# Patient Record
Sex: Male | Born: 1975
Health system: Southern US, Community
[De-identification: ages and names within clinical notes are randomized; demographics above are authoritative.]

## PROBLEM LIST (undated history)

## (undated) ENCOUNTER — Emergency Department (HOSPITAL_BASED_OUTPATIENT_CLINIC_OR_DEPARTMENT_OTHER): Admission: EM | Payer: 59 | Source: Home / Self Care

## (undated) DIAGNOSIS — I871 Compression of vein: Secondary | ICD-10-CM

## (undated) DIAGNOSIS — D0310 Melanoma in situ of unspecified eyelid, including canthus: Secondary | ICD-10-CM

## (undated) DIAGNOSIS — N2581 Secondary hyperparathyroidism of renal origin: Secondary | ICD-10-CM

## (undated) DIAGNOSIS — E785 Hyperlipidemia, unspecified: Secondary | ICD-10-CM

## (undated) DIAGNOSIS — I10 Essential (primary) hypertension: Secondary | ICD-10-CM

## (undated) DIAGNOSIS — M109 Gout, unspecified: Secondary | ICD-10-CM

## (undated) DIAGNOSIS — I671 Cerebral aneurysm, nonruptured: Secondary | ICD-10-CM

## (undated) DIAGNOSIS — S065XAA Traumatic subdural hemorrhage with loss of consciousness status unknown, initial encounter: Secondary | ICD-10-CM

## (undated) DIAGNOSIS — E786 Lipoprotein deficiency: Secondary | ICD-10-CM

## (undated) DIAGNOSIS — I6201 Nontraumatic acute subdural hemorrhage: Secondary | ICD-10-CM

## (undated) DIAGNOSIS — D630 Anemia in neoplastic disease: Secondary | ICD-10-CM

## (undated) DIAGNOSIS — J189 Pneumonia, unspecified organism: Secondary | ICD-10-CM

## (undated) DIAGNOSIS — K219 Gastro-esophageal reflux disease without esophagitis: Secondary | ICD-10-CM

## (undated) DIAGNOSIS — E559 Vitamin D deficiency, unspecified: Secondary | ICD-10-CM

## (undated) DIAGNOSIS — D509 Iron deficiency anemia, unspecified: Secondary | ICD-10-CM

## (undated) DIAGNOSIS — Q613 Polycystic kidney, unspecified: Secondary | ICD-10-CM

## (undated) DIAGNOSIS — L299 Pruritus, unspecified: Secondary | ICD-10-CM

## (undated) DIAGNOSIS — Z9289 Personal history of other medical treatment: Secondary | ICD-10-CM

## (undated) DIAGNOSIS — N189 Chronic kidney disease, unspecified: Secondary | ICD-10-CM

## (undated) DIAGNOSIS — E669 Obesity, unspecified: Secondary | ICD-10-CM

## (undated) DIAGNOSIS — G4730 Sleep apnea, unspecified: Secondary | ICD-10-CM

## (undated) DIAGNOSIS — G4733 Obstructive sleep apnea (adult) (pediatric): Secondary | ICD-10-CM

## (undated) HISTORY — DX: Anemia in neoplastic disease: D63.0

## (undated) HISTORY — DX: Pruritus, unspecified: L29.9

## (undated) HISTORY — DX: Secondary hyperparathyroidism of renal origin: N25.81

## (undated) HISTORY — DX: Iron deficiency anemia, unspecified: D50.9

## (undated) HISTORY — DX: Nontraumatic acute subdural hemorrhage: I62.01

## (undated) HISTORY — DX: Chronic kidney disease, unspecified: N18.9

## (undated) HISTORY — DX: Disorder of phosphorus metabolism, unspecified: E83.30

## (undated) HISTORY — DX: Melanoma in situ of unspecified eyelid, including canthus: D03.10

## (undated) HISTORY — DX: Essential (primary) hypertension: I10

## (undated) HISTORY — DX: Obesity, unspecified: E66.9

## (undated) HISTORY — DX: Lipoprotein deficiency: E78.6

## (undated) HISTORY — DX: Compression of vein: I87.1

---
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
## 2006-12-06 ENCOUNTER — Emergency Department (HOSPITAL_COMMUNITY): Admission: EM | Admit: 2006-12-06 | Discharge: 2006-12-06 | Payer: Self-pay | Admitting: Emergency Medicine

## 2007-02-04 ENCOUNTER — Ambulatory Visit: Payer: Self-pay | Admitting: Internal Medicine

## 2007-04-17 ENCOUNTER — Ambulatory Visit: Payer: Self-pay | Admitting: *Deleted

## 2008-04-07 ENCOUNTER — Ambulatory Visit: Payer: Self-pay | Admitting: Internal Medicine

## 2008-05-16 ENCOUNTER — Emergency Department (HOSPITAL_COMMUNITY): Admission: EM | Admit: 2008-05-16 | Discharge: 2008-05-16 | Payer: Self-pay | Admitting: Emergency Medicine

## 2008-10-26 ENCOUNTER — Emergency Department (HOSPITAL_COMMUNITY): Admission: EM | Admit: 2008-10-26 | Discharge: 2008-10-26 | Payer: Self-pay | Admitting: Emergency Medicine

## 2008-12-28 ENCOUNTER — Encounter (INDEPENDENT_AMBULATORY_CARE_PROVIDER_SITE_OTHER): Payer: Self-pay | Admitting: Adult Health

## 2008-12-28 ENCOUNTER — Ambulatory Visit: Payer: Self-pay | Admitting: Internal Medicine

## 2008-12-28 LAB — CONVERTED CEMR LAB
ALT: 28 units/L (ref 0–53)
AST: 27 units/L (ref 0–37)
Albumin: 4.5 g/dL (ref 3.5–5.2)
Alkaline Phosphatase: 56 units/L (ref 39–117)
BUN: 21 mg/dL (ref 6–23)
CO2: 19 meq/L (ref 19–32)
Calcium: 9.7 mg/dL (ref 8.4–10.5)
Chloride: 106 meq/L (ref 96–112)
Creatinine, Ser: 1.11 mg/dL (ref 0.40–1.50)
Glucose, Bld: 90 mg/dL (ref 70–99)
Microalb, Ur: 3.66 mg/dL — ABNORMAL HIGH (ref 0.00–1.89)
Potassium: 4.3 meq/L (ref 3.5–5.3)
Sodium: 138 meq/L (ref 135–145)
Total Bilirubin: 0.4 mg/dL (ref 0.3–1.2)
Total Protein: 7.6 g/dL (ref 6.0–8.3)

## 2009-04-04 ENCOUNTER — Encounter (INDEPENDENT_AMBULATORY_CARE_PROVIDER_SITE_OTHER): Payer: Self-pay | Admitting: Adult Health

## 2009-04-04 ENCOUNTER — Ambulatory Visit: Payer: Self-pay | Admitting: Internal Medicine

## 2009-04-04 LAB — CONVERTED CEMR LAB
ALT: 68 units/L — ABNORMAL HIGH (ref 0–53)
AST: 35 units/L (ref 0–37)
Albumin: 4.3 g/dL (ref 3.5–5.2)
Alkaline Phosphatase: 55 units/L (ref 39–117)
BUN: 16 mg/dL (ref 6–23)
CO2: 23 meq/L (ref 19–32)
Calcium: 9.3 mg/dL (ref 8.4–10.5)
Chloride: 103 meq/L (ref 96–112)
Cholesterol: 223 mg/dL — ABNORMAL HIGH (ref 0–200)
Creatinine, Ser: 1.14 mg/dL (ref 0.40–1.50)
Glucose, Bld: 98 mg/dL (ref 70–99)
HDL: 41 mg/dL (ref >39)
LDL Cholesterol: 121 mg/dL — ABNORMAL HIGH (ref 0–99)
Potassium: 4.6 meq/L (ref 3.5–5.3)
Sodium: 137 meq/L (ref 135–145)
Total Bilirubin: 0.5 mg/dL (ref 0.3–1.2)
Total CHOL/HDL Ratio: 5.4
Total Protein: 7.6 g/dL (ref 6.0–8.3)
Triglycerides: 303 mg/dL — ABNORMAL HIGH (ref <150)
VLDL: 61 mg/dL — ABNORMAL HIGH (ref 0–40)

## 2009-05-23 ENCOUNTER — Encounter (INDEPENDENT_AMBULATORY_CARE_PROVIDER_SITE_OTHER): Payer: Self-pay | Admitting: Adult Health

## 2009-05-23 ENCOUNTER — Ambulatory Visit: Payer: Self-pay | Admitting: Internal Medicine

## 2009-05-23 LAB — CONVERTED CEMR LAB
Chlamydia, Swab/Urine, PCR: NEGATIVE
GC Probe Amp, Urine: NEGATIVE

## 2009-05-24 ENCOUNTER — Encounter (INDEPENDENT_AMBULATORY_CARE_PROVIDER_SITE_OTHER): Payer: Self-pay | Admitting: Adult Health

## 2009-07-26 ENCOUNTER — Encounter (INDEPENDENT_AMBULATORY_CARE_PROVIDER_SITE_OTHER): Payer: Self-pay | Admitting: Adult Health

## 2009-07-26 ENCOUNTER — Ambulatory Visit: Payer: Self-pay | Admitting: Family Medicine

## 2009-07-26 LAB — CONVERTED CEMR LAB
ALT: 32 units/L (ref 0–53)
AST: 25 units/L (ref 0–37)
Albumin: 4.6 g/dL (ref 3.5–5.2)
Alkaline Phosphatase: 54 units/L (ref 39–117)
BUN: 21 mg/dL (ref 6–23)
CO2: 22 meq/L (ref 19–32)
Calcium: 9.1 mg/dL (ref 8.4–10.5)
Chloride: 106 meq/L (ref 96–112)
Cholesterol: 204 mg/dL — ABNORMAL HIGH (ref 0–200)
Creatinine, Ser: 1.4 mg/dL (ref 0.40–1.50)
Glucose, Bld: 98 mg/dL (ref 70–99)
HDL: 34 mg/dL — ABNORMAL LOW (ref >39)
LDL Cholesterol: 105 mg/dL — ABNORMAL HIGH (ref 0–99)
Potassium: 4.2 meq/L (ref 3.5–5.3)
Sodium: 138 meq/L (ref 135–145)
Total Bilirubin: 0.2 mg/dL — ABNORMAL LOW (ref 0.3–1.2)
Total CHOL/HDL Ratio: 6
Total Protein: 7.6 g/dL (ref 6.0–8.3)
Triglycerides: 324 mg/dL — ABNORMAL HIGH (ref <150)
VLDL: 65 mg/dL — ABNORMAL HIGH (ref 0–40)

## 2009-11-29 ENCOUNTER — Emergency Department (HOSPITAL_COMMUNITY): Admission: EM | Admit: 2009-11-29 | Discharge: 2009-11-29 | Payer: Self-pay | Admitting: Emergency Medicine

## 2009-12-01 ENCOUNTER — Emergency Department (HOSPITAL_COMMUNITY): Admission: EM | Admit: 2009-12-01 | Discharge: 2009-12-02 | Payer: Self-pay | Admitting: Emergency Medicine

## 2009-12-05 ENCOUNTER — Inpatient Hospital Stay (HOSPITAL_COMMUNITY): Admission: EM | Admit: 2009-12-05 | Discharge: 2009-12-09 | Payer: Self-pay | Admitting: Emergency Medicine

## 2009-12-19 ENCOUNTER — Encounter (INDEPENDENT_AMBULATORY_CARE_PROVIDER_SITE_OTHER): Payer: Self-pay | Admitting: *Deleted

## 2009-12-19 LAB — CONVERTED CEMR LAB
ALT: 51 units/L (ref 0–53)
AST: 21 units/L (ref 0–37)
Albumin: 4.1 g/dL (ref 3.5–5.2)
Alkaline Phosphatase: 72 units/L (ref 39–117)
BUN: 18 mg/dL (ref 6–23)
Basophils Absolute: 0.1 10*3/uL (ref 0.0–0.1)
Basophils Relative: 1 % (ref 0–1)
CO2: 26 meq/L (ref 19–32)
Calcium: 9.7 mg/dL (ref 8.4–10.5)
Chloride: 103 meq/L (ref 96–112)
Creatinine, Ser: 1.32 mg/dL (ref 0.40–1.50)
Eosinophils Absolute: 0.3 10*3/uL (ref 0.0–0.7)
Eosinophils Relative: 3 % (ref 0–5)
Glucose, Bld: 80 mg/dL (ref 70–99)
HCT: 38.1 % — ABNORMAL LOW (ref 39.0–52.0)
Hemoglobin: 12.2 g/dL — ABNORMAL LOW (ref 13.0–17.0)
Lymphocytes Relative: 33 % (ref 12–46)
Lymphs Abs: 3.4 10*3/uL (ref 0.7–4.0)
MCHC: 32 g/dL (ref 30.0–36.0)
MCV: 86.8 fL (ref 78.0–100.0)
Monocytes Absolute: 0.6 10*3/uL (ref 0.1–1.0)
Monocytes Relative: 6 % (ref 3–12)
Neutro Abs: 6 10*3/uL (ref 1.7–7.7)
Neutrophils Relative %: 58 % (ref 43–77)
Platelets: 618 10*3/uL — ABNORMAL HIGH (ref 150–400)
Potassium: 4.4 meq/L (ref 3.5–5.3)
RBC: 4.39 M/uL (ref 4.22–5.81)
RDW: 14.5 % (ref 11.5–15.5)
Sodium: 140 meq/L (ref 135–145)
Total Bilirubin: 0.5 mg/dL (ref 0.3–1.2)
Total Protein: 7.7 g/dL (ref 6.0–8.3)
WBC: 10.3 10*3/uL (ref 4.0–10.5)

## 2010-06-01 LAB — DIFFERENTIAL
Basophils Absolute: 0 10*3/uL (ref 0.0–0.1)
Basophils Absolute: 0.3 10*3/uL — ABNORMAL HIGH (ref 0.0–0.1)
Basophils Absolute: 0 10*3/uL (ref 0.0–0.1)
Basophils Relative: 0 % (ref 0–1)
Basophils Relative: 1 % (ref 0–1)
Basophils Relative: 0 % (ref 0–1)
Eosinophils Absolute: 0 10*3/uL (ref 0.0–0.7)
Eosinophils Absolute: 0.1 10*3/uL (ref 0.0–0.7)
Eosinophils Absolute: 0.1 10*3/uL (ref 0.0–0.7)
Eosinophils Relative: 0 % (ref 0–5)
Eosinophils Relative: 1 % (ref 0–5)
Eosinophils Relative: 1 % (ref 0–5)
Lymphocytes Relative: 13 % (ref 12–46)
Lymphocytes Relative: 26 % (ref 12–46)
Lymphocytes Relative: 15 % (ref 12–46)
Lymphs Abs: 2.6 10*3/uL (ref 0.7–4.0)
Lymphs Abs: 4.1 10*3/uL — ABNORMAL HIGH (ref 0.7–4.0)
Lymphs Abs: 1.8 10*3/uL (ref 0.7–4.0)
Monocytes Absolute: 1.4 10*3/uL — ABNORMAL HIGH (ref 0.1–1.0)
Monocytes Absolute: 1.8 10*3/uL — ABNORMAL HIGH (ref 0.1–1.0)
Monocytes Absolute: 0.8 10*3/uL (ref 0.1–1.0)
Monocytes Relative: 9 % (ref 3–12)
Monocytes Relative: 9 % (ref 3–12)
Monocytes Relative: 6 % (ref 3–12)
Neutro Abs: 10.2 10*3/uL — ABNORMAL HIGH (ref 1.7–7.7)
Neutro Abs: 15.7 10*3/uL — ABNORMAL HIGH (ref 1.7–7.7)
Neutro Abs: 9.1 10*3/uL — ABNORMAL HIGH (ref 1.7–7.7)
Neutrophils Relative %: 64 % (ref 43–77)
Neutrophils Relative %: 77 % (ref 43–77)
Neutrophils Relative %: 77 % (ref 43–77)

## 2010-06-01 LAB — COMPREHENSIVE METABOLIC PANEL
ALT: 182 U/L — ABNORMAL HIGH (ref 0–53)
ALT: 34 U/L (ref 0–53)
ALT: 64 U/L — ABNORMAL HIGH (ref 0–53)
ALT: 69 U/L — ABNORMAL HIGH (ref 0–53)
ALT: 76 U/L — ABNORMAL HIGH (ref 0–53)
ALT: 98 U/L — ABNORMAL HIGH (ref 0–53)
ALT: 23 U/L (ref 0–53)
AST: 172 U/L — ABNORMAL HIGH (ref 0–37)
AST: 34 U/L (ref 0–37)
AST: 52 U/L — ABNORMAL HIGH (ref 0–37)
AST: 59 U/L — ABNORMAL HIGH (ref 0–37)
AST: 61 U/L — ABNORMAL HIGH (ref 0–37)
AST: 99 U/L — ABNORMAL HIGH (ref 0–37)
AST: 21 U/L (ref 0–37)
Albumin: 2.6 g/dL — ABNORMAL LOW (ref 3.5–5.2)
Albumin: 2.6 g/dL — ABNORMAL LOW (ref 3.5–5.2)
Albumin: 2.7 g/dL — ABNORMAL LOW (ref 3.5–5.2)
Albumin: 2.8 g/dL — ABNORMAL LOW (ref 3.5–5.2)
Albumin: 3.3 g/dL — ABNORMAL LOW (ref 3.5–5.2)
Albumin: 3.5 g/dL (ref 3.5–5.2)
Albumin: 3.8 g/dL (ref 3.5–5.2)
Alkaline Phosphatase: 121 U/L — ABNORMAL HIGH (ref 39–117)
Alkaline Phosphatase: 128 U/L — ABNORMAL HIGH (ref 39–117)
Alkaline Phosphatase: 71 U/L (ref 39–117)
Alkaline Phosphatase: 84 U/L (ref 39–117)
Alkaline Phosphatase: 93 U/L (ref 39–117)
Alkaline Phosphatase: 99 U/L (ref 39–117)
Alkaline Phosphatase: 48 U/L (ref 39–117)
BUN: 11 mg/dL (ref 6–23)
BUN: 12 mg/dL (ref 6–23)
BUN: 13 mg/dL (ref 6–23)
BUN: 14 mg/dL (ref 6–23)
BUN: 15 mg/dL (ref 6–23)
BUN: 16 mg/dL (ref 6–23)
BUN: 14 mg/dL (ref 6–23)
CO2: 23 mEq/L (ref 19–32)
CO2: 25 mEq/L (ref 19–32)
CO2: 25 mEq/L (ref 19–32)
CO2: 25 mEq/L (ref 19–32)
CO2: 26 mEq/L (ref 19–32)
CO2: 26 mEq/L (ref 19–32)
CO2: 24 mEq/L (ref 19–32)
Calcium: 8.2 mg/dL — ABNORMAL LOW (ref 8.4–10.5)
Calcium: 8.2 mg/dL — ABNORMAL LOW (ref 8.4–10.5)
Calcium: 8.3 mg/dL — ABNORMAL LOW (ref 8.4–10.5)
Calcium: 8.7 mg/dL (ref 8.4–10.5)
Calcium: 8.9 mg/dL (ref 8.4–10.5)
Calcium: 9.2 mg/dL (ref 8.4–10.5)
Calcium: 8.9 mg/dL (ref 8.4–10.5)
Chloride: 101 mEq/L (ref 96–112)
Chloride: 103 mEq/L (ref 96–112)
Chloride: 103 mEq/L (ref 96–112)
Chloride: 104 mEq/L (ref 96–112)
Chloride: 97 mEq/L (ref 96–112)
Chloride: 98 mEq/L (ref 96–112)
Chloride: 104 mEq/L (ref 96–112)
Creatinine, Ser: 1.29 mg/dL (ref 0.4–1.5)
Creatinine, Ser: 1.29 mg/dL (ref 0.4–1.5)
Creatinine, Ser: 1.36 mg/dL (ref 0.4–1.5)
Creatinine, Ser: 1.55 mg/dL — ABNORMAL HIGH (ref 0.4–1.5)
Creatinine, Ser: 1.9 mg/dL — ABNORMAL HIGH (ref 0.4–1.5)
Creatinine, Ser: 1.96 mg/dL — ABNORMAL HIGH (ref 0.4–1.5)
Creatinine, Ser: 1.24 mg/dL (ref 0.4–1.5)
GFR calc Af Amer: 48 mL/min — ABNORMAL LOW (ref >60)
GFR calc Af Amer: 49 mL/min — ABNORMAL LOW (ref >60)
GFR calc Af Amer: >60 mL/min (ref >60)
GFR calc Af Amer: >60 mL/min (ref >60)
GFR calc Af Amer: >60 mL/min (ref >60)
GFR calc Af Amer: >60 mL/min (ref >60)
GFR calc Af Amer: >60 mL/min (ref >60)
GFR calc non Af Amer: 39 mL/min — ABNORMAL LOW (ref >60)
GFR calc non Af Amer: 41 mL/min — ABNORMAL LOW (ref >60)
GFR calc non Af Amer: 52 mL/min — ABNORMAL LOW (ref >60)
GFR calc non Af Amer: 60 mL/min — ABNORMAL LOW (ref >60)
GFR calc non Af Amer: >60 mL/min (ref >60)
GFR calc non Af Amer: >60 mL/min (ref >60)
GFR calc non Af Amer: >60 mL/min (ref >60)
Glucose, Bld: 112 mg/dL — ABNORMAL HIGH (ref 70–99)
Glucose, Bld: 114 mg/dL — ABNORMAL HIGH (ref 70–99)
Glucose, Bld: 133 mg/dL — ABNORMAL HIGH (ref 70–99)
Glucose, Bld: 85 mg/dL (ref 70–99)
Glucose, Bld: 91 mg/dL (ref 70–99)
Glucose, Bld: 97 mg/dL (ref 70–99)
Glucose, Bld: 103 mg/dL — ABNORMAL HIGH (ref 70–99)
Potassium: 3.9 mEq/L (ref 3.5–5.1)
Potassium: 4 mEq/L (ref 3.5–5.1)
Potassium: 4.1 mEq/L (ref 3.5–5.1)
Potassium: 4.2 mEq/L (ref 3.5–5.1)
Potassium: 4.7 mEq/L (ref 3.5–5.1)
Potassium: 4.9 mEq/L (ref 3.5–5.1)
Potassium: 4.9 mEq/L (ref 3.5–5.1)
Sodium: 132 mEq/L — ABNORMAL LOW (ref 135–145)
Sodium: 132 mEq/L — ABNORMAL LOW (ref 135–145)
Sodium: 134 mEq/L — ABNORMAL LOW (ref 135–145)
Sodium: 135 mEq/L (ref 135–145)
Sodium: 135 mEq/L (ref 135–145)
Sodium: 139 mEq/L (ref 135–145)
Sodium: 136 mEq/L (ref 135–145)
Total Bilirubin: 0.3 mg/dL (ref 0.3–1.2)
Total Bilirubin: 0.3 mg/dL (ref 0.3–1.2)
Total Bilirubin: 0.5 mg/dL (ref 0.3–1.2)
Total Bilirubin: 0.7 mg/dL (ref 0.3–1.2)
Total Bilirubin: 1 mg/dL (ref 0.3–1.2)
Total Bilirubin: 1 mg/dL (ref 0.3–1.2)
Total Bilirubin: 1.3 mg/dL — ABNORMAL HIGH (ref 0.3–1.2)
Total Protein: 6.9 g/dL (ref 6.0–8.3)
Total Protein: 7 g/dL (ref 6.0–8.3)
Total Protein: 7.1 g/dL (ref 6.0–8.3)
Total Protein: 7.4 g/dL (ref 6.0–8.3)
Total Protein: 7.7 g/dL (ref 6.0–8.3)
Total Protein: 8.1 g/dL (ref 6.0–8.3)
Total Protein: 7.3 g/dL (ref 6.0–8.3)

## 2010-06-01 LAB — HEPATITIS PANEL, ACUTE
HCV Ab: NEGATIVE
Hep A IgM: NEGATIVE
Hep B C IgM: NEGATIVE
Hepatitis B Surface Ag: NEGATIVE

## 2010-06-01 LAB — URINE MICROSCOPIC-ADD ON

## 2010-06-01 LAB — URINALYSIS, ROUTINE W REFLEX MICROSCOPIC
Bilirubin Urine: NEGATIVE
Bilirubin Urine: NEGATIVE
Bilirubin Urine: NEGATIVE
Bilirubin Urine: NEGATIVE
Glucose, UA: NEGATIVE mg/dL
Glucose, UA: NEGATIVE mg/dL
Glucose, UA: NEGATIVE mg/dL
Glucose, UA: NEGATIVE mg/dL
Ketones, ur: NEGATIVE mg/dL
Ketones, ur: NEGATIVE mg/dL
Ketones, ur: NEGATIVE mg/dL
Ketones, ur: NEGATIVE mg/dL
Leukocytes, UA: NEGATIVE
Leukocytes, UA: NEGATIVE
Leukocytes, UA: NEGATIVE
Nitrite: NEGATIVE
Nitrite: NEGATIVE
Nitrite: NEGATIVE
Nitrite: NEGATIVE
Protein, ur: 30 mg/dL — AB
Protein, ur: NEGATIVE mg/dL
Protein, ur: NEGATIVE mg/dL
Protein, ur: 30 mg/dL — AB
Specific Gravity, Urine: 1.016 (ref 1.005–1.030)
Specific Gravity, Urine: 1.017 (ref 1.005–1.030)
Specific Gravity, Urine: 1.018 (ref 1.005–1.030)
Specific Gravity, Urine: 1.023 (ref 1.005–1.030)
Urobilinogen, UA: 0.2 mg/dL (ref 0.0–1.0)
Urobilinogen, UA: 1 mg/dL (ref 0.0–1.0)
Urobilinogen, UA: 1 mg/dL (ref 0.0–1.0)
Urobilinogen, UA: 1 mg/dL (ref 0.0–1.0)
pH: 5.5 (ref 5.0–8.0)
pH: 6 (ref 5.0–8.0)
pH: 6.5 (ref 5.0–8.0)
pH: 6.5 (ref 5.0–8.0)

## 2010-06-01 LAB — HEPATIC FUNCTION PANEL
ALT: 72 U/L — ABNORMAL HIGH (ref 0–53)
AST: 60 U/L — ABNORMAL HIGH (ref 0–37)
Albumin: 3.1 g/dL — ABNORMAL LOW (ref 3.5–5.2)
Alkaline Phosphatase: 128 U/L — ABNORMAL HIGH (ref 39–117)
Bilirubin, Direct: 0.2 mg/dL (ref 0.0–0.3)
Indirect Bilirubin: 0.4 mg/dL (ref 0.3–0.9)
Total Bilirubin: 0.6 mg/dL (ref 0.3–1.2)
Total Protein: 7.6 g/dL (ref 6.0–8.3)

## 2010-06-01 LAB — URINE CULTURE
Colony Count: NO GROWTH
Culture  Setup Time: 201109191427
Culture: NO GROWTH

## 2010-06-01 LAB — CBC
HCT: 29.8 % — ABNORMAL LOW (ref 39.0–52.0)
HCT: 29.8 % — ABNORMAL LOW (ref 39.0–52.0)
HCT: 30 % — ABNORMAL LOW (ref 39.0–52.0)
HCT: 31.7 % — ABNORMAL LOW (ref 39.0–52.0)
HCT: 34.4 % — ABNORMAL LOW (ref 39.0–52.0)
HCT: 36.3 % — ABNORMAL LOW (ref 39.0–52.0)
HCT: 38.5 % — ABNORMAL LOW (ref 39.0–52.0)
Hemoglobin: 10 g/dL — ABNORMAL LOW (ref 13.0–17.0)
Hemoglobin: 10.1 g/dL — ABNORMAL LOW (ref 13.0–17.0)
Hemoglobin: 10.5 g/dL — ABNORMAL LOW (ref 13.0–17.0)
Hemoglobin: 10.7 g/dL — ABNORMAL LOW (ref 13.0–17.0)
Hemoglobin: 11.9 g/dL — ABNORMAL LOW (ref 13.0–17.0)
Hemoglobin: 12.7 g/dL — ABNORMAL LOW (ref 13.0–17.0)
Hemoglobin: 13.6 g/dL (ref 13.0–17.0)
MCH: 28.9 pg (ref 26.0–34.0)
MCH: 29.1 pg (ref 26.0–34.0)
MCH: 29.2 pg (ref 26.0–34.0)
MCH: 29.5 pg (ref 26.0–34.0)
MCH: 29.7 pg (ref 26.0–34.0)
MCH: 29.7 pg (ref 26.0–34.0)
MCH: 29.9 pg (ref 26.0–34.0)
MCHC: 33.5 g/dL (ref 30.0–36.0)
MCHC: 33.8 g/dL (ref 30.0–36.0)
MCHC: 33.8 g/dL (ref 30.0–36.0)
MCHC: 34.7 g/dL (ref 30.0–36.0)
MCHC: 34.9 g/dL (ref 30.0–36.0)
MCHC: 35 g/dL (ref 30.0–36.0)
MCHC: 34.8 g/dL (ref 30.0–36.0)
MCV: 84.2 fL (ref 78.0–100.0)
MCV: 85.2 fL (ref 78.0–100.0)
MCV: 85.6 fL (ref 78.0–100.0)
MCV: 86.2 fL (ref 78.0–100.0)
MCV: 86.3 fL (ref 78.0–100.0)
MCV: 86.5 fL (ref 78.0–100.0)
MCV: 82.4 fL (ref 78.0–100.0)
Platelets: 309 10*3/uL (ref 150–400)
Platelets: 335 10*3/uL (ref 150–400)
Platelets: 338 10*3/uL (ref 150–400)
Platelets: 382 10*3/uL (ref 150–400)
Platelets: 411 10*3/uL — ABNORMAL HIGH (ref 150–400)
Platelets: 414 10*3/uL — ABNORMAL HIGH (ref 150–400)
Platelets: 258 10*3/uL (ref 150–400)
RBC: 3.45 MIL/uL — ABNORMAL LOW (ref 4.22–5.81)
RBC: 3.46 MIL/uL — ABNORMAL LOW (ref 4.22–5.81)
RBC: 3.53 MIL/uL — ABNORMAL LOW (ref 4.22–5.81)
RBC: 3.68 MIL/uL — ABNORMAL LOW (ref 4.22–5.81)
RBC: 4.01 MIL/uL — ABNORMAL LOW (ref 4.22–5.81)
RBC: 4.31 MIL/uL (ref 4.22–5.81)
RBC: 4.67 MIL/uL (ref 4.22–5.81)
RDW: 13.3 % (ref 11.5–15.5)
RDW: 13.5 % (ref 11.5–15.5)
RDW: 13.6 % (ref 11.5–15.5)
RDW: 13.7 % (ref 11.5–15.5)
RDW: 13.8 % (ref 11.5–15.5)
RDW: 13.9 % (ref 11.5–15.5)
RDW: 13.6 % (ref 11.5–15.5)
WBC: 10.5 10*3/uL (ref 4.0–10.5)
WBC: 15.9 10*3/uL — ABNORMAL HIGH (ref 4.0–10.5)
WBC: 20.5 10*3/uL — ABNORMAL HIGH (ref 4.0–10.5)
WBC: 6.8 10*3/uL (ref 4.0–10.5)
WBC: 6.9 10*3/uL (ref 4.0–10.5)
WBC: 7.2 10*3/uL (ref 4.0–10.5)
WBC: 11.8 10*3/uL — ABNORMAL HIGH (ref 4.0–10.5)

## 2010-06-01 LAB — CULTURE, BLOOD (ROUTINE X 2)
Culture  Setup Time: 201109191422
Culture  Setup Time: 201109191422
Culture: NO GROWTH
Culture: NO GROWTH

## 2010-06-01 LAB — LIPASE, BLOOD
Lipase: 16 U/L (ref 11–59)
Lipase: 20 U/L (ref 11–59)

## 2010-06-01 LAB — LIPID PANEL
Cholesterol: 114 mg/dL (ref 0–200)
HDL: 16 mg/dL — ABNORMAL LOW (ref >39)
LDL Cholesterol: 80 mg/dL (ref 0–99)
Total CHOL/HDL Ratio: 7.1 RATIO
Triglycerides: 90 mg/dL (ref <150)
VLDL: 18 mg/dL (ref 0–40)

## 2010-06-08 ENCOUNTER — Encounter (INDEPENDENT_AMBULATORY_CARE_PROVIDER_SITE_OTHER): Payer: Self-pay | Admitting: *Deleted

## 2010-06-08 LAB — CONVERTED CEMR LAB
ALT: 15 units/L (ref 0–53)
AST: 20 units/L (ref 0–37)
Albumin: 4.4 g/dL (ref 3.5–5.2)
Alkaline Phosphatase: 52 units/L (ref 39–117)
BUN: 17 mg/dL (ref 6–23)
Basophils Absolute: 0 10*3/uL (ref 0.0–0.1)
Basophils Relative: 0 % (ref 0–1)
CO2: 20 meq/L (ref 19–32)
Calcium: 9.2 mg/dL (ref 8.4–10.5)
Chloride: 103 meq/L (ref 96–112)
Creatinine, Ser: 1.31 mg/dL (ref 0.40–1.50)
Eosinophils Absolute: 0.5 10*3/uL (ref 0.0–0.7)
Eosinophils Relative: 5 % (ref 0–5)
Glucose, Bld: 87 mg/dL (ref 70–99)
HCT: 41.6 % (ref 39.0–52.0)
Hemoglobin: 14 g/dL (ref 13.0–17.0)
Lymphocytes Relative: 38 % (ref 12–46)
Lymphs Abs: 3.8 10*3/uL (ref 0.7–4.0)
MCHC: 33.7 g/dL (ref 30.0–36.0)
MCV: 83.7 fL (ref 78.0–100.0)
Monocytes Absolute: 0.7 10*3/uL (ref 0.1–1.0)
Monocytes Relative: 7 % (ref 3–12)
Neutro Abs: 5 10*3/uL (ref 1.7–7.7)
Neutrophils Relative %: 49 % (ref 43–77)
Platelets: 313 10*3/uL (ref 150–400)
Potassium: 4.1 meq/L (ref 3.5–5.3)
RBC: 4.97 M/uL (ref 4.22–5.81)
RDW: 14.3 % (ref 11.5–15.5)
Sodium: 137 meq/L (ref 135–145)
Total Bilirubin: 0.3 mg/dL (ref 0.3–1.2)
Total Protein: 7.6 g/dL (ref 6.0–8.3)
WBC: 10.1 10*3/uL (ref 4.0–10.5)

## 2010-06-13 ENCOUNTER — Other Ambulatory Visit (HOSPITAL_COMMUNITY): Payer: Self-pay | Admitting: Family Medicine

## 2010-06-13 DIAGNOSIS — N289 Disorder of kidney and ureter, unspecified: Secondary | ICD-10-CM

## 2010-06-15 ENCOUNTER — Other Ambulatory Visit (HOSPITAL_COMMUNITY): Payer: Self-pay

## 2010-07-04 LAB — BASIC METABOLIC PANEL
BUN: 12 mg/dL (ref 6–23)
CO2: 20 mEq/L (ref 19–32)
Calcium: 9.6 mg/dL (ref 8.4–10.5)
Chloride: 106 mEq/L (ref 96–112)
Creatinine, Ser: 1.37 mg/dL (ref 0.4–1.5)
GFR calc Af Amer: >60 mL/min (ref >60)
GFR calc non Af Amer: >60 mL/min (ref >60)
Glucose, Bld: 115 mg/dL — ABNORMAL HIGH (ref 70–99)
Potassium: 3.8 mEq/L (ref 3.5–5.1)
Sodium: 139 mEq/L (ref 135–145)

## 2010-07-04 LAB — DIFFERENTIAL
Basophils Absolute: 0.1 10*3/uL (ref 0.0–0.1)
Basophils Relative: 1 % (ref 0–1)
Eosinophils Absolute: 0 10*3/uL (ref 0.0–0.7)
Eosinophils Relative: 0 % (ref 0–5)
Lymphocytes Relative: 29 % (ref 12–46)
Lymphs Abs: 3 10*3/uL (ref 0.7–4.0)
Monocytes Absolute: 0.4 10*3/uL (ref 0.1–1.0)
Monocytes Relative: 4 % (ref 3–12)
Neutro Abs: 6.9 10*3/uL (ref 1.7–7.7)
Neutrophils Relative %: 67 % (ref 43–77)

## 2010-07-04 LAB — CBC
HCT: 43.3 % (ref 39.0–52.0)
Hemoglobin: 14.4 g/dL (ref 13.0–17.0)
MCHC: 33.3 g/dL (ref 30.0–36.0)
MCV: 87.3 fL (ref 78.0–100.0)
Platelets: 347 10*3/uL (ref 150–400)
RBC: 4.96 MIL/uL (ref 4.22–5.81)
RDW: 13.2 % (ref 11.5–15.5)
WBC: 10.3 10*3/uL (ref 4.0–10.5)

## 2010-07-04 LAB — STREP A DNA PROBE: Group A Strep Probe: NEGATIVE

## 2010-07-04 LAB — APTT: aPTT: 30 seconds (ref 24–37)

## 2010-07-04 LAB — PROTIME-INR
INR: 1.2 (ref 0.00–1.49)
Prothrombin Time: 15.1 seconds (ref 11.6–15.2)

## 2010-07-13 ENCOUNTER — Other Ambulatory Visit (HOSPITAL_COMMUNITY): Payer: Self-pay

## 2010-07-31 ENCOUNTER — Inpatient Hospital Stay (HOSPITAL_COMMUNITY): Admission: RE | Admit: 2010-07-31 | Payer: Self-pay | Source: Ambulatory Visit

## 2010-08-07 ENCOUNTER — Emergency Department (HOSPITAL_COMMUNITY)
Admission: EM | Admit: 2010-08-07 | Discharge: 2010-08-08 | Disposition: A | Discharge status: Home / Self Care | Payer: Self-pay | Attending: Emergency Medicine | Admitting: Emergency Medicine

## 2010-08-07 DIAGNOSIS — J029 Acute pharyngitis, unspecified: Secondary | ICD-10-CM | POA: Insufficient documentation

## 2010-08-07 DIAGNOSIS — I10 Essential (primary) hypertension: Secondary | ICD-10-CM | POA: Insufficient documentation

## 2010-08-07 DIAGNOSIS — R599 Enlarged lymph nodes, unspecified: Secondary | ICD-10-CM | POA: Insufficient documentation

## 2010-08-07 LAB — RAPID STREP SCREEN (MED CTR MEBANE ONLY): Streptococcus, Group A Screen (Direct): NEGATIVE

## 2010-08-08 LAB — STREP A DNA PROBE: Group A Strep Probe: NEGATIVE

## 2010-08-21 ENCOUNTER — Other Ambulatory Visit (HOSPITAL_COMMUNITY): Payer: Self-pay

## 2010-08-29 ENCOUNTER — Emergency Department (HOSPITAL_COMMUNITY): Payer: Self-pay

## 2010-08-29 ENCOUNTER — Emergency Department (HOSPITAL_COMMUNITY)
Admission: EM | Admit: 2010-08-29 | Discharge: 2010-08-30 | Disposition: A | Discharge status: Home / Self Care | Payer: Self-pay | Attending: Emergency Medicine | Admitting: Emergency Medicine

## 2010-08-29 DIAGNOSIS — W268XXA Contact with other sharp object(s), not elsewhere classified, initial encounter: Secondary | ICD-10-CM | POA: Insufficient documentation

## 2010-08-29 DIAGNOSIS — S61509A Unspecified open wound of unspecified wrist, initial encounter: Secondary | ICD-10-CM | POA: Insufficient documentation

## 2010-08-29 DIAGNOSIS — IMO0002 Reserved for concepts with insufficient information to code with codable children: Secondary | ICD-10-CM | POA: Insufficient documentation

## 2010-08-29 DIAGNOSIS — I10 Essential (primary) hypertension: Secondary | ICD-10-CM | POA: Insufficient documentation

## 2010-08-29 DIAGNOSIS — S61209A Unspecified open wound of unspecified finger without damage to nail, initial encounter: Secondary | ICD-10-CM | POA: Insufficient documentation

## 2010-08-29 DIAGNOSIS — Y92009 Unspecified place in unspecified non-institutional (private) residence as the place of occurrence of the external cause: Secondary | ICD-10-CM | POA: Insufficient documentation

## 2010-08-29 DIAGNOSIS — S51809A Unspecified open wound of unspecified forearm, initial encounter: Secondary | ICD-10-CM | POA: Insufficient documentation

## 2010-08-29 DIAGNOSIS — Z23 Encounter for immunization: Secondary | ICD-10-CM | POA: Insufficient documentation

## 2010-09-02 ENCOUNTER — Emergency Department (HOSPITAL_COMMUNITY)
Admission: EM | Admit: 2010-09-02 | Discharge: 2010-09-02 | Disposition: A | Discharge status: Home / Self Care | Payer: Self-pay | Attending: Emergency Medicine | Admitting: Emergency Medicine

## 2010-09-02 DIAGNOSIS — Z4801 Encounter for change or removal of surgical wound dressing: Secondary | ICD-10-CM | POA: Insufficient documentation

## 2010-09-10 ENCOUNTER — Emergency Department (HOSPITAL_COMMUNITY)
Admission: EM | Admit: 2010-09-10 | Discharge: 2010-09-10 | Disposition: A | Discharge status: Home / Self Care | Payer: Self-pay | Attending: Emergency Medicine | Admitting: Emergency Medicine

## 2010-09-10 DIAGNOSIS — Z4802 Encounter for removal of sutures: Secondary | ICD-10-CM | POA: Insufficient documentation

## 2010-12-28 LAB — URINALYSIS, ROUTINE W REFLEX MICROSCOPIC
Bilirubin Urine: NEGATIVE
Glucose, UA: NEGATIVE
Hgb urine dipstick: NEGATIVE
Ketones, ur: NEGATIVE
Nitrite: NEGATIVE
Protein, ur: NEGATIVE
Specific Gravity, Urine: 1.021
Urobilinogen, UA: 1
pH: 6

## 2010-12-28 LAB — COMPREHENSIVE METABOLIC PANEL
ALT: 26
AST: 19
Albumin: 4
Alkaline Phosphatase: 42
BUN: 13
CO2: 25
Calcium: 9.6
Chloride: 106
Creatinine, Ser: 1.18
GFR calc Af Amer: >60
GFR calc non Af Amer: >60
Glucose, Bld: 94
Potassium: 4
Sodium: 139
Total Bilirubin: 0.9
Total Protein: 7.4

## 2010-12-28 LAB — DIFFERENTIAL
Basophils Absolute: 0.1
Basophils Relative: 1
Eosinophils Absolute: 0.2
Eosinophils Relative: 3
Lymphocytes Relative: 40
Lymphs Abs: 2.8
Monocytes Absolute: 0.5
Monocytes Relative: 8
Neutro Abs: 3.3
Neutrophils Relative %: 48

## 2010-12-28 LAB — I-STAT 8, (EC8 V) (CONVERTED LAB)
BUN: 13
Bicarbonate: 25.9 — ABNORMAL HIGH
Chloride: 107
Glucose, Bld: 93
HCT: 49
Hemoglobin: 16.7
Operator id: 198171
Potassium: 4.2
Sodium: 139
TCO2: 27
pCO2, Ven: 46.1
pH, Ven: 7.358 — ABNORMAL HIGH

## 2010-12-28 LAB — CBC
HCT: 46.4
Hemoglobin: 15.6
MCHC: 33.7
MCV: 88.3
Platelets: 282
RBC: 5.26
RDW: 13.3
WBC: 6.9

## 2010-12-28 LAB — RAPID URINE DRUG SCREEN, HOSP PERFORMED
Amphetamines: NOT DETECTED
Barbiturates: NOT DETECTED
Benzodiazepines: NOT DETECTED
Cocaine: NOT DETECTED
Opiates: NOT DETECTED
Tetrahydrocannabinol: NOT DETECTED

## 2010-12-28 LAB — POCT CARDIAC MARKERS
CKMB, poc: <1 — ABNORMAL LOW
Myoglobin, poc: 64.3
Operator id: 294501
Troponin i, poc: <0.05

## 2010-12-28 LAB — POCT I-STAT CREATININE
Creatinine, Ser: 1.2
Operator id: 198171

## 2013-07-30 ENCOUNTER — Encounter (HOSPITAL_COMMUNITY): Payer: Self-pay | Admitting: Emergency Medicine

## 2013-07-30 ENCOUNTER — Emergency Department (HOSPITAL_COMMUNITY)
Admission: EM | Admit: 2013-07-30 | Discharge: 2013-07-30 | Disposition: A | Discharge status: Home / Self Care | Payer: Self-pay | Attending: Emergency Medicine | Admitting: Emergency Medicine

## 2013-07-30 DIAGNOSIS — I10 Essential (primary) hypertension: Secondary | ICD-10-CM | POA: Insufficient documentation

## 2013-07-30 DIAGNOSIS — Z87891 Personal history of nicotine dependence: Secondary | ICD-10-CM | POA: Insufficient documentation

## 2013-07-30 DIAGNOSIS — B300 Keratoconjunctivitis due to adenovirus: Secondary | ICD-10-CM | POA: Insufficient documentation

## 2013-07-30 DIAGNOSIS — Z79899 Other long term (current) drug therapy: Secondary | ICD-10-CM | POA: Insufficient documentation

## 2013-07-30 HISTORY — DX: Essential (primary) hypertension: I10

## 2013-07-30 MED ORDER — TETRACAINE HCL 0.5 % OP SOLN
2.0000 [drp] | Freq: Once | OPHTHALMIC | Status: AC
  Administered 2013-07-30: 2 [drp] via OPHTHALMIC
  Filled 2013-07-30: qty 2

## 2013-07-30 MED ORDER — CYCLOPENTOLATE HCL 1 % OP SOLN
2.0000 [drp] | Freq: Once | OPHTHALMIC | Status: AC
  Administered 2013-07-30: 2 [drp] via OPHTHALMIC
  Filled 2013-07-30: qty 2

## 2013-07-30 MED ORDER — IBUPROFEN 800 MG PO TABS
800.0000 mg | ORAL_TABLET | Freq: Once | ORAL | Status: AC
  Administered 2013-07-30: 800 mg via ORAL
  Filled 2013-07-30: qty 1

## 2013-07-30 MED ORDER — OXYCODONE-ACETAMINOPHEN 5-325 MG PO TABS: 1.0000 | ORAL_TABLET | ORAL | Status: DC | PRN

## 2013-07-30 MED ORDER — FLUORESCEIN SODIUM 1 MG OP STRP
1.0000 | ORAL_STRIP | Freq: Once | OPHTHALMIC | Status: AC
  Administered 2013-07-30: 10:00:00 via OPHTHALMIC
  Filled 2013-07-30: qty 1

## 2013-07-30 NOTE — Discharge Instructions (Signed)
Viral Conjunctivitis Conjunctivitis is an irritation (inflammation) of the clear membrane that covers the white part of the eye (the conjunctiva). The irritation can also happen on the underside of the eyelids. Conjunctivitis makes the eye red or pink in color. This is what is commonly known as pink eye. Viral conjunctivitis can spread easily (contagious). CAUSES   Infection from virus on the surface of the eye.  Infection from the irritation or injury of nearby tissues such as the eyelids or cornea.  More serious inflammation or infection on the inside of the eye.  Other eye diseases.  The use of certain eye medications. SYMPTOMS  The normally white color of the eye or the underside of the eyelid is usually pink or red in color. The pink eye is usually associated with irritation, tearing and some sensitivity to light. Viral conjunctivitis is often associated with a clear, watery discharge. If a discharge is present, there may also be some blurred vision in the affected eye. DIAGNOSIS  Conjunctivitis is diagnosed by an eye exam. The eye specialist looks for changes in the surface tissues of the eye which take on changes characteristic of the specific types of conjunctivitis. A sample of any discharge may be collected on a Q-Tip (sterile swap). The sample will be sent to a lab to see whether or not the inflammation is caused by bacterial or viral infection. TREATMENT  Viral conjunctivitis will not respond to medicines that kill germs (antibiotics). Treatment is aimed at stopping a bacterial infection on top of the viral infection. The goal of treatment is to relieve symptoms (such as itching) with antihistamine drops or other eye medications.  HOME CARE INSTRUCTIONS   To ease discomfort, apply a cool, clean wash cloth to your eye for 10 to 20 minutes, 3 to 4 times a day.  Gently wipe away any drainage from the eye with a warm, wet washcloth or a cotton ball.  Wash your hands often with soap  and use paper towels to dry.  Do not share towels or washcloths. This may spread the infection.  Change or wash your pillowcase every day.  You should not use eye make-up until the infection is gone.  Stop using contacts lenses. Ask your eye professional how to sterilize or replace them before using again. This depends on the type of contact lenses used.  Do not touch the edge of the eyelid with the eye drop bottle or ointment tube when applying medications to the affected eye. This will stop you from spreading the infection to the other eye or to others. SEEK IMMEDIATE MEDICAL CARE IF:   The infection has not improved within 3 days of beginning treatment.  A watery discharge from the eye develops.  Pain in the eye increases.  The redness is spreading.  Vision becomes blurred.  An oral temperature above 102 F (38.9 C) develops, or as your caregiver suggests.  Facial pain, redness or swelling develops.  Any problems that may be related to the prescribed medicine develop. MAKE SURE YOU:   Understand these instructions.  Will watch your condition.  Will get help right away if you are not doing well or get worse. Document Released: 03/05/2005 Document Revised: 05/28/2011 Document Reviewed: 10/23/2007 [facility] Patient Information 2014 [place].

## 2013-07-30 NOTE — Progress Notes (Signed)
[facility] provided pt with a list of primary care resources to help patient establish primary care.  °

## 2013-07-30 NOTE — ED Notes (Addendum)
Pt c/o increasing bilateral eye, redness, pain, and drainage x 4 days.   Pain score 7/10.  Drainage and red sclera noted.  Pt report that he thinks it's from pollen.

## 2013-08-07 NOTE — ED Provider Notes (Signed)
CSN: [id number]     Arrival date & time 07/30/13  [id number] History   First MD Initiated Contact with Patient 07/30/13 0940     Chief Complaint  Patient presents with  . Eye Drainage     (Consider location/radiation/quality/duration/timing/severity/associated sxs/prior Treatment) HPI  38 year old male with bilateral eye pain, redness, tearing and photophobia. Gradual onset approximately 4 days ago. Denies trauma. Progressively worsening. Has been taking Visine for what he suspected was seasonal allergies without any relief. No fevers or chills. Intermittent blurred vision. Does wear contacts. No headaches. No contacts with similar symptoms. No personal history of similar symptoms.   Past Medical History  Diagnosis Date  . Hypertension    History reviewed. No pertinent past surgical history. History reviewed. No pertinent family history. History  Substance Use Topics  . Smoking status: Former Research scientist (life sciences)  . Smokeless tobacco: Not on file  . Alcohol Use: Yes     Comment: occ    Review of Systems  All systems reviewed and negative, other than as noted in HPI.   Allergies  Shellfish allergy  Home Medications   Prior to Admission medications   Medication Sig Start Date End Date Taking? Authorizing Provider  diphenhydrAMINE (BENADRYL) 25 mg capsule Take 50 mg by mouth every 4 (four) hours as needed for allergies.   Yes Historical Provider, MD  Garcinia Cambogia-Chromium 500-200 MG-MCG TABS Take 1 tablet by mouth daily.   Yes Historical Provider, MD  tetrahydrozoline (VISINE) 0.05 % ophthalmic solution Place 2 drops into both eyes every 2 (two) hours as needed (for allergies).   Yes Historical Provider, MD  oxyCODONE-acetaminophen (PERCOCET/ROXICET) 5-325 MG per tablet Take 1-2 tablets by mouth every 4 (four) hours as needed for severe pain. 07/30/13   Virgel Manifold, MD   BP 167/106  Pulse 64  Temp(Src) 99 F (37.2 C) (Oral)  Resp 16  SpO2 100% Physical Exam  Nursing note and  vitals reviewed. Constitutional: He appears well-developed and well-nourished. No distress.  HENT:  Head: Normocephalic and atraumatic.  Eyes: Right eye exhibits no discharge. Left eye exhibits no discharge.  Intense injection of bilateral conjunctiva. Tearing of both eyes. Photophobia. Pupils are approximately 3 mm and reactive bilaterally. Anterior chambers are clear. No focal uptake of fluorescein noted. Intraocular pressures are normal.  Neck: Neck supple.  Cardiovascular: Normal rate, regular rhythm and normal heart sounds.  Exam reveals no gallop and no friction rub.   No murmur heard. Pulmonary/Chest: Effort normal and breath sounds normal. No respiratory distress.  Abdominal: Soft. He exhibits no distension. There is no tenderness.  Musculoskeletal: He exhibits no edema and no tenderness.  Neurological: He is alert.  Skin: Skin is warm and dry.  Psychiatric: He has a normal mood and affect. His behavior is normal. Thought content normal.    ED Course  Procedures (including critical care time) Labs Review Labs Reviewed - No data to display  Imaging Review No results found.   EKG Interpretation None      MDM   Final diagnoses:  Epidemic keratoconjunctivitis    37yM with b/l eye pain, conjunctivitis. Doubt seasonal allergies with the degree of his symptoms.  Suspect epidemic keratoconjunctivitis. No focal uptake of fluorescein on slit lamp exam. Pt does wear contacts. Instructed to throw away current pair. Is not to wear them again until at least 1 week completely symptom free or cleared by ophthalmologist. Cool compresses. Cycloplegia. Antihistamines PRN.     Virgel Manifold, MD 08/07/13 [phone]

## 2014-08-26 ENCOUNTER — Encounter (HOSPITAL_COMMUNITY): Payer: Self-pay

## 2014-08-26 ENCOUNTER — Emergency Department (HOSPITAL_COMMUNITY)
Admission: EM | Admit: 2014-08-26 | Discharge: 2014-08-26 | Disposition: A | Discharge status: Home / Self Care | Payer: No Typology Code available for payment source | Attending: Emergency Medicine | Admitting: Emergency Medicine

## 2014-08-26 DIAGNOSIS — Z87891 Personal history of nicotine dependence: Secondary | ICD-10-CM | POA: Insufficient documentation

## 2014-08-26 DIAGNOSIS — I10 Essential (primary) hypertension: Secondary | ICD-10-CM | POA: Diagnosis not present

## 2014-08-26 DIAGNOSIS — Y929 Unspecified place or not applicable: Secondary | ICD-10-CM | POA: Insufficient documentation

## 2014-08-26 DIAGNOSIS — Y939 Activity, unspecified: Secondary | ICD-10-CM | POA: Diagnosis not present

## 2014-08-26 DIAGNOSIS — X58XXXA Exposure to other specified factors, initial encounter: Secondary | ICD-10-CM | POA: Diagnosis not present

## 2014-08-26 DIAGNOSIS — M25472 Effusion, left ankle: Secondary | ICD-10-CM | POA: Insufficient documentation

## 2014-08-26 DIAGNOSIS — Z79899 Other long term (current) drug therapy: Secondary | ICD-10-CM | POA: Diagnosis not present

## 2014-08-26 DIAGNOSIS — N19 Unspecified kidney failure: Secondary | ICD-10-CM | POA: Diagnosis not present

## 2014-08-26 DIAGNOSIS — M109 Gout, unspecified: Secondary | ICD-10-CM | POA: Insufficient documentation

## 2014-08-26 DIAGNOSIS — M79662 Pain in left lower leg: Secondary | ICD-10-CM | POA: Diagnosis present

## 2014-08-26 DIAGNOSIS — S90812A Abrasion, left foot, initial encounter: Secondary | ICD-10-CM | POA: Diagnosis not present

## 2014-08-26 DIAGNOSIS — Y999 Unspecified external cause status: Secondary | ICD-10-CM | POA: Insufficient documentation

## 2014-08-26 LAB — CBC
HCT: 42 % (ref 39.0–52.0)
Hemoglobin: 13.7 g/dL (ref 13.0–17.0)
MCH: 28 pg (ref 26.0–34.0)
MCHC: 32.6 g/dL (ref 30.0–36.0)
MCV: 85.7 fL (ref 78.0–100.0)
Platelets: 281 10*3/uL (ref 150–400)
RBC: 4.9 MIL/uL (ref 4.22–5.81)
RDW: 14.3 % (ref 11.5–15.5)
WBC: 13.5 10*3/uL — ABNORMAL HIGH (ref 4.0–10.5)

## 2014-08-26 LAB — I-STAT CHEM 8, ED
BUN: 23 mg/dL — ABNORMAL HIGH (ref 6–20)
Calcium, Ion: 1.18 mmol/L (ref 1.12–1.23)
Chloride: 106 mmol/L (ref 101–111)
Creatinine, Ser: 2.1 mg/dL — ABNORMAL HIGH (ref 0.61–1.24)
Glucose, Bld: 109 mg/dL — ABNORMAL HIGH (ref 65–99)
HCT: 45 % (ref 39.0–52.0)
Hemoglobin: 15.3 g/dL (ref 13.0–17.0)
Potassium: 4.6 mmol/L (ref 3.5–5.1)
Sodium: 140 mmol/L (ref 135–145)
TCO2: 22 mmol/L (ref 0–100)

## 2014-08-26 LAB — D-DIMER, QUANTITATIVE: D-Dimer, Quant: <0.27 ug/mL-FEU (ref 0.00–0.48)

## 2014-08-26 MED ORDER — COLCHICINE 0.6 MG PO TABS
1.2000 mg | ORAL_TABLET | Freq: Once | ORAL | Status: AC
  Administered 2014-08-26: 1.2 mg via ORAL
  Filled 2014-08-26: qty 2

## 2014-08-26 MED ORDER — OXYCODONE-ACETAMINOPHEN 5-325 MG PO TABS: 1.0000 | ORAL_TABLET | ORAL | Status: DC | PRN

## 2014-08-26 MED ORDER — HYDROMORPHONE HCL 1 MG/ML IJ SOLN
1.0000 mg | Freq: Once | INTRAMUSCULAR | Status: DC
Start: 2014-08-26 — End: 2014-08-27

## 2014-08-26 MED ORDER — COLCHICINE 0.6 MG PO TABS: ORAL_TABLET | ORAL | Status: DC

## 2014-08-26 MED ORDER — HYDROMORPHONE HCL 1 MG/ML IJ SOLN
1.0000 mg | Freq: Once | INTRAMUSCULAR | Status: AC
  Administered 2014-08-26: 1 mg via INTRAMUSCULAR
  Filled 2014-08-26: qty 1

## 2014-08-26 MED ORDER — METOPROLOL TARTRATE 25 MG PO TABS: 25.0000 mg | ORAL_TABLET | Freq: Two times a day (BID) | ORAL | Status: DC

## 2014-08-26 MED ORDER — OXYCODONE-ACETAMINOPHEN 5-325 MG PO TABS
2.0000 | ORAL_TABLET | Freq: Once | ORAL | Status: AC
  Administered 2014-08-26: 2 via ORAL
  Filled 2014-08-26: qty 2

## 2014-08-26 NOTE — ED Notes (Signed)
Pt complains of left lower leg pain since yesterday, his left calf feels tight and he's unable to move his foot up and down

## 2014-08-26 NOTE — ED Provider Notes (Signed)
CSN: [id number]     Arrival date & time 08/26/14  1958 History   First MD Initiated Contact with Patient 08/26/14 2018     Chief Complaint  Patient presents with  . Leg Pain     (Consider location/radiation/quality/duration/timing/severity/associated sxs/prior Treatment) HPI Comments: The patient is a 39 year old male, he has a history of hypertension, states that he has not been taking medications for several years after he stopped going [facility]. He states that yesterday he started to develop left lower leg pain at the ankle and foot. He states that his calf feels tight in his Achilles tendon feels tight, it is very hard to walk because of the pain, he has difficulty with range of motion of the knee and the ankle on the left, he has had similar symptoms in the past with the great toe on the right. These painful episodes come and go without intervention and he has not gone to medical evaluation for them. He denies fevers chills nausea vomiting chest pain or shortness of breath, he has no other risk factors for pulmonary embolism or venous thromboembolism.  Patient is a 39 y.o. male presenting with leg pain. The history is provided by the patient and the spouse.  Leg Pain   Past Medical History  Diagnosis Date  . Hypertension    History reviewed. No pertinent past surgical history. History reviewed. No pertinent family history. History  Substance Use Topics  . Smoking status: Former Research scientist (life sciences)  . Smokeless tobacco: Not on file  . Alcohol Use: Yes     Comment: occ    Review of Systems  All other systems reviewed and are negative.     Allergies  Shellfish allergy  Home Medications   Prior to Admission medications   Medication Sig Start Date End Date Taking? Authorizing Provider  diphenhydrAMINE (BENADRYL) 25 mg capsule Take 50 mg by mouth every 4 (four) hours as needed for allergies.   Yes Historical Provider, MD  ibuprofen (ADVIL,MOTRIN) 200 MG tablet Take 400 mg by  mouth every 6 (six) hours as needed for moderate pain.   Yes Historical Provider, MD  tetrahydrozoline (VISINE) 0.05 % ophthalmic solution Place 2 drops into both eyes every 2 (two) hours as needed (for allergies).   Yes Historical Provider, MD  colchicine 0.6 MG tablet Take 0.6mg  (one tablet) by mouth every 1-2 hours until one of the following occurs: 1.  The pain is gone 2.  The maximum dose has been given ( no more than 3 tabs in 3 hours or 10 tabs in 24 hours) 3.  The side effects outweight the benefits 08/26/14   Noemi Chapel, MD  metoprolol (LOPRESSOR) 25 MG tablet Take 1 tablet (25 mg total) by mouth 2 (two) times daily. 08/26/14   Noemi Chapel, MD  oxyCODONE-acetaminophen (PERCOCET) 5-325 MG per tablet Take 1 tablet by mouth every 4 (four) hours as needed. 08/26/14   Noemi Chapel, MD   BP 163/109 mmHg  Pulse 110  Temp(Src) 98.8 F (37.1 C) (Oral)  Resp 20  Ht 6\' 2"  (1.88 m)  Wt 270 lb (122.471 kg)  BMI 34.65 kg/m2  SpO2 100% Physical Exam  Constitutional: He appears well-developed and well-nourished. No distress.  HENT:  Head: Normocephalic and atraumatic.  Mouth/Throat: Oropharynx is clear and moist. No oropharyngeal exudate.  Eyes: Conjunctivae and EOM are normal. Pupils are equal, round, and reactive to light. Right eye exhibits no discharge. Left eye exhibits no discharge. No scleral icterus.  Cardiovascular: Normal  rate, regular rhythm, normal heart sounds and intact distal pulses.  Exam reveals no gallop and no friction rub.   No murmur heard. Pulmonary/Chest: Effort normal and breath sounds normal. No respiratory distress. He has no wheezes. He has no rales.  Musculoskeletal: Normal range of motion. He exhibits tenderness ( Moderate tenderness and mild swelling to the left ankle, midfoot. No pitting edema ). He exhibits no edema.  Neurological: He is alert. Coordination normal.  Skin: Skin is warm and dry.  Small abrasion to the dorsum of the left foot  Psychiatric: He has a  normal mood and affect. His behavior is normal.  Nursing note and vitals reviewed.   ED Course  Procedures (including critical care time) Labs Review Labs Reviewed  CBC - Abnormal; Notable for the following:    WBC 13.5 (*)    All other components within normal limits  I-STAT CHEM 8, ED - Abnormal; Notable for the following:    BUN 23 (*)    Creatinine, Ser 2.10 (*)    Glucose, Bld 109 (*)    All other components within normal limits  D-DIMER, QUANTITATIVE (NOT AT [facility])    Imaging Review No results found.    MDM   Final diagnoses:  Acute gouty arthritis  Essential hypertension  Renal failure    The patient has what appears to be possible acute gouty arthritis, he is hypertensive which I would expect as he has been unmedicated for many months to several years, he will need medications for both blood pressure and for gout, the family is very concerned for DVT, check d-dimer, at this hour ultrasound is not available, he would need to come back in the morning if the d-dimer was elevated. He is otherwise low risk for that and a d-dimer would be sufficient if negative. The patient is in agreement with the plan.  The patient was counseled on his abnormal lab findings including a creatinine which continues to rise. I suspect this is related to untreated hypertension which has been present for several years, he has been informed of the need for treatment of the blood pressure as well as his pain, he will be given the medications as below, he is willing to fill them, pulling to follow-up in the outpatient setting and states that he has recently received health insurance and has no problem following up. He has expressed his understanding to the abnormal findings and indications for return. He appears stable for discharge.  Labs show negative d-dimer  Meds given in ED:  Medications  HYDROmorphone (DILAUDID) injection 1 mg (not administered)  oxyCODONE-acetaminophen (PERCOCET/ROXICET)  5-325 MG per tablet 2 tablet (2 tablets Oral Given 08/26/14 2100)  colchicine tablet 1.2 mg (1.2 mg Oral Given 08/26/14 2101)  HYDROmorphone (DILAUDID) injection 1 mg (1 mg Intramuscular Given 08/26/14 2146)    New Prescriptions   COLCHICINE 0.6 MG TABLET    Take 0.6mg  (one tablet) by mouth every 1-2 hours until one of the following occurs: 1.  The pain is gone 2.  The maximum dose has been given ( no more than 3 tabs in 3 hours or 10 tabs in 24 hours) 3.  The side effects outweight the benefits   METOPROLOL (LOPRESSOR) 25 MG TABLET    Take 1 tablet (25 mg total) by mouth 2 (two) times daily.   OXYCODONE-ACETAMINOPHEN (PERCOCET) 5-325 MG PER TABLET    Take 1 tablet by mouth every 4 (four) hours as needed.        Aaron Edelman  Sabra Heck, MD 08/26/14 2219

## 2014-08-26 NOTE — Discharge Instructions (Signed)
Please call your doctor for a followup appointment within 24-48 hours. When you talk to your doctor please let them know that you were seen in the emergency department and have them acquire all of your records so that they can discuss the findings with you and formulate a treatment plan to fully care for your new and ongoing problems. ° °

## 2014-08-26 NOTE — ED Notes (Signed)
Patient sent here for further evaluation per Dr. Ronnald Ramp

## 2015-06-29 ENCOUNTER — Encounter (HOSPITAL_COMMUNITY): Payer: Self-pay

## 2015-06-29 ENCOUNTER — Emergency Department (HOSPITAL_COMMUNITY)
Admission: EM | Admit: 2015-06-29 | Discharge: 2015-06-29 | Disposition: A | Discharge status: Home / Self Care | Payer: No Typology Code available for payment source | Attending: Emergency Medicine | Admitting: Emergency Medicine

## 2015-06-29 DIAGNOSIS — M109 Gout, unspecified: Secondary | ICD-10-CM

## 2015-06-29 DIAGNOSIS — M10071 Idiopathic gout, right ankle and foot: Secondary | ICD-10-CM | POA: Insufficient documentation

## 2015-06-29 DIAGNOSIS — Z79899 Other long term (current) drug therapy: Secondary | ICD-10-CM | POA: Insufficient documentation

## 2015-06-29 DIAGNOSIS — I10 Essential (primary) hypertension: Secondary | ICD-10-CM | POA: Insufficient documentation

## 2015-06-29 DIAGNOSIS — Z87891 Personal history of nicotine dependence: Secondary | ICD-10-CM | POA: Insufficient documentation

## 2015-06-29 MED ORDER — PREDNISONE 20 MG PO TABS
ORAL_TABLET | ORAL | Status: DC
Start: 2015-06-29 — End: 2016-05-22

## 2015-06-29 MED ORDER — OXYCODONE-ACETAMINOPHEN 5-325 MG PO TABS: 1.0000 | ORAL_TABLET | ORAL | Status: DC | PRN

## 2015-06-29 MED ORDER — OXYCODONE-ACETAMINOPHEN 5-325 MG PO TABS
2.0000 | ORAL_TABLET | Freq: Once | ORAL | Status: AC
  Administered 2015-06-29: 2 via ORAL
  Filled 2015-06-29: qty 2

## 2015-06-29 MED ORDER — DEXAMETHASONE SODIUM PHOSPHATE 10 MG/ML IJ SOLN
10.0000 mg | Freq: Once | INTRAMUSCULAR | Status: AC
  Administered 2015-06-29: 10 mg via INTRAMUSCULAR
  Filled 2015-06-29: qty 1

## 2015-06-29 NOTE — ED Notes (Signed)
Pt complains of gout pain in his right foot for three days

## 2015-06-29 NOTE — Discharge Instructions (Signed)
Gout °Gout is when your joints become red, sore, and swell (inflamed). This is caused by the buildup of uric acid crystals in the joints. Uric acid is a chemical that is normally in the blood. If the level of uric acid gets too high in the blood, these crystals form in your joints and tissues. Over time, these crystals can form into masses near the joints and tissues. These masses can destroy bone and cause the bone to look misshapen (deformed). °HOME CARE  °· Do not take aspirin for pain. °· Only take medicine as told by your doctor. °· Rest the joint as much as you can. When in bed, keep sheets and blankets off painful areas. °· Keep the sore joints raised (elevated). °· Put warm or cold packs on painful joints. Use of warm or cold packs depends on which works best for you. °· Use crutches if the painful joint is in your leg. °· Drink enough fluids to keep your pee (urine) clear or pale yellow. Limit alcohol, sugary drinks, and drinks with fructose in them. °· Follow your diet instructions. Pay careful attention to how much protein you eat. Include fruits, vegetables, whole grains, and fat-free or low-fat milk products in your daily diet. Talk to your doctor or dietitian about the use of coffee, vitamin C, and cherries. These may help lower uric acid levels. °· Keep a healthy body weight. °GET HELP RIGHT AWAY IF:  °· You have watery poop (diarrhea), throw up (vomit), or have any side effects from medicines. °· You do not feel better in 24 hours, or you are getting worse. °· Your joint becomes suddenly more tender, and you have chills or a fever. °MAKE SURE YOU:  °· Understand these instructions. °· Will watch your condition. °· Will get help right away if you are not doing well or get worse. °  °This information is not intended to replace advice given to you by your health care provider. Make sure you discuss any questions you have with your health care provider. °  °Document Released: 12/13/2007 Document Revised:  03/26/2014 Document Reviewed: 10/17/2011 °Elsevier Interactive Patient Education ©2016 [employer] ° °

## 2015-06-29 NOTE — ED Provider Notes (Signed)
CSN: [id number]     Arrival date & time 06/29/15  0106 History  By signing my name below, I, Helane Gunther, attest that this documentation has been prepared under the direction and in the presence of Orpah Greek, MD. Electronically Signed: Helane Gunther, ED Scribe. 06/29/2015. 2:20 AM.    Chief Complaint  Patient presents with  . Gout   The history is provided by the patient. No language interpreter was used.   HPI Comments: Michael [name] is a 40 y.o. male former smoker with a PMHx of HTN who presents to the Emergency Department complaining of constant, worsening right-foot pain due to gout onset 4 days ago. Pt reports associated swelling to the entire right foot. He notes he was seen for this in the past, and was prescribed medication, of which he no longer has any.   Past Medical History  Diagnosis Date  . Hypertension    History reviewed. No pertinent past surgical history. History reviewed. No pertinent family history. Social History  Substance Use Topics  . Smoking status: Former Research scientist (life sciences)  . Smokeless tobacco: None  . Alcohol Use: Yes     Comment: occ    Review of Systems  Musculoskeletal: Positive for myalgias, joint swelling and arthralgias.  All other systems reviewed and are negative.   Allergies  Shellfish allergy  Home Medications   Prior to Admission medications   Medication Sig Start Date End Date Taking? Authorizing Provider  colchicine 0.6 MG tablet Take 0.6mg  (one tablet) by mouth every 1-2 hours until one of the following occurs: 1.  The pain is gone 2.  The maximum dose has been given ( no more than 3 tabs in 3 hours or 10 tabs in 24 hours) 3.  The side effects outweight the benefits 08/26/14   Noemi Chapel, MD  diphenhydrAMINE (BENADRYL) 25 mg capsule Take 50 mg by mouth every 4 (four) hours as needed for allergies.    Historical Provider, MD  ibuprofen (ADVIL,MOTRIN) 200 MG tablet Take 400 mg by mouth every 6 (six) hours as needed for moderate  pain.    Historical Provider, MD  metoprolol (LOPRESSOR) 25 MG tablet Take 1 tablet (25 mg total) by mouth 2 (two) times daily. 08/26/14   Noemi Chapel, MD  oxyCODONE-acetaminophen (PERCOCET) 5-325 MG tablet Take 1-2 tablets by mouth every 4 (four) hours as needed. 06/29/15   Orpah Greek, MD  predniSONE (DELTASONE) 20 MG tablet 3 tabs po daily x 3 days, then 2 tabs x 3 days, then 1.5 tabs x 3 days, then 1 tab x 3 days, then 0.5 tabs x 3 days 06/29/15   Orpah Greek, MD  tetrahydrozoline (VISINE) 0.05 % ophthalmic solution Place 2 drops into both eyes every 2 (two) hours as needed (for allergies).    Historical Provider, MD   BP 158/101 mmHg  Pulse 93  Temp(Src) 98.3 F (36.8 C) (Oral)  Resp 16  Ht 6\' 1"  (1.854 m)  Wt 262 lb (118.842 kg)  BMI 34.57 kg/m2  SpO2 98% Physical Exam  Constitutional: He is oriented to person, place, and time. He appears well-developed and well-nourished. No distress.  HENT:  Head: Normocephalic and atraumatic.  Right Ear: Hearing normal.  Left Ear: Hearing normal.  Nose: Nose normal.  Mouth/Throat: Oropharynx is clear and moist and mucous membranes are normal.  Eyes: Conjunctivae and EOM are normal. Pupils are equal, round, and reactive to light.  Neck: Normal range of motion. Neck supple.  Cardiovascular: Regular rhythm, S1 normal  and S2 normal.  Exam reveals no gallop and no friction rub.   No murmur heard. Pulmonary/Chest: Effort normal and breath sounds normal. No respiratory distress. He exhibits no tenderness.  Abdominal: Soft. Normal appearance and bowel sounds are normal. There is no hepatosplenomegaly. There is no tenderness. There is no rebound, no guarding, no tenderness at McBurney's point and negative Murphy's sign. No hernia.  Musculoskeletal: Normal range of motion. He exhibits edema and tenderness.  Diffuse TTP and swelling around the foot, centered around the first MTP joint  Neurological: He is alert and oriented to person,  place, and time. He has normal strength. No cranial nerve deficit or sensory deficit. Coordination normal. GCS eye subscore is 4. GCS verbal subscore is 5. GCS motor subscore is 6.  Skin: Skin is warm, dry and intact. No rash noted. No cyanosis.  Psychiatric: He has a normal mood and affect. His speech is normal and behavior is normal. Thought content normal.  Nursing note and vitals reviewed.   ED Course  Procedures  DIAGNOSTIC STUDIES: Oxygen Saturation is 98% on RA, normal by my interpretation.    COORDINATION OF CARE: 2:16 AM - Discussed plans to treat pt for pain and inflammation. Pt advised of plan for treatment and pt agrees.  Labs Review Labs Reviewed - No data to display  Imaging Review No results found. I have personally reviewed and evaluated these images and lab results as part of my medical decision-making.   EKG Interpretation None      MDM   Final diagnoses:  Acute gout of right foot, unspecified cause    Presents to the ER for evaluation of pain of the right foot. Patient reports symptoms began 3 or 4 days ago. He has had similar pain with gout in the past. Examination reveals swelling and diffuse tenderness around the MTP joint, no evidence of trauma. He has normal dorsalis pedal pulse. Sensation is normal. There is no evidence of infection.  I personally performed the services described in this documentation, which was scribed in my presence. The recorded information has been reviewed and is accurate.   Orpah Greek, MD 06/29/15 [phone]

## 2016-05-22 ENCOUNTER — Inpatient Hospital Stay (HOSPITAL_COMMUNITY)
Admission: EM | Admit: 2016-05-22 | Discharge: 2016-05-25 | DRG: 690 | Disposition: A | Discharge status: Home / Self Care | Payer: Self-pay | Attending: Internal Medicine | Admitting: Internal Medicine

## 2016-05-22 ENCOUNTER — Encounter (HOSPITAL_COMMUNITY): Payer: Self-pay

## 2016-05-22 ENCOUNTER — Emergency Department (HOSPITAL_COMMUNITY): Payer: Self-pay

## 2016-05-22 ENCOUNTER — Ambulatory Visit (HOSPITAL_COMMUNITY)
Admission: EM | Admit: 2016-05-22 | Discharge: 2016-05-22 | Disposition: A | Discharge status: Nursing Home (Includes ICF) | Payer: No Typology Code available for payment source | Attending: Family Medicine | Admitting: Family Medicine

## 2016-05-22 ENCOUNTER — Encounter (HOSPITAL_COMMUNITY): Payer: Self-pay | Admitting: Emergency Medicine

## 2016-05-22 DIAGNOSIS — N183 Chronic kidney disease, stage 3 unspecified: Secondary | ICD-10-CM | POA: Diagnosis present

## 2016-05-22 DIAGNOSIS — G4733 Obstructive sleep apnea (adult) (pediatric): Secondary | ICD-10-CM | POA: Diagnosis present

## 2016-05-22 DIAGNOSIS — M109 Gout, unspecified: Secondary | ICD-10-CM | POA: Diagnosis present

## 2016-05-22 DIAGNOSIS — N10 Acute tubulo-interstitial nephritis: Principal | ICD-10-CM | POA: Diagnosis present

## 2016-05-22 DIAGNOSIS — Z87891 Personal history of nicotine dependence: Secondary | ICD-10-CM

## 2016-05-22 DIAGNOSIS — Q612 Polycystic kidney, adult type: Secondary | ICD-10-CM

## 2016-05-22 DIAGNOSIS — R6510 Systemic inflammatory response syndrome (SIRS) of non-infectious origin without acute organ dysfunction: Secondary | ICD-10-CM | POA: Diagnosis present

## 2016-05-22 DIAGNOSIS — I129 Hypertensive chronic kidney disease with stage 1 through stage 4 chronic kidney disease, or unspecified chronic kidney disease: Secondary | ICD-10-CM | POA: Diagnosis present

## 2016-05-22 DIAGNOSIS — E6609 Other obesity due to excess calories: Secondary | ICD-10-CM | POA: Diagnosis present

## 2016-05-22 DIAGNOSIS — I169 Hypertensive crisis, unspecified: Secondary | ICD-10-CM | POA: Diagnosis present

## 2016-05-22 DIAGNOSIS — N12 Tubulo-interstitial nephritis, not specified as acute or chronic: Secondary | ICD-10-CM

## 2016-05-22 DIAGNOSIS — Z7289 Other problems related to lifestyle: Secondary | ICD-10-CM

## 2016-05-22 DIAGNOSIS — Z6833 Body mass index (BMI) 33.0-33.9, adult: Secondary | ICD-10-CM

## 2016-05-22 DIAGNOSIS — Z91013 Allergy to seafood: Secondary | ICD-10-CM

## 2016-05-22 DIAGNOSIS — I10 Essential (primary) hypertension: Secondary | ICD-10-CM | POA: Diagnosis present

## 2016-05-22 DIAGNOSIS — R319 Hematuria, unspecified: Secondary | ICD-10-CM | POA: Diagnosis present

## 2016-05-22 DIAGNOSIS — R000 Tachycardia, unspecified: Secondary | ICD-10-CM | POA: Diagnosis present

## 2016-05-22 DIAGNOSIS — R1900 Intra-abdominal and pelvic swelling, mass and lump, unspecified site: Secondary | ICD-10-CM

## 2016-05-22 DIAGNOSIS — R7989 Other specified abnormal findings of blood chemistry: Secondary | ICD-10-CM

## 2016-05-22 DIAGNOSIS — R310 Gross hematuria: Secondary | ICD-10-CM

## 2016-05-22 DIAGNOSIS — R3915 Urgency of urination: Secondary | ICD-10-CM | POA: Diagnosis present

## 2016-05-22 DIAGNOSIS — Z6834 Body mass index (BMI) 34.0-34.9, adult: Secondary | ICD-10-CM | POA: Diagnosis present

## 2016-05-22 DIAGNOSIS — R6889 Other general symptoms and signs: Secondary | ICD-10-CM

## 2016-05-22 HISTORY — DX: Polycystic kidney, unspecified: Q61.3

## 2016-05-22 LAB — COMPREHENSIVE METABOLIC PANEL
ALT: 20 U/L (ref 17–63)
AST: 20 U/L (ref 15–41)
Albumin: 3.9 g/dL (ref 3.5–5.0)
Alkaline Phosphatase: 53 U/L (ref 38–126)
Anion gap: 11 (ref 5–15)
BUN: 27 mg/dL — ABNORMAL HIGH (ref 6–20)
CO2: 22 mmol/L (ref 22–32)
Calcium: 9 mg/dL (ref 8.9–10.3)
Chloride: 102 mmol/L (ref 101–111)
Creatinine, Ser: 2.31 mg/dL — ABNORMAL HIGH (ref 0.61–1.24)
GFR calc Af Amer: 39 mL/min — ABNORMAL LOW (ref >60)
GFR calc non Af Amer: 34 mL/min — ABNORMAL LOW (ref >60)
Glucose, Bld: 99 mg/dL (ref 65–99)
Potassium: 4.1 mmol/L (ref 3.5–5.1)
Sodium: 135 mmol/L (ref 135–145)
Total Bilirubin: 1.3 mg/dL — ABNORMAL HIGH (ref 0.3–1.2)
Total Protein: 8.1 g/dL (ref 6.5–8.1)

## 2016-05-22 LAB — URINALYSIS, ROUTINE W REFLEX MICROSCOPIC
Bilirubin Urine: NEGATIVE
Glucose, UA: NEGATIVE mg/dL
Ketones, ur: NEGATIVE mg/dL
Leukocytes, UA: NEGATIVE
Nitrite: NEGATIVE
Protein, ur: 100 mg/dL — AB
Specific Gravity, Urine: 1.015 (ref 1.005–1.030)
pH: 6 (ref 5.0–8.0)

## 2016-05-22 LAB — I-STAT CG4 LACTIC ACID, ED: Lactic Acid, Venous: 0.54 mmol/L (ref 0.5–1.9)

## 2016-05-22 LAB — POCT I-STAT, CHEM 8
BUN: 28 mg/dL — ABNORMAL HIGH (ref 6–20)
Calcium, Ion: 1.07 mmol/L — ABNORMAL LOW (ref 1.15–1.40)
Chloride: 103 mmol/L (ref 101–111)
Creatinine, Ser: 2.2 mg/dL — ABNORMAL HIGH (ref 0.61–1.24)
Glucose, Bld: 100 mg/dL — ABNORMAL HIGH (ref 65–99)
HCT: 41 % (ref 39.0–52.0)
Hemoglobin: 13.9 g/dL (ref 13.0–17.0)
Potassium: 4.4 mmol/L (ref 3.5–5.1)
Sodium: 140 mmol/L (ref 135–145)
TCO2: 22 mmol/L (ref 0–100)

## 2016-05-22 LAB — CBC
HCT: 39.1 % (ref 39.0–52.0)
Hemoglobin: 12.8 g/dL — ABNORMAL LOW (ref 13.0–17.0)
MCH: 26.6 pg (ref 26.0–34.0)
MCHC: 32.7 g/dL (ref 30.0–36.0)
MCV: 81.1 fL (ref 78.0–100.0)
Platelets: 295 10*3/uL (ref 150–400)
RBC: 4.82 MIL/uL (ref 4.22–5.81)
RDW: 14.3 % (ref 11.5–15.5)
WBC: 14.7 10*3/uL — ABNORMAL HIGH (ref 4.0–10.5)

## 2016-05-22 LAB — I-STAT TROPONIN, ED: Troponin i, poc: 0.04 ng/mL (ref 0.00–0.08)

## 2016-05-22 LAB — POCT URINALYSIS DIP (DEVICE)
Bilirubin Urine: NEGATIVE
Glucose, UA: NEGATIVE mg/dL
Ketones, ur: NEGATIVE mg/dL
Leukocytes, UA: NEGATIVE
Nitrite: NEGATIVE
Protein, ur: >=300 mg/dL — AB
Specific Gravity, Urine: 1.02 (ref 1.005–1.030)
Urobilinogen, UA: 0.2 mg/dL (ref 0.0–1.0)
pH: 6.5 (ref 5.0–8.0)

## 2016-05-22 LAB — LIPASE, BLOOD: Lipase: 24 U/L (ref 11–51)

## 2016-05-22 MED ORDER — ONDANSETRON HCL 4 MG/2ML IJ SOLN
4.0000 mg | Freq: Once | INTRAMUSCULAR | Status: AC
  Administered 2016-05-22: 4 mg via INTRAVENOUS
  Filled 2016-05-22: qty 2

## 2016-05-22 MED ORDER — HYDROMORPHONE HCL 2 MG/ML IJ SOLN
0.5000 mg | Freq: Once | INTRAMUSCULAR | Status: AC
  Administered 2016-05-22: 0.5 mg via INTRAVENOUS
  Filled 2016-05-22: qty 1

## 2016-05-22 MED ORDER — ACETAMINOPHEN 325 MG PO TABS
ORAL_TABLET | ORAL | Status: AC
  Filled 2016-05-22: qty 2

## 2016-05-22 MED ORDER — ACETAMINOPHEN 325 MG PO TABS
650.0000 mg | ORAL_TABLET | Freq: Once | ORAL | Status: AC
  Administered 2016-05-22: 650 mg via ORAL

## 2016-05-22 NOTE — ED Triage Notes (Signed)
Pt sent from u/c for gross hematuria that was noticed while at u/c, onset 2 days left lateral abd pain and swelling, sweating, lightheaded, fever.  Onset 5 weeks polyruia, polyphagia, polydipsia.  No vomiting, diarrhea.  Last BM yesterday.

## 2016-05-22 NOTE — ED Provider Notes (Signed)
[facility]    CSN: [id number] Arrival date & time: 05/22/16  [place]     History   Chief Complaint Chief Complaint  Patient presents with  . Fever  . Generalized Body Aches    HPI Michael [name] is a 41 y.o. male.   The patient presented to the [facility] with a complaint of a fever and general body aches x 2 days.  He had traveled to [place] to lay flowers on his mother's grave (one year anniversary of her death), when he developed diaphoresis, blurred vision, and weakness. He noticed that he had some swelling on his left abdominal wall which has been painful over the last 2 days.  Patient admits to 5 weeks of polyuria, polydipsia, and nocturia. He had a bad cold about 12 days ago but the symptoms of sore throat and congestion have cleared.  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      Past Medical History:  Diagnosis Date  . Hypertension   . Polycystic kidney disease     There are no active problems to display for this patient.   History reviewed. No pertinent surgical history.     Home Medications    Prior to Admission medications   Medication Sig Start Date End Date Taking? Authorizing Provider  ibuprofen (ADVIL,MOTRIN) 200 MG tablet Take 400 mg by mouth every 6 (six) hours as needed for moderate pain.   Yes Historical Provider, MD    Family History History reviewed. No pertinent family history.  Social History Social History  Substance Use Topics  . Smoking status: Former Research scientist (life sciences)  . Smokeless tobacco: Not on file  . Alcohol use Yes     Comment: occ     Allergies   Shellfish allergy   Review of Systems Review of Systems  Constitutional: Positive for chills, diaphoresis, fatigue and fever.  HENT: Negative.   Respiratory: Negative.   Cardiovascular: Negative for chest pain.  Gastrointestinal: Positive for abdominal pain. Negative for diarrhea, nausea and vomiting.  Endocrine: Positive for polydipsia, polyphagia and polyuria.    Genitourinary: Positive for urgency.  Musculoskeletal: Positive for myalgias.  Neurological: Positive for light-headedness.  Psychiatric/Behavioral: Negative for agitation.     Physical Exam Triage Vital Signs ED Triage Vitals [05/22/16 1847]  Enc Vitals Group     BP 154/83     Pulse Rate 119     Resp 16     Temp 101.9 F (38.8 C)     Temp Source Oral     SpO2 98 %     Weight      Height      Head Circumference      Peak Flow      Pain Score      Pain Loc      Pain Edu?      Excl. in [place]?    No data found.   Updated Vital Signs BP 154/83 (BP Location: Left Arm)   Pulse 119   Temp 101.9 F (38.8 C) (Oral)   Resp 16   SpO2 98%    Physical Exam  Constitutional: He is oriented to person, place, and time. He appears well-developed and well-nourished.  HENT:  Right Ear: External ear normal.  Left Ear: External ear normal.  Patient has gingivitis around tooth #17  Eyes: Conjunctivae and EOM are normal. Pupils are equal, round, and reactive to light.  Neck: Normal range of motion. Neck supple.  Cardiovascular: Regular rhythm and normal heart sounds.   Pulmonary/Chest: Effort  normal and breath sounds normal.  Abdominal: Soft.  Patient has moderate swelling that measures about 8 cm on the lateral left abdominal wall in this area is tender to palpate with some deep induration.  Musculoskeletal: Normal range of motion.  Lymphadenopathy:    He has no cervical adenopathy.  Neurological: He is alert and oriented to person, place, and time.  Skin: Skin is warm and dry. No rash noted. No erythema.  Nursing note and vitals reviewed.    UC Treatments / Results  Labs (all labs ordered are listed, but only abnormal results are displayed)  Results for orders placed or performed during the hospital encounter of 05/22/16  POCT urinalysis dip (device)  Result Value Ref Range   Glucose, UA NEGATIVE NEGATIVE mg/dL   Bilirubin Urine NEGATIVE NEGATIVE   Ketones, ur NEGATIVE  NEGATIVE mg/dL   Specific Gravity, Urine 1.020 1.005 - 1.030   Hgb urine dipstick LARGE (A) NEGATIVE   pH 6.5 5.0 - 8.0   Protein, ur >=300 (A) NEGATIVE mg/dL   Urobilinogen, UA 0.2 0.0 - 1.0 mg/dL   Nitrite NEGATIVE NEGATIVE   Leukocytes, UA NEGATIVE NEGATIVE  I-STAT, chem 8  Result Value Ref Range   Sodium 140 135 - 145 mmol/L   Potassium 4.4 3.5 - 5.1 mmol/L   Chloride 103 101 - 111 mmol/L   BUN 28 (H) 6 - 20 mg/dL   Creatinine, Ser 2.20 (H) 0.61 - 1.24 mg/dL   Glucose, Bld 100 (H) 65 - 99 mg/dL   Calcium, Ion 1.07 (L) 1.15 - 1.40 mmol/L   TCO2 22 0 - 100 mmol/L   Hemoglobin 13.9 13.0 - 17.0 g/dL   HCT 41.0 39.0 - 52.0 %    EKG  EKG Interpretation None       Radiology No results found.  Procedures Procedures (including critical care time)  Medications Ordered in UC Medications  acetaminophen (TYLENOL) tablet 650 mg (650 mg Oral Given 05/22/16 1858)     Initial Impression / Assessment and Plan / UC Course  I have reviewed the triage vital signs and the nursing notes.  Pertinent labs & imaging results that were available during my care of the patient were reviewed by me and considered in my medical decision making (see chart for details).     Final Clinical Impressions(s) / UC Diagnoses   Final diagnoses:  Gross hematuria  Flu-like symptoms  Azotemia  Abdominal wall swelling    New Prescriptions New Prescriptions   No medications on file     Robyn Haber, MD 05/22/16 1945

## 2016-05-22 NOTE — ED Provider Notes (Signed)
[facility] DEPT Provider Note   CSN: [id number] Arrival date & time: 05/22/16  2002     History   Chief Complaint Chief Complaint  Patient presents with  . Abdominal Pain    HPI Michael [name] is a 41 y.o. male.  HPI Michael [name] is a 41 y.o. male with hx of PKD, HTN, presents to ED with complaint of fever and left abdominal pain. States fever for about 3 days, up to 101. Abdominal pain in the left side, worsening. States "I feel like got punched in my abdomen." Reports urinary frequency, hematuria, urinary urgency. Denies nausea or vomiting. Normal bowel movements. Currently does not have a PCP bc no insurance. Went to UC and was sent here for further evaluation.   Past Medical History:  Diagnosis Date  . Hypertension   . Polycystic kidney disease     There are no active problems to display for this patient.   History reviewed. No pertinent surgical history.     Home Medications    Prior to Admission medications   Medication Sig Start Date End Date Taking? Authorizing Provider  ibuprofen (ADVIL,MOTRIN) 200 MG tablet Take 400 mg by mouth every 6 (six) hours as needed for moderate pain.    Historical Provider, MD    Family History History reviewed. No pertinent family history.  Social History Social History  Substance Use Topics  . Smoking status: Former Research scientist (life sciences)  . Smokeless tobacco: Never Used  . Alcohol use Yes     Comment: wine occ      Allergies   Shellfish allergy   Review of Systems Review of Systems  Constitutional: Positive for chills and fever.  Respiratory: Negative for cough, chest tightness and shortness of breath.   Cardiovascular: Negative for chest pain, palpitations and leg swelling.  Gastrointestinal: Positive for abdominal pain and nausea. Negative for abdominal distention, diarrhea and vomiting.  Genitourinary: Positive for dysuria, flank pain, frequency and urgency. Negative for difficulty urinating and hematuria.  Musculoskeletal:  Positive for arthralgias and myalgias. Negative for neck pain and neck stiffness.  Skin: Negative for rash.  Allergic/Immunologic: Negative for immunocompromised state.  Neurological: Negative for dizziness, weakness, light-headedness, numbness and headaches.  All other systems reviewed and are negative.    Physical Exam Updated Vital Signs BP 116/80   Pulse 92   Temp 100 F (37.8 C) (Oral)   Resp 19   Ht 6\' 2"  (1.88 m)   Wt 73.9 kg   SpO2 96%   BMI 20.93 kg/m   Physical Exam  Constitutional: He is oriented to person, place, and time. He appears well-developed and well-nourished. No distress.  HENT:  Head: Normocephalic and atraumatic.  Eyes: Conjunctivae are normal.  Neck: Neck supple.  Cardiovascular: Normal rate, regular rhythm and normal heart sounds.   Pulmonary/Chest: Effort normal. No respiratory distress. He has no wheezes. He has no rales.  Abdominal: Soft. Bowel sounds are normal. He exhibits no distension. There is tenderness. There is no rebound.  Diffuse tenderness worse in left upper and lower quadrants. Left CVA tenderness  Musculoskeletal: He exhibits no edema.  Neurological: He is alert and oriented to person, place, and time.  Skin: Skin is warm and dry.  Nursing note and vitals reviewed.    ED Treatments / Results  Labs (all labs ordered are listed, but only abnormal results are displayed) Labs Reviewed  COMPREHENSIVE METABOLIC PANEL - Abnormal; Notable for the following:       Result Value   BUN 27 (*)  Creatinine, Ser 2.31 (*)    Total Bilirubin 1.3 (*)    GFR calc non Af Amer 34 (*)    GFR calc Af Amer 39 (*)    All other components within normal limits  CBC - Abnormal; Notable for the following:    WBC 14.7 (*)    Hemoglobin 12.8 (*)    All other components within normal limits  URINALYSIS, ROUTINE W REFLEX MICROSCOPIC - Abnormal; Notable for the following:    Hgb urine dipstick LARGE (*)    Protein, ur 100 (*)    Bacteria, UA RARE (*)     Squamous Epithelial / LPF 0-5 (*)    All other components within normal limits  URINE CULTURE  LIPASE, BLOOD  I-STAT TROPOININ, ED  I-STAT CG4 LACTIC ACID, ED  I-STAT CG4 LACTIC ACID, ED    EKG  EKG Interpretation None       Radiology Ct Abdomen Pelvis Wo Contrast  Result Date: 05/23/2016 CLINICAL DATA:  Initial evaluation for acute fever with left-sided abdominal pain. History polycystic kidney disease. EXAM: CT ABDOMEN AND PELVIS WITHOUT CONTRAST TECHNIQUE: Multidetector CT imaging of the abdomen and pelvis was performed following the standard protocol without IV contrast. COMPARISON:  Prior CT from 12/02/2009. FINDINGS: Lower chest: Visualized lung bases are clear. Hepatobiliary: Liver demonstrates a normal unenhanced appearance. Gallbladder within normal limits. No biliary dilatation. Pancreas: Pancreas within normal limits. Spleen: Spleen within normal limits. Adrenals/Urinary Tract: Adrenal glands are normal. Kidneys are markedly enlarged with innumerable cysts present, compatible with history polycystic kidney disease. Several hyperdense lesions scattered throughout the kidneys, likely proteinaceous and/ or hemorrhagic cysts. There is mild asymmetric hazy stranding about the posterior/ inferior aspect of the left kidney, suspected to most likely be related to possible recent hemorrhage of a cyst. Superimposed infection not excluded. No nephrolithiasis. No hydronephrosis. No hydroureter. Bladder within normal limits. Stomach/Bowel: Stomach within normal limits. No evidence for bowel obstruction. Appendix well visualized within the lower mid abdomen and is within normal limits without associated inflammatory changes to suggest acute appendicitis. No acute inflammatory changes seen about the bowels. Vascular/Lymphatic: Intra-abdominal aorta of normal caliber. No pathologically enlarged intra-abdominal or pelvic lymph nodes. Reproductive: Prostate normal. Other: No free intraperitoneal air.   No free fluid. Musculoskeletal: No acute osseous abnormality. No worrisome lytic or blastic osseous lesions. IMPRESSION: 1. Enlarged kidneys with innumerable cysts present bilaterally, compatible with history of polycystic kidney disease. Mild asymmetric hazy stranding about the posterior/inferior aspect of the left kidney, felt to most likely be related to possible recent hemorrhage within the cyst. Superimposed infection not entirely excluded. Correlation with urinalysis recommended. 2. No other acute intra-abdominal or pelvic process. Electronically Signed   By: Jeannine Boga M.D.   On: 05/23/2016 00:38    Procedures Procedures (including critical care time)  Medications Ordered in ED Medications  HYDROmorphone (DILAUDID) injection 0.5 mg (not administered)  ondansetron (ZOFRAN) injection 4 mg (not administered)     Initial Impression / Assessment and Plan / ED Course  I have reviewed the triage vital signs and the nursing notes.  Pertinent labs & imaging results that were available during my care of the patient were reviewed by me and considered in my medical decision making (see chart for details).    Patient with history of polycystic kidney disease, here with left sided abdominal pain, fever, chills, generalize malaise. Patient's urinalysis shows too numerous to count red blood cells, rare bacteria, 6-30 WBCs. Urine culture sent. His heart rate and temperature improved  after Tylenol which was given to him at urgent care. I will add lactic acid. CBC is from triage showed elevated white blood cell count of 14.7. His creatinine is 2.31 which is close to his values from last year. I will get CT abdomen and pelvis for further evaluation of patient's symptoms.    CT scan as described above. Will cover for possible infection. 2 g of Rocephin ordered IV. Vital signs remain normal. I do not suspect sepsis with normal lactic acid normal vital signs. We'll continue to monitor. Patient is  requesting food.   Vitals:   05/22/16 2023 05/22/16 2155 05/23/16 0043 05/23/16 0044  BP: (!) 157/101 116/80 145/86   Pulse: 113 92  92  Resp: 16 19  23   Temp: 100 F (37.8 C)     TempSrc: Oral     SpO2: 96% 96%  (!) 89%  Weight: 73.9 kg     Height: 6\' 2"  (1.88 m)       Discussed with triad, will admit.    Final Clinical Impressions(s) / ED Diagnoses   Final diagnoses:  Pyelonephritis    New Prescriptions New Prescriptions   No medications on file     Jeannett Senior, PA-C 05/24/16 [place], MD 05/28/16 [phone]

## 2016-05-22 NOTE — ED Triage Notes (Signed)
The patient presented to the [facility] with a complaint of a fever and general body aches x 4 days.

## 2016-05-22 NOTE — ED Notes (Signed)
Patient transported to CT 

## 2016-05-22 NOTE — Discharge Instructions (Signed)
You have a large amount of blood in your urine and coupled with the swelling on your left side which is tender and the fever, I need to send you down to the emergency department for further evaluation tonight.

## 2016-05-23 ENCOUNTER — Encounter (HOSPITAL_COMMUNITY): Payer: Self-pay | Admitting: Internal Medicine

## 2016-05-23 DIAGNOSIS — N12 Tubulo-interstitial nephritis, not specified as acute or chronic: Secondary | ICD-10-CM

## 2016-05-23 DIAGNOSIS — R6510 Systemic inflammatory response syndrome (SIRS) of non-infectious origin without acute organ dysfunction: Secondary | ICD-10-CM | POA: Diagnosis present

## 2016-05-23 DIAGNOSIS — I10 Essential (primary) hypertension: Secondary | ICD-10-CM

## 2016-05-23 DIAGNOSIS — I169 Hypertensive crisis, unspecified: Secondary | ICD-10-CM | POA: Diagnosis present

## 2016-05-23 DIAGNOSIS — Q612 Polycystic kidney, adult type: Secondary | ICD-10-CM

## 2016-05-23 DIAGNOSIS — N183 Chronic kidney disease, stage 3 unspecified: Secondary | ICD-10-CM | POA: Diagnosis present

## 2016-05-23 LAB — CBC
HCT: 39.4 % (ref 39.0–52.0)
Hemoglobin: 12.7 g/dL — ABNORMAL LOW (ref 13.0–17.0)
MCH: 26.6 pg (ref 26.0–34.0)
MCHC: 32.2 g/dL (ref 30.0–36.0)
MCV: 82.4 fL (ref 78.0–100.0)
Platelets: 276 10*3/uL (ref 150–400)
RBC: 4.78 MIL/uL (ref 4.22–5.81)
RDW: 14.8 % (ref 11.5–15.5)
WBC: 13.5 10*3/uL — ABNORMAL HIGH (ref 4.0–10.5)

## 2016-05-23 LAB — BASIC METABOLIC PANEL
Anion gap: 10 (ref 5–15)
BUN: 27 mg/dL — ABNORMAL HIGH (ref 6–20)
CO2: 23 mmol/L (ref 22–32)
Calcium: 8.7 mg/dL — ABNORMAL LOW (ref 8.9–10.3)
Chloride: 103 mmol/L (ref 101–111)
Creatinine, Ser: 2.26 mg/dL — ABNORMAL HIGH (ref 0.61–1.24)
GFR calc Af Amer: 40 mL/min — ABNORMAL LOW (ref >60)
GFR calc non Af Amer: 35 mL/min — ABNORMAL LOW (ref >60)
Glucose, Bld: 108 mg/dL — ABNORMAL HIGH (ref 65–99)
Potassium: 4.2 mmol/L (ref 3.5–5.1)
Sodium: 136 mmol/L (ref 135–145)

## 2016-05-23 LAB — HIV ANTIBODY (ROUTINE TESTING W REFLEX): HIV Screen 4th Generation wRfx: NONREACTIVE

## 2016-05-23 LAB — RAPID URINE DRUG SCREEN, HOSP PERFORMED
Amphetamines: NOT DETECTED
Barbiturates: NOT DETECTED
Benzodiazepines: NOT DETECTED
Cocaine: NOT DETECTED
Opiates: POSITIVE — AB
Tetrahydrocannabinol: NOT DETECTED

## 2016-05-23 MED ORDER — HYDRALAZINE HCL 20 MG/ML IJ SOLN: 10.0000 mg | INTRAMUSCULAR | Status: DC | PRN

## 2016-05-23 MED ORDER — HYDROMORPHONE HCL 2 MG/ML IJ SOLN
1.0000 mg | Freq: Once | INTRAMUSCULAR | Status: AC
  Administered 2016-05-23: 1 mg via INTRAVENOUS
  Filled 2016-05-23: qty 1

## 2016-05-23 MED ORDER — ACETAMINOPHEN 650 MG RE SUPP: 650.0000 mg | Freq: Four times a day (QID) | RECTAL | Status: DC | PRN

## 2016-05-23 MED ORDER — PNEUMOCOCCAL VAC POLYVALENT 25 MCG/0.5ML IJ INJ
0.5000 mL | INJECTION | INTRAMUSCULAR | Status: DC
  Filled 2016-05-23: qty 0.5

## 2016-05-23 MED ORDER — DEXTROSE 5 % IV SOLN
1.0000 g | INTRAVENOUS | Status: DC
  Administered 2016-05-23 – 2016-05-24 (×2): 1 g via INTRAVENOUS
  Filled 2016-05-23 (×3): qty 10

## 2016-05-23 MED ORDER — ACETAMINOPHEN 325 MG PO TABS
650.0000 mg | ORAL_TABLET | Freq: Four times a day (QID) | ORAL | Status: DC | PRN
  Administered 2016-05-23 – 2016-05-25 (×5): 650 mg via ORAL
  Filled 2016-05-23 (×5): qty 2

## 2016-05-23 MED ORDER — ONDANSETRON HCL 4 MG/2ML IJ SOLN
4.0000 mg | Freq: Four times a day (QID) | INTRAMUSCULAR | Status: DC | PRN
  Administered 2016-05-25: 4 mg via INTRAVENOUS
  Filled 2016-05-23: qty 2

## 2016-05-23 MED ORDER — SODIUM CHLORIDE 0.9 % IV SOLN
INTRAVENOUS | Status: AC
  Administered 2016-05-23 (×2): via INTRAVENOUS

## 2016-05-23 MED ORDER — DEXTROSE 5 % IV SOLN
2.0000 g | Freq: Once | INTRAVENOUS | Status: AC
  Administered 2016-05-23: 2 g via INTRAVENOUS
  Filled 2016-05-23: qty 2

## 2016-05-23 MED ORDER — MORPHINE SULFATE (PF) 4 MG/ML IV SOLN
2.0000 mg | INTRAVENOUS | Status: DC | PRN
  Administered 2016-05-23 – 2016-05-25 (×9): 2 mg via INTRAVENOUS
  Filled 2016-05-23 (×9): qty 1

## 2016-05-23 MED ORDER — INFLUENZA VAC SPLIT QUAD 0.5 ML IM SUSY
0.5000 mL | PREFILLED_SYRINGE | INTRAMUSCULAR | Status: DC
Start: 2016-05-24 — End: 2016-05-25

## 2016-05-23 MED ORDER — ONDANSETRON HCL 4 MG PO TABS: 4.0000 mg | ORAL_TABLET | Freq: Four times a day (QID) | ORAL | Status: DC | PRN

## 2016-05-23 NOTE — Progress Notes (Signed)
PROGRESS NOTE                                                                                                                                                                                                             Patient Demographics:    Michael [name], is a 41 y.o. male, DOB - [birth year], TXM:[id number]  Admit date - 05/22/2016   Admitting Physician Rise Patience, MD  Outpatient Primary MD for the patient is No PCP Per Patient  LOS - 0  Outpatient Specialists: none  Chief Complaint  Patient presents with  . Abdominal Pain       Brief Narrative   41 year old male with history of polycystic kidney disease and hypertension who has not seen a doctor in the last 3 years or been on medications presented with SIRS secondary to UTI with left pyelonephritis. His presenting symptoms were left flank pain for the past 4-5 days followed by fevers with chills. In the ED he was tachycardic with fever of 101.78F and mild leukocytosis. CT scan of the abdomen showed left-sided haziness around the kidneys with possible hemorrhage into the cyst but infection could not be ruled out. Placed on observation.   Subjective:   Complains of some pain in his left mid quadrant. No nausea or vomiting. Denies dysuria.   Assessment  & Plan :    Principal Problem:   SIRS (systemic inflammatory response syndrome) (HCC)  Secondary to Acute Pyelonephritis. Continue empiric IV Rocephin. Follow urine culture. Continue IV hydration. Supportive care with Tylenol and pain medications.  Active Problems: Essential hypertension Not on any medications. Will start him on amlodipine.    Acute on CKD (chronic kidney disease) stage 3, GFR 30-59 ml/min No recent labs in the system. Last labs from June 2016 with creatinine of 2.1. Patient does not follow with any on as outpatient. Symptoms likely prerenal. Monitor with hydration. Avoid nephrotoxins.   Adult polycystic kidney disease Continue to monitor.   Patient needs to be established care as outpatient. Will consult care management to assist in establishing care at [facility].       Code Status : Full code  Family Communication  : None at bedside  Disposition Plan  : Home once improved, possibly in the next 24-48 hours  Barriers For Discharge : Active symptoms  Consults  :  None  Procedures  : CT abdomen and pelvis  DVT Prophylaxis  :  Lovenox -   Lab Results  Component Value Date   PLT 276 05/23/2016    Antibiotics  :    Anti-infectives    Start     Dose/Rate Route Frequency Ordered Stop   05/23/16 2200  cefTRIAXone (ROCEPHIN) 1 g in dextrose 5 % 50 mL IVPB     1 g 100 mL/hr over 30 Minutes Intravenous Every 24 hours 05/23/16 0142     05/23/16 0045  cefTRIAXone (ROCEPHIN) 2 g in dextrose 5 % 50 mL IVPB     2 g 100 mL/hr over 30 Minutes Intravenous  Once 05/23/16 0041 05/23/16 0154        Objective:   Vitals:   05/23/16 0130 05/23/16 0235 05/23/16 0630 05/23/16 0900  BP: 140/89 137/82 140/81 128/80  Pulse: 98 95 84 86  Resp: (!) 30 16 18 18   Temp:  98.3 F (36.8 C) 98.2 F (36.8 C) 98.6 F (37 C)  TempSrc:  Oral Oral Oral  SpO2: (!) 88% 93% 96% 96%  Weight:  119.3 kg (263 lb)    Height:  6\' 2"  (1.88 m)      Wt Readings from Last 3 Encounters:  05/23/16 119.3 kg (263 lb)  06/29/15 118.8 kg (262 lb)  08/26/14 122.5 kg (270 lb)     Intake/Output Summary (Last 24 hours) at 05/23/16 1156 Last data filed at 05/23/16 0636  Gross per 24 hour  Intake              835 ml  Output                0 ml  Net              835 ml     Physical Exam  Gen: not in distress HEENT:  moist mucosa, supple neck Chest: clear b/l, no added sounds CVS: N S1&S2, no murmurs,  GI: soft, Nondistended, mild mid abdomen and left CVA tenderness, bowel sounds present Musculoskeletal: warm, no edema     Data Review:    CBC  Recent Labs Lab  05/22/16 1926 05/22/16 2025 05/23/16 0707  WBC  --  14.7* 13.5*  HGB 13.9 12.8* 12.7*  HCT 41.0 39.1 39.4  PLT  --  295 276  MCV  --  81.1 82.4  MCH  --  26.6 26.6  MCHC  --  32.7 32.2  RDW  --  14.3 14.8    Chemistries   Recent Labs Lab 05/22/16 1926 05/22/16 2025 05/23/16 0707  NA 140 135 136  K 4.4 4.1 4.2  CL 103 102 103  CO2  --  22 23  GLUCOSE 100* 99 108*  BUN 28* 27* 27*  CREATININE 2.20* 2.31* 2.26*  CALCIUM  --  9.0 8.7*  AST  --  20  --   ALT  --  20  --   ALKPHOS  --  53  --   BILITOT  --  1.3*  --    ------------------------------------------------------------------------------------------------------------------ No results for input(s): CHOL, HDL, LDLCALC, TRIG, CHOLHDL, LDLDIRECT in the last 72 hours.  No results found for: HGBA1C ------------------------------------------------------------------------------------------------------------------ No results for input(s): TSH, T4TOTAL, T3FREE, THYROIDAB in the last 72 hours.  Invalid input(s): FREET3 ------------------------------------------------------------------------------------------------------------------ No results for input(s): VITAMINB12, FOLATE, FERRITIN, TIBC, IRON, RETICCTPCT in the last 72 hours.  Coagulation profile No results for input(s): INR, PROTIME in the last 168 hours.  No results for input(s):  DDIMER in the last 72 hours.  Cardiac Enzymes No results for input(s): CKMB, TROPONINI, MYOGLOBIN in the last 168 hours.  Invalid input(s): CK ------------------------------------------------------------------------------------------------------------------ No results found for: BNP  Inpatient Medications  Scheduled Meds: . cefTRIAXone (ROCEPHIN)  IV  1 g Intravenous Q24H  . [START ON 05/24/2016] Influenza vac split quadrivalent PF  0.5 mL Intramuscular Tomorrow-1000  . [START ON 05/24/2016] pneumococcal 23 valent vaccine  0.5 mL Intramuscular Tomorrow-1000   Continuous Infusions: .  sodium chloride 75 mL/hr at 05/23/16 0645   PRN Meds:.acetaminophen **OR** acetaminophen, hydrALAZINE, morphine injection, ondansetron **OR** ondansetron (ZOFRAN) IV  Micro Results No results found for this or any previous visit (from the past 240 hour(s)).  Radiology Reports Ct Abdomen Pelvis Wo Contrast  Result Date: 05/23/2016 CLINICAL DATA:  Initial evaluation for acute fever with left-sided abdominal pain. History polycystic kidney disease. EXAM: CT ABDOMEN AND PELVIS WITHOUT CONTRAST TECHNIQUE: Multidetector CT imaging of the abdomen and pelvis was performed following the standard protocol without IV contrast. COMPARISON:  Prior CT from 12/02/2009. FINDINGS: Lower chest: Visualized lung bases are clear. Hepatobiliary: Liver demonstrates a normal unenhanced appearance. Gallbladder within normal limits. No biliary dilatation. Pancreas: Pancreas within normal limits. Spleen: Spleen within normal limits. Adrenals/Urinary Tract: Adrenal glands are normal. Kidneys are markedly enlarged with innumerable cysts present, compatible with history polycystic kidney disease. Several hyperdense lesions scattered throughout the kidneys, likely proteinaceous and/ or hemorrhagic cysts. There is mild asymmetric hazy stranding about the posterior/ inferior aspect of the left kidney, suspected to most likely be related to possible recent hemorrhage of a cyst. Superimposed infection not excluded. No nephrolithiasis. No hydronephrosis. No hydroureter. Bladder within normal limits. Stomach/Bowel: Stomach within normal limits. No evidence for bowel obstruction. Appendix well visualized within the lower mid abdomen and is within normal limits without associated inflammatory changes to suggest acute appendicitis. No acute inflammatory changes seen about the bowels. Vascular/Lymphatic: Intra-abdominal aorta of normal caliber. No pathologically enlarged intra-abdominal or pelvic lymph nodes. Reproductive: Prostate normal. Other:  No free intraperitoneal air.  No free fluid. Musculoskeletal: No acute osseous abnormality. No worrisome lytic or blastic osseous lesions. IMPRESSION: 1. Enlarged kidneys with innumerable cysts present bilaterally, compatible with history of polycystic kidney disease. Mild asymmetric hazy stranding about the posterior/inferior aspect of the left kidney, felt to most likely be related to possible recent hemorrhage within the cyst. Superimposed infection not entirely excluded. Correlation with urinalysis recommended. 2. No other acute intra-abdominal or pelvic process. Electronically Signed   By: Jeannine Boga M.D.   On: 05/23/2016 00:38    Time Spent in minutes  25   Louellen Molder M.D on 05/23/2016 at 11:56 AM  Between 7am to 7pm - Pager - [phone]  After 7pm go to [email or web address] - password [facility]  [facility] -  Office  [phone]

## 2016-05-23 NOTE — Plan of Care (Signed)
Problem: Safety: Goal: Ability to remain free from injury will improve Outcome: Progressing Educated on using call light to ask for help.  Problem: Pain Managment: Goal: General experience of comfort will improve Outcome: Progressing Educated patient on pain medication and frequency.

## 2016-05-23 NOTE — ED Notes (Signed)
ED Provider at bedside. 

## 2016-05-23 NOTE — Progress Notes (Signed)
   05/23/16 1015  Clinical Encounter Type  Visited With Patient  Visit Type Other (Comment) ([facility] consult)  Spiritual Encounters  Spiritual Needs Emotional  Stress Factors  Patient Stress Factors None identified  Introduction to Pt. Pt reports he did not request an Advanced Directive.

## 2016-05-23 NOTE — ED Notes (Signed)
Admitting Provider at bedside. 

## 2016-05-23 NOTE — H&P (Signed)
History and Physical    Read Bonelli ALP:[id number] DOB: December 15, 1975 DOA: 05/22/2016  PCP: No PCP Per Patient  Patient coming from: Home.  Chief Complaint: Left flank pain fever chills.  HPI: Michael [name] is a 41 y.o. male with history of polycystic kidney disease, hypertension which has not been to a physician for last 3 years and has not been taking his medications started experiencing left flank pain over the last 4-5 days. Patient also has been having fever chills with sweats. Since pain worsened patient came to the ER. Denies any nausea vomiting or diarrhea. Patient started noticing some blood in the urine in the ER.   ED Course: On arrival patient was tachycardic with a fever of 101.32F and blood work shows leukocytosis. CT scan shows left-sided haziness around the kidneys probably secondary to hemorrhage into the cyst but infection not ruled out. He weighs compatible with UTI. Patient was started on ceftriaxone and cultures ordered.  Review of Systems: As per HPI, rest all negative.   Past Medical History:  Diagnosis Date  . Hypertension   . Polycystic kidney disease     History reviewed. No pertinent surgical history.   reports that he has quit smoking. He has never used smokeless tobacco. He reports that he drinks alcohol. He reports that he does not use drugs.  Allergies  Allergen Reactions  . Shellfish Allergy Anaphylaxis    Family History  Problem Relation Age of Onset  . Bipolar disorder Father     Prior to Admission medications   Medication Sig Start Date End Date Taking? Authorizing Provider  ibuprofen (ADVIL,MOTRIN) 200 MG tablet Take 400 mg by mouth every 6 (six) hours as needed for moderate pain.   Yes Historical Provider, MD    Physical Exam: Vitals:   05/22/16 2155 05/23/16 0043 05/23/16 0044 05/23/16 0100  BP: 116/80 145/86  147/91  Pulse: 92  92 100  Resp: 19  23 22   Temp:      TempSrc:      SpO2: 96%  98% 94%  Weight:      Height:           Constitutional: Moderately built and nourished. Vitals:   05/22/16 2155 05/23/16 0043 05/23/16 0044 05/23/16 0100  BP: 116/80 145/86  147/91  Pulse: 92  92 100  Resp: 19  23 22   Temp:      TempSrc:      SpO2: 96%  98% 94%  Weight:      Height:       Eyes: Anicteric no pallor. ENMT: No discharge from the ears eyes nose and mouth. Neck: No mass felt. No neck rigidity. Respiratory: No rhonchi or crepitations. Cardiovascular: S1-S2 heard no murmurs appreciated. Abdomen: Soft nontender positive present. Musculoskeletal: No edema. No joint effusion. Skin: No rash. Skin appears warm. Neurologic: Alert awake oriented to time place and person. Moves all extremities. Psychiatric: Appears normal. Normal affect.   Labs on Admission: I have personally reviewed following labs and imaging studies  CBC:  Recent Labs Lab 05/22/16 1926 05/22/16 2025  WBC  --  14.7*  HGB 13.9 12.8*  HCT 41.0 39.1  MCV  --  81.1  PLT  --  353   Basic Metabolic Panel:  Recent Labs Lab 05/22/16 1926 05/22/16 2025  NA 140 135  K 4.4 4.1  CL 103 102  CO2  --  22  GLUCOSE 100* 99  BUN 28* 27*  CREATININE 2.20* 2.31*  CALCIUM  --  9.0  GFR: Estimated Creatinine Clearance: 44.4 mL/min (by C-G formula based on SCr of 2.31 mg/dL (H)). Liver Function Tests:  Recent Labs Lab 05/22/16 2025  AST 20  ALT 20  ALKPHOS 53  BILITOT 1.3*  PROT 8.1  ALBUMIN 3.9    Recent Labs Lab 05/22/16 2025  LIPASE 24   No results for input(s): AMMONIA in the last 168 hours. Coagulation Profile: No results for input(s): INR, PROTIME in the last 168 hours. Cardiac Enzymes: No results for input(s): CKTOTAL, CKMB, CKMBINDEX, TROPONINI in the last 168 hours. BNP (last 3 results) No results for input(s): PROBNP in the last 8760 hours. HbA1C: No results for input(s): HGBA1C in the last 72 hours. CBG: No results for input(s): GLUCAP in the last 168 hours. Lipid Profile: No results for input(s):  CHOL, HDL, LDLCALC, TRIG, CHOLHDL, LDLDIRECT in the last 72 hours. Thyroid Function Tests: No results for input(s): TSH, T4TOTAL, FREET4, T3FREE, THYROIDAB in the last 72 hours. Anemia Panel: No results for input(s): VITAMINB12, FOLATE, FERRITIN, TIBC, IRON, RETICCTPCT in the last 72 hours. Urine analysis:    Component Value Date/Time   COLORURINE YELLOW 05/22/2016 2028   APPEARANCEUR CLEAR 05/22/2016 2028   LABSPEC 1.015 05/22/2016 2028   PHURINE 6.0 05/22/2016 2028   GLUCOSEU NEGATIVE 05/22/2016 2028   HGBUR LARGE (A) 05/22/2016 2028   BILIRUBINUR NEGATIVE 05/22/2016 2028   KETONESUR NEGATIVE 05/22/2016 2028   PROTEINUR 100 (A) 05/22/2016 2028   UROBILINOGEN 0.2 05/22/2016 1926   NITRITE NEGATIVE 05/22/2016 2028   LEUKOCYTESUR NEGATIVE 05/22/2016 2028   Sepsis Labs: @LABRCNTIP (procalcitonin:4,lacticidven:4) )No results found for this or any previous visit (from the past 240 hour(s)).   Radiological Exams on Admission: Ct Abdomen Pelvis Wo Contrast  Result Date: 05/23/2016 CLINICAL DATA:  Initial evaluation for acute fever with left-sided abdominal pain. History polycystic kidney disease. EXAM: CT ABDOMEN AND PELVIS WITHOUT CONTRAST TECHNIQUE: Multidetector CT imaging of the abdomen and pelvis was performed following the standard protocol without IV contrast. COMPARISON:  Prior CT from 12/02/2009. FINDINGS: Lower chest: Visualized lung bases are clear. Hepatobiliary: Liver demonstrates a normal unenhanced appearance. Gallbladder within normal limits. No biliary dilatation. Pancreas: Pancreas within normal limits. Spleen: Spleen within normal limits. Adrenals/Urinary Tract: Adrenal glands are normal. Kidneys are markedly enlarged with innumerable cysts present, compatible with history polycystic kidney disease. Several hyperdense lesions scattered throughout the kidneys, likely proteinaceous and/ or hemorrhagic cysts. There is mild asymmetric hazy stranding about the posterior/ inferior  aspect of the left kidney, suspected to most likely be related to possible recent hemorrhage of a cyst. Superimposed infection not excluded. No nephrolithiasis. No hydronephrosis. No hydroureter. Bladder within normal limits. Stomach/Bowel: Stomach within normal limits. No evidence for bowel obstruction. Appendix well visualized within the lower mid abdomen and is within normal limits without associated inflammatory changes to suggest acute appendicitis. No acute inflammatory changes seen about the bowels. Vascular/Lymphatic: Intra-abdominal aorta of normal caliber. No pathologically enlarged intra-abdominal or pelvic lymph nodes. Reproductive: Prostate normal. Other: No free intraperitoneal air.  No free fluid. Musculoskeletal: No acute osseous abnormality. No worrisome lytic or blastic osseous lesions. IMPRESSION: 1. Enlarged kidneys with innumerable cysts present bilaterally, compatible with history of polycystic kidney disease. Mild asymmetric hazy stranding about the posterior/inferior aspect of the left kidney, felt to most likely be related to possible recent hemorrhage within the cyst. Superimposed infection not entirely excluded. Correlation with urinalysis recommended. 2. No other acute intra-abdominal or pelvic process. Electronically Signed   By: Jeannine Boga M.D.   On:  05/23/2016 00:38    EKG: Independently reviewed. Normal sinus rhythm with LVH.  Assessment/Plan Principal Problem:   Pyelonephritis Active Problems:   Hypertension   CKD (chronic kidney disease) stage 3, GFR 30-59 ml/min   Adult polycystic kidney disease    1. SIRS probably is related to hemorrhage into patient's known cystic kidney disease and also pyelonephritis - patient has been placed on ceftriaxone. Follow cultures. Since patient also may be having hemorrhage into the kidney cyst follow CBC. 2. Hypertension - patient has not been taking any medications. Closely follow blood pressure trends. I have placed  patient on when necessary IV hydralazine. 3. Chronic kidney disease stage III with history of adult polycystic kidney disease - follow metabolic panel.   DVT prophylaxis: SCDs. Code Status: Full code.  Family Communication: Discussed with patient.  Disposition Plan: Home.  Consults called: None.  Admission status: Observation.    Rise Patience MD [facility] Pager [phone].  If 7PM-7AM, please contact night-coverage [email or web address] Password [email or web address]  05/23/2016, 1:33 AM

## 2016-05-23 NOTE — Progress Notes (Signed)
Patient arrived onto floor and transferred into the room with ED nurse at bedside. Alert and oriented x4. Denies any respiratory distress. States of some left abdominal pain and discomfort and wanting to take out eye contacts for the night. Bed in low position with call light and personal items within reach. Denies any urgent needs.

## 2016-05-24 DIAGNOSIS — M109 Gout, unspecified: Secondary | ICD-10-CM

## 2016-05-24 DIAGNOSIS — N183 Chronic kidney disease, stage 3 (moderate): Secondary | ICD-10-CM

## 2016-05-24 DIAGNOSIS — N179 Acute kidney failure, unspecified: Secondary | ICD-10-CM

## 2016-05-24 LAB — BASIC METABOLIC PANEL
Anion gap: 7 (ref 5–15)
BUN: 24 mg/dL — ABNORMAL HIGH (ref 6–20)
CO2: 22 mmol/L (ref 22–32)
Calcium: 8.3 mg/dL — ABNORMAL LOW (ref 8.9–10.3)
Chloride: 106 mmol/L (ref 101–111)
Creatinine, Ser: 2.34 mg/dL — ABNORMAL HIGH (ref 0.61–1.24)
GFR calc Af Amer: 38 mL/min — ABNORMAL LOW (ref >60)
GFR calc non Af Amer: 33 mL/min — ABNORMAL LOW (ref >60)
Glucose, Bld: 97 mg/dL (ref 65–99)
Potassium: 4.4 mmol/L (ref 3.5–5.1)
Sodium: 135 mmol/L (ref 135–145)

## 2016-05-24 LAB — URINE CULTURE: Culture: NO GROWTH

## 2016-05-24 MED ORDER — HYDRALAZINE HCL 25 MG PO TABS
25.0000 mg | ORAL_TABLET | Freq: Three times a day (TID) | ORAL | Status: DC
  Administered 2016-05-24 – 2016-05-25 (×4): 25 mg via ORAL
  Filled 2016-05-24 (×4): qty 1

## 2016-05-24 MED ORDER — SODIUM CHLORIDE 0.9 % IV SOLN
INTRAVENOUS | Status: DC
  Administered 2016-05-24: 1000 mL via INTRAVENOUS

## 2016-05-24 MED ORDER — PREDNISONE 20 MG PO TABS
40.0000 mg | ORAL_TABLET | Freq: Every day | ORAL | Status: DC
  Administered 2016-05-25: 40 mg via ORAL
  Filled 2016-05-24: qty 2

## 2016-05-24 NOTE — Progress Notes (Signed)
Notified MD Dhungel of bright red blood in urine and fever of 101.95F.

## 2016-05-24 NOTE — Progress Notes (Addendum)
PROGRESS NOTE                                                                                                                                                                                                             Patient Demographics:    Michael [name], is a 41 y.o. male, DOB - 03/31/1975, WUJ:[id number]  Admit date - 05/22/2016   Admitting Physician Rise Patience, MD  Outpatient Primary MD for the patient is No PCP Per Patient  LOS - 1  Outpatient Specialists: none  Chief Complaint  Patient presents with  . Abdominal Pain       Brief Narrative   41 year old male with history of polycystic kidney disease and hypertension who has not seen a doctor in the last 3 years or been on medications presented with SIRS secondary to UTI with left pyelonephritis. His presenting symptoms were left flank pain for the past 4-5 days followed by fevers with chills. In the ED he was tachycardic with fever of 101.49F and mild leukocytosis. CT scan of the abdomen showed left-sided haziness around the kidneys with possible hemorrhage into the cyst but infection could not be ruled out. Placed on observation.   Subjective:   abd pain better. C/l pain in left ankle typical of gout flare. Had fever of 101F overnight   Assessment  & Plan :    Principal Problem:   SIRS (systemic inflammatory response syndrome) (HCC)  Secondary to Acute Pyelonephritis. Continue empiric IV Rocephin. Follow urine culture. Continue IV hydration. Supportive care with Tylenol and pain medications.  Active Problems: Essential hypertension Not on any medications. Started him on hydralazine so that he can afford them.    ?Acute on CKD (chronic kidney disease) stage 3, GFR 30-59 ml/min No recent labs in the system. Last labs from June 2016 with creatinine of 2.1. Patient does not follow with any on as outpatient. Prerenal versus baseline renal function.  He'll  need outpatient referral to nephrology. Avoid nephrotoxins.  ? acute gouty arthritis Place him on empiric oral prednisone x5 days    Adult polycystic kidney disease   Patient needs to be established care as outpatient. Care management consulted.       Code Status : Full code  Family Communication  : None at bedside  Disposition Plan  : Home once improved, possibly in the  next 24- hours if afebrile  Barriers For Discharge : Active symptoms  Consults  :  None  Procedures  : CT abdomen and pelvis  DVT Prophylaxis  :  Lovenox -   Lab Results  Component Value Date   PLT 276 05/23/2016    Antibiotics  :    Anti-infectives    Start     Dose/Rate Route Frequency Ordered Stop   05/23/16 2200  cefTRIAXone (ROCEPHIN) 1 g in dextrose 5 % 50 mL IVPB     1 g 100 mL/hr over 30 Minutes Intravenous Every 24 hours 05/23/16 0142     05/23/16 0045  cefTRIAXone (ROCEPHIN) 2 g in dextrose 5 % 50 mL IVPB     2 g 100 mL/hr over 30 Minutes Intravenous  Once 05/23/16 0041 05/23/16 0154        Objective:   Vitals:   05/23/16 2149 05/24/16 0104 05/24/16 0528 05/24/16 0958  BP: (!) 145/81  (!) 165/89 (!) 149/82  Pulse: (!) 101  (!) 103 70  Resp: 19  20 20   Temp: (!) 100.6 F (38.1 C) 98.7 F (37.1 C) (!) 100.7 F (38.2 C) 98 F (36.7 C)  TempSrc: Oral Axillary Oral Oral  SpO2: 97%  97% 100%  Weight: 119.6 kg (263 lb 10.7 oz)     Height:        Wt Readings from Last 3 Encounters:  05/23/16 119.6 kg (263 lb 10.7 oz)  06/29/15 118.8 kg (262 lb)  08/26/14 122.5 kg (270 lb)     Intake/Output Summary (Last 24 hours) at 05/24/16 1212 Last data filed at 05/24/16 0900  Gross per 24 hour  Intake             2210 ml  Output             1650 ml  Net              560 ml     Physical Exam  Gen: not in distress HEENT:  moist mucosa, supple neck Chest: clear b/l, no added sounds CVS: N S1&S2, no murmurs,  GI: soft, Nondistended,non tender,  bowel sounds  present Musculoskeletal: warm, no edema, rt ankle tenderness, no warmth or swelling     Data Review:    CBC  Recent Labs Lab 05/22/16 1926 05/22/16 2025 05/23/16 0707  WBC  --  14.7* 13.5*  HGB 13.9 12.8* 12.7*  HCT 41.0 39.1 39.4  PLT  --  295 276  MCV  --  81.1 82.4  MCH  --  26.6 26.6  MCHC  --  32.7 32.2  RDW  --  14.3 14.8    Chemistries   Recent Labs Lab 05/22/16 1926 05/22/16 2025 05/23/16 0707 05/24/16 0647  NA 140 135 136 135  K 4.4 4.1 4.2 4.4  CL 103 102 103 106  CO2  --  22 23 22   GLUCOSE 100* 99 108* 97  BUN 28* 27* 27* 24*  CREATININE 2.20* 2.31* 2.26* 2.34*  CALCIUM  --  9.0 8.7* 8.3*  AST  --  20  --   --   ALT  --  20  --   --   ALKPHOS  --  53  --   --   BILITOT  --  1.3*  --   --    ------------------------------------------------------------------------------------------------------------------ No results for input(s): CHOL, HDL, LDLCALC, TRIG, CHOLHDL, LDLDIRECT in the last 72 hours.  No results found for: HGBA1C ------------------------------------------------------------------------------------------------------------------ No results for input(s):  TSH, T4TOTAL, T3FREE, THYROIDAB in the last 72 hours.  Invalid input(s): FREET3 ------------------------------------------------------------------------------------------------------------------ No results for input(s): VITAMINB12, FOLATE, FERRITIN, TIBC, IRON, RETICCTPCT in the last 72 hours.  Coagulation profile No results for input(s): INR, PROTIME in the last 168 hours.  No results for input(s): DDIMER in the last 72 hours.  Cardiac Enzymes No results for input(s): CKMB, TROPONINI, MYOGLOBIN in the last 168 hours.  Invalid input(s): CK ------------------------------------------------------------------------------------------------------------------ No results found for: BNP  Inpatient Medications  Scheduled Meds: . cefTRIAXone (ROCEPHIN)  IV  1 g Intravenous Q24H  .  hydrALAZINE  25 mg Oral Q8H  . Influenza vac split quadrivalent PF  0.5 mL Intramuscular Tomorrow-1000  . pneumococcal 23 valent vaccine  0.5 mL Intramuscular Tomorrow-1000  . [START ON 05/25/2016] predniSONE  40 mg Oral Q breakfast   Continuous Infusions:  PRN Meds:.acetaminophen **OR** acetaminophen, hydrALAZINE, morphine injection, ondansetron **OR** ondansetron (ZOFRAN) IV  Micro Results Recent Results (from the past 240 hour(s))  Urine culture     Status: None   Collection Time: 05/22/16  8:28 PM  Result Value Ref Range Status   Specimen Description URINE, CLEAN CATCH  Final   Special Requests NONE  Final   Culture NO GROWTH  Final   Report Status 05/24/2016 FINAL  Final    Radiology Reports Ct Abdomen Pelvis Wo Contrast  Result Date: 05/23/2016 CLINICAL DATA:  Initial evaluation for acute fever with left-sided abdominal pain. History polycystic kidney disease. EXAM: CT ABDOMEN AND PELVIS WITHOUT CONTRAST TECHNIQUE: Multidetector CT imaging of the abdomen and pelvis was performed following the standard protocol without IV contrast. COMPARISON:  Prior CT from 12/02/2009. FINDINGS: Lower chest: Visualized lung bases are clear. Hepatobiliary: Liver demonstrates a normal unenhanced appearance. Gallbladder within normal limits. No biliary dilatation. Pancreas: Pancreas within normal limits. Spleen: Spleen within normal limits. Adrenals/Urinary Tract: Adrenal glands are normal. Kidneys are markedly enlarged with innumerable cysts present, compatible with history polycystic kidney disease. Several hyperdense lesions scattered throughout the kidneys, likely proteinaceous and/ or hemorrhagic cysts. There is mild asymmetric hazy stranding about the posterior/ inferior aspect of the left kidney, suspected to most likely be related to possible recent hemorrhage of a cyst. Superimposed infection not excluded. No nephrolithiasis. No hydronephrosis. No hydroureter. Bladder within normal limits.  Stomach/Bowel: Stomach within normal limits. No evidence for bowel obstruction. Appendix well visualized within the lower mid abdomen and is within normal limits without associated inflammatory changes to suggest acute appendicitis. No acute inflammatory changes seen about the bowels. Vascular/Lymphatic: Intra-abdominal aorta of normal caliber. No pathologically enlarged intra-abdominal or pelvic lymph nodes. Reproductive: Prostate normal. Other: No free intraperitoneal air.  No free fluid. Musculoskeletal: No acute osseous abnormality. No worrisome lytic or blastic osseous lesions. IMPRESSION: 1. Enlarged kidneys with innumerable cysts present bilaterally, compatible with history of polycystic kidney disease. Mild asymmetric hazy stranding about the posterior/inferior aspect of the left kidney, felt to most likely be related to possible recent hemorrhage within the cyst. Superimposed infection not entirely excluded. Correlation with urinalysis recommended. 2. No other acute intra-abdominal or pelvic process. Electronically Signed   By: Jeannine Boga M.D.   On: 05/23/2016 00:38    Time Spent in minutes  35   Louellen Molder M.D on 05/24/2016 at 12:12 PM  Between 7am to 7pm - Pager - [phone]  After 7pm go to [email or web address] - password [facility]  [facility] -  Office  [phone]

## 2016-05-25 DIAGNOSIS — M109 Gout, unspecified: Secondary | ICD-10-CM | POA: Diagnosis present

## 2016-05-25 DIAGNOSIS — E6609 Other obesity due to excess calories: Secondary | ICD-10-CM | POA: Diagnosis present

## 2016-05-25 DIAGNOSIS — R319 Hematuria, unspecified: Secondary | ICD-10-CM | POA: Diagnosis not present

## 2016-05-25 DIAGNOSIS — I10 Essential (primary) hypertension: Secondary | ICD-10-CM | POA: Diagnosis present

## 2016-05-25 DIAGNOSIS — N10 Acute tubulo-interstitial nephritis: Secondary | ICD-10-CM | POA: Diagnosis present

## 2016-05-25 DIAGNOSIS — R3129 Other microscopic hematuria: Secondary | ICD-10-CM

## 2016-05-25 DIAGNOSIS — Z6834 Body mass index (BMI) 34.0-34.9, adult: Secondary | ICD-10-CM | POA: Diagnosis present

## 2016-05-25 LAB — CBC
HCT: 35.6 % — ABNORMAL LOW (ref 39.0–52.0)
Hemoglobin: 11.7 g/dL — ABNORMAL LOW (ref 13.0–17.0)
MCH: 26.6 pg (ref 26.0–34.0)
MCHC: 32.9 g/dL (ref 30.0–36.0)
MCV: 80.9 fL (ref 78.0–100.0)
Platelets: 313 10*3/uL (ref 150–400)
RBC: 4.4 MIL/uL (ref 4.22–5.81)
RDW: 14 % (ref 11.5–15.5)
WBC: 16.8 10*3/uL — ABNORMAL HIGH (ref 4.0–10.5)

## 2016-05-25 LAB — BASIC METABOLIC PANEL
Anion gap: 9 (ref 5–15)
BUN: 25 mg/dL — ABNORMAL HIGH (ref 6–20)
CO2: 22 mmol/L (ref 22–32)
Calcium: 8.5 mg/dL — ABNORMAL LOW (ref 8.9–10.3)
Chloride: 103 mmol/L (ref 101–111)
Creatinine, Ser: 2.21 mg/dL — ABNORMAL HIGH (ref 0.61–1.24)
GFR calc Af Amer: 41 mL/min — ABNORMAL LOW (ref >60)
GFR calc non Af Amer: 35 mL/min — ABNORMAL LOW (ref >60)
Glucose, Bld: 116 mg/dL — ABNORMAL HIGH (ref 65–99)
Potassium: 4.4 mmol/L (ref 3.5–5.1)
Sodium: 134 mmol/L — ABNORMAL LOW (ref 135–145)

## 2016-05-25 MED ORDER — HYDROCODONE-ACETAMINOPHEN 5-325 MG PO TABS: 2.0000 | ORAL_TABLET | Freq: Four times a day (QID) | ORAL | 0 refills | Status: DC | PRN

## 2016-05-25 MED ORDER — ATENOLOL 25 MG PO TABS
25.0000 mg | ORAL_TABLET | Freq: Every day | ORAL | Status: DC
  Administered 2016-05-25: 25 mg via ORAL
  Filled 2016-05-25: qty 1

## 2016-05-25 MED ORDER — CIPROFLOXACIN HCL 250 MG PO TABS
250.0000 mg | ORAL_TABLET | Freq: Two times a day (BID) | ORAL | 0 refills | Status: AC
Start: 2016-05-25 — End: 2016-06-01

## 2016-05-25 MED ORDER — HYDRALAZINE HCL 50 MG PO TABS: 50.0000 mg | ORAL_TABLET | Freq: Three times a day (TID) | ORAL | 0 refills | Status: DC

## 2016-05-25 MED ORDER — PREDNISONE 20 MG PO TABS: 40.0000 mg | ORAL_TABLET | Freq: Every day | ORAL | 0 refills | Status: DC

## 2016-05-25 MED ORDER — PREDNISONE 20 MG PO TABS: 40.0000 mg | ORAL_TABLET | Freq: Every day | ORAL | 0 refills | Status: AC

## 2016-05-25 MED ORDER — ATENOLOL 25 MG PO TABS: 25.0000 mg | ORAL_TABLET | Freq: Every day | ORAL | 0 refills | Status: DC

## 2016-05-25 MED ORDER — HYDRALAZINE HCL 50 MG PO TABS: 50.0000 mg | ORAL_TABLET | Freq: Three times a day (TID) | ORAL | Status: DC

## 2016-05-25 NOTE — Care Management Note (Signed)
Case Management Note  Patient Details  Name: Michael [name] MRN: [id number] Date of Birth: [birth year]  Subjective/Objective:    CM following for progression and d/c planning.                 Action/Plan: 05/25/2016 Per MD request pt scheduled for followup appointment at [facility] for hospital followup . [facility] has no available appointment times. Pt appointment if for Tuesday, June 19, 2016 @ 10:30 am. Will follow for possible medication needs.   Expected Discharge Date:       05/25/2016           Expected Discharge Plan:  Home/Self Care  In-House Referral:  NA  Discharge planning Services  CM Consult, [facility], Medication Assistance  Post Acute Care Choice:  NA Choice offered to:   NA  DME Arranged:   NA DME Agency:   NA  HH Arranged:   NA HH Agency:   NA  Status of Service:  Completed, signed off  If discussed at [place] of Stay Meetings, dates discussed:    Additional Comments:  Adron Bene, RN 05/25/2016, 11:07 AM

## 2016-05-25 NOTE — Progress Notes (Signed)
Michael [name] to be D/C'd Home per MD order.  Discussed prescriptions and follow up appointments with the patient. Prescriptions given to patient, medication list explained in detail. Pt verbalized understanding.  Allergies as of 05/25/2016      Reactions   Shellfish Allergy Anaphylaxis      Medication List    STOP taking these medications   ibuprofen 200 MG tablet Commonly known as:  ADVIL,MOTRIN     TAKE these medications   atenolol 25 MG tablet Commonly known as:  TENORMIN Take 1 tablet (25 mg total) by mouth daily. Start taking on:  05/26/2016   ciprofloxacin 250 MG tablet Commonly known as:  CIPRO Take 1 tablet (250 mg total) by mouth 2 (two) times daily.   hydrALAZINE 50 MG tablet Commonly known as:  APRESOLINE Take 1 tablet (50 mg total) by mouth every 8 (eight) hours.   HYDROcodone-acetaminophen 5-325 MG tablet Commonly known as:  NORCO/VICODIN Take 2 tablets by mouth every 6 (six) hours as needed for moderate pain.   predniSONE 20 MG tablet Commonly known as:  DELTASONE Take 2 tablets (40 mg total) by mouth daily with breakfast. Start taking on:  05/26/2016       Vitals:   05/25/16 1356 05/25/16 1514  BP: (!) 164/102 135/60  Pulse: 97 81  Resp:    Temp:      Skin clean, dry and intact without evidence of skin break down, no evidence of skin tears noted. IV catheter discontinued intact. Site without signs and symptoms of complications. Dressing and pressure applied. Pt denies pain at this time. No complaints noted.  An After Visit Summary was printed and given to the patient. Patient escorted via [place], and D/C home via private auto.  Emilio Math, RN [facility] Phone [phone]

## 2016-05-25 NOTE — Discharge Instructions (Addendum)
DASH Eating Plan DASH stands for "Dietary Approaches to Stop Hypertension." The DASH eating plan is a healthy eating plan that has been shown to reduce high blood pressure (hypertension). It may also reduce your risk for type 2 diabetes, heart disease, and stroke. The DASH eating plan may also help with weight loss. What are tips for following this plan? General guidelines  Avoid eating more than 2,300 mg (milligrams) of salt (sodium) a day. If you have hypertension, you may need to reduce your sodium intake to 1,500 mg a day. Limit alcohol intake to no more than 1 drink a day for nonpregnant women and 2 drinks a day for men. One drink equals 12 oz of beer, 5 oz of wine, or 1 oz of hard liquor. Work with your health care provider to maintain a healthy body weight or to lose weight. Ask what an ideal weight is for you. Get at least 30 minutes of exercise that causes your heart to beat faster (aerobic exercise) most days of the week. Activities may include walking, swimming, or biking. Work with your health care provider or diet and nutrition specialist (dietitian) to adjust your eating plan to your individual calorie needs. Reading food labels  Check food labels for the amount of sodium per serving. Choose foods with less than 5 percent of the Daily Value of sodium. Generally, foods with less than 300 mg of sodium per serving fit into this eating plan. To find whole grains, look for the word "whole" as the first word in the ingredient list. Shopping  Buy products labeled as "low-sodium" or "no salt added." Buy fresh foods. Avoid canned foods and premade or frozen meals. Cooking  Avoid adding salt when cooking. Use salt-free seasonings or herbs instead of table salt or sea salt. Check with your health care provider or pharmacist before using salt substitutes. Do not fry foods. Cook foods using healthy methods such as baking, boiling, grilling, and broiling instead. Cook with heart-healthy oils, such  as olive, canola, soybean, or sunflower oil. Meal planning   Eat a balanced diet that includes: 5 or more servings of fruits and vegetables each day. At each meal, try to fill half of your plate with fruits and vegetables. Up to 6-8 servings of whole grains each day. Less than 6 oz of lean meat, poultry, or fish each day. A 3-oz serving of meat is about the same size as a deck of cards. One egg equals 1 oz. 2 servings of low-fat dairy each day. A serving of nuts, seeds, or beans 5 times each week. Heart-healthy fats. Healthy fats called Omega-3 fatty acids are found in foods such as flaxseeds and coldwater fish, like sardines, salmon, and mackerel. Limit how much you eat of the following: Canned or prepackaged foods. Food that is high in trans fat, such as fried foods. Food that is high in saturated fat, such as fatty meat. Sweets, desserts, sugary drinks, and other foods with added sugar. Full-fat dairy products. Do not salt foods before eating. Try to eat at least 2 vegetarian meals each week. Eat more home-cooked food and less restaurant, buffet, and fast food. When eating at a restaurant, ask that your food be prepared with less salt or no salt, if possible. What foods are recommended? The items listed may not be a complete list. Talk with your dietitian about what dietary choices are best for you. Grains  Whole-grain or whole-wheat bread. Whole-grain or whole-wheat pasta. Brown rice. [name]. Bulgur. Whole-grain and low-sodium  cereals. Pita bread. Low-fat, low-sodium crackers. Whole-wheat flour tortillas. Vegetables  Fresh or frozen vegetables (raw, steamed, roasted, or grilled). Low-sodium or reduced-sodium tomato and vegetable juice. Low-sodium or reduced-sodium tomato sauce and tomato paste. Low-sodium or reduced-sodium canned vegetables. Fruits  All fresh, dried, or frozen fruit. Canned fruit in natural juice (without added sugar). Meat and other protein foods  Skinless  chicken or [place]. Ground chicken or [place]. Pork with fat trimmed off. Fish and seafood. Egg whites. Dried beans, peas, or lentils. Unsalted nuts, nut butters, and seeds. Unsalted canned beans. Lean cuts of beef with fat trimmed off. Low-sodium, lean deli meat. Dairy  Low-fat (1%) or fat-free (skim) milk. Fat-free, low-fat, or reduced-fat cheeses. Nonfat, low-sodium ricotta or cottage cheese. Low-fat or nonfat yogurt. Low-fat, low-sodium cheese. Fats and oils  Soft margarine without trans fats. Vegetable oil. Low-fat, reduced-fat, or light mayonnaise and salad dressings (reduced-sodium). Canola, safflower, olive, soybean, and sunflower oils. Avocado. Seasoning and other foods  Herbs. Spices. Seasoning mixes without salt. Unsalted popcorn and pretzels. Fat-free sweets. What foods are not recommended? The items listed may not be a complete list. Talk with your dietitian about what dietary choices are best for you. Grains  Baked goods made with fat, such as croissants, muffins, or some breads. Dry pasta or rice meal packs. Vegetables  Creamed or fried vegetables. Vegetables in a cheese sauce. Regular canned vegetables (not low-sodium or reduced-sodium). Regular canned tomato sauce and paste (not low-sodium or reduced-sodium). Regular tomato and vegetable juice (not low-sodium or reduced-sodium). [name]. Olives. Fruits  Canned fruit in a light or heavy syrup. Fried fruit. Fruit in cream or butter sauce. Meat and other protein foods  Fatty cuts of meat. Ribs. Fried meat. [name]. Sausage. Bologna and other processed lunch meats. Salami. Fatback. Hotdogs. Bratwurst. Salted nuts and seeds. Canned beans with added salt. Canned or smoked fish. Whole eggs or egg yolks. Chicken or [place] with skin. Dairy  Whole or 2% milk, cream, and half-and-half. Whole or full-fat cream cheese. Whole-fat or sweetened yogurt. Full-fat cheese. Nondairy creamers. Whipped toppings. Processed cheese and cheese spreads. Fats and  oils  Butter. Stick margarine. Lard. Shortening. Ghee. Bacon fat. Tropical oils, such as coconut, palm kernel, or palm oil. Seasoning and other foods  Salted popcorn and pretzels. Onion salt, garlic salt, seasoned salt, table salt, and sea salt. Worcestershire sauce. Tartar sauce. Barbecue sauce. Teriyaki sauce. Soy sauce, including reduced-sodium. Steak sauce. Canned and packaged gravies. Fish sauce. Oyster sauce. Cocktail sauce. Horseradish that you find on the shelf. Ketchup. Mustard. Meat flavorings and tenderizers. Bouillon cubes. Hot sauce and Tabasco sauce. Premade or packaged marinades. Premade or packaged taco seasonings. Relishes. Regular salad dressings. Where to find more information: National Heart, Lung, and [place]: https://wilson-eaton.com/ American Heart Association: www.heart.org Summary The DASH eating plan is a healthy eating plan that has been shown to reduce high blood pressure (hypertension). It may also reduce your risk for type 2 diabetes, heart disease, and stroke. With the DASH eating plan, you should limit salt (sodium) intake to 2,300 mg a day. If you have hypertension, you may need to reduce your sodium intake to 1,500 mg a day. When on the DASH eating plan, aim to eat more fresh fruits and vegetables, whole grains, lean proteins, low-fat dairy, and heart-healthy fats. Work with your health care provider or diet and nutrition specialist (dietitian) to adjust your eating plan to your individual calorie needs. This information is not intended to replace advice given to you by your health  care provider. Make sure you discuss any questions you have with your health care provider. Document Released: 02/22/2011 Document Revised: 02/27/2016 Document Reviewed: 02/27/2016 Elsevier Interactive Patient Education  2017 Elsevier Inc. Pyelonephritis, Adult Pyelonephritis is a kidney infection. The kidneys are organs that help clean your blood by moving waste out of your blood and  into your pee (urine). This infection can happen quickly, or it can last for a long time. In most cases, it clears up with treatment and does not cause other problems. Follow these instructions at home: Medicines   Take over-the-counter and prescription medicines only as told by your doctor.  Take your antibiotic medicine as told by your doctor. Do not stop taking the medicine even if you start to feel better. General instructions   Drink enough fluid to keep your pee clear or pale yellow.  Avoid caffeine, tea, and carbonated drinks.  Pee (urinate) often. Avoid holding in pee for long periods of time.  Pee before and after sex.  After pooping (having a bowel movement), women should wipe from front to back. Use each tissue only once.  Keep all follow-up visits as told by your doctor. This is important. Contact a doctor if:  You do not feel better after 2 days.  Your symptoms get worse.  You have a fever. Get help right away if:  You cannot take your medicine or drink fluids as told.  You have chills and shaking.  You throw up (vomit).  You have very bad pain in your side (flank) or back.  You feel very weak or you pass out (faint). This information is not intended to replace advice given to you by your health care provider. Make sure you discuss any questions you have with your health care provider. Document Released: 04/12/2004 Document Revised: 08/11/2015 Document Reviewed: 06/28/2014 Elsevier Interactive Patient Education  2017 [employer].

## 2016-05-25 NOTE — Discharge Summary (Addendum)
Physician Discharge Summary  Michael [name] TKZ:[id number] DOB: 30-Nov-1975 DOA: 05/22/2016  PCP: No PCP Per Patient  Admit date: 05/22/2016 Discharge date: 05/25/2016  Admitted From: home Disposition:  home  Recommendations for Outpatient Follow-up:  #1 patient will complete a total 10 days of antibiotics on 3/16. #2 [facility]. #3 He will need outpatient referral to nephrology and sleep studies for possible OSA.  Home Health:none Equipment/Devices: None  Discharge Condition: Stable CODE STATUS: full Code Diet recommendation: low Sodium    Discharge Diagnoses:  Principal Problem:   Acute pyelonephritis   Active Problems:   Hypertension   CKD (chronic kidney disease) stage 3, GFR 30-59 ml/min   Adult polycystic kidney disease   SIRS (systemic inflammatory response syndrome) (HCC)   Acute gouty arthritis   Hematuria   Essential hypertension, benign   Morbid obesity ([place])  Brief narrative/history of present illness 41 year old male with history of polycystic kidney disease and hypertension who has not seen a doctor in the last 3 years or been on medications presented with SIRS secondary to UTI with left pyelonephritis. His presenting symptoms were left flank pain for the past 4-5 days followed by fevers with chills. In the ED he was tachycardic with fever of 101.9F and mild leukocytosis. CT scan of the abdomen showed left-sided haziness around the kidneys with possible hemorrhage into the cyst but infection could not be ruled out. Placed on observation.   Hospital course Principal Problem:   SIRS (systemic inflammatory response syndrome) (HCC) Present on admission.  Secondary to Acute Pyelonephritis. Placed on empiric IV Rocephin.. Given IV hydration. Urine culture without any growth. Symptoms improved. Remains afebrile past 24 hours. No further abdominal pain. Does report dark urine and noted to have hematuria on UA. H&H  stable.  Patient will be discharged on oral ciprofloxacin to complete total ten-day course of antibiotics.     Active Problems: Essential hypertension Not on any medications. Started him on hydralazine and atenolol. Instructed on low sodium diet. Provided education resource.     ?Acute on CKD (chronic kidney disease) stage 3, GFR 30-59 ml/min No recent labs in the system. Last labs from June 2016 with creatinine of 2.1. -Prerenal versus baseline renal function. He will need outpatient referral to nephrology. Patient reports taking ibuprofen for pain frequently. Instructed clearly on avoiding any form of nephrotoxins. Monitor renal function during outpatient visit.  acute gouty arthritis of left foot Treating with empiric oral prednisone x5 days. Symptoms improved today.    Hematuria Possibly associated with pyelonephritis. H&H stable.  Morbid obesity BMI of 33.6. Counseled on diet and exercise.  ?OSA Patient reports frequent snoring. Recommend outpatient referral to [facility] studies.    Adult polycystic kidney disease    Patient is clinically stable to be discharged home with outpatient follow-up.  Family Communication  : None at bedside  Disposition Plan  : Home   Consults  :  None  Procedures  : CT abdomen and pelvis    Discharge Instructions   Allergies as of 05/25/2016      Reactions   Shellfish Allergy Anaphylaxis      Medication List    STOP taking these medications   ibuprofen 200 MG tablet Commonly known as:  ADVIL,MOTRIN     TAKE these medications   atenolol 25 MG tablet Commonly known as:  TENORMIN Take 1 tablet (25 mg total) by mouth daily. Start taking on:  05/26/2016   ciprofloxacin 250 MG tablet Commonly  known as:  CIPRO Take 1 tablet (250 mg total) by mouth 2 (two) times daily.   hydrALAZINE 50 MG tablet Commonly known as:  APRESOLINE Take 1 tablet (50 mg total) by mouth every 8 (eight) hours.   HYDROcodone-acetaminophen  5-325 MG tablet Commonly known as:  NORCO/VICODIN Take 2 tablets by mouth every 6 (six) hours as needed for moderate pain.   predniSONE 20 MG tablet Commonly known as:  DELTASONE Take 2 tablets (40 mg total) by mouth daily with breakfast. Start taking on:  05/26/2016 until 05/29/2016      Follow-up [place]. Go to.   Why:  Appointment : Tuesday, June 19, 2016 at 10:30 am.  Please call if you are unable to keep this appointment [phone]. Contact information: [place] [address]         Allergies  Allergen Reactions  . Shellfish Allergy Anaphylaxis        Procedures/Studies: Ct Abdomen Pelvis Wo Contrast  Result Date: 05/23/2016 CLINICAL DATA:  Initial evaluation for acute fever with left-sided abdominal pain. History polycystic kidney disease. EXAM: CT ABDOMEN AND PELVIS WITHOUT CONTRAST TECHNIQUE: Multidetector CT imaging of the abdomen and pelvis was performed following the standard protocol without IV contrast. COMPARISON:  Prior CT from 12/02/2009. FINDINGS: Lower chest: Visualized lung bases are clear. Hepatobiliary: Liver demonstrates a normal unenhanced appearance. Gallbladder within normal limits. No biliary dilatation. Pancreas: Pancreas within normal limits. Spleen: Spleen within normal limits. Adrenals/Urinary Tract: Adrenal glands are normal. Kidneys are markedly enlarged with innumerable cysts present, compatible with history polycystic kidney disease. Several hyperdense lesions scattered throughout the kidneys, likely proteinaceous and/ or hemorrhagic cysts. There is mild asymmetric hazy stranding about the posterior/ inferior aspect of the left kidney, suspected to most likely be related to possible recent hemorrhage of a cyst. Superimposed infection not excluded. No nephrolithiasis. No hydronephrosis. No hydroureter. Bladder within normal limits. Stomach/Bowel: Stomach within normal limits. No  evidence for bowel obstruction. Appendix well visualized within the lower mid abdomen and is within normal limits without associated inflammatory changes to suggest acute appendicitis. No acute inflammatory changes seen about the bowels. Vascular/Lymphatic: Intra-abdominal aorta of normal caliber. No pathologically enlarged intra-abdominal or pelvic lymph nodes. Reproductive: Prostate normal. Other: No free intraperitoneal air.  No free fluid. Musculoskeletal: No acute osseous abnormality. No worrisome lytic or blastic osseous lesions. IMPRESSION: 1. Enlarged kidneys with innumerable cysts present bilaterally, compatible with history of polycystic kidney disease. Mild asymmetric hazy stranding about the posterior/inferior aspect of the left kidney, felt to most likely be related to possible recent hemorrhage within the cyst. Superimposed infection not entirely excluded. Correlation with urinalysis recommended. 2. No other acute intra-abdominal or pelvic process. Electronically Signed   By: Jeannine Boga M.D.   On: 05/23/2016 00:38    (Echo, Carotid, EGD, Colonoscopy, ERCP)    Subjective:   Discharge Exam: Vitals:   05/25/16 1356 05/25/16 1514  BP: (!) 164/102 135/60  Pulse: 97 81  Resp:    Temp:     Vitals:   05/24/16 2037 05/25/16 0425 05/25/16 1356 05/25/16 1514  BP: (!) 167/95 140/86 (!) 164/102 135/60  Pulse: 100 92 97 81  Resp: 19 20    Temp: 98.8 F (37.1 C) 98.6 F (37 C)    TempSrc: Oral Oral    SpO2: 100% 97%    Weight: 119.1 kg (262 lb 8 oz)     Height:  Gen: not in distress HEENT:  moist mucosa, supple neck Chest: clear b/l, no added sounds CVS: N S1&S2, no murmurs,  GI: soft, Nondistended,non tender,  bowel sounds present Musculoskeletal: warm, no edema, minimal rt ankle tenderness, no warmth or swelling   The results of significant diagnostics from this hospitalization (including imaging, microbiology, ancillary and laboratory) are listed below for  reference.     Microbiology: Recent Results (from the past 240 hour(s))  Urine culture     Status: None   Collection Time: 05/22/16  8:28 PM  Result Value Ref Range Status   Specimen Description URINE, CLEAN CATCH  Final   Special Requests NONE  Final   Culture NO GROWTH  Final   Report Status 05/24/2016 FINAL  Final     Labs: BNP (last 3 results) No results for input(s): BNP in the last 8760 hours. Basic Metabolic Panel:  Recent Labs Lab 05/22/16 1926 05/22/16 2025 05/23/16 0707 05/24/16 0647 05/25/16 0600  NA 140 135 136 135 134*  K 4.4 4.1 4.2 4.4 4.4  CL 103 102 103 106 103  CO2  --  22 23 22 22   GLUCOSE 100* 99 108* 97 116*  BUN 28* 27* 27* 24* 25*  CREATININE 2.20* 2.31* 2.26* 2.34* 2.21*  CALCIUM  --  9.0 8.7* 8.3* 8.5*   Liver Function Tests:  Recent Labs Lab 05/22/16 2025  AST 20  ALT 20  ALKPHOS 53  BILITOT 1.3*  PROT 8.1  ALBUMIN 3.9    Recent Labs Lab 05/22/16 2025  LIPASE 24   No results for input(s): AMMONIA in the last 168 hours. CBC:  Recent Labs Lab 05/22/16 1926 05/22/16 2025 05/23/16 0707 05/25/16 0917  WBC  --  14.7* 13.5* 16.8*  HGB 13.9 12.8* 12.7* 11.7*  HCT 41.0 39.1 39.4 35.6*  MCV  --  81.1 82.4 80.9  PLT  --  295 276 313   Cardiac Enzymes: No results for input(s): CKTOTAL, CKMB, CKMBINDEX, TROPONINI in the last 168 hours. BNP: Invalid input(s): POCBNP CBG: No results for input(s): GLUCAP in the last 168 hours. D-Dimer No results for input(s): DDIMER in the last 72 hours. Hgb A1c No results for input(s): HGBA1C in the last 72 hours. Lipid Profile No results for input(s): CHOL, HDL, LDLCALC, TRIG, CHOLHDL, LDLDIRECT in the last 72 hours. Thyroid function studies No results for input(s): TSH, T4TOTAL, T3FREE, THYROIDAB in the last 72 hours.  Invalid input(s): FREET3 Anemia work up No results for input(s): VITAMINB12, FOLATE, FERRITIN, TIBC, IRON, RETICCTPCT in the last 72 hours. Urinalysis    Component  Value Date/Time   COLORURINE YELLOW 05/22/2016 2028   APPEARANCEUR CLEAR 05/22/2016 2028   LABSPEC 1.015 05/22/2016 2028   PHURINE 6.0 05/22/2016 2028   GLUCOSEU NEGATIVE 05/22/2016 2028   HGBUR LARGE (A) 05/22/2016 2028   BILIRUBINUR NEGATIVE 05/22/2016 2028   KETONESUR NEGATIVE 05/22/2016 2028   PROTEINUR 100 (A) 05/22/2016 2028   UROBILINOGEN 0.2 05/22/2016 1926   NITRITE NEGATIVE 05/22/2016 2028   LEUKOCYTESUR NEGATIVE 05/22/2016 2028   Sepsis Labs Invalid input(s): PROCALCITONIN,  WBC,  LACTICIDVEN Microbiology Recent Results (from the past 240 hour(s))  Urine culture     Status: None   Collection Time: 05/22/16  8:28 PM  Result Value Ref Range Status   Specimen Description URINE, CLEAN CATCH  Final   Special Requests NONE  Final   Culture NO GROWTH  Final   Report Status 05/24/2016 FINAL  Final     Time coordinating discharge: Over  30 minutes  SIGNED:   Louellen Molder, MD  [facility] 05/25/2016, 4:21 PM Pager   If 7PM-7AM, please contact night-coverage [email or web address] Password [email or web address]

## 2016-06-19 ENCOUNTER — Encounter: Payer: Self-pay | Admitting: Family Medicine

## 2016-06-19 ENCOUNTER — Ambulatory Visit (INDEPENDENT_AMBULATORY_CARE_PROVIDER_SITE_OTHER): Payer: Self-pay | Admitting: Family Medicine

## 2016-06-19 VITALS — BP 142/82 | HR 63 | Temp 97.9°F | Resp 16 | Ht 71.0 in | Wt 253.0 lb

## 2016-06-19 DIAGNOSIS — I10 Essential (primary) hypertension: Secondary | ICD-10-CM

## 2016-06-19 DIAGNOSIS — M109 Gout, unspecified: Secondary | ICD-10-CM

## 2016-06-19 DIAGNOSIS — R3129 Other microscopic hematuria: Secondary | ICD-10-CM

## 2016-06-19 DIAGNOSIS — N183 Chronic kidney disease, stage 3 unspecified: Secondary | ICD-10-CM

## 2016-06-19 LAB — COMPLETE METABOLIC PANEL WITH GFR
ALT: 19 U/L (ref 9–46)
AST: 15 U/L (ref 10–40)
Albumin: 4 g/dL (ref 3.6–5.1)
Alkaline Phosphatase: 52 U/L (ref 40–115)
BUN: 36 mg/dL — ABNORMAL HIGH (ref 7–25)
CO2: 20 mmol/L (ref 20–31)
Calcium: 9.3 mg/dL (ref 8.6–10.3)
Chloride: 109 mmol/L (ref 98–110)
Creat: 2.15 mg/dL — ABNORMAL HIGH (ref 0.60–1.35)
GFR, Est African American: 43 mL/min — ABNORMAL LOW (ref >=60)
GFR, Est Non African American: 37 mL/min — ABNORMAL LOW (ref >=60)
Glucose, Bld: 84 mg/dL (ref 65–99)
Potassium: 5 mmol/L (ref 3.5–5.3)
Sodium: 139 mmol/L (ref 135–146)
Total Bilirubin: 0.3 mg/dL (ref 0.2–1.2)
Total Protein: 7.5 g/dL (ref 6.1–8.1)

## 2016-06-19 LAB — CBC WITH DIFFERENTIAL/PLATELET
Basophils Absolute: 79 cells/uL (ref 0–200)
Basophils Relative: 1 %
Eosinophils Absolute: 869 cells/uL — ABNORMAL HIGH (ref 15–500)
Eosinophils Relative: 11 %
HCT: 37.6 % — ABNORMAL LOW (ref 38.5–50.0)
Hemoglobin: 12.3 g/dL — ABNORMAL LOW (ref 13.2–17.1)
Lymphocytes Relative: 35 %
Lymphs Abs: 2765 cells/uL (ref 850–3900)
MCH: 26.4 pg — ABNORMAL LOW (ref 27.0–33.0)
MCHC: 32.7 g/dL (ref 32.0–36.0)
MCV: 80.7 fL (ref 80.0–100.0)
MPV: 9.3 fL (ref 7.5–12.5)
Monocytes Absolute: 553 cells/uL (ref 200–950)
Monocytes Relative: 7 %
Neutro Abs: 3634 cells/uL (ref 1500–7800)
Neutrophils Relative %: 46 %
Platelets: 302 10*3/uL (ref 140–400)
RBC: 4.66 MIL/uL (ref 4.20–5.80)
RDW: 15.2 % — ABNORMAL HIGH (ref 11.0–15.0)
WBC: 7.9 10*3/uL (ref 3.8–10.8)

## 2016-06-19 LAB — POCT URINALYSIS DIP (DEVICE)
Bilirubin Urine: NEGATIVE
Glucose, UA: NEGATIVE mg/dL
Ketones, ur: NEGATIVE mg/dL
Nitrite: NEGATIVE
Protein, ur: 100 mg/dL — AB
Specific Gravity, Urine: 1.015 (ref 1.005–1.030)
Urobilinogen, UA: 0.2 mg/dL (ref 0.0–1.0)
pH: 5.5 (ref 5.0–8.0)

## 2016-06-19 LAB — POCT GLYCOSYLATED HEMOGLOBIN (HGB A1C): Hemoglobin A1C: 5.7

## 2016-06-19 MED ORDER — HYDRALAZINE HCL 50 MG PO TABS: 50.0000 mg | ORAL_TABLET | Freq: Three times a day (TID) | ORAL | 0 refills | Status: DC

## 2016-06-19 MED ORDER — ATENOLOL 25 MG PO TABS: 12.5000 mg | ORAL_TABLET | Freq: Every day | ORAL | 1 refills | Status: DC

## 2016-06-19 NOTE — Patient Instructions (Signed)
Sent referral to nephrology (they will call you to scheduled an appointment) Sent urine culture and CMP (Will call you with an ABNORMAL results)  Obesity:  Recommend a lowfat, low carbohydrate diet divided over 5-6 small meals, increase water intake to 6-8 glasses, and 150 minutes per week of cardiovascular exercise.    Hypertension:  Hydralazine take every 8 hours for hypertension Atenolol take medication daily Return in 1 week for BP check. 1 month for hematuria and hypertension

## 2016-06-19 NOTE — Progress Notes (Signed)
Subjective:    Patient ID: Michael [name], male    DOB: 11/06/1975, 41 y.o.   MRN: [id number]  HPI Michael [name], a 41 year old male presents accompanied by fiance to establish care. Patient has not been followed by a primary provider in greater than 3 years due to financial constraints.  Patient has a history of polycystic kidney disease and was admitted to inpatient services on 05/23/2016. He presented to the emergency department complaining of blood in urine and a fever of 101.9.  Blood work showed Leukocytosis. [place] was admitted and treated with IV antibiotics. He was also started on antihypertensives, while hospitalized. He has started an exercises regimen and started a lowfat, low sodium diet with the help of his fiance. He check blood pressures at home with a wrist cuff. He maintains that blood pressures have been fluctuating. Patient denies chest pain, dyspnea, fatigue, irregular heart beat, orthopnea, palpitations, syncope and tachypnea.  Cardiovascular risk factors include obesity (BMI >= 30 kg/m2).   Past Medical History:  Diagnosis Date  . Hypertension   . Polycystic kidney disease    Social History   Social History  . Marital status: Single    Spouse name: N/A  . Number of children: N/A  . Years of education: N/A   Occupational History  . Not on file.   Social History Main Topics  . Smoking status: Former Research scientist (life sciences)  . Smokeless tobacco: Never Used  . Alcohol use Yes     Comment: wine occ   . Drug use: No  . Sexual activity: Not on file   Other Topics Concern  . Not on file   Social History Narrative  . No narrative on file  There is no immunization history for the selected administration types on file for this patient. Review of Systems  Constitutional: Negative.   HENT: Negative.   Eyes: Negative.  Negative for photophobia, redness and visual disturbance.  Respiratory: Negative.   Cardiovascular: Negative.  Negative for chest pain, palpitations and leg swelling.   Gastrointestinal: Negative.   Endocrine: Negative for polydipsia, polyphagia and polyuria.  Genitourinary: Negative.   Musculoskeletal: Negative.  Negative for back pain, gait problem, joint swelling and myalgias.  Neurological: Negative.   Hematological: Negative.   Psychiatric/Behavioral: Negative.  The patient is not hyperactive.        Objective:   Physical Exam  Constitutional: He appears well-developed and well-nourished.  HENT:  Head: Normocephalic and atraumatic.  Right Ear: External ear normal.  Left Ear: External ear normal.  Nose: Nose normal.  Mouth/Throat: Oropharynx is clear and moist.  Eyes: Conjunctivae are normal. Pupils are equal, round, and reactive to light.  Neck: Normal range of motion.  Cardiovascular: Regular rhythm, normal heart sounds and normal pulses.  Bradycardia present.   Pulmonary/Chest: Effort normal.  Abdominal: Soft. Bowel sounds are normal.      BP (!) 142/82 (BP Location: Left Arm, Patient Position: Sitting, Cuff Size: Large) Comment: MANUAL  Pulse 63   Temp 97.9 F (36.6 C) (Oral)   Resp 16   Ht 5\' 11"  (1.803 m)   Wt 253 lb (114.8 kg)   SpO2 99%   BMI 35.29 kg/m  Assessment & Plan:  1. CKD (chronic kidney disease) stage 3, GFR 30-59 ml/min - Ambulatory referral to Nephrology - COMPLETE METABOLIC PANEL WITH GFR - Microalbumin/Creatinine Ratio, Urine  2. Other microscopic hematuria - Ambulatory referral to Nephrology - COMPLETE METABOLIC PANEL WITH GFR - Urine culture - CBC with Differential  3.  Essential hypertension Bradycardia, pulse is 48 manually. Decreased Atenolol to 12.5 mg daily. Patient to return in 1 week for BP check.  - Microalbumin/Creatinine Ratio, Urine - EKG 12-Lead - atenolol (TENORMIN) 25 MG tablet; Take 0.5 tablets (12.5 mg total) by mouth daily.  Dispense: 30 tablet; Refill: 1 - hydrALAZINE (APRESOLINE) 50 MG tablet; Take 1 tablet (50 mg total) by mouth every 8 (eight) hours.  Dispense: 90 tablet; Refill:  0  4. Morbid obesity ([place]) Recommend a lowfat, low carbohydrate diet divided over 5-6 small meals, increase water intake to 6-8 glasses, and 150 minutes per week of cardiovascular exercise.   - HgB A1c  5. Acute gouty arthritis - Uric Acid   RTC: 1 week for BP check and 1 month for hypertension and hematuria   [name]  MSN, FNP-C [facility] [place], [place] [address] [phone]   The patient was given clear instructions to go to ER or return to medical center if symptoms do not improve, worsen or new problems develop. The patient verbalized understanding. Will notify patient with laboratory results.

## 2016-06-20 LAB — MICROALBUMIN / CREATININE URINE RATIO
Creatinine, Urine: 141 mg/dL (ref 20–370)
Microalb Creat Ratio: 171 mcg/mg creat — ABNORMAL HIGH (ref <30)
Microalb, Ur: 24.1 mg/dL

## 2016-06-20 LAB — URIC ACID: Uric Acid, Serum: 8.8 mg/dL — ABNORMAL HIGH (ref 4.0–8.0)

## 2016-06-21 LAB — URINE CULTURE: Organism ID, Bacteria: NO GROWTH

## 2016-06-25 ENCOUNTER — Ambulatory Visit: Payer: Self-pay

## 2016-06-25 VITALS — BP 122/68 | HR 68

## 2016-06-25 DIAGNOSIS — I10 Essential (primary) hypertension: Secondary | ICD-10-CM

## 2016-07-19 ENCOUNTER — Encounter: Payer: Self-pay | Admitting: Family Medicine

## 2016-07-19 ENCOUNTER — Ambulatory Visit (INDEPENDENT_AMBULATORY_CARE_PROVIDER_SITE_OTHER): Payer: Self-pay | Admitting: Family Medicine

## 2016-07-19 VITALS — BP 150/86 | HR 98 | Temp 98.8°F | Resp 16 | Ht 71.0 in | Wt 259.0 lb

## 2016-07-19 DIAGNOSIS — Z23 Encounter for immunization: Secondary | ICD-10-CM

## 2016-07-19 DIAGNOSIS — N183 Chronic kidney disease, stage 3 unspecified: Secondary | ICD-10-CM

## 2016-07-19 DIAGNOSIS — Q612 Polycystic kidney, adult type: Secondary | ICD-10-CM

## 2016-07-19 DIAGNOSIS — I10 Essential (primary) hypertension: Secondary | ICD-10-CM

## 2016-07-19 LAB — POCT URINALYSIS DIP (DEVICE)
Bilirubin Urine: NEGATIVE
Glucose, UA: NEGATIVE mg/dL
Ketones, ur: NEGATIVE mg/dL
Leukocytes, UA: NEGATIVE
Nitrite: NEGATIVE
Protein, ur: 30 mg/dL — AB
Specific Gravity, Urine: 1.015 (ref 1.005–1.030)
Urobilinogen, UA: 0.2 mg/dL (ref 0.0–1.0)
pH: 6 (ref 5.0–8.0)

## 2016-07-19 LAB — BASIC METABOLIC PANEL
BUN: 32 mg/dL — ABNORMAL HIGH (ref 7–25)
CO2: 19 mmol/L — ABNORMAL LOW (ref 20–31)
Calcium: 8.5 mg/dL — ABNORMAL LOW (ref 8.6–10.3)
Chloride: 109 mmol/L (ref 98–110)
Creat: 2.26 mg/dL — ABNORMAL HIGH (ref 0.60–1.35)
Glucose, Bld: 116 mg/dL — ABNORMAL HIGH (ref 65–99)
Potassium: 4.6 mmol/L (ref 3.5–5.3)
Sodium: 140 mmol/L (ref 135–146)

## 2016-07-19 MED ORDER — ATENOLOL 25 MG PO TABS: 12.5000 mg | ORAL_TABLET | Freq: Every day | ORAL | 1 refills | Status: DC

## 2016-07-19 MED ORDER — HYDRALAZINE HCL 50 MG PO TABS: 50.0000 mg | ORAL_TABLET | Freq: Three times a day (TID) | ORAL | 1 refills | Status: DC

## 2016-07-19 MED FILL — ATENOLOL 25 MG TABLET: 25 | 30 days supply | Qty: 15 | Fill #0

## 2016-07-19 MED FILL — hydrALAZINE HCL 50 MG TABS: 50 | 30 days supply | Qty: 90 | Fill #0

## 2016-07-19 NOTE — Patient Instructions (Addendum)

## 2016-07-19 NOTE — Progress Notes (Signed)
Subjective:    Patient ID: Michael [name], male    DOB: Aug 31, 1975, 41 y.o.   MRN: [id number]  Michael [name], a 41 year old male with a history of hypertension, gout,  and stage 3 CKD present for a follow up. He says that he has been taking medications consistently and following a low fat, low sodium diet. He started an exercise regimen as well.    Hypertension  This is a chronic problem. The current episode started more than 1 month ago. Progression since onset: [name] states that he has been out of medications for 2 days. The problem is uncontrolled. Pertinent negatives include no anxiety, blurred vision, chest pain, headaches, malaise/fatigue, neck pain, orthopnea, palpitations, peripheral edema, PND, shortness of breath or sweats. Risk factors for coronary artery disease include obesity, male gender and dyslipidemia. The current treatment provides mild improvement. There are no compliance problems.  There is no history of angina, kidney disease, CAD/MI, CVA, heart failure, left ventricular hypertrophy, PVD or retinopathy. Identifiable causes of hypertension include chronic renal disease.    Past Medical History:  Diagnosis Date  . Hypertension   . Polycystic kidney disease    Social History   Social History  . Marital status: Single    Spouse name: N/A  . Number of children: N/A  . Years of education: N/A   Occupational History  . Not on file.   Social History Main Topics  . Smoking status: Former Research scientist (life sciences)  . Smokeless tobacco: Never Used  . Alcohol use Yes     Comment: wine occ   . Drug use: No  . Sexual activity: Not on file   Other Topics Concern  . Not on file   Social History Narrative  . No narrative on file  There is no immunization history for the selected administration types on file for this patient. Review of Systems  Constitutional: Negative.  Negative for malaise/fatigue.  HENT: Negative.   Eyes: Negative.  Negative for blurred vision, photophobia,  redness and visual disturbance.  Respiratory: Negative.  Negative for shortness of breath.   Cardiovascular: Negative.  Negative for chest pain, palpitations, orthopnea, leg swelling and PND.  Gastrointestinal: Negative.   Endocrine: Negative for polydipsia, polyphagia and polyuria.  Genitourinary: Negative.   Musculoskeletal: Negative.  Negative for back pain, gait problem, joint swelling, myalgias and neck pain.  Neurological: Negative.  Negative for headaches.  Hematological: Negative.   Psychiatric/Behavioral: Negative.  The patient is not hyperactive.        Objective:   Physical Exam  Constitutional: He is oriented to person, place, and time. He appears well-developed and well-nourished.  HENT:  Head: Normocephalic and atraumatic.  Right Ear: External ear normal.  Left Ear: External ear normal.  Nose: Nose normal.  Mouth/Throat: Oropharynx is clear and moist.  Eyes: Conjunctivae are normal. Pupils are equal, round, and reactive to light.  Neck: Normal range of motion.  Cardiovascular: Normal rate, normal heart sounds and normal pulses.   Pulmonary/Chest: Effort normal.  Abdominal: Soft. Bowel sounds are normal.  Neurological: He is alert and oriented to person, place, and time.      BP (!) 150/86 (BP Location: Left Arm, Patient Position: Sitting, Cuff Size: Large) Comment: manual  Pulse 98   Temp 98.8 F (37.1 C) (Oral)   Resp 16   Ht 5\' 11"  (1.803 m)   Wt 259 lb (117.5 kg)   SpO2 98%   BMI 36.12 kg/m  Assessment & Plan:  1. Essential  hypertension Blood pressure is above goal. Mr. Novosel has been out of medication for 2 days. Will follow up in 1 month for a blood pressure check. - Basic Metabolic Panel - Uric Acid - POCT urinalysis dip (device) - hydrALAZINE (APRESOLINE) 50 MG tablet; Take 1 tablet (50 mg total) by mouth every 8 (eight) hours.  Dispense: 90 tablet; Refill: 1 - atenolol (TENORMIN) 25 MG tablet; Take 0.5 tablets (12.5 mg total) by mouth daily.   Dispense: 90 tablet; Refill: 1  2. CKD (chronic kidney disease) stage 3, GFR 30-59 ml/min  - Basic Metabolic Panel  3. Adult polycystic kidney disease - Basic Metabolic Panel - Uric Acid  RTC: 1 month for hypertension   [name]  MSN, FNP-C [facility] [address] [place], [place] [address] [phone]

## 2016-07-20 ENCOUNTER — Other Ambulatory Visit: Payer: Self-pay | Admitting: Family Medicine

## 2016-07-20 DIAGNOSIS — E790 Hyperuricemia without signs of inflammatory arthritis and tophaceous disease: Secondary | ICD-10-CM

## 2016-07-20 DIAGNOSIS — M1A30X0 Chronic gout due to renal impairment, unspecified site, without tophus (tophi): Secondary | ICD-10-CM

## 2016-07-20 LAB — URIC ACID: Uric Acid, Serum: 9.6 mg/dL — ABNORMAL HIGH (ref 4.0–8.0)

## 2016-07-20 MED ORDER — ALLOPURINOL 100 MG PO TABS: 100.0000 mg | ORAL_TABLET | Freq: Two times a day (BID) | ORAL | 5 refills | Status: DC

## 2016-07-20 MED FILL — ALLOPURINOL 100 MG TABLET: 100 | 30 days supply | Qty: 60 | Fill #0

## 2016-07-20 NOTE — Progress Notes (Signed)
Meds ordered this encounter  Medications  . allopurinol (ZYLOPRIM) 100 MG tablet    Sig: Take 1 tablet (100 mg total) by mouth 2 (two) times daily.    Dispense:  60 tablet    Refill:  [place]  MSN, FNP-C [facility] [address] [place], [place] [address] [phone]

## 2016-07-20 NOTE — Progress Notes (Signed)
Called and spoke with patient, advised of elevated uric acid levels and the need to start on allopurinol 100mg  twice daily. I advised to follow a low purine diet. Patient verbalized understanding. Thanks!

## 2016-08-20 ENCOUNTER — Ambulatory Visit: Payer: Self-pay | Admitting: Family Medicine

## 2016-08-30 ENCOUNTER — Encounter: Payer: Self-pay | Admitting: Family Medicine

## 2016-08-30 ENCOUNTER — Ambulatory Visit (INDEPENDENT_AMBULATORY_CARE_PROVIDER_SITE_OTHER): Payer: Self-pay | Admitting: Family Medicine

## 2016-08-30 VITALS — BP 150/88 | HR 69 | Temp 98.1°F | Resp 16 | Ht 71.0 in | Wt 254.0 lb

## 2016-08-30 DIAGNOSIS — G4719 Other hypersomnia: Secondary | ICD-10-CM

## 2016-08-30 DIAGNOSIS — I10 Essential (primary) hypertension: Secondary | ICD-10-CM

## 2016-08-30 DIAGNOSIS — R0683 Snoring: Secondary | ICD-10-CM

## 2016-08-30 DIAGNOSIS — G4730 Sleep apnea, unspecified: Secondary | ICD-10-CM

## 2016-08-30 LAB — POCT URINALYSIS DIP (DEVICE)
Bilirubin Urine: NEGATIVE
Glucose, UA: NEGATIVE mg/dL
Hgb urine dipstick: NEGATIVE
Ketones, ur: NEGATIVE mg/dL
Leukocytes, UA: NEGATIVE
Nitrite: NEGATIVE
Protein, ur: 30 mg/dL — AB
Specific Gravity, Urine: 1.015 (ref 1.005–1.030)
Urobilinogen, UA: 0.2 mg/dL (ref 0.0–1.0)
pH: 6 (ref 5.0–8.0)

## 2016-08-30 NOTE — Patient Instructions (Addendum)
Hypertension:  Will continue Hydralazine 50 mg 3 times per day.  Will continue Atenolol 12.5 mg twice daily.  Continue DASH diet and exercise.  Return in 1 week for bp check, if bp continues to be elevated will increase Atenolol.    Sleep apnea:   Will order a split night sleep study at [facility]

## 2016-08-30 NOTE — Progress Notes (Signed)
Subjective:    Patient ID: Michael [name], male    DOB: 05-Feb-1976, 41 y.o.   MRN: [id number]  Michael [name], a 41 year old male with a history of hypertension, gout,  and stage 3 CKD present for a follow up. He says that he has been taking medications consistently and following a low fat, low sodium diet. He started an exercise regimen as well. He check blood pressures at home and says that they have been fluctuating. He has started check blood pressures at [facility] and all have been within a normal range.  Michael [name] is also complaining of possible obstructive sleep apnea. Patent has symptoms of snoring and daytime sleepiness. He generally gets 5 hours of uninterupted sleep. Snoring is of moderate severity. He also says that his fiance has witness apneic episodes.    Hypertension  This is a chronic problem. The current episode started more than 1 month ago. The problem is uncontrolled. Pertinent negatives include no anxiety, blurred vision, chest pain, headaches, malaise/fatigue, neck pain, orthopnea, palpitations, peripheral edema, PND, shortness of breath or sweats. Risk factors for coronary artery disease include obesity, male gender and dyslipidemia. The current treatment provides mild improvement. There are no compliance problems.  There is no history of angina, kidney disease, CAD/MI, CVA, heart failure, left ventricular hypertrophy, PVD or retinopathy. Identifiable causes of hypertension include chronic renal disease.    Past Medical History:  Diagnosis Date  . Hypertension   . Polycystic kidney disease    Social History   Social History  . Marital status: Single    Spouse name: N/A  . Number of children: N/A  . Years of education: N/A   Occupational History  . Not on file.   Social History Main Topics  . Smoking status: Former Research scientist (life sciences)  . Smokeless tobacco: Never Used  . Alcohol use Yes     Comment: wine occ   . Drug use: No  . Sexual activity: Not on file   Other Topics Concern   . Not on file   Social History Narrative  . No narrative on file  There is no immunization history for the selected administration types on file for this patient. Review of Systems  Constitutional: Negative.  Negative for malaise/fatigue.  HENT: Negative.   Eyes: Negative.  Negative for blurred vision, photophobia, redness and visual disturbance.  Respiratory: Positive for apnea. Negative for shortness of breath.   Cardiovascular: Negative.  Negative for chest pain, palpitations, orthopnea, leg swelling and PND.  Gastrointestinal: Negative.   Endocrine: Negative for polydipsia, polyphagia and polyuria.  Genitourinary: Negative.   Musculoskeletal: Negative.  Negative for back pain, gait problem, joint swelling, myalgias and neck pain.  Skin: Negative.   Neurological: Negative.  Negative for headaches.  Hematological: Negative.   Psychiatric/Behavioral: Negative.  The patient is not hyperactive.        Objective:   Physical Exam  Constitutional: He is oriented to person, place, and time. He appears well-developed and well-nourished.  HENT:  Head: Normocephalic and atraumatic.  Right Ear: External ear normal.  Left Ear: External ear normal.  Nose: Nose normal.  Mouth/Throat: Oropharynx is clear and moist.  Eyes: Conjunctivae are normal. Pupils are equal, round, and reactive to light.  Neck: Normal range of motion.  Cardiovascular: Normal rate, normal heart sounds and normal pulses.   Pulmonary/Chest: Effort normal.  Abdominal: Soft. Bowel sounds are normal.  Neurological: He is alert and oriented to person, place, and time.      BP Marland Kitchen)  150/88 (BP Location: Left Arm, Patient Position: Sitting, Cuff Size: Large) Comment: manual  Pulse 69   Temp 98.1 F (36.7 C) (Oral)   Resp 16   Ht 5\' 11"  (1.803 m)   Wt 254 lb (115.2 kg)   SpO2 98%   BMI 35.43 kg/m  Assessment & Plan:  1. Essential hypertension Blood pressure continues to be above goal. Patient exercised prior to  arrival. He will return in 1 week for a blood pressure check. I will most likely increase anti-hypertensive medications at that time. Reviewed urinalysis, proteinuria has improved.   2. Primary snoring Mr. Nasworthy warrants a sleep study for further evaluation of snoring, apneic episodes, and daytime sleepiness.  - Split night study; Future  3. Observed sleep apnea - Split night study; Future  4. Excessive daytime sleepiness - Split night study; Future  RTC: 1 week for bp check and 1 month for hypertension   [name]  MSN, FNP-C [facility] [address] [place], [place] [address] [phone]

## 2016-09-01 DIAGNOSIS — R0683 Snoring: Secondary | ICD-10-CM | POA: Insufficient documentation

## 2016-09-01 DIAGNOSIS — G4719 Other hypersomnia: Secondary | ICD-10-CM | POA: Insufficient documentation

## 2016-09-01 DIAGNOSIS — G4730 Sleep apnea, unspecified: Secondary | ICD-10-CM | POA: Insufficient documentation

## 2016-09-06 ENCOUNTER — Ambulatory Visit: Payer: Self-pay

## 2016-09-06 ENCOUNTER — Other Ambulatory Visit: Payer: Self-pay | Admitting: Family Medicine

## 2016-09-06 VITALS — BP 146/82

## 2016-09-06 DIAGNOSIS — I10 Essential (primary) hypertension: Secondary | ICD-10-CM

## 2016-09-06 MED ORDER — ATENOLOL 25 MG PO TABS: 25.0000 mg | ORAL_TABLET | Freq: Every day | ORAL | 1 refills | Status: DC

## 2016-09-06 MED FILL — ATENOLOL 25 MG TABLET: 25 | 30 days supply | Qty: 15 | Fill #1

## 2016-09-06 MED FILL — hydrALAZINE HCL 50 MG TABS: 50 | 30 days supply | Qty: 90 | Fill #1

## 2016-09-27 ENCOUNTER — Other Ambulatory Visit: Payer: Self-pay | Admitting: Family Medicine

## 2016-09-27 DIAGNOSIS — I10 Essential (primary) hypertension: Secondary | ICD-10-CM

## 2016-09-27 MED ORDER — ATENOLOL 25 MG PO TABS: 25.0000 mg | ORAL_TABLET | Freq: Every day | ORAL | 1 refills | Status: DC

## 2016-09-27 MED FILL — ATENOLOL 25 MG TABLET: 25 | 90 days supply | Qty: 90 | Fill #0

## 2016-09-27 NOTE — Progress Notes (Signed)
Patient requested Atenolol medication to be sent to [facility].

## 2016-10-08 ENCOUNTER — Ambulatory Visit: Payer: Self-pay | Admitting: Family Medicine

## 2016-10-09 ENCOUNTER — Ambulatory Visit (INDEPENDENT_AMBULATORY_CARE_PROVIDER_SITE_OTHER): Payer: Self-pay | Admitting: Family Medicine

## 2016-10-09 ENCOUNTER — Encounter: Payer: Self-pay | Admitting: Family Medicine

## 2016-10-09 VITALS — BP 152/100 | HR 66 | Temp 98.1°F | Resp 16 | Ht 71.0 in | Wt 254.0 lb

## 2016-10-09 DIAGNOSIS — F411 Generalized anxiety disorder: Secondary | ICD-10-CM

## 2016-10-09 DIAGNOSIS — I10 Essential (primary) hypertension: Secondary | ICD-10-CM

## 2016-10-09 DIAGNOSIS — M109 Gout, unspecified: Secondary | ICD-10-CM

## 2016-10-09 LAB — POCT URINALYSIS DIP (DEVICE)
Bilirubin Urine: NEGATIVE
Glucose, UA: NEGATIVE mg/dL
Ketones, ur: NEGATIVE mg/dL
Leukocytes, UA: NEGATIVE
Nitrite: NEGATIVE
Protein, ur: 30 mg/dL — AB
Specific Gravity, Urine: 1.015 (ref 1.005–1.030)
Urobilinogen, UA: 0.2 mg/dL (ref 0.0–1.0)
pH: 5.5 (ref 5.0–8.0)

## 2016-10-09 LAB — BASIC METABOLIC PANEL
BUN: 33 mg/dL — ABNORMAL HIGH (ref 7–25)
CO2: 18 mmol/L — ABNORMAL LOW (ref 20–31)
Calcium: 9.1 mg/dL (ref 8.6–10.3)
Chloride: 106 mmol/L (ref 98–110)
Creat: 2.34 mg/dL — ABNORMAL HIGH (ref 0.60–1.35)
Glucose, Bld: 84 mg/dL (ref 65–99)
Potassium: 4.6 mmol/L (ref 3.5–5.3)
Sodium: 138 mmol/L (ref 135–146)

## 2016-10-09 MED ORDER — BUSPIRONE HCL 5 MG PO TABS: 5.0000 mg | ORAL_TABLET | Freq: Two times a day (BID) | ORAL | 5 refills | Status: DC

## 2016-10-09 MED ORDER — LOSARTAN POTASSIUM 25 MG PO TABS: 50.0000 mg | ORAL_TABLET | Freq: Every day | ORAL | 2 refills | Status: DC

## 2016-10-09 MED ORDER — ATENOLOL 25 MG PO TABS: 25.0000 mg | ORAL_TABLET | Freq: Every day | ORAL | 1 refills | Status: DC

## 2016-10-09 MED FILL — busPIRone HCL 5 MG TABS: 5 | 30 days supply | Qty: 60 | Fill #0

## 2016-10-09 MED FILL — LOSARTAN POTASSIUM 25 MG TA: 25 | 30 days supply | Qty: 60 | Fill #0

## 2016-10-09 NOTE — Progress Notes (Addendum)
Subjective:    Patient ID: Michael [name], male    DOB: [birth year], 41 y.o.   MRN: [id number]  Michael [name], a 41 year old male with a history of hypertension, gout,  and stage 3 CKD present for a follow up. He says that he has been taking medications consistently and following a low fat, low sodium diet. He started an exercise regimen as well. He check blood pressures at home and it continues to fluctuate.     Hypertension  This is a chronic problem. The current episode started more than 1 month ago. The problem is uncontrolled. Associated symptoms include anxiety. Pertinent negatives include no blurred vision, chest pain, headaches, malaise/fatigue, neck pain, orthopnea, palpitations, peripheral edema, PND, shortness of breath or sweats. Risk factors for coronary artery disease include obesity, male gender and dyslipidemia. The current treatment provides mild improvement. There are no compliance problems.  There is no history of angina, kidney disease, CAD/MI, CVA, heart failure, left ventricular hypertrophy, PVD or retinopathy. Identifiable causes of hypertension include chronic renal disease.  Anxiety  Presents for initial visit. Onset was 1 to 4 weeks ago. The problem has been gradually worsening. Symptoms include excessive worry and nervous/anxious behavior. Patient reports no chest pain, palpitations or shortness of breath. The symptoms are aggravated by family issues. The quality of sleep is fair. Nighttime awakenings: none.   Risk factors include a major life event (Fiance's 40 year old son recently came to live in his home. ).    Past Medical History:  Diagnosis Date  . Hypertension   . Polycystic kidney disease    Social History   Social History  . Marital status: Single    Spouse name: N/A  . Number of children: N/A  . Years of education: N/A   Occupational History  . Not on file.   Social History Main Topics  . Smoking status: Former Research scientist (life sciences)  . Smokeless tobacco: Never  Used  . Alcohol use Yes     Comment: wine occ   . Drug use: No  . Sexual activity: Not on file   Other Topics Concern  . Not on file   Social History Narrative  . No narrative on file  There is no immunization history for the selected administration types on file for this patient. Review of Systems  Constitutional: Negative.  Negative for malaise/fatigue.  HENT: Negative.   Eyes: Negative.  Negative for blurred vision, photophobia, redness and visual disturbance.  Respiratory: Positive for apnea. Negative for shortness of breath.   Cardiovascular: Negative.  Negative for chest pain, palpitations, orthopnea, leg swelling and PND.  Gastrointestinal: Negative.   Endocrine: Negative for polydipsia, polyphagia and polyuria.  Genitourinary: Negative.   Musculoskeletal: Negative.  Negative for back pain, gait problem, joint swelling, myalgias and neck pain.  Skin: Negative.   Neurological: Negative.  Negative for headaches.  Hematological: Negative.   Psychiatric/Behavioral: The patient is nervous/anxious. The patient is not hyperactive.        Objective:   Physical Exam  Constitutional: He is oriented to person, place, and time. He appears well-developed and well-nourished.  HENT:  Head: Normocephalic and atraumatic.  Right Ear: External ear normal.  Left Ear: External ear normal.  Nose: Nose normal.  Mouth/Throat: Oropharynx is clear and moist.  Eyes: Pupils are equal, round, and reactive to light. Conjunctivae are normal.  Neck: Normal range of motion.  Cardiovascular: Normal rate, normal heart sounds and normal pulses.   Pulmonary/Chest: Effort normal.  Abdominal: Soft.  Bowel sounds are normal.  Neurological: He is alert and oriented to person, place, and time.      BP (!) 152/100 (BP Location: Left Arm, Patient Position: Sitting, Cuff Size: Normal) Comment: manually  Pulse 66   Temp 98.1 F (36.7 C) (Oral)   Resp 16   Ht 5\' 11"  (1.803 m)   Wt 254 lb (115.2 kg)    SpO2 99%   BMI 35.43 kg/m  Assessment & Plan:  1. Essential hypertension .Blood pressure is above goal on current medication regimen. Will add ARB to medication regimen for greater control. Reviewed urinalysis, proteinuria has improved.  - POCT urinalysis dip (device) - losartan (COZAAR) 25 MG tablet; Take 1 tablets (25 mg total) by mouth daily.  Dispense: 30 tablet; Refill: 2 - atenolol (TENORMIN) 25 MG tablet; Take 1 tablet (25 mg total) by mouth daily.  Dispense: 180 tablet; Refill: 1 - Basic Metabolic Panel  2. Generalized anxiety disorder Patient complains of increased anxiety. Discussed with patient at length. He has identified triggers. Advised to avoid triggers. Also, will start a trial of Buspar 5 mg twice daily  Recommend counseling at [facility].  GAD 7 : Generalized Anxiety Score 10/09/2016  Nervous, Anxious, on Edge 2  Control/stop worrying 2  Worry too much - different things 2  Trouble relaxing 1  Restless 1  Easily annoyed or irritable 1  Afraid - awful might happen 1  Total GAD 7 Score 10    - busPIRone (BUSPAR) 5 MG tablet; Take 1 tablet (5 mg total) by mouth 2 (two) times daily.  Dispense: 60 tablet; Refill: 5  3. Gout, unspecified cause, unspecified chronicity, unspecified site Patient has been taking Allopurinol 100 mg twice daily. Previous uric acid is 8.8, will repeat uric acid level to assess improvement.  - Uric Acid   RTC: 1 week for bp check and 1 month for hypertension   [name]  MSN, FNP-C [facility] [address] [place], [place] [address] [phone]

## 2016-10-09 NOTE — Patient Instructions (Addendum)
Blood pressure is above goal, will add Losartan 25 mg daiily  Generalized anxiety disordier: Will start Buspar 5 mg twice daily for  Anxiety Recommend [facility] in clinic M-F 8-3 pm   Losartan tablets What is this medicine? LOSARTAN (loe SAR tan) is used to treat high blood pressure and to reduce the risk of stroke in certain patients. This drug also slows the progression of kidney disease in patients with diabetes. This medicine may be used for other purposes; ask your health care provider or pharmacist if you have questions. COMMON BRAND NAME(S): Cozaar What should I tell my health care provider before I take this medicine? They need to know if you have any of these conditions: -heart failure -kidney or liver disease -an unusual or allergic reaction to losartan, other medicines, foods, dyes, or preservatives -pregnant or trying to get pregnant -breast-feeding How should I use this medicine? Take this medicine by mouth with a glass of water. Follow the directions on the prescription label. This medicine can be taken with or without food. Take your doses at regular intervals. Do not take your medicine more often than directed. Talk to your pediatrician regarding the use of this medicine in children. Special care may be needed. Overdosage: If you think you have taken too much of this medicine contact a poison control center or emergency room at once. NOTE: This medicine is only for you. Do not share this medicine with others. What if I miss a dose? If you miss a dose, take it as soon as you can. If it is almost time for your next dose, take only that dose. Do not take double or extra doses. What may interact with this medicine? -blood pressure medicines -diuretics, especially triamterene, spironolactone, or amiloride -fluconazole -NSAIDs, medicines for pain and inflammation, like ibuprofen or naproxen -potassium salts or potassium supplements -rifampin This list  may not describe all possible interactions. Give your health care provider a list of all the medicines, herbs, non-prescription drugs, or dietary supplements you use. Also tell them if you smoke, drink alcohol, or use illegal drugs. Some items may interact with your medicine. What should I watch for while using this medicine? Visit your doctor or health care professional for regular checks on your progress. Check your blood pressure as directed. Ask your doctor or health care professional what your blood pressure should be and when you should contact him or her. Call your doctor or health care professional if you notice an irregular or fast heart beat. Women should inform their doctor if they wish to become pregnant or think they might be pregnant. There is a potential for serious side effects to an unborn child, particularly in the second or third trimester. Talk to your health care professional or pharmacist for more information. You may get drowsy or dizzy. Do not drive, use machinery, or do anything that needs mental alertness until you know how this drug affects you. Do not stand or sit up quickly, especially if you are an older patient. This reduces the risk of dizzy or fainting spells. Alcohol can make you more drowsy and dizzy. Avoid alcoholic drinks. Avoid salt substitutes unless you are told otherwise by your doctor or health care professional. Do not treat yourself for coughs, colds, or pain while you are taking this medicine without asking your doctor or health care professional for advice. Some ingredients may increase your blood pressure. What side effects may I notice from receiving this medicine? Side effects that  you should report to your doctor or health care professional as soon as possible: -confusion, dizziness, light headedness or fainting spells -decreased amount of urine passed -difficulty breathing or swallowing, hoarseness, or tightening of the throat -fast or irregular heart  beat, palpitations, or chest pain -skin rash, itching -swelling of your face, lips, tongue, hands, or feet Side effects that usually do not require medical attention (report to your doctor or health care professional if they continue or are bothersome): -cough -decreased sexual function or desire -headache -nasal congestion or stuffiness -nausea or stomach pain -sore or cramping muscles This list may not describe all possible side effects. Call your doctor for medical advice about side effects. You may report side effects to FDA at 1-800-FDA-1088. Where should I keep my medicine? Keep out of the reach of children. Store at room temperature between 15 and 30 degrees C (59 and 86 degrees F). Protect from light. Keep container tightly closed. Throw away any unused medicine after the expiration date. NOTE: This sheet is a summary. It may not cover all possible information. If you have questions about this medicine, talk to your doctor, pharmacist, or health care provider.  2018 Elsevier/Gold Standard (2007-05-16 16:42:18) Buspirone tablets What is this medicine? BUSPIRONE (byoo SPYE rone) is used to treat anxiety disorders. This medicine may be used for other purposes; ask your health care provider or pharmacist if you have questions. COMMON BRAND NAME(S): BuSpar What should I tell my health care provider before I take this medicine? They need to know if you have any of these conditions: -kidney or liver disease -an unusual or allergic reaction to buspirone, other medicines, foods, dyes, or preservatives -pregnant or trying to get pregnant -breast-feeding How should I use this medicine? Take this medicine by mouth with a glass of water. Follow the directions on the prescription label. You may take this medicine with or without food. To ensure that this medicine always works the same way for you, you should take it either always with or always without food. Take your doses at regular  intervals. Do not take your medicine more often than directed. Do not stop taking except on the advice of your doctor or health care professional. Talk to your pediatrician regarding the use of this medicine in children. Special care may be needed. Overdosage: If you think you have taken too much of this medicine contact a poison control center or emergency room at once. NOTE: This medicine is only for you. Do not share this medicine with others. What if I miss a dose? If you miss a dose, take it as soon as you can. If it is almost time for your next dose, take only that dose. Do not take double or extra doses. What may interact with this medicine? Do not take this medicine with any of the following medications: -linezolid -MAOIs like Carbex, Eldepryl, Marplan, Nardil, and Parnate -methylene blue -procarbazine This medicine may also interact with the following medications: -diazepam -digoxin -diltiazem -erythromycin -grapefruit juice -haloperidol -medicines for mental depression or mood problems -medicines for seizures like carbamazepine, phenobarbital and phenytoin -nefazodone -other medications for anxiety -rifampin -ritonavir -some antifungal medicines like itraconazole, ketoconazole, and voriconazole -verapamil -warfarin This list may not describe all possible interactions. Give your health care provider a list of all the medicines, herbs, non-prescription drugs, or dietary supplements you use. Also tell them if you smoke, drink alcohol, or use illegal drugs. Some items may interact with your medicine. What should I watch for while using  this medicine? Visit your doctor or health care professional for regular checks on your progress. It may take 1 to 2 weeks before your anxiety gets better. You may get drowsy or dizzy. Do not drive, use machinery, or do anything that needs mental alertness until you know how this drug affects you. Do not stand or sit up quickly, especially if you  are an older patient. This reduces the risk of dizzy or fainting spells. Alcohol can make you more drowsy and dizzy. Avoid alcoholic drinks. What side effects may I notice from receiving this medicine? Side effects that you should report to your doctor or health care professional as soon as possible: -blurred vision or other vision changes -chest pain -confusion -difficulty breathing -feelings of hostility or anger -muscle aches and pains -numbness or tingling in hands or feet -ringing in the ears -skin rash and itching -vomiting -weakness Side effects that usually do not require medical attention (report to your doctor or health care professional if they continue or are bothersome): -disturbed dreams, nightmares -headache -nausea -restlessness or nervousness -sore throat and nasal congestion -stomach upset This list may not describe all possible side effects. Call your doctor for medical advice about side effects. You may report side effects to FDA at 1-800-FDA-1088. Where should I keep my medicine? Keep out of the reach of children. Store at room temperature below 30 degrees C (86 degrees F). Protect from light. Keep container tightly closed. Throw away any unused medicine after the expiration date. NOTE: This sheet is a summary. It may not cover all possible information. If you have questions about this medicine, talk to your doctor, pharmacist, or health care provider.  2018 Elsevier/Gold Standard (2009-10-13 18:06:11)

## 2016-10-10 LAB — URIC ACID: Uric Acid, Serum: 7.9 mg/dL (ref 4.0–8.0)

## 2016-10-11 ENCOUNTER — Telehealth: Payer: Self-pay

## 2016-10-11 MED ORDER — LOSARTAN POTASSIUM 25 MG PO TABS: 25.0000 mg | ORAL_TABLET | Freq: Every day | ORAL | 2 refills | Status: DC

## 2016-10-11 NOTE — Telephone Encounter (Signed)
Called, no answer. Left message for patient to call back. Thanks!  

## 2016-10-11 NOTE — Telephone Encounter (Signed)
-----   Message from [name], [place] sent at 10/11/2016  5:18 AM EDT ----- Regarding: lab results Please inform patient that creatinine level remains elevated. Will re-check at 1 month follow up to monitor improvement after adding Losartan to medication regimen. Remind to take medications consistently. Uric acid levels have improved on allopurinol, will continue at current dosage.   Thanks ----- Message ----- From: Interface, [facility] Sent: 10/10/2016  12:34 AM To: [name], FNP

## 2016-10-11 NOTE — Addendum Note (Signed)
Addended by: [name] on: 10/11/2016 01:56 PM   Modules accepted: Orders

## 2016-10-11 NOTE — Telephone Encounter (Signed)
Called patient and advised that creatine was still elevated and that we will continue to monitor and recheck in 1 month at follow up appointment after taking the losartan. Reminded to take medications every day consistently and advised to continue allopurinol at current dosage due to levels being improved. Patient verbalized understanding. Thanks!

## 2016-10-12 ENCOUNTER — Telehealth: Payer: Self-pay

## 2016-10-12 NOTE — Telephone Encounter (Signed)
Called and spoke with patient, he had a question about his blood pressure medications and how he should be taking them. I went over all meds and directions. Thanks!

## 2016-10-31 ENCOUNTER — Encounter: Payer: Self-pay | Admitting: Family Medicine

## 2016-10-31 ENCOUNTER — Ambulatory Visit (INDEPENDENT_AMBULATORY_CARE_PROVIDER_SITE_OTHER): Payer: Self-pay | Admitting: Family Medicine

## 2016-10-31 DIAGNOSIS — I10 Essential (primary) hypertension: Secondary | ICD-10-CM

## 2016-10-31 MED ORDER — ATENOLOL 25 MG PO TABS: 25.0000 mg | ORAL_TABLET | Freq: Two times a day (BID) | ORAL | 1 refills | Status: DC

## 2016-10-31 NOTE — Patient Instructions (Addendum)
Continue the following medication regimen:   Hydralazine 50 mg every 8 hours  Atenolol 25 mg twice daily Losartan 25 mg daily    DASH Eating Plan DASH stands for "Dietary Approaches to Stop Hypertension." The DASH eating plan is a healthy eating plan that has been shown to reduce high blood pressure (hypertension). It may also reduce your risk for type 2 diabetes, heart disease, and stroke. The DASH eating plan may also help with weight loss. What are tips for following this plan? General guidelines  Avoid eating more than 2,300 mg (milligrams) of salt (sodium) a day. If you have hypertension, you may need to reduce your sodium intake to 1,500 mg a day.  Limit alcohol intake to no more than 1 drink a day for nonpregnant women and 2 drinks a day for men. One drink equals 12 oz of beer, 5 oz of wine, or 1 oz of hard liquor.  Work with your health care provider to maintain a healthy body weight or to lose weight. Ask what an ideal weight is for you.  Get at least 30 minutes of exercise that causes your heart to beat faster (aerobic exercise) most days of the week. Activities may include walking, swimming, or biking.  Work with your health care provider or diet and nutrition specialist (dietitian) to adjust your eating plan to your individual calorie needs. Reading food labels  Check food labels for the amount of sodium per serving. Choose foods with less than 5 percent of the Daily Value of sodium. Generally, foods with less than 300 mg of sodium per serving fit into this eating plan.  To find whole grains, look for the word "whole" as the first word in the ingredient list. Shopping  Buy products labeled as "low-sodium" or "no salt added."  Buy fresh foods. Avoid canned foods and premade or frozen meals. Cooking  Avoid adding salt when cooking. Use salt-free seasonings or herbs instead of table salt or sea salt. Check with your health care provider or pharmacist before using salt  substitutes.  Do not fry foods. Cook foods using healthy methods such as baking, boiling, grilling, and broiling instead.  Cook with heart-healthy oils, such as olive, canola, soybean, or sunflower oil. Meal planning   Eat a balanced diet that includes: ? 5 or more servings of fruits and vegetables each day. At each meal, try to fill half of your plate with fruits and vegetables. ? Up to 6-8 servings of whole grains each day. ? Less than 6 oz of lean meat, poultry, or fish each day. A 3-oz serving of meat is about the same size as a deck of cards. One egg equals 1 oz. ? 2 servings of low-fat dairy each day. ? A serving of nuts, seeds, or beans 5 times each week. ? Heart-healthy fats. Healthy fats called Omega-3 fatty acids are found in foods such as flaxseeds and coldwater fish, like sardines, salmon, and mackerel.  Limit how much you eat of the following: ? Canned or prepackaged foods. ? Food that is high in trans fat, such as fried foods. ? Food that is high in saturated fat, such as fatty meat. ? Sweets, desserts, sugary drinks, and other foods with added sugar. ? Full-fat dairy products.  Do not salt foods before eating.  Try to eat at least 2 vegetarian meals each week.  Eat more home-cooked food and less restaurant, buffet, and fast food.  When eating at a restaurant, ask that your food be prepared with  less salt or no salt, if possible. What foods are recommended? The items listed may not be a complete list. Talk with your dietitian about what dietary choices are best for you. Grains Whole-grain or whole-wheat bread. Whole-grain or whole-wheat pasta. Brown rice. Michael [name]. Bulgur. Whole-grain and low-sodium cereals. Pita bread. Low-fat, low-sodium crackers. Whole-wheat flour tortillas. Vegetables Fresh or frozen vegetables (raw, steamed, roasted, or grilled). Low-sodium or reduced-sodium tomato and vegetable juice. Low-sodium or reduced-sodium tomato sauce and tomato  paste. Low-sodium or reduced-sodium canned vegetables. Fruits All fresh, dried, or frozen fruit. Canned fruit in natural juice (without added sugar). Meat and other protein foods Skinless chicken or [place]. Ground chicken or [place]. Pork with fat trimmed off. Fish and seafood. Egg whites. Dried beans, peas, or lentils. Unsalted nuts, nut butters, and seeds. Unsalted canned beans. Lean cuts of beef with fat trimmed off. Low-sodium, lean deli meat. Dairy Low-fat (1%) or fat-free (skim) milk. Fat-free, low-fat, or reduced-fat cheeses. Nonfat, low-sodium ricotta or cottage cheese. Low-fat or nonfat yogurt. Low-fat, low-sodium cheese. Fats and oils Soft margarine without trans fats. Vegetable oil. Low-fat, reduced-fat, or light mayonnaise and salad dressings (reduced-sodium). Canola, safflower, olive, soybean, and sunflower oils. Avocado. Seasoning and other foods Herbs. Spices. Seasoning mixes without salt. Unsalted popcorn and pretzels. Fat-free sweets. What foods are not recommended? The items listed may not be a complete list. Talk with your dietitian about what dietary choices are best for you. Grains Baked goods made with fat, such as croissants, muffins, or some breads. Dry pasta or rice meal packs. Vegetables Creamed or fried vegetables. Vegetables in a cheese sauce. Regular canned vegetables (not low-sodium or reduced-sodium). Regular canned tomato sauce and paste (not low-sodium or reduced-sodium). Regular tomato and vegetable juice (not low-sodium or reduced-sodium). Michael [name]. Olives. Fruits Canned fruit in a light or heavy syrup. Fried fruit. Fruit in cream or butter sauce. Meat and other protein foods Fatty cuts of meat. Ribs. Fried meat. Michael [name]. Sausage. Bologna and other processed lunch meats. Salami. Fatback. Hotdogs. Bratwurst. Salted nuts and seeds. Canned beans with added salt. Canned or smoked fish. Whole eggs or egg yolks. Chicken or [place] with skin. Dairy Whole or 2% milk,  cream, and half-and-half. Whole or full-fat cream cheese. Whole-fat or sweetened yogurt. Full-fat cheese. Nondairy creamers. Whipped toppings. Processed cheese and cheese spreads. Fats and oils Butter. Stick margarine. Lard. Shortening. Ghee. Bacon fat. Tropical oils, such as coconut, palm kernel, or palm oil. Seasoning and other foods Salted popcorn and pretzels. Onion salt, garlic salt, seasoned salt, table salt, and sea salt. Worcestershire sauce. Tartar sauce. Barbecue sauce. Teriyaki sauce. Soy sauce, including reduced-sodium. Steak sauce. Canned and packaged gravies. Fish sauce. Oyster sauce. Cocktail sauce. Horseradish that you find on the shelf. Ketchup. Mustard. Meat flavorings and tenderizers. Bouillon cubes. Hot sauce and Tabasco sauce. Premade or packaged marinades. Premade or packaged taco seasonings. Relishes. Regular salad dressings. Where to find more information:  National Heart, Lung, and [place]: https://wilson-eaton.com/  American Heart Association: www.heart.org Summary  The DASH eating plan is a healthy eating plan that has been shown to reduce high blood pressure (hypertension). It may also reduce your risk for type 2 diabetes, heart disease, and stroke.  With the DASH eating plan, you should limit salt (sodium) intake to 2,300 mg a day. If you have hypertension, you may need to reduce your sodium intake to 1,500 mg a day.  When on the DASH eating plan, aim to eat more fresh fruits and vegetables, whole grains, lean proteins,  low-fat dairy, and heart-healthy fats.  Work with your health care provider or diet and nutrition specialist (dietitian) to adjust your eating plan to your individual calorie needs. This information is not intended to replace advice given to you by your health care provider. Make sure you discuss any questions you have with your health care provider. Document Released: 02/22/2011 Document Revised: 02/27/2016 Document Reviewed: 02/27/2016 Elsevier  Interactive Patient Education  2017 [employer].

## 2016-11-02 NOTE — Progress Notes (Signed)
Subjective:    Patient ID: Michael [name], male    DOB: [birth year], 41 y.o.   MRN: [id number]  [name], a 41 year old male with a history of hypertension, gout,  and stage 3 CKD present for a follow up of hypertension and anxiety. He says that he has been taking medications consistently and following a low fat, low sodium diet. He started an exercise regimen as well. He check blood pressures at home. He says that blood pressure has improved since he has been taking medications correctly. He denies dizziness, chest pain/pressure, heart palpitations, fatigue, edema, or syncope.   He complained of increased anxiety 1 month ago. He described previous anxiety symptoms as excessive worrying and increased irritability. He says that symptoms have improved moderately since starting medication. He denies current suicidal or homicidal ideations.         Past Medical History:  Diagnosis Date  . Hypertension   . Polycystic kidney disease    Social History   Social History  . Marital status: Single    Spouse name: N/A  . Number of children: N/A  . Years of education: N/A   Occupational History  . Not on file.   Social History Main Topics  . Smoking status: Former Research scientist (life sciences)  . Smokeless tobacco: Never Used  . Alcohol use Yes     Comment: wine occ   . Drug use: No  . Sexual activity: Not on file   Other Topics Concern  . Not on file   Social History Narrative  . No narrative on file  There is no immunization history for the selected administration types on file for this patient. Review of Systems  Constitutional: Negative.  Negative for malaise/fatigue.  HENT: Negative.   Eyes: Negative.  Negative for blurred vision, photophobia, redness and visual disturbance.  Respiratory: Positive for apnea. Negative for shortness of breath.   Cardiovascular: Negative.  Negative for chest pain, palpitations, orthopnea, leg swelling and PND.  Gastrointestinal: Negative.   Endocrine: Negative  for polydipsia, polyphagia and polyuria.  Genitourinary: Negative.   Musculoskeletal: Negative.  Negative for back pain, gait problem, joint swelling, myalgias and neck pain.  Skin: Negative.   Neurological: Negative.  Negative for headaches.  Hematological: Negative.   Psychiatric/Behavioral: The patient is nervous/anxious. The patient is not hyperactive.        Objective:   Physical Exam  Constitutional: He is oriented to person, place, and time. He appears well-developed and well-nourished.  HENT:  Head: Normocephalic and atraumatic.  Right Ear: External ear normal.  Left Ear: External ear normal.  Nose: Nose normal.  Mouth/Throat: Oropharynx is clear and moist.  Eyes: Pupils are equal, round, and reactive to light. Conjunctivae are normal.  Neck: Normal range of motion.  Cardiovascular: Normal rate, normal heart sounds and normal pulses.   Pulmonary/Chest: Effort normal.  Abdominal: Soft. Bowel sounds are normal.  Neurological: He is alert and oriented to person, place, and time.      BP (!) 146/82 (BP Location: Left Arm, Patient Position: Sitting, Cuff Size: Large)   Pulse 63   Temp 98.5 F (36.9 C) (Oral)   Resp 14   Ht 5\' 11"  (1.803 m)   Wt 255 lb (115.7 kg)   SpO2 98%   BMI 35.57 kg/m  Assessment & Plan:  1. Essential hypertension Blood pressure has improved since previous visit. No medication changes warranted on today.  Reviewed previous laboratory values with patient, will reassess in 2 months.  2. Generalized anxiety disorder Patient states that anxiety symptoms have improved on current medication regimen. Will continue Buspar at current dosage.   Recommend counseling at [facility] walk in [facility].  GAD 7 : Generalized Anxiety Score 10/31/2016 10/09/2016  Nervous, Anxious, on Edge 1 2  Control/stop worrying 1 2  Worry too much - different things 0 2  Trouble relaxing 0 1  Restless 0 1  Easily annoyed or irritable 1 1  Afraid - awful  might happen 1 1  Total GAD 7 Score 4 10    - busPIRone (BUSPAR) 5 MG tablet; Take 1 tablet (5 mg total) by mouth 2 (two) times daily.  Dispense: 60 tablet; Refill: 5   RTC: 3 months for hypertension     [name]  MSN, FNP-C [facility] [address] [place], [place] [address] [phone]

## 2016-11-07 ENCOUNTER — Ambulatory Visit: Payer: Self-pay

## 2016-11-07 ENCOUNTER — Other Ambulatory Visit: Payer: Self-pay | Admitting: Family Medicine

## 2016-11-07 VITALS — BP 152/96

## 2016-11-07 DIAGNOSIS — I10 Essential (primary) hypertension: Secondary | ICD-10-CM

## 2016-11-07 MED ORDER — LOSARTAN POTASSIUM 50 MG PO TABS: 50.0000 mg | ORAL_TABLET | Freq: Every day | ORAL | 5 refills | Status: DC

## 2016-11-09 ENCOUNTER — Other Ambulatory Visit: Payer: Self-pay

## 2016-11-09 ENCOUNTER — Encounter (HOSPITAL_BASED_OUTPATIENT_CLINIC_OR_DEPARTMENT_OTHER): Payer: Self-pay

## 2016-11-09 DIAGNOSIS — Z992 Dependence on renal dialysis: Secondary | ICD-10-CM | POA: Insufficient documentation

## 2016-11-09 DIAGNOSIS — N186 End stage renal disease: Secondary | ICD-10-CM | POA: Insufficient documentation

## 2016-11-09 DIAGNOSIS — Q613 Polycystic kidney, unspecified: Secondary | ICD-10-CM | POA: Insufficient documentation

## 2016-11-09 DIAGNOSIS — I10 Essential (primary) hypertension: Secondary | ICD-10-CM

## 2016-11-09 MED ORDER — LOSARTAN POTASSIUM 50 MG PO TABS: 50.0000 mg | ORAL_TABLET | Freq: Every day | ORAL | 5 refills | Status: DC

## 2016-11-09 MED FILL — ALLOPURINOL 100 MG TABLET: 100 | 30 days supply | Qty: 60 | Fill #0

## 2016-11-09 MED FILL — LOSARTAN POTASSIUM 50 MG TA: 50 | 30 days supply | Qty: 30 | Fill #0

## 2016-11-09 NOTE — Telephone Encounter (Signed)
Refill for cozar sent into pharmacy. Thanks!

## 2016-11-13 MED FILL — hydrALAZINE HCL 100 MG TABS: 100 | 30 days supply | Qty: 60 | Fill #0

## 2016-11-21 MED FILL — busPIRone HCL 5 MG TABS: 5 | 30 days supply | Qty: 60 | Fill #1

## 2016-12-03 ENCOUNTER — Ambulatory Visit: Payer: Self-pay | Admitting: Family Medicine

## 2016-12-07 ENCOUNTER — Encounter: Payer: Self-pay | Admitting: Family Medicine

## 2016-12-07 ENCOUNTER — Ambulatory Visit (INDEPENDENT_AMBULATORY_CARE_PROVIDER_SITE_OTHER): Payer: Self-pay | Admitting: Family Medicine

## 2016-12-07 VITALS — BP 136/74 | HR 77 | Temp 98.0°F | Resp 18 | Ht 71.0 in | Wt 255.0 lb

## 2016-12-07 DIAGNOSIS — E65 Localized adiposity: Secondary | ICD-10-CM

## 2016-12-07 DIAGNOSIS — I10 Essential (primary) hypertension: Secondary | ICD-10-CM

## 2016-12-07 MED ORDER — ABDOMINAL BINDER/ELASTIC XL MISC: 1.0000 | Freq: Every day | 0 refills | Status: DC

## 2016-12-07 NOTE — Patient Instructions (Addendum)
Recommendation: an abdominal binder.   Recommend a lowfat, low carbohydrate diet divided over 5-6 small meals, increase water intake to 6-8 glasses, and 150 minutes per week of cardiovascular exercise.    Definitely increase fluid intake while exercising to prevent dehydration     Exercising to Lose Weight Exercising can help you to lose weight. In order to lose weight through exercise, you need to do vigorous-intensity exercise. You can tell that you are exercising with vigorous intensity if you are breathing very hard and fast and cannot hold a conversation while exercising. Moderate-intensity exercise helps to maintain your current weight. You can tell that you are exercising at a moderate level if you have a higher heart rate and faster breathing, but you are still able to hold a conversation. How often should I exercise? Choose an activity that you enjoy and set realistic goals. Your health care provider can help you to make an activity plan that works for you. Exercise regularly as directed by your health care provider. This may include:  Doing resistance training twice each week, such as: ? Push-ups. ? Sit-ups. ? Lifting weights. ? Using resistance bands.  Doing a given intensity of exercise for a given amount of time. Choose from these options: ? 150 minutes of moderate-intensity exercise every week. ? 75 minutes of vigorous-intensity exercise every week. ? A mix of moderate-intensity and vigorous-intensity exercise every week.  Children, pregnant women, people who are out of shape, people who are overweight, and older adults may need to consult a health care provider for individual recommendations. If you have any sort of medical condition, be sure to consult your health care provider before starting a new exercise program. What are some activities that can help me to lose weight?  Walking at a rate of at least 4.5 miles an hour.  Jogging or running at a rate of 5 miles per  hour.  Biking at a rate of at least 10 miles per hour.  Lap swimming.  Roller-skating or in-line skating.  Cross-country skiing.  Vigorous competitive sports, such as football, basketball, and soccer.  Jumping rope.  Aerobic dancing. How can I be more active in my day-to-day activities?  Use the stairs instead of the elevator.  Take a walk during your lunch break.  If you drive, park your car farther away from work or school.  If you take public transportation, get off one stop early and walk the rest of the way.  Make all of your phone calls while standing up and walking around.  Get up, stretch, and walk around every 30 minutes throughout the day. What guidelines should I follow while exercising?  Do not exercise so much that you hurt yourself, feel dizzy, or get very short of breath.  Consult your health care provider prior to starting a new exercise program.  Wear comfortable clothes and shoes with good support.  Drink plenty of water while you exercise to prevent dehydration or heat stroke. Body water is lost during exercise and must be replaced.  Work out until you breathe faster and your heart beats faster. This information is not intended to replace advice given to you by your health care provider. Make sure you discuss any questions you have with your health care provider. Document Released: 04/07/2010 Document Revised: 08/11/2015 Document Reviewed: 08/06/2013 Elsevier Interactive Patient Education  2018 [place].  Umbilical Hernia, Adult A hernia is a bulge of tissue that pushes through an opening between muscles. An umbilical hernia happens in  the abdomen, near the belly button (umbilicus). The hernia may contain tissues from the small intestine, large intestine, or fatty tissue covering the intestines (omentum). Umbilical hernias in adults tend to get worse over time, and they require surgical treatment. There are several types of umbilical hernias. You  may have:  A hernia located just above or below the umbilicus (indirect hernia). This is the most common type of umbilical hernia in adults.  A hernia that forms through an opening formed by the umbilicus (direct hernia).  A hernia that comes and goes (reducible hernia). A reducible hernia may be visible only when you strain, lift something heavy, or cough. This type of hernia can be pushed back into the abdomen (reduced).  A hernia that traps abdominal tissue inside the hernia (incarcerated hernia). This type of hernia cannot be reduced.  A hernia that cuts off blood flow to the tissues inside the hernia (strangulated hernia). The tissues can start to die if this happens. This type of hernia requires emergency treatment.  What are the causes? An umbilical hernia happens when tissue inside the abdomen presses on a weak area of the abdominal muscles. What increases the risk? You may have a greater risk of this condition if you:  Are obese.  Have had several pregnancies.  Have a buildup of fluid inside your abdomen (ascites).  Have had surgery that weakens the abdominal muscles.  What are the signs or symptoms? The main symptom of this condition is a painless bulge at or near the belly button. A reducible hernia may be visible only when you strain, lift something heavy, or cough. Other symptoms may include:  Dull pain.  A feeling of pressure.  Symptoms of a strangulated hernia may include:  Pain that gets increasingly worse.  Nausea and vomiting.  Pain when pressing on the hernia.  Skin over the hernia becoming red or purple.  Constipation.  Blood in the stool.  How is this diagnosed? This condition may be diagnosed based on:  A physical exam. You may be asked to cough or strain while standing. These actions increase the pressure inside your abdomen and force the hernia through the opening in your muscles. Your health care provider may try to reduce the hernia by  pressing on it.  Your symptoms and medical history.  How is this treated? Surgery is the only treatment for an umbilical hernia. Surgery for a strangulated hernia is done as soon as possible. If you have a small hernia that is not incarcerated, you may need to lose weight before having surgery. Follow these instructions at home:  Lose weight, if told by your health care provider.  Do not try to push the hernia back in.  Watch your hernia for any changes in color or size. Tell your health care provider if any changes occur.  You may need to avoid activities that increase pressure on your hernia.  Do not lift anything that is heavier than 10 lb (4.5 kg) until your health care provider says that this is safe.  Take over-the-counter and prescription medicines only as told by your health care provider.  Keep all follow-up visits as told by your health care provider. This is important. Contact a health care provider if:  Your hernia gets larger.  Your hernia becomes painful. Get help right away if:  You develop sudden, severe pain near the area of your hernia.  You have pain as well as nausea or vomiting.  You have pain and the skin  over your hernia changes color.  You develop a fever. This information is not intended to replace advice given to you by your health care provider. Make sure you discuss any questions you have with your health care provider. Document Released: 08/05/2015 Document Revised: 11/06/2015 Document Reviewed: 08/05/2015 Elsevier Interactive Patient Education  [employer].

## 2016-12-11 NOTE — Progress Notes (Signed)
Subjective:    Patient ID: Michael [name], male    DOB: Sep 13, 1975, 41 y.o.   MRN: [id number]  Michael [name], a 41 year old male with a history of hypertension, gout,  and stage 3 CKD present for a follow up of hypertension. He says that he has been taking medications consistently and following a low fat, low sodium diet. Michael [name] also exercises routinely.  He check blood pressures at home and says that they continue to fluctuate. He denies dizziness, chest pain, shortness of breath, bilateral lower extremity edema, or syncope.   Michael [name] is complaining of an abdominal hernia. He says that he was told by a friend that he has a hernia. He says that it has been difficult to decrease abdominal obesity through routine exercise. He denies abdominal discomfort, weakness, pressure, heartburn, or dysuria.     Past Medical History:  Diagnosis Date  . Hypertension   . Polycystic kidney disease    Social History   Social History  . Marital status: Single    Spouse name: N/A  . Number of children: N/A  . Years of education: N/A   Occupational History  . Not on file.   Social History Main Topics  . Smoking status: Former Research scientist (life sciences)  . Smokeless tobacco: Never Used  . Alcohol use Yes     Comment: wine occ   . Drug use: No  . Sexual activity: Not on file   Other Topics Concern  . Not on file   Social History Narrative  . No narrative on file  There is no immunization history for the selected administration types on file for this patient. Review of Systems  Constitutional: Negative.  Negative for malaise/fatigue.  HENT: Negative.   Eyes: Negative.  Negative for blurred vision, photophobia, redness and visual disturbance.  Respiratory: Negative for shortness of breath.   Cardiovascular: Negative.  Negative for chest pain, palpitations, orthopnea, leg swelling and PND.  Gastrointestinal: Negative.  Negative for abdominal distention, abdominal pain and diarrhea.  Genitourinary: Negative.   Negative for dysuria and hematuria.  Musculoskeletal: Negative.  Negative for back pain, gait problem, joint swelling, myalgias and neck pain.  Skin: Negative.   Neurological: Negative.  Negative for headaches.  Hematological: Negative.   Psychiatric/Behavioral: Negative.  The patient is not hyperactive.        Objective:   Physical Exam  Constitutional: He is oriented to person, place, and time. He appears well-developed and well-nourished.  HENT:  Head: Normocephalic and atraumatic.  Right Ear: External ear normal.  Left Ear: External ear normal.  Nose: Nose normal.  Mouth/Throat: Oropharynx is clear and moist.  Eyes: Pupils are equal, round, and reactive to light. Conjunctivae are normal.  Neck: Normal range of motion.  Cardiovascular: Normal rate, normal heart sounds and normal pulses.   Pulmonary/Chest: Effort normal.  Abdominal: Soft. Bowel sounds are normal. He exhibits no distension, no abdominal bruit and no mass. There is no tenderness.  Neurological: He is alert and oriented to person, place, and time.      BP 136/74 (BP Location: Left Arm, Patient Position: Sitting, Cuff Size: Large) Comment: manually  Pulse 77   Temp 98 F (36.7 C) (Oral)   Resp 18   Ht 5\' 11"  (1.803 m)   Wt 255 lb (115.7 kg)   SpO2 99%   BMI 35.57 kg/m  Assessment & Plan:  1. Essential hypertension Blood pressure is at goal on current medication regimen Continue medication, monitor blood pressure at home. Continue  DASH diet. Reminder to go to the ER if any CP, SOB, nausea, dizziness, severe HA, changes vision/speech, left arm numbness and tingling and jaw pain. No medication changes warranted on today    2. Abdominal obesity Hernia not evident during physical exam Recommend cardiovascular exercises to assist with strengthening core Also, recommend an abdominal binder - Elastic Bandages & Supports (ABDOMINAL BINDER/ELASTIC XL) MISC; 1 each by Does not apply route daily.  Dispense: 1 each;  Refill: 0   RTC: 3 months for hypertension   [name]  MSN, FNP-C [facility] [address] [place], [place] [address] [phone]

## 2016-12-19 MED FILL — LOSARTAN POTASSIUM 50 MG TA: 50 | 30 days supply | Qty: 30 | Fill #1

## 2016-12-19 MED FILL — busPIRone HCL 5 MG TABS: 5 | 30 days supply | Qty: 60 | Fill #2

## 2016-12-19 MED FILL — ALLOPURINOL 100 MG TABLET: 100 | 30 days supply | Qty: 60 | Fill #1

## 2016-12-19 MED FILL — hydrALAZINE HCL 100 MG TABS: 100 | 30 days supply | Qty: 60 | Fill #1

## 2016-12-19 MED FILL — ATENOLOL 25 MG TABLET: 25 | 90 days supply | Qty: 90 | Fill #1

## 2017-01-28 MED FILL — hydrALAZINE HCL 100 MG TABS: 100 | 30 days supply | Qty: 60 | Fill #2

## 2017-01-28 MED FILL — LOSARTAN POTASSIUM 50 MG TA: 50 | 30 days supply | Qty: 30 | Fill #2

## 2017-01-28 MED FILL — busPIRone HCL 5 MG TABS: 5 | 30 days supply | Qty: 60 | Fill #3

## 2017-01-28 MED FILL — ?ALLOPURINOL 100MG TABLET: 100 | 30 days supply | Qty: 60 | Fill #2

## 2017-03-01 ENCOUNTER — Ambulatory Visit: Payer: Self-pay | Admitting: Family Medicine

## 2017-03-06 ENCOUNTER — Encounter: Payer: Self-pay | Admitting: Family Medicine

## 2017-03-06 ENCOUNTER — Ambulatory Visit (INDEPENDENT_AMBULATORY_CARE_PROVIDER_SITE_OTHER): Payer: Self-pay | Admitting: Family Medicine

## 2017-03-06 VITALS — BP 150/88 | HR 84 | Temp 98.6°F | Resp 16 | Ht 71.0 in | Wt 264.0 lb

## 2017-03-06 DIAGNOSIS — G4730 Sleep apnea, unspecified: Secondary | ICD-10-CM

## 2017-03-06 DIAGNOSIS — N183 Chronic kidney disease, stage 3 unspecified: Secondary | ICD-10-CM

## 2017-03-06 DIAGNOSIS — I10 Essential (primary) hypertension: Secondary | ICD-10-CM

## 2017-03-06 DIAGNOSIS — R0683 Snoring: Secondary | ICD-10-CM

## 2017-03-06 NOTE — Patient Instructions (Signed)
Will follow up by phone with any abnormal laboratory results  We have discussed target BP range and blood pressure goal. I have advised patient to check BP regularly and to call us back or report to clinic if the numbers are consistently higher than 140/90. We discussed the importance of compliance with medical therapy and DASH diet recommended, consequences of uncontrolled hypertension discussed.  - continue current BP medications  Chronic Kidney Disease, Adult Chronic kidney disease (CKD) happens when the kidneys are damaged during a time of 3 or more months. The kidneys are two organs that do many important jobs in the body. These jobs include:  Removing wastes and extra fluids from the blood.  Making hormones that maintain the amount of fluid in your tissues and blood vessels.  Making sure that the body has the right amount of fluids and chemicals.  Most of the time, this condition does not go away, but it can usually be controlled. Steps must be taken to slow down the kidney damage or stop it from getting worse. Otherwise, the kidneys may stop working. Follow these instructions at home:  Follow your diet as told by your doctor. You may need to avoid alcohol, salty foods (sodium), and foods that are high in potassium, calcium, and protein.  Take over-the-counter and prescription medicines only as told by your doctor. Do not take any new medicines unless your doctor says you can do that. These include vitamins and minerals. ? Medicines and nutritional supplements can make kidney damage worse. ? Your doctor may need to change how much medicine you take.  Do not use any tobacco products. These include cigarettes, chewing tobacco, and e-cigarettes. If you need help quitting, ask your doctor.  Keep all follow-up visits as told by your doctor. This is important.  Check your blood pressure. Tell your doctor if there are changes to your blood pressure.  Get to a healthy weight. Stay at that  weight. If you need help with this, ask your doctor.  Start or continue an exercise plan. Try to exercise at least 30 minutes a day, 5 days a week.  Stay up-to-date with your shots (immunizations) as told by your doctor. Contact a doctor if:  Your symptoms get worse.  You have new symptoms. Get help right away if:  You have symptoms of end-stage kidney disease. These include: ? Headaches. ? Skin that is darker or lighter than normal. ? Numbness in your hands or feet. ? Easy bruising. ? Having hiccups often. ? Chest pain. ? Shortness of breath. ? Stopping of menstrual periods in women.  You have a fever.  You are making very little pee (urine).  You have pain or bleeding when you pee (urinate). This information is not intended to replace advice given to you by your health care provider. Make sure you discuss any questions you have with your health care provider. Document Released: 05/30/2009 Document Revised: 08/11/2015 Document Reviewed: 11/02/2011 Elsevier Interactive Patient Education  2017 [place] DASH stands for "Dietary Approaches to Stop Hypertension." The DASH eating plan is a healthy eating plan that has been shown to reduce high blood pressure (hypertension). It may also reduce your risk for type 2 diabetes, heart disease, and stroke. The DASH eating plan may also help with weight loss. What are tips for following this plan? General guidelines  Avoid eating more than 2,300 mg (milligrams) of salt (sodium) a day. If you have hypertension, you may need to reduce your sodium  intake to 1,500 mg a day.  Limit alcohol intake to no more than 1 drink a day for nonpregnant women and 2 drinks a day for men. One drink equals 12 oz of beer, 5 oz of wine, or 1 oz of hard liquor.  Work with your health care provider to maintain a healthy body weight or to lose weight. Ask what an ideal weight is for you.  Get at least 30 minutes of exercise that causes  your heart to beat faster (aerobic exercise) most days of the week. Activities may include walking, swimming, or biking.  Work with your health care provider or diet and nutrition specialist (dietitian) to adjust your eating plan to your individual calorie needs. Reading food labels  Check food labels for the amount of sodium per serving. Choose foods with less than 5 percent of the Daily Value of sodium. Generally, foods with less than 300 mg of sodium per serving fit into this eating plan.  To find whole grains, look for the word "whole" as the first word in the ingredient list. Shopping  Buy products labeled as "low-sodium" or "no salt added."  Buy fresh foods. Avoid canned foods and premade or frozen meals. Cooking  Avoid adding salt when cooking. Use salt-free seasonings or herbs instead of table salt or sea salt. Check with your health care provider or pharmacist before using salt substitutes.  Do not fry foods. Cook foods using healthy methods such as baking, boiling, grilling, and broiling instead.  Cook with heart-healthy oils, such as olive, canola, soybean, or sunflower oil. Meal planning   Eat a balanced diet that includes: ? 5 or more servings of fruits and vegetables each day. At each meal, try to fill half of your plate with fruits and vegetables. ? Up to 6-8 servings of whole grains each day. ? Less than 6 oz of lean meat, poultry, or fish each day. A 3-oz serving of meat is about the same size as a deck of cards. One egg equals 1 oz. ? 2 servings of low-fat dairy each day. ? A serving of nuts, seeds, or beans 5 times each week. ? Heart-healthy fats. Healthy fats called Omega-3 fatty acids are found in foods such as flaxseeds and coldwater fish, like sardines, salmon, and mackerel.  Limit how much you eat of the following: ? Canned or prepackaged foods. ? Food that is high in trans fat, such as fried foods. ? Food that is high in saturated fat, such as fatty  meat. ? Sweets, desserts, sugary drinks, and other foods with added sugar. ? Full-fat dairy products.  Do not salt foods before eating.  Try to eat at least 2 vegetarian meals each week.  Eat more home-cooked food and less restaurant, buffet, and fast food.  When eating at a restaurant, ask that your food be prepared with less salt or no salt, if possible. What foods are recommended? The items listed may not be a complete list. Talk with your dietitian about what dietary choices are best for you. Grains Whole-grain or whole-wheat bread. Whole-grain or whole-wheat pasta. Brown rice. [name]. Bulgur. Whole-grain and low-sodium cereals. Pita bread. Low-fat, low-sodium crackers. Whole-wheat flour tortillas. Vegetables Fresh or frozen vegetables (raw, steamed, roasted, or grilled). Low-sodium or reduced-sodium tomato and vegetable juice. Low-sodium or reduced-sodium tomato sauce and tomato paste. Low-sodium or reduced-sodium canned vegetables. Fruits All fresh, dried, or frozen fruit. Canned fruit in natural juice (without added sugar). Meat and other protein foods Skinless chicken or [place].  Ground chicken or [place]. Pork with fat trimmed off. Fish and seafood. Egg whites. Dried beans, peas, or lentils. Unsalted nuts, nut butters, and seeds. Unsalted canned beans. Lean cuts of beef with fat trimmed off. Low-sodium, lean deli meat. Dairy Low-fat (1%) or fat-free (skim) milk. Fat-free, low-fat, or reduced-fat cheeses. Nonfat, low-sodium ricotta or cottage cheese. Low-fat or nonfat yogurt. Low-fat, low-sodium cheese. Fats and oils Soft margarine without trans fats. Vegetable oil. Low-fat, reduced-fat, or light mayonnaise and salad dressings (reduced-sodium). Canola, safflower, olive, soybean, and sunflower oils. Avocado. Seasoning and other foods Herbs. Spices. Seasoning mixes without salt. Unsalted popcorn and pretzels. Fat-free sweets. What foods are not recommended? The items listed  may not be a complete list. Talk with your dietitian about what dietary choices are best for you. Grains Baked goods made with fat, such as croissants, muffins, or some breads. Dry pasta or rice meal packs. Vegetables Creamed or fried vegetables. Vegetables in a cheese sauce. Regular canned vegetables (not low-sodium or reduced-sodium). Regular canned tomato sauce and paste (not low-sodium or reduced-sodium). Regular tomato and vegetable juice (not low-sodium or reduced-sodium). [name]. Olives. Fruits Canned fruit in a light or heavy syrup. Fried fruit. Fruit in cream or butter sauce. Meat and other protein foods Fatty cuts of meat. Ribs. Fried meat. [name]. Sausage. Bologna and other processed lunch meats. Salami. Fatback. Hotdogs. Bratwurst. Salted nuts and seeds. Canned beans with added salt. Canned or smoked fish. Whole eggs or egg yolks. Chicken or [place] with skin. Dairy Whole or 2% milk, cream, and half-and-half. Whole or full-fat cream cheese. Whole-fat or sweetened yogurt. Full-fat cheese. Nondairy creamers. Whipped toppings. Processed cheese and cheese spreads. Fats and oils Butter. Stick margarine. Lard. Shortening. Ghee. Bacon fat. Tropical oils, such as coconut, palm kernel, or palm oil. Seasoning and other foods Salted popcorn and pretzels. Onion salt, garlic salt, seasoned salt, table salt, and sea salt. Worcestershire sauce. Tartar sauce. Barbecue sauce. Teriyaki sauce. Soy sauce, including reduced-sodium. Steak sauce. Canned and packaged gravies. Fish sauce. Oyster sauce. Cocktail sauce. Horseradish that you find on the shelf. Ketchup. Mustard. Meat flavorings and tenderizers. Bouillon cubes. Hot sauce and Tabasco sauce. Premade or packaged marinades. Premade or packaged taco seasonings. Relishes. Regular salad dressings. Where to find more information:  National Heart, Lung, and [place]: https://wilson-eaton.com/  American Heart Association: www.heart.org Summary  The DASH  eating plan is a healthy eating plan that has been shown to reduce high blood pressure (hypertension). It may also reduce your risk for type 2 diabetes, heart disease, and stroke.  With the DASH eating plan, you should limit salt (sodium) intake to 2,300 mg a day. If you have hypertension, you may need to reduce your sodium intake to 1,500 mg a day.  When on the DASH eating plan, aim to eat more fresh fruits and vegetables, whole grains, lean proteins, low-fat dairy, and heart-healthy fats.  Work with your health care provider or diet and nutrition specialist (dietitian) to adjust your eating plan to your individual calorie needs. This information is not intended to replace advice given to you by your health care provider. Make sure you discuss any questions you have with your health care provider. Document Released: 02/22/2011 Document Revised: 02/27/2016 Document Reviewed: 02/27/2016 Elsevier Interactive Patient Education  [employer].

## 2017-03-06 NOTE — Progress Notes (Signed)
Subjective:    Patient ID: Michael [name], male    DOB: 1975/06/21, 41 y.o.   MRN: [id number]  Mr. Thaddaeus Granja, a 41 year old male with a history of hypertension, gout,  and stage 3 CKD present for a follow up. He says that he has been taking medications consistently and following a low fat, low sodium diet. He started an exercise regimen as well. He check blood pressures at home and says that they have been fluctuating. He has started check blood pressures at [facility] and all have been within a normal range.  Trig is also complaining of possible obstructive sleep apnea. Patent has symptoms of snoring and daytime sleepiness. He generally gets 5 hours of uninterupted sleep. Snoring is of moderate severity. He also says that his fiance has witness apneic episodes.    Hypertension  This is a chronic problem. The current episode started more than 1 month ago. The problem is uncontrolled. Pertinent negatives include no anxiety, blurred vision, chest pain, headaches, malaise/fatigue, neck pain, orthopnea, palpitations, peripheral edema, PND, shortness of breath or sweats. Risk factors for coronary artery disease include obesity, male gender and dyslipidemia. The current treatment provides mild improvement. There are no compliance problems.  There is no history of angina, kidney disease, CAD/MI, CVA, heart failure, left ventricular hypertrophy, PVD or retinopathy. Identifiable causes of hypertension include chronic renal disease.    Past Medical History:  Diagnosis Date  . Hypertension   . Polycystic kidney disease    Social History   Socioeconomic History  . Marital status: Single    Spouse name: Not on file  . Number of children: Not on file  . Years of education: Not on file  . Highest education level: Not on file  Social Needs  . Financial resource strain: Not on file  . Food insecurity - worry: Not on file  . Food insecurity - inability: Not on file  . Transportation needs - medical: Not on  file  . Transportation needs - non-medical: Not on file  Occupational History  . Not on file  Tobacco Use  . Smoking status: Former Research scientist (life sciences)  . Smokeless tobacco: Never Used  Substance and Sexual Activity  . Alcohol use: Yes    Comment: wine occ   . Drug use: No  . Sexual activity: Not on file  Other Topics Concern  . Not on file  Social History Narrative  . Not on file   Immunization History  Administered Date(s) Administered  . Influenza,inj,Quad PF,6+ Mos 01/30/2017   Review of Systems  Constitutional: Negative.  Negative for malaise/fatigue.  HENT: Negative.   Eyes: Negative.  Negative for blurred vision, photophobia, redness and visual disturbance.  Respiratory: Positive for apnea. Negative for shortness of breath.   Cardiovascular: Negative.  Negative for chest pain, palpitations, orthopnea, leg swelling and PND.  Gastrointestinal: Negative.   Endocrine: Negative for polydipsia, polyphagia and polyuria.  Genitourinary: Negative.   Musculoskeletal: Negative.  Negative for back pain, gait problem, joint swelling, myalgias and neck pain.  Skin: Negative.   Neurological: Negative.  Negative for headaches.  Hematological: Negative.   Psychiatric/Behavioral: Negative.  The patient is not hyperactive.        Objective:   Physical Exam  Constitutional: He is oriented to person, place, and time. He appears well-developed and well-nourished.  HENT:  Head: Normocephalic and atraumatic.  Right Ear: External ear normal.  Left Ear: External ear normal.  Nose: Nose normal.  Mouth/Throat: Oropharynx is clear and moist.  Eyes: Conjunctivae are normal. Pupils are equal, round, and reactive to light.  Neck: Normal range of motion.  Cardiovascular: Normal rate, normal heart sounds and normal pulses.  Pulmonary/Chest: Effort normal.  Abdominal: Soft. Bowel sounds are normal.  Neurological: He is alert and oriented to person, place, and time.      BP (!) 150/88 (BP Location: Left  Arm, Patient Position: Sitting, Cuff Size: Large) Comment: manually  Pulse 84   Temp 98.6 F (37 C) (Oral)   Resp 16   Ht 5\' 11"  (1.803 m)   Wt 264 lb (119.7 kg)   SpO2 98%   BMI 36.82 kg/m  Assessment & Plan:   Essential hypertension, benign Blood pressure continues to be above goal.  We have discussed target BP range and blood pressure goal. I have advised patient to check BP regularly and to call us back or report to clinic if the numbers are consistently higher than 140/90. We discussed the importance of compliance with medical therapy and DASH diet recommended, consequences of uncontrolled hypertension discussed.  - continue current BP medications  - Basic Metabolic Panel  CKD (chronic kidney disease) stage 3, GFR 30-59 ml/min (HCC)  - Basic Metabolic Panel  Observed sleep apnea - Split night study; Future  Primary snoring - Split night study; Future  RTC: 1 week for bp check and 3 months for hypertension   [name]  MSN, FNP-C [facility] [address] [place], [place] [address] [phone]

## 2017-03-07 LAB — BASIC METABOLIC PANEL
BUN/Creatinine Ratio: 13 (ref 9–20)
BUN: 31 mg/dL — ABNORMAL HIGH (ref 6–24)
CO2: 18 mmol/L — ABNORMAL LOW (ref 20–29)
Calcium: 8.9 mg/dL (ref 8.7–10.2)
Chloride: 106 mmol/L (ref 96–106)
Creatinine, Ser: 2.3 mg/dL — ABNORMAL HIGH (ref 0.76–1.27)
GFR calc Af Amer: 39 mL/min/{1.73_m2} — ABNORMAL LOW (ref >59)
GFR calc non Af Amer: 34 mL/min/{1.73_m2} — ABNORMAL LOW (ref >59)
Glucose: 127 mg/dL — ABNORMAL HIGH (ref 65–99)
Potassium: 4.7 mmol/L (ref 3.5–5.2)
Sodium: 142 mmol/L (ref 134–144)

## 2017-03-07 LAB — POCT URINALYSIS DIP (DEVICE)
Bilirubin Urine: NEGATIVE
Glucose, UA: NEGATIVE mg/dL
Hgb urine dipstick: NEGATIVE
Ketones, ur: NEGATIVE mg/dL
Leukocytes, UA: NEGATIVE
Nitrite: NEGATIVE
Protein, ur: 30 mg/dL — AB
Specific Gravity, Urine: 1.02 (ref 1.005–1.030)
Urobilinogen, UA: 1 mg/dL (ref 0.0–1.0)
pH: 6 (ref 5.0–8.0)

## 2017-03-08 ENCOUNTER — Other Ambulatory Visit: Payer: Self-pay | Admitting: Family Medicine

## 2017-03-08 DIAGNOSIS — N183 Chronic kidney disease, stage 3 unspecified: Secondary | ICD-10-CM

## 2017-03-10 ENCOUNTER — Other Ambulatory Visit: Payer: Self-pay | Admitting: Family Medicine

## 2017-03-10 NOTE — Progress Notes (Signed)
Creatinine level is mildly elevated, which is consistent with stage 3 chronic kidney disease. Referral sent to nephrology for further workup and evaluation.   [name]  MSN, FNP-C Patient [facility] [address] [place], [place] [address] [phone]

## 2017-03-13 ENCOUNTER — Telehealth: Payer: Self-pay

## 2017-03-13 NOTE — Telephone Encounter (Signed)
-----   Message from [name], [place] sent at 03/10/2017  7:28 AM EST ----- Regarding: lab resutls Please inform patient that creatinine level continues to be elevated. A referral has been sent to [facility] for further work up and evaluation. Please call [facility] to inquire about self pay options for Mr. Russ. Also, have patient complete financial assistance forms as discussed during previous appointment.   Thanks

## 2017-03-13 NOTE — Telephone Encounter (Signed)
Called and spoke with patient, advised that creatinine level continues to be elevated and we are going to send a referral for [facility] for futher work up and evaluation. Patient verbalized understanding. Thanks!

## 2017-03-25 MED FILL — hydrALAZINE HCL 100 MG TABS: 100 | 30 days supply | Qty: 60 | Fill #3

## 2017-03-25 MED FILL — LOSARTAN POTASSIUM 50 MG TA: 50 | 30 days supply | Qty: 30 | Fill #3

## 2017-03-25 MED FILL — busPIRone HCL 5 MG TABS: 5 | 30 days supply | Qty: 60 | Fill #4

## 2017-03-25 MED FILL — ?ALLOPURINOL 100 MG TABS: 100 | 30 days supply | Qty: 60 | Fill #3

## 2017-03-25 MED FILL — ATENOLOL 25 MG TABLET: 25 | 30 days supply | Qty: 30 | Fill #0

## 2017-03-28 ENCOUNTER — Encounter: Payer: Self-pay | Admitting: Family Medicine

## 2017-03-28 ENCOUNTER — Ambulatory Visit (INDEPENDENT_AMBULATORY_CARE_PROVIDER_SITE_OTHER): Payer: Self-pay | Admitting: Family Medicine

## 2017-03-28 ENCOUNTER — Ambulatory Visit (HOSPITAL_COMMUNITY)
Admission: RE | Admit: 2017-03-28 | Discharge: 2017-03-28 | Disposition: A | Discharge status: Home / Self Care | Payer: Self-pay | Source: Ambulatory Visit | Attending: Family Medicine | Admitting: Family Medicine

## 2017-03-28 VITALS — BP 120/72 | HR 65 | Temp 98.1°F | Resp 16 | Ht 71.0 in | Wt 259.0 lb

## 2017-03-28 DIAGNOSIS — R1084 Generalized abdominal pain: Secondary | ICD-10-CM

## 2017-03-28 DIAGNOSIS — K5900 Constipation, unspecified: Secondary | ICD-10-CM | POA: Insufficient documentation

## 2017-03-28 LAB — POCT URINALYSIS DIP (DEVICE)
Bilirubin Urine: NEGATIVE
Glucose, UA: NEGATIVE mg/dL
Hgb urine dipstick: NEGATIVE
Ketones, ur: NEGATIVE mg/dL
Leukocytes, UA: NEGATIVE
Nitrite: NEGATIVE
Protein, ur: 100 mg/dL — AB
Specific Gravity, Urine: 1.02 (ref 1.005–1.030)
Urobilinogen, UA: 0.2 mg/dL (ref 0.0–1.0)
pH: 5 (ref 5.0–8.0)

## 2017-03-28 NOTE — Patient Instructions (Addendum)
Will follow up by phone after reviewing abdominal xray.  Provided financial assistance forms for [facility]    Constipation, Adult Constipation is when a person:  Poops (has a bowel movement) fewer times in a week than normal.  Has a hard time pooping.  Has poop that is dry, hard, or bigger than normal.  Follow these instructions at home: Eating and drinking   Eat foods that have a lot of fiber, such as: ? Fresh fruits and vegetables. ? Whole grains. ? Beans.  Eat less of foods that are high in fat, low in fiber, or overly processed, such as: ? [place] fries. ? Hamburgers. ? Cookies. ? Candy. ? Soda.  Drink enough fluid to keep your pee (urine) clear or pale yellow. General instructions  Exercise regularly or as told by your doctor.  Go to the restroom when you feel like you need to poop. Do not hold it in.  Take over-the-counter and prescription medicines only as told by your doctor. These include any fiber supplements.  Do pelvic floor retraining exercises, such as: ? Doing deep breathing while relaxing your lower belly (abdomen). ? Relaxing your pelvic floor while pooping.  Watch your condition for any changes.  Keep all follow-up visits as told by your doctor. This is important. Contact a doctor if:  You have pain that gets worse.  You have a fever.  You have not pooped for 4 days.  You throw up (vomit).  You are not hungry.  You lose weight.  You are bleeding from the anus.  You have thin, pencil-like poop (stool). Get help right away if:  You have a fever, and your symptoms suddenly get worse.  You leak poop or have blood in your poop.  Your belly feels hard or bigger than normal (is bloated).  You have very bad belly pain.  You feel dizzy or you faint. This information is not intended to replace advice given to you by your health care provider. Make sure you discuss any questions you have with your health care  provider. Document Released: 08/22/2007 Document Revised: 09/23/2015 Document Reviewed: 08/24/2015 Elsevier Interactive Patient Education  2018 [employer].

## 2017-03-28 NOTE — Progress Notes (Signed)
Subjective:     Michael [name] is a 42 y.o. male with a history of hypertension and CKD presents complaining of abdominal discomfort and constipation over the past 3 days. Patient states that he has had increased flatulence and hard infrequent stools. He says that prior to 1 week ago, his diet mostly consisted of fast food. He started a low fat diiet 1 week ago. He says that he has had abdominal discomfort since changing diet.  Patient has been having occasional formed stools. Defecation has been difficult.  Symptoms have been intermittent. Patient has not attempted any OTC interventions to alleviate current symptoms. Patient denies fatigue, rectal pain, or rectal bleeding.  Past Medical History:  Diagnosis Date  . Hypertension   . Polycystic kidney disease    Social History   Socioeconomic History  . Marital status: Single    Spouse name: Not on file  . Number of children: Not on file  . Years of education: Not on file  . Highest education level: Not on file  Social Needs  . Financial resource strain: Not on file  . Food insecurity - worry: Not on file  . Food insecurity - inability: Not on file  . Transportation needs - medical: Not on file  . Transportation needs - non-medical: Not on file  Occupational History  . Not on file  Tobacco Use  . Smoking status: Former Research scientist (life sciences)  . Smokeless tobacco: Never Used  Substance and Sexual Activity  . Alcohol use: Yes    Comment: wine occ   . Drug use: No  . Sexual activity: Not on file  Other Topics Concern  . Not on file  Social History Narrative  . Not on file   Immunization History  Administered Date(s) Administered  . Influenza,inj,Quad PF,6+ Mos 01/30/2017  Review of Systems  Constitutional: Negative.   HENT: Negative.   Eyes: Negative.   Respiratory: Negative.   Cardiovascular: Negative.   Gastrointestinal: Positive for abdominal pain, constipation and nausea. Negative for blood in stool and diarrhea.  Musculoskeletal:  Negative.   Skin: Negative.   Neurological: Negative.   Endo/Heme/Allergies: Negative.   Psychiatric/Behavioral: Negative.      Objective:  Physical Exam  Constitutional: He is well-developed, well-nourished, and in no distress.  HENT:  Head: Normocephalic.  Eyes: Pupils are equal, round, and reactive to light.  Cardiovascular: Normal rate, regular rhythm and normal heart sounds.  Pulmonary/Chest: Effort normal and breath sounds normal.  Abdominal: He exhibits distension. Bowel sounds are hyperactive. There is tenderness. There is no rigidity, no guarding, no tenderness at McBurney's point and negative Murphy's sign.  Skin: Skin is warm and dry.    Assessment:    Constipation  BP 120/72 (BP Location: Left Arm, Patient Position: Sitting, Cuff Size: Large)   Pulse 65   Temp 98.1 F (36.7 C) (Oral)   Resp 16   Ht 5\' 11"  (1.803 m)   Wt 259 lb (117.5 kg)   SpO2 99%   BMI 36.12 kg/m  Plan:   Generalized abdominal pain - Comprehensive metabolic panel - CBC - DG Abd 2 Views; Future  Constipation, unspecified constipation type Will follow up by phone after reviewed abdominal xray - DG Abd 2 Views; Future  Education about constipation causes and treatment discussed. Laboratory tests per orders. Plain films (flat plate/upright).     RTC: Will follow up by phone after reviewing laboratory results and [place]  MSN, FNP-C Patient [facility]  [place], [place] [address] [phone]

## 2017-03-29 ENCOUNTER — Telehealth: Payer: Self-pay

## 2017-03-29 LAB — COMPREHENSIVE METABOLIC PANEL
ALT: 26 IU/L (ref 0–44)
AST: 21 IU/L (ref 0–40)
Albumin/Globulin Ratio: 1.4 (ref 1.2–2.2)
Albumin: 4.4 g/dL (ref 3.5–5.5)
Alkaline Phosphatase: 57 IU/L (ref 39–117)
BUN/Creatinine Ratio: 16 (ref 9–20)
BUN: 46 mg/dL — ABNORMAL HIGH (ref 6–24)
Bilirubin Total: 0.3 mg/dL (ref 0.0–1.2)
CO2: 16 mmol/L — ABNORMAL LOW (ref 20–29)
Calcium: 9.2 mg/dL (ref 8.7–10.2)
Chloride: 105 mmol/L (ref 96–106)
Creatinine, Ser: 2.91 mg/dL — ABNORMAL HIGH (ref 0.76–1.27)
GFR calc Af Amer: 30 mL/min/{1.73_m2} — ABNORMAL LOW (ref >59)
GFR calc non Af Amer: 26 mL/min/{1.73_m2} — ABNORMAL LOW (ref >59)
Globulin, Total: 3.2 g/dL (ref 1.5–4.5)
Glucose: 103 mg/dL — ABNORMAL HIGH (ref 65–99)
Potassium: 5 mmol/L (ref 3.5–5.2)
Sodium: 138 mmol/L (ref 134–144)
Total Protein: 7.6 g/dL (ref 6.0–8.5)

## 2017-03-29 LAB — CBC
Hematocrit: 40.4 % (ref 37.5–51.0)
Hemoglobin: 13.5 g/dL (ref 13.0–17.7)
MCH: 28 pg (ref 26.6–33.0)
MCHC: 33.4 g/dL (ref 31.5–35.7)
MCV: 84 fL (ref 79–97)
Platelets: 313 10*3/uL (ref 150–379)
RBC: 4.82 x10E6/uL (ref 4.14–5.80)
RDW: 15 % (ref 12.3–15.4)
WBC: 10 10*3/uL (ref 3.4–10.8)

## 2017-03-29 NOTE — Telephone Encounter (Signed)
-----   Message from [name], [place] sent at 03/29/2017  3:10 PM EST ----- Regarding: lab results Please inform patient that abdominal ultrasound was unremarkable. Will send in polyethylene glycol for constipation daily for 7 days. A high fiber diet with plenty of fluids (up to 8 glasses of water daily) is suggested to relieve these symptoms.    Also, inform patient that urine creatinine, an indicator of kidney functioning remains elevated. Remind him of the importance of completing FA forms for [facility].  Thanks

## 2017-03-29 NOTE — Telephone Encounter (Signed)
Called and spoke with patient. Advised of normal abd ultrasound and to take polyethylene glycol for constipation daily for 7 days. Asked to eat a high fiber diet with plenty of fluids. Also reminded that kidney functio remains elevated and that he should fill out forms for assistance for [facility] so we can send a referral for him. Patient verbalized understanding. Thanks!

## 2017-04-01 ENCOUNTER — Ambulatory Visit: Payer: Self-pay | Attending: Internal Medicine

## 2017-04-08 ENCOUNTER — Ambulatory Visit (HOSPITAL_BASED_OUTPATIENT_CLINIC_OR_DEPARTMENT_OTHER): Payer: Self-pay | Attending: Family Medicine | Admitting: Internal Medicine

## 2017-04-08 VITALS — Ht 73.0 in | Wt 254.0 lb

## 2017-04-08 DIAGNOSIS — R0683 Snoring: Secondary | ICD-10-CM | POA: Insufficient documentation

## 2017-04-08 DIAGNOSIS — G4733 Obstructive sleep apnea (adult) (pediatric): Secondary | ICD-10-CM | POA: Insufficient documentation

## 2017-04-08 DIAGNOSIS — I493 Ventricular premature depolarization: Secondary | ICD-10-CM | POA: Insufficient documentation

## 2017-04-08 DIAGNOSIS — G4730 Sleep apnea, unspecified: Secondary | ICD-10-CM

## 2017-04-08 DIAGNOSIS — I10 Essential (primary) hypertension: Secondary | ICD-10-CM | POA: Insufficient documentation

## 2017-04-08 DIAGNOSIS — R5383 Other fatigue: Secondary | ICD-10-CM | POA: Insufficient documentation

## 2017-04-08 DIAGNOSIS — G4739 Other sleep apnea: Secondary | ICD-10-CM

## 2017-04-08 DIAGNOSIS — Z79899 Other long term (current) drug therapy: Secondary | ICD-10-CM | POA: Insufficient documentation

## 2017-04-08 DIAGNOSIS — G4719 Other hypersomnia: Secondary | ICD-10-CM | POA: Insufficient documentation

## 2017-04-14 DIAGNOSIS — G4739 Other sleep apnea: Secondary | ICD-10-CM

## 2017-04-14 NOTE — Procedures (Signed)
    Patient Name: Michael [name], [place] Date: 04/08/2017 Gender: Male D.O.B: 20-Jul-1975 Age (years): 41 Referring Provider: Cammie Sickle Height (inches): 38 Interpreting Physician: Baird Lyons MD, ABSM Weight (lbs): 254 RPSGT: Carolin Coy BMI: 34 MRN: [id number] Neck Size: 18.00 <br> <br> CLINICAL INFORMATION Sleep Study Type: NPSG  Indication for sleep study: Excessive Daytime Sleepiness, Fatigue, Hypertension, OSA, Snoring  Epworth Sleepiness Score: 2  SLEEP STUDY TECHNIQUE As per the AASM Manual for the Scoring of Sleep and Associated Events v2.3 (April 2016) with a hypopnea requiring 4% desaturations.  The channels recorded and monitored were frontal, central and occipital EEG, electrooculogram (EOG), submentalis EMG (chin), nasal and oral airflow, thoracic and abdominal wall motion, anterior tibialis EMG, snore microphone, electrocardiogram, and pulse oximetry.  MEDICATIONS Medications self-administered by patient taken the night of the study : ALLOPURINOL, BUSPAR, [place] The study was initiated at 11:19:44 PM and ended at 5:30:10 AM.  Sleep onset time was 89.4 minutes and the sleep efficiency was 71.3%. The total sleep time was 264.0 minutes.  Stage REM latency was 15.0 minutes.  The patient spent 7.58% of the night in stage N1 sleep, 66.86% in stage N2 sleep, 0.00% in stage N3 and 25.57% in REM.  Alpha intrusion was absent.  Supine sleep was 36.94%.  RESPIRATORY PARAMETERS The overall apnea/hypopnea index (AHI) was 13.4 per hour. There were 48 total apneas, including 42 obstructive, 4 central and 2 mixed apneas. There were 11 hypopneas and 13 RERAs.  The AHI during Stage REM sleep was 33.8 per hour.  AHI while supine was 29.5 per hour.  The mean oxygen saturation was 96.71%. The minimum SpO2 during sleep was 85.00%.  moderate snoring was noted during this study.  CARDIAC DATA The 2 lead EKG demonstrated sinus rhythm. The mean  heart rate was 57.84 beats per minute. Other EKG findings include: PVCs.  LEG MOVEMENT DATA The total PLMS were 0 with a resulting PLMS index of 0.00. Associated arousal with leg movement index was 0.0 .  IMPRESSIONS - Mild obstructive sleep apnea occurred during this study (AHI = 13.4/h). Events were mostly associated with REMsleep. - Insufficient early events and REM to meet protocol requirements for split CPAP titration. - No significant central sleep apnea occurred during this study (CAI = 0.9/h). - Mild oxygen desaturation was noted during this study (Min O2 = 85.00%). - The patient snored with moderate snoring volume. - EKG findings include PVCs. - Clinically significant periodic limb movements did not occur during sleep. No significant associated arousals.  DIAGNOSIS - Obstructive Sleep Apnea (327.23 [G47.33 ICD-10])  RECOMMENDATIONS - Suggest CPAP titration or AutoPAP. Other options would be based on clinical judgment. - Positional therapy avoiding supine position during sleep. - Be careful with alcohol, sedatives and other CNS depressants that may worsen sleep apnea and disrupt normal sleep architecture. - Sleep hygiene should be reviewed to assess factors that may improve sleep quality. - Weight management and regular exercise should be initiated or continued if appropriate.  [Electronically signed] 04/14/2017 10:35 AM  Baird Lyons MD, ABSM Diplomate, American Board of Sleep Medicine   NPI: [id number]                         [place], [place] of Sleep Medicine  ELECTRONICALLY SIGNED ON:  04/14/2017, 10:32 AM [place] PH: [phone]   FX: [phone] [place]

## 2017-04-16 ENCOUNTER — Telehealth: Payer: Self-pay

## 2017-04-16 NOTE — Telephone Encounter (Signed)
-----   Message from Michael [name], [place] sent at 04/14/2017  4:18 PM EST ----- Regarding: Sleep study results Please inform patient that sleep study showed obstructive sleep apnea.  Will complete form for advanced home care.  Patient will be contacted. Thanks

## 2017-04-16 NOTE — Telephone Encounter (Signed)
Called and spoke with patient. Advised that he does have sleep apnea and that we are going to fill out order for c-pap supplies and fax into sleep center. Patient verbalized understanding. Thanks!

## 2017-05-06 MED FILL — ALLOPURINOL 100 MG TABLET: 100 | 30 days supply | Qty: 60 | Fill #4

## 2017-05-06 MED FILL — hydrALAZINE HCL 100 MG TABS: 100 | 30 days supply | Qty: 60 | Fill #4

## 2017-05-06 MED FILL — LOSARTAN POTASSIUM 50 MG TA: 50 | 30 days supply | Qty: 30 | Fill #4

## 2017-05-10 MED FILL — ATENOLOL 25 MG TABLET: 25 | 30 days supply | Qty: 30 | Fill #1

## 2017-05-22 ENCOUNTER — Other Ambulatory Visit: Payer: Self-pay | Admitting: Nephrology

## 2017-05-23 ENCOUNTER — Other Ambulatory Visit: Payer: Self-pay | Admitting: Nephrology

## 2017-05-23 DIAGNOSIS — Q612 Polycystic kidney, adult type: Secondary | ICD-10-CM

## 2017-06-05 ENCOUNTER — Ambulatory Visit (INDEPENDENT_AMBULATORY_CARE_PROVIDER_SITE_OTHER): Payer: Self-pay | Admitting: Family Medicine

## 2017-06-05 ENCOUNTER — Encounter: Payer: Self-pay | Admitting: Family Medicine

## 2017-06-05 DIAGNOSIS — F411 Generalized anxiety disorder: Secondary | ICD-10-CM

## 2017-06-05 DIAGNOSIS — I10 Essential (primary) hypertension: Secondary | ICD-10-CM

## 2017-06-05 DIAGNOSIS — M1A30X0 Chronic gout due to renal impairment, unspecified site, without tophus (tophi): Secondary | ICD-10-CM

## 2017-06-05 LAB — POCT URINALYSIS DIP (DEVICE)
Bilirubin Urine: NEGATIVE
Glucose, UA: NEGATIVE mg/dL
Hgb urine dipstick: NEGATIVE
Ketones, ur: NEGATIVE mg/dL
Leukocytes, UA: NEGATIVE
Nitrite: NEGATIVE
Protein, ur: NEGATIVE mg/dL
Specific Gravity, Urine: 1.01 (ref 1.005–1.030)
Urobilinogen, UA: 0.2 mg/dL (ref 0.0–1.0)
pH: 6 (ref 5.0–8.0)

## 2017-06-05 MED ORDER — LOSARTAN POTASSIUM 50 MG PO TABS: 50.0000 mg | ORAL_TABLET | Freq: Every day | ORAL | 5 refills | Status: DC

## 2017-06-05 MED ORDER — BUSPIRONE HCL 5 MG PO TABS: 5.0000 mg | ORAL_TABLET | Freq: Two times a day (BID) | ORAL | 5 refills | Status: DC

## 2017-06-05 MED ORDER — ATENOLOL 25 MG PO TABS: 25.0000 mg | ORAL_TABLET | Freq: Two times a day (BID) | ORAL | 1 refills | Status: DC

## 2017-06-05 MED ORDER — HYDRALAZINE HCL 50 MG PO TABS: 50.0000 mg | ORAL_TABLET | Freq: Three times a day (TID) | ORAL | 1 refills | Status: DC

## 2017-06-05 MED ORDER — ALLOPURINOL 100 MG PO TABS: 100.0000 mg | ORAL_TABLET | Freq: Two times a day (BID) | ORAL | 5 refills | Status: DC

## 2017-06-05 MED FILL — hydrALAZINE HCL 50 MG TABS: 50 | 30 days supply | Qty: 90 | Fill #0

## 2017-06-05 MED FILL — ATENOLOL 25 MG TABLET: 25 | 30 days supply | Qty: 60 | Fill #0

## 2017-06-05 MED FILL — LOSARTAN POTASSIUM 50 MG TA: 50 | 30 days supply | Qty: 30 | Fill #0

## 2017-06-05 MED FILL — ALLOPURINOL 100 MG TABS: 100 | 30 days supply | Qty: 60 | Fill #0

## 2017-06-05 NOTE — Patient Instructions (Addendum)
You are complaining of gas, start over the counter simethecone or Gas-X as needed. Continue to monitor dairy intake.   Your blood pressure is above goal on current medication regimen. Goal is < 140/90. It is important that you take all medications consistently.

## 2017-06-05 NOTE — Progress Notes (Signed)
Subjective:    Patient ID: Michael [name], male    DOB: Oct 28, 1975, 42 y.o.   MRN: [id number]  Michael [name], a 42 year old male with a history of hypertension, gout,  and stage 3 CKD present for a follow up of hypertension. He says that he has been taking medications consistently and following a low fat, low sodium diet. He started an exercise regimen as well. He check blood pressures at home. He says that blood pressure has improved since he has been taking medications correctly. He denies dizziness, chest pain/pressure, heart palpitations, fatigue, edema, or syncope. Patient established care with [facility] 1 month ago for chronic kidney disease.      Hypertension  Associated symptoms include anxiety. Pertinent negatives include no blurred vision, chest pain, headaches, malaise/fatigue, neck pain, orthopnea, palpitations, peripheral edema, PND, shortness of breath or sweats. There is no history of angina, kidney disease, CAD/MI, CVA, heart failure, left ventricular hypertrophy, PVD or retinopathy. Identifiable causes of hypertension include chronic renal disease.  Medication Refill  Pertinent negatives include no chest pain, headaches, joint swelling, myalgias or neck pain.    Past Medical History:  Diagnosis Date  . Hypertension   . Polycystic kidney disease    Social History   Socioeconomic History  . Marital status: Single    Spouse name: Not on file  . Number of children: Not on file  . Years of education: Not on file  . Highest education level: Not on file  Social Needs  . Financial resource strain: Not on file  . Food insecurity - worry: Not on file  . Food insecurity - inability: Not on file  . Transportation needs - medical: Not on file  . Transportation needs - non-medical: Not on file  Occupational History  . Not on file  Tobacco Use  . Smoking status: Former Research scientist (life sciences)  . Smokeless tobacco: Never Used  Substance and Sexual Activity  . Alcohol use:  Yes    Comment: wine occ   . Drug use: No  . Sexual activity: Not on file  Other Topics Concern  . Not on file  Social History Narrative  . Not on file   Immunization History  Administered Date(s) Administered  . Influenza,inj,Quad PF,6+ Mos 01/30/2017   Review of Systems  Constitutional: Negative.  Negative for malaise/fatigue.  HENT: Negative.   Eyes: Negative.  Negative for blurred vision, photophobia, redness and visual disturbance.  Respiratory: Positive for apnea. Negative for shortness of breath.   Cardiovascular: Negative.  Negative for chest pain, palpitations, orthopnea, leg swelling and PND.  Gastrointestinal: Negative.   Endocrine: Negative for polydipsia, polyphagia and polyuria.  Genitourinary: Negative.   Musculoskeletal: Negative.  Negative for back pain, gait problem, joint swelling, myalgias and neck pain.  Skin: Negative.   Neurological: Negative.  Negative for headaches.  Hematological: Negative.   Psychiatric/Behavioral: The patient is nervous/anxious. The patient is not hyperactive.        Objective:   Physical Exam  Constitutional: He is oriented to person, place, and time. He appears well-developed and well-nourished.  HENT:  Head: Normocephalic and atraumatic.  Right Ear: External ear normal.  Left Ear: External ear normal.  Nose: Nose normal.  Mouth/Throat: Oropharynx is clear and moist.  Eyes: Conjunctivae are normal. Pupils are equal, round, and reactive to light.  Neck: Normal range of motion.  Cardiovascular: Normal rate, normal heart sounds and normal pulses.  Pulmonary/Chest: Effort normal.  Abdominal: Soft. Bowel sounds are normal.  Neurological: He is alert and oriented to person, place, and time.      BP (!) 142/90   Pulse (!) 57   Temp 98.3 F (36.8 C) (Oral)   Resp 16   Ht 5\' 11"  (1.803 m)   Wt 252 lb (114.3 kg)   SpO2 99%   BMI 35.15 kg/m  Assessment & Plan:  1. Essential hypertension Blood pressure is slightly above  goal on current medication regimen, no medication changes will be make on today. Recommend a low sodium diet and increase water intake.  - atenolol (TENORMIN) 25 MG tablet; Take 1 tablet (25 mg total) by mouth 2 (two) times daily.  Dispense: 180 tablet; Refill: 1 - hydrALAZINE (APRESOLINE) 50 MG tablet; Take 1 tablet (50 mg total) by mouth every 8 (eight) hours.  Dispense: 90 tablet; Refill: 1 - losartan (COZAAR) 50 MG tablet; Take 1 tablet (50 mg total) by mouth daily.  Dispense: 30 tablet; Refill: 5  2. 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  Dispense: 60 tablet; Refill: 5  3. Generalized anxiety disorder GAD 7 : Generalized Anxiety Score 06/05/2017 10/31/2016 10/09/2016  Nervous, Anxious, on Edge 1 1 2   Control/stop worrying 0 1 2  Worry too much - different things 0 0 2  Trouble relaxing 0 0 1  Restless 0 0 1  Easily annoyed or irritable 1 1 1   Afraid - awful might happen 0 1 1  Total GAD 7 Score 2 4 10     - busPIRone (BUSPAR) 5 MG tablet; Take 1 tablet (5 mg total) by mouth 2 (two) times daily.  Dispense: 60 tablet; Refill: 5   RTC: 3 months for hypertension   [name]  MSN, FNP-C Patient [facility] [address] [place], [place] [address] [phone]

## 2017-06-13 ENCOUNTER — Ambulatory Visit
Admission: RE | Admit: 2017-06-13 | Discharge: 2017-06-13 | Disposition: A | Discharge status: Home / Self Care | Payer: Self-pay | Source: Ambulatory Visit | Attending: Nephrology | Admitting: Nephrology

## 2017-06-13 DIAGNOSIS — Q612 Polycystic kidney, adult type: Secondary | ICD-10-CM

## 2017-07-01 ENCOUNTER — Other Ambulatory Visit: Payer: Self-pay

## 2017-07-17 ENCOUNTER — Inpatient Hospital Stay: Admission: RE | Admit: 2017-07-17 | Payer: Self-pay | Source: Ambulatory Visit

## 2017-07-17 ENCOUNTER — Inpatient Hospital Stay
Admission: RE | Admit: 2017-07-17 | Discharge: 2017-07-17 | Disposition: A | Discharge status: Home / Self Care | Payer: Self-pay | Source: Ambulatory Visit | Attending: Nephrology | Admitting: Nephrology

## 2017-07-24 ENCOUNTER — Ambulatory Visit
Admission: RE | Admit: 2017-07-24 | Discharge: 2017-07-24 | Disposition: A | Discharge status: Home / Self Care | Payer: BLUE CROSS/BLUE SHIELD | Source: Ambulatory Visit | Attending: Nephrology | Admitting: Nephrology

## 2017-08-19 MED FILL — ATENOLOL 25 MG TABLET: 25 | 30 days supply | Qty: 60 | Fill #1

## 2017-08-19 MED FILL — hydrALAZINE HCL 50 MG TABS: 50 | 30 days supply | Qty: 90 | Fill #1

## 2017-08-19 MED FILL — ALLOPURINOL 100 MG TABS: 100 | 30 days supply | Qty: 60 | Fill #1

## 2017-08-19 MED FILL — LOSARTAN POTASSIUM 50 MG TA: 50 | 30 days supply | Qty: 30 | Fill #1

## 2017-08-20 MED FILL — busPIRone HCL 5 MG TABS: 5 | 30 days supply | Qty: 60 | Fill #5

## 2017-09-05 ENCOUNTER — Encounter: Payer: Self-pay | Admitting: Family Medicine

## 2017-09-05 ENCOUNTER — Ambulatory Visit (INDEPENDENT_AMBULATORY_CARE_PROVIDER_SITE_OTHER): Payer: BLUE CROSS/BLUE SHIELD | Admitting: Family Medicine

## 2017-09-05 VITALS — BP 166/94 | HR 68 | Temp 98.9°F | Resp 16 | Ht 71.0 in | Wt 249.0 lb

## 2017-09-05 DIAGNOSIS — G4730 Sleep apnea, unspecified: Secondary | ICD-10-CM | POA: Diagnosis not present

## 2017-09-05 DIAGNOSIS — I10 Essential (primary) hypertension: Secondary | ICD-10-CM

## 2017-09-05 DIAGNOSIS — N183 Chronic kidney disease, stage 3 unspecified: Secondary | ICD-10-CM

## 2017-09-05 DIAGNOSIS — F411 Generalized anxiety disorder: Secondary | ICD-10-CM

## 2017-09-05 DIAGNOSIS — M1A30X0 Chronic gout due to renal impairment, unspecified site, without tophus (tophi): Secondary | ICD-10-CM | POA: Diagnosis not present

## 2017-09-05 LAB — POCT URINALYSIS DIPSTICK
Bilirubin, UA: NEGATIVE
Glucose, UA: NEGATIVE
Ketones, UA: NEGATIVE
Leukocytes, UA: NEGATIVE
Nitrite, UA: NEGATIVE
Protein, UA: POSITIVE — AB
Spec Grav, UA: 1.015 (ref 1.010–1.025)
Urobilinogen, UA: 0.2 E.U./dL
pH, UA: 7 (ref 5.0–8.0)

## 2017-09-05 MED ORDER — ALLOPURINOL 100 MG PO TABS: 100.0000 mg | ORAL_TABLET | Freq: Two times a day (BID) | ORAL | 5 refills | Status: DC

## 2017-09-05 MED ORDER — HYDRALAZINE HCL 50 MG PO TABS: 50.0000 mg | ORAL_TABLET | Freq: Three times a day (TID) | ORAL | 1 refills | Status: DC

## 2017-09-05 MED ORDER — BUSPIRONE HCL 5 MG PO TABS: 5.0000 mg | ORAL_TABLET | Freq: Two times a day (BID) | ORAL | 5 refills | Status: DC

## 2017-09-05 MED ORDER — LOSARTAN POTASSIUM 50 MG PO TABS: 50.0000 mg | ORAL_TABLET | Freq: Every day | ORAL | 5 refills | Status: DC

## 2017-09-05 MED ORDER — ATENOLOL 25 MG PO TABS
25.0000 mg | ORAL_TABLET | Freq: Two times a day (BID) | ORAL | 1 refills | Status: DC
Start: 2017-09-05 — End: 2018-03-21

## 2017-09-05 NOTE — Patient Instructions (Signed)
Your blood pressure is above goal, I recommend that you return to office in 1 week for blood pressure check.  Please take all medications prior to arrival.  I recommend that you research for a CPAP options.  Sleep apnea.or is an option for you.   I will follow-up by phone with any abnormal laboratory results

## 2017-09-05 NOTE — Progress Notes (Signed)
Subjective:    Patient ID: Michael [name], male    DOB: [birth year], 42 y.o.   MRN: [id number]  Michael [name], a 42 year old male with a history of hypertension, gout,  and stage 3 CKD present for a follow up of hypertension. He says that he has been taking medications consistently and following a low fat, low sodium diet. He started an exercise regimen as well. He check blood pressures at home. He says that blood pressure has improved since he has been taking medications correctly. He denies dizziness, chest pain/pressure, heart palpitations, fatigue, edema, or syncope. Patient established care with [facility] several months ago for chronic kidney disease.   Patient reports insomnia. He says that he has been getting 2-3 hours of interrupted sleep. Patient has been unable to obtain CPAP equipment following sleep study due to financial constraints.     Hypertension  Pertinent negatives include no blurred vision, malaise/fatigue, orthopnea, palpitations, peripheral edema, PND, shortness of breath or sweats. There is no history of angina, kidney disease, CAD/MI, CVA, heart failure, left ventricular hypertrophy, PVD or retinopathy. Identifiable causes of hypertension include chronic renal disease.  Insomnia  Primary symptoms: no malaise/fatigue.  PMH includes: apnea.    Past Medical History:  Diagnosis Date  . Hypertension   . Polycystic kidney disease    Social History   Socioeconomic History  . Marital status: Single    Spouse name: Not on file  . Number of children: Not on file  . Years of education: Not on file  . Highest education level: Not on file  Occupational History  . Not on file  Social Needs  . Financial resource strain: Not on file  . Food insecurity:    Worry: Not on file    Inability: Not on file  . Transportation needs:    Medical: Not on file    Non-medical: Not on file  Tobacco Use  . Smoking status: Former Research scientist (life sciences)  . Smokeless tobacco: Never  Used  Substance and Sexual Activity  . Alcohol use: Yes    Comment: wine occ   . Drug use: No  . Sexual activity: Not on file  Lifestyle  . Physical activity:    Days per week: Not on file    Minutes per session: Not on file  . Stress: Not on file  Relationships  . Social connections:    Talks on phone: Not on file    Gets together: Not on file    Attends religious service: Not on file    Active member of club or organization: Not on file    Attends meetings of clubs or organizations: Not on file    Relationship status: Not on file  . Intimate partner violence:    Fear of current or ex partner: Not on file    Emotionally abused: Not on file    Physically abused: Not on file    Forced sexual activity: Not on file  Other Topics Concern  . Not on file  Social History Narrative  . Not on file   Immunization History  Administered Date(s) Administered  . Influenza,inj,Quad PF,6+ Mos 01/30/2017   Review of Systems  Constitutional: Negative.  Negative for malaise/fatigue.  HENT: Negative.   Eyes: Negative.  Negative for blurred vision, photophobia, redness and visual disturbance.  Respiratory: Positive for apnea. Negative for shortness of breath.   Cardiovascular: Negative.  Negative for palpitations, orthopnea, leg swelling and PND.  Gastrointestinal: Negative.   Endocrine:  Negative for polydipsia, polyphagia and polyuria.  Genitourinary: Negative.   Musculoskeletal: Negative.  Negative for back pain and gait problem.  Skin: Negative.   Neurological: Negative.   Hematological: Negative.   Psychiatric/Behavioral: The patient is nervous/anxious and has insomnia. The patient is not hyperactive.        Objective:   Physical Exam  Constitutional: He is oriented to person, place, and time. He appears well-developed and well-nourished.  HENT:  Head: Normocephalic and atraumatic.  Right Ear: External ear normal.  Left Ear: External ear normal.  Nose: Nose normal.   Mouth/Throat: Oropharynx is clear and moist.  Eyes: Pupils are equal, round, and reactive to light. Conjunctivae are normal.  Neck: Normal range of motion.  Cardiovascular: Normal rate, normal heart sounds and normal pulses.  Pulmonary/Chest: Effort normal.  Abdominal: Soft. Bowel sounds are normal.  Neurological: He is alert and oriented to person, place, and time.      BP (!) 166/94 (BP Location: Right Arm, Patient Position: Sitting, Cuff Size: Normal) Comment: MANUALLY  Pulse 68   Temp 98.9 F (37.2 C) (Oral)   Resp 16   Ht 5\' 11"  (1.803 m)   Wt 249 lb (112.9 kg)   SpO2 98%   BMI 34.73 kg/m  Assessment & Plan:  1. Essential hypertension Blood pressure is slightly above goal on current medication regimen. Patient did not take afternoon dose of hydralazine prior to arrival. No medication changes will be made on today. Advised patient to follow up in 1 week for bp check after taking medications consistently.  Recommend a low sodium diet and increase water intake.  - Urinalysis Dipstick - Basic Metabolic Panel - losartan (COZAAR) 50 MG tablet; Take 1 tablet (50 mg total) by mouth daily.  Dispense: 30 tablet; Refill: 5 - atenolol (TENORMIN) 25 MG tablet; Take 1 tablet (25 mg total) by mouth 2 (two) times daily.  Dispense: 180 tablet; Refill: 1 - hydrALAZINE (APRESOLINE) 50 MG tablet; Take 1 tablet (50 mg total) by mouth every 8 (eight) hours.  Dispense: 90 tablet; Refill: 1  2. CKD (chronic kidney disease) stage 3, GFR 30-59 ml/min (HCC) Continue to follow up with nephrology as scheduled.  - Basic Metabolic Panel  3. Observed sleep apnea Provided web site for financial assistance with CPAP equipment.   4. 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  Dispense: 60 tablet; Refill: 5  5. Generalized anxiety disorder - busPIRone (BUSPAR) 5 MG tablet; Take 1  tablet (5 mg total) by mouth 2 (two) times daily.  Dispense: 60 tablet; Refill: 5   RTC: 1 week for bp check and 3 months for hypertension   [name]  MSN, FNP-C [facility] [address] [place], [place] [address] [phone]

## 2017-09-06 LAB — BASIC METABOLIC PANEL
BUN/Creatinine Ratio: 14 (ref 9–20)
BUN: 33 mg/dL — ABNORMAL HIGH (ref 6–24)
CO2: 22 mmol/L (ref 20–29)
Calcium: 9.1 mg/dL (ref 8.7–10.2)
Chloride: 104 mmol/L (ref 96–106)
Creatinine, Ser: 2.36 mg/dL — ABNORMAL HIGH (ref 0.76–1.27)
GFR calc Af Amer: 38 mL/min/{1.73_m2} — ABNORMAL LOW (ref >59)
GFR calc non Af Amer: 33 mL/min/{1.73_m2} — ABNORMAL LOW (ref >59)
Glucose: 133 mg/dL — ABNORMAL HIGH (ref 65–99)
Potassium: 4.5 mmol/L (ref 3.5–5.2)
Sodium: 141 mmol/L (ref 134–144)

## 2017-10-16 MED FILL — LOSARTAN POTASSIUM 50 MG TA: 50 | 30 days supply | Qty: 30 | Fill #0

## 2017-10-16 MED FILL — ALLOPURINOL 100 MG TABLET: 100 | 30 days supply | Qty: 60 | Fill #2

## 2017-12-05 MED FILL — hydrALAZINE HCL 50 MG TABS: 50 | 30 days supply | Qty: 90 | Fill #0

## 2017-12-05 MED FILL — ALLOPURINOL 100 MG TABLET: 100 | 30 days supply | Qty: 60 | Fill #3

## 2017-12-05 MED FILL — LOSARTAN POTASSIUM 50 MG TA: 50 | 30 days supply | Qty: 30 | Fill #1

## 2017-12-06 ENCOUNTER — Ambulatory Visit: Payer: BLUE CROSS/BLUE SHIELD | Admitting: Family Medicine

## 2018-02-06 MED FILL — LOSARTAN POTASSIUM 50 MG TA: 50 | 30 days supply | Qty: 30 | Fill #2

## 2018-02-06 MED FILL — ALLOPURINOL 100 MG TABLET: 100 | 30 days supply | Qty: 60 | Fill #4

## 2018-02-06 MED FILL — hydrALAZINE HCL 50 MG TABS: 50 | 30 days supply | Qty: 90 | Fill #1

## 2018-02-17 ENCOUNTER — Ambulatory Visit: Payer: BLUE CROSS/BLUE SHIELD | Admitting: Family Medicine

## 2018-03-07 MED FILL — hydrALAZINE HCL 100 MG TABS: 100 | 30 days supply | Qty: 90 | Fill #0

## 2018-03-20 MED FILL — LOSARTAN POTASSIUM 50 MG TA: 50 | 30 days supply | Qty: 30 | Fill #3

## 2018-03-20 MED FILL — ALLOPURINOL 100 MG TABLET: 100 | 30 days supply | Qty: 60 | Fill #5

## 2018-03-21 ENCOUNTER — Telehealth: Payer: Self-pay | Admitting: Family Medicine

## 2018-03-21 ENCOUNTER — Encounter: Payer: Self-pay | Admitting: Family Medicine

## 2018-03-21 ENCOUNTER — Ambulatory Visit (INDEPENDENT_AMBULATORY_CARE_PROVIDER_SITE_OTHER): Payer: BLUE CROSS/BLUE SHIELD | Admitting: Family Medicine

## 2018-03-21 VITALS — BP 154/96 | HR 100 | Temp 98.2°F | Resp 16 | Ht 71.0 in | Wt 271.0 lb

## 2018-03-21 DIAGNOSIS — I10 Essential (primary) hypertension: Secondary | ICD-10-CM

## 2018-03-21 DIAGNOSIS — K921 Melena: Secondary | ICD-10-CM | POA: Diagnosis not present

## 2018-03-21 LAB — POCT URINALYSIS DIPSTICK
Bilirubin, UA: NEGATIVE
Glucose, UA: NEGATIVE
Ketones, UA: NEGATIVE
Nitrite, UA: NEGATIVE
Protein, UA: POSITIVE — AB
Spec Grav, UA: 1.015 (ref 1.010–1.025)
Urobilinogen, UA: 0.2 E.U./dL
pH, UA: 5.5 (ref 5.0–8.0)

## 2018-03-21 MED ORDER — ATENOLOL 25 MG PO TABS: 25.0000 mg | ORAL_TABLET | Freq: Two times a day (BID) | ORAL | 1 refills | Status: DC

## 2018-03-21 MED ORDER — LOSARTAN POTASSIUM 50 MG PO TABS: 50.0000 mg | ORAL_TABLET | Freq: Every day | ORAL | 5 refills | Status: DC

## 2018-03-21 MED ORDER — HYDRALAZINE HCL 50 MG PO TABS: 50.0000 mg | ORAL_TABLET | Freq: Three times a day (TID) | ORAL | 1 refills | Status: DC

## 2018-03-21 MED ORDER — ONE-A-DAY MENS PO TABS: 1.0000 | ORAL_TABLET | Freq: Every day | ORAL | 11 refills | Status: DC

## 2018-03-21 MED ORDER — DOCUSATE SODIUM 100 MG PO CAPS: 100.0000 mg | ORAL_CAPSULE | Freq: Two times a day (BID) | ORAL | 2 refills | Status: AC

## 2018-03-21 NOTE — Patient Instructions (Addendum)
The supplements that we discussed in the office visit: tumeric, glutamine, green tea without sugar.  Cut back on dairy products. If you continue to have bleeding in stools, please come back for an examination.   Healthy Eating to Prevent Digestive Disorders The digestive system starts at the mouth and goes all the way down to the rectum. Along the way, your digestive system breaks down the food you eat so you can absorb its nutrients and use them for energy. Digestive disorders can cause gas, bloating, pain, heartburn, and other symptoms. They can prevent your digestive system from doing its job. Healthy eating and a healthy lifestyle can help you avoid many common digestive disorders. What nutrition changes can be made? Start by eating a balanced diet. Eat healthy foods from all the major food groups. These include carbohydrates, fats, and proteins. Other changes you can make include to:  Eat enough fiber. Fiber is a healthy carbohydrate that cleans out your digestive system. Fiber absorbs water and helps you have regular bowel movements. Fiber comes from plants. To get enough fiber in your diet, eat 4-5 servings of fruits, vegetables, and legumes every day. Include beans and whole grains. Most people should get 20?35 grams of fiber each day.  Drink enough water to keep your urine clear or pale yellow. Water helps your body digest food. It can also help prevent constipation.  Avoid fatty proteins. Full-fat dairy products and fatty meats are hard to digest. Fats you want to avoid are those that get solid at room temperature (saturated fats). Instead of eating these kinds of fats, eat plant-based unsaturated fats found in olives, canola, corn, avocado, and nuts.  If you have trouble with gas, belching, or flatulence, avoid gas-producing foods. These include beans, carbonated beverages, cabbage, cauliflower, and broccoli. If you are lactose intolerant, avoid dairy products or choose lactose-free dairy  products.  If you have frequent heartburn, stay away from alcohol, caffeine, fatty foods, chocolate, and peppermint. Avoid lying down within two hours of eating a full meal. Overeating and lying down too soon after a meal can cause heartburn.  Add probiotics to your diet. Healthy digestion depends on having the right balance of good bacteria in your colon. Probiotics can help restore the balance of good bacteria in your digestive system. Probiotics are live active cultures that are found in yogurt, kefir, and cultured foods like sauerkraut and miso. You can also add good bacteria with probiotic supplements.  Make sure to chew your food slowly and completely.  Instead of eating three large meals each day, eat three small meals with three small snacks.  What other changes can I make? You can help your digestive system stay healthy by making these lifestyle changes:  Stay active and exercise every day.  Maintain a healthy weight.  Eat on a regular schedule.  Avoid tight-fitting clothes. They can restrict digestion.  If you have frequent heartburn, raise the head of your bed 2-3 inches (5-7.5 cm).  Do not use any tobacco products, such as cigarettes, chewing tobacco, and e-cigarettes. If you need help quitting, ask your health care provider.  Limit alcohol intake to no more than 1 drink a day for nonpregnant women and 2 drinks a day for men. One drink equals 12 oz of beer, 5 oz of wine, or 1 oz of hard liquor.  Avoid stress. Find ways to reduce stress, such as meditation, exercise, or taking time for activities that relax you. Why should I make these changes? Making these changes  will help your digestive system function at its best. A healthy digestive system can help you avoid or improve your management of digestive disorders such as:  Bloating, gas, and flatulence.  Heartburn.  Gastroesophageal reflux disease (GERD).  Peptic ulcer  disease.  Hemorrhoids.  Diverticulitis.  Constipation.  Diarrhea.  Gall stones  Irritable bowel syndrome.  Malnutrition.  Fatty liver disease. What can happen if changes are not made? Not making these changes could put you at risk for many conditions caused by a poor diet or an unhealthy weight, such as heart disease, stroke and diabetes. Where can I get more information? Learn more about healthy eating and digestive disorders by visiting these websites:  Academy of Nutrition and Dietetics: DenimDistribution.com.ee  Centers for Disease Control and Prevention: CoinSpecialists.co.za  U.S. Department of Health and Human Services: [place]CarWash.com.cy.pdf Summary  A heathy diet can help prevent many digestive disorders.  Eat a balanced diet consisting of fiber, unsaturated fats, lean protein, fruits, and vegetables.  Eat three small meals with three small snacks per day.  Drink plenty of water every day.  Get plenty of exercise and maintain a healthy weight. This information is not intended to replace advice given to you by your health care provider. Make sure you discuss any questions you have with your health care provider. Document Released: 04/01/2015 Document Revised: 08/11/2015 Document Reviewed: 11/16/2015 Elsevier Interactive Patient Education  2019 [employer].

## 2018-03-21 NOTE — Progress Notes (Signed)
[facility]   Progress Note: General Provider: Lanae Boast, FNP  SUBJECTIVE:   Michael [name] is a 43 y.o. male who  has a past medical history of Hypertension and Polycystic kidney disease.. Patient presents today for Hypertension; Blood In Stools; and Weight Loss Patient states that he has noticed blood in his stool x 1-2 weeks. Patient states that the blood is bright red. He states that he has to strain to have a bowel movement. Denies abdominal pain.  Patient states that he thinks that he has had a hx of hemorrhoids.  States that he has been having bloating with meals. States that he eats pizza, hamburgers and fast food.  He would like to have something to curb his appetite.  Patient is non-compliant with his HTN medications and is unsure of the ones that he takes.    Review of Systems  Constitutional: Negative.   HENT: Negative.   Eyes: Negative.   Respiratory: Negative.   Cardiovascular: Negative.   Gastrointestinal: Positive for blood in stool.  Genitourinary: Negative.   Musculoskeletal: Negative.   Skin: Negative.   Neurological: Negative.   Psychiatric/Behavioral: Negative.      OBJECTIVE: BP (!) 154/96 (BP Location: Left Arm, Patient Position: Sitting, Cuff Size: Normal) Comment: manually  Pulse 100   Temp 98.2 F (36.8 C) (Oral)   Resp 16   Ht 5\' 11"  (1.803 m)   Wt 271 lb (122.9 kg)   SpO2 98%   BMI 37.80 kg/m   Wt Readings from Last 3 Encounters:  03/21/18 271 lb (122.9 kg)  09/05/17 249 lb (112.9 kg)  06/05/17 252 lb (114.3 kg)     Physical Exam Vitals signs and nursing note reviewed.  Constitutional:      General: He is not in acute distress.    Appearance: He is well-developed.  HENT:     Head: Normocephalic and atraumatic.  Eyes:     Conjunctiva/sclera: Conjunctivae normal.     Pupils: Pupils are equal, round, and reactive to light.  Neck:     Musculoskeletal: Normal range of motion.    Cardiovascular:     Rate and Rhythm: Normal rate and regular rhythm.     Heart sounds: Normal heart sounds.  Pulmonary:     Effort: Pulmonary effort is normal. No respiratory distress.     Breath sounds: Normal breath sounds.  Abdominal:     General: Bowel sounds are normal. There is no distension.     Palpations: Abdomen is soft.  Genitourinary:    Comments: Patient refused examination Musculoskeletal: Normal range of motion.  Skin:    General: Skin is warm and dry.  Neurological:     Mental Status: He is alert and oriented to person, place, and time.  Psychiatric:        Mood and Affect: Mood normal.        Behavior: Behavior normal.        Thought Content: Thought content normal.        Judgment: Judgment normal.     ASSESSMENT/PLAN: Essential hypertension, benign - hydrALAZINE (APRESOLINE) 50 MG tablet; Take 1 tablet (50 mg total) by mouth every 8 (eight) hours.  Dispense: 90 tablet; Refill: 1 - losartan (COZAAR) 50 MG tablet; Take 1 tablet (50 mg total) by mouth daily.  Dispense: 30 tablet; Refill: 5 - atenolol (TENORMIN) 25 MG tablet; Take 1 tablet (25 mg total) by mouth 2 (two) times daily.  Dispense: 180 tablet; Refill: 1  Blood in stool Patient declines examination today. Discussed diet and exercise to control constipation.  - docusate sodium (COLACE) 100 MG capsule; Take 1 capsule (100 mg total) by mouth 2 (two) times daily.  Dispense: 60 capsule; Refill: 2  hemoglobin 13.9 Hematocrit 40.9 [facility] 03/16/2018 Advised patient to avoid fleet enemas, mag citrate and NSAID.   Return in about 3 months (around 06/20/2018), or 2 weeks BP check nurse, for HTN.   Time Spent: 25 minutes face-to-face with this patient discussing problems, treatments, and answering patient's questions.    The patient was given clear instructions to go to ER or return to medical center if symptoms do not improve, worsen or new problems develop. The patient verbalized understanding and  agreed with plan of care.   Ms. Doug Sou. Nathaneil Canary, FNP-BC [facility] [address] [place], [place] [address] [phone]

## 2018-03-25 NOTE — Telephone Encounter (Signed)
Called patient to advise him to not take the magnesium citrate, fleet enema or any medication for laxative effect due to his kidney function. Also advised patient to discuss any supplements that he wants to use with nephrologist due to polycystic kidneys.   The patient verbalized understanding and agreed with plan of care.

## 2018-04-02 ENCOUNTER — Encounter: Payer: Self-pay | Admitting: Skilled Nursing Facility1

## 2018-04-02 ENCOUNTER — Encounter: Payer: BLUE CROSS/BLUE SHIELD | Attending: Nephrology | Admitting: Skilled Nursing Facility1

## 2018-04-02 DIAGNOSIS — N183 Chronic kidney disease, stage 3 unspecified: Secondary | ICD-10-CM

## 2018-04-02 NOTE — Progress Notes (Signed)
  Assessment:  Primary concerns today: hypertension.   Pt states recently has been not wanting to do things he used to do like washing his cars. Pt states every time he eats pizza stating cheese causes constipation stating he normally has a bowel movement every day but biscuits causes constipation. Pt states he started a multivitamin last week and has been feeling a little more energy. Pt states he is going through a lot of stress. Pt states he wants to be 225 pounds.   Pt states he wants to have a calender for checks and X's  Pt states he has to be at work at 6:30am.   MEDICATIONS: See List   DIETARY INTAKE:  Usual eating pattern includes 3 meals and 0 snacks per day.  Everyday foods include none stated.  Avoided foods include none stated.    24-hr recall: Eating out most meals B ( AM): 2 fast food bisuits Snk ( AM): L (12 PM): chicken wings or sub or pizza Snk ( PM):  D (4 PM): hot fog and chips Snk ( PM):  Beverages: water: 100-118 ounces   Usual physical activity: ADL's  Estimated energy needs: 2000 calories 225 g carbohydrates 150 g protein 56 g fat  Progress Towards Goal(s):  In progress.   Nutritional Diagnosis:  [place]-3.3 Overweight/obesity As related to overconsumption of fast food.  As evidenced by pt report, 24 hr recall, BMI 37.    Intervention:  Nutrition cousneling. Dietitian educated the pt on a caloric defecit ot have desired weight loss and a balanced diet to control hypertension. Goals: 2000 calories Read your nutrition labels Identify stress relief mechanisms identify when you are about to eat due to hunger verses stress/depression  Follow the meal ideas sheet to put together balanced meals  Use measuring cups to measure out your servings  Avoid drinking regular Powerade unless you have been sweating for 1 hour or more, Powerade zero is always an option as well  Teaching Method Utilized:  Visual Auditory Hands on  Handouts given during visit  include:  Meal ideas  Barriers to learning/adherence to lifestyle change: emotional eater  Demonstrated degree of understanding via:  Teach Back   Monitoring/Evaluation:  Dietary intake, exercise, and body weight prn.

## 2018-04-02 NOTE — Patient Instructions (Signed)
-  Avoid drinking regular Powerade unless you have been sweating for 1 hour or more, Powerade zero is always an option as well  -

## 2018-05-07 MED FILL — hydrALAZINE HCL 50 MG TABS: 50 | 30 days supply | Qty: 90 | Fill #0

## 2018-05-07 MED FILL — ALLOPURINOL 100 MG TABLET: 100 | 30 days supply | Qty: 60 | Fill #0

## 2018-05-07 MED FILL — ATENOLOL 25 MG TABLET: 25 | 90 days supply | Qty: 180 | Fill #0

## 2018-05-08 MED FILL — LOSARTAN POTASSIUM 50 MG TA: 50 | 30 days supply | Qty: 30 | Fill #4

## 2018-06-19 ENCOUNTER — Ambulatory Visit: Payer: BLUE CROSS/BLUE SHIELD | Admitting: Family Medicine

## 2018-07-10 MED FILL — LOSARTAN POTASSIUM 50 MG TA: 50 | 30 days supply | Qty: 30 | Fill #5

## 2018-07-10 MED FILL — ALLOPURINOL 100 MG TABLET: 100 | 30 days supply | Qty: 60 | Fill #1

## 2018-07-10 MED FILL — hydrALAZINE HCL 50 MG TABS: 50 | 30 days supply | Qty: 90 | Fill #1

## 2018-07-29 DIAGNOSIS — G4730 Sleep apnea, unspecified: Secondary | ICD-10-CM

## 2018-07-29 HISTORY — DX: Sleep apnea, unspecified: G47.30

## 2018-08-01 ENCOUNTER — Ambulatory Visit (INDEPENDENT_AMBULATORY_CARE_PROVIDER_SITE_OTHER): Payer: BLUE CROSS/BLUE SHIELD | Admitting: Family Medicine

## 2018-08-01 ENCOUNTER — Other Ambulatory Visit: Payer: Self-pay

## 2018-08-01 ENCOUNTER — Encounter: Payer: Self-pay | Admitting: Family Medicine

## 2018-08-01 DIAGNOSIS — M1A30X0 Chronic gout due to renal impairment, unspecified site, without tophus (tophi): Secondary | ICD-10-CM

## 2018-08-01 DIAGNOSIS — I10 Essential (primary) hypertension: Secondary | ICD-10-CM | POA: Diagnosis not present

## 2018-08-01 MED ORDER — LOSARTAN POTASSIUM 50 MG PO TABS: 50.0000 mg | ORAL_TABLET | Freq: Every day | ORAL | 5 refills | Status: DC

## 2018-08-01 MED ORDER — HYDRALAZINE HCL 50 MG PO TABS: 50.0000 mg | ORAL_TABLET | Freq: Three times a day (TID) | ORAL | 1 refills | Status: DC

## 2018-08-01 MED ORDER — ALLOPURINOL 100 MG PO TABS: 100.0000 mg | ORAL_TABLET | Freq: Two times a day (BID) | ORAL | 5 refills | Status: DC

## 2018-08-01 MED ORDER — ATENOLOL 25 MG PO TABS: 25.0000 mg | ORAL_TABLET | Freq: Two times a day (BID) | ORAL | 1 refills | Status: DC

## 2018-08-01 MED FILL — ATENOLOL 25 MG TABLET: 25 | 90 days supply | Qty: 180 | Fill #0

## 2018-08-01 NOTE — Progress Notes (Signed)
  [facility]  Virtual Visit via Telephone Note  I connected with Michael [name] on 08/01/18 at 10:40 AM EDT by telephone and verified that I am speaking with the correct person using two identifiers.   I discussed the limitations, risks, security and privacy concerns of performing an evaluation and management service by telephone and the availability of in person appointments. I also discussed with the patient that there may be a patient responsible charge related to this service. The patient expressed understanding and agreed to proceed.   History of Present Illness: Michael [name]  has a past medical history of Hypertension and Polycystic kidney disease. Since his last visit, patient was seen nutritionist for help with CKD. He states that he received his CPAP and is not using it regularly as he is having difficulty falling asleep with the mask on his face. He states that he continues to exercise with walking but has not made his goal weight as of yet. He is doing well and denies CP, SOB, dizziness or leg swelling.    Observations/Objective: Patient with regular voice tone, rate and rhythm. Speaking calmly and is in no apparent distress.    Assessment and Plan: 1. Essential hypertension, benign - hydrALAZINE (APRESOLINE) 50 MG tablet; Take 1 tablet (50 mg total) by mouth every 8 (eight) hours.  Dispense: 90 tablet; Refill: 1 - losartan (COZAAR) 50 MG tablet; Take 1 tablet (50 mg total) by mouth daily.  Dispense: 30 tablet; Refill: 5 - atenolol (TENORMIN) 25 MG tablet; Take 1 tablet (25 mg total) by mouth 2 (two) times daily.  Dispense: 180 tablet; Refill: 1  2. 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  Dispense: 60 tablet; Refill: 5  No medication changes warranted at the present time.    Follow Up Instructions:   We discussed hand washing, using hand sanitizer when soap and water are not available, only going out when absolutely necessary, and social distancing. Explained to patient that he is immunocompromised and will need to take precautions during this time.   I discussed the assessment and treatment plan with the patient. The patient was provided an opportunity to ask questions and all were answered. The patient agreed with the plan and demonstrated an understanding of the instructions.   The patient was advised to call back or seek an in-person evaluation if the symptoms worsen or if the condition fails to improve as anticipated.  I provided 10 minutes of non-face-to-face time during this encounter.  Ms. Andr L. Nathaneil Canary, FNP-BC [facility] [address] [place], [place] [address] [phone]

## 2018-09-03 MED FILL — LOSARTAN POTASSIUM 50 MG TA: 50 | 30 days supply | Qty: 30 | Fill #0

## 2018-10-20 ENCOUNTER — Ambulatory Visit: Payer: BLUE CROSS/BLUE SHIELD | Admitting: Family Medicine

## 2018-10-27 MED FILL — ALLOPURINOL 100 MG TABLET: 100 | 30 days supply | Qty: 60 | Fill #0

## 2018-10-27 MED FILL — LOSARTAN POTASSIUM 50 MG TA: 50 | 30 days supply | Qty: 30 | Fill #1

## 2018-11-18 DIAGNOSIS — E559 Vitamin D deficiency, unspecified: Secondary | ICD-10-CM

## 2018-11-18 HISTORY — DX: Vitamin D deficiency, unspecified: E55.9

## 2018-11-26 ENCOUNTER — Encounter (HOSPITAL_COMMUNITY): Payer: Self-pay

## 2018-11-26 ENCOUNTER — Encounter (HOSPITAL_COMMUNITY): Payer: Self-pay | Admitting: *Deleted

## 2018-12-04 MED FILL — hydrALAZINE HCL 50 MG TABS: 50 | 30 days supply | Qty: 90 | Fill #0

## 2018-12-04 MED FILL — LOSARTAN POTASSIUM 50 MG TA: 50 | 30 days supply | Qty: 30 | Fill #2

## 2018-12-04 MED FILL — ALLOPURINOL 100 MG TABLET: 100 | 30 days supply | Qty: 60 | Fill #1

## 2018-12-12 ENCOUNTER — Encounter: Payer: Self-pay | Admitting: Family Medicine

## 2018-12-12 ENCOUNTER — Ambulatory Visit (HOSPITAL_COMMUNITY)
Admission: RE | Admit: 2018-12-12 | Discharge: 2018-12-12 | Disposition: A | Discharge status: Home / Self Care | Payer: BC Managed Care – PPO | Source: Ambulatory Visit | Attending: Family Medicine | Admitting: Family Medicine

## 2018-12-12 ENCOUNTER — Other Ambulatory Visit: Payer: Self-pay

## 2018-12-12 ENCOUNTER — Ambulatory Visit (INDEPENDENT_AMBULATORY_CARE_PROVIDER_SITE_OTHER): Payer: BC Managed Care – PPO | Admitting: Family Medicine

## 2018-12-12 VITALS — BP 198/82 | HR 87 | Temp 98.6°F | Ht 71.0 in | Wt 278.8 lb

## 2018-12-12 DIAGNOSIS — R310 Gross hematuria: Secondary | ICD-10-CM

## 2018-12-12 DIAGNOSIS — Z87448 Personal history of other diseases of urinary system: Secondary | ICD-10-CM | POA: Diagnosis not present

## 2018-12-12 DIAGNOSIS — I10 Essential (primary) hypertension: Secondary | ICD-10-CM

## 2018-12-12 DIAGNOSIS — Q612 Polycystic kidney, adult type: Secondary | ICD-10-CM

## 2018-12-12 DIAGNOSIS — N281 Cyst of kidney, acquired: Secondary | ICD-10-CM | POA: Diagnosis not present

## 2018-12-12 DIAGNOSIS — M1A30X0 Chronic gout due to renal impairment, unspecified site, without tophus (tophi): Secondary | ICD-10-CM

## 2018-12-12 DIAGNOSIS — Z09 Encounter for follow-up examination after completed treatment for conditions other than malignant neoplasm: Secondary | ICD-10-CM

## 2018-12-12 DIAGNOSIS — R8290 Unspecified abnormal findings in urine: Secondary | ICD-10-CM

## 2018-12-12 LAB — POCT URINALYSIS DIPSTICK
Bilirubin, UA: NEGATIVE
Glucose, UA: NEGATIVE
Ketones, UA: NEGATIVE
Nitrite, UA: NEGATIVE
Protein, UA: POSITIVE — AB
Spec Grav, UA: 1.02 (ref 1.010–1.025)
Urobilinogen, UA: 0.2 E.U./dL
pH, UA: 6 (ref 5.0–8.0)

## 2018-12-12 MED ORDER — ALLOPURINOL 100 MG PO TABS: 100.0000 mg | ORAL_TABLET | Freq: Two times a day (BID) | ORAL | 6 refills | Status: DC

## 2018-12-12 MED ORDER — LOSARTAN POTASSIUM 50 MG PO TABS: 50.0000 mg | ORAL_TABLET | Freq: Every day | ORAL | 6 refills | Status: DC

## 2018-12-12 MED ORDER — HYDRALAZINE HCL 50 MG PO TABS: 50.0000 mg | ORAL_TABLET | Freq: Three times a day (TID) | ORAL | 6 refills | Status: DC

## 2018-12-12 NOTE — Progress Notes (Signed)
Patient [facility]  Established Patient Office Visit  Subjective:  Patient ID: Michael [name], male    DOB: 1975/08/29  Age: 43 y.o. MRN: [id number]  CC:  Chief Complaint  Patient presents with  . Follow-up    blood in stool and urine notice about 3 days ago , back pain , some stomach pain     HPI Michael [name] is a 43 year old male who presents for Follow Up today.   Past Medical History:  Diagnosis Date  . Hypertension   . Polycystic kidney disease   . Sleep apnea    Current Status: Michael [name] is a previous patient of Lanae Boast, NP. Since his last office visit, he is doing well with no complaints. He has had rectal and rectal bleeding X 3 days now. He has history of Polycystic Kidney Disease and has had these incidents occasional, but he has additional symptoms like 'fulness in abdomen', uncomfortable sleeping, and her reports increased blood in urine. His last visit to [facility] was in 6 months. He denies visual changes, chest pain, cough, shortness of breath, heart palpitations, and falls. He has occasional headaches and dizziness with position changes. Denies severe headaches, confusion, seizures, double vision, and blurred vision, nausea and vomiting. He denies fevers, chills, fatigue, recent infections, weight loss, and night sweats. No reports of GI problems such as nausea, vomiting, diarrhea, and constipation. He has no reports of blood in stools, and dysuria. No is very anxious today, concerning his his present health status. He denies suicidal ideations, homicidal ideations, or auditory hallucinations. He denies pain today.   No past surgical history on file.  Family History  Problem Relation Age of Onset  . Bipolar disorder Father     Social History   Socioeconomic History  . Marital status: Single    Spouse name: Not on file  . Number of children: Not on file  . Years of education: Not on file  . Highest education  level: Not on file  Occupational History  . Not on file  Social Needs  . Financial resource strain: Not on file  . Food insecurity    Worry: Not on file    Inability: Not on file  . Transportation needs    Medical: Not on file    Non-medical: Not on file  Tobacco Use  . Smoking status: Former Research scientist (life sciences)  . Smokeless tobacco: Never Used  Substance and Sexual Activity  . Alcohol use: Yes    Comment: wine occ   . Drug use: No  . Sexual activity: Not on file  Lifestyle  . Physical activity    Days per week: Not on file    Minutes per session: Not on file  . Stress: Not on file  Relationships  . Social Herbalist on phone: Not on file    Gets together: Not on file    Attends religious service: Not on file    Active member of club or organization: Not on file    Attends meetings of clubs or organizations: Not on file    Relationship status: Not on file  . Intimate partner violence    Fear of current or ex partner: Not on file    Emotionally abused: Not on file    Physically abused: Not on file    Forced sexual activity: Not on file  Other Topics Concern  . Not on file  Social History Narrative  .  Not on file    Outpatient Medications Prior to Visit  Medication Sig Dispense Refill  . atenolol (TENORMIN) 25 MG tablet Take 1 tablet (25 mg total) by mouth 2 (two) times daily. 180 tablet 1  . allopurinol (ZYLOPRIM) 100 MG tablet Take 1 tablet (100 mg total) by mouth 2 (two) times daily. 60 tablet 5  . hydrALAZINE (APRESOLINE) 50 MG tablet Take 1 tablet (50 mg total) by mouth every 8 (eight) hours. 90 tablet 1  . losartan (COZAAR) 50 MG tablet Take 1 tablet (50 mg total) by mouth daily. 30 tablet 5  . multivitamin (ONE-A-DAY MEN'S) TABS tablet Take 1 tablet by mouth daily. (Patient not taking: Reported on 12/12/2018) 30 tablet 11   No facility-administered medications prior to visit.     Allergies  Allergen Reactions  . Shellfish Allergy Anaphylaxis    ROS Review  of Systems  Constitutional: Negative.   HENT: Negative.   Eyes: Negative.   Respiratory: Negative.   Cardiovascular: Negative.   Gastrointestinal: Positive for abdominal pain, anal bleeding and blood in stool.  Endocrine: Negative.   Genitourinary: Positive for hematuria.  Musculoskeletal: Negative.   Skin: Negative.   Allergic/Immunologic: Negative.   Neurological: Negative.   Hematological: Negative.   Psychiatric/Behavioral: The patient is nervous/anxious.       Objective:    Physical Exam  Constitutional: He is oriented to person, place, and time. He appears well-developed and well-nourished.  HENT:  Head: Normocephalic and atraumatic.  Eyes: Conjunctivae are normal.  Neck: Normal range of motion. Neck supple.  Cardiovascular: Normal rate, regular rhythm, normal heart sounds and intact distal pulses.  Pulmonary/Chest: Effort normal and breath sounds normal.  Abdominal: Soft. Bowel sounds are normal. He exhibits distension (obese).  Musculoskeletal: Normal range of motion.  Neurological: He is alert and oriented to person, place, and time. He has normal reflexes.  Skin: Skin is warm and dry.  Psychiatric: He has a normal mood and affect. His behavior is normal. Judgment and thought content normal.  Nursing note and vitals reviewed.   BP (!) 198/82 (BP Location: Left Arm, Patient Position: Sitting, Cuff Size: Large)   Pulse 87   Temp 98.6 F (37 C) (Oral)   Ht 5\' 11"  (1.803 m)   Wt 278 lb 12.8 oz (126.5 kg)   SpO2 100%   BMI 38.88 kg/m  Wt Readings from Last 3 Encounters:  12/12/18 278 lb 12.8 oz (126.5 kg)  04/02/18 271 lb (122.9 kg)  03/21/18 271 lb (122.9 kg)     There are no preventive care reminders to display for this patient.  There are no preventive care reminders to display for this patient.  Lab Results  Component Value Date   TSH 2.730 12/12/2018   Lab Results  Component Value Date   WBC 8.7 12/12/2018   HGB 12.5 (L) 12/12/2018   HCT 38.1  12/12/2018   MCV 84 12/12/2018   PLT 274 12/12/2018   Lab Results  Component Value Date   NA 139 12/12/2018   K 5.4 (H) 12/12/2018   CO2 17 (L) 12/12/2018   GLUCOSE 96 12/12/2018   BUN 39 (H) 12/12/2018   CREATININE 3.12 (H) 12/12/2018   BILITOT 0.2 12/12/2018   ALKPHOS 63 12/12/2018   AST 19 12/12/2018   ALT 21 12/12/2018   PROT 7.1 12/12/2018   ALBUMIN 4.0 12/12/2018   CALCIUM 9.4 12/12/2018   ANIONGAP 9 05/25/2016   Lab Results  Component Value Date   CHOL 221 (H) 12/12/2018  Lab Results  Component Value Date   HDL 35 (L) 12/12/2018   Lab Results  Component Value Date   [facility]  12/06/2009    80        Total Cholesterol/HDL:CHD Risk Coronary Heart Disease Risk Table                     Men   Women  1/2 Average Risk   3.4   3.3  Average Risk       5.0   4.4  2 X Average Risk   9.6   7.1  3 X Average Risk  23.4   11.0        Use the calculated Patient Ratio above and the CHD Risk Table to determine the patient's CHD Risk.        ATP III CLASSIFICATION (LDL):  <100     mg/dL   Optimal  100-129  mg/dL   Near or Above                    Optimal  130-159  mg/dL   Borderline  160-189  mg/dL   High  >190     mg/dL   Very High   Lab Results  Component Value Date   TRIG 263 (H) 12/12/2018   Lab Results  Component Value Date   CHOLHDL 6.3 (H) 12/12/2018   Lab Results  Component Value Date   HGBA1C 5.7 06/19/2016    Assessment & Plan:   1. Gross hematuria Observed in urine. Urine culture is pending.  - CT Abdomen Pelvis Wo Contrast; Future  2. History of blood in urine - POCT Urinalysis Dipstick  3. Adult polycystic kidney disease - CT Abdomen Pelvis Wo Contrast; Future  4. Acquired cyst of kidney  5. Essential hypertension He will continue to decrease high sodium intake, excessive alcohol intake, increase potassium intake, smoking cessation, and increase physical activity of at least 30 minutes of cardio activity daily. She will continue to  follow Heart Healthy or DASH diet. - CBC with Differential - Comprehensive metabolic panel - Lipid Panel - TSH - Vitamin B12 - Vitamin D, 25-hydroxy  6. Essential hypertension, benign Blood pressure in elevated today, because of stress r/t health concerns.  - hydrALAZINE (APRESOLINE) 50 MG tablet; Take 1 tablet (50 mg total) by mouth every 8 (eight) hours.  Dispense: 90 tablet; Refill: 6 - losartan (COZAAR) 50 MG tablet; Take 1 tablet (50 mg total) by mouth daily.  Dispense: 30 tablet; Refill: 6  7. Chronic gout due to renal impairment without tophus, unspecified site Stable. No reports of recurrence lately.  - allopurinol (ZYLOPRIM) 100 MG tablet; Take 1 tablet (100 mg total) by mouth 2 (two) times daily.  Dispense: 60 tablet; Refill: 6  8. Abnormal urinalysis Results are pending.  - Urine Culture  9. Follow up He will follow up in 2 months.   Meds ordered this encounter  Medications  . allopurinol (ZYLOPRIM) 100 MG tablet    Sig: Take 1 tablet (100 mg total) by mouth 2 (two) times daily.    Dispense:  60 tablet    Refill:  6  . hydrALAZINE (APRESOLINE) 50 MG tablet    Sig: Take 1 tablet (50 mg total) by mouth every 8 (eight) hours.    Dispense:  90 tablet    Refill:  6  . losartan (COZAAR) 50 MG tablet    Sig: Take 1 tablet (50 mg total) by mouth daily.  Dispense:  30 tablet    Refill:  6    Orders Placed This Encounter  Procedures  . Urine Culture  . CT Abdomen Pelvis Wo Contrast  . CBC with Differential  . Comprehensive metabolic panel  . Lipid Panel  . TSH  . Vitamin B12  . Vitamin D, 25-hydroxy  . POCT Urinalysis Dipstick    Referral Orders  No referral(s) requested today    Kathe Becton,  MSN, FNP-BC [address], [place] [address] [phone]- fax  Problem List Items Addressed This Visit      Cardiovascular and Mediastinum   Essential  hypertension, benign   Relevant Medications   hydrALAZINE (APRESOLINE) 50 MG tablet   losartan (COZAAR) 50 MG tablet   Hypertension   Relevant Medications   hydrALAZINE (APRESOLINE) 50 MG tablet   losartan (COZAAR) 50 MG tablet   Other Relevant Orders   CBC with Differential (Completed)   Comprehensive metabolic panel (Completed)   Lipid Panel (Completed)   TSH (Completed)   Vitamin B12 (Completed)   Vitamin D, 25-hydroxy (Completed)     Genitourinary   Adult polycystic kidney disease   Relevant Orders   CT Abdomen Pelvis Wo Contrast (Completed)     Other   Hematuria - Primary   Relevant Orders   CT Abdomen Pelvis Wo Contrast (Completed)    Other Visit Diagnoses    History of blood in urine       Relevant Orders   POCT Urinalysis Dipstick (Completed)   Acquired cyst of kidney       Chronic gout due to renal impairment without tophus, unspecified site       Relevant Medications   allopurinol (ZYLOPRIM) 100 MG tablet   Abnormal urinalysis       Relevant Orders   Urine Culture (Completed)   Follow up          Meds ordered this encounter  Medications  . allopurinol (ZYLOPRIM) 100 MG tablet    Sig: Take 1 tablet (100 mg total) by mouth 2 (two) times daily.    Dispense:  60 tablet    Refill:  6  . hydrALAZINE (APRESOLINE) 50 MG tablet    Sig: Take 1 tablet (50 mg total) by mouth every 8 (eight) hours.    Dispense:  90 tablet    Refill:  6  . losartan (COZAAR) 50 MG tablet    Sig: Take 1 tablet (50 mg total) by mouth daily.    Dispense:  30 tablet    Refill:  6    Follow-up: Return in about 2 months (around 02/11/2019).    Azzie Glatter, FNP

## 2018-12-13 LAB — CBC WITH DIFFERENTIAL/PLATELET
Basophils Absolute: 0.1 10*3/uL (ref 0.0–0.2)
Basos: 1 %
EOS (ABSOLUTE): 0.5 10*3/uL — ABNORMAL HIGH (ref 0.0–0.4)
Eos: 6 %
Hematocrit: 38.1 % (ref 37.5–51.0)
Hemoglobin: 12.5 g/dL — ABNORMAL LOW (ref 13.0–17.7)
Immature Grans (Abs): 0.1 10*3/uL (ref 0.0–0.1)
Immature Granulocytes: 1 %
Lymphocytes Absolute: 2.4 10*3/uL (ref 0.7–3.1)
Lymphs: 27 %
MCH: 27.5 pg (ref 26.6–33.0)
MCHC: 32.8 g/dL (ref 31.5–35.7)
MCV: 84 fL (ref 79–97)
Monocytes Absolute: 0.7 10*3/uL (ref 0.1–0.9)
Monocytes: 8 %
Neutrophils Absolute: 5 10*3/uL (ref 1.4–7.0)
Neutrophils: 57 %
Platelets: 274 10*3/uL (ref 150–450)
RBC: 4.55 x10E6/uL (ref 4.14–5.80)
RDW: 14.3 % (ref 11.6–15.4)
WBC: 8.7 10*3/uL (ref 3.4–10.8)

## 2018-12-13 LAB — COMPREHENSIVE METABOLIC PANEL
ALT: 21 IU/L (ref 0–44)
AST: 19 IU/L (ref 0–40)
Albumin/Globulin Ratio: 1.3 (ref 1.2–2.2)
Albumin: 4 g/dL (ref 4.0–5.0)
Alkaline Phosphatase: 63 IU/L (ref 39–117)
BUN/Creatinine Ratio: 13 (ref 9–20)
BUN: 39 mg/dL — ABNORMAL HIGH (ref 6–24)
Bilirubin Total: 0.2 mg/dL (ref 0.0–1.2)
CO2: 17 mmol/L — ABNORMAL LOW (ref 20–29)
Calcium: 9.4 mg/dL (ref 8.7–10.2)
Chloride: 107 mmol/L — ABNORMAL HIGH (ref 96–106)
Creatinine, Ser: 3.12 mg/dL — ABNORMAL HIGH (ref 0.76–1.27)
GFR calc Af Amer: 27 mL/min/{1.73_m2} — ABNORMAL LOW (ref >59)
GFR calc non Af Amer: 23 mL/min/{1.73_m2} — ABNORMAL LOW (ref >59)
Globulin, Total: 3.1 g/dL (ref 1.5–4.5)
Glucose: 96 mg/dL (ref 65–99)
Potassium: 5.4 mmol/L — ABNORMAL HIGH (ref 3.5–5.2)
Sodium: 139 mmol/L (ref 134–144)
Total Protein: 7.1 g/dL (ref 6.0–8.5)

## 2018-12-13 LAB — LIPID PANEL
Chol/HDL Ratio: 6.3 ratio — ABNORMAL HIGH (ref 0.0–5.0)
Cholesterol, Total: 221 mg/dL — ABNORMAL HIGH (ref 100–199)
HDL: 35 mg/dL — ABNORMAL LOW (ref >39)
LDL Chol Calc (NIH): 138 mg/dL — ABNORMAL HIGH (ref 0–99)
Triglycerides: 263 mg/dL — ABNORMAL HIGH (ref 0–149)
VLDL Cholesterol Cal: 48 mg/dL — ABNORMAL HIGH (ref 5–40)

## 2018-12-13 LAB — TSH: TSH: 2.73 u[IU]/mL (ref 0.450–4.500)

## 2018-12-13 LAB — VITAMIN D 25 HYDROXY (VIT D DEFICIENCY, FRACTURES): Vit D, 25-Hydroxy: 16 ng/mL — ABNORMAL LOW (ref 30.0–100.0)

## 2018-12-13 LAB — VITAMIN B12: Vitamin B-12: 490 pg/mL (ref 232–1245)

## 2018-12-14 DIAGNOSIS — M1A30X0 Chronic gout due to renal impairment, unspecified site, without tophus (tophi): Secondary | ICD-10-CM | POA: Insufficient documentation

## 2018-12-14 LAB — URINE CULTURE: Organism ID, Bacteria: NO GROWTH

## 2018-12-16 ENCOUNTER — Other Ambulatory Visit: Payer: Self-pay

## 2018-12-16 ENCOUNTER — Encounter: Payer: Self-pay | Admitting: Family Medicine

## 2018-12-16 ENCOUNTER — Other Ambulatory Visit: Payer: Self-pay | Admitting: Family Medicine

## 2018-12-16 DIAGNOSIS — E559 Vitamin D deficiency, unspecified: Secondary | ICD-10-CM

## 2018-12-16 MED ORDER — VITAMIN D (ERGOCALCIFEROL) 1.25 MG (50000 UNIT) PO CAPS: 50000.0000 [IU] | ORAL_CAPSULE | ORAL | 3 refills | Status: DC

## 2019-01-12 ENCOUNTER — Ambulatory Visit: Payer: BC Managed Care – PPO | Admitting: Family Medicine

## 2019-01-23 ENCOUNTER — Ambulatory Visit: Payer: BC Managed Care – PPO | Admitting: Family Medicine

## 2019-01-27 MED FILL — hydrALAZINE HCL 50 MG TABS: 50 | 30 days supply | Qty: 90 | Fill #1

## 2019-01-27 MED FILL — ALLOPURINOL 100 MG TABLET: 100 | 30 days supply | Qty: 60 | Fill #2

## 2019-01-27 MED FILL — LOSARTAN POTASSIUM 50 MG TA: 50 | 30 days supply | Qty: 30 | Fill #3

## 2019-02-20 ENCOUNTER — Telehealth: Payer: Self-pay | Admitting: Family Medicine

## 2019-02-20 NOTE — Telephone Encounter (Signed)
Called, spoke with patient, he states he is having a flare up of gout and needs to be seen. I advised he we do not have any openings until next week and I would suggest him going to an Urgent care to be evaluated and to get some relief asap. Patient verbalized understanding. Thanks!

## 2019-02-24 ENCOUNTER — Ambulatory Visit (INDEPENDENT_AMBULATORY_CARE_PROVIDER_SITE_OTHER): Payer: BC Managed Care – PPO | Admitting: Family Medicine

## 2019-02-24 ENCOUNTER — Encounter: Payer: Self-pay | Admitting: Family Medicine

## 2019-02-24 ENCOUNTER — Other Ambulatory Visit: Payer: Self-pay

## 2019-02-24 VITALS — BP 149/87 | HR 73 | Temp 98.5°F | Ht 71.0 in | Wt 274.2 lb

## 2019-02-24 DIAGNOSIS — N1832 Chronic kidney disease, stage 3b: Secondary | ICD-10-CM | POA: Diagnosis not present

## 2019-02-24 DIAGNOSIS — M1A30X0 Chronic gout due to renal impairment, unspecified site, without tophus (tophi): Secondary | ICD-10-CM

## 2019-02-24 DIAGNOSIS — M10371 Gout due to renal impairment, right ankle and foot: Secondary | ICD-10-CM | POA: Diagnosis not present

## 2019-02-24 DIAGNOSIS — I10 Essential (primary) hypertension: Secondary | ICD-10-CM | POA: Diagnosis not present

## 2019-02-24 LAB — POCT URINALYSIS DIPSTICK
Bilirubin, UA: NEGATIVE
Glucose, UA: NEGATIVE
Ketones, UA: NEGATIVE
Nitrite, UA: NEGATIVE
Protein, UA: POSITIVE — AB
Spec Grav, UA: 1.025 (ref 1.010–1.025)
Urobilinogen, UA: 0.2 E.U./dL
pH, UA: 5.5 (ref 5.0–8.0)

## 2019-02-24 MED ORDER — ALLOPURINOL 100 MG PO TABS: 100.0000 mg | ORAL_TABLET | Freq: Two times a day (BID) | ORAL | 6 refills | Status: DC

## 2019-02-24 MED ORDER — HYDRALAZINE HCL 50 MG PO TABS: 50.0000 mg | ORAL_TABLET | Freq: Three times a day (TID) | ORAL | 6 refills | Status: DC

## 2019-02-24 MED ORDER — ATENOLOL 25 MG PO TABS: 25.0000 mg | ORAL_TABLET | Freq: Two times a day (BID) | ORAL | 1 refills | Status: DC

## 2019-02-24 MED ORDER — LOSARTAN POTASSIUM 50 MG PO TABS: 50.0000 mg | ORAL_TABLET | Freq: Every day | ORAL | 6 refills | Status: DC

## 2019-02-24 MED ORDER — INDOMETHACIN 25 MG PO CAPS: 25.0000 mg | ORAL_CAPSULE | Freq: Two times a day (BID) | ORAL | 0 refills | Status: DC

## 2019-02-24 MED FILL — INDOMETHACIN 25 MG CAPSULE: 25 | 10 days supply | Qty: 20 | Fill #0

## 2019-02-24 MED FILL — hydrALAZINE HCL 50 MG TABS: 50 | 30 days supply | Qty: 90 | Fill #0

## 2019-02-24 MED FILL — ALLOPURINOL 100 MG TABLET: 100 | 30 days supply | Qty: 60 | Fill #0

## 2019-02-24 MED FILL — ATENOLOL 25 MG TABLET: 25 | 90 days supply | Qty: 180 | Fill #0

## 2019-02-24 MED FILL — LOSARTAN POTASSIUM 50 MG TA: 50 | 30 days supply | Qty: 30 | Fill #0

## 2019-02-24 NOTE — Patient Instructions (Signed)
Low-Purine Eating Plan A low-purine eating plan involves making food choices to limit your intake of purine. Purine is a kind of uric acid. Too much uric acid in your blood can cause certain conditions, such as gout and kidney stones. Eating a low-purine diet can help control these conditions. What are tips for following this plan? Reading food labels   Avoid foods with saturated or Trans fat.  Check the ingredient list of grains-based foods, such as bread and cereal, to make sure that they contain whole grains.  Check the ingredient list of sauces or soups to make sure they do not contain meat or fish.  When choosing soft drinks, check the ingredient list to make sure they do not contain high-fructose corn syrup. Shopping  Buy plenty of fresh fruits and vegetables.  Avoid buying canned or fresh fish.  Buy dairy products labeled as low-fat or nonfat.  Avoid buying premade or processed foods. These foods are often high in fat, salt (sodium), and added sugar. Cooking  Use olive oil instead of butter when cooking. Oils like olive oil, canola oil, and sunflower oil contain healthy fats. Meal planning  Learn which foods do or do not affect you. If you find out that a food tends to cause your gout symptoms to flare up, avoid eating that food. You can enjoy foods that do not cause problems. If you have any questions about a food item, talk with your dietitian or health care provider.  Limit foods high in fat, especially saturated fat. Fat makes it harder for your body to get rid of uric acid.  Choose foods that are lower in fat and are lean sources of protein. General guidelines  Limit alcohol intake to no more than 1 drink a day for nonpregnant women and 2 drinks a day for men. One drink equals 12 oz of beer, 5 oz of wine, or 1 oz of hard liquor. Alcohol can affect the way your body gets rid of uric acid.  Drink plenty of water to keep your urine clear or pale yellow. Fluids can help  remove uric acid from your body.  If directed by your health care provider, take a vitamin C supplement.  Work with your health care provider and dietitian to develop a plan to achieve or maintain a healthy weight. Losing weight can help reduce uric acid in your blood. What foods are recommended? The items listed may not be a complete list. Talk with your dietitian about what dietary choices are best for you. Foods low in purines Foods low in purines do not need to be limited. These include:  All fruits.  All low-purine vegetables, pickles, and olives.  Breads, pasta, rice, cornbread, and popcorn. Cake and other baked goods.  All dairy foods.  Eggs, nuts, and nut butters.  Spices and condiments, such as salt, herbs, and vinegar.  Plant oils, butter, and margarine.  Water, sugar-free soft drinks, tea, coffee, and cocoa.  Vegetable-based soups, broths, sauces, and gravies. Foods moderate in purines Foods moderate in purines should be limited to the amounts listed.   cup of asparagus, cauliflower, spinach, mushrooms, or green peas, each day.  2/3 cup uncooked oatmeal, each day.   cup dry wheat bran or wheat germ, each day.  2-3 ounces of meat or poultry, each day.  4-6 ounces of shellfish, such as crab, lobster, oysters, or shrimp, each day.  1 cup cooked beans, peas, or lentils, each day.  Soup, broths, or bouillon made from meat or   fish. Limit these foods as much as possible. What foods are not recommended? The items listed may not be a complete list. Talk with your dietitian about what dietary choices are best for you. Limit your intake of foods high in purines, including:  Beer and other alcohol.  Meat-based gravy or sauce.  Canned or fresh fish, such as: ? Anchovies, sardines, herring, and tuna. ? Mussels and scallops. ? Codfish, trout, and haddock.  Bacon.  Organ meats, such as: ? Liver or kidney. ? Tripe. ? Sweetbreads (thymus gland or pancreas).   Wild game or goose.  Yeast or yeast extract supplements.  Drinks sweetened with high-fructose corn syrup. Summary  Eating a low-purine diet can help control conditions caused by too much uric acid in the body, such as gout or kidney stones.  Choose low-purine foods, limit alcohol, and limit foods high in fat.  You will learn over time which foods do or do not affect you. If you find out that a food tends to cause your gout symptoms to flare up, avoid eating that food. This information is not intended to replace advice given to you by your health care provider. Make sure you discuss any questions you have with your health care provider. Document Released: 06/30/2010 Document Revised: 02/15/2017 Document Reviewed: 04/18/2016 Elsevier Patient Education  2020 [employer] Gout  Gout is painful swelling of your joints. Gout is a type of arthritis. It is caused by having too much uric acid in your body. Uric acid is a chemical that is made when your body breaks down substances called purines. If your body has too much uric acid, sharp crystals can form and build up in your joints. This causes pain and swelling. Gout attacks can happen quickly and be very painful (acute gout). Over time, the attacks can affect more joints and happen more often (chronic gout). What are the causes?  Too much uric acid in your blood. This can happen because: ? Your kidneys do not remove enough uric acid from your blood. ? Your body makes too much uric acid. ? You eat too many foods that are high in purines. These foods include organ meats, some seafood, and beer.  Trauma or stress. What increases the risk?  Having a family history of gout.  Being male and middle-aged.  Being male and having gone through menopause.  Being very overweight (obese).  Drinking alcohol, especially beer.  Not having enough water in the body (being dehydrated).  Losing weight too quickly.  Having an organ transplant.   Having lead poisoning.  Taking certain medicines.  Having kidney disease.  Having a skin condition called psoriasis. What are the signs or symptoms? An attack of acute gout usually happens in just one joint. The most common place is the big toe. Attacks often start at night. Other joints that may be affected include joints of the feet, ankle, knee, fingers, wrist, or elbow. Symptoms of an attack may include:  Very bad pain.  Warmth.  Swelling.  Stiffness.  Shiny, red, or purple skin.  Tenderness. The affected joint may be very painful to touch.  Chills and fever. Chronic gout may cause symptoms more often. More joints may be involved. You may also have white or yellow lumps (tophi) on your hands or feet or in other areas near your joints. How is this treated?  Treatment for this condition has two phases: treating an acute attack and preventing future attacks.  Acute gout treatment may include: ? NSAIDs. ?   Steroids. These are taken by mouth or injected into a joint. ? Colchicine. This medicine relieves pain and swelling. It can be given by mouth or through an IV tube.  Preventive treatment may include: ? Taking small doses of NSAIDs or colchicine daily. ? Using a medicine that reduces uric acid levels in your blood. ? Making changes to your diet. You may need to see a food expert (dietitian) about what to eat and drink to prevent gout. Follow these instructions at home: During a gout attack   If told, put ice on the painful area: ? Put ice in a plastic bag. ? Place a towel between your skin and the bag. ? Leave the ice on for 20 minutes, 2-3 times a day.  Raise (elevate) the painful joint above the level of your heart as often as you can.  Rest the joint as much as possible. If the joint is in your leg, you may be given crutches.  Follow instructions from your doctor about what you cannot eat or drink. Avoiding future gout attacks  Eat a low-purine diet. Avoid  foods and drinks such as: ? Liver. ? Kidney. ? Anchovies. ? Asparagus. ? Herring. ? Mushrooms. ? Mussels. ? Beer.  Stay at a healthy weight. If you want to lose weight, talk with your doctor. Do not lose weight too fast.  Start or continue an exercise plan as told by your doctor. Eating and drinking  Drink enough fluids to keep your pee (urine) pale yellow.  If you drink alcohol: ? Limit how much you use to:  0-1 drink a day for women.  0-2 drinks a day for men. ? Be aware of how much alcohol is in your drink. In the [place] one drink equals one 12 oz bottle of beer (355 mL), one 5 oz glass of wine (148 mL), or one 1 oz glass of hard liquor (44 mL). General instructions  Take over-the-counter and prescription medicines only as told by your doctor.  Do not drive or use heavy machinery while taking prescription pain medicine.  Return to your normal activities as told by your doctor. Ask your doctor what activities are safe for you.  Keep all follow-up visits as told by your doctor. This is important. Contact a doctor if:  You have another gout attack.  You still have symptoms of a gout attack after 10 days of treatment.  You have problems (side effects) because of your medicines.  You have chills or a fever.  You have burning pain when you pee (urinate).  You have pain in your lower back or belly. Get help right away if:  You have very bad pain.  Your pain cannot be controlled.  You cannot pee. Summary  Gout is painful swelling of the joints.  The most common site of pain is the big toe, but it can affect other joints.  Medicines and avoiding some foods can help to prevent and treat gout attacks. This information is not intended to replace advice given to you by your health care provider. Make sure you discuss any questions you have with your health care provider. Document Released: 12/13/2007 Document Revised: 09/25/2017 Document Reviewed: 09/25/2017  Elsevier Patient Education  2020 [employer]  

## 2019-02-24 NOTE — Progress Notes (Addendum)
Patient [facility]   Established Patient Office Visit  Subjective:  Patient ID: Michael [name], male    DOB: [birth year]  Age: 43 y.o. MRN: [id number]  CC:  Chief Complaint  Patient presents with  . Pain    right foot pain     HPI Michael [name], a pleasant 43 year old male with a medical history significant for malignant hypertension, chronic kidney disease stage IIIb, and gout presents complaining of right foot pain that is consistent with previous gout exacerbation.  Patient has been on allopurinol for gout for greater than a year.  He admits to missing some doses of medications.  He also says that he did not follow a low purine diet over the Thanksgiving holidays.  He says that he has been eating an increased amount of beef, pork, shrimp, and drinking some beer.  Current right foot pain is 7/10 characterized as constant and throbbing.  Patient says that it is painful to apply her shoes.  He also endorses right foot swelling. He denies fever, chills, chest pain, shortness of breath, paresthesias, headache, blurry vision, nausea, vomiting, or diarrhea.  He is taking all other medications consistently.  Past Medical History:  Diagnosis Date  . Hypertension   . Polycystic kidney disease   . Sleep apnea   . Vitamin D deficiency 11/2018    History reviewed. No pertinent surgical history.  Family History  Problem Relation Age of Onset  . Bipolar disorder Father     Social History   Socioeconomic History  . Marital status: Single    Spouse name: Not on file  . Number of children: Not on file  . Years of education: Not on file  . Highest education level: Not on file  Occupational History  . Not on file  Social Needs  . Financial resource strain: Not on file  . Food insecurity    Worry: Not on file    Inability: Not on file  . Transportation needs    Medical: Not on file    Non-medical: Not on file  Tobacco Use  . Smoking status:  Former Research scientist (life sciences)  . Smokeless tobacco: Never Used  Substance and Sexual Activity  . Alcohol use: Yes    Comment: wine occ   . Drug use: No  . Sexual activity: Yes  Lifestyle  . Physical activity    Days per week: Not on file    Minutes per session: Not on file  . Stress: Not on file  Relationships  . Social Herbalist on phone: Not on file    Gets together: Not on file    Attends religious service: Not on file    Active member of club or organization: Not on file    Attends meetings of clubs or organizations: Not on file    Relationship status: Not on file  . Intimate partner violence    Fear of current or ex partner: Not on file    Emotionally abused: Not on file    Physically abused: Not on file    Forced sexual activity: Not on file  Other Topics Concern  . Not on file  Social History Narrative  . Not on file    Outpatient Medications Prior to Visit  Medication Sig Dispense Refill  . allopurinol (ZYLOPRIM) 100 MG tablet Take 1 tablet (100 mg total) by mouth 2 (two) times daily. 60 tablet 6  . atenolol (TENORMIN) 25 MG tablet Take 1 tablet (25  mg total) by mouth 2 (two) times daily. 180 tablet 1  . hydrALAZINE (APRESOLINE) 50 MG tablet Take 1 tablet (50 mg total) by mouth every 8 (eight) hours. 90 tablet 6  . losartan (COZAAR) 50 MG tablet Take 1 tablet (50 mg total) by mouth daily. 30 tablet 6  . multivitamin (ONE-A-DAY MEN'S) TABS tablet Take 1 tablet by mouth daily. (Patient not taking: Reported on 12/12/2018) 30 tablet 11  . Vitamin D, Ergocalciferol, (DRISDOL) 1.25 MG (50000 UT) CAPS capsule Take 1 capsule (50,000 Units total) by mouth every 7 (seven) days. 5 capsule 3   No facility-administered medications prior to visit.     Allergies  Allergen Reactions  . Shellfish Allergy Anaphylaxis and Other (See Comments)    ROS Review of Systems  Constitutional: Negative for activity change and appetite change.  HENT: Negative.   Eyes: Negative.    Respiratory: Negative.   Cardiovascular: Negative.   Gastrointestinal: Negative.   Endocrine: Negative.   Genitourinary: Negative.   Musculoskeletal: Positive for joint swelling (Right foot pain).  Neurological: Negative.   Hematological: Negative.   Psychiatric/Behavioral: Negative.       Objective:    Physical Exam  Constitutional: He is oriented to person, place, and time. He appears well-developed and well-nourished.  HENT:  Head: Normocephalic and atraumatic.  Eyes: Pupils are equal, round, and reactive to light.  Abdominal: Soft.  Musculoskeletal:     Cervical back: Normal range of motion.     Right ankle: Swelling present. Decreased range of motion.     Comments: Right ankle tender to palpation  Neurological: He is alert and oriented to person, place, and time.  Skin: Skin is warm and dry.  Psychiatric: He has a normal mood and affect. His behavior is normal. Judgment and thought content normal.    BP (!) 149/87   Pulse 73   Temp 98.5 F (36.9 C) (Oral)   Ht 5\' 11"  (1.803 m)   Wt 274 lb 3.2 oz (124.4 kg)   SpO2 95%   BMI 38.24 kg/m  Wt Readings from Last 3 Encounters:  02/24/19 274 lb 3.2 oz (124.4 kg)  12/12/18 278 lb 12.8 oz (126.5 kg)  04/02/18 271 lb (122.9 kg)     There are no preventive care reminders to display for this patient.  There are no preventive care reminders to display for this patient.  Lab Results  Component Value Date   TSH 2.730 12/12/2018   Lab Results  Component Value Date   WBC 8.7 12/12/2018   HGB 12.5 (L) 12/12/2018   HCT 38.1 12/12/2018   MCV 84 12/12/2018   PLT 274 12/12/2018   Lab Results  Component Value Date   NA 139 12/12/2018   K 5.4 (H) 12/12/2018   CO2 17 (L) 12/12/2018   GLUCOSE 96 12/12/2018   BUN 39 (H) 12/12/2018   CREATININE 3.12 (H) 12/12/2018   BILITOT 0.2 12/12/2018   ALKPHOS 63 12/12/2018   AST 19 12/12/2018   ALT 21 12/12/2018   PROT 7.1 12/12/2018   ALBUMIN 4.0 12/12/2018   CALCIUM 9.4  12/12/2018   ANIONGAP 9 05/25/2016   Lab Results  Component Value Date   CHOL 221 (H) 12/12/2018   Lab Results  Component Value Date   HDL 35 (L) 12/12/2018   Lab Results  Component Value Date   LDLCALC 138 (H) 12/12/2018   Lab Results  Component Value Date   TRIG 263 (H) 12/12/2018   Lab Results  Component Value  Date   CHOLHDL 6.3 (H) 12/12/2018   Lab Results  Component Value Date   HGBA1C 5.7 06/19/2016      Assessment & Plan:   Problem List Items Addressed This Visit      Cardiovascular and Mediastinum   Hypertension - Primary   Relevant Orders   Urinalysis Dipstick (Completed)   Basic Metabolic Panel     Other   Chronic gout due to renal impairment without tophus   Relevant Medications   indomethacin (INDOCIN) 25 MG capsule   Other Relevant Orders   Uric Acid    Other Visit Diagnoses    Acute gout due to renal impairment involving right ankle       Relevant Medications   indomethacin (INDOCIN) 25 MG capsule   Other Relevant Orders   Uric Acid      Meds ordered this encounter  Medications  . indomethacin (INDOCIN) 25 MG capsule    Sig: Take 1 capsule (25 mg total) by mouth 2 (two) times daily with a meal.    Dispense:  20 capsule    Refill:  0    Order Specific Question:   Supervising Provider    Answer:   Tresa Garter [[id number]]   4.  Chronic kidney disease stage IIIb: Patient says that he is currently furloughed from his full-time job and no longer has health insurance.  He has not been able to follow-up with nephrology for kidney disease.  Will review creatinine level.  Also, will need to explore self-pay options in order to continue care with nephrologist.  2. Chronic gout due to renal impairment without tophus, unspecified site - Uric Acid - indomethacin (INDOCIN) 25 MG capsule; Take 1 capsule (25 mg total) by mouth 2 (two) times daily with a meal.  Dispense: 20 capsule; Refill: 0 - allopurinol (ZYLOPRIM) 100 MG tablet; Take 1  tablet (100 mg total) by mouth 2 (two) times daily.  Dispense: 60 tablet; Refill: 6  3. Acute gout due to renal impairment involving right ankle - Uric Acid - indomethacin (INDOCIN) 25 MG capsule; Take 1 capsule (25 mg total) by mouth 2 (two) times daily with a meal.  Dispense: 20 capsule; Refill: 0  4. Essential hypertension, benign Patient is requesting refills on hypertension medication.  Continue medication, monitor blood pressure at home. Continue DASH diet. Reminder to go to the ER if any CP, SOB, nausea, dizziness, severe HA, changes vision/speech, left arm numbness and tingling and jaw pain.    - atenolol (TENORMIN) 25 MG tablet; Take 1 tablet (25 mg total) by mouth 2 (two) times daily.  Dispense: 180 tablet; Refill: 1 - losartan (COZAAR) 50 MG tablet; Take 1 tablet (50 mg total) by mouth daily.  Dispense: 30 tablet; Refill: 6 - hydrALAZINE (APRESOLINE) 50 MG tablet; Take 1 tablet (50 mg total) by mouth every 8 (eight) hours.  Dispense: 90 tablet; Refill: 6    Health maintenance: Patient will need complete physical exam with prostate exam, PSA, Recommend yearly eye exam Patient warrants A1c at CPE appointment Moderate obesity, class III, recommend a low-fat, low carbohydrate diet divided over 5-6 small meals per day.   Follow-up: Return in about 3 months (around 05/25/2019).     [name]  APRN, MSN, FNP-C Patient [facility] [address] [place], [place] [address] [phone]

## 2019-02-25 LAB — BASIC METABOLIC PANEL
BUN/Creatinine Ratio: 14 (ref 9–20)
BUN: 54 mg/dL — ABNORMAL HIGH (ref 6–24)
CO2: 15 mmol/L — ABNORMAL LOW (ref 20–29)
Calcium: 8.7 mg/dL (ref 8.7–10.2)
Chloride: 108 mmol/L — ABNORMAL HIGH (ref 96–106)
Creatinine, Ser: 3.87 mg/dL — ABNORMAL HIGH (ref 0.76–1.27)
GFR calc Af Amer: 21 mL/min/{1.73_m2} — ABNORMAL LOW (ref >59)
GFR calc non Af Amer: 18 mL/min/{1.73_m2} — ABNORMAL LOW (ref >59)
Glucose: 84 mg/dL (ref 65–99)
Potassium: 6.1 mmol/L — ABNORMAL HIGH (ref 3.5–5.2)
Sodium: 140 mmol/L (ref 134–144)

## 2019-02-25 LAB — URIC ACID: Uric Acid: 8.5 mg/dL — ABNORMAL HIGH (ref 3.8–8.4)

## 2019-05-04 MED FILL — AMLODIPINE BESYLATE 10 MG T: 10 | 30 days supply | Qty: 30 | Fill #0

## 2019-05-11 ENCOUNTER — Encounter: Payer: Self-pay | Admitting: Gastroenterology

## 2019-05-15 ENCOUNTER — Emergency Department (HOSPITAL_COMMUNITY): Payer: Self-pay

## 2019-05-15 ENCOUNTER — Emergency Department (HOSPITAL_COMMUNITY)
Admission: EM | Admit: 2019-05-15 | Discharge: 2019-05-15 | Disposition: A | Discharge status: Home / Self Care | Payer: Self-pay | Attending: Emergency Medicine | Admitting: Emergency Medicine

## 2019-05-15 ENCOUNTER — Other Ambulatory Visit: Payer: Self-pay

## 2019-05-15 ENCOUNTER — Encounter (HOSPITAL_COMMUNITY): Payer: Self-pay | Admitting: *Deleted

## 2019-05-15 DIAGNOSIS — Z87891 Personal history of nicotine dependence: Secondary | ICD-10-CM | POA: Insufficient documentation

## 2019-05-15 DIAGNOSIS — I129 Hypertensive chronic kidney disease with stage 1 through stage 4 chronic kidney disease, or unspecified chronic kidney disease: Secondary | ICD-10-CM | POA: Insufficient documentation

## 2019-05-15 DIAGNOSIS — N183 Chronic kidney disease, stage 3 unspecified: Secondary | ICD-10-CM | POA: Insufficient documentation

## 2019-05-15 DIAGNOSIS — Z79899 Other long term (current) drug therapy: Secondary | ICD-10-CM | POA: Insufficient documentation

## 2019-05-15 DIAGNOSIS — N189 Chronic kidney disease, unspecified: Secondary | ICD-10-CM

## 2019-05-15 DIAGNOSIS — R1011 Right upper quadrant pain: Secondary | ICD-10-CM | POA: Insufficient documentation

## 2019-05-15 DIAGNOSIS — B349 Viral infection, unspecified: Secondary | ICD-10-CM | POA: Insufficient documentation

## 2019-05-15 DIAGNOSIS — Z20822 Contact with and (suspected) exposure to covid-19: Secondary | ICD-10-CM | POA: Insufficient documentation

## 2019-05-15 LAB — LIPASE, BLOOD: Lipase: 29 U/L (ref 11–51)

## 2019-05-15 LAB — COMPREHENSIVE METABOLIC PANEL
ALT: 18 U/L (ref 0–44)
AST: 17 U/L (ref 15–41)
Albumin: 3.8 g/dL (ref 3.5–5.0)
Alkaline Phosphatase: 47 U/L (ref 38–126)
Anion gap: 9 (ref 5–15)
BUN: 51 mg/dL — ABNORMAL HIGH (ref 6–20)
CO2: 20 mmol/L — ABNORMAL LOW (ref 22–32)
Calcium: 8.2 mg/dL — ABNORMAL LOW (ref 8.9–10.3)
Chloride: 109 mmol/L (ref 98–111)
Creatinine, Ser: 4.5 mg/dL — ABNORMAL HIGH (ref 0.61–1.24)
GFR calc Af Amer: 17 mL/min — ABNORMAL LOW (ref >60)
GFR calc non Af Amer: 15 mL/min — ABNORMAL LOW (ref >60)
Glucose, Bld: 87 mg/dL (ref 70–99)
Potassium: 5.9 mmol/L — ABNORMAL HIGH (ref 3.5–5.1)
Sodium: 138 mmol/L (ref 135–145)
Total Bilirubin: 0.7 mg/dL (ref 0.3–1.2)
Total Protein: 7.5 g/dL (ref 6.5–8.1)

## 2019-05-15 LAB — URINALYSIS, ROUTINE W REFLEX MICROSCOPIC
Bacteria, UA: NONE SEEN
Bilirubin Urine: NEGATIVE
Glucose, UA: NEGATIVE mg/dL
Hgb urine dipstick: NEGATIVE
Ketones, ur: NEGATIVE mg/dL
Nitrite: NEGATIVE
Protein, ur: 100 mg/dL — AB
Specific Gravity, Urine: 1.01 (ref 1.005–1.030)
pH: 6 (ref 5.0–8.0)

## 2019-05-15 LAB — CBC WITH DIFFERENTIAL/PLATELET
Abs Immature Granulocytes: 0.03 10*3/uL (ref 0.00–0.07)
Basophils Absolute: 0 10*3/uL (ref 0.0–0.1)
Basophils Relative: 1 %
Eosinophils Absolute: 0.3 10*3/uL (ref 0.0–0.5)
Eosinophils Relative: 4 %
HCT: 37.2 % — ABNORMAL LOW (ref 39.0–52.0)
Hemoglobin: 11.8 g/dL — ABNORMAL LOW (ref 13.0–17.0)
Immature Granulocytes: 0 %
Lymphocytes Relative: 26 %
Lymphs Abs: 2.3 10*3/uL (ref 0.7–4.0)
MCH: 27.5 pg (ref 26.0–34.0)
MCHC: 31.7 g/dL (ref 30.0–36.0)
MCV: 86.7 fL (ref 80.0–100.0)
Monocytes Absolute: 0.6 10*3/uL (ref 0.1–1.0)
Monocytes Relative: 7 %
Neutro Abs: 5.5 10*3/uL (ref 1.7–7.7)
Neutrophils Relative %: 62 %
Platelets: 246 10*3/uL (ref 150–400)
RBC: 4.29 MIL/uL (ref 4.22–5.81)
RDW: 14.5 % (ref 11.5–15.5)
WBC: 8.7 10*3/uL (ref 4.0–10.5)
nRBC: 0 % (ref 0.0–0.2)

## 2019-05-15 LAB — POC SARS CORONAVIRUS 2 AG -  ED: SARS Coronavirus 2 Ag: NEGATIVE

## 2019-05-15 LAB — SARS CORONAVIRUS 2 (TAT 6-24 HRS): SARS Coronavirus 2: NEGATIVE

## 2019-05-15 MED ORDER — ACETAMINOPHEN 500 MG PO TABS
1000.0000 mg | ORAL_TABLET | Freq: Once | ORAL | Status: AC
  Administered 2019-05-15: 1000 mg via ORAL
  Filled 2019-05-15: qty 2

## 2019-05-15 MED ORDER — FENTANYL CITRATE (PF) 100 MCG/2ML IJ SOLN
50.0000 ug | Freq: Once | INTRAMUSCULAR | Status: AC
  Administered 2019-05-15: 50 ug via INTRAVENOUS
  Filled 2019-05-15: qty 2

## 2019-05-15 NOTE — ED Notes (Signed)
US at bedside

## 2019-05-15 NOTE — ED Notes (Signed)
Attempted IV. Unsuccessful. NT to attempt blood draw.

## 2019-05-15 NOTE — ED Notes (Signed)
This RN called lab to inquire about why pts labs are not in process. Per Lab: Pts labs were not received. Labs were collected and sent to lab via tube station by this RN. Phlebotomy notified need for lab collect.

## 2019-05-15 NOTE — ED Provider Notes (Addendum)
[facility] DEPT Provider Note   CSN: [id number] Arrival date & time: 05/15/19  3154     History Chief Complaint  Patient presents with  . Headache  . Blood In Stools  . Emesis with blood  . Tightness in chest/Lungs    Michael [name] is a 44 y.o. male.  Patient is a 43 year old male who has a history of hypertension, polycystic kidney disease, sleep apnea and obesity who presents with flulike symptoms and cough.  He states he has been feeling bad for the last 2 to 3 days.  He has been achy and feels like he may have pneumonia.  He has had some coughing but not excessive.  He feels a little short of breath on ambulation.  He has some blood that he says he has been vomiting although on clarification, it sounds like it is mixed with mucus in his spitting out of the back of his throat intermittently.  He did have some vomiting yesterday which he says had some normal stomach context and small amount of blood with mucus.  He also has had some blood in his stool although he said this is been going on for a bit longer.  He does have a history of constipation and he has discussed this with his nephrologist who recently put him on some medicine for constipation.  He has some pain in his upper abdomen that radiates up into his chest.  It is more in the right upper quadrant.  Is not related to eating.  No known fevers.  He said he has been urinating a lot but no burning on urination.  One of his friends recently was treated for Covid and he had exposure to him about 3 days ago.  He has no prior known history of Covid and has not been vaccinated.        Past Medical History:  Diagnosis Date  . Hypertension   . Polycystic kidney disease   . Sleep apnea   . Vitamin D deficiency 11/2018    Patient Active Problem List   Diagnosis Date Noted  . Chronic gout due to renal impairment without tophus 12/14/2018  . Observed sleep apnea 09/01/2016  . Excessive daytime  sleepiness 09/01/2016  . Primary snoring 09/01/2016  . Acute pyelonephritis 05/25/2016  . Acute gouty arthritis 05/25/2016  . Hematuria 05/25/2016  . Essential hypertension, benign 05/25/2016  . Morbid obesity ([place]) 05/25/2016  . Hypertension 05/23/2016  . CKD (chronic kidney disease) stage 3, GFR 30-59 ml/min 05/23/2016  . Adult polycystic kidney disease 05/23/2016  . SIRS (systemic inflammatory response syndrome) ([place]) 05/23/2016    History reviewed. No pertinent surgical history.     Family History  Problem Relation Age of Onset  . Bipolar disorder Father     Social History   Tobacco Use  . Smoking status: Former Research scientist (life sciences)  . Smokeless tobacco: Never Used  Substance Use Topics  . Alcohol use: Yes    Comment: wine occ   . Drug use: No    Home Medications Prior to Admission medications   Medication Sig Start Date End Date Taking? Authorizing Provider  allopurinol (ZYLOPRIM) 100 MG tablet Take 1 tablet (100 mg total) by mouth 2 (two) times daily. 02/24/19  Yes [name], FNP  APPLE CIDER VINEGAR PO Take 1 tablet by mouth daily. [place] Gummie   Yes [provider]  atenolol (TENORMIN) 25 MG tablet Take 1 tablet (25 mg total) by mouth 2 (two) times daily. 02/24/19  Yes [name], FNP  Flaxseed, Linseed, (FLAX SEED OIL PO) Take 5 mLs by mouth daily.   Yes [provider]  hydrALAZINE (APRESOLINE) 50 MG tablet Take 1 tablet (50 mg total) by mouth every 8 (eight) hours. Patient taking differently: Take 50 mg by mouth in the morning and at bedtime.  02/24/19  Yes [name], FNP  indomethacin (INDOCIN) 25 MG capsule Take 1 capsule (25 mg total) by mouth 2 (two) times daily with a meal. Patient taking differently: Take 25 mg by mouth daily as needed (gout).  02/24/19  Yes [name], FNP  multivitamin (ONE-A-DAY MEN'S) TABS tablet Take 1 tablet by mouth daily. 03/21/18  Yes Lanae Boast, FNP  losartan (COZAAR) 50 MG tablet Take 1 tablet (50  mg total) by mouth daily. 02/24/19   [name], FNP    Allergies    Patient has no known allergies.  Review of Systems   Review of Systems  Constitutional: Positive for chills and fatigue. Negative for diaphoresis and fever.  HENT: Positive for congestion. Negative for rhinorrhea and sneezing.   Eyes: Negative.   Respiratory: Positive for cough, chest tightness and shortness of breath.   Cardiovascular: Negative for chest pain and leg swelling.  Gastrointestinal: Positive for abdominal pain, nausea and vomiting. Negative for blood in stool and diarrhea.  Genitourinary: Negative for difficulty urinating, flank pain, frequency and hematuria.  Musculoskeletal: Positive for myalgias. Negative for arthralgias and back pain.  Skin: Negative for rash.  Neurological: Positive for headaches. Negative for dizziness, speech difficulty, weakness and numbness.    Physical Exam Updated Vital Signs BP (!) 180/116   Pulse 64   Temp 98.7 F (37.1 C) (Oral)   Resp 16   Ht 6\' 1"  (1.854 m)   Wt 124.7 kg   SpO2 97%   BMI 36.28 kg/m   Physical Exam Constitutional:      Appearance: He is well-developed.  HENT:     Head: Normocephalic and atraumatic.  Eyes:     Pupils: Pupils are equal, round, and reactive to light.  Cardiovascular:     Rate and Rhythm: Normal rate and regular rhythm.     Heart sounds: Normal heart sounds.  Pulmonary:     Effort: Pulmonary effort is normal. No respiratory distress.     Breath sounds: Normal breath sounds. No wheezing or rales.  Chest:     Chest wall: No tenderness.  Abdominal:     General: Bowel sounds are normal.     Palpations: Abdomen is soft.     Tenderness: There is abdominal tenderness (RUQ). There is no guarding or rebound.  Musculoskeletal:        General: Normal range of motion.     Cervical back: Normal range of motion and neck supple.  Lymphadenopathy:     Cervical: No cervical adenopathy.  Skin:    General: Skin is warm and dry.       Findings: No rash.  Neurological:     Mental Status: He is alert and oriented to person, place, and time.     ED Results / Procedures / Treatments   Labs (all labs ordered are listed, but only abnormal results are displayed) Labs Reviewed  COMPREHENSIVE METABOLIC PANEL - Abnormal; Notable for the following components:      Result Value   Potassium 5.9 (*)    CO2 20 (*)    BUN 51 (*)    Creatinine, Ser 4.50 (*)    Calcium  8.2 (*)    GFR calc non Af Amer 15 (*)    GFR calc Af Amer 17 (*)    All other components within normal limits  CBC WITH DIFFERENTIAL/PLATELET - Abnormal; Notable for the following components:   Hemoglobin 11.8 (*)    HCT 37.2 (*)    All other components within normal limits  URINALYSIS, ROUTINE W REFLEX MICROSCOPIC - Abnormal; Notable for the following components:   Protein, ur 100 (*)    Leukocytes,Ua TRACE (*)    All other components within normal limits  SARS CORONAVIRUS 2 (TAT 6-24 HRS)  LIPASE, BLOOD  POC SARS CORONAVIRUS 2 AG -  ED    EKG EKG Interpretation  Date/Time:  Friday May 15 2019 10:15:12 EST Ventricular Rate:  61 PR Interval:    QRS Duration: 80 QT Interval:  418 QTC Calculation: 421 R Axis:   44 Text Interpretation: Sinus rhythm Probable LVH with secondary repol abnrm Repol abnrm, global ischemia, diffuse leads Anterior ST elevation, probably due to LVH similar to prior EKGs Confirmed by Malvin Johns [phone]) on 05/15/2019 10:18:08 AM   Radiology DG Chest 2 View  Result Date: 05/15/2019 CLINICAL DATA:  Short of breath EXAM: CHEST - 2 VIEW COMPARISON:  12/06/2009 FINDINGS: The heart size and mediastinal contours are within normal limits. Both lungs are clear. The visualized skeletal structures are unremarkable. IMPRESSION: No active cardiopulmonary disease. Electronically Signed   By: Franchot Gallo M.D.   On: 05/15/2019 10:47   US Abdomen Limited RUQ  Result Date: 05/15/2019 CLINICAL DATA:  Upper abdominal pain EXAM:  ULTRASOUND ABDOMEN LIMITED RIGHT UPPER QUADRANT COMPARISON:  CT abdomen and pelvis December 12, 2018 FINDINGS: Gallbladder: No gallstones or wall thickening visualized. There is no pericholecystic fluid. No sonographic Murphy sign noted by sonographer. Common bile duct: Diameter: 6 mm. No intrahepatic or extrahepatic biliary duct dilatation. Liver: No focal lesion identified. Within normal limits in parenchymal echogenicity. Portal vein is patent on color Doppler imaging with normal direction of blood flow towards the liver. Other: Innumerable cysts are noted in the right kidney consistent with known adult polycystic kidney disease. IMPRESSION: 1. Innumerable cysts in the right kidney consistent with known adult polycystic kidney disease. 2.  Study otherwise unremarkable. Electronically Signed   By: Lowella Grip III M.D.   On: 05/15/2019 12:08    Procedures Procedures (including critical care time)  Medications Ordered in ED Medications  acetaminophen (TYLENOL) tablet 1,000 mg (1,000 mg Oral Given 05/15/19 1107)  fentaNYL (SUBLIMAZE) injection 50 mcg (50 mcg Intravenous Given 05/15/19 1449)    ED Course  I have reviewed the triage vital signs and the nursing notes.  Pertinent labs & imaging results that were available during my care of the patient were reviewed by me and considered in my medical decision making (see chart for details).    MDM Rules/Calculators/A&P                      Patient is a 44 year old male who presents with viral-like symptoms.  He feels like he has pneumonia with feeling of congestion and fatigue with achiness.  He has some discomfort across his upper abdomen which has felt similar to when he had pneumonia before.  He has some mild shortness of breath but no hypoxia, unilateral pleuritic pain, or leg findings which would be more suggestive of DVT/PE.  Chest x-ray is clear without evidence of pneumonia.  His abdominal exam is nonconcerning.  His labs are  nonconcerning  other than his creatinine.  He has known chronic kidney disease.  His creatinine is more elevated than our prior values and his potassium is mildly elevated.  I spoke with Dr. Jonnie Finner with nephrology who looked at the patient's records and patient actually just finished Lokelma as an outpatient and his creatinine is similar to his prior values.  He did not feel that we need to do anything differently.  I did give patient in print out of high potassium foods to avoid.  Patient otherwise is well-appearing.  His rapid Covid is negative.  PCR is pending.  He had a right upper quadrant ultrasound which was negative.  His LFTs and lipase are normal.  I did not feel strongly about doing a CT scan of his abdomen given his findings and I think this is more related to a viral syndrome but I did discuss this with the patient and he at this point does not want to do a CT scan but will keep a close eye on his symptoms and return for any worsening.  He also has follow-up already scheduled with nephrology regarding his kidney function and potassium.  He has had no ongoing blood in his saliva/spit or in his stool.  He was advised to return if any of the symptoms worsen.  He was discharged home in good condition.  Return precautions were given.  His blood pressure is elevated in the ED and he has not taken his home medications today.  He did take them in the ED prior to discharge.  He was given quarantine precautions pending his results.  Michael [name] was evaluated in Emergency Department on 05/15/2019 for the symptoms described in the history of present illness. He was evaluated in the context of the global COVID-19 pandemic, which necessitated consideration that the patient might be at risk for infection with the SARS-CoV-2 virus that causes COVID-19. Institutional protocols and algorithms that pertain to the evaluation of patients at risk for COVID-19 are in a state of rapid change based on information released by  regulatory bodies including the CDC and federal and state organizations. These policies and algorithms were followed during the patient's care in the ED.  Final Clinical Impression(s) / ED Diagnoses Final diagnoses:  RUQ pain  Viral syndrome  Chronic kidney disease, unspecified CKD stage    Rx / DC Orders ED Discharge Orders    None       Malvin Johns, MD 05/15/19 1515    Malvin Johns, MD 05/15/19 1516    Malvin Johns, MD 05/15/19 [phone]

## 2019-05-15 NOTE — ED Notes (Signed)
Per MD: may hold IV at this time. MD to speak with pt. MD will advise next steps

## 2019-05-15 NOTE — ED Triage Notes (Signed)
Pt developed headache and emesis with blood yesterday, feeling of fullness and has seen blood in stool

## 2019-05-15 NOTE — ED Notes (Signed)
Upon going into room to DC pt. Pt was not in room. Pt left prior to DC being reviewed.

## 2019-05-15 NOTE — ED Notes (Signed)
Pt provided with [place] sandwich, gingerale. Pt to take home medications per MD

## 2019-05-15 NOTE — ED Notes (Signed)
Phlebotomy at bedside to recollect labs at this time

## 2019-05-15 NOTE — ED Notes (Signed)
Ed PA [employer] notified of Negative Poc Covid results, will inform Provider [facility]

## 2019-05-25 ENCOUNTER — Telehealth: Payer: Self-pay | Admitting: Family Medicine

## 2019-05-25 NOTE — Telephone Encounter (Signed)
Pt was called and reminded of there appointment 

## 2019-05-26 ENCOUNTER — Other Ambulatory Visit: Payer: Self-pay

## 2019-05-26 ENCOUNTER — Ambulatory Visit (INDEPENDENT_AMBULATORY_CARE_PROVIDER_SITE_OTHER): Payer: BC Managed Care – PPO | Admitting: Family Medicine

## 2019-05-26 ENCOUNTER — Encounter: Payer: Self-pay | Admitting: Family Medicine

## 2019-05-26 VITALS — BP 142/92 | HR 70 | Temp 99.1°F | Resp 16 | Ht 71.0 in | Wt 273.0 lb

## 2019-05-26 DIAGNOSIS — N1832 Chronic kidney disease, stage 3b: Secondary | ICD-10-CM

## 2019-05-26 DIAGNOSIS — I10 Essential (primary) hypertension: Secondary | ICD-10-CM

## 2019-05-26 LAB — POCT URINALYSIS DIPSTICK
Bilirubin, UA: NEGATIVE
Glucose, UA: NEGATIVE
Ketones, UA: NEGATIVE
Leukocytes, UA: NEGATIVE
Nitrite, UA: NEGATIVE
Protein, UA: POSITIVE — AB
Spec Grav, UA: 1.02 (ref 1.010–1.025)
Urobilinogen, UA: 0.2 E.U./dL
pH, UA: 6 (ref 5.0–8.0)

## 2019-05-26 NOTE — Progress Notes (Signed)
Established Patient Office Visit  Subjective:  Patient ID: Michael [name], male    DOB: 1975-09-29  Age: 44 y.o. MRN: [id number]  CC:  Chief Complaint  Patient presents with  . Constipation    pt states irregular bowel movements  . Hypertension   Etium [name] is a 44 year old male with a medical history significant for uncontrolled hypertension, CKD stage IV, polycystic kidney disease, and gouty arthritis presents for a follow up of chronic conditions. Patient says that he is doing well and is without complaint. He is followed by nephrology who recently made changes to anti hypertensive medication regimen. Patient does not remember the changes. He says that he has not called office to clarify changes. He says that he has been taking all medications consistently without interruption. Patient recently started an exercise regimen. He says that he has been following a low fat diet. He does not check blood pressure at home.   Hypertension This is a chronic problem. The problem is uncontrolled. Pertinent negatives include no anxiety, chest pain, headaches, malaise/fatigue, orthopnea, palpitations, peripheral edema, shortness of breath or sweats. Risk factors for coronary artery disease include obesity. The current treatment provides mild improvement. Compliance problems include diet and exercise.  There is no history of chronic renal disease or sleep apnea.    Past Medical History:  Diagnosis Date  . Hypertension   . Polycystic kidney disease   . Sleep apnea   . Vitamin D deficiency 11/2018    History reviewed. No pertinent surgical history.  Family History  Problem Relation Age of Onset  . Bipolar disorder Father     Social History   Socioeconomic History  . Marital status: Single    Spouse name: Not on file  . Number of children: Not on file  . Years of education: Not on file  . Highest education level: Not on file  Occupational History  . Not on file  Tobacco Use  . Smoking  status: Former Research scientist (life sciences)  . Smokeless tobacco: Never Used  Substance and Sexual Activity  . Alcohol use: Yes    Comment: wine occ   . Drug use: No  . Sexual activity: Yes  Other Topics Concern  . Not on file  Social History Narrative  . Not on file   Social Determinants of Health   Financial Resource Strain:   . Difficulty of Paying Living Expenses: Not on file  Food Insecurity:   . Worried About Charity fundraiser in the Last Year: Not on file  . Ran Out of Food in the Last Year: Not on file  Transportation Needs:   . Lack of Transportation (Medical): Not on file  . Lack of Transportation (Non-Medical): Not on file  Physical Activity:   . Days of Exercise per Week: Not on file  . Minutes of Exercise per Session: Not on file  Stress:   . Feeling of Stress : Not on file  Social Connections:   . Frequency of Communication with Friends and Family: Not on file  . Frequency of Social Gatherings with Friends and Family: Not on file  . Attends Religious Services: Not on file  . Active Member of Clubs or Organizations: Not on file  . Attends Archivist Meetings: Not on file  . Marital Status: Not on file  Intimate Partner Violence:   . Fear of Current or Ex-Partner: Not on file  . Emotionally Abused: Not on file  . Physically Abused: Not on file  .  Sexually Abused: Not on file    Outpatient Medications Prior to Visit  Medication Sig Dispense Refill  . allopurinol (ZYLOPRIM) 100 MG tablet Take 1 tablet (100 mg total) by mouth 2 (two) times daily. 60 tablet 6  . APPLE CIDER VINEGAR PO Take 1 tablet by mouth daily. Goli Gummie    . atenolol (TENORMIN) 25 MG tablet Take 1 tablet (25 mg total) by mouth 2 (two) times daily. 180 tablet 1  . Flaxseed, Linseed, (FLAX SEED OIL PO) Take 5 mLs by mouth daily.    . hydrALAZINE (APRESOLINE) 50 MG tablet Take 1 tablet (50 mg total) by mouth every 8 (eight) hours. (Patient taking differently: Take 50 mg by mouth in the morning and at  bedtime. ) 90 tablet 6  . indomethacin (INDOCIN) 25 MG capsule Take 1 capsule (25 mg total) by mouth 2 (two) times daily with a meal. (Patient taking differently: Take 25 mg by mouth daily as needed (gout). ) 20 capsule 0  . multivitamin (ONE-A-DAY MEN'S) TABS tablet Take 1 tablet by mouth daily. 30 tablet 11  . losartan (COZAAR) 50 MG tablet Take 1 tablet (50 mg total) by mouth daily. (Patient not taking: Reported on 05/26/2019) 30 tablet 6   No facility-administered medications prior to visit.    No Known Allergies  ROS Review of Systems  Constitutional: Negative.  Negative for malaise/fatigue.  HENT: Negative.   Respiratory: Negative for shortness of breath.   Cardiovascular: Negative for chest pain, palpitations and orthopnea.  Gastrointestinal: Positive for constipation.  Endocrine: Negative.   Genitourinary: Negative.   Musculoskeletal: Negative.   Neurological: Negative for headaches.  Psychiatric/Behavioral: Negative.       Objective:    Physical Exam  Constitutional: He is oriented to person, place, and time. He appears well-developed and well-nourished.  HENT:  Head: Normocephalic and atraumatic.  Pulmonary/Chest: Effort normal and breath sounds normal.  Abdominal: Soft. He exhibits no distension. There is no abdominal tenderness.  Musculoskeletal:        General: Normal range of motion.     Cervical back: Normal range of motion.  Neurological: He is alert and oriented to person, place, and time.  Skin: Skin is warm.  Psychiatric: He has a normal mood and affect. His behavior is normal.    BP (!) 146/90 (BP Location: Left Arm, Patient Position: Sitting, Cuff Size: Large) Comment: manually  Pulse 70   Temp 99.1 F (37.3 C) (Oral)   Resp 16   Ht 5\' 11"  (1.803 m)   Wt 273 lb (123.8 kg)   SpO2 98%   BMI 38.08 kg/m  Wt Readings from Last 3 Encounters:  05/26/19 273 lb (123.8 kg)  05/15/19 275 lb (124.7 kg)  02/24/19 274 lb 3.2 oz (124.4 kg)    Lab Results   Component Value Date   TSH 2.730 12/12/2018   Lab Results  Component Value Date   WBC 8.7 05/15/2019   HGB 11.8 (L) 05/15/2019   HCT 37.2 (L) 05/15/2019   MCV 86.7 05/15/2019   PLT 246 05/15/2019   Lab Results  Component Value Date   NA 138 05/15/2019   K 5.9 (H) 05/15/2019   CO2 20 (L) 05/15/2019   GLUCOSE 87 05/15/2019   BUN 51 (H) 05/15/2019   CREATININE 4.50 (H) 05/15/2019   BILITOT 0.7 05/15/2019   ALKPHOS 47 05/15/2019   AST 17 05/15/2019   ALT 18 05/15/2019   PROT 7.5 05/15/2019   ALBUMIN 3.8 05/15/2019   CALCIUM 8.2 (  L) 05/15/2019   ANIONGAP 9 05/15/2019   Lab Results  Component Value Date   CHOL 221 (H) 12/12/2018   Lab Results  Component Value Date   HDL 35 (L) 12/12/2018   Lab Results  Component Value Date   LDLCALC 138 (H) 12/12/2018   Lab Results  Component Value Date   TRIG 263 (H) 12/12/2018   Lab Results  Component Value Date   CHOLHDL 6.3 (H) 12/12/2018   Lab Results  Component Value Date   HGBA1C 5.7 06/19/2016      Assessment & Plan:   Problem List Items Addressed This Visit      Cardiovascular and Mediastinum   Hypertension - Primary   Relevant Orders   Urinalysis Dipstick   Basic Metabolic Panel     Genitourinary   CKD (chronic kidney disease) stage 3, GFR 30-59 ml/min     Essential hypertension BP (!) 142/92 Comment: manually  Pulse 70   Temp 99.1 F (37.3 C) (Oral)   Resp 16   Ht 5\' 11"  (1.803 m)   Wt 273 lb (123.8 kg)   SpO2 98%   BMI 38.08 kg/m  Blood pressure is significantly improved from previous. Will contact pharmacy to complete medication reconciliation.  - Urinalysis Dipstick - Basic Metabolic Panel  Stage 3b chronic kidney disease Discussed the importance of following up with nephrologist consistently and take all prescribed medications in order to achieve positive outcomes.   Follow-up: Return in about 6 months (around 11/26/2019).    [name]  APRN, MSN, FNP-C [facility] [address] [place], [place] [address] [phone]

## 2019-05-27 ENCOUNTER — Other Ambulatory Visit: Payer: Self-pay | Admitting: Family Medicine

## 2019-05-27 ENCOUNTER — Telehealth: Payer: Self-pay

## 2019-05-27 DIAGNOSIS — M1A30X0 Chronic gout due to renal impairment, unspecified site, without tophus (tophi): Secondary | ICD-10-CM

## 2019-05-27 DIAGNOSIS — M10371 Gout due to renal impairment, right ankle and foot: Secondary | ICD-10-CM

## 2019-05-27 LAB — BASIC METABOLIC PANEL
BUN/Creatinine Ratio: 11 (ref 9–20)
BUN: 51 mg/dL — ABNORMAL HIGH (ref 6–24)
CO2: 16 mmol/L — ABNORMAL LOW (ref 20–29)
Calcium: 8.9 mg/dL (ref 8.7–10.2)
Chloride: 105 mmol/L (ref 96–106)
Creatinine, Ser: 4.62 mg/dL — ABNORMAL HIGH (ref 0.76–1.27)
GFR calc Af Amer: 17 mL/min/{1.73_m2} — ABNORMAL LOW (ref >59)
GFR calc non Af Amer: 14 mL/min/{1.73_m2} — ABNORMAL LOW (ref >59)
Glucose: 93 mg/dL (ref 65–99)
Potassium: 6.3 mmol/L — ABNORMAL HIGH (ref 3.5–5.2)
Sodium: 138 mmol/L (ref 134–144)

## 2019-05-27 MED FILL — LOSARTAN POTASSIUM 50 MG TA: 50 | 30 days supply | Qty: 30 | Fill #1

## 2019-05-27 MED FILL — hydrALAZINE HCL 50 MG TABS: 50 | 30 days supply | Qty: 90 | Fill #1

## 2019-05-27 MED FILL — INDOMETHACIN 25 MG CAPSULE: 25 | 10 days supply | Qty: 20 | Fill #0

## 2019-05-27 MED FILL — ALLOPURINOL 100 MG TABLET: 100 | 30 days supply | Qty: 60 | Fill #1

## 2019-05-27 NOTE — Telephone Encounter (Signed)
Called and spoke with patient. Advised that creatinine continues to increase which indicates kidney functioning is worsening. Advised we will send over copy of labs to [facility]. [facility] is not aware of any medication changes, I have asked pt to reach out to Dr. Bishop Dublin nurse to see what last medication changes were and to make sure he is aware of everything and does they want him to take. Patient verbalized understanding. Thanks!   I have faxed over labs today 05/27/2019 @10 :38am. Thanks!

## 2019-05-27 NOTE — Telephone Encounter (Signed)
-----   Message from [name], [place] sent at 05/27/2019  6:12 AM EST ----- Regarding: lab results Please inform patient that creatinine continues to increase, which is indicative of worsening kidney functioning. He will need to follow up with nephrology. Please fax lab results to [facility]. Also, Dr. Hollie Salk made medication changes, patient has not been following because he does not remember. Please call the pharmacy to clarify.   [name]  APRN, MSN, FNP-C [facility] [address] [place], [place] [address] [phone]

## 2019-05-28 MED FILL — SODIUM BICARBONATE 650 MG T: 650 | 30 days supply | Qty: 120 | Fill #0

## 2019-06-08 ENCOUNTER — Ambulatory Visit: Payer: BC Managed Care – PPO | Admitting: Gastroenterology

## 2019-06-08 NOTE — Progress Notes (Signed)
No show letter sent.

## 2019-06-22 ENCOUNTER — Encounter: Payer: Self-pay | Admitting: Nurse Practitioner

## 2019-06-22 ENCOUNTER — Ambulatory Visit (INDEPENDENT_AMBULATORY_CARE_PROVIDER_SITE_OTHER): Payer: Self-pay | Admitting: Nurse Practitioner

## 2019-06-22 ENCOUNTER — Other Ambulatory Visit: Payer: Self-pay

## 2019-06-22 VITALS — BP 160/100 | HR 70 | Temp 99.4°F | Resp 18 | Ht 71.0 in | Wt 269.0 lb

## 2019-06-22 DIAGNOSIS — W57XXXA Bitten or stung by nonvenomous insect and other nonvenomous arthropods, initial encounter: Secondary | ICD-10-CM

## 2019-06-22 DIAGNOSIS — I10 Essential (primary) hypertension: Secondary | ICD-10-CM

## 2019-06-22 DIAGNOSIS — S30861A Insect bite (nonvenomous) of abdominal wall, initial encounter: Secondary | ICD-10-CM

## 2019-06-22 MED ORDER — DOXYCYCLINE HYCLATE 100 MG PO CAPS: 100.0000 mg | ORAL_CAPSULE | Freq: Two times a day (BID) | ORAL | 0 refills | Status: AC

## 2019-06-22 MED FILL — DOXYCYCLINE HYCLATE 100 MG: 100 | 21 days supply | Qty: 42 | Fill #0

## 2019-06-22 NOTE — Progress Notes (Signed)
Acute Office Visit  Subjective:    Patient ID: Michael [name], male    DOB: April 30, 1975, 44 y.o.   MRN: [id number]  Chief Complaint  Patient presents with  . Tick Removal    feeling tired and muscles have hurt since bite x 1 week ago   . Nausea    HPI Patient is in today for tick bite. He  has a past medical history of CKD (chronic kidney disease), Hypertension, Polycystic kidney disease, Sleep apnea, and Vitamin D deficiency (11/2018).  On 1 June 08, 2019 he was patting an unknown dog and later discovered a take for his right upper abdomen that evening.  He admits that there was swelling for 1 week in the area.  He denies a rash.  He has felt cold.  He admits that he is normally hot natured.  He has suffered from migraines over the last 2 to 3 weeks.  He has been having some phlegm/mucus.  He admits that there has been slight brownish discoloration.  He has had some shortness of breath and chest discomfort.  He also complains of a decreased appetite with some nausea and 2 episodes of vomiting.  He denies rash, fever, headache, neck stiffness, bradycardia, body aches and pains, feeling of numbness, tingling or weakness.  He deneis any weight loss.  His blood pressure is elevated today.  He admits that he did not take his medication.  He just got out of bed prior to coming to this appointment.  He admits that he will take his medication upon arrival home.  Past Medical History:  Diagnosis Date  . CKD (chronic kidney disease)   . Hypertension   . Polycystic kidney disease   . Sleep apnea   . Vitamin D deficiency 11/2018    History reviewed. No pertinent surgical history.  Family History  Problem Relation Age of Onset  . Bipolar disorder Father     Social History   Socioeconomic History  . Marital status: Single    Spouse name: Not on file  . Number of children: Not on file  . Years of education: Not on file  . Highest education level: Not on file  Occupational History  . Not  on file  Tobacco Use  . Smoking status: Former Research scientist (life sciences)  . Smokeless tobacco: Never Used  Substance and Sexual Activity  . Alcohol use: Yes    Comment: wine occ   . Drug use: No  . Sexual activity: Yes  Other Topics Concern  . Not on file  Social History Narrative  . Not on file   Social Determinants of Health   Financial Resource Strain:   . Difficulty of Paying Living Expenses:   Food Insecurity:   . Worried About Charity fundraiser in the Last Year:   . Arboriculturist in the Last Year:   Transportation Needs:   . Film/video editor (Medical):   Marland Kitchen Lack of Transportation (Non-Medical):   Physical Activity:   . Days of Exercise per Week:   . Minutes of Exercise per Session:   Stress:   . Feeling of Stress :   Social Connections:   . Frequency of Communication with Friends and Family:   . Frequency of Social Gatherings with Friends and Family:   . Attends Religious Services:   . Active Member of Clubs or Organizations:   . Attends Archivist Meetings:   Marland Kitchen Marital Status:   Intimate Partner Violence:   . Fear  of Current or Ex-Partner:   . Emotionally Abused:   Marland Kitchen Physically Abused:   . Sexually Abused:     Outpatient Medications Prior to Visit  Medication Sig Dispense Refill  . allopurinol (ZYLOPRIM) 100 MG tablet Take 1 tablet (100 mg total) by mouth 2 (two) times daily. 60 tablet 6  . APPLE CIDER VINEGAR PO Take 1 tablet by mouth daily. Goli Gummie    . atenolol (TENORMIN) 25 MG tablet Take 1 tablet (25 mg total) by mouth 2 (two) times daily. 180 tablet 1  . Flaxseed, Linseed, (FLAX SEED OIL PO) Take 5 mLs by mouth daily.    . hydrALAZINE (APRESOLINE) 50 MG tablet Take 1 tablet (50 mg total) by mouth every 8 (eight) hours. (Patient taking differently: Take 50 mg by mouth in the morning and at bedtime. ) 90 tablet 6  . indomethacin (INDOCIN) 25 MG capsule TAKE 1 CAPSULE (25 MG TOTAL) BY MOUTH 2 (TWO) TIMES DAILY WITH A MEAL. 20 capsule 0  . multivitamin  (ONE-A-DAY MEN'S) TABS tablet Take 1 tablet by mouth daily. 30 tablet 11  . losartan (COZAAR) 50 MG tablet Take 1 tablet (50 mg total) by mouth daily. (Patient not taking: Reported on 05/26/2019) 30 tablet 6   No facility-administered medications prior to visit.    No Known Allergies  Review of Systems  All other systems reviewed and are negative.      Objective:    Physical Exam Constitutional:      Appearance: He is obese.  HENT:     Head: Normocephalic.     Mouth/Throat:     Mouth: Mucous membranes are moist.     Pharynx: Oropharynx is clear.  Cardiovascular:     Rate and Rhythm: Normal rate and regular rhythm.  Pulmonary:     Effort: Pulmonary effort is normal.     Breath sounds: Normal breath sounds.  Abdominal:     Comments: Obese  Musculoskeletal:        General: Normal range of motion.     Cervical back: Normal range of motion.  Skin:    General: Skin is warm and dry.  Neurological:     Mental Status: He is oriented to person, place, and time.  Psychiatric:        Mood and Affect: Mood normal.        Behavior: Behavior normal.        Thought Content: Thought content normal.        Judgment: Judgment normal.     BP (!) 160/100 (BP Location: Left Arm, Patient Position: Sitting, Cuff Size: Large) Comment: manaully  Pulse 70   Temp 99.4 F (37.4 C) (Oral)   Resp 18   Ht 5\' 11"  (1.803 m)   Wt 269 lb (122 kg)   SpO2 99%   BMI 37.52 kg/m  Wt Readings from Last 3 Encounters:  06/22/19 269 lb (122 kg)  05/26/19 273 lb (123.8 kg)  05/15/19 275 lb (124.7 kg)    There are no preventive care reminders to display for this patient.  There are no preventive care reminders to display for this patient.   Lab Results  Component Value Date   TSH 2.730 12/12/2018   Lab Results  Component Value Date   WBC 8.7 05/15/2019   HGB 11.8 (L) 05/15/2019   HCT 37.2 (L) 05/15/2019   MCV 86.7 05/15/2019   PLT 246 05/15/2019   Lab Results  Component Value Date   NA  138 05/26/2019  K 6.3 (H) 05/26/2019   CO2 16 (L) 05/26/2019   GLUCOSE 93 05/26/2019   BUN 51 (H) 05/26/2019   CREATININE 4.62 (H) 05/26/2019   BILITOT 0.7 05/15/2019   ALKPHOS 47 05/15/2019   AST 17 05/15/2019   ALT 18 05/15/2019   PROT 7.5 05/15/2019   ALBUMIN 3.8 05/15/2019   CALCIUM 8.9 05/26/2019   ANIONGAP 9 05/15/2019   Lab Results  Component Value Date   CHOL 221 (H) 12/12/2018   Lab Results  Component Value Date   HDL 35 (L) 12/12/2018   Lab Results  Component Value Date   LDLCALC 138 (H) 12/12/2018   Lab Results  Component Value Date   TRIG 263 (H) 12/12/2018   Lab Results  Component Value Date   CHOLHDL 6.3 (H) 12/12/2018   Lab Results  Component Value Date   HGBA1C 5.7 06/19/2016       Assessment & Plan:   Mr. Mahone suffered a vagal episode while getting his blood drawn.  Problem List Items Addressed This Visit      High   Essential hypertension, benign - Primary   Relevant Orders   CBC with Differential/Platelet   Comp. Metabolic Panel (12)   Sedimentation rate    Other Visit Diagnoses    Tick bite of abdominal wall, initial encounter           Meds ordered this encounter  Medications  . doxycycline (VIBRAMYCIN) 100 MG capsule    Sig: Take 1 capsule (100 mg total) by mouth 2 (two) times daily for 21 days. Take for 14-21 days for tick bite.    Dispense:  42 capsule    Refill:  0    Order Specific Question:   Supervising Provider    Answer:   Tresa Garter [[id number]]     Vevelyn Francois, NP

## 2019-06-23 LAB — CBC WITH DIFFERENTIAL/PLATELET
Basophils Absolute: 0 10*3/uL (ref 0.0–0.2)
Basos: 0 %
EOS (ABSOLUTE): 0 10*3/uL (ref 0.0–0.4)
Eos: 0 %
Hematocrit: 36.1 % — ABNORMAL LOW (ref 37.5–51.0)
Hemoglobin: 11.9 g/dL — ABNORMAL LOW (ref 13.0–17.7)
Immature Grans (Abs): 0 10*3/uL (ref 0.0–0.1)
Immature Granulocytes: 0 %
Lymphocytes Absolute: 1.6 10*3/uL (ref 0.7–3.1)
Lymphs: 28 %
MCH: 27.4 pg (ref 26.6–33.0)
MCHC: 33 g/dL (ref 31.5–35.7)
MCV: 83 fL (ref 79–97)
Monocytes Absolute: 0.4 10*3/uL (ref 0.1–0.9)
Monocytes: 7 %
Neutrophils Absolute: 3.7 10*3/uL (ref 1.4–7.0)
Neutrophils: 65 %
Platelets: 203 10*3/uL (ref 150–450)
RBC: 4.35 x10E6/uL (ref 4.14–5.80)
RDW: 14.5 % (ref 11.6–15.4)
WBC: 5.8 10*3/uL (ref 3.4–10.8)

## 2019-06-23 LAB — COMP. METABOLIC PANEL (12)
AST: 21 IU/L (ref 0–40)
Albumin/Globulin Ratio: 1.2 (ref 1.2–2.2)
Albumin: 3.9 g/dL — ABNORMAL LOW (ref 4.0–5.0)
Alkaline Phosphatase: 51 IU/L (ref 39–117)
BUN/Creatinine Ratio: 10 (ref 9–20)
BUN: 58 mg/dL — ABNORMAL HIGH (ref 6–24)
Bilirubin Total: 0.2 mg/dL (ref 0.0–1.2)
Calcium: 7.5 mg/dL — ABNORMAL LOW (ref 8.7–10.2)
Chloride: 110 mmol/L — ABNORMAL HIGH (ref 96–106)
Creatinine, Ser: 5.56 mg/dL — ABNORMAL HIGH (ref 0.76–1.27)
GFR calc Af Amer: 13 mL/min/{1.73_m2} — ABNORMAL LOW (ref >59)
GFR calc non Af Amer: 12 mL/min/{1.73_m2} — ABNORMAL LOW (ref >59)
Globulin, Total: 3.2 g/dL (ref 1.5–4.5)
Glucose: 82 mg/dL (ref 65–99)
Potassium: 5.8 mmol/L — ABNORMAL HIGH (ref 3.5–5.2)
Sodium: 141 mmol/L (ref 134–144)
Total Protein: 7.1 g/dL (ref 6.0–8.5)

## 2019-06-23 LAB — SEDIMENTATION RATE: Sed Rate: 67 mm/hr — ABNORMAL HIGH (ref 0–15)

## 2019-07-02 ENCOUNTER — Telehealth: Payer: Self-pay

## 2019-07-02 NOTE — Telephone Encounter (Signed)
-----   Message from Vevelyn Francois, NP sent at 07/01/2019  9:27 PM EDT ----- Please send a copy of his labs highlight potassium. They were treating him for hyperkalemia.  Thanks ----- Message ----- From: Adelina Mings, LPN Sent: 12/15/5745  10:01 AM EDT To: Vevelyn Francois, NP  Hey Crystal I called pt but no answer and voicemail was full. Looking in media tab he was seeing Dr. Hollie Salk with [facility]. I called [facility] and spoke with Solmon Ice she says pt was seen last on 04/30/19 and has a follow up scheduled with Dr. Hollie Salk on 08/05/2019 @2 :15pm.  ----- Message ----- From: Vevelyn Francois, NP Sent: 07/01/2019   9:22 AM EDT To: Adelina Mings, LPN  Mickel Baas can you call Mr. Range and see who the nephrologist is that he is following up with.  I do not see any notes.  His potassium is still elevated.  We need to know when his next follow-up date because he will not be seen in the office again until September. Thanks

## 2019-07-02 NOTE — Telephone Encounter (Signed)
Faxed to [facility] today 07/02/2019 and have written a note of fax cover sheet for them to pay close attention to the potassium level. Thanks!

## 2019-07-13 MED FILL — LOSARTAN POTASSIUM 50 MG TA: 50 | 30 days supply | Qty: 30 | Fill #2

## 2019-08-07 MED FILL — LOSARTAN POTASSIUM 50 MG TA: 50 | 30 days supply | Qty: 30 | Fill #3

## 2019-09-07 MED FILL — LOSARTAN POTASSIUM 50 MG TA: 50 | 30 days supply | Qty: 30 | Fill #3

## 2019-10-07 MED FILL — LOSARTAN POTASSIUM 50 MG TA: 50 | 30 days supply | Qty: 30 | Fill #4

## 2019-10-07 MED FILL — hydrALAZINE HCL 50 MG TABS: 50 | 30 days supply | Qty: 90 | Fill #2

## 2019-10-07 MED FILL — ALLOPURINOL 100 MG TABLET: 100 | 30 days supply | Qty: 60 | Fill #3

## 2019-10-19 ENCOUNTER — Other Ambulatory Visit: Payer: Self-pay

## 2019-10-19 ENCOUNTER — Ambulatory Visit (INDEPENDENT_AMBULATORY_CARE_PROVIDER_SITE_OTHER): Payer: Self-pay | Admitting: Nurse Practitioner

## 2019-10-19 ENCOUNTER — Encounter: Payer: Self-pay | Admitting: Nurse Practitioner

## 2019-10-19 VITALS — BP 175/99 | HR 86 | Temp 98.6°F | Resp 16 | Ht 71.0 in | Wt 270.0 lb

## 2019-10-19 DIAGNOSIS — L309 Dermatitis, unspecified: Secondary | ICD-10-CM

## 2019-10-19 DIAGNOSIS — I10 Essential (primary) hypertension: Secondary | ICD-10-CM

## 2019-10-19 MED ORDER — HYDROCORTISONE 1 % EX OINT: 1.0000 "application " | TOPICAL_OINTMENT | Freq: Two times a day (BID) | CUTANEOUS | 0 refills | Status: DC

## 2019-10-19 NOTE — Patient Instructions (Addendum)
Managing Your Hypertension Hypertension is commonly called high blood pressure. This is when the force of your blood pressing against the walls of your arteries is too strong. Arteries are blood vessels that carry blood from your heart throughout your body. Hypertension forces the heart to work harder to pump blood, and may cause the arteries to become narrow or stiff. Having untreated or uncontrolled hypertension can cause heart attack, stroke, kidney disease, and other problems. What are blood pressure readings? A blood pressure reading consists of a higher number over a lower number. Ideally, your blood pressure should be below 120/80. The first ("top") number is called the systolic pressure. It is a measure of the pressure in your arteries as your heart beats. The second ("bottom") number is called the diastolic pressure. It is a measure of the pressure in your arteries as the heart relaxes. What does my blood pressure reading mean? Blood pressure is classified into four stages. Based on your blood pressure reading, your health care provider may use the following stages to determine what type of treatment you need, if any. Systolic pressure and diastolic pressure are measured in a unit called mm Hg. Normal  Systolic pressure: below 120.  Diastolic pressure: below 80. Elevated  Systolic pressure: 120-129.  Diastolic pressure: below 80. Hypertension stage 1  Systolic pressure: 130-139.  Diastolic pressure: 80-89. Hypertension stage 2  Systolic pressure: 140 or above.  Diastolic pressure: 90 or above. What health risks are associated with hypertension? Managing your hypertension is an important responsibility. Uncontrolled hypertension can lead to:  A heart attack.  A stroke.  A weakened blood vessel (aneurysm).  Heart failure.  Kidney damage.  Eye damage.  Metabolic syndrome.  Memory and concentration problems. What changes can I make to manage my  hypertension? Hypertension can be managed by making lifestyle changes and possibly by taking medicines. Your health care provider will help you make a plan to bring your blood pressure within a normal range. Eating and drinking   Eat a diet that is high in fiber and potassium, and low in salt (sodium), added sugar, and fat. An example eating plan is called the DASH (Dietary Approaches to Stop Hypertension) diet. To eat this way: ? Eat plenty of fresh fruits and vegetables. Try to fill half of your plate at each meal with fruits and vegetables. ? Eat whole grains, such as whole wheat pasta, brown rice, or whole grain bread. Fill about one quarter of your plate with whole grains. ? Eat low-fat diary products. ? Avoid fatty cuts of meat, processed or cured meats, and poultry with skin. Fill about one quarter of your plate with lean proteins such as fish, chicken without skin, beans, eggs, and tofu. ? Avoid premade and processed foods. These tend to be higher in sodium, added sugar, and fat.  Reduce your daily sodium intake. Most people with hypertension should eat less than 1,500 mg of sodium a day.  Limit alcohol intake to no more than 1 drink a day for nonpregnant women and 2 drinks a day for men. One drink equals 12 oz of beer, 5 oz of wine, or 1 oz of hard liquor. Lifestyle  Work with your health care provider to maintain a healthy body weight, or to lose weight. Ask what an ideal weight is for you.  Get at least 30 minutes of exercise that causes your heart to beat faster (aerobic exercise) most days of the week. Activities may include walking, swimming, or biking.  Include exercise   to strengthen your muscles (resistance exercise), such as weight lifting, as part of your weekly exercise routine. Try to do these types of exercises for 30 minutes at least 3 days a week.  Do not use any products that contain nicotine or tobacco, such as cigarettes and e-cigarettes. If you need help quitting,  ask your health care provider.  Control any long-term (chronic) conditions you have, such as high cholesterol or diabetes. Monitoring  Monitor your blood pressure at home as told by your health care provider. Your personal target blood pressure may vary depending on your medical conditions, your age, and other factors.  Have your blood pressure checked regularly, as often as told by your health care provider. Working with your health care provider  Review all the medicines you take with your health care provider because there may be side effects or interactions.  Talk with your health care provider about your diet, exercise habits, and other lifestyle factors that may be contributing to hypertension.  Visit your health care provider regularly. Your health care provider can help you create and adjust your plan for managing hypertension. Will I need medicine to control my blood pressure? Your health care provider may prescribe medicine if lifestyle changes are not enough to get your blood pressure under control, and if:  Your systolic blood pressure is 130 or higher.  Your diastolic blood pressure is 80 or higher. Take medicines only as told by your health care provider. Follow the directions carefully. Blood pressure medicines must be taken as prescribed. The medicine does not work as well when you skip doses. Skipping doses also puts you at risk for problems. Contact a health care provider if:  You think you are having a reaction to medicines you have taken.  You have repeated (recurrent) headaches.  You feel dizzy.  You have swelling in your ankles.  You have trouble with your vision. Get help right away if:  You develop a severe headache or confusion.  You have unusual weakness or numbness, or you feel faint.  You have severe pain in your chest or abdomen.  You vomit repeatedly.  You have trouble breathing. Summary  Hypertension is when the force of blood pumping  through your arteries is too strong. If this condition is not controlled, it may put you at risk for serious complications.  Your personal target blood pressure may vary depending on your medical conditions, your age, and other factors. For most people, a normal blood pressure is less than 120/80.  Hypertension is managed by lifestyle changes, medicines, or both. Lifestyle changes include weight loss, eating a healthy, low-sodium diet, exercising more, and limiting alcohol. This information is not intended to replace advice given to you by your health care provider. Make sure you discuss any questions you have with your health care provider. Document Revised: 06/27/2018 Document Reviewed: 02/01/2016 Elsevier Patient Education  [place].  Eczema Eczema is a broad term for a group of skin conditions that cause skin to become rough and inflamed. Each type of eczema has different triggers, symptoms, and treatments. Eczema of any type is usually itchy and symptoms range from mild to severe. Eczema and its symptoms are not spread from person to person (are not contagious). It can appear on different parts of the body at different times. Your eczema may not look the same as someone else's eczema. What are the types of eczema? Atopic dermatitis This is a long-term (chronic) skin disease that keeps coming back (recurring). Usual  symptoms are dry skin and small, solid pimples that may swell and leak fluid (weep). Contact dermatitis  This happens when something irritates the skin and causes a rash. The irritation can come from substances that you are allergic to (allergens), such as poison ivy, chemicals, or medicines that were applied to your skin. Dyshidrotic eczema This is a form of eczema on the hands and feet. It shows up as very itchy, fluid-filled blisters. It can affect people of any age, but is more common before age 55. Hand eczema  This causes very itchy areas of skin on the palms and  sides of the hands and fingers. This type of eczema is common in industrial jobs where you may be exposed to many different types of irritants. Lichen simplex chronicus This type of eczema occurs when a person constantly scratches one area of the body. Repeated scratching of the area leads to thickened skin (lichenification). Lichen simplex chronicus can occur along with other types of eczema. It is more common in adults, but may be seen in children as well. Nummular eczema This is a common type of eczema. It has no known cause. It typically causes a red, circular, crusty lesion (plaque) that may be itchy. Scratching may become a habit and can cause bleeding. Nummular eczema occurs most often in people of middle-age or older. It most often affects the hands. Seborrheic dermatitis This is a common skin disease that mainly affects the scalp. It may also affect any oily areas of the body, such as the face, sides of nose, eyebrows, ears, eyelids, and chest. It is marked by small scaling and redness of the skin (erythema). This can affect people of all ages. In infants, this condition is known as Chartered certified accountant." Stasis dermatitis This is a common skin disease that usually appears on the legs and feet. It most often occurs in people who have a condition that prevents blood from being pumped through the veins in the legs (chronic venous insufficiency). Stasis dermatitis is a chronic condition that needs long-term management. How is eczema diagnosed? Your health care provider will examine your skin and review your medical history. He or she may also give you skin patch tests. These tests involve taking patches that contain possible allergens and placing them on your back. He or she will then check in a few days to see if an allergic reaction occurred. What are the common treatments? Treatment for eczema is based on the type of eczema you have. Hydrocortisone steroid medicine can relieve itching quickly and help  reduce inflammation. This medicine may be prescribed or obtained over-the-counter, depending on the strength of the medicine that is needed. Follow these instructions at home:  Take over-the-counter and prescription medicines only as told by your health care provider.  Use creams or ointments to moisturize your skin. Do not use lotions.  Learn what triggers or irritates your symptoms. Avoid these things.  Treat symptom flare-ups quickly.  Do not itch your skin. This can make your rash worse.  Keep all follow-up visits as told by your health care provider. This is important. Where to find more information  The American Academy of Dermatology: http://jones-macias.info/  The National Eczema Association: www.nationaleczema.org Contact a health care provider if:  You have serious itching, even with treatment.  You regularly scratch your skin until it bleeds.  Your rash looks different than usual.  Your skin is painful, swollen, or more red than usual.  You have a fever. Summary  There are eight  general types of eczema. Each type has different triggers.  Eczema of any type causes itching that may range from mild to severe.  Treatment varies based on the type of eczema you have. Hydrocortisone steroid medicine can help with itching and inflammation.  Protecting your skin is the best way to prevent eczema. Use moisturizers and lotions. Avoid triggers and irritants, and treat flare-ups quickly. This information is not intended to replace advice given to you by your health care provider. Make sure you discuss any questions you have with your health care provider. Document Revised: 02/15/2017 Document Reviewed: 07/19/2016 Elsevier Patient Education  [place].

## 2019-10-19 NOTE — Progress Notes (Signed)
[address] [place], [place]  [address] Phone:  [phone]   Fax:  [phone]  Acute Office Visit  Subjective:    Patient ID: Michael [name], male    DOB: 03/11/1976, 44 y.o.   MRN: [id number]  Chief Complaint  Patient presents with  . Eczema    on knees and legs     HPI Patient is in today for acute visit. He  has a past medical history of CKD (chronic kidney disease), Hypertension, Polycystic kidney disease, Sleep apnea, and Vitamin D deficiency (11/2018).   Eczema Symptoms have been ongoing for about 4 weeks and have been stable. His eczema affects the knees. He has been experiencing itching. Overall, symptoms have been mild, moderate. Treatments tried so far include Eucerin , with fair improvement. He denies any raised areas on his skin.  Hypertension Patient is here for an acute visit but his BP is elevated. 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 He failed to take his medication this morning because he has been away from home.  He is not exercising and is adherent to a low-salt diet. Blood pressure is not monitoring at home. Cardiac symptoms: none. Patient denies chest pain, claudication, fatigue, irregular heartbeat, lower extremity edema, palpitations and syncope. Cardiovascular risk factors: hypertension, male gender, obesity (BMI >= 30 kg/m2) and sedentary lifestyle. Use of agents associated with hypertension: NSAIDS. History of target organ damage: CKD, OSA  Past Medical History:  Diagnosis Date  . CKD (chronic kidney disease)   . Hypertension   . Polycystic kidney disease   . Sleep apnea   . Vitamin D deficiency 11/2018    History reviewed. No pertinent surgical history.  Family History  Problem Relation Age of Onset  . Bipolar disorder Father     Social History   Socioeconomic History  . Marital status: Single    Spouse name: Not on file  . Number of children: Not on file  . Years of  education: Not on file  . Highest education level: Not on file  Occupational History  . Not on file  Tobacco Use  . Smoking status: Former Research scientist (life sciences)  . Smokeless tobacco: Never Used  Vaping Use  . Vaping Use: Never used  Substance and Sexual Activity  . Alcohol use: Yes    Comment: wine occ   . Drug use: No  . Sexual activity: Yes  Other Topics Concern  . Not on file  Social History Narrative  . Not on file   Social Determinants of Health   Financial Resource Strain:   . Difficulty of Paying Living Expenses:   Food Insecurity:   . Worried About Charity fundraiser in the Last Year:   . Arboriculturist in the Last Year:   Transportation Needs:   . Film/video editor (Medical):   Marland Kitchen Lack of Transportation (Non-Medical):   Physical Activity:   . Days of Exercise per Week:   . Minutes of Exercise per Session:   Stress:   . Feeling of Stress :   Social Connections:   . Frequency of Communication with Friends and Family:   . Frequency of Social Gatherings with Friends and Family:   . Attends Religious Services:   . Active Member of Clubs or Organizations:   . Attends Archivist Meetings:   Marland Kitchen Marital Status:   Intimate Partner Violence:   . Fear of Current or  Ex-Partner:   . Emotionally Abused:   Marland Kitchen Physically Abused:   . Sexually Abused:     Outpatient Medications Prior to Visit  Medication Sig Dispense Refill  . allopurinol (ZYLOPRIM) 100 MG tablet Take 1 tablet (100 mg total) by mouth 2 (two) times daily. 60 tablet 6  . APPLE CIDER VINEGAR PO Take 1 tablet by mouth daily. [place] Gummie    . atenolol (TENORMIN) 25 MG tablet Take 1 tablet (25 mg total) by mouth 2 (two) times daily. 180 tablet 1  . hydrALAZINE (APRESOLINE) 50 MG tablet Take 1 tablet (50 mg total) by mouth every 8 (eight) hours. (Patient taking differently: Take 50 mg by mouth in the morning and at bedtime. ) 90 tablet 6  . indomethacin (INDOCIN) 25 MG capsule TAKE 1 CAPSULE (25 MG TOTAL) BY MOUTH  2 (TWO) TIMES DAILY WITH A MEAL. 20 capsule 0  . losartan (COZAAR) 50 MG tablet Take 1 tablet (50 mg total) by mouth daily. 30 tablet 6  . multivitamin (ONE-A-DAY MEN'S) TABS tablet Take 1 tablet by mouth daily. 30 tablet 11  . Flaxseed, Linseed, (FLAX SEED OIL PO) Take 5 mLs by mouth daily. (Patient not taking: Reported on 10/19/2019)     No facility-administered medications prior to visit.    No Known Allergies  Review of Systems  All other systems reviewed and are negative.      Objective:    Physical Exam Constitutional:      General: He is not in acute distress.    Appearance: He is obese. He is not ill-appearing, toxic-appearing or diaphoretic.  HENT:     Head: Normocephalic.     Nose: Nose normal.     Mouth/Throat:     Mouth: Mucous membranes are moist.  Cardiovascular:     Rate and Rhythm: Normal rate.     Pulses: Normal pulses.  Pulmonary:     Effort: Pulmonary effort is normal.  Musculoskeletal:        General: Normal range of motion.     Cervical back: Normal range of motion.  Skin:    General: Skin is warm and dry.  Neurological:     General: No focal deficit present.     Mental Status: He is alert and oriented to person, place, and time.  Psychiatric:        Mood and Affect: Mood normal.        Behavior: Behavior normal.        Thought Content: Thought content normal.        Judgment: Judgment normal.     BP (!) 175/99 (BP Location: Left Wrist, Patient Position: Sitting, Cuff Size: Normal)   Pulse 86   Temp 98.6 F (37 C) (Oral)   Resp 16   Ht 5\' 11"  (1.803 m)   Wt 270 lb (122.5 kg)   SpO2 100%   BMI 37.66 kg/m  Wt Readings from Last 3 Encounters:  10/19/19 270 lb (122.5 kg)  06/22/19 269 lb (122 kg)  05/26/19 273 lb (123.8 kg)    Health Maintenance Due  Topic Date Due  . COVID-19 Vaccine (1) Never done    There are no preventive care reminders to display for this patient.   Lab Results  Component Value Date   TSH 2.730 12/12/2018    Lab Results  Component Value Date   WBC 5.8 06/22/2019   HGB 11.9 (L) 06/22/2019   HCT 36.1 (L) 06/22/2019   MCV 83 06/22/2019   PLT 203  06/22/2019   Lab Results  Component Value Date   NA 141 06/22/2019   K 5.8 (H) 06/22/2019   CO2 16 (L) 05/26/2019   GLUCOSE 82 06/22/2019   BUN 58 (H) 06/22/2019   CREATININE 5.56 (H) 06/22/2019   BILITOT 0.2 06/22/2019   ALKPHOS 51 06/22/2019   AST 21 06/22/2019   ALT 18 05/15/2019   PROT 7.1 06/22/2019   ALBUMIN 3.9 (L) 06/22/2019   CALCIUM 7.5 (L) 06/22/2019   ANIONGAP 9 05/15/2019   Lab Results  Component Value Date   CHOL 221 (H) 12/12/2018   Lab Results  Component Value Date   HDL 35 (L) 12/12/2018   Lab Results  Component Value Date   LDLCALC 138 (H) 12/12/2018   Lab Results  Component Value Date   TRIG 263 (H) 12/12/2018   Lab Results  Component Value Date   CHOLHDL 6.3 (H) 12/12/2018   Lab Results  Component Value Date   HGBA1C 5.7 06/19/2016       Assessment & Plan:   Problem List Items Addressed This Visit      Cardiovascular and Mediastinum   Hypertension Encouraged on going compliance with current medication regimen Encourage home monitoring and recording BP <130/80 Eating a heart-healthy diet with less salt Encouraged regular physical activity  Recommend Weight loss     Other Visit Diagnoses    Eczema, unspecified type    -  Primary  Use creams or ointments to moisturize your skin.   Learn what triggers or irritates your symptoms. Avoid these things.  Treat symptom flare-ups quickly.  Do not itch your skin. This can make your rash worse.       Meds ordered this encounter  Medications  . hydrocortisone 1 % ointment    Sig: Apply 1 application topically 2 (two) times daily.    Dispense:  30 g    Refill:  0    Order Specific Question:   Supervising Provider    Answer:   Tresa Garter [[id number]]     Vevelyn Francois, NP

## 2019-11-24 ENCOUNTER — Other Ambulatory Visit: Payer: Self-pay

## 2019-11-24 ENCOUNTER — Ambulatory Visit (INDEPENDENT_AMBULATORY_CARE_PROVIDER_SITE_OTHER): Payer: Medicaid Other | Admitting: Family Medicine

## 2019-11-24 ENCOUNTER — Encounter: Payer: Self-pay | Admitting: Family Medicine

## 2019-11-24 ENCOUNTER — Other Ambulatory Visit: Payer: Self-pay | Admitting: Family Medicine

## 2019-11-24 VITALS — BP 160/96 | HR 72 | Temp 97.6°F | Ht 71.0 in | Wt 269.0 lb

## 2019-11-24 DIAGNOSIS — M1A30X0 Chronic gout due to renal impairment, unspecified site, without tophus (tophi): Secondary | ICD-10-CM

## 2019-11-24 DIAGNOSIS — R739 Hyperglycemia, unspecified: Secondary | ICD-10-CM | POA: Diagnosis not present

## 2019-11-24 DIAGNOSIS — N1832 Chronic kidney disease, stage 3b: Secondary | ICD-10-CM

## 2019-11-24 DIAGNOSIS — I10 Essential (primary) hypertension: Secondary | ICD-10-CM

## 2019-11-24 LAB — POCT GLYCOSYLATED HEMOGLOBIN (HGB A1C)
HbA1c POC (<> result, manual entry): 5.6 % (ref 4.0–5.6)
HbA1c, POC (controlled diabetic range): 5.6 % (ref 0.0–7.0)
HbA1c, POC (prediabetic range): 5.6 % — AB (ref 5.7–6.4)
Hemoglobin A1C: 5.6 % (ref 4.0–5.6)

## 2019-11-24 MED ORDER — ALLOPURINOL 100 MG PO TABS: 100.0000 mg | ORAL_TABLET | Freq: Two times a day (BID) | ORAL | 6 refills | Status: DC

## 2019-11-24 MED ORDER — LOSARTAN POTASSIUM 50 MG PO TABS: 50.0000 mg | ORAL_TABLET | Freq: Every day | ORAL | 6 refills | Status: DC

## 2019-11-24 MED ORDER — INDOMETHACIN 25 MG PO CAPS: 25.0000 mg | ORAL_CAPSULE | Freq: Two times a day (BID) | ORAL | 0 refills | Status: DC

## 2019-11-24 MED ORDER — CLONIDINE HCL 0.1 MG PO TABS: 0.2000 mg | ORAL_TABLET | Freq: Once | ORAL | Status: DC

## 2019-11-24 MED ORDER — ATENOLOL 25 MG PO TABS: 25.0000 mg | ORAL_TABLET | Freq: Two times a day (BID) | ORAL | 1 refills | Status: DC

## 2019-11-24 MED FILL — LOSARTAN POTASSIUM 50 MG TA: 50 | 30 days supply | Qty: 30 | Fill #0

## 2019-11-24 MED FILL — INDOMETHACIN 25 MG CAPSULE: 25 | 10 days supply | Qty: 20 | Fill #0

## 2019-11-24 MED FILL — ALLOPURINOL 100 MG TABLET: 100 | 30 days supply | Qty: 60 | Fill #0

## 2019-11-24 MED FILL — ATENOLOL 25 MG TABLET: 25 | 30 days supply | Qty: 60 | Fill #0

## 2019-11-24 NOTE — Progress Notes (Addendum)
[name]   Established Patient Office Visit  Subjective:  Patient ID: Hau Sanor, male    DOB: 12-24-1975  Age: 44 y.o. MRN: [id number]  CC:  Chief Complaint  Patient presents with  . Follow-up    having nausea in the morning, not able to sleep good at night    HPI Leanord Thibeau is a 44 year old male with a medical history significant for accelerated hypertension, acquired gout, and stage 3b chronic kidney disease presents for a follow up of chronic conditions.  Mr. Streett says that he was taking medications consistently prior to 1 week ago. He has been under a great deal of stress due to financial constraints. Also, he says that he has "not been feeling well" since he sustained a tick bite..  He has not been checking blood pressure at home.  He has a long history of stage IIIb chronic kidney disease, he has been lost to follow-up with nephrology due to insurance constraints.  Patient's most recent creatinine level was graeater than 5. During previous nephrology visit, blood pressure medications were changed due to accelerated hypertension.Patient endorses some nausea upon awakening.  He says that nausea typically improves following a bowel movement.  Patient also reports having a difficult time falling asleep and staying asleep on some nights.  He has not been napping during the day.  He typically sleeps 5 to 6 hours per night.    Past Medical History:  Diagnosis Date  . CKD (chronic kidney disease)   . Hypertension   . Polycystic kidney disease   . Sleep apnea   . Vitamin D deficiency 11/2018    No past surgical history on file.  Family History  Problem Relation Age of Onset  . Bipolar disorder Father     Social History   Socioeconomic History  . Marital status: Single    Spouse name: Not on file  . Number of children: Not on file  . Years of education: Not on file  . Highest education level: Not on file  Occupational  History  . Not on file  Tobacco Use  . Smoking status: Former Research scientist (life sciences)  . Smokeless tobacco: Never Used  Vaping Use  . Vaping Use: Never used  Substance and Sexual Activity  . Alcohol use: Yes    Comment: wine occ   . Drug use: No  . Sexual activity: Yes  Other Topics Concern  . Not on file  Social History Narrative  . Not on file   Social Determinants of Health   Financial Resource Strain:   . Difficulty of Paying Living Expenses: Not on file  Food Insecurity:   . Worried About Charity fundraiser in the Last Year: Not on file  . Ran Out of Food in the Last Year: Not on file  Transportation Needs:   . Lack of Transportation (Medical): Not on file  . Lack of Transportation (Non-Medical): Not on file  Physical Activity:   . Days of Exercise per Week: Not on file  . Minutes of Exercise per Session: Not on file  Stress:   . Feeling of Stress : Not on file  Social Connections:   . Frequency of Communication with Friends and Family: Not on file  . Frequency of Social Gatherings with Friends and Family: Not on file  . Attends Religious Services: Not on file  . Active Member of Clubs or Organizations: Not on file  . Attends Archivist  Meetings: Not on file  . Marital Status: Not on file  Intimate Partner Violence:   . Fear of Current or Ex-Partner: Not on file  . Emotionally Abused: Not on file  . Physically Abused: Not on file  . Sexually Abused: Not on file    Outpatient Medications Prior to Visit  Medication Sig Dispense Refill  . APPLE CIDER VINEGAR PO Take 1 tablet by mouth daily. [place] Gummie    . hydrALAZINE (APRESOLINE) 50 MG tablet Take 1 tablet (50 mg total) by mouth every 8 (eight) hours. (Patient taking differently: Take 50 mg by mouth in the morning and at bedtime. ) 90 tablet 6  . hydrocortisone 1 % ointment Apply 1 application topically 2 (two) times daily. 30 g 0  . multivitamin (ONE-A-DAY MEN'S) TABS tablet Take 1 tablet by mouth daily. 30 tablet 11   . allopurinol (ZYLOPRIM) 100 MG tablet Take 1 tablet (100 mg total) by mouth 2 (two) times daily. 60 tablet 6  . atenolol (TENORMIN) 25 MG tablet Take 1 tablet (25 mg total) by mouth 2 (two) times daily. 180 tablet 1  . indomethacin (INDOCIN) 25 MG capsule TAKE 1 CAPSULE (25 MG TOTAL) BY MOUTH 2 (TWO) TIMES DAILY WITH A MEAL. 20 capsule 0  . losartan (COZAAR) 50 MG tablet Take 1 tablet (50 mg total) by mouth daily. 30 tablet 6   No facility-administered medications prior to visit.    No Known Allergies  ROS Review of Systems  Constitutional: Negative.   HENT: Negative.   Eyes: Negative.  Negative for visual disturbance.  Respiratory: Negative.   Cardiovascular: Negative.   Gastrointestinal: Negative.   Endocrine: Negative for polydipsia, polyphagia and polyuria.  Genitourinary: Negative.   Musculoskeletal: Negative.   Skin: Negative.   Neurological: Positive for numbness. Negative for seizures, speech difficulty and headaches.  Psychiatric/Behavioral: Negative.       Objective:    Physical Exam Constitutional:      Appearance: He is obese.  HENT:     Head: Normocephalic.  Cardiovascular:     Rate and Rhythm: Normal rate and regular rhythm.  Pulmonary:     Effort: Pulmonary effort is normal.     Breath sounds: Normal breath sounds.  Abdominal:     General: Abdomen is flat. Bowel sounds are normal.  Musculoskeletal:        General: Normal range of motion.  Skin:    General: Skin is warm.  Neurological:     General: No focal deficit present.     Mental Status: Mental status is at baseline.  Psychiatric:        Mood and Affect: Mood normal.        Behavior: Behavior normal.        Thought Content: Thought content normal.        Judgment: Judgment normal.     BP (!) 180/103   Pulse 72   Temp 97.6 F (36.4 C) (Temporal)   Ht 5\' 11"  (1.803 m)   Wt 269 lb (122 kg)   SpO2 98%   BMI 37.52 kg/m  Wt Readings from Last 3 Encounters:  11/24/19 269 lb (122 kg)    10/19/19 270 lb (122.5 kg)  06/22/19 269 lb (122 kg)     Health Maintenance Due  Topic Date Due  . COVID-19 Vaccine (1) Never done    There are no preventive care reminders to display for this patient.  Lab Results  Component Value Date   TSH 2.730 12/12/2018  Lab Results  Component Value Date   WBC 5.8 06/22/2019   HGB 11.9 (L) 06/22/2019   HCT 36.1 (L) 06/22/2019   MCV 83 06/22/2019   PLT 203 06/22/2019   Lab Results  Component Value Date   NA 141 06/22/2019   K 5.8 (H) 06/22/2019   CO2 16 (L) 05/26/2019   GLUCOSE 82 06/22/2019   BUN 58 (H) 06/22/2019   CREATININE 5.56 (H) 06/22/2019   BILITOT 0.2 06/22/2019   ALKPHOS 51 06/22/2019   AST 21 06/22/2019   ALT 18 05/15/2019   PROT 7.1 06/22/2019   ALBUMIN 3.9 (L) 06/22/2019   CALCIUM 7.5 (L) 06/22/2019   ANIONGAP 9 05/15/2019   Lab Results  Component Value Date   CHOL 221 (H) 12/12/2018   Lab Results  Component Value Date   HDL 35 (L) 12/12/2018   Lab Results  Component Value Date   LDLCALC 138 (H) 12/12/2018   Lab Results  Component Value Date   TRIG 263 (H) 12/12/2018   Lab Results  Component Value Date   CHOLHDL 6.3 (H) 12/12/2018   Lab Results  Component Value Date   HGBA1C 5.7 06/19/2016      Assessment & Plan:   Problem List Items Addressed This Visit      Cardiovascular and Mediastinum   Essential hypertension, benign   Relevant Medications   losartan (COZAAR) 50 MG tablet   atenolol (TENORMIN) 25 MG tablet     Other   Chronic gout due to renal impairment without tophus   Relevant Medications   allopurinol (ZYLOPRIM) 100 MG tablet   indomethacin (INDOCIN) 25 MG capsule    Other Visit Diagnoses    Acute gout due to renal impairment involving right ankle       Relevant Medications   allopurinol (ZYLOPRIM) 100 MG tablet   indomethacin (INDOCIN) 25 MG capsule      Meds ordered this encounter  Medications  . allopurinol (ZYLOPRIM) 100 MG tablet    Sig: Take 1 tablet  (100 mg total) by mouth 2 (two) times daily.    Dispense:  60 tablet    Refill:  6  . losartan (COZAAR) 50 MG tablet    Sig: Take 1 tablet (50 mg total) by mouth daily.    Dispense:  30 tablet    Refill:  6  . indomethacin (INDOCIN) 25 MG capsule    Sig: Take 1 capsule (25 mg total) by mouth 2 (two) times daily with a meal.    Dispense:  20 capsule    Refill:  0  . atenolol (TENORMIN) 25 MG tablet    Sig: Take 1 tablet (25 mg total) by mouth 2 (two) times daily.    Dispense:  180 tablet    Refill:  1   Chronic gout due to renal impairment without tophus, unspecified site - allopurinol (ZYLOPRIM) 100 MG tablet; Take 1 tablet (100 mg total) by mouth 2 (two) times daily.  Dispense: 60 tablet; Refill: 6 - indomethacin (INDOCIN) 25 MG capsule; Take 1 capsule (25 mg total) by mouth 2 (two) times daily with a meal.  Dispense: 20 capsule; Refill: 0 - Uric Acid  Essential hypertension, benign Blood pressure was markedly elevated on arrival.  Blood pressure was 180/103 upon arrival.  Administered clonidine 0.2 mg x 1 dose.  Blood pressure improved to 160/96 after 30 minutes.  Discussed the importance of taking all medications consistently in order to achieve positive outcomes.  Patient will return to clinic in 1  week for a blood pressure check. - losartan (COZAAR) 50 MG tablet; Take 1 tablet (50 mg total) by mouth daily.  Dispense: 30 tablet; Refill: 6 - atenolol (TENORMIN) 25 MG tablet; Take 1 tablet (25 mg total) by mouth 2 (two) times daily.  Dispense: 180 tablet; Refill: 1 - Comprehensive metabolic panel  Stage 3b chronic kidney disease Discussed returning to nephrology.  Patient needs a first available appointment.  Will forward creatinine results to [facility]. Discontinue indocin due to worsening kidney functioning - Comprehensive metabolic panel   Accelerated hypertension Refer to above - cloNIDine (CATAPRES) tablet 0.2 mg  Hyperglycemia - HgB A1c  Morbid  obesity ([place]) The patient is asked to make an attempt to improve diet and exercise patterns to aid in medical management of this problem.  Follow-up: Return in about 1 month (around 12/24/2019) for hypertension.  Nurse visit in 1 week for bp check. Will follow up by phone with laboratory results.   [name]  APRN, MSN, FNP-C [facility] [address] [place], [place] [address] [phone]

## 2019-11-24 NOTE — Patient Instructions (Signed)
   Managing Your Hypertension Hypertension is commonly called high blood pressure. This is when the force of your blood pressing against the walls of your arteries is too strong. Arteries are blood vessels that carry blood from your heart throughout your body. Hypertension forces the heart to work harder to pump blood, and may cause the arteries to become narrow or stiff. Having untreated or uncontrolled hypertension can cause heart attack, stroke, kidney disease, and other problems. What are blood pressure readings? A blood pressure reading consists of a higher number over a lower number. Ideally, your blood pressure should be below 120/80. The first ("top") number is called the systolic pressure. It is a measure of the pressure in your arteries as your heart beats. The second ("bottom") number is called the diastolic pressure. It is a measure of the pressure in your arteries as the heart relaxes. What does my blood pressure reading mean? Blood pressure is classified into four stages. Based on your blood pressure reading, your health care provider may use the following stages to determine what type of treatment you need, if any. Systolic pressure and diastolic pressure are measured in a unit called mm Hg. Normal  Systolic pressure: below 120.  Diastolic pressure: below 80. Elevated  Systolic pressure: 120-129.  Diastolic pressure: below 80. Hypertension stage 1  Systolic pressure: 130-139.  Diastolic pressure: 80-89. Hypertension stage 2  Systolic pressure: 140 or above.  Diastolic pressure: 90 or above. What health risks are associated with hypertension? Managing your hypertension is an important responsibility. Uncontrolled hypertension can lead to:  A heart attack.  A stroke.  A weakened blood vessel (aneurysm).  Heart failure.  Kidney damage.  Eye damage.  Metabolic syndrome.  Memory and concentration problems. What changes can I make to manage my  hypertension? Hypertension can be managed by making lifestyle changes and possibly by taking medicines. Your health care provider will help you make a plan to bring your blood pressure within a normal range. Eating and drinking   Eat a diet that is high in fiber and potassium, and low in salt (sodium), added sugar, and fat. An example eating plan is called the DASH (Dietary Approaches to Stop Hypertension) diet. To eat this way: ? Eat plenty of fresh fruits and vegetables. Try to fill half of your plate at each meal with fruits and vegetables. ? Eat whole grains, such as whole wheat pasta, brown rice, or whole grain bread. Fill about one quarter of your plate with whole grains. ? Eat low-fat diary products. ? Avoid fatty cuts of meat, processed or cured meats, and poultry with skin. Fill about one quarter of your plate with lean proteins such as fish, chicken without skin, beans, eggs, and tofu. ? Avoid premade and processed foods. These tend to be higher in sodium, added sugar, and fat.  Reduce your daily sodium intake. Most people with hypertension should eat less than 1,500 mg of sodium a day.  Limit alcohol intake to no more than 1 drink a day for nonpregnant women and 2 drinks a day for men. One drink equals 12 oz of beer, 5 oz of wine, or 1 oz of hard liquor. Lifestyle  Work with your health care provider to maintain a healthy body weight, or to lose weight. Ask what an ideal weight is for you.  Get at least 30 minutes of exercise that causes your heart to beat faster (aerobic exercise) most days of the week. Activities may include walking, swimming, or biking.    Include exercise to strengthen your muscles (resistance exercise), such as weight lifting, as part of your weekly exercise routine. Try to do these types of exercises for 30 minutes at least 3 days a week.  Do not use any products that contain nicotine or tobacco, such as cigarettes and e-cigarettes. If you need help quitting,  ask your health care provider.  Control any long-term (chronic) conditions you have, such as high cholesterol or diabetes. Monitoring  Monitor your blood pressure at home as told by your health care provider. Your personal target blood pressure may vary depending on your medical conditions, your age, and other factors.  Have your blood pressure checked regularly, as often as told by your health care provider. Working with your health care provider  Review all the medicines you take with your health care provider because there may be side effects or interactions.  Talk with your health care provider about your diet, exercise habits, and other lifestyle factors that may be contributing to hypertension.  Visit your health care provider regularly. Your health care provider can help you create and adjust your plan for managing hypertension. Will I need medicine to control my blood pressure? Your health care provider may prescribe medicine if lifestyle changes are not enough to get your blood pressure under control, and if:  Your systolic blood pressure is 130 or higher.  Your diastolic blood pressure is 80 or higher. Take medicines only as told by your health care provider. Follow the directions carefully. Blood pressure medicines must be taken as prescribed. The medicine does not work as well when you skip doses. Skipping doses also puts you at risk for problems. Contact a health care provider if:  You think you are having a reaction to medicines you have taken.  You have repeated (recurrent) headaches.  You feel dizzy.  You have swelling in your ankles.  You have trouble with your vision. Get help right away if:  You develop a severe headache or confusion.  You have unusual weakness or numbness, or you feel faint.  You have severe pain in your chest or abdomen.  You vomit repeatedly.  You have trouble breathing. Summary  Hypertension is when the force of blood pumping  through your arteries is too strong. If this condition is not controlled, it may put you at risk for serious complications.  Your personal target blood pressure may vary depending on your medical conditions, your age, and other factors. For most people, a normal blood pressure is less than 120/80.  Hypertension is managed by lifestyle changes, medicines, or both. Lifestyle changes include weight loss, eating a healthy, low-sodium diet, exercising more, and limiting alcohol. This information is not intended to replace advice given to you by your health care provider. Make sure you discuss any questions you have with your health care provider. Document Revised: 06/27/2018 Document Reviewed: 02/01/2016 Elsevier Patient Education  2020 [employer]  

## 2019-11-25 ENCOUNTER — Inpatient Hospital Stay (HOSPITAL_COMMUNITY)
Admission: EM | Admit: 2019-11-25 | Discharge: 2019-12-02 | DRG: 683 | Disposition: A | Discharge status: Home / Self Care | Payer: Medicaid Other | Source: Ambulatory Visit | Attending: Family Medicine | Admitting: Family Medicine

## 2019-11-25 ENCOUNTER — Telehealth: Payer: Self-pay | Admitting: Family Medicine

## 2019-11-25 ENCOUNTER — Ambulatory Visit (HOSPITAL_COMMUNITY)
Admission: EM | Admit: 2019-11-25 | Discharge: 2019-11-25 | Disposition: A | Discharge status: Nursing Home (Includes ICF) | Payer: Medicaid Other

## 2019-11-25 ENCOUNTER — Other Ambulatory Visit: Payer: Self-pay

## 2019-11-25 ENCOUNTER — Encounter (HOSPITAL_COMMUNITY): Payer: Self-pay | Admitting: Emergency Medicine

## 2019-11-25 DIAGNOSIS — G4733 Obstructive sleep apnea (adult) (pediatric): Secondary | ICD-10-CM | POA: Diagnosis present

## 2019-11-25 DIAGNOSIS — R531 Weakness: Secondary | ICD-10-CM

## 2019-11-25 DIAGNOSIS — Q612 Polycystic kidney, adult type: Secondary | ICD-10-CM | POA: Diagnosis not present

## 2019-11-25 DIAGNOSIS — I169 Hypertensive crisis, unspecified: Secondary | ICD-10-CM | POA: Diagnosis not present

## 2019-11-25 DIAGNOSIS — Z87891 Personal history of nicotine dependence: Secondary | ICD-10-CM

## 2019-11-25 DIAGNOSIS — N185 Chronic kidney disease, stage 5: Secondary | ICD-10-CM | POA: Diagnosis present

## 2019-11-25 DIAGNOSIS — E559 Vitamin D deficiency, unspecified: Secondary | ICD-10-CM | POA: Diagnosis present

## 2019-11-25 DIAGNOSIS — E875 Hyperkalemia: Secondary | ICD-10-CM | POA: Diagnosis present

## 2019-11-25 DIAGNOSIS — G4730 Sleep apnea, unspecified: Secondary | ICD-10-CM | POA: Diagnosis present

## 2019-11-25 DIAGNOSIS — E669 Obesity, unspecified: Secondary | ICD-10-CM | POA: Diagnosis present

## 2019-11-25 DIAGNOSIS — I10 Essential (primary) hypertension: Secondary | ICD-10-CM | POA: Diagnosis not present

## 2019-11-25 DIAGNOSIS — N179 Acute kidney failure, unspecified: Principal | ICD-10-CM

## 2019-11-25 DIAGNOSIS — N19 Unspecified kidney failure: Secondary | ICD-10-CM

## 2019-11-25 DIAGNOSIS — K921 Melena: Secondary | ICD-10-CM | POA: Diagnosis present

## 2019-11-25 DIAGNOSIS — Z9114 Patient's other noncompliance with medication regimen: Secondary | ICD-10-CM | POA: Diagnosis not present

## 2019-11-25 DIAGNOSIS — K5900 Constipation, unspecified: Secondary | ICD-10-CM | POA: Diagnosis present

## 2019-11-25 DIAGNOSIS — I160 Hypertensive urgency: Secondary | ICD-10-CM | POA: Diagnosis present

## 2019-11-25 DIAGNOSIS — F4323 Adjustment disorder with mixed anxiety and depressed mood: Secondary | ICD-10-CM

## 2019-11-25 DIAGNOSIS — D631 Anemia in chronic kidney disease: Secondary | ICD-10-CM | POA: Diagnosis present

## 2019-11-25 DIAGNOSIS — I248 Other forms of acute ischemic heart disease: Secondary | ICD-10-CM | POA: Diagnosis present

## 2019-11-25 DIAGNOSIS — E8729 Other acidosis: Secondary | ICD-10-CM | POA: Diagnosis present

## 2019-11-25 DIAGNOSIS — I120 Hypertensive chronic kidney disease with stage 5 chronic kidney disease or end stage renal disease: Secondary | ICD-10-CM | POA: Diagnosis present

## 2019-11-25 DIAGNOSIS — Z20822 Contact with and (suspected) exposure to covid-19: Secondary | ICD-10-CM | POA: Diagnosis present

## 2019-11-25 DIAGNOSIS — Z79899 Other long term (current) drug therapy: Secondary | ICD-10-CM | POA: Diagnosis not present

## 2019-11-25 DIAGNOSIS — R079 Chest pain, unspecified: Secondary | ICD-10-CM

## 2019-11-25 DIAGNOSIS — R778 Other specified abnormalities of plasma proteins: Secondary | ICD-10-CM | POA: Diagnosis present

## 2019-11-25 DIAGNOSIS — N189 Chronic kidney disease, unspecified: Secondary | ICD-10-CM

## 2019-11-25 DIAGNOSIS — R432 Parageusia: Secondary | ICD-10-CM | POA: Diagnosis present

## 2019-11-25 DIAGNOSIS — D649 Anemia, unspecified: Secondary | ICD-10-CM | POA: Diagnosis not present

## 2019-11-25 DIAGNOSIS — Z01810 Encounter for preprocedural cardiovascular examination: Secondary | ICD-10-CM | POA: Diagnosis not present

## 2019-11-25 DIAGNOSIS — E872 Acidosis: Secondary | ICD-10-CM

## 2019-11-25 DIAGNOSIS — M1A30X0 Chronic gout due to renal impairment, unspecified site, without tophus (tophi): Secondary | ICD-10-CM | POA: Diagnosis present

## 2019-11-25 DIAGNOSIS — R7989 Other specified abnormal findings of blood chemistry: Secondary | ICD-10-CM

## 2019-11-25 DIAGNOSIS — M1030 Gout due to renal impairment, unspecified site: Secondary | ICD-10-CM | POA: Diagnosis present

## 2019-11-25 DIAGNOSIS — Z6835 Body mass index (BMI) 35.0-35.9, adult: Secondary | ICD-10-CM

## 2019-11-25 DIAGNOSIS — R112 Nausea with vomiting, unspecified: Secondary | ICD-10-CM

## 2019-11-25 LAB — COMPREHENSIVE METABOLIC PANEL
ALT: 17 IU/L (ref 0–44)
ALT: 19 U/L (ref 0–44)
AST: 12 IU/L (ref 0–40)
AST: 19 U/L (ref 15–41)
Albumin/Globulin Ratio: 1.3 (ref 1.2–2.2)
Albumin: 4.1 g/dL (ref 4.0–5.0)
Albumin: 3.7 g/dL (ref 3.5–5.0)
Alkaline Phosphatase: 56 IU/L (ref 48–121)
Alkaline Phosphatase: 46 U/L (ref 38–126)
Anion gap: 12 (ref 5–15)
BUN/Creatinine Ratio: 10 (ref 9–20)
BUN: 77 mg/dL (ref 6–24)
BUN: 85 mg/dL — ABNORMAL HIGH (ref 6–20)
Bilirubin Total: 0.3 mg/dL (ref 0.0–1.2)
CO2: 15 mmol/L — ABNORMAL LOW (ref 20–29)
CO2: 16 mmol/L — ABNORMAL LOW (ref 22–32)
Calcium: 6.9 mg/dL — CL (ref 8.7–10.2)
Calcium: 6.8 mg/dL — ABNORMAL LOW (ref 8.9–10.3)
Chloride: 109 mmol/L — ABNORMAL HIGH (ref 96–106)
Chloride: 111 mmol/L (ref 98–111)
Creatinine, Ser: 7.96 mg/dL — ABNORMAL HIGH (ref 0.76–1.27)
Creatinine, Ser: 7.86 mg/dL — ABNORMAL HIGH (ref 0.61–1.24)
GFR calc Af Amer: 9 mL/min/{1.73_m2} — ABNORMAL LOW (ref >59)
GFR calc Af Amer: 9 mL/min — ABNORMAL LOW (ref >60)
GFR calc non Af Amer: 7 mL/min/{1.73_m2} — ABNORMAL LOW (ref >59)
GFR calc non Af Amer: 8 mL/min — ABNORMAL LOW (ref >60)
Globulin, Total: 3.2 g/dL (ref 1.5–4.5)
Glucose, Bld: 85 mg/dL (ref 70–99)
Glucose: 104 mg/dL — ABNORMAL HIGH (ref 65–99)
Potassium: 6.4 mmol/L — ABNORMAL HIGH (ref 3.5–5.2)
Potassium: 6.5 mmol/L (ref 3.5–5.1)
Sodium: 139 mmol/L (ref 134–144)
Sodium: 139 mmol/L (ref 135–145)
Total Bilirubin: 0.5 mg/dL (ref 0.3–1.2)
Total Protein: 7.3 g/dL (ref 6.0–8.5)
Total Protein: 7.5 g/dL (ref 6.5–8.1)

## 2019-11-25 LAB — CBC
HCT: 34.1 % — ABNORMAL LOW (ref 39.0–52.0)
Hemoglobin: 10.4 g/dL — ABNORMAL LOW (ref 13.0–17.0)
MCH: 26.3 pg (ref 26.0–34.0)
MCHC: 30.5 g/dL (ref 30.0–36.0)
MCV: 86.3 fL (ref 80.0–100.0)
Platelets: 272 10*3/uL (ref 150–400)
RBC: 3.95 MIL/uL — ABNORMAL LOW (ref 4.22–5.81)
RDW: 15.4 % (ref 11.5–15.5)
WBC: 9.9 10*3/uL (ref 4.0–10.5)
nRBC: 0 % (ref 0.0–0.2)

## 2019-11-25 LAB — AMMONIA: Ammonia: 48 umol/L — ABNORMAL HIGH (ref 9–35)

## 2019-11-25 LAB — URINALYSIS, ROUTINE W REFLEX MICROSCOPIC
Bacteria, UA: NONE SEEN
Bilirubin Urine: NEGATIVE
Glucose, UA: NEGATIVE mg/dL
Hgb urine dipstick: NEGATIVE
Ketones, ur: NEGATIVE mg/dL
Nitrite: NEGATIVE
Protein, ur: 100 mg/dL — AB
Specific Gravity, Urine: 1.01 (ref 1.005–1.030)
pH: 5 (ref 5.0–8.0)

## 2019-11-25 LAB — TYPE AND SCREEN
ABO/RH(D): O POS
Antibody Screen: NEGATIVE

## 2019-11-25 LAB — SARS CORONAVIRUS 2 BY RT PCR (HOSPITAL ORDER, PERFORMED IN ~~LOC~~ HOSPITAL LAB): SARS Coronavirus 2: NEGATIVE

## 2019-11-25 LAB — POTASSIUM
Potassium: 5.9 mmol/L — ABNORMAL HIGH (ref 3.5–5.1)
Potassium: 5.3 mmol/L — ABNORMAL HIGH (ref 3.5–5.1)

## 2019-11-25 LAB — CBG MONITORING, ED
Glucose-Capillary: 89 mg/dL (ref 70–99)
Glucose-Capillary: 82 mg/dL (ref 70–99)

## 2019-11-25 LAB — URIC ACID: Uric Acid: 8.2 mg/dL (ref 3.8–8.4)

## 2019-11-25 LAB — MAGNESIUM: Magnesium: 1.7 mg/dL (ref 1.7–2.4)

## 2019-11-25 LAB — PHOSPHORUS: Phosphorus: 5.9 mg/dL — ABNORMAL HIGH (ref 2.5–4.6)

## 2019-11-25 LAB — CREATININE, URINE, RANDOM: Creatinine, Urine: 92.04 mg/dL

## 2019-11-25 LAB — POC OCCULT BLOOD, ED: Fecal Occult Bld: POSITIVE — AB

## 2019-11-25 MED ORDER — ONDANSETRON HCL 4 MG PO TABS: 4.0000 mg | ORAL_TABLET | Freq: Four times a day (QID) | ORAL | Status: DC | PRN

## 2019-11-25 MED ORDER — SODIUM BICARBONATE 8.4 % IV SOLN: 100.0000 meq | Freq: Once | INTRAVENOUS | Status: DC

## 2019-11-25 MED ORDER — SODIUM ZIRCONIUM CYCLOSILICATE 10 G PO PACK: 10.0000 g | PACK | Freq: Three times a day (TID) | ORAL | Status: DC

## 2019-11-25 MED ORDER — ACETAMINOPHEN 650 MG RE SUPP: 650.0000 mg | Freq: Four times a day (QID) | RECTAL | Status: DC | PRN

## 2019-11-25 MED ORDER — SODIUM CHLORIDE 0.9 % IV SOLN
250.0000 mL | INTRAVENOUS | Status: DC | PRN
  Administered 2019-11-26: 250 mL via INTRAVENOUS

## 2019-11-25 MED ORDER — LABETALOL HCL 5 MG/ML IV SOLN: 10.0000 mg | INTRAVENOUS | Status: DC | PRN

## 2019-11-25 MED ORDER — OXYCODONE HCL 5 MG PO TABS
5.0000 mg | ORAL_TABLET | Freq: Four times a day (QID) | ORAL | Status: DC | PRN
  Administered 2019-11-25 – 2019-11-29 (×7): 5 mg via ORAL
  Filled 2019-11-25 (×8): qty 1

## 2019-11-25 MED ORDER — SODIUM CHLORIDE 0.9% FLUSH
3.0000 mL | INTRAVENOUS | Status: DC | PRN
  Administered 2019-11-27: 3 mL via INTRAVENOUS

## 2019-11-25 MED ORDER — SODIUM CHLORIDE 0.9% FLUSH
3.0000 mL | Freq: Two times a day (BID) | INTRAVENOUS | Status: DC
  Administered 2019-11-26 – 2019-11-29 (×7): 3 mL via INTRAVENOUS

## 2019-11-25 MED ORDER — CLONIDINE HCL 0.2 MG PO TABS: 0.2000 mg | ORAL_TABLET | Freq: Three times a day (TID) | ORAL | Status: DC | PRN

## 2019-11-25 MED ORDER — AMLODIPINE BESYLATE 10 MG PO TABS
10.0000 mg | ORAL_TABLET | Freq: Every day | ORAL | Status: DC
  Administered 2019-11-25 – 2019-12-02 (×8): 10 mg via ORAL
  Filled 2019-11-25 (×5): qty 1
  Filled 2019-11-25: qty 2
  Filled 2019-11-25 (×2): qty 1

## 2019-11-25 MED ORDER — CLONIDINE HCL 0.2 MG PO TABS: 0.2000 mg | ORAL_TABLET | Freq: Two times a day (BID) | ORAL | Status: DC

## 2019-11-25 MED ORDER — ACETAMINOPHEN 325 MG PO TABS
650.0000 mg | ORAL_TABLET | Freq: Four times a day (QID) | ORAL | Status: DC | PRN
  Administered 2019-11-26 – 2019-12-02 (×7): 650 mg via ORAL
  Filled 2019-11-25 (×8): qty 2

## 2019-11-25 MED ORDER — CALCITRIOL 0.25 MCG PO CAPS
0.2500 ug | ORAL_CAPSULE | Freq: Every day | ORAL | Status: DC
  Administered 2019-11-26 – 2019-11-30 (×5): 0.25 ug via ORAL
  Filled 2019-11-25 (×6): qty 1

## 2019-11-25 MED ORDER — SODIUM CHLORIDE 0.9% FLUSH
3.0000 mL | Freq: Two times a day (BID) | INTRAVENOUS | Status: DC
  Administered 2019-11-25 – 2019-12-02 (×12): 3 mL via INTRAVENOUS

## 2019-11-25 MED ORDER — HYDROMORPHONE HCL 1 MG/ML IJ SOLN
0.5000 mg | INTRAMUSCULAR | Status: AC | PRN
  Administered 2019-11-26 (×2): 0.5 mg via INTRAVENOUS
  Filled 2019-11-25: qty 1
  Filled 2019-11-25: qty 0.5

## 2019-11-25 MED ORDER — SODIUM CHLORIDE 0.9% IV SOLUTION: Freq: Once | INTRAVENOUS | Status: DC

## 2019-11-25 MED ORDER — CALCIUM CARBONATE ANTACID 500 MG PO CHEW
1.0000 | CHEWABLE_TABLET | Freq: Three times a day (TID) | ORAL | Status: DC
  Administered 2019-11-25 – 2019-12-02 (×20): 200 mg via ORAL
  Filled 2019-11-25 (×22): qty 1

## 2019-11-25 MED ORDER — HYDRALAZINE HCL 50 MG PO TABS
50.0000 mg | ORAL_TABLET | Freq: Three times a day (TID) | ORAL | Status: DC
  Administered 2019-11-25 – 2019-11-26 (×2): 50 mg via ORAL
  Filled 2019-11-25 (×2): qty 2

## 2019-11-25 MED ORDER — SODIUM CHLORIDE 0.9 % IV SOLN: INTRAVENOUS | Status: DC

## 2019-11-25 MED ORDER — SODIUM ZIRCONIUM CYCLOSILICATE 10 G PO PACK
10.0000 g | PACK | Freq: Once | ORAL | Status: AC
  Administered 2019-11-25: 10 g via ORAL
  Filled 2019-11-25: qty 1

## 2019-11-25 MED ORDER — ONDANSETRON HCL 4 MG/2ML IJ SOLN
4.0000 mg | Freq: Four times a day (QID) | INTRAMUSCULAR | Status: DC | PRN
  Administered 2019-11-26 – 2019-11-29 (×5): 4 mg via INTRAVENOUS
  Filled 2019-11-25 (×6): qty 2

## 2019-11-25 MED ORDER — PANTOPRAZOLE SODIUM 40 MG IV SOLR
40.0000 mg | Freq: Two times a day (BID) | INTRAVENOUS | Status: DC
  Filled 2019-11-25: qty 40

## 2019-11-25 MED ORDER — SODIUM BICARBONATE 650 MG PO TABS
650.0000 mg | ORAL_TABLET | Freq: Three times a day (TID) | ORAL | Status: DC
  Administered 2019-11-26 – 2019-12-02 (×18): 650 mg via ORAL
  Filled 2019-11-25 (×22): qty 1

## 2019-11-25 NOTE — ED Notes (Signed)
CBG 89; EMS her for transport

## 2019-11-25 NOTE — ED Notes (Signed)
Pt upset that he came by ambulance and was sent in the waiting room he is htewatening to go to [facility]  His pot is elevated and the sample is sl hemolized

## 2019-11-25 NOTE — ED Provider Notes (Signed)
[facility] EMERGENCY DEPARTMENT [facility] Note   CSN: [id number] Arrival date & time: 11/25/19  1814     History Chief Complaint  Patient presents with  . Weakness  . Headache    Michael [name] is a 44 y.o. male.  HPI    Patient with significant medical history of CKD, hypertension, polycystic kidney disease, sleep apnea presents to with chief complaint of general weakness, nausea, vomiting, and a headache.  Patient explains over the last few weeks he has been feeling worse.  He states the nausea has become more frequent, vomiting once a day and feels he is losing his appetite.  He also admits that he has frequent headaches that come and go, denies paresthesias, change in vision, slurring of his speech, loss of balance.  He endorses occasional bloody stools.  He states he sees bright red blood on his stool as well on the toilet paper.  He says this happens after he has a large bowel movement where he is straining.  Patient denies seeing blood in his vomit, dysuria, hematuria, urinary frequency, urinary urgency.  Patient denies any alleviating or aggravating factors.  Patient denies fever, chills, chest pain, shortness of breath, abdominal pain, dysuria, pedal edema.  Past Medical History:  Diagnosis Date  . CKD (chronic kidney disease)   . Hypertension   . Polycystic kidney disease   . Sleep apnea   . Vitamin D deficiency 11/2018    Patient Active Problem List   Diagnosis Date Noted  . Renal failure 11/25/2019  . Hyperkalemia 11/25/2019  . Normocytic anemia 11/25/2019  . Chronic gout due to renal impairment without tophus 12/14/2018  . Observed sleep apnea 09/01/2016  . Excessive daytime sleepiness 09/01/2016  . Primary snoring 09/01/2016  . Acute pyelonephritis 05/25/2016  . Acute gouty arthritis 05/25/2016  . Hematuria 05/25/2016  . Essential hypertension, benign 05/25/2016  . Morbid obesity ([place]) 05/25/2016  . Hypertensive crisis 05/23/2016  . CKD  (chronic kidney disease) stage 3, GFR 30-59 ml/min 05/23/2016  . Adult polycystic kidney disease 05/23/2016  . SIRS (systemic inflammatory response syndrome) ([place]) 05/23/2016    History reviewed. No pertinent surgical history.     Family History  Problem Relation Age of Onset  . Bipolar disorder Father     Social History   Tobacco Use  . Smoking status: Former Research scientist (life sciences)  . Smokeless tobacco: Never Used  Vaping Use  . Vaping Use: Never used  Substance Use Topics  . Alcohol use: Yes    Comment: wine occ   . Drug use: No    Home Medications Prior to Admission medications   Medication Sig Start Date End Date Taking? Authorizing Somya Jauregui  allopurinol (ZYLOPRIM) 100 MG tablet Take 1 tablet (100 mg total) by mouth 2 (two) times daily. 11/24/19  Yes [name], FNP  APPLE CIDER VINEGAR PO Take 1 tablet by mouth daily. [place] Gummie   Yes Jilleen Essner, Historical, MD  atenolol (TENORMIN) 25 MG tablet Take 1 tablet (25 mg total) by mouth 2 (two) times daily. 11/24/19  Yes [name], FNP  hydrALAZINE (APRESOLINE) 50 MG tablet Take 1 tablet (50 mg total) by mouth every 8 (eight) hours. Patient taking differently: Take 50 mg by mouth in the morning and at bedtime.  02/24/19  Yes [name], FNP  hydrocortisone 1 % ointment Apply 1 application topically 2 (two) times daily. Patient taking differently: Apply 1 application topically 2 (two) times daily as needed for itching.  10/19/19  Yes  Vevelyn Francois, NP  indomethacin (INDOCIN) 25 MG capsule Take 1 capsule (25 mg total) by mouth 2 (two) times daily with a meal. Patient taking differently: Take 25 mg by mouth 2 (two) times daily as needed (gout).  11/24/19  Yes [name], FNP  losartan (COZAAR) 50 MG tablet Take 1 tablet (50 mg total) by mouth daily. 11/24/19  Yes [name], FNP  multivitamin (ONE-A-DAY MEN'S) TABS tablet Take 1 tablet by mouth daily. 03/21/18  Yes Lanae Boast, [place]    Allergies    Patient has no  known allergies.  Review of Systems   Review of Systems  Constitutional: Positive for appetite change. Negative for chills and fever.  HENT: Negative for congestion, dental problem, sore throat, tinnitus and trouble swallowing.   Eyes: Negative for visual disturbance.  Respiratory: Negative for cough and shortness of breath.   Cardiovascular: Negative for chest pain and palpitations.  Gastrointestinal: Positive for nausea and vomiting. Negative for abdominal pain and diarrhea.  Genitourinary: Positive for flank pain. Negative for enuresis and frequency.  Musculoskeletal: Negative for back pain.  Skin: Negative for rash.  Neurological: Positive for weakness. Negative for dizziness.  Hematological: Does not bruise/bleed easily.    Physical Exam Updated Vital Signs BP (!) 174/95   Pulse 81   Temp 99.1 F (37.3 C) (Oral)   Resp (!) 23   Ht 6\' 1"  (1.854 m)   Wt 122.5 kg   SpO2 98%   BMI 35.62 kg/m   Physical Exam Vitals and nursing note reviewed.  Constitutional:      General: He is not in acute distress.    Appearance: He is not ill-appearing.  HENT:     Head: Normocephalic and atraumatic.     Nose: No congestion.     Mouth/Throat:     Mouth: Mucous membranes are moist.     Pharynx: Oropharynx is clear.  Eyes:     General: No scleral icterus. Cardiovascular:     Rate and Rhythm: Normal rate and regular rhythm.     Pulses: Normal pulses.     Heart sounds: No murmur heard.  No friction rub. No gallop.   Pulmonary:     Effort: No respiratory distress.     Breath sounds: No wheezing, rhonchi or rales.  Abdominal:     General: There is distension.     Palpations: Abdomen is soft.     Tenderness: There is no abdominal tenderness. There is no right CVA tenderness, left CVA tenderness or guarding.     Comments: Patient's abdomen was visualized, distended, normoactive bowel sounds, dull to percussion, slight tender to palpation along his left and right flanks, no acute  abdomen noted.  Musculoskeletal:        General: No swelling or tenderness.     Right lower leg: No edema.     Left lower leg: No edema.  Skin:    General: Skin is warm and dry.     Capillary Refill: Capillary refill takes less than 2 seconds.     Findings: No rash.  Neurological:     General: No focal deficit present.     Mental Status: He is alert and oriented to person, place, and time.  Psychiatric:        Mood and Affect: Mood normal.     ED Results / Procedures / Treatments   Labs (all labs ordered are listed, but only abnormal results are displayed) Labs Reviewed  CBC - Abnormal; Notable for  the following components:      Result Value   RBC 3.95 (*)    Hemoglobin 10.4 (*)    HCT 34.1 (*)    All other components within normal limits  COMPREHENSIVE METABOLIC PANEL - Abnormal; Notable for the following components:   Potassium 6.5 (*)    CO2 16 (*)    BUN 85 (*)    Creatinine, Ser 7.86 (*)    Calcium 6.8 (*)    GFR calc non Af Amer 8 (*)    GFR calc Af Amer 9 (*)    All other components within normal limits  POTASSIUM - Abnormal; Notable for the following components:   Potassium 5.9 (*)    All other components within normal limits  POC OCCULT BLOOD, ED - Abnormal; Notable for the following components:   Fecal Occult Bld POSITIVE (*)    All other components within normal limits  SARS CORONAVIRUS 2 BY RT PCR (HOSPITAL ORDER, [facility])  MAGNESIUM  URINALYSIS, ROUTINE W REFLEX MICROSCOPIC  AMMONIA  CREATININE, URINE, RANDOM  HIV ANTIBODY (ROUTINE TESTING W REFLEX)  BASIC METABOLIC PANEL  CBC  POTASSIUM  POTASSIUM  POTASSIUM  POTASSIUM  VITAMIN B12  FOLATE  IRON AND TIBC  FERRITIN  RETICULOCYTES  PHOSPHORUS  PARATHYROID HORMONE, INTACT (NO CA)  CBG MONITORING, ED  TYPE AND SCREEN    EKG None  Radiology No results found.  Procedures Procedures (including critical care time)  Medications Ordered in ED Medications   hydrALAZINE (APRESOLINE) tablet 50 mg (has no administration in time range)  sodium chloride flush (NS) 0.9 % injection 3 mL (has no administration in time range)  sodium chloride flush (NS) 0.9 % injection 3 mL (has no administration in time range)  sodium chloride flush (NS) 0.9 % injection 3 mL (has no administration in time range)  0.9 %  sodium chloride infusion (has no administration in time range)  acetaminophen (TYLENOL) tablet 650 mg (has no administration in time range)    Or  acetaminophen (TYLENOL) suppository 650 mg (has no administration in time range)  ondansetron (ZOFRAN) tablet 4 mg (has no administration in time range)    Or  ondansetron (ZOFRAN) injection 4 mg (has no administration in time range)  0.9 %  sodium chloride infusion (Manually program via Guardrails IV Fluids) (has no administration in time range)  calcitRIOL (ROCALTROL) capsule 0.25 mcg (has no administration in time range)  calcium carbonate (TUMS - dosed in mg elemental calcium) chewable tablet 200 mg of elemental calcium (has no administration in time range)  sodium bicarbonate tablet 650 mg (has no administration in time range)  amLODipine (NORVASC) tablet 10 mg (has no administration in time range)  pantoprazole (PROTONIX) injection 40 mg (has no administration in time range)  sodium zirconium cyclosilicate (LOKELMA) packet 10 g (has no administration in time range)  0.9 %  sodium chloride infusion (has no administration in time range)  cloNIDine (CATAPRES) tablet 0.2 mg (has no administration in time range)  sodium zirconium cyclosilicate (LOKELMA) packet 10 g (10 g Oral Given 11/25/19 2147)    ED Course  I have reviewed the triage vital signs and the nursing notes.  Pertinent labs & imaging results that were available during my care of the patient were reviewed by me and considered in my medical decision making (see chart for details).    MDM Rules/Calculators/A&P  I have  personally reviewed all imaging, labs and have interpreted them.  Patient presents with general weakness, nausea and vomiting.  Patient was alert and oriented, did not appear in acute distress, patient was hypertensive on evaluation remaining vital signs within normal limits.  No focal deficits noted on exam, lung sounds are clear bilaterally, abdomen was distended with slight tenderness along his right and left flank, no CVA tenderness, no acute abdomen noted.  No pedal edema.  Will order screening labs and reassess.  CMP shows hyperkalemia of 6.5, BUN 85, creatinine 7.8.  CBC shows normocytic anemia without  leukocytosis.  Hemoccult positive.  Pending ammonia, magnesium, Covid.  EKG shows sinus rhythm without signs of ischemia or ST elevation.  Due to increased creatinine of 7.86 from his baseline of 5 and his BUN of 85 increased from 55 will consult nephrology for further evaluation and management.  Spoke with Dr.Kruska   Of the nephrologist team and she agrees that patient does not need emergent dialysis but would benefit the hyperkalemia order set.  She also feels patient should be admitted for further evaluation and management.  She will come and evaluate patient in the morning.  Spoke with Dr.Opyd of the hospitalist team who has agreed to admit the patient for further evaluation.  I have low suspicion patient will require emergent hemodialysis as he is not in respiratory distress, no marked EKG changes, he is not encephalopathic.  Low suspicion for systemic infection as patient was nontoxic-appearing, vital signs reassuring, no obvious source infection on exam, no leukocytosis seen on CBC.  Low suspicion for cardiac abnormality as patient denies chest pain, shortness of breath, EKG does not show ischemia or ST elevation/ depression.  Low suspicion for intra-abdominal  abnormality requiring surgical intervention as patient denies abdominal pain, tolerating p.o. without difficulty, no acute abdomen  noted on exam.  I suspect patient's generalized weakness and nausea are associated with his end-stage renal disease and electrolyte derailment.    Patient appears resting comfortably, showing no acute signs stress, vital signs have remained stable.  Patient care will be handed over to hospitalist team for further evaluation management. Final Clinical Impression(s) / ED Diagnoses Final diagnoses:  AKI (acute kidney injury) ([place])  Generalized weakness  Hyperkalemia    Rx / DC Orders ED Discharge Orders    None       Aron Baba 11/25/19 2225    Davonna Belling, MD 11/26/19 2333

## 2019-11-25 NOTE — ED Notes (Signed)
Called EMS, for emergent transport per Dr. Mannie Stabile 2/2 HTN crisis, HA, bilateral numbness feet.

## 2019-11-25 NOTE — ED Notes (Signed)
Patient is being discharged from the Urgent Care and sent to the Emergency Department via EMS . Per Dr. Mannie Stabile patient is in need of higher level of care due to HTN crisis, kidney failure.  Patient is aware and verbalizes understanding of plan of care. There were no vitals filed for this visit.

## 2019-11-25 NOTE — Consult Note (Signed)
[facility]  INPATIENT CONSULTATION  Reason for Consultation: hyperkalemia, CKD Requesting Provider: Dr. Alvino Chapel  HPI: Michael [name] is an 44 y.o. male with HTN, CKD secondary to ADPKD, gout who is seen for evaluation and management of CKD and hyperkalemia.   Seen in IM clinic yesterday - HTN, rec'd clonidine in clinic for HTN; losartan and atenolol Rxd - he isn't sure what he's been taking at home lately.  Gout treated with indocin rx and allopurinol.  Labs reviewed showing GFR 9 and NP alerted nephrology and Pt.  He presented to ED today c/o dark stools x 3 days, recent emesis.  Found to have K 6.5, bicarb 16, BUN 85, Cr 7.86.  He was treated with lokelma 10g.   On my interview he's had some dysgeusia, nausea, occasional emesis which he relates to a tick bite 3-61mo ago.  Feels he's lost wt (can see in face and others comment) but scale same.  No dyspnea, no edema, no LUTs.  Occ HA when BP >200 mainly.  Says no mental confusion.  Says sleep ok.  No pruritis.  No tetany but tingling fingers and toes occasionally.  He follows with Dr. Hollie Salk at [facility] since 2019, last visit 04/2019.  Was pursuing a more naturopathic lifestyle at that time with juicing.  At that time his Cr was 4.2, K 6.  06/22/19 Labs with PCP showed K 6.4, BUN 58, Cr 5.6.  He missed several f/u with Dr. Hollie Salk since 04/2019 and had visit scheduled for next week.   Hasn't been able to afford lokelma or veltassa.  PMH: Past Medical History:  Diagnosis Date  . CKD (chronic kidney disease)   . Hypertension   . Polycystic kidney disease   . Sleep apnea   . Vitamin D deficiency 11/2018   PSH: History reviewed. No pertinent surgical history.   Past Medical History:  Diagnosis Date  . CKD (chronic kidney disease)   . Hypertension   . Polycystic kidney disease   . Sleep apnea   . Vitamin D deficiency 11/2018    Medications:  I have reviewed the patient's current medications.  He's not really clear what he's been  taking  (Not in a hospital admission)   ALLERGIES:  No Known Allergies  FAM HX: Family History  Problem Relation Age of Onset  . Bipolar disorder Father     Social History:   reports that he has quit smoking. He has never used smokeless tobacco. He reports current alcohol use. He reports that he does not use drugs.  ROS: 12 system ROS neg except per HPI  Blood pressure (!) 191/105, pulse 78, temperature 99.1 F (37.3 C), temperature source Oral, resp. rate 19, height 6\' 1"  (1.854 m), weight 122.5 kg, SpO2 100 %. PHYSICAL EXAM: Gen: eating [place] sandwich, appears comfortable  Eyes:  anicteric ENT: MMM Neck: supple, no JVD CV:  RRR, no rub Abd:  Soft, obese, nontender Lungs: clear to bases GU: no foley Extr:  No edema Neuro: fully conversant, engaged Skin: warm and dry   Results for orders placed or performed during the hospital encounter of 11/25/19 (from the past 48 hour(s))  CBC     Status: Abnormal   Collection Time: 11/25/19  6:48 PM  Result Value Ref Range   WBC 9.9 4.0 - 10.5 K/uL   RBC 3.95 (L) 4.22 - 5.81 MIL/uL   Hemoglobin 10.4 (L) 13.0 - 17.0 g/dL   HCT 34.1 (L) 39 - 52 %   MCV 86.3  80.0 - 100.0 fL   MCH 26.3 26.0 - 34.0 pg   MCHC 30.5 30.0 - 36.0 g/dL   RDW 15.4 11.5 - 15.5 %   Platelets 272 150 - 400 K/uL   nRBC 0.0 0.0 - 0.2 %    Comment: Performed at [facility], [place] [address] [place], [place] [address]  Comprehensive metabolic panel     Status: Abnormal   Collection Time: 11/25/19  6:48 PM  Result Value Ref Range   Sodium 139 135 - 145 mmol/L   Potassium 6.5 (HH) 3.5 - 5.1 mmol/L    Comment: SLIGHT HEMOLYSIS CRITICAL RESULT CALLED TO, READ BACK BY AND VERIFIED WITHLoletha Grayer CHRISCO RN 1954 [id number] K FORSYTH    Chloride 111 98 - 111 mmol/L   CO2 16 (L) 22 - 32 mmol/L   Glucose, Bld 85 70 - 99 mg/dL    Comment: Glucose reference range applies only to samples taken after fasting for at least 8 hours.   BUN 85 (H) 6 - 20 mg/dL   Creatinine,  Ser 7.86 (H) 0.61 - 1.24 mg/dL   Calcium 6.8 (L) 8.9 - 10.3 mg/dL   Total Protein 7.5 6.5 - 8.1 g/dL   Albumin 3.7 3.5 - 5.0 g/dL   AST 19 15 - 41 U/L   ALT 19 0 - 44 U/L   Alkaline Phosphatase 46 38 - 126 U/L   Total Bilirubin 0.5 0.3 - 1.2 mg/dL   GFR calc non Af Amer 8 (L) >60 mL/min   GFR calc Af Amer 9 (L) >60 mL/min   Anion gap 12 5 - 15    Comment: Performed at [facility] [address] [place], [place] [address]    No results found.  Assessment/Plan **Hyperkalemia:  Medically managing - lokelma 10g given - will cont TID for now.  Low K diet discussed; seems outpt diet quite high K.  No RAAS inhibition.  NS 184mL/hr overnight to help promote K excretion; if retains volume can use loop diuretic too.   **HTN:  No RAAS inhibition with K/Cr. Resume hydralazine, amlodipine, can use PRN clonidine.    **CKD5 vs ESRD secondary to PKD:  Certainly seems to have some uremic symptoms but if K can be controlled perhaps he can be temporized until has mature access.  Will need to follow over next 24-48h to see.  His spotty follow up in clinic may be a barrier to this plan.  He's aware of this decision point in the next few days.  **Anemia:  Hb 10.4, check iron indices.  No indications for ESA.  GI eval per primary.  **NAGMA:  Secondary to CKD, start PO bicarb  **Hypocalcemia:  Check phos, PTH.  Start calcitriol and po Calcium.  **Gout:  Uric acid 8.2.  On allopurinol.   [name] 11/25/2019, 9:31 PM

## 2019-11-25 NOTE — ED Notes (Signed)
The pt lay down in the floor cursing  Everybody in sight Michael [name] came in to redraw his potassium and he cursed him so much  That Michael [name] would not redraw his potassium  Still threatening to leave

## 2019-11-25 NOTE — ED Notes (Signed)
Pt reports numbness to bilateral feet, reporting dark stools starting 3 days. Pt reports taking Tylenol for pain at home, sees sickle cell clinic for kidneys and [facility], Hx of PCKD. Pt reports x1 emesis episode this morning, no active vomiting, intermittent nausea, diarrhea and lightheadedness.

## 2019-11-25 NOTE — H&P (Addendum)
History and Physical    Michael [name] DOB: 1975/11/09 DOA: 11/25/2019  PCP: [name], FNP   Patient coming from: Home   Chief Complaint: Sleepy, blood in stools, malaise    HPI: Michael [name] is a 44 y.o. male with medical history significant for hypertension, polycystic kidney disease with advanced CKD, and gout, now presenting to the emergency department for evaluation of hypertension and worsening renal failure.  Patient was following with nephrology in the outpatient setting but his management has been complicated by serial no-shows.  Over recent weeks, he has developed progressive loss of appetite, change in his taste, occasional nausea, and increased fatigue.  He develops headaches at times which she attributes to blood pressure being high.  He has occasional tingling in his bilateral feet and sometimes fingertips but no focal weakness or change in vision.  He denies chest pain or leg swelling.  He has noticed some maroon blood mixed with stool occasionally in recent months and reports that this has been more frequent in the past 2 weeks over which interval he sometimes feels the need to defecate but only passes some maroon blood and clots.  He was seen by his PCP yesterday, blood work was obtained, and he was noted to have worsening in his GFR with increased potassium and decreased bicarbonate.  ED Course: Upon arrival to the ED, patient is found to be afebrile, saturating well on room air, and with blood pressure as high as 213/115.  EKG features sinus rhythm with LVH and repolarization abnormality.  Chemistry panel is concerning for potassium 6.5, bicarbonate 16, BUN 85, and creatinine 7.86, up from 5.56 in April.  CBC is notable for a normocytic anemia with hemoglobin 10.4, down from 11.9 in April.  PCR screening test has not yet resulted.  Nephrology was consulted by the ED physician, patient was treated with [facility], and hospitalists asked to admit.  Review of Systems:    All other systems reviewed and apart from HPI, are negative.  Past Medical History:  Diagnosis Date  . CKD (chronic kidney disease)   . Hypertension   . Polycystic kidney disease   . Sleep apnea   . Vitamin D deficiency 11/2018    History reviewed. No pertinent surgical history.  Social History:   reports that he has quit smoking. He has never used smokeless tobacco. He reports current alcohol use. He reports that he does not use drugs.  No Known Allergies  Family History  Problem Relation Age of Onset  . Bipolar disorder Father      Prior to Admission medications   Medication Sig Start Date End Date Taking? Authorizing Provider  allopurinol (ZYLOPRIM) 100 MG tablet Take 1 tablet (100 mg total) by mouth 2 (two) times daily. 11/24/19   [name], FNP  APPLE CIDER VINEGAR PO Take 1 tablet by mouth daily. [place] [facility]    [provider]  atenolol (TENORMIN) 25 MG tablet Take 1 tablet (25 mg total) by mouth 2 (two) times daily. 11/24/19   [name], FNP  hydrALAZINE (APRESOLINE) 50 MG tablet Take 1 tablet (50 mg total) by mouth every 8 (eight) hours. Patient taking differently: Take 50 mg by mouth in the morning and at bedtime.  02/24/19   [name], FNP  hydrocortisone 1 % ointment Apply 1 application topically 2 (two) times daily. 10/19/19   Vevelyn Francois, NP  indomethacin (INDOCIN) 25 MG capsule Take 1 capsule (25 mg total) by mouth 2 (two)  times daily with a meal. 11/24/19   [name], FNP  losartan (COZAAR) 50 MG tablet Take 1 tablet (50 mg total) by mouth daily. 11/24/19   [name], FNP  multivitamin (ONE-A-DAY MEN'S) TABS tablet Take 1 tablet by mouth daily. 03/21/18   Lanae Boast, FNP    Physical Exam: Vitals:   11/25/19 1814 11/25/19 2007 11/25/19 2045 11/25/19 2145  BP: (!) 174/110 (!) 187/115 (!) 191/105 (!) 174/95  Pulse: 68 74 78 81  Resp: 18 20 19  (!) 23  Temp: 99.1 F (37.3 C)     TempSrc: Oral     SpO2: 100% 100%  100% 98%  Weight: 122.5 kg     Height: 6\' 1"  (1.854 m)       Constitutional: NAD, calm  Eyes: PERTLA, lids and conjunctivae normal ENMT: Mucous membranes are moist. Posterior pharynx clear of any exudate or lesions.   Neck: normal, supple, no masses, no thyromegaly Respiratory:  no wheezing, no crackles. No accessory muscle use.  Cardiovascular: S1 & S2 heard, regular rate and rhythm. No extremity edema.   Abdomen: soft, no tenderness. Bowel sounds active.  Musculoskeletal: no clubbing / cyanosis. No joint deformity upper and lower extremities.   Skin: no significant rashes, lesions, ulcers. Warm, dry, well-perfused. Neurologic: CN 2-12 grossly intact. Sensation intact. Strength 5/5 in all 4 limbs.  Psychiatric: Alert and oriented to person, place, and situation. Pleasant and cooperative.    Labs and Imaging on Admission: I have personally reviewed following labs and imaging studies  CBC: Recent Labs  Lab 11/25/19 1848  WBC 9.9  HGB 10.4*  HCT 34.1*  MCV 86.3  PLT 628   Basic Metabolic Panel: Recent Labs  Lab 11/24/19 1027 11/25/19 1848  NA 139 139  K 6.4* 6.5*  CL 109* 111  CO2 15* 16*  GLUCOSE 104* 85  BUN 77* 85*  CREATININE 7.96* 7.86*  CALCIUM 6.9* 6.8*   GFR: Estimated Creatinine Clearance: 16.4 mL/min (A) (by C-G formula based on SCr of 7.86 mg/dL (H)). Liver Function Tests: Recent Labs  Lab 11/24/19 1027 11/25/19 1848  AST 12 19  ALT 17 19  ALKPHOS 56 46  BILITOT 0.3 0.5  PROT 7.3 7.5  ALBUMIN 4.1 3.7   No results for input(s): LIPASE, AMYLASE in the last 168 hours. No results for input(s): AMMONIA in the last 168 hours. Coagulation Profile: No results for input(s): INR, PROTIME in the last 168 hours. Cardiac Enzymes: No results for input(s): CKTOTAL, CKMB, CKMBINDEX, TROPONINI in the last 168 hours. BNP (last 3 results) No results for input(s): PROBNP in the last 8760 hours. HbA1C: Recent Labs    11/24/19 1122  HGBA1C 5.6*  5.6  5.6   5.6   CBG: Recent Labs  Lab 11/25/19 1746 11/25/19 2132  GLUCAP 89 82   Lipid Profile: No results for input(s): CHOL, HDL, LDLCALC, TRIG, CHOLHDL, LDLDIRECT in the last 72 hours. Thyroid Function Tests: No results for input(s): TSH, T4TOTAL, FREET4, T3FREE, THYROIDAB in the last 72 hours. Anemia Panel: No results for input(s): VITAMINB12, FOLATE, FERRITIN, TIBC, IRON, RETICCTPCT in the last 72 hours. Urine analysis:    Component Value Date/Time   COLORURINE YELLOW 05/15/2019 [place] 05/15/2019 1343   LABSPEC 1.010 05/15/2019 1343   PHURINE 6.0 05/15/2019 1343   GLUCOSEU NEGATIVE 05/15/2019 1343   HGBUR NEGATIVE 05/15/2019 1343   BILIRUBINUR neg 05/26/2019 1135   [place] 05/15/2019 1343   PROTEINUR Positive (A) 05/26/2019 1135  PROTEINUR 100 (A) 05/15/2019 1343   UROBILINOGEN 0.2 05/26/2019 1135   UROBILINOGEN 0.2 06/05/2017 1434   NITRITE neg 05/26/2019 1135   NITRITE NEGATIVE 05/15/2019 1343   LEUKOCYTESUR Negative 05/26/2019 1135   LEUKOCYTESUR TRACE (A) 05/15/2019 1343   Sepsis Labs: @LABRCNTIP (procalcitonin:4,lacticidven:4) )No results found for this or any previous visit (from the past 240 hour(s)).   Radiological Exams on Admission: No results found.  EKG: Independently reviewed. Sinus rhythm, LVH, repolarization abnormality.   Assessment/Plan   1. Acute kidney injury superimposed on CKD IV-V; hyperkalemia; acidosis; uremia   - Presents with worsening renal function and is found to have potassium 6.5, BUN 85, and bicarb 16  - Nephrology consulting and much appreciated - Continue cardiac monitoring, continue Lokelma, bicarb, repeat chem panel, renally-dose medications, low-potassium diet, IVF, diuresis if needed    2. Hypertensive urgency  - BP as high as 213/115 in setting of worsening renal function and not taking medications for the past week  - He has had occasional HA and b/l feet tingling but not complaining of that now and  denies any focal weakness or vision change, denies chest pain  - Resume hydralazine and Norvasc, use clonidine as needed    3. Anemia; rectal bleeding  - Patient reports hx of occasional maroon blood mixed with stool, more frequent over the past couple weeks and sometimes only passing maroon blood and clots after an urge to defecate  - There is no hematemesis or upper abdominal pain and increased BUN likely reflects worsening renal function rather than UGIB  - Hgb is 10.4 on admission, down from 11.9 in April  - Monitor CBC, likely stable for outpatient GI evaluation     DVT prophylaxis: SCDs Code Status: Full  Family Communication: Discussed with patient  Disposition Plan:  Patient is from: Home  Anticipated d/c is to: TBD Anticipated d/c date is: 11/28/19 Patient currently: In renal failure Consults called: Nephrology consulted by ED physician  Admission status: inpatient     Vianne Bulls, MD [facility]  11/25/2019, 10:07 PM

## 2019-11-25 NOTE — ED Notes (Signed)
This patient has multiple complaints of wait time and having to wait after coming here in an ambulance. States he wants to leave and go to [facility]. Triage RN Gerald Stabs notified and patient brought into triage room to reassess. Vitals updated

## 2019-11-25 NOTE — Telephone Encounter (Signed)
pls return his call

## 2019-11-25 NOTE — Telephone Encounter (Addendum)
Michael [name] is a 44 year old male with a medical history significant for uncontrolled hypertension, gout, and chronic kidney disease stage IV that presented on 11/24/2019 for follow-up of chronic conditions. Reviewed patient's labs. Creatinine is markedly elevated at 7.96 and GFR is 9. Immediately contacted patient's nephrologist at [facility]. Michael [name] has missed his last 3 scheduled appointments. Made a first available appointment, contacted patient. Discussed the importance of taking medications consistently and follow-up appointments in order to achieve positive outcomes. Patient and I discussed end-stage renal disease at length. All medications have been sent to patient's pharmacy. He was advised to avoid all nephrotoxins. Discontinued indomethacin, contraindicated with ESRD.  Patient will return to clinic on 11/27/2019 to repeat labs.    The patient was given clear instructions to go to ER or return to medical center if symptoms do not improve, worsen or new problems develop. The patient verbalized understanding.      [name]  APRN, MSN, FNP-C [facility] [address] [place], [place] [id number] [phone]

## 2019-11-25 NOTE — ED Triage Notes (Signed)
Pt c/o HA, feeling weak for approx 2 days. Brought to triage STAT, BP elevated also c/o bilateral flank pain, bilateral foot tingling. Denies CP or one-sided extremity weakness, fever, chills, or any URI sx. Smile symmetrical, grips equal/strong, leg strength moderate/equal.

## 2019-11-25 NOTE — ED Triage Notes (Signed)
Pt presents with hypertension and decreased kidney function.

## 2019-11-25 NOTE — ED Triage Notes (Addendum)
Patient arrives to ED via EMS to triage waiting area from Urgent Care across the street, with complaints of hypertension, headache/weakness x2 days, and worsening kidney failure. Pt complaints of bilateral flank pain, fevers, and numbness in hand and feet for the past week. Lastly, pt states dark red blood in stool over a year that has worsened over the last week. Pt NIH 0 in triage but BP elevated.

## 2019-11-26 ENCOUNTER — Telehealth: Payer: Self-pay

## 2019-11-26 ENCOUNTER — Encounter (HOSPITAL_COMMUNITY): Payer: Self-pay | Admitting: Family Medicine

## 2019-11-26 LAB — CBC
HCT: 31.3 % — ABNORMAL LOW (ref 39.0–52.0)
Hemoglobin: 9.6 g/dL — ABNORMAL LOW (ref 13.0–17.0)
MCH: 27.2 pg (ref 26.0–34.0)
MCHC: 30.7 g/dL (ref 30.0–36.0)
MCV: 88.7 fL (ref 80.0–100.0)
Platelets: 248 10*3/uL (ref 150–400)
RBC: 3.53 MIL/uL — ABNORMAL LOW (ref 4.22–5.81)
RDW: 15.7 % — ABNORMAL HIGH (ref 11.5–15.5)
WBC: 10 10*3/uL (ref 4.0–10.5)
nRBC: 0 % (ref 0.0–0.2)

## 2019-11-26 LAB — RETICULOCYTES
Immature Retic Fract: 11.6 % (ref 2.3–15.9)
RBC.: 3.54 MIL/uL — ABNORMAL LOW (ref 4.22–5.81)
Retic Count, Absolute: 85.7 10*3/uL (ref 19.0–186.0)
Retic Ct Pct: 2.4 % (ref 0.4–3.1)

## 2019-11-26 LAB — BASIC METABOLIC PANEL
Anion gap: 14 (ref 5–15)
BUN: 79 mg/dL — ABNORMAL HIGH (ref 6–20)
CO2: 14 mmol/L — ABNORMAL LOW (ref 22–32)
Calcium: 6.7 mg/dL — ABNORMAL LOW (ref 8.9–10.3)
Chloride: 111 mmol/L (ref 98–111)
Creatinine, Ser: 7.48 mg/dL — ABNORMAL HIGH (ref 0.61–1.24)
GFR calc Af Amer: 9 mL/min — ABNORMAL LOW (ref >60)
GFR calc non Af Amer: 8 mL/min — ABNORMAL LOW (ref >60)
Glucose, Bld: 128 mg/dL — ABNORMAL HIGH (ref 70–99)
Potassium: 4.8 mmol/L (ref 3.5–5.1)
Sodium: 139 mmol/L (ref 135–145)

## 2019-11-26 LAB — VITAMIN B12: Vitamin B-12: 380 pg/mL (ref 180–914)

## 2019-11-26 LAB — ABO/RH: ABO/RH(D): O POS

## 2019-11-26 LAB — FERRITIN: Ferritin: 41 ng/mL (ref 24–336)

## 2019-11-26 LAB — HIV ANTIBODY (ROUTINE TESTING W REFLEX): HIV Screen 4th Generation wRfx: NONREACTIVE

## 2019-11-26 LAB — IRON AND TIBC
Iron: 24 ug/dL — ABNORMAL LOW (ref 45–182)
Saturation Ratios: 9 % — ABNORMAL LOW (ref 17.9–39.5)
TIBC: 277 ug/dL (ref 250–450)
UIBC: 253 ug/dL

## 2019-11-26 LAB — FOLATE: Folate: 9.9 ng/mL (ref >5.9)

## 2019-11-26 MED ORDER — SODIUM ZIRCONIUM CYCLOSILICATE 10 G PO PACK
10.0000 g | PACK | Freq: Two times a day (BID) | ORAL | Status: DC
  Administered 2019-11-26 – 2019-12-02 (×13): 10 g via ORAL
  Filled 2019-11-26 (×13): qty 1

## 2019-11-26 MED ORDER — DOCUSATE SODIUM 100 MG PO CAPS
100.0000 mg | ORAL_CAPSULE | Freq: Every day | ORAL | Status: DC
  Administered 2019-11-26 – 2019-11-30 (×5): 100 mg via ORAL
  Filled 2019-11-26 (×5): qty 1

## 2019-11-26 MED ORDER — PANTOPRAZOLE SODIUM 40 MG PO TBEC
40.0000 mg | DELAYED_RELEASE_TABLET | Freq: Every day | ORAL | Status: DC
  Administered 2019-11-26 – 2019-11-28 (×3): 40 mg via ORAL
  Filled 2019-11-26 (×3): qty 1

## 2019-11-26 MED ORDER — SODIUM CHLORIDE 0.9 % IV SOLN
250.0000 mg | INTRAVENOUS | Status: DC
  Administered 2019-11-26: 250 mg via INTRAVENOUS
  Filled 2019-11-26: qty 20

## 2019-11-26 MED ORDER — HYDRALAZINE HCL 50 MG PO TABS
100.0000 mg | ORAL_TABLET | Freq: Three times a day (TID) | ORAL | Status: DC
  Administered 2019-11-26 – 2019-12-02 (×19): 100 mg via ORAL
  Filled 2019-11-26 (×20): qty 2

## 2019-11-26 NOTE — Progress Notes (Signed)
Patient placed on CPAP. Pt is tolerating it well at this moment. RT will continue to monitor.

## 2019-11-26 NOTE — Progress Notes (Signed)
Progress Note    Michael [name]  NOB:[id number] DOB: 01-06-1976  DOA: 11/25/2019 PCP: [name], FNP    Brief Narrative:     Medical records reviewed and are as summarized below:  Michael [name] is an 44 y.o. male with medical history significant for hypertension, polycystic kidney disease with advanced CKD, and gout, now presenting to the emergency department for evaluation of hypertension and worsening renal failure.  Patient was following with nephrology in the outpatient setting but his management has been complicated by serial no-shows.  Over recent weeks, he has developed progressive loss of appetite, change in his taste, occasional nausea, and increased fatigue.  He develops headaches at times which she attributes to blood pressure being high.  He has occasional tingling in his bilateral feet and sometimes fingertips but no focal weakness or change in vision.  He denies chest pain or leg swelling.  He has noticed some maroon blood mixed with stool occasionally in recent months and reports that this has been more frequent in the past 2 weeks over which interval he sometimes feels the need to defecate but only passes some maroon blood and clots.  He was seen by his PCP yesterday, blood work was obtained, and he was noted to have worsening in his GFR with increased potassium and decreased bicarbonate.  Assessment/Plan:   Principal Problem:   Renal failure Active Problems:   Hypertensive crisis   Hyperkalemia   Normocytic anemia   Acute kidney injury superimposed on CKD IV-V with polycystic kidney disease; hyperkalemia; acidosis; uremia   - Presents with worsening renal function and is found to have potassium 6.5, BUN 85, and bicarb 16  - Nephrology consulting and much appreciated: He has some vomiting today, notes that he has some decreased appetite and dysgeusia.  Will do vein mapping at the least.  If still having vomiting when BP better, will likely need to start  dialysis  Hypertensive urgency  - BP as high as 213/115 in setting of worsening renal function and not taking medications for the past week  - He has had occasional HA and b/l feet tingling but not complaining of that now and denies any focal weakness or vision change, denies chest pain  - Resume hydralazine at a higher dose and Norvasc   Anemia; rectal bleeding  - Patient reports hx of occasional maroon blood mixed with stool, more frequent over the past couple weeks and sometimes only passing maroon blood and clots after an urge to defecate  - There is no hematemesis or upper abdominal pain and increased BUN likely reflects worsening renal function rather than UGIB  -IV iron x 1 - Monitor CBC -add PPI -will defer GI consult for now  obesity Body mass index is 35.37 kg/m.  Hypocalcemia -check phos -check PTH -started on calcitriol and calcium  OSA -CPAP QHS  Nausea and vomiting -mainly with position change -? Need for head CT -? From uremia -zofran PRN   Family Communication/Anticipated D/C date and plan/Code Status   DVT prophylaxis: scd- if no further GI bleeding then will start heparin in the AM Code Status: Full Code.  Disposition Plan: Status is: Inpatient  Remains inpatient appropriate because:Inpatient level of care appropriate due to severity of illness   Dispo: The patient is from: Home              Anticipated d/c is to: Home              Anticipated d/c date  is: 3 days              Patient currently is not medically stable to d/c.         Medical Consultants:    renal     Subjective:   Has nausea when he sits up, denies dizziness  Objective:    Vitals:   11/26/19 0500 11/26/19 0530 11/26/19 0600 11/26/19 0702  BP: (!) 147/81 (!) 154/92 (!) 152/90 (!) 177/93  Pulse: 72 71 79 85  Resp:   19 17  Temp:    98.1 F (36.7 C)  TempSrc:    Oral  SpO2: 97% 96% 97% 99%  Weight:    121.6 kg  Height:    6\' 1"  (1.854 m)    Intake/Output  Summary (Last 24 hours) at 11/26/2019 1122 Last data filed at 11/26/2019 0025 Gross per 24 hour  Intake 220 ml  Output --  Net 220 ml   Filed Weights   11/25/19 1814 11/26/19 0702  Weight: 122.5 kg 121.6 kg    Exam:  General: Appearance:    Obese male in no acute distress     Lungs:     respirations unlabored  Heart:    Normal heart rate. Normal rhythm. No murmurs, rubs, or gallops.   MS:   All extremities are intact.   Neurologic:   Awake, alert, oriented x 3. No apparent focal neurological           defect.     Data Reviewed:   I have personally reviewed following labs and imaging studies:  Labs: Labs show the following:   Basic Metabolic Panel: Recent Labs  Lab 11/24/19 1027 11/24/19 1027 11/25/19 1848 11/25/19 1848 11/25/19 2053 11/25/19 2053 11/25/19 2226 11/26/19 0344  NA 139  --  139  --   --   --   --  139  K 6.4*   < > 6.5*   < > 5.9*   < > 5.3* 4.8  CL 109*  --  111  --   --   --   --  111  CO2 15*  --  16*  --   --   --   --  14*  GLUCOSE 104*  --  85  --   --   --   --  128*  BUN 77*  --  85*  --   --   --   --  79*  CREATININE 7.96*  --  7.86*  --   --   --   --  7.48*  CALCIUM 6.9*  --  6.8*  --   --   --   --  6.7*  MG  --   --   --   --  1.7  --   --   --   PHOS  --   --   --   --   --   --  5.9*  --    < > = values in this interval not displayed.   GFR Estimated Creatinine Clearance: 17.2 mL/min (A) (by C-G formula based on SCr of 7.48 mg/dL (H)). Liver Function Tests: Recent Labs  Lab 11/24/19 1027 11/25/19 1848  AST 12 19  ALT 17 19  ALKPHOS 56 46  BILITOT 0.3 0.5  PROT 7.3 7.5  ALBUMIN 4.1 3.7   No results for input(s): LIPASE, AMYLASE in the last 168 hours. Recent Labs  Lab 11/25/19 2128  AMMONIA 48*   Coagulation profile No  results for input(s): INR, PROTIME in the last 168 hours.  CBC: Recent Labs  Lab 11/25/19 1848 11/26/19 0344  WBC 9.9 10.0  HGB 10.4* 9.6*  HCT 34.1* 31.3*  MCV 86.3 88.7  PLT 272 248   Cardiac  Enzymes: No results for input(s): CKTOTAL, CKMB, CKMBINDEX, TROPONINI in the last 168 hours. BNP (last 3 results) No results for input(s): PROBNP in the last 8760 hours. CBG: Recent Labs  Lab 11/25/19 1746 11/25/19 2132  GLUCAP 89 82   D-Dimer: No results for input(s): DDIMER in the last 72 hours. Hgb A1c: Recent Labs    11/24/19 1122  HGBA1C 5.6*  5.6  5.6  5.6   Lipid Profile: No results for input(s): CHOL, HDL, LDLCALC, TRIG, CHOLHDL, LDLDIRECT in the last 72 hours. Thyroid function studies: No results for input(s): TSH, T4TOTAL, T3FREE, THYROIDAB in the last 72 hours.  Invalid input(s): FREET3 Anemia work up: Recent Labs    11/26/19 0344  VITAMINB12 380  FOLATE 9.9  FERRITIN 41  TIBC 277  IRON 24*  RETICCTPCT 2.4   Sepsis Labs: Recent Labs  Lab 11/25/19 1848 11/26/19 0344  WBC 9.9 10.0    Microbiology Recent Results (from the past 240 hour(s))  SARS Coronavirus 2 by RT PCR (hospital order, performed in [facility] hospital lab) Nasopharyngeal Nasopharyngeal Swab     Status: None   Collection Time: 11/25/19  9:28 PM   Specimen: Nasopharyngeal Swab  Result Value Ref Range Status   SARS Coronavirus 2 NEGATIVE NEGATIVE Final    Comment: (NOTE) SARS-CoV-2 target nucleic acids are NOT DETECTED.  The SARS-CoV-2 RNA is generally detectable in upper and lower respiratory specimens during the acute phase of infection. The lowest concentration of SARS-CoV-2 viral copies this assay can detect is 250 copies / mL. A negative result does not preclude SARS-CoV-2 infection and should not be used as the sole basis for treatment or other patient management decisions.  A negative result may occur with improper specimen collection / handling, submission of specimen other than nasopharyngeal swab, presence of viral mutation(s) within the areas targeted by this assay, and inadequate number of viral copies (<250 copies / mL). A negative result must be combined with  clinical observations, patient history, and epidemiological information.  Fact Sheet for Patients:   StrictlyIdeas.no  Fact Sheet for Healthcare Providers: BankingDealers.co.za  This test is not yet approved or  cleared by the [place] FDA and has been authorized for detection and/or diagnosis of SARS-CoV-2 by FDA under an Emergency Use Authorization (EUA).  This EUA will remain in effect (meaning this test can be used) for the duration of the COVID-19 declaration under Section 564(b)(1) of the Act, 21 U.S.C. section 360bbb-3(b)(1), unless the authorization is terminated or revoked sooner.  Performed at [facility], [place] [address], [place], [place] [address]     Procedures and diagnostic studies:  No results found.  Medications:   . sodium chloride   Intravenous Once  . amLODipine  10 mg Oral Daily  . calcitRIOL  0.25 mcg Oral Daily  . calcium carbonate  1 tablet Oral TID  . hydrALAZINE  50 mg Oral Q8H  . sodium bicarbonate  650 mg Oral TID  . sodium chloride flush  3 mL Intravenous Q12H  . sodium chloride flush  3 mL Intravenous Q12H  . sodium zirconium cyclosilicate  10 g Oral BID   Continuous Infusions: . sodium chloride    . ferric gluconate (FERRLECIT/NULECIT) IV  LOS: 1 day   Geradine Girt  [facility]   How to contact the [facility] Attending or Consulting provider [place] or covering provider during after hours [place], for this patient?  1. Check the care team in [facility] and look for a) attending/consulting [facility] provider listed and b) the [facility] team listed 2. Log into [email or web address] and use [facility] universal password to access. If you do not have the password, please contact the hospital operator. 3. Locate the [facility] provider you are looking for under [facility] and page to a number that you can be directly reached. 4. If you still have difficulty reaching the provider, please page the [facility]  (Director on Call) for the Hospitalists listed on amion for assistance.  11/26/2019, 11:22 AM

## 2019-11-26 NOTE — ED Notes (Signed)
Over heard pt yelling at staff member and cursing loudly. Staff member exited the room and pt continued to yell profanities. This Probation officer asked pt multiple times to lower his voice and stop cursing. Pt proceeded to tell staff that this hospital is the "shittiest piece of trash" that he has ever been too. Pt continues to call staff names and tell staff how "shitties" all of [place] "motherfuckers" are. Pt instructed to stop and explained that we are doing the very best possible. This Probation officer informed the pt that cursing at staff is not going to make him feel any better nor expedite his care.

## 2019-11-26 NOTE — Telephone Encounter (Signed)
Critical Lab value is  Calcium- 6.9

## 2019-11-26 NOTE — Progress Notes (Signed)
  [facility] Progress Note    Assessment/ Plan:   **Hyperkalemia:  Medically managing - lokelma 10g given - will decrease to BID for now.  Low K diet discussed; seems outpt diet quite high K..  Will stop IVFs.  **HTN:  No RAAS inhibition with K/Cr. Resume hydralazine, amlodipine, can use PRN clonidine.  I will increase hydralazine to 100 TID    **CKD5 vs ESRD secondary to PKD:  He has some vomiting today, notes that he has some decreased appetite and dysgeusia.  Will do vein mapping at the least.  If still having vomiting when BP better, will likely need to start dialysis.    **Anemia:  Hb 10.4, check iron indices.  No indications for ESA.  GI eval per primary.  % sat low, will give IV iron  **NAGMA:  Secondary to CKD, on PO bicarb  **Hypocalcemia:  Check phos, PTH.  Start calcitriol and po Calcium.  **Gout:  Uric acid 8.2.  On allopurinol.   Subjective:    Seen and examined.  Nauseated this AM.  Hopeful his kidneys will get better.  We discussed that he is approaching ESRD from essentially untreated PKD. He will need to start dialysis soon.  He says that he "wants to survive" and that he is ready for dialysis and transplant if he can.   Objective:   BP (!) 177/93 (BP Location: Right Arm)   Pulse 85   Temp 98.1 F (36.7 C) (Oral)   Resp 17   Ht 6\' 1"  (1.854 m)   Wt 121.6 kg   SpO2 99%   BMI 35.37 kg/m   Intake/Output Summary (Last 24 hours) at 11/26/2019 1018 Last data filed at 11/26/2019 0025 Gross per 24 hour  Intake 220 ml  Output --  Net 220 ml   Weight change:   Physical Exam: Gen: lying in bed, nauseated and vomiting when sitting up today CVS: RRR Resp: clear bilaterally no c/w/r Abd: soft, mildly diffusely tender Ext: no LE edema  Imaging: No results found.  Labs: BMET Recent Labs  Lab 11/24/19 1027 11/25/19 1848 11/25/19 2053 11/25/19 2226 11/26/19 0344  NA 139 139  --   --  139  K 6.4* 6.5* 5.9* 5.3* 4.8  CL 109* 111  --   --   111  CO2 15* 16*  --   --  14*  GLUCOSE 104* 85  --   --  128*  BUN 77* 85*  --   --  79*  CREATININE 7.96* 7.86*  --   --  7.48*  CALCIUM 6.9* 6.8*  --   --  6.7*  PHOS  --   --   --  5.9*  --    CBC Recent Labs  Lab 11/25/19 1848 11/26/19 0344  WBC 9.9 10.0  HGB 10.4* 9.6*  HCT 34.1* 31.3*  MCV 86.3 88.7  PLT 272 248    Medications:    . sodium chloride   Intravenous Once  . amLODipine  10 mg Oral Daily  . calcitRIOL  0.25 mcg Oral Daily  . calcium carbonate  1 tablet Oral TID  . hydrALAZINE  50 mg Oral Q8H  . sodium bicarbonate  650 mg Oral TID  . sodium chloride flush  3 mL Intravenous Q12H  . sodium chloride flush  3 mL Intravenous Q12H      Madelon Lips, MD 11/26/2019, 10:18 AM

## 2019-11-27 ENCOUNTER — Inpatient Hospital Stay (HOSPITAL_COMMUNITY): Payer: Medicaid Other

## 2019-11-27 DIAGNOSIS — Q612 Polycystic kidney, adult type: Secondary | ICD-10-CM

## 2019-11-27 DIAGNOSIS — Z01810 Encounter for preprocedural cardiovascular examination: Secondary | ICD-10-CM

## 2019-11-27 LAB — CBC
HCT: 31 % — ABNORMAL LOW (ref 39.0–52.0)
Hemoglobin: 9.6 g/dL — ABNORMAL LOW (ref 13.0–17.0)
MCH: 26.3 pg (ref 26.0–34.0)
MCHC: 31 g/dL (ref 30.0–36.0)
MCV: 84.9 fL (ref 80.0–100.0)
Platelets: 258 10*3/uL (ref 150–400)
RBC: 3.65 MIL/uL — ABNORMAL LOW (ref 4.22–5.81)
RDW: 15.3 % (ref 11.5–15.5)
WBC: 9.7 10*3/uL (ref 4.0–10.5)
nRBC: 0 % (ref 0.0–0.2)

## 2019-11-27 LAB — BASIC METABOLIC PANEL
Anion gap: 14 (ref 5–15)
BUN: 76 mg/dL — ABNORMAL HIGH (ref 6–20)
CO2: 18 mmol/L — ABNORMAL LOW (ref 22–32)
Calcium: 7.3 mg/dL — ABNORMAL LOW (ref 8.9–10.3)
Chloride: 108 mmol/L (ref 98–111)
Creatinine, Ser: 7.45 mg/dL — ABNORMAL HIGH (ref 0.61–1.24)
GFR calc Af Amer: 9 mL/min — ABNORMAL LOW (ref >60)
GFR calc non Af Amer: 8 mL/min — ABNORMAL LOW (ref >60)
Glucose, Bld: 86 mg/dL (ref 70–99)
Potassium: 4.7 mmol/L (ref 3.5–5.1)
Sodium: 140 mmol/L (ref 135–145)

## 2019-11-27 LAB — RAPID URINE DRUG SCREEN, HOSP PERFORMED
Amphetamines: NOT DETECTED
Barbiturates: NOT DETECTED
Benzodiazepines: NOT DETECTED
Cocaine: NOT DETECTED
Opiates: NOT DETECTED
Tetrahydrocannabinol: NOT DETECTED

## 2019-11-27 LAB — VITAMIN D 25 HYDROXY (VIT D DEFICIENCY, FRACTURES): Vit D, 25-Hydroxy: 30.86 ng/mL (ref 30–100)

## 2019-11-27 LAB — PARATHYROID HORMONE, INTACT (NO CA): PTH: 101 pg/mL — ABNORMAL HIGH (ref 15–65)

## 2019-11-27 MED ORDER — OXYCODONE HCL 5 MG PO TABS
5.0000 mg | ORAL_TABLET | Freq: Once | ORAL | Status: AC
  Administered 2019-11-27: 5 mg via ORAL

## 2019-11-27 MED ORDER — HYDROMORPHONE HCL 1 MG/ML IJ SOLN
0.5000 mg | INTRAMUSCULAR | Status: DC | PRN
  Administered 2019-11-27 – 2019-11-28 (×7): 0.5 mg via INTRAVENOUS
  Filled 2019-11-27 (×7): qty 0.5

## 2019-11-27 MED ORDER — CALCITRIOL 0.25 MCG PO CAPS: 0.2500 ug | ORAL_CAPSULE | Freq: Every day | ORAL | 0 refills | Status: DC

## 2019-11-27 MED ORDER — HYDRALAZINE HCL 100 MG PO TABS
100.0000 mg | ORAL_TABLET | Freq: Three times a day (TID) | ORAL | 0 refills | Status: DC
Start: 2019-11-27 — End: 2020-01-19

## 2019-11-27 MED ORDER — DICLOFENAC SODIUM 1 % EX GEL
2.0000 g | Freq: Four times a day (QID) | CUTANEOUS | Status: DC
  Administered 2019-11-27 – 2019-12-02 (×16): 2 g via TOPICAL
  Filled 2019-11-27 (×2): qty 100

## 2019-11-27 MED ORDER — SODIUM ZIRCONIUM CYCLOSILICATE 10 G PO PACK: 10.0000 g | PACK | Freq: Two times a day (BID) | ORAL | 0 refills | Status: DC

## 2019-11-27 MED ORDER — BUSPIRONE HCL 5 MG PO TABS
5.0000 mg | ORAL_TABLET | Freq: Every day | ORAL | Status: DC
  Administered 2019-11-27 – 2019-11-29 (×3): 5 mg via ORAL
  Filled 2019-11-27 (×3): qty 1

## 2019-11-27 MED ORDER — CAMPHOR-MENTHOL 0.5-0.5 % EX LOTN
TOPICAL_LOTION | CUTANEOUS | Status: DC | PRN
  Filled 2019-11-27: qty 222

## 2019-11-27 MED ORDER — SODIUM BICARBONATE 650 MG PO TABS
650.0000 mg | ORAL_TABLET | Freq: Three times a day (TID) | ORAL | 0 refills | Status: DC
Start: 2019-11-27 — End: 2019-12-14

## 2019-11-27 MED ORDER — HEPARIN SODIUM (PORCINE) 5000 UNIT/ML IJ SOLN
5000.0000 [IU] | Freq: Three times a day (TID) | INTRAMUSCULAR | Status: DC
  Administered 2019-11-27 – 2019-12-02 (×16): 5000 [IU] via SUBCUTANEOUS
  Filled 2019-11-27 (×16): qty 1

## 2019-11-27 MED ORDER — ATENOLOL 25 MG PO TABS: 25.0000 mg | ORAL_TABLET | Freq: Every day | ORAL | 0 refills | Status: DC

## 2019-11-27 MED ORDER — SORBITOL 70 % SOLN
30.0000 mL | Freq: Every day | Status: DC | PRN
  Administered 2019-11-27 – 2019-11-29 (×2): 30 mL via ORAL
  Filled 2019-11-27 (×3): qty 30

## 2019-11-27 MED ORDER — FLEET ENEMA 7-19 GM/118ML RE ENEM: 1.0000 | ENEMA | Freq: Once | RECTAL | Status: DC

## 2019-11-27 MED ORDER — POLYETHYLENE GLYCOL 3350 17 G PO PACK
17.0000 g | PACK | Freq: Every day | ORAL | Status: DC | PRN
  Administered 2019-11-27: 17 g via ORAL
  Filled 2019-11-27: qty 1

## 2019-11-27 MED ORDER — AMLODIPINE BESYLATE 10 MG PO TABS: 10.0000 mg | ORAL_TABLET | Freq: Every day | ORAL | 0 refills | Status: DC

## 2019-11-27 MED FILL — AMLODIPINE BESYLATE 10 MG T: 10 | 30 days supply | Qty: 30 | Fill #0

## 2019-11-27 MED FILL — SODIUM BICARBONATE 650 MG T: 650 | 30 days supply | Qty: 90 | Fill #0

## 2019-11-27 MED FILL — LOKELMA 10 GM PACK: 10 | 9 days supply | Qty: 20 | Fill #0

## 2019-11-27 MED FILL — hydrALAZINE HCL 100 MG TABS: 100 | 30 days supply | Qty: 90 | Fill #0

## 2019-11-27 MED FILL — ATENOLOL 25 MG TABLET: 25 | 30 days supply | Qty: 30 | Fill #0

## 2019-11-27 MED FILL — CALCITRIOL 0.25 MCG CAPS: 0.25 | 30 days supply | Qty: 30 | Fill #0

## 2019-11-27 NOTE — Plan of Care (Signed)

## 2019-11-27 NOTE — Progress Notes (Signed)
Patient asking for enema reported would ask MD for order,pt states laxatives not working. His KUB was negative. He is asking for lotion for itching asked MD for Sarna lotion? Await response.

## 2019-11-27 NOTE — Progress Notes (Signed)
Upper Vein Mapping study completed.   See CVProc for preliminary results.   Griffin Basil, RDMS, RVT

## 2019-11-27 NOTE — Progress Notes (Signed)
Patient talking about all his stress with his newly diagnosed disease need for dialysis etc all new for him says he need help navigating all this to fix his mind around all this. He told Probation officer he was depressed what yall gonna do if I go home and kill myself. He did not give me a specific plan for such but I did alert MD he was depressed not happy about going home,in to talk with discharge planner she stated would go and talk with patient. The discharge planner stated would make a renal consult to help him with new diagnosis and navigate system.

## 2019-11-27 NOTE — Progress Notes (Signed)
Pt. Stated PRN pain med ineffective. On call for [facility] paged to make aware. See new orders.

## 2019-11-27 NOTE — Progress Notes (Addendum)
[name] Progress Note    Assessment/ Plan:   **Hyperkalemia:  Medically managing.  Continue Lokelma BID which he will need as an outpatient. Low K diet discussed; seems outpt diet quite high K..   **HTN:  No RAAS inhibition with K/Cr. Amlodipine 10 mg daily and hydralazine 100 mg TID  **CKD5 secondary to PKD:  His n/v is better with better BP control.  He has some mild itching and mild dysgeusia.  I ordered vein mapping for him 9/9.  I do not think he needs to start dialysis in the hospital.  I had a frank discussion with him today that he would probably need to start dialysis within the next month or two.  To do this, he needs an access and he needs to have regular followup with me.  He has an appointment with me 9/15 at 1:45 pm.  We discussed the importance of close followup.    **Anemia:  Hb 10.4, check iron indices.  No indications for ESA.  GI eval per primary.  % sat low, will give IV iron  **NAGMA:  Secondary to CKD, sodium bicarb 650 mg 3x daily  **Hypocalcemia:  Check phos, PTH.  on TUMS 1 tablet 3 x daily and calcitriol 0.25 mcg daily  **Gout:  Uric acid 8.2.  On allopurinol.   **Dispo: he is overwhelmed re: unemployment/ insurance issues--> will place SW consult.  From renal perspective could d/c and have the close followup with me to plan to initiate dialysis as an OP in the next month or so.   Subjective:    Seen and examined.  Overwhelmed by having no health insurance, losing unemployment, and needing to start dialysis soon.  He is able to eat and keep his food down he says.  He said that the pills upset his stomach yesterday morning.     Objective:   BP (!) 158/106 (BP Location: Right Arm)   Pulse 88   Temp 98.7 F (37.1 C) (Oral)   Resp 18   Ht 6\' 1"  (1.854 m)   Wt 120 kg Comment: scale a  SpO2 99%   BMI 34.90 kg/m   Intake/Output Summary (Last 24 hours) at 11/27/2019 1017 Last data filed at 11/27/2019 0841 Gross per 24 hour  Intake  1222.68 ml  Output 950 ml  Net 272.68 ml   Weight change: -0.862 kg  Physical Exam: Gen: lying in bed, NAD HEENT: no JVD CVS: RRR Resp: clear bilaterally no c/w/r Abd: soft, mildly diffusely tender Ext: no LE edema   Imaging: No results found.  Labs: BMET Recent Labs  Lab 11/24/19 1027 11/25/19 1848 11/25/19 2053 11/25/19 2226 11/26/19 0344 11/27/19 0456  NA 139 139  --   --  139 140  K 6.4* 6.5* 5.9* 5.3* 4.8 4.7  CL 109* 111  --   --  111 108  CO2 15* 16*  --   --  14* 18*  GLUCOSE 104* 85  --   --  128* 86  BUN 77* 85*  --   --  79* 76*  CREATININE 7.96* 7.86*  --   --  7.48* 7.45*  CALCIUM 6.9* 6.8*  --   --  6.7* 7.3*  PHOS  --   --   --  5.9*  --   --    CBC Recent Labs  Lab 11/25/19 1848 11/26/19 0344 11/27/19 0456  WBC 9.9 10.0 9.7  HGB 10.4* 9.6* 9.6*  HCT 34.1* 31.3* 31.0*  MCV 86.3  88.7 84.9  PLT 272 248 258    Medications:    . sodium chloride   Intravenous Once  . amLODipine  10 mg Oral Daily  . calcitRIOL  0.25 mcg Oral Daily  . calcium carbonate  1 tablet Oral TID  . diclofenac Sodium  2 g Topical QID  . docusate sodium  100 mg Oral Daily  . hydrALAZINE  100 mg Oral Q8H  . pantoprazole  40 mg Oral Daily  . sodium bicarbonate  650 mg Oral TID  . sodium chloride flush  3 mL Intravenous Q12H  . sodium chloride flush  3 mL Intravenous Q12H  . sodium zirconium cyclosilicate  10 g Oral BID      Madelon Lips, MD 11/27/2019, 10:17 AM

## 2019-11-27 NOTE — Progress Notes (Signed)
In to given patient something for n/v. Patient reported upset being discharged so soon as sick as he is feels like being booted out due to lack of insurance asked me have someone in authority come talk to him about this felt was not right. Writer beeped MD let her know patient upset regarding information he was given by her. He reported already depressed worried about bills etc does not need someone who does not care about his well being coming in booting him out soon to free up a hospital bed. He has family member at bedside discussing all this at present.

## 2019-11-27 NOTE — Progress Notes (Signed)
Progress Note    Michael [name]  JJO:[id number] DOB: [birth year]  DOA: 11/25/2019 PCP: [name], FNP    Brief Narrative:     Medical records reviewed and are as summarized below:  Michael [name] is an 44 y.o. male with medical history significant for hypertension, polycystic kidney disease with advanced CKD, and gout, now presenting to the emergency department for evaluation of hypertension and worsening renal failure.  Patient was following with nephrology in the outpatient setting but his management has been complicated by serial no-shows.  Over recent weeks, he has developed progressive loss of appetite, change in his taste, occasional nausea, and increased fatigue.  He develops headaches at times which she attributes to blood pressure being high.  He has occasional tingling in his bilateral feet and sometimes fingertips but no focal weakness or change in vision.  He denies chest pain or leg swelling.  He has noticed some maroon blood mixed with stool occasionally in recent months and reports that this has been more frequent in the past 2 weeks over which interval he sometimes feels the need to defecate but only passes some maroon blood and clots.  He was seen by his PCP yesterday, blood work was obtained, and he was noted to have worsening in his GFR with increased potassium and decreased bicarbonate.    Assessment/Plan:   Principal Problem:   Renal failure Active Problems:   Hypertensive crisis   Adult polycystic kidney disease   Morbid obesity ([place])   Observed sleep apnea   Hyperkalemia   Normocytic anemia   Metabolic acidosis   Acute kidney injury superimposed on CKD IV-V with polycystic kidney disease; hyperkalemia; acidosis; uremia   - Presents with worsening renal function and is found to have potassium 6.5, BUN 85, and bicarb 16  -follows with Dr. Hollie Salk at [facility] since 2019, last visit 04/2019.  Was pursuing a more naturopathic lifestyle at that time with juicing.  At  that time his Cr was 4.2, K 6.  06/22/19 Labs with PCP showed K 6.4, BUN 58, Cr 5.6.  He missed several f/u with Dr. Hollie Salk since 04/2019 and had visit scheduled for next week.  - Nephrology consulting and much appreciated: Dr. Hollie Salk: I do not think he needs to start dialysis in the hospital.  I had a frank discussion with him today that he would probably need to start dialysis within the next month or two.  To do this, he needs an access and he needs to have regular followup with me.  He has an appointment with me 9/15 at 1:45 pm.  We discussed the importance of close followup. -difficult situation as patient reports different symptoms to different people  Depression/anxiety -under a large amount of stress: financial, health etc -reported suicidal thoughts -psych consult  Hypertensive urgency  - BP as high as 213/115 in setting of worsening renal function and not taking medications for the past week  - He has had occasional HA and b/l feet tingling but not complaining of that now and denies any focal weakness or vision change, denies chest pain  - Resume hydralazine at a higher dose and Norvasc   Anemia; rectal bleeding  - Patient reports hx of occasional maroon blood mixed with stool, more frequent over the past couple weeks and sometimes only passing maroon blood and clots after an urge to defecate  - There is no hematemesis or upper abdominal pain and increased BUN likely reflects worsening renal function rather than UGIB  -  IV iron x 1 - Monitor CBC -add PPI -will defer GI consult for now  obesity Body mass index is 34.9 kg/m.  Hypocalcemia -phos high -PTH high -started on calcitriol and calcium  OSA -CPAP QHS  Nausea and vomiting -check x ray -UDS -PRN zofran   Family Communication/Anticipated D/C date and plan/Code Status   DVT prophylaxis: heparin Code Status: Full Code.  Disposition Plan: Status is: Inpatient  Remains inpatient appropriate because:Inpatient level of  care appropriate due to severity of illness   Dispo: The patient is from: Home              Anticipated d/c is to: Home              Anticipated d/c date is: 3 days              Patient currently is not medically stable to d/c.         Medical Consultants:   Renal psych    Subjective:   Very anxious, wanting the hospital to help with medicaid and diability  Objective:    Vitals:   11/27/19 0017 11/27/19 0524 11/27/19 0740 11/27/19 1105  BP: (!) 160/92 (!) 155/99 (!) 158/106 (!) 159/91  Pulse: 74 72 88 79  Resp: 20 18 18 19   Temp: (!) 97.5 F (36.4 C) 98.2 F (36.8 C) 98.7 F (37.1 C) 98.6 F (37 C)  TempSrc:  Oral Oral Oral  SpO2: 97% 98% 99% 98%  Weight:  120 kg    Height:        Intake/Output Summary (Last 24 hours) at 11/27/2019 1616 Last data filed at 11/27/2019 1000 Gross per 24 hour  Intake 900 ml  Output 1100 ml  Net -200 ml   Filed Weights   11/25/19 1814 11/26/19 0702 11/27/19 0524  Weight: 122.5 kg 121.6 kg 120 kg    Exam:   General: Appearance:    Obese male anxious appearing   +BS, soft, obese  Lungs:     respirations unlabored  Heart:    Normal heart rate.    MS:   All extremities are intact.   Neurologic:   Awake, alert, oriented x 3. No apparent focal neurological           defect.    Data Reviewed:   I have personally reviewed following labs and imaging studies:  Labs: Labs show the following:   Basic Metabolic Panel: Recent Labs  Lab 11/24/19 1027 11/24/19 1027 11/25/19 1848 11/25/19 1848 11/25/19 2053 11/25/19 2053 11/25/19 2226 11/25/19 2226 11/26/19 0344 11/27/19 0456  NA 139  --  139  --   --   --   --   --  139 140  K 6.4*   < > 6.5*   < > 5.9*   < > 5.3*   < > 4.8 4.7  CL 109*  --  111  --   --   --   --   --  111 108  CO2 15*  --  16*  --   --   --   --   --  14* 18*  GLUCOSE 104*  --  85  --   --   --   --   --  128* 86  BUN 77*  --  85*  --   --   --   --   --  79* 76*  CREATININE 7.96*  --  7.86*  --    --   --   --   --  7.48* 7.45*  CALCIUM 6.9*  --  6.8*  --   --   --   --   --  6.7* 7.3*  MG  --   --   --   --  1.7  --   --   --   --   --   PHOS  --   --   --   --   --   --  5.9*  --   --   --    < > = values in this interval not displayed.   GFR Estimated Creatinine Clearance: 17.2 mL/min (A) (by C-G formula based on SCr of 7.45 mg/dL (H)). Liver Function Tests: Recent Labs  Lab 11/24/19 1027 11/25/19 1848  AST 12 19  ALT 17 19  ALKPHOS 56 46  BILITOT 0.3 0.5  PROT 7.3 7.5  ALBUMIN 4.1 3.7   No results for input(s): LIPASE, AMYLASE in the last 168 hours. Recent Labs  Lab 11/25/19 2128  AMMONIA 48*   Coagulation profile No results for input(s): INR, PROTIME in the last 168 hours.  CBC: Recent Labs  Lab 11/25/19 1848 11/26/19 0344 11/27/19 0456  WBC 9.9 10.0 9.7  HGB 10.4* 9.6* 9.6*  HCT 34.1* 31.3* 31.0*  MCV 86.3 88.7 84.9  PLT 272 248 258   Cardiac Enzymes: No results for input(s): CKTOTAL, CKMB, CKMBINDEX, TROPONINI in the last 168 hours. BNP (last 3 results) No results for input(s): PROBNP in the last 8760 hours. CBG: Recent Labs  Lab 11/25/19 1746 11/25/19 2132  GLUCAP 89 82   D-Dimer: No results for input(s): DDIMER in the last 72 hours. Hgb A1c: No results for input(s): HGBA1C in the last 72 hours. Lipid Profile: No results for input(s): CHOL, HDL, LDLCALC, TRIG, CHOLHDL, LDLDIRECT in the last 72 hours. Thyroid function studies: No results for input(s): TSH, T4TOTAL, T3FREE, THYROIDAB in the last 72 hours.  Invalid input(s): FREET3 Anemia work up: Recent Labs    11/26/19 0344  VITAMINB12 380  FOLATE 9.9  FERRITIN 41  TIBC 277  IRON 24*  RETICCTPCT 2.4   Sepsis Labs: Recent Labs  Lab 11/25/19 1848 11/26/19 0344 11/27/19 0456  WBC 9.9 10.0 9.7    Microbiology Recent Results (from the past 240 hour(s))  SARS Coronavirus 2 by RT PCR (hospital order, performed in [facility] hospital lab) Nasopharyngeal Nasopharyngeal Swab      Status: None   Collection Time: 11/25/19  9:28 PM   Specimen: Nasopharyngeal Swab  Result Value Ref Range Status   SARS Coronavirus 2 NEGATIVE NEGATIVE Final    Comment: (NOTE) SARS-CoV-2 target nucleic acids are NOT DETECTED.  The SARS-CoV-2 RNA is generally detectable in upper and lower respiratory specimens during the acute phase of infection. The lowest concentration of SARS-CoV-2 viral copies this assay can detect is 250 copies / mL. A negative result does not preclude SARS-CoV-2 infection and should not be used as the sole basis for treatment or other patient management decisions.  A negative result may occur with improper specimen collection / handling, submission of specimen other than nasopharyngeal swab, presence of viral mutation(s) within the areas targeted by this assay, and inadequate number of viral copies (<250 copies / mL). A negative result must be combined with clinical observations, patient history, and epidemiological information.  Fact Sheet for Patients:   StrictlyIdeas.no  Fact Sheet for Healthcare Providers: BankingDealers.co.za  This test is not yet approved or  cleared by the [place] FDA and has been authorized  for detection and/or diagnosis of SARS-CoV-2 by FDA under an Emergency Use Authorization (EUA).  This EUA will remain in effect (meaning this test can be used) for the duration of the COVID-19 declaration under Section 564(b)(1) of the Act, 21 U.S.C. section 360bbb-3(b)(1), unless the authorization is terminated or revoked sooner.  Performed at [facility], [place] [address] [place], [place] [address]     Procedures and diagnostic studies:  VAS [place] UPPER EXT VEIN MAPPING (PRE-OP AVF)  Result Date: 11/27/2019 UPPER EXTREMITY VEIN MAPPING Performing Technologist: Griffin Basil RCT RDMS  Examination Guidelines: A complete evaluation includes B-mode imaging, spectral Doppler, color  Doppler, and power Doppler as needed of all accessible portions of each vessel. Bilateral testing is considered an integral part of a complete examination. Limited examinations for reoccurring indications may be performed as noted. +-----------------+-------------+----------+---------+ Right Cephalic   Diameter (cm)Depth (cm)Findings  +-----------------+-------------+----------+---------+ Shoulder             0.15        2.13             +-----------------+-------------+----------+---------+ Prox upper arm       0.24        2.40             +-----------------+-------------+----------+---------+ Mid upper arm        0.24        1.30   branching +-----------------+-------------+----------+---------+ Dist upper arm       0.28        1.00             +-----------------+-------------+----------+---------+ Antecubital fossa    0.16        0.60   branching +-----------------+-------------+----------+---------+ Prox forearm         0.14        0.21             +-----------------+-------------+----------+---------+ Mid forearm          0.16        0.21             +-----------------+-------------+----------+---------+ Dist forearm         0.16        0.20             +-----------------+-------------+----------+---------+ Wrist                0.17        0.27             +-----------------+-------------+----------+---------+ +-----------------+-------------+----------+---------+ Right Basilic    Diameter (cm)Depth (cm)Findings  +-----------------+-------------+----------+---------+ Shoulder             0.57                         +-----------------+-------------+----------+---------+ Prox upper arm       0.56                         +-----------------+-------------+----------+---------+ Mid upper arm        0.56                         +-----------------+-------------+----------+---------+ Dist upper arm       0.69                          +-----------------+-------------+----------+---------+ Antecubital fossa    0.46  branching +-----------------+-------------+----------+---------+ Prox forearm         0.33               branching +-----------------+-------------+----------+---------+ Mid forearm          0.36                         +-----------------+-------------+----------+---------+ Distal forearm       0.23                         +-----------------+-------------+----------+---------+ Wrist                0.19                         +-----------------+-------------+----------+---------+ +-----------------+-------------+----------+---------+ Left Cephalic    Diameter (cm)Depth (cm)Findings  +-----------------+-------------+----------+---------+ Shoulder             0.24        1.90             +-----------------+-------------+----------+---------+ Prox upper arm       0.21        0.53             +-----------------+-------------+----------+---------+ Mid upper arm        0.23        0.50             +-----------------+-------------+----------+---------+ Dist upper arm       0.26        0.51   branching +-----------------+-------------+----------+---------+ Antecubital fossa    0.19        0.39             +-----------------+-------------+----------+---------+ Prox forearm         0.15        0.21             +-----------------+-------------+----------+---------+ Mid forearm          0.10        0.19             +-----------------+-------------+----------+---------+ Dist forearm         0.13        0.21             +-----------------+-------------+----------+---------+ Wrist                0.14        0.30             +-----------------+-------------+----------+---------+ +-----------------+-------------+----------+---------+ Left Basilic     Diameter (cm)Depth (cm)Findings  +-----------------+-------------+----------+---------+ Shoulder              0.52                         +-----------------+-------------+----------+---------+ Prox upper arm       0.34                         +-----------------+-------------+----------+---------+ Mid upper arm        0.35                         +-----------------+-------------+----------+---------+ Dist upper arm       0.39                         +-----------------+-------------+----------+---------+ Antecubital fossa    0.39  branching +-----------------+-------------+----------+---------+ Prox forearm         0.30                         +-----------------+-------------+----------+---------+ Mid forearm          0.26                         +-----------------+-------------+----------+---------+ Distal forearm       0.19                         +-----------------+-------------+----------+---------+ Wrist                0.25                         +-----------------+-------------+----------+---------+ *See table(s) above for measurements and observations.  Diagnosing physician:    Preliminary     Medications:   . sodium chloride   Intravenous Once  . amLODipine  10 mg Oral Daily  . busPIRone  5 mg Oral Daily  . calcitRIOL  0.25 mcg Oral Daily  . calcium carbonate  1 tablet Oral TID  . diclofenac Sodium  2 g Topical QID  . docusate sodium  100 mg Oral Daily  . hydrALAZINE  100 mg Oral Q8H  . pantoprazole  40 mg Oral Daily  . sodium bicarbonate  650 mg Oral TID  . sodium chloride flush  3 mL Intravenous Q12H  . sodium chloride flush  3 mL Intravenous Q12H  . sodium zirconium cyclosilicate  10 g Oral BID   Continuous Infusions: . sodium chloride 250 mL (11/26/19 1228)  . ferric gluconate (FERRLECIT/NULECIT) IV 250 mg (11/26/19 1228)     LOS: 2 days   [facility]   How to contact the [facility] Attending or Consulting provider [facility] or covering provider during after hours [place], for this patient?  1. Check the care team  in [facility] and look for a) attending/consulting [facility] provider listed and b) the [facility] team listed 2. Log into [email or web address] and use [facility] universal password to access. If you do not have the password, please contact the hospital operator. 3. Locate the [facility] provider you are looking for under [facility] and page to a number that you can be directly reached. 4. If you still have difficulty reaching the provider, please page the [facility] (Director on Call) for the Hospitalists listed on amion for assistance.  11/27/2019, 4:16 PM

## 2019-11-27 NOTE — TOC Transition Note (Addendum)
Transition of Care [facility]) - CM/SW Discharge Note   Patient Details  Name: Michael [name] MRN: [id number] Date of Birth: 08-14-1975  Transition of Care [facility]) CM/SW Contact:  Zenon Mayo, RN Phone Number: 11/27/2019, 12:33 PM   Clinical Narrative:    Patient is for possible dc today, TOC to do medications, NCM will assist with Match .  NCM also made referral to financial counselor  To contact patient about Medicaid.    Final next level of care: Home/Self Care Barriers to Discharge: Continued Medical Work up   Patient Goals and CMS Choice     Choice offered to / list presented to : NA  Discharge Placement                       Discharge Plan and Services                  DME Agency: NA       HH Arranged: NA          Social Determinants of Health (SDOH) Interventions     Readmission Risk Interventions No flowsheet data found.

## 2019-11-27 NOTE — Progress Notes (Signed)
Patient declined ambulation with writer today reports can walk to bathroom and back ok short distance only he becomes nauseated starts vomiting has to lay down states can't make it up and down hallway to walk makes me sick. I have given him Zofran twice this shift due to c/o's.

## 2019-11-27 NOTE — Progress Notes (Signed)
Pt. C/o constipation. Requesting Laxative. On call for [facility] paged to make aware.

## 2019-11-27 NOTE — Progress Notes (Signed)
Patient has called out multiple times to report he is in pain offered the prn oxycodone he states does not work,asked if he could get dilaudid reported had to beep MD-I did she called back reported attending to emergency as soon as she is free would be glad see about patient's pain med and c/o. So Probation officer informed patient he has been given Zofran for c/o n/v.

## 2019-11-27 NOTE — Plan of Care (Signed)
  Problem: Education: Goal: Knowledge of General Education information will improve Description Including pain rating scale, medication(s)/side effects and non-pharmacologic comfort measures Outcome: Progressing   

## 2019-11-28 DIAGNOSIS — F4323 Adjustment disorder with mixed anxiety and depressed mood: Secondary | ICD-10-CM

## 2019-11-28 LAB — CBC
HCT: 33.8 % — ABNORMAL LOW (ref 39.0–52.0)
Hemoglobin: 10.5 g/dL — ABNORMAL LOW (ref 13.0–17.0)
MCH: 26.6 pg (ref 26.0–34.0)
MCHC: 31.1 g/dL (ref 30.0–36.0)
MCV: 85.6 fL (ref 80.0–100.0)
Platelets: 262 10*3/uL (ref 150–400)
RBC: 3.95 MIL/uL — ABNORMAL LOW (ref 4.22–5.81)
RDW: 15.4 % (ref 11.5–15.5)
WBC: 9.7 10*3/uL (ref 4.0–10.5)
nRBC: 0 % (ref 0.0–0.2)

## 2019-11-28 LAB — BASIC METABOLIC PANEL
Anion gap: 14 (ref 5–15)
BUN: 72 mg/dL — ABNORMAL HIGH (ref 6–20)
CO2: 19 mmol/L — ABNORMAL LOW (ref 22–32)
Calcium: 7.7 mg/dL — ABNORMAL LOW (ref 8.9–10.3)
Chloride: 107 mmol/L (ref 98–111)
Creatinine, Ser: 7.84 mg/dL — ABNORMAL HIGH (ref 0.61–1.24)
GFR calc Af Amer: 9 mL/min — ABNORMAL LOW (ref >60)
GFR calc non Af Amer: 8 mL/min — ABNORMAL LOW (ref >60)
Glucose, Bld: 90 mg/dL (ref 70–99)
Potassium: 4.9 mmol/L (ref 3.5–5.1)
Sodium: 140 mmol/L (ref 135–145)

## 2019-11-28 MED ORDER — PANTOPRAZOLE SODIUM 40 MG PO TBEC
40.0000 mg | DELAYED_RELEASE_TABLET | Freq: Two times a day (BID) | ORAL | Status: DC
  Administered 2019-11-28 – 2019-12-02 (×9): 40 mg via ORAL
  Filled 2019-11-28 (×9): qty 1

## 2019-11-28 NOTE — Progress Notes (Signed)
Patient reports feels like has temp,he has recently taken tylenol for headache reported would recheck temp when its effect had worn off right now temp 99.0. Patient also reports right side of lung hurts when takes a deep breath at times reported would alert MD. He reports relief from enema last hs had 2 BM's abd remains distended. No further changes noted.

## 2019-11-28 NOTE — Plan of Care (Signed)
  Problem: Education: Goal: Knowledge of General Education information will improve Description: Including pain rating scale, medication(s)/side effects and non-pharmacologic comfort measures Outcome: Progressing   Problem: Health Behavior/Discharge Planning: Goal: Ability to manage health-related needs will improve Outcome: Progressing   Problem: Elimination: Goal: Will not experience complications related to bowel motility Outcome: Progressing   

## 2019-11-28 NOTE — Progress Notes (Addendum)
[facility] Progress Note    Assessment/ Plan:    **Hyperkalemia:  Medically managing.  Continue Lokelma BID which he will need as an outpatient. Needs a low K diet as OP which we discussed.    **HTN:  No RAAS inhibition with K/Cr. Amlodipine 10 mg daily and hydralazine 100 mg TID  **CKD5 secondary to PKD:  His n/v is better with better BP control.  He has some mild itching and mild dysgeusia.  S/p vein mapping.  I do not think he needs to start dialysis in the hospital.  I had a frank discussion with him today that he would probably need to start dialysis within the next month or two.  To do this, he needs an access and he needs to have regular followup with me.  He has an appointment with me 9/15 at 1:45 pm.  He gives me assurances today that he will keep his appt so we can start the OP dialysis process.  **Anemia:  Hb 10.4, check iron indices.  No indications for ESA.  GI eval per primary.  % sat low, will give IV iron, got 638 ferrlicet 9/9, will order for 9/11 too  **NAGMA:  Secondary to CKD, sodium bicarb 650 mg 3x daily  **Hypocalcemia:  Check phos, PTH.  on TUMS 1 tablet 3 x daily and calcitriol 0.25 mcg daily  **Gout:  Uric acid 8.2.  On allopurinol.   **Abd soreness: AXR negative, says he's sore on the sides.  ? D/t large kidneys from PKD.  **Dispo: he is overwhelmed re: unemployment/ insurance issues--> will place SW consult.  From renal perspective could d/c and have the close followup with me to plan to initiate dialysis as an OP in the next month or so.   Subjective:    Feeling much better today.  Eating, BP improved, UOP OK.  K down.  Still having occasional headaches accompanied by nausea   Objective:   BP 130/77 (BP Location: Right Arm)   Pulse 97   Temp 99 F (37.2 C) (Oral)   Resp 20   Ht 6\' 1"  (1.854 m)   Wt 120 kg   SpO2 97%   BMI 34.90 kg/m   Intake/Output Summary (Last 24 hours) at 11/28/2019 1002 Last data filed at 11/28/2019  0849 Gross per 24 hour  Intake 849 ml  Output 1300 ml  Net -451 ml   Weight change: -1.633 kg  Physical Exam: Gen: lying in bed, NAD HEENT: no JVD CVS: RRR Resp: clear bilaterally no c/w/r Abd: soft, mildly diffusely tender Ext: no LE edema   Imaging: DG Abd 1 View  Result Date: 11/27/2019 CLINICAL DATA:  Nausea EXAM: X-RAY ABDOMEN 1 VIEW COMPARISON:  03/28/2017 FINDINGS: The bowel gas pattern is normal. No radio-opaque calculi or other significant radiographic abnormality are seen. IMPRESSION: Negative. Electronically Signed   By: Donavan Foil M.D.   On: 11/27/2019 18:18   VAS [place] UPPER EXT VEIN MAPPING (PRE-OP AVF)  Result Date: 11/27/2019 UPPER EXTREMITY VEIN MAPPING Performing Technologist: Griffin Basil RCT RDMS  Examination Guidelines: A complete evaluation includes B-mode imaging, spectral Doppler, color Doppler, and power Doppler as needed of all accessible portions of each vessel. Bilateral testing is considered an integral part of a complete examination. Limited examinations for reoccurring indications may be performed as noted. +-----------------+-------------+----------+---------+ Right Cephalic   Diameter (cm)Depth (cm)Findings  +-----------------+-------------+----------+---------+ Shoulder             0.15  2.13             +-----------------+-------------+----------+---------+ Prox upper arm       0.24        2.40             +-----------------+-------------+----------+---------+ Mid upper arm        0.24        1.30   branching +-----------------+-------------+----------+---------+ Dist upper arm       0.28        1.00             +-----------------+-------------+----------+---------+ Antecubital fossa    0.16        0.60   branching +-----------------+-------------+----------+---------+ Prox forearm         0.14        0.21             +-----------------+-------------+----------+---------+ Mid forearm          0.16        0.21              +-----------------+-------------+----------+---------+ Dist forearm         0.16        0.20             +-----------------+-------------+----------+---------+ Wrist                0.17        0.27             +-----------------+-------------+----------+---------+ +-----------------+-------------+----------+---------+ Right Basilic    Diameter (cm)Depth (cm)Findings  +-----------------+-------------+----------+---------+ Shoulder             0.57                         +-----------------+-------------+----------+---------+ Prox upper arm       0.56                         +-----------------+-------------+----------+---------+ Mid upper arm        0.56                         +-----------------+-------------+----------+---------+ Dist upper arm       0.69                         +-----------------+-------------+----------+---------+ Antecubital fossa    0.46               branching +-----------------+-------------+----------+---------+ Prox forearm         0.33               branching +-----------------+-------------+----------+---------+ Mid forearm          0.36                         +-----------------+-------------+----------+---------+ Distal forearm       0.23                         +-----------------+-------------+----------+---------+ Wrist                0.19                         +-----------------+-------------+----------+---------+ +-----------------+-------------+----------+---------+ Left Cephalic    Diameter (cm)Depth (cm)Findings  +-----------------+-------------+----------+---------+ Shoulder             0.24  1.90             +-----------------+-------------+----------+---------+ Prox upper arm       0.21        0.53             +-----------------+-------------+----------+---------+ Mid upper arm        0.23        0.50             +-----------------+-------------+----------+---------+ Dist upper arm        0.26        0.51   branching +-----------------+-------------+----------+---------+ Antecubital fossa    0.19        0.39             +-----------------+-------------+----------+---------+ Prox forearm         0.15        0.21             +-----------------+-------------+----------+---------+ Mid forearm          0.10        0.19             +-----------------+-------------+----------+---------+ Dist forearm         0.13        0.21             +-----------------+-------------+----------+---------+ Wrist                0.14        0.30             +-----------------+-------------+----------+---------+ +-----------------+-------------+----------+---------+ Left Basilic     Diameter (cm)Depth (cm)Findings  +-----------------+-------------+----------+---------+ Shoulder             0.52                         +-----------------+-------------+----------+---------+ Prox upper arm       0.34                         +-----------------+-------------+----------+---------+ Mid upper arm        0.35                         +-----------------+-------------+----------+---------+ Dist upper arm       0.39                         +-----------------+-------------+----------+---------+ Antecubital fossa    0.39               branching +-----------------+-------------+----------+---------+ Prox forearm         0.30                         +-----------------+-------------+----------+---------+ Mid forearm          0.26                         +-----------------+-------------+----------+---------+ Distal forearm       0.19                         +-----------------+-------------+----------+---------+ Wrist                0.25                         +-----------------+-------------+----------+---------+ *See table(s) above for measurements and observations.  Diagnosing physician:  Monica Martinez MD Electronically signed by Monica Martinez MD on  11/27/2019 at 4:40:42 PM.    Final     Labs: BMET Recent Labs  Lab 11/24/19 1027 11/25/19 1848 11/25/19 2053 11/25/19 2226 11/26/19 0344 11/27/19 0456 11/28/19 0604  NA 139 139  --   --  139 140 140  K 6.4* 6.5* 5.9* 5.3* 4.8 4.7 4.9  CL 109* 111  --   --  111 108 107  CO2 15* 16*  --   --  14* 18* 19*  GLUCOSE 104* 85  --   --  128* 86 90  BUN 77* 85*  --   --  79* 76* 72*  CREATININE 7.96* 7.86*  --   --  7.48* 7.45* 7.84*  CALCIUM 6.9* 6.8*  --   --  6.7* 7.3* 7.7*  PHOS  --   --   --  5.9*  --   --   --    CBC Recent Labs  Lab 11/25/19 1848 11/26/19 0344 11/27/19 0456 11/28/19 0604  WBC 9.9 10.0 9.7 9.7  HGB 10.4* 9.6* 9.6* 10.5*  HCT 34.1* 31.3* 31.0* 33.8*  MCV 86.3 88.7 84.9 85.6  PLT 272 248 258 262    Medications:    . sodium chloride   Intravenous Once  . amLODipine  10 mg Oral Daily  . busPIRone  5 mg Oral Daily  . calcitRIOL  0.25 mcg Oral Daily  . calcium carbonate  1 tablet Oral TID  . diclofenac Sodium  2 g Topical QID  . docusate sodium  100 mg Oral Daily  . heparin injection (subcutaneous)  5,000 Units Subcutaneous Q8H  . hydrALAZINE  100 mg Oral Q8H  . pantoprazole  40 mg Oral Daily  . sodium bicarbonate  650 mg Oral TID  . sodium chloride flush  3 mL Intravenous Q12H  . sodium chloride flush  3 mL Intravenous Q12H  . sodium zirconium cyclosilicate  10 g Oral BID      Madelon Lips, MD 11/28/2019, 10:02 AM

## 2019-11-28 NOTE — Plan of Care (Signed)

## 2019-11-28 NOTE — Consult Note (Signed)
[facility] Face-to-Face Psychiatry Consult   Reason for Consult:  ''Suicidal thoughts'' Referring Physician:  Eulogio Bear, DO Patient Identification: Michael [name] MRN:  [id number] Principal Diagnosis: Renal failure Diagnosis:  Principal Problem:   Renal failure Active Problems:   Hypertensive crisis   Adult polycystic kidney disease   Morbid obesity ([place])   Observed sleep apnea   Hyperkalemia   Normocytic anemia   Metabolic acidosis   Adjustment disorder with mixed anxiety and depressed mood   Total Time spent with patient: 1 hour  Subjective:   Michael [name] is a 44 y.o. male patient admitted with headache, weakness and worsening hypertension.  HPI:  Patient who reports multiple medical problem including hypertension, polycystic kidney disease with advanced CKD,  Gout who was admitted to the hospital for evaluation of hypertension and worsening renal failure.Patient denies prior history of mental illness but stated that he used to receive [facility] at [facility] [place] due to dealing with multiple medical issues. He reports that he reached his breaking point yesterday and it dawn on him that he may soon need to be on dialysis due to worsening renal function. He also reports multiple other stressors which include financial issues and being unemployed. However, today, he reports that he has talked to people in his life who gave him hope and he is prepared to go on with his life: '' I love myself doc, I don't want to die, believe me.'' Patient report occasional depressive symptoms and anxiety when he thinks about his medical issues but denies suicidal thoughts, plan or intent. Also, he denies psychosis, delusions, alcohol or illicit drug use.  Past Psychiatric History: none reported  Risk to Self:  denies  Risk to Others:  denies Prior Inpatient Therapy:   none Prior Outpatient Therapy:   at [facility] [place]  Past Medical History:  Past Medical  History:  Diagnosis Date  . CKD (chronic kidney disease)   . Hypertension   . OSA (obstructive sleep apnea)   . Polycystic kidney disease   . Vitamin D deficiency 11/2018   History reviewed. No pertinent surgical history. Family History:  Family History  Problem Relation Age of Onset  . Bipolar disorder Father    Family Psychiatric  History: denies Social History:  Social History   Substance and Sexual Activity  Alcohol Use Yes   Comment: wine occ      Social History   Substance and Sexual Activity  Drug Use No    Social History   Socioeconomic History  . Marital status: Single    Spouse name: Not on file  . Number of children: Not on file  . Years of education: Not on file  . Highest education level: Not on file  Occupational History  . Not on file  Tobacco Use  . Smoking status: Former Research scientist (life sciences)  . Smokeless tobacco: Never Used  Vaping Use  . Vaping Use: Never used  Substance and Sexual Activity  . Alcohol use: Yes    Comment: wine occ   . Drug use: No  . Sexual activity: Yes  Other Topics Concern  . Not on file  Social History Narrative  . Not on file   Social Determinants of Health   Financial Resource Strain:   . Difficulty of Paying Living Expenses: Not on file  Food Insecurity:   . Worried About Charity fundraiser in the Last Year: Not on file  . Ran Out of Food in the Last Year:  Not on file  Transportation Needs:   . Lack of Transportation (Medical): Not on file  . Lack of Transportation (Non-Medical): Not on file  Physical Activity:   . Days of Exercise per Week: Not on file  . Minutes of Exercise per Session: Not on file  Stress:   . Feeling of Stress : Not on file  Social Connections:   . Frequency of Communication with Friends and Family: Not on file  . Frequency of Social Gatherings with Friends and Family: Not on file  . Attends Religious Services: Not on file  . Active Member of Clubs or Organizations: Not on file  . Attends Theatre manager Meetings: Not on file  . Marital Status: Not on file   Additional Social History:    Allergies:  No Known Allergies  Labs:  Results for orders placed or performed during the hospital encounter of 11/25/19 (from the past 48 hour(s))  Basic metabolic panel     Status: Abnormal   Collection Time: 11/27/19  4:56 AM  Result Value Ref Range   Sodium 140 135 - 145 mmol/L   Potassium 4.7 3.5 - 5.1 mmol/L   Chloride 108 98 - 111 mmol/L   CO2 18 (L) 22 - 32 mmol/L   Glucose, Bld 86 70 - 99 mg/dL    Comment: Glucose reference range applies only to samples taken after fasting for at least 8 hours.   BUN 76 (H) 6 - 20 mg/dL   Creatinine, Ser 7.45 (H) 0.61 - 1.24 mg/dL   Calcium 7.3 (L) 8.9 - 10.3 mg/dL   GFR calc non Af Amer 8 (L) >60 mL/min   GFR calc Af Amer 9 (L) >60 mL/min   Anion gap 14 5 - 15    Comment: Performed at [facility] [address], [place], [place] [address]  CBC     Status: Abnormal   Collection Time: 11/27/19  4:56 AM  Result Value Ref Range   WBC 9.7 4.0 - 10.5 K/uL   RBC 3.65 (L) 4.22 - 5.81 MIL/uL   Hemoglobin 9.6 (L) 13.0 - 17.0 g/dL   HCT 31.0 (L) 39 - 52 %   MCV 84.9 80.0 - 100.0 fL   MCH 26.3 26.0 - 34.0 pg   MCHC 31.0 30.0 - 36.0 g/dL   RDW 15.3 11.5 - 15.5 %   Platelets 258 150 - 400 K/uL   nRBC 0.0 0.0 - 0.2 %    Comment: Performed at [facility], [place] [address] [place], [place] [address]  VITAMIN D 25 Hydroxy (Vit-D Deficiency, Fractures)     Status: None   Collection Time: 11/27/19  4:56 AM  Result Value Ref Range   Vit D, 25-Hydroxy 30.86 30 - 100 ng/mL    Comment: (NOTE) Vitamin D deficiency has been defined by the [facility] practice guideline as a level of serum 25-OH  vitamin D less than 20 ng/mL (1,2). The Endocrine Society went on to  further define vitamin D insufficiency as a level between 21 and 29  ng/mL (2).  1. IOM (Institute of Medicine). 2010. Dietary reference  intakes for  calcium and D. [place]: The [employer]. 2. Holick MF, Binkley Lipscomb, Bischoff-Ferrari HA, et al. Evaluation,  treatment, and prevention of vitamin D deficiency: an Endocrine  Society clinical practice guideline, JCEM. 2011 Jul; 96(7): 1911-30.  Performed at [facility], [place] [address] [place], [place]  [address]   Urine rapid drug screen (hosp performed)     Status: None   Collection Time: 11/27/19  4:51 PM  Result Value Ref Range   Opiates NONE DETECTED NONE DETECTED   Cocaine NONE DETECTED NONE DETECTED   Benzodiazepines NONE DETECTED NONE DETECTED   Amphetamines NONE DETECTED NONE DETECTED   Tetrahydrocannabinol NONE DETECTED NONE DETECTED   Barbiturates NONE DETECTED NONE DETECTED    Comment: (NOTE) DRUG SCREEN FOR MEDICAL PURPOSES ONLY.  IF CONFIRMATION IS NEEDED FOR ANY PURPOSE, NOTIFY LAB WITHIN 5 DAYS.  LOWEST DETECTABLE LIMITS FOR URINE DRUG SCREEN Drug Class                     Cutoff (ng/mL) Amphetamine and metabolites    1000 Barbiturate and metabolites    200 Benzodiazepine                 188 Tricyclics and metabolites     300 Opiates and metabolites        300 Cocaine and metabolites        300 THC                            50 Performed at [facility], [place] [address], [place], [place] [address]   Basic metabolic panel     Status: Abnormal   Collection Time: 11/28/19  6:04 AM  Result Value Ref Range   Sodium 140 135 - 145 mmol/L   Potassium 4.9 3.5 - 5.1 mmol/L   Chloride 107 98 - 111 mmol/L   CO2 19 (L) 22 - 32 mmol/L   Glucose, Bld 90 70 - 99 mg/dL    Comment: Glucose reference range applies only to samples taken after fasting for at least 8 hours.   BUN 72 (H) 6 - 20 mg/dL   Creatinine, Ser 7.84 (H) 0.61 - 1.24 mg/dL   Calcium 7.7 (L) 8.9 - 10.3 mg/dL   GFR calc non Af Amer 8 (L) >60 mL/min   GFR calc Af Amer 9 (L) >60 mL/min   Anion gap 14 5 - 15    Comment: Performed at [facility]  [address] [place], [place] [address]  CBC     Status: Abnormal   Collection Time: 11/28/19  6:04 AM  Result Value Ref Range   WBC 9.7 4.0 - 10.5 K/uL   RBC 3.95 (L) 4.22 - 5.81 MIL/uL   Hemoglobin 10.5 (L) 13.0 - 17.0 g/dL   HCT 33.8 (L) 39 - 52 %   MCV 85.6 80.0 - 100.0 fL   MCH 26.6 26.0 - 34.0 pg   MCHC 31.1 30.0 - 36.0 g/dL   RDW 15.4 11.5 - 15.5 %   Platelets 262 150 - 400 K/uL   nRBC 0.0 0.0 - 0.2 %    Comment: Performed at [facility], [place] [address], [place], [place] [address]    Current Facility-Administered Medications  Medication Dose Route Frequency Provider Last Rate Last Admin  . 0.9 %  sodium chloride infusion (Manually program via Guardrails IV Fluids)   Intravenous Once Vianne Bulls, MD   Held at 11/25/19 2252  . 0.9 %  sodium chloride infusion  250 mL Intravenous PRN Opyd, Ilene Qua, MD 10 mL/hr at 11/26/19 1228 250 mL at 11/26/19 1228  . acetaminophen (TYLENOL) tablet 650 mg  650 mg Oral Q6H PRN Opyd, Ilene Qua, MD  650 mg at 11/28/19 0754   Or  . acetaminophen (TYLENOL) suppository 650 mg  650 mg Rectal Q6H PRN Opyd, Ilene Qua, MD      . amLODipine (NORVASC) tablet 10 mg  10 mg Oral Daily [name], MD   10 mg at 11/28/19 0756  . busPIRone (BUSPAR) tablet 5 mg  5 mg Oral Daily Eulogio Bear U, DO   5 mg at 11/28/19 0756  . calcitRIOL (ROCALTROL) capsule 0.25 mcg  0.25 mcg Oral Daily [name], MD   0.25 mcg at 11/28/19 0756  . calcium carbonate (TUMS - dosed in mg elemental calcium) chewable tablet 200 mg of elemental calcium  1 tablet Oral TID [name], MD   200 mg of elemental calcium at 11/28/19 0755  . camphor-menthol (SARNA) lotion   Topical PRN Geradine Girt, DO   Given at 11/28/19 9024  . diclofenac Sodium (VOLTAREN) 1 % topical gel 2 g  2 g Topical QID Vann, Jessica U, DO   2 g at 11/28/19 0829  . docusate sodium (COLACE) capsule 100 mg  100 mg Oral Daily Eulogio Bear U, DO   100 mg at 11/28/19 0755  . ferric gluconate  (NULECIT) 250 mg in sodium chloride 0.9 % 100 mL IVPB  250 mg Intravenous Weekly Madelon Lips, MD 120 mL/hr at 11/26/19 1228 250 mg at 11/26/19 1228  . heparin injection 5,000 Units  5,000 Units Subcutaneous Q8H Vann, Jessica U, DO   5,000 Units at 11/28/19 0543  . hydrALAZINE (APRESOLINE) tablet 100 mg  100 mg Oral Q8H Vann, Jessica U, DO   100 mg at 11/28/19 0544  . HYDROmorphone (DILAUDID) injection 0.5 mg  0.5 mg Intravenous Q4H PRN Eulogio Bear U, DO   0.5 mg at 11/28/19 1108  . ondansetron (ZOFRAN) tablet 4 mg  4 mg Oral Q6H PRN Opyd, Ilene Qua, MD       Or  . ondansetron (ZOFRAN) injection 4 mg  4 mg Intravenous Q6H PRN Opyd, Ilene Qua, MD   4 mg at 11/28/19 0825  . oxyCODONE (Oxy IR/ROXICODONE) immediate release tablet 5 mg  5 mg Oral Q6H PRN Opyd, Ilene Qua, MD   5 mg at 11/28/19 0824  . pantoprazole (PROTONIX) EC tablet 40 mg  40 mg Oral BID AC Vann, Jessica U, DO      . sodium bicarbonate tablet 650 mg  650 mg Oral TID [name], MD   650 mg at 11/28/19 0755  . sodium chloride flush (NS) 0.9 % injection 3 mL  3 mL Intravenous Q12H Opyd, Ilene Qua, MD   3 mL at 11/28/19 0757  . sodium chloride flush (NS) 0.9 % injection 3 mL  3 mL Intravenous Q12H Opyd, Ilene Qua, MD   3 mL at 11/27/19 2242  . sodium chloride flush (NS) 0.9 % injection 3 mL  3 mL Intravenous PRN Opyd, Ilene Qua, MD   3 mL at 11/27/19 2241  . sodium zirconium cyclosilicate (LOKELMA) packet 10 g  10 g Oral BID Madelon Lips, MD   10 g at 11/28/19 0754  . sorbitol 70 % solution 30 mL  30 mL Oral Daily PRN Eulogio Bear U, DO   30 mL at 11/27/19 1132    Musculoskeletal: Strength & Muscle Tone: within normal limits Gait & Station: normal Patient leans: N/A  Psychiatric Specialty Exam: Physical Exam Psychiatric:        Attention and Perception: Attention and perception normal.  Mood and Affect: Mood and affect normal.        Speech: Speech normal.        Behavior: Behavior normal. Behavior is  cooperative.        Thought Content: Thought content normal.        Cognition and Memory: Cognition and memory normal.        Judgment: Judgment normal.     Review of Systems  Constitutional: Negative.   HENT: Negative.   Eyes: Negative.   Endocrine: Negative.   Psychiatric/Behavioral: Negative.     Blood pressure 130/77, pulse 97, temperature 99 F (37.2 C), temperature source Oral, resp. rate 20, height 6\' 1"  (1.854 m), weight 120 kg, SpO2 97 %.Body mass index is 34.9 kg/m.  General Appearance: Casual  Eye Contact:  Good  Speech:  Clear and Coherent  Volume:  Normal  Mood:  Euthymic  Affect:  Appropriate  Thought Process:  Coherent, Goal Directed and Linear  Orientation:  Full (Time, Place, and Person)  Thought Content:  Logical  Suicidal Thoughts:  No  Homicidal Thoughts:  No  Memory:  Immediate;   Good Recent;   Good Remote;   Good  Judgement:  Intact  Insight:  Fair  Psychomotor Activity:  Normal  Concentration:  Concentration: Good and Attention Span: Good  Recall:  Good  Fund of Knowledge:  Good  Language:  Good  Akathisia:  No  Handed:  Right  AIMS (if indicated):     Assets:  Communication Skills Desire for Improvement Social Support Others:  family support  ADL's:  Intact  Cognition:  WNL  Sleep:   good     Treatment Plan Summary: 44 year old male with multiple medical problem who was admitted due to worsening renal failure. He reports that he was thinking about suicide yesterday after he was overwhelmed thinking about his multiple problems. However, patient denies prior history of suicide and now denies current suicidal ideation, intent or plan. He is willing to be referred for counseling upon discharge from the hospital.   Recommendation: -Consider social worker consult to facilitate patient's referral to [facility] [place] for counsling upon discharge.  Disposition: No evidence of imminent risk to self or others at present.   Patient  does not meet criteria for psychiatric inpatient admission. Supportive therapy provided about ongoing stressors. Psychiatric service signing out. Re-consult as needed  Corena Pilgrim, MD 11/28/2019 11:47 AM

## 2019-11-28 NOTE — Progress Notes (Signed)
Progress Note    Michael [name]  FHQ:[id number] DOB: 03/18/1976  DOA: 11/25/2019 PCP: [name], FNP    Brief Narrative:     Medical records reviewed and are as summarized below:  Michael [name] is an 44 y.o. male with medical history significant for hypertension, polycystic kidney disease with advanced CKD, and gout, now presenting to the emergency department for evaluation of hypertension and worsening renal failure.  Patient was following with nephrology in the outpatient setting but his management has been complicated by serial no-shows.  Over recent weeks, he has developed progressive loss of appetite, change in his taste, occasional nausea, and increased fatigue.  He develops headaches at times which she attributes to blood pressure being high.  He has occasional tingling in his bilateral feet and sometimes fingertips but no focal weakness or change in vision.  He denies chest pain or leg swelling.  He has noticed some maroon blood mixed with stool occasionally in recent months and reports that this has been more frequent in the past 2 weeks over which interval he sometimes feels the need to defecate but only passes some maroon blood and clots.  He was seen by his PCP yesterday, blood work was obtained, and he was noted to have worsening in his GFR with increased potassium and decreased bicarbonate.    Assessment/Plan:   Principal Problem:   Renal failure Active Problems:   Hypertensive crisis   Adult polycystic kidney disease   Morbid obesity ([place])   Observed sleep apnea   Hyperkalemia   Normocytic anemia   Metabolic acidosis   Adjustment disorder with mixed anxiety and depressed mood   Acute kidney injury superimposed on CKD IV-V with polycystic kidney disease; hyperkalemia; acidosis; uremia   - Presents with worsening renal function and is found to have potassium 6.5, BUN 85, and bicarb 16  -follows with Dr. Hollie Salk at CKD since 2019, last visit 04/2019.  Was pursuing a  more naturopathic lifestyle at that time with juicing.  At that time his Cr was 4.2, K 6.  06/22/19 Labs with PCP showed K 6.4, BUN 58, Cr 5.6.  He missed several f/u with Dr. Hollie Salk since 04/2019 and had visit scheduled for next week.  - Nephrology consulting and much appreciated: Dr. Hollie Salk: I do not think he needs to start dialysis in the hospital.  I had a frank discussion with him today that he would probably need to start dialysis within the next month or two.  To do this, he needs an access and he needs to have regular followup with me.  He has an appointment with me 9/15 at 1:45 pm.  We discussed the importance of close followup. -difficult situation as patient reports different symptoms to different people: reported to me that he throws up 30-45 min after sitting up, not related to food  Depression/anxiety -under a large amount of stress: financial, health etc -reported suicidal thoughts- patient denied -psych consult appreciated  Hypertensive urgency  - BP as high as 213/115 in setting of worsening renal function and not taking medications for the past week  - He has had occasional HA and b/l feet tingling but not complaining of that now and denies any focal weakness or vision change, denies chest pain  - Resume hydralazine at a higher dose and Norvasc   Anemia; rectal bleeding  - resolved -will defer GI consult to outpatient  obesity Body mass index is 34.9 kg/m.  Hypocalcemia -phos high -PTH high -started on  calcitriol and calcium  OSA -CPAP QHS  Nausea and vomiting -not clearly related to food or position -says it happens 3-45 min after sitting up-- not food, just saliva  Constipation -good results with bowel regimen and enema   Family Communication/Anticipated D/C date and plan/Code Status   DVT prophylaxis: heparin Code Status: Full Code.  Disposition Plan: Status is: Inpatient  Remains inpatient appropriate because:Inpatient level of care appropriate due to  severity of illness   Dispo: The patient is from: Home              Anticipated d/c is to: Home              Anticipated d/c date is: 1-2 days              Patient currently is not medically stable to d/c.         Medical Consultants:   Renal psych    Subjective:   Says he is still vomiting but happens 30-45 min after sitting up  Objective:    Vitals:   11/28/19 0543 11/28/19 0543 11/28/19 0812 11/28/19 1154  BP: (!) 159/101 (!) 159/101 130/77 (!) 143/85  Pulse:  74 97 90  Resp:  20 20 20   Temp:  98.3 F (36.8 C) 99 F (37.2 C) 98.1 F (36.7 C)  TempSrc:  Oral Oral Oral  SpO2:  98% 97% 96%  Weight:      Height:        Intake/Output Summary (Last 24 hours) at 11/28/2019 1451 Last data filed at 11/28/2019 0849 Gross per 24 hour  Intake 849 ml  Output 1300 ml  Net -451 ml   Filed Weights   11/26/19 0702 11/27/19 0524 11/28/19 0150  Weight: 121.6 kg 120 kg 120 kg    Exam:  General: Appearance:    Obese male in no acute distress     Lungs:     Clear to auscultation bilaterally, respirations unlabored  Heart:    Normal heart rate. Normal rhythm. No murmurs, rubs, or gallops.   MS:   All extremities are intact.   Neurologic:   Awake, alert, oriented x 3. No apparent focal neurological           defect.     Data Reviewed:   I have personally reviewed following labs and imaging studies:  Labs: Labs show the following:   Basic Metabolic Panel: Recent Labs  Lab 11/24/19 1027 11/24/19 1027 11/25/19 1848 11/25/19 1848 11/25/19 2053 11/25/19 2053 11/25/19 2226 11/25/19 2226 11/26/19 0344 11/26/19 0344 11/27/19 0456 11/28/19 0604  NA 139  --  139  --   --   --   --   --  139  --  140 140  K 6.4*   < > 6.5*   < > 5.9*   < > 5.3*   < > 4.8   < > 4.7 4.9  CL 109*  --  111  --   --   --   --   --  111  --  108 107  CO2 15*  --  16*  --   --   --   --   --  14*  --  18* 19*  GLUCOSE 104*  --  85  --   --   --   --   --  128*  --  86 90  BUN 77*   --  85*  --   --   --   --   --  79*  --  76* 72*  CREATININE 7.96*  --  7.86*  --   --   --   --   --  7.48*  --  7.45* 7.84*  CALCIUM 6.9*  --  6.8*  --   --   --   --   --  6.7*  --  7.3* 7.7*  MG  --   --   --   --  1.7  --   --   --   --   --   --   --   PHOS  --   --   --   --   --   --  5.9*  --   --   --   --   --    < > = values in this interval not displayed.   GFR Estimated Creatinine Clearance: 16.3 mL/min (A) (by C-G formula based on SCr of 7.84 mg/dL (H)). Liver Function Tests: Recent Labs  Lab 11/24/19 1027 11/25/19 1848  AST 12 19  ALT 17 19  ALKPHOS 56 46  BILITOT 0.3 0.5  PROT 7.3 7.5  ALBUMIN 4.1 3.7   No results for input(s): LIPASE, AMYLASE in the last 168 hours. Recent Labs  Lab 11/25/19 2128  AMMONIA 48*   Coagulation profile No results for input(s): INR, PROTIME in the last 168 hours.  CBC: Recent Labs  Lab 11/25/19 1848 11/26/19 0344 11/27/19 0456 11/28/19 0604  WBC 9.9 10.0 9.7 9.7  HGB 10.4* 9.6* 9.6* 10.5*  HCT 34.1* 31.3* 31.0* 33.8*  MCV 86.3 88.7 84.9 85.6  PLT 272 248 258 262   Cardiac Enzymes: No results for input(s): CKTOTAL, CKMB, CKMBINDEX, TROPONINI in the last 168 hours. BNP (last 3 results) No results for input(s): PROBNP in the last 8760 hours. CBG: Recent Labs  Lab 11/25/19 1746 11/25/19 2132  GLUCAP 89 82   D-Dimer: No results for input(s): DDIMER in the last 72 hours. Hgb A1c: No results for input(s): HGBA1C in the last 72 hours. Lipid Profile: No results for input(s): CHOL, HDL, LDLCALC, TRIG, CHOLHDL, LDLDIRECT in the last 72 hours. Thyroid function studies: No results for input(s): TSH, T4TOTAL, T3FREE, THYROIDAB in the last 72 hours.  Invalid input(s): FREET3 Anemia work up: Recent Labs    11/26/19 0344  VITAMINB12 380  FOLATE 9.9  FERRITIN 41  TIBC 277  IRON 24*  RETICCTPCT 2.4   Sepsis Labs: Recent Labs  Lab 11/25/19 1848 11/26/19 0344 11/27/19 0456 11/28/19 0604  WBC 9.9 10.0 9.7 9.7     Microbiology Recent Results (from the past 240 hour(s))  SARS Coronavirus 2 by RT PCR (hospital order, performed in [facility] hospital lab) Nasopharyngeal Nasopharyngeal Swab     Status: None   Collection Time: 11/25/19  9:28 PM   Specimen: Nasopharyngeal Swab  Result Value Ref Range Status   SARS Coronavirus 2 NEGATIVE NEGATIVE Final    Comment: (NOTE) SARS-CoV-2 target nucleic acids are NOT DETECTED.  The SARS-CoV-2 RNA is generally detectable in upper and lower respiratory specimens during the acute phase of infection. The lowest concentration of SARS-CoV-2 viral copies this assay can detect is 250 copies / mL. A negative result does not preclude SARS-CoV-2 infection and should not be used as the sole basis for treatment or other patient management decisions.  A negative result may occur with improper specimen collection / handling, submission of specimen other than nasopharyngeal swab, presence of viral mutation(s) within the areas targeted by this assay,  and inadequate number of viral copies (<250 copies / mL). A negative result must be combined with clinical observations, patient history, and epidemiological information.  Fact Sheet for Patients:   StrictlyIdeas.no  Fact Sheet for Healthcare Providers: BankingDealers.co.za  This test is not yet approved or  cleared by the [place] FDA and has been authorized for detection and/or diagnosis of SARS-CoV-2 by FDA under an Emergency Use Authorization (EUA).  This EUA will remain in effect (meaning this test can be used) for the duration of the COVID-19 declaration under Section 564(b)(1) of the Act, 21 U.S.C. section 360bbb-3(b)(1), unless the authorization is terminated or revoked sooner.  Performed at [facility], [place] [address], [place], [place] [address]     Procedures and diagnostic studies:  DG Abd 1 View  Result Date: 11/27/2019 CLINICAL DATA:   Nausea EXAM: X-RAY ABDOMEN 1 VIEW COMPARISON:  03/28/2017 FINDINGS: The bowel gas pattern is normal. No radio-opaque calculi or other significant radiographic abnormality are seen. IMPRESSION: Negative. Electronically Signed   By: Donavan Foil M.D.   On: 11/27/2019 18:18   VAS [place] UPPER EXT VEIN MAPPING (PRE-OP AVF)  Result Date: 11/27/2019 UPPER EXTREMITY VEIN MAPPING Performing Technologist: Griffin Basil RCT RDMS  Examination Guidelines: A complete evaluation includes B-mode imaging, spectral Doppler, color Doppler, and power Doppler as needed of all accessible portions of each vessel. Bilateral testing is considered an integral part of a complete examination. Limited examinations for reoccurring indications may be performed as noted. +-----------------+-------------+----------+---------+ Right Cephalic   Diameter (cm)Depth (cm)Findings  +-----------------+-------------+----------+---------+ Shoulder             0.15        2.13             +-----------------+-------------+----------+---------+ Prox upper arm       0.24        2.40             +-----------------+-------------+----------+---------+ Mid upper arm        0.24        1.30   branching +-----------------+-------------+----------+---------+ Dist upper arm       0.28        1.00             +-----------------+-------------+----------+---------+ Antecubital fossa    0.16        0.60   branching +-----------------+-------------+----------+---------+ Prox forearm         0.14        0.21             +-----------------+-------------+----------+---------+ Mid forearm          0.16        0.21             +-----------------+-------------+----------+---------+ Dist forearm         0.16        0.20             +-----------------+-------------+----------+---------+ Wrist                0.17        0.27             +-----------------+-------------+----------+---------+  +-----------------+-------------+----------+---------+ Right Basilic    Diameter (cm)Depth (cm)Findings  +-----------------+-------------+----------+---------+ Shoulder             0.57                         +-----------------+-------------+----------+---------+ Prox upper arm  0.56                         +-----------------+-------------+----------+---------+ Mid upper arm        0.56                         +-----------------+-------------+----------+---------+ Dist upper arm       0.69                         +-----------------+-------------+----------+---------+ Antecubital fossa    0.46               branching +-----------------+-------------+----------+---------+ Prox forearm         0.33               branching +-----------------+-------------+----------+---------+ Mid forearm          0.36                         +-----------------+-------------+----------+---------+ Distal forearm       0.23                         +-----------------+-------------+----------+---------+ Wrist                0.19                         +-----------------+-------------+----------+---------+ +-----------------+-------------+----------+---------+ Left Cephalic    Diameter (cm)Depth (cm)Findings  +-----------------+-------------+----------+---------+ Shoulder             0.24        1.90             +-----------------+-------------+----------+---------+ Prox upper arm       0.21        0.53             +-----------------+-------------+----------+---------+ Mid upper arm        0.23        0.50             +-----------------+-------------+----------+---------+ Dist upper arm       0.26        0.51   branching +-----------------+-------------+----------+---------+ Antecubital fossa    0.19        0.39             +-----------------+-------------+----------+---------+ Prox forearm         0.15        0.21              +-----------------+-------------+----------+---------+ Mid forearm          0.10        0.19             +-----------------+-------------+----------+---------+ Dist forearm         0.13        0.21             +-----------------+-------------+----------+---------+ Wrist                0.14        0.30             +-----------------+-------------+----------+---------+ +-----------------+-------------+----------+---------+ Left Basilic     Diameter (cm)Depth (cm)Findings  +-----------------+-------------+----------+---------+ Shoulder             0.52                         +-----------------+-------------+----------+---------+  Prox upper arm       0.34                         +-----------------+-------------+----------+---------+ Mid upper arm        0.35                         +-----------------+-------------+----------+---------+ Dist upper arm       0.39                         +-----------------+-------------+----------+---------+ Antecubital fossa    0.39               branching +-----------------+-------------+----------+---------+ Prox forearm         0.30                         +-----------------+-------------+----------+---------+ Mid forearm          0.26                         +-----------------+-------------+----------+---------+ Distal forearm       0.19                         +-----------------+-------------+----------+---------+ Wrist                0.25                         +-----------------+-------------+----------+---------+ *See table(s) above for measurements and observations.  Diagnosing physician: Monica Martinez MD Electronically signed by Monica Martinez MD on 11/27/2019 at 4:40:42 PM.    Final     Medications:   . sodium chloride   Intravenous Once  . amLODipine  10 mg Oral Daily  . busPIRone  5 mg Oral Daily  . calcitRIOL  0.25 mcg Oral Daily  . calcium carbonate  1 tablet Oral TID  . diclofenac Sodium  2  g Topical QID  . docusate sodium  100 mg Oral Daily  . heparin injection (subcutaneous)  5,000 Units Subcutaneous Q8H  . hydrALAZINE  100 mg Oral Q8H  . pantoprazole  40 mg Oral BID AC  . sodium bicarbonate  650 mg Oral TID  . sodium chloride flush  3 mL Intravenous Q12H  . sodium chloride flush  3 mL Intravenous Q12H  . sodium zirconium cyclosilicate  10 g Oral BID   Continuous Infusions: . sodium chloride 250 mL (11/26/19 1228)  . ferric gluconate (FERRLECIT/NULECIT) IV 250 mg (11/26/19 1228)     LOS: 3 days   [place] [facility]   How to contact the [facility] Attending or Consulting provider [place] or covering provider during after hours [place], for this patient?  1. Check the care team in [facility] and look for a) attending/consulting [facility] provider listed and b) the [facility] team listed 2. Log into [email or web address] and use [place]'s universal password to access. If you do not have the password, please contact the hospital operator. 3. Locate the [facility] provider you are looking for under [facility] and page to a number that you can be directly reached. 4. If you still have difficulty reaching the provider, please page the [facility] (Director on Call) for the Hospitalists listed on amion for assistance.  11/28/2019, 2:51 PM

## 2019-11-29 DIAGNOSIS — I10 Essential (primary) hypertension: Secondary | ICD-10-CM

## 2019-11-29 LAB — BASIC METABOLIC PANEL
Anion gap: 13 (ref 5–15)
BUN: 71 mg/dL — ABNORMAL HIGH (ref 6–20)
CO2: 20 mmol/L — ABNORMAL LOW (ref 22–32)
Calcium: 7.5 mg/dL — ABNORMAL LOW (ref 8.9–10.3)
Chloride: 107 mmol/L (ref 98–111)
Creatinine, Ser: 7.93 mg/dL — ABNORMAL HIGH (ref 0.61–1.24)
GFR calc Af Amer: 9 mL/min — ABNORMAL LOW (ref >60)
GFR calc non Af Amer: 7 mL/min — ABNORMAL LOW (ref >60)
Glucose, Bld: 98 mg/dL (ref 70–99)
Potassium: 4.8 mmol/L (ref 3.5–5.1)
Sodium: 140 mmol/L (ref 135–145)

## 2019-11-29 LAB — CBC
HCT: 33.1 % — ABNORMAL LOW (ref 39.0–52.0)
Hemoglobin: 10.1 g/dL — ABNORMAL LOW (ref 13.0–17.0)
MCH: 26.2 pg (ref 26.0–34.0)
MCHC: 30.5 g/dL (ref 30.0–36.0)
MCV: 85.8 fL (ref 80.0–100.0)
Platelets: 247 10*3/uL (ref 150–400)
RBC: 3.86 MIL/uL — ABNORMAL LOW (ref 4.22–5.81)
RDW: 15.3 % (ref 11.5–15.5)
WBC: 8.6 10*3/uL (ref 4.0–10.5)
nRBC: 0 % (ref 0.0–0.2)

## 2019-11-29 MED ORDER — METOCLOPRAMIDE HCL 5 MG/ML IJ SOLN
5.0000 mg | Freq: Once | INTRAMUSCULAR | Status: AC
  Administered 2019-11-29: 5 mg via INTRAVENOUS
  Filled 2019-11-29: qty 2

## 2019-11-29 NOTE — Progress Notes (Signed)
[facility] Progress Note    Assessment/ Plan:    **Hyperkalemia:  Medically managing.  Continue Lokelma BID which he will need as an outpatient. Needs a low K diet as OP which we discussed.    **HTN:  No RAAS inhibition with K/Cr. Amlodipine 10 mg daily and hydralazine 100 mg TID  **CKD5 secondary to PKD:  His n/v is better with better BP control.  He has some mild itching and mild dysgeusia.  S/p vein mapping.  I do not think he needs to start dialysis in the hospital.  I had a frank discussion with him today that he would probably need to start dialysis within the next month or two.  To do this, he needs an access and he needs to have regular followup with me.  He has an appointment with me 9/15 at 1:45 pm.  He gives me assurances today that he will keep his appt so we can start the OP dialysis process.  **Anemia:  Hb 10.4, check iron indices.  No indications for ESA.  GI eval per primary.  % sat low, will give IV iron, got 884 ferrlicet 9/9, 1/66  **NAGMA:  Secondary to CKD, sodium bicarb 650 mg 3x daily  **Hypocalcemia:  Check phos, PTH.  on TUMS 1 tablet 3 x daily and calcitriol 0.25 mcg daily  **Gout:  Uric acid 8.2.  On allopurinol.   **Abd soreness: AXR negative, says he's sore on the sides.  ? D/t large kidneys from PKD, palpable on exam.  **Dispo: he is overwhelmed re: unemployment/ insurance issues--> will place SW consult.  From renal perspective could d/c and have the close followup with me to plan to initiate dialysis as an OP in the next month or so.  Nothing further to add, will sign off.  Call with questions.   Subjective:    Continues to improve.  UOP is good.  Cr about the same.     Objective:   BP 140/80 (BP Location: Right Arm)   Pulse 91   Temp 98.4 F (36.9 C) (Oral)   Resp 20   Ht 6\' 1"  (1.854 m)   Wt 120.1 kg   SpO2 97%   BMI 34.94 kg/m   Intake/Output Summary (Last 24 hours) at 11/29/2019 1113 Last data filed at 11/29/2019  1100 Gross per 24 hour  Intake 1320 ml  Output 2100 ml  Net -780 ml   Weight change: 0.136 kg  Physical Exam: Gen: lying in bed, NAD HEENT: no JVD CVS: RRR Resp: clear bilaterally no c/w/r Abd: soft, mildly diffusely tender, palpable kidneys bilaterally Ext: no LE edema   Imaging: DG Abd 1 View  Result Date: 11/27/2019 CLINICAL DATA:  Nausea EXAM: X-RAY ABDOMEN 1 VIEW COMPARISON:  03/28/2017 FINDINGS: The bowel gas pattern is normal. No radio-opaque calculi or other significant radiographic abnormality are seen. IMPRESSION: Negative. Electronically Signed   By: Donavan Foil M.D.   On: 11/27/2019 18:18    Labs: BMET Recent Labs  Lab 11/24/19 1027 11/24/19 1027 11/25/19 1848 11/25/19 2053 11/25/19 2226 11/26/19 0344 11/27/19 0456 11/28/19 0604 11/29/19 0546  NA 139  --  139  --   --  139 140 140 140  K 6.4*   < > 6.5* 5.9* 5.3* 4.8 4.7 4.9 4.8  CL 109*  --  111  --   --  111 108 107 107  CO2 15*  --  16*  --   --  14* 18* 19* 20*  GLUCOSE  104*  --  85  --   --  128* 86 90 98  BUN 77*  --  85*  --   --  79* 76* 72* 71*  CREATININE 7.96*  --  7.86*  --   --  7.48* 7.45* 7.84* 7.93*  CALCIUM 6.9*  --  6.8*  --   --  6.7* 7.3* 7.7* 7.5*  PHOS  --   --   --   --  5.9*  --   --   --   --    < > = values in this interval not displayed.   CBC Recent Labs  Lab 11/26/19 0344 11/27/19 0456 11/28/19 0604 11/29/19 0546  WBC 10.0 9.7 9.7 8.6  HGB 9.6* 9.6* 10.5* 10.1*  HCT 31.3* 31.0* 33.8* 33.1*  MCV 88.7 84.9 85.6 85.8  PLT 248 258 262 247    Medications:    . sodium chloride   Intravenous Once  . amLODipine  10 mg Oral Daily  . busPIRone  5 mg Oral Daily  . calcitRIOL  0.25 mcg Oral Daily  . calcium carbonate  1 tablet Oral TID  . diclofenac Sodium  2 g Topical QID  . docusate sodium  100 mg Oral Daily  . heparin injection (subcutaneous)  5,000 Units Subcutaneous Q8H  . hydrALAZINE  100 mg Oral Q8H  . pantoprazole  40 mg Oral BID AC  . sodium bicarbonate   650 mg Oral TID  . sodium chloride flush  3 mL Intravenous Q12H  . sodium chloride flush  3 mL Intravenous Q12H  . sodium zirconium cyclosilicate  10 g Oral BID      Madelon Lips, MD 11/29/2019, 11:13 AM

## 2019-11-29 NOTE — Progress Notes (Signed)
Progress Note    Michael [name]  [id number] DOB: [birth year]  DOA: 11/25/2019 PCP: [name], FNP    Brief Narrative:     Medical records reviewed and are as summarized below:  Michael [name] is an 44 y.o. male with medical history significant for hypertension, polycystic kidney disease with advanced CKD, and gout, now presenting to the emergency department for evaluation of hypertension and worsening renal failure.  Patient was following with nephrology in the outpatient setting but his management has been complicated by serial no-shows.  Over recent weeks, he has developed progressive loss of appetite, change in his taste, occasional nausea, and increased fatigue.  He develops headaches at times which she attributes to blood pressure being high.  He has occasional tingling in his bilateral feet and sometimes fingertips but no focal weakness or change in vision.  He denies chest pain or leg swelling.  He has noticed some maroon blood mixed with stool occasionally in recent months and reports that this has been more frequent in the past 2 weeks over which interval he sometimes feels the need to defecate but only passes some maroon blood and clots.  He was seen by his PCP yesterday, blood work was obtained, and he was noted to have worsening in his GFR with increased potassium and decreased bicarbonate.    Assessment/Plan:   Principal Problem:   Renal failure Active Problems:   Hypertensive crisis   Adult polycystic kidney disease   Morbid obesity ([place])   Observed sleep apnea   Hyperkalemia   Normocytic anemia   Metabolic acidosis   Adjustment disorder with mixed anxiety and depressed mood   Acute kidney injury superimposed on CKD IV-V with polycystic kidney disease; hyperkalemia; acidosis; uremia   - Presents with worsening renal function and is found to have potassium 6.5, BUN 85, and bicarb 16  -follows with Dr. Hollie Salk at CKD since 2019, last visit 04/2019.  Was pursuing a  more naturopathic lifestyle at that time with juicing.  At that time his Cr was 4.2, K 6.  06/22/19 Labs with PCP showed K 6.4, BUN 58, Cr 5.6.  He missed several f/u with Dr. Hollie Salk since 04/2019 and had visit scheduled for next week.  - Nephrology consulting and much appreciated: Dr. Hollie Salk: I do not think he needs to start dialysis in the hospital.  I had a frank discussion with him today that he would probably need to start dialysis within the next month or two.  To do this, he needs an access and he needs to have regular followup with me.  He has an appointment with me 9/15 at 1:45 pm.  We discussed the importance of close followup. -difficult situation as patient reports different symptoms to different people: reported to me that he throws up 30-45 min after sitting up, not related to food  Depression/anxiety -under a large amount of stress: financial, health etc -reported suicidal thoughts- patient denied -psych consult appreciated  Hypertensive urgency  - BP as high as 213/115 in setting of worsening renal function and not taking medications for the past week  - He has had occasional HA and b/l feet tingling but not complaining of that now and denies any focal weakness or vision change, denies chest pain  - Resume hydralazine at a higher dose and Norvasc   Anemia; rectal bleeding  - resolved -will defer GI consult to outpatient -bowel regimen to avoid constipation  obesity Body mass index is 34.94 kg/m.  Hypocalcemia -phos  high -PTH high -started on calcitriol and calcium  OSA -CPAP QHS  Nausea and vomiting -not clearly related to food or position -says it happens 3-45 min after sitting up but not consistently today says thinks related to his AM meds  Constipation -good results with bowel regimen and enema   Family Communication/Anticipated D/C date and plan/Code Status   DVT prophylaxis: heparin Code Status: Full Code.  Disposition Plan: Status is: Inpatient  Remains  inpatient appropriate because:Inpatient level of care appropriate due to severity of illness   Dispo: The patient is from: Home              Anticipated d/c is to: Home              Anticipated d/c date is: in AM              Patient currently home in AM if able to keep down food         Medical Consultants:   Renal psych    Subjective:   Multiple complaints  Objective:    Vitals:   11/29/19 0709 11/29/19 0806 11/29/19 0823 11/29/19 1354  BP: (!) 149/81 (!) 135/57 140/80 (!) 159/82  Pulse:  96 91 100  Resp:   20 20  Temp:  98.2 F (36.8 C) 98.4 F (36.9 C) 98.1 F (36.7 C)  TempSrc:  Oral Oral Oral  SpO2:  97% 97% 94%  Weight:      Height:        Intake/Output Summary (Last 24 hours) at 11/29/2019 1412 Last data filed at 11/29/2019 1100 Gross per 24 hour  Intake 1320 ml  Output 1800 ml  Net -480 ml   Filed Weights   11/27/19 0524 11/28/19 0150 11/29/19 0229  Weight: 120 kg 120 kg 120.1 kg    Exam:   General: Appearance:    Obese male in no acute distress     Lungs:      respirations unlabored  Heart:    Normal rhythm. No murmurs, rubs, or gallops.   MS:   All extremities are intact.   Neurologic:   Awake, alert, oriented x 3. No apparent focal neurological           defect.      Data Reviewed:   I have personally reviewed following labs and imaging studies:  Labs: Labs show the following:   Basic Metabolic Panel: Recent Labs  Lab 11/25/19 1848 11/25/19 1848 11/25/19 2053 11/25/19 2053 11/25/19 2226 11/25/19 2226 11/26/19 0344 11/26/19 0344 11/27/19 0456 11/27/19 0456 11/28/19 0604 11/29/19 0546  NA 139  --   --   --   --   --  139  --  140  --  140 140  K 6.5*   < > 5.9*   < > 5.3*   < > 4.8   < > 4.7   < > 4.9 4.8  CL 111  --   --   --   --   --  111  --  108  --  107 107  CO2 16*  --   --   --   --   --  14*  --  18*  --  19* 20*  GLUCOSE 85  --   --   --   --   --  128*  --  86  --  90 98  BUN 85*  --   --   --   --   --  79*  --  76*  --  72* 71*  CREATININE 7.86*  --   --   --   --   --  7.48*  --  7.45*  --  7.84* 7.93*  CALCIUM 6.8*  --   --   --   --   --  6.7*  --  7.3*  --  7.7* 7.5*  MG  --   --  1.7  --   --   --   --   --   --   --   --   --   PHOS  --   --   --   --  5.9*  --   --   --   --   --   --   --    < > = values in this interval not displayed.   GFR Estimated Creatinine Clearance: 16.1 mL/min (A) (by C-G formula based on SCr of 7.93 mg/dL (H)). Liver Function Tests: Recent Labs  Lab 11/24/19 1027 11/25/19 1848  AST 12 19  ALT 17 19  ALKPHOS 56 46  BILITOT 0.3 0.5  PROT 7.3 7.5  ALBUMIN 4.1 3.7   No results for input(s): LIPASE, AMYLASE in the last 168 hours. Recent Labs  Lab 11/25/19 2128  AMMONIA 48*   Coagulation profile No results for input(s): INR, PROTIME in the last 168 hours.  CBC: Recent Labs  Lab 11/25/19 1848 11/26/19 0344 11/27/19 0456 11/28/19 0604 11/29/19 0546  WBC 9.9 10.0 9.7 9.7 8.6  HGB 10.4* 9.6* 9.6* 10.5* 10.1*  HCT 34.1* 31.3* 31.0* 33.8* 33.1*  MCV 86.3 88.7 84.9 85.6 85.8  PLT 272 248 258 262 247   Cardiac Enzymes: No results for input(s): CKTOTAL, CKMB, CKMBINDEX, TROPONINI in the last 168 hours. BNP (last 3 results) No results for input(s): PROBNP in the last 8760 hours. CBG: Recent Labs  Lab 11/25/19 1746 11/25/19 2132  GLUCAP 89 82   D-Dimer: No results for input(s): DDIMER in the last 72 hours. Hgb A1c: No results for input(s): HGBA1C in the last 72 hours. Lipid Profile: No results for input(s): CHOL, HDL, LDLCALC, TRIG, CHOLHDL, LDLDIRECT in the last 72 hours. Thyroid function studies: No results for input(s): TSH, T4TOTAL, T3FREE, THYROIDAB in the last 72 hours.  Invalid input(s): FREET3 Anemia work up: No results for input(s): VITAMINB12, FOLATE, FERRITIN, TIBC, IRON, RETICCTPCT in the last 72 hours. Sepsis Labs: Recent Labs  Lab 11/26/19 0344 11/27/19 0456 11/28/19 0604 11/29/19 0546  WBC 10.0 9.7 9.7 8.6     Microbiology Recent Results (from the past 240 hour(s))  SARS Coronavirus 2 by RT PCR (hospital order, performed in [facility] hospital lab) Nasopharyngeal Nasopharyngeal Swab     Status: None   Collection Time: 11/25/19  9:28 PM   Specimen: Nasopharyngeal Swab  Result Value Ref Range Status   SARS Coronavirus 2 NEGATIVE NEGATIVE Final    Comment: (NOTE) SARS-CoV-2 target nucleic acids are NOT DETECTED.  The SARS-CoV-2 RNA is generally detectable in upper and lower respiratory specimens during the acute phase of infection. The lowest concentration of SARS-CoV-2 viral copies this assay can detect is 250 copies / mL. A negative result does not preclude SARS-CoV-2 infection and should not be used as the sole basis for treatment or other patient management decisions.  A negative result may occur with improper specimen collection / handling, submission of specimen other than nasopharyngeal swab, presence of viral mutation(s) within the areas targeted by this assay, and  inadequate number of viral copies (<250 copies / mL). A negative result must be combined with clinical observations, patient history, and epidemiological information.  Fact Sheet for Patients:   StrictlyIdeas.no  Fact Sheet for Healthcare Providers: BankingDealers.co.za  This test is not yet approved or  cleared by the [place] FDA and has been authorized for detection and/or diagnosis of SARS-CoV-2 by FDA under an Emergency Use Authorization (EUA).  This EUA will remain in effect (meaning this test can be used) for the duration of the COVID-19 declaration under Section 564(b)(1) of the Act, 21 U.S.C. section 360bbb-3(b)(1), unless the authorization is terminated or revoked sooner.  Performed at [facility], [place] [address], [place], [place] [address]     Procedures and diagnostic studies:  DG Abd 1 View  Result Date: 11/27/2019 CLINICAL DATA:   Nausea EXAM: X-RAY ABDOMEN 1 VIEW COMPARISON:  03/28/2017 FINDINGS: The bowel gas pattern is normal. No radio-opaque calculi or other significant radiographic abnormality are seen. IMPRESSION: Negative. Electronically Signed   By: Donavan Foil M.D.   On: 11/27/2019 18:18    Medications:   . sodium chloride   Intravenous Once  . amLODipine  10 mg Oral Daily  . calcitRIOL  0.25 mcg Oral Daily  . calcium carbonate  1 tablet Oral TID  . diclofenac Sodium  2 g Topical QID  . docusate sodium  100 mg Oral Daily  . heparin injection (subcutaneous)  5,000 Units Subcutaneous Q8H  . hydrALAZINE  100 mg Oral Q8H  . pantoprazole  40 mg Oral BID AC  . sodium bicarbonate  650 mg Oral TID  . sodium chloride flush  3 mL Intravenous Q12H  . sodium chloride flush  3 mL Intravenous Q12H  . sodium zirconium cyclosilicate  10 g Oral BID   Continuous Infusions: . sodium chloride 250 mL (11/26/19 1228)  . ferric gluconate (FERRLECIT/NULECIT) IV 250 mg (11/26/19 1228)     LOS: 4 days   [place] [facility]   How to contact the [facility] Attending or Consulting provider [place] or covering provider during after hours [place], for this patient?  1. Check the care team in [facility] and look for a) attending/consulting TRH provider listed and b) the [facility] team listed 2. Log into [email or web address] and use [place]'s universal password to access. If you do not have the password, please contact the hospital operator. 3. Locate the [facility] provider you are looking for under [facility] and page to a number that you can be directly reached. 4. If you still have difficulty reaching the provider, please page the [facility] (Director on Call) for the Hospitalists listed on amion for assistance.  11/29/2019, 2:12 PM

## 2019-11-30 ENCOUNTER — Inpatient Hospital Stay (HOSPITAL_COMMUNITY): Payer: Medicaid Other

## 2019-11-30 DIAGNOSIS — R079 Chest pain, unspecified: Secondary | ICD-10-CM

## 2019-11-30 LAB — CBC
HCT: 36.6 % — ABNORMAL LOW (ref 39.0–52.0)
Hemoglobin: 11.4 g/dL — ABNORMAL LOW (ref 13.0–17.0)
MCH: 26.9 pg (ref 26.0–34.0)
MCHC: 31.1 g/dL (ref 30.0–36.0)
MCV: 86.3 fL (ref 80.0–100.0)
Platelets: 302 10*3/uL (ref 150–400)
RBC: 4.24 MIL/uL (ref 4.22–5.81)
RDW: 15.3 % (ref 11.5–15.5)
WBC: 9.7 10*3/uL (ref 4.0–10.5)
nRBC: 0 % (ref 0.0–0.2)

## 2019-11-30 LAB — BASIC METABOLIC PANEL
Anion gap: 15 (ref 5–15)
BUN: 69 mg/dL — ABNORMAL HIGH (ref 6–20)
CO2: 21 mmol/L — ABNORMAL LOW (ref 22–32)
Calcium: 7.8 mg/dL — ABNORMAL LOW (ref 8.9–10.3)
Chloride: 104 mmol/L (ref 98–111)
Creatinine, Ser: 8.12 mg/dL — ABNORMAL HIGH (ref 0.61–1.24)
GFR calc Af Amer: 8 mL/min — ABNORMAL LOW (ref >60)
GFR calc non Af Amer: 7 mL/min — ABNORMAL LOW (ref >60)
Glucose, Bld: 93 mg/dL (ref 70–99)
Potassium: 4.8 mmol/L (ref 3.5–5.1)
Sodium: 140 mmol/L (ref 135–145)

## 2019-11-30 LAB — ECHOCARDIOGRAM COMPLETE
AV Mean grad: 9 mmHg
AV Peak grad: 15.4 mmHg
Ao pk vel: 1.96 m/s
Area-P 1/2: 2.34 cm2
Height: 73 in
S' Lateral: 2.7 cm
Weight: 4196.8 oz

## 2019-11-30 LAB — TROPONIN I (HIGH SENSITIVITY)
Troponin I (High Sensitivity): 86 ng/L — ABNORMAL HIGH (ref <18)
Troponin I (High Sensitivity): 119 ng/L (ref <18)

## 2019-11-30 MED ORDER — METOCLOPRAMIDE HCL 5 MG/ML IJ SOLN
5.0000 mg | Freq: Four times a day (QID) | INTRAMUSCULAR | Status: DC | PRN
  Administered 2019-12-01 – 2019-12-02 (×3): 5 mg via INTRAVENOUS
  Filled 2019-11-30 (×3): qty 2

## 2019-11-30 MED ORDER — CALCITRIOL 0.25 MCG PO CAPS
0.2500 ug | ORAL_CAPSULE | Freq: Every day | ORAL | Status: DC
  Administered 2019-12-01 – 2019-12-02 (×2): 0.25 ug via ORAL
  Filled 2019-11-30 (×2): qty 1

## 2019-11-30 MED ORDER — CARVEDILOL 6.25 MG PO TABS
6.2500 mg | ORAL_TABLET | Freq: Two times a day (BID) | ORAL | Status: DC
  Administered 2019-11-30 – 2019-12-02 (×5): 6.25 mg via ORAL
  Filled 2019-11-30 (×5): qty 1

## 2019-11-30 NOTE — Progress Notes (Deleted)
Progress Note    Jubal Rademaker  [id number]:[id number] DOB: 04/23/1975  DOA: 11/25/2019 PCP: [name], FNP    Brief Narrative:     Medical records reviewed and are as summarized below:  Orlen Leedy is an 44 y.o. male with medical history significant for hypertension, polycystic kidney disease with advanced CKD, and gout, now presenting to the emergency department for evaluation of hypertension and worsening renal failure.  Patient was following with nephrology in the outpatient setting but his management has been complicated by serial no-shows.  Over recent weeks, he has developed progressive loss of appetite, change in his taste, occasional nausea, and increased fatigue.  He develops headaches at times which she attributes to blood pressure being high.  He has occasional tingling in his bilateral feet and sometimes fingertips but no focal weakness or change in vision.  He denies chest pain or leg swelling.  He has noticed some maroon blood mixed with stool occasionally in recent months and reports that this has been more frequent in the past 2 weeks over which interval he sometimes feels the need to defecate but only passes some maroon blood and clots.  He was seen by his PCP yesterday, blood work was obtained, and he was noted to have worsening in his GFR with increased potassium and decreased bicarbonate.    Assessment/Plan:   Principal Problem:   Renal failure Active Problems:   Hypertensive crisis   Adult polycystic kidney disease   Morbid obesity ([place])   Observed sleep apnea   Hyperkalemia   Normocytic anemia   Metabolic acidosis   Adjustment disorder with mixed anxiety and depressed mood   Acute kidney injury superimposed on CKD IV-V with polycystic kidney disease; hyperkalemia; acidosis; uremia   - Presents with worsening renal function and is found to have potassium 6.5, BUN 85, and bicarb 16  -follows with Dr. Hollie Salk at CKD since 2019, last visit 04/2019.  Was pursuing a  more naturopathic lifestyle at that time with juicing.  At that time his Cr was 4.2, K 6.  06/22/19 Labs with PCP showed K 6.4, BUN 58, Cr 5.6.  He missed several f/u with Dr. Hollie Salk since 04/2019 and had visit scheduled for next week.  - Nephrology consulting and much appreciated: Dr. Hollie Salk: I do not think he needs to start dialysis in the hospital.  I had a frank discussion with him today that he would probably need to start dialysis within the next month or two.  To do this, he needs an access and he needs to have regular followup with me.  He has an appointment with me 9/15 at 1:45 pm.  We discussed the importance of close followup. -difficult situation as patient reports different symptoms to different people: reported to me that he throws up 30-45 min after sitting up, not related to food  Depression/anxiety -under a large amount of stress: financial, health etc -reported suicidal thoughts- patient denied -psych consult appreciated  Hypertensive urgency  - BP as high as 213/115 in setting of worsening renal function and not taking medications for the past week  - He has had occasional HA and b/l feet tingling but not complaining of that now and denies any focal weakness or vision change, denies chest pain  - Resume hydralazine at a higher dose and Norvasc   Anemia; rectal bleeding  - resolved -will defer GI consult to outpatient -bowel regimen to avoid constipation  obesity Body mass index is 34.61 kg/m.  Hypocalcemia -phos  high -PTH high -started on calcitriol and calcium  OSA -CPAP QHS  Nausea and vomiting -not clearly related to food or position -says it happens 3-45 min after sitting up but not consistently today says thinks related to his AM meds  Constipation -good results with bowel regimen and enema   Family Communication/Anticipated D/C date and plan/Code Status   DVT prophylaxis: heparin Code Status: Full Code.  Disposition Plan: Status is: Inpatient  Remains  inpatient appropriate because:Inpatient level of care appropriate due to severity of illness   Dispo: The patient is from: Home              Anticipated d/c is to: Home              Anticipated d/c date is: in AM              Patient currently home in AM if able to keep down food         Medical Consultants:   Renal psych    Subjective:   Multiple complaints  Objective:    Vitals:   11/30/19 0538 11/30/19 0804 11/30/19 0815 11/30/19 1157  BP: (!) 176/99 102/82 (!) 159/87 (!) 142/89  Pulse: 86 (!) 103 98 (!) 102  Resp: 16     Temp: 98.4 F (36.9 C)   98.4 F (36.9 C)  TempSrc: Oral   Oral  SpO2: 97% 99% 99% 99%  Weight: 119 kg     Height:        Intake/Output Summary (Last 24 hours) at 11/30/2019 1351 Last data filed at 11/30/2019 0900 Gross per 24 hour  Intake 720 ml  Output 1200 ml  Net -480 ml   Filed Weights   11/28/19 0150 11/29/19 0229 11/30/19 0538  Weight: 120 kg 120.1 kg 119 kg    Exam:   General: Appearance:    Obese male in no acute distress     Lungs:      respirations unlabored  Heart:    Normal rhythm. No murmurs, rubs, or gallops.   MS:   All extremities are intact.   Neurologic:   Awake, alert, oriented x 3. No apparent focal neurological           defect.      Data Reviewed:   I have personally reviewed following labs and imaging studies:  Labs: Labs show the following:   Basic Metabolic Panel: Recent Labs  Lab 11/25/19 1848 11/25/19 2053 11/25/19 2053 11/25/19 2226 11/26/19 0344 11/26/19 0344 11/27/19 0456 11/27/19 0456 11/28/19 0604 11/28/19 0604 11/29/19 0546 11/30/19 0451  NA   < >  --   --   --  139  --  140  --  140  --  140 140  K   < > 5.9*   < > 5.3* 4.8   < > 4.7   < > 4.9   < > 4.8 4.8  CL   < >  --   --   --  111  --  108  --  107  --  107 104  CO2   < >  --   --   --  14*  --  18*  --  19*  --  20* 21*  GLUCOSE   < >  --   --   --  128*  --  86  --  90  --  98 93  BUN   < >  --   --   --  79*  --   76*  --  72*  --  71* 69*  CREATININE   < >  --   --   --  7.48*  --  7.45*  --  7.84*  --  7.93* 8.12*  CALCIUM   < >  --   --   --  6.7*  --  7.3*  --  7.7*  --  7.5* 7.8*  MG  --  1.7  --   --   --   --   --   --   --   --   --   --   PHOS  --   --   --  5.9*  --   --   --   --   --   --   --   --    < > = values in this interval not displayed.   GFR Estimated Creatinine Clearance: 15.7 mL/min (A) (by C-G formula based on SCr of 8.12 mg/dL (H)). Liver Function Tests: Recent Labs  Lab 11/24/19 1027 11/25/19 1848  AST 12 19  ALT 17 19  ALKPHOS 56 46  BILITOT 0.3 0.5  PROT 7.3 7.5  ALBUMIN 4.1 3.7   No results for input(s): LIPASE, AMYLASE in the last 168 hours. Recent Labs  Lab 11/25/19 2128  AMMONIA 48*   Coagulation profile No results for input(s): INR, PROTIME in the last 168 hours.  CBC: Recent Labs  Lab 11/26/19 0344 11/27/19 0456 11/28/19 0604 11/29/19 0546 11/30/19 0451  WBC 10.0 9.7 9.7 8.6 9.7  HGB 9.6* 9.6* 10.5* 10.1* 11.4*  HCT 31.3* 31.0* 33.8* 33.1* 36.6*  MCV 88.7 84.9 85.6 85.8 86.3  PLT 248 258 262 247 302   Cardiac Enzymes: No results for input(s): CKTOTAL, CKMB, CKMBINDEX, TROPONINI in the last 168 hours. BNP (last 3 results) No results for input(s): PROBNP in the last 8760 hours. CBG: Recent Labs  Lab 11/25/19 1746 11/25/19 2132  GLUCAP 89 82   D-Dimer: No results for input(s): DDIMER in the last 72 hours. Hgb A1c: No results for input(s): HGBA1C in the last 72 hours. Lipid Profile: No results for input(s): CHOL, HDL, LDLCALC, TRIG, CHOLHDL, LDLDIRECT in the last 72 hours. Thyroid function studies: No results for input(s): TSH, T4TOTAL, T3FREE, THYROIDAB in the last 72 hours.  Invalid input(s): FREET3 Anemia work up: No results for input(s): VITAMINB12, FOLATE, FERRITIN, TIBC, IRON, RETICCTPCT in the last 72 hours. Sepsis Labs: Recent Labs  Lab 11/27/19 0456 11/28/19 0604 11/29/19 0546 11/30/19 0451  WBC 9.7 9.7 8.6 9.7     Microbiology Recent Results (from the past 240 hour(s))  SARS Coronavirus 2 by RT PCR (hospital order, performed in [facility] hospital lab) Nasopharyngeal Nasopharyngeal Swab     Status: None   Collection Time: 11/25/19  9:28 PM   Specimen: Nasopharyngeal Swab  Result Value Ref Range Status   SARS Coronavirus 2 NEGATIVE NEGATIVE Final    Comment: (NOTE) SARS-CoV-2 target nucleic acids are NOT DETECTED.  The SARS-CoV-2 RNA is generally detectable in upper and lower respiratory specimens during the acute phase of infection. The lowest concentration of SARS-CoV-2 viral copies this assay can detect is 250 copies / mL. A negative result does not preclude SARS-CoV-2 infection and should not be used as the sole basis for treatment or other patient management decisions.  A negative result may occur with improper specimen collection / handling, submission of specimen other than nasopharyngeal swab, presence of viral mutation(s) within  the areas targeted by this assay, and inadequate number of viral copies (<250 copies / mL). A negative result must be combined with clinical observations, patient history, and epidemiological information.  Fact Sheet for Patients:   StrictlyIdeas.no  Fact Sheet for Healthcare Providers: BankingDealers.co.za  This test is not yet approved or  cleared by the [place] FDA and has been authorized for detection and/or diagnosis of SARS-CoV-2 by FDA under an Emergency Use Authorization (EUA).  This EUA will remain in effect (meaning this test can be used) for the duration of the COVID-19 declaration under Section 564(b)(1) of the Act, 21 U.S.C. section 360bbb-3(b)(1), unless the authorization is terminated or revoked sooner.  Performed at [facility], [place] [address], [place], [place] [address]     Procedures and diagnostic studies:  No results found.  Medications:   . sodium chloride    Intravenous Once  . amLODipine  10 mg Oral Daily  . [START ON 12/01/2019] calcitRIOL  0.25 mcg Oral Daily  . calcium carbonate  1 tablet Oral TID  . carvedilol  6.25 mg Oral BID WC  . diclofenac Sodium  2 g Topical QID  . docusate sodium  100 mg Oral Daily  . heparin injection (subcutaneous)  5,000 Units Subcutaneous Q8H  . hydrALAZINE  100 mg Oral Q8H  . pantoprazole  40 mg Oral BID AC  . sodium bicarbonate  650 mg Oral TID  . sodium chloride flush  3 mL Intravenous Q12H  . sodium chloride flush  3 mL Intravenous Q12H  . sodium zirconium cyclosilicate  10 g Oral BID   Continuous Infusions: . sodium chloride 250 mL (11/26/19 1228)  . ferric gluconate (FERRLECIT/NULECIT) IV 250 mg (11/26/19 1228)     LOS: 5 days   Geradine Girt  [facility]   How to contact the [facility] Attending or Consulting provider [place] or covering provider during after hours [place], for this patient?  1. Check the care team in [facility] and look for a) attending/consulting TRH provider listed and b) the [facility] team listed 2. Log into [email or web address] and use [place]'s universal password to access. If you do not have the password, please contact the hospital operator. 3. Locate the [facility] provider you are looking for under [facility] and page to a number that you can be directly reached. 4. If you still have difficulty reaching the provider, please page the [facility] (Director on Call) for the Hospitalists listed on amion for assistance.  11/30/2019, 1:51 PM

## 2019-11-30 NOTE — Progress Notes (Signed)
Progress Note    Michael [name]  OEU:[id number] DOB: [birth year]  DOA: 11/25/2019 PCP: [name], FNP    Brief Narrative:     Medical records reviewed and are as summarized below:  Michael [name] is an 44 y.o. male with medical history significant for hypertension, polycystic kidney disease with advanced CKD, and gout, now presenting to the emergency department for evaluation of hypertension and worsening renal failure.  Patient was following with nephrology in the outpatient setting but his management has been complicated by serial no-shows.  Over recent weeks, he has developed progressive loss of appetite, change in his taste, occasional nausea, and increased fatigue.  He develops headaches at times which she attributes to blood pressure being high.  He has occasional tingling in his bilateral feet and sometimes fingertips but no focal weakness or change in vision.  He denies chest pain or leg swelling.  He has noticed some maroon blood mixed with stool occasionally in recent months and reports that this has been more frequent in the past 2 weeks over which interval he sometimes feels the need to defecate but only passes some maroon blood and clots.  He was seen by his PCP yesterday, blood work was obtained, and he was noted to have worsening in his GFR with increased potassium and decreased bicarbonate.    Assessment/Plan:   Principal Problem:   Renal failure Active Problems:   Hypertensive crisis   Adult polycystic kidney disease   Morbid obesity ([place])   Observed sleep apnea   Hyperkalemia   Normocytic anemia   Metabolic acidosis   Adjustment disorder with mixed anxiety and depressed mood   Chest pain -new this AM -cycle CE -echo -symptoms not consistent with ACS or PE -? Anxiety  Acute kidney injury superimposed on CKD IV-V with polycystic kidney disease; hyperkalemia; acidosis; uremia   - Presents with worsening renal function and is found to have potassium 6.5, BUN  85, and bicarb 16  -follows with Dr. Hollie Salk at CKD since 2019, last visit 04/2019.  Was pursuing a more naturopathic lifestyle at that time with juicing.  At that time his Cr was 4.2, K 6.  06/22/19 Labs with PCP showed K 6.4, BUN 58, Cr 5.6.  He missed several f/u with Dr. Hollie Salk since 04/2019 and had visit scheduled for next week.  - Nephrology consulting and much appreciated: Dr. Hollie Salk: I do not think he needs to start dialysis in the hospital.  I had a frank discussion with him today that he would probably need to start dialysis within the next month or two.  To do this, he needs an access and he needs to have regular followup with me.  He has an appointment with me 9/15 at 1:45 pm.  We discussed the importance of close followup.  Depression/anxiety -under a large amount of stress: financial, health etc -reported suicidal thoughts- patient denied -psych consult appreciated  Hypertensive urgency  - BP as high as 213/115 in setting of worsening renal function and not taking medications for the past week  - He has had occasional HA and b/l feet tingling but not complaining of that now and denies any focal weakness or vision change, denies chest pain  - Resume hydralazine at a higher dose and Norvasc   Anemia; rectal bleeding  - resolved -will defer GI consult to outpatient -bowel regimen to avoid constipation  obesity Body mass index is 34.61 kg/m.  Hypocalcemia -phos high -PTH high -started on calcitriol and calcium  OSA -CPAP QHS  Nausea and vomiting -not clearly related to food or position -says it happens 3-45 min after sitting up but not consistently today says thinks related to his AM meds  Constipation -good results with bowel regimen and enema   Family Communication/Anticipated D/C date and plan/Code Status   DVT prophylaxis: heparin Code Status: Full Code.  Disposition Plan: Status is: Inpatient  Remains inpatient appropriate because:Inpatient level of care  appropriate due to severity of illness   Dispo: The patient is from: Home              Anticipated d/c is to: Home              Anticipated d/c date is: ?                       Medical Consultants:   Renal psych    Subjective:   Vomiting today has stopped but now c/o chest pain with pain/numbness down left arm  Objective:    Vitals:   11/30/19 0538 11/30/19 0804 11/30/19 0815 11/30/19 1157  BP: (!) 176/99 102/82 (!) 159/87 (!) 142/89  Pulse: 86 (!) 103 98 (!) 102  Resp: 16     Temp: 98.4 F (36.9 C)   98.4 F (36.9 C)  TempSrc: Oral   Oral  SpO2: 97% 99% 99% 99%  Weight: 119 kg     Height:        Intake/Output Summary (Last 24 hours) at 11/30/2019 1504 Last data filed at 11/30/2019 1300 Gross per 24 hour  Intake 960 ml  Output 1500 ml  Net -540 ml   Filed Weights   11/28/19 0150 11/29/19 0229 11/30/19 0538  Weight: 120 kg 120.1 kg 119 kg    Exam:    General: Appearance:    Obese male anxious appearing     Lungs:      respirations unlabored  Heart:    Tachycardic.   MS:   All extremities are intact.   Neurologic:   Awake, alert, oriented x 3. No apparent focal neurological           defect.     Data Reviewed:   I have personally reviewed following labs and imaging studies:  Labs: Labs show the following:   Basic Metabolic Panel: Recent Labs  Lab 11/25/19 1848 11/25/19 2053 11/25/19 2053 11/25/19 2226 11/26/19 0344 11/26/19 0344 11/27/19 0456 11/27/19 0456 11/28/19 0604 11/28/19 0604 11/29/19 0546 11/30/19 0451  NA   < >  --   --   --  139  --  140  --  140  --  140 140  K   < > 5.9*   < > 5.3* 4.8   < > 4.7   < > 4.9   < > 4.8 4.8  CL   < >  --   --   --  111  --  108  --  107  --  107 104  CO2   < >  --   --   --  14*  --  18*  --  19*  --  20* 21*  GLUCOSE   < >  --   --   --  128*  --  86  --  90  --  98 93  BUN   < >  --   --   --  79*  --  76*  --  72*  --  71* 69*  CREATININE   < >  --   --   --  7.48*  --  7.45*  --   7.84*  --  7.93* 8.12*  CALCIUM   < >  --   --   --  6.7*  --  7.3*  --  7.7*  --  7.5* 7.8*  MG  --  1.7  --   --   --   --   --   --   --   --   --   --   PHOS  --   --   --  5.9*  --   --   --   --   --   --   --   --    < > = values in this interval not displayed.   GFR Estimated Creatinine Clearance: 15.7 mL/min (A) (by C-G formula based on SCr of 8.12 mg/dL (H)). Liver Function Tests: Recent Labs  Lab 11/24/19 1027 11/25/19 1848  AST 12 19  ALT 17 19  ALKPHOS 56 46  BILITOT 0.3 0.5  PROT 7.3 7.5  ALBUMIN 4.1 3.7   No results for input(s): LIPASE, AMYLASE in the last 168 hours. Recent Labs  Lab 11/25/19 2128  AMMONIA 48*   Coagulation profile No results for input(s): INR, PROTIME in the last 168 hours.  CBC: Recent Labs  Lab 11/26/19 0344 11/27/19 0456 11/28/19 0604 11/29/19 0546 11/30/19 0451  WBC 10.0 9.7 9.7 8.6 9.7  HGB 9.6* 9.6* 10.5* 10.1* 11.4*  HCT 31.3* 31.0* 33.8* 33.1* 36.6*  MCV 88.7 84.9 85.6 85.8 86.3  PLT 248 258 262 247 302   Cardiac Enzymes: No results for input(s): CKTOTAL, CKMB, CKMBINDEX, TROPONINI in the last 168 hours. BNP (last 3 results) No results for input(s): PROBNP in the last 8760 hours. CBG: Recent Labs  Lab 11/25/19 1746 11/25/19 2132  GLUCAP 89 82   D-Dimer: No results for input(s): DDIMER in the last 72 hours. Hgb A1c: No results for input(s): HGBA1C in the last 72 hours. Lipid Profile: No results for input(s): CHOL, HDL, LDLCALC, TRIG, CHOLHDL, LDLDIRECT in the last 72 hours. Thyroid function studies: No results for input(s): TSH, T4TOTAL, T3FREE, THYROIDAB in the last 72 hours.  Invalid input(s): FREET3 Anemia work up: No results for input(s): VITAMINB12, FOLATE, FERRITIN, TIBC, IRON, RETICCTPCT in the last 72 hours. Sepsis Labs: Recent Labs  Lab 11/27/19 0456 11/28/19 0604 11/29/19 0546 11/30/19 0451  WBC 9.7 9.7 8.6 9.7    Microbiology Recent Results (from the past 240 hour(s))  SARS Coronavirus 2  by RT PCR (hospital order, performed in [facility] hospital lab) Nasopharyngeal Nasopharyngeal Swab     Status: None   Collection Time: 11/25/19  9:28 PM   Specimen: Nasopharyngeal Swab  Result Value Ref Range Status   SARS Coronavirus 2 NEGATIVE NEGATIVE Final    Comment: (NOTE) SARS-CoV-2 target nucleic acids are NOT DETECTED.  The SARS-CoV-2 RNA is generally detectable in upper and lower respiratory specimens during the acute phase of infection. The lowest concentration of SARS-CoV-2 viral copies this assay can detect is 250 copies / mL. A negative result does not preclude SARS-CoV-2 infection and should not be used as the sole basis for treatment or other patient management decisions.  A negative result may occur with improper specimen collection / handling, submission of specimen other than nasopharyngeal swab, presence of viral mutation(s) within the areas targeted by this assay, and inadequate number of viral copies (<250 copies /  mL). A negative result must be combined with clinical observations, patient history, and epidemiological information.  Fact Sheet for Patients:   StrictlyIdeas.no  Fact Sheet for Healthcare Providers: BankingDealers.co.za  This test is not yet approved or  cleared by the [place] FDA and has been authorized for detection and/or diagnosis of SARS-CoV-2 by FDA under an Emergency Use Authorization (EUA).  This EUA will remain in effect (meaning this test can be used) for the duration of the COVID-19 declaration under Section 564(b)(1) of the Act, 21 U.S.C. section 360bbb-3(b)(1), unless the authorization is terminated or revoked sooner.  Performed at [facility], [place] [address], [place], [place] [address]     Procedures and diagnostic studies:  No results found.  Medications:   . sodium chloride   Intravenous Once  . amLODipine  10 mg Oral Daily  . [START ON 12/01/2019] calcitRIOL   0.25 mcg Oral Daily  . calcium carbonate  1 tablet Oral TID  . carvedilol  6.25 mg Oral BID WC  . diclofenac Sodium  2 g Topical QID  . docusate sodium  100 mg Oral Daily  . heparin injection (subcutaneous)  5,000 Units Subcutaneous Q8H  . hydrALAZINE  100 mg Oral Q8H  . pantoprazole  40 mg Oral BID AC  . sodium bicarbonate  650 mg Oral TID  . sodium chloride flush  3 mL Intravenous Q12H  . sodium chloride flush  3 mL Intravenous Q12H  . sodium zirconium cyclosilicate  10 g Oral BID   Continuous Infusions: . sodium chloride 250 mL (11/26/19 1228)  . ferric gluconate (FERRLECIT/NULECIT) IV 250 mg (11/26/19 1228)     LOS: 5 days   Geradine Girt  [facility]   How to contact the [facility] Attending or Consulting provider [place] or covering provider during after hours [place], for this patient?  1. Check the care team in [facility] and look for a) attending/consulting [facility] provider listed and b) the [facility] team listed 2. Log into [email or web address] and use [place]'s universal password to access. If you do not have the password, please contact the hospital operator. 3. Locate the [facility] provider you are looking for under [facility] and page to a number that you can be directly reached. 4. If you still have difficulty reaching the provider, please page the [facility] (Director on Call) for the Hospitalists listed on amion for assistance.  11/30/2019, 3:04 PM

## 2019-11-30 NOTE — Plan of Care (Signed)
  Problem: Education: Goal: Knowledge of General Education information will improve Description: Including pain rating scale, medication(s)/side effects and non-pharmacologic comfort measures Outcome: Progressing   Problem: Education: Goal: Knowledge of General Education information will improve Description: Including pain rating scale, medication(s)/side effects and non-pharmacologic comfort measures Outcome: Progressing   Problem: Clinical Measurements: Goal: Will remain free from infection Outcome: Progressing   Problem: Clinical Measurements: Goal: Respiratory complications will improve Outcome: Progressing   Problem: Elimination: Goal: Will not experience complications related to bowel motility Outcome: Progressing   Problem: Pain Managment: Goal: General experience of comfort will improve Outcome: Progressing   Problem: Safety: Goal: Ability to remain free from injury will improve Outcome: Progressing   Problem: Skin Integrity: Goal: Risk for impaired skin integrity will decrease Outcome: Progressing

## 2019-11-30 NOTE — Progress Notes (Signed)
  Echocardiogram 2D Echocardiogram has been performed.  Michael [name] 11/30/2019, 3:12 PM

## 2019-11-30 NOTE — Progress Notes (Signed)
   11/30/19 1400  Clinical Encounter Type  Visited With Patient  Visit Type Initial  Referral From Nurse  Consult/Referral To Chaplain  Spiritual Encounters  Spiritual Needs Emotional  Chaplain responded to spiritual consult. Chaplain listened to patient and offered emotional support. Chaplain offered prayer and patient declined. Chaplain will follow up as needed.

## 2019-12-01 ENCOUNTER — Ambulatory Visit: Payer: Self-pay

## 2019-12-01 ENCOUNTER — Inpatient Hospital Stay (HOSPITAL_COMMUNITY): Payer: Medicaid Other

## 2019-12-01 ENCOUNTER — Ambulatory Visit: Payer: Self-pay | Admitting: Family Medicine

## 2019-12-01 LAB — TROPONIN I (HIGH SENSITIVITY)
Troponin I (High Sensitivity): 87 ng/L — ABNORMAL HIGH (ref <18)
Troponin I (High Sensitivity): 75 ng/L — ABNORMAL HIGH (ref <18)

## 2019-12-01 MED ORDER — TECHNETIUM TC 99M TETROFOSMIN IV KIT
10.0000 | PACK | Freq: Once | INTRAVENOUS | Status: AC | PRN
  Administered 2019-12-01: 10 via INTRAVENOUS

## 2019-12-01 MED ORDER — DOCUSATE SODIUM 100 MG PO CAPS
100.0000 mg | ORAL_CAPSULE | Freq: Two times a day (BID) | ORAL | Status: DC
  Administered 2019-12-01 – 2019-12-02 (×3): 100 mg via ORAL
  Filled 2019-12-01 (×3): qty 1

## 2019-12-01 NOTE — Progress Notes (Signed)
Patient became nauseous with vomiting and lightheaded why moving hard stool. Made MD aware, gave PRNs and patient is comfortable and sleeping. Orders given to make NPO for Stress test and an Enema to relieve constipation.

## 2019-12-01 NOTE — Plan of Care (Signed)
  Problem: Education: Goal: Knowledge of General Education information will improve Description: Including pain rating scale, medication(s)/side effects and non-pharmacologic comfort measures Outcome: Progressing Note: Patient gave teach back on understanding how blood pressure affects the kidney function   Problem: Clinical Measurements: Goal: Diagnostic test results will improve Outcome: Progressing Note: Troponin levels have decreased  Goal: Cardiovascular complication will be avoided Outcome: Progressing   Problem: Nutrition: Goal: Adequate nutrition will be maintained Outcome: Progressing   Problem: Coping: Goal: Level of anxiety will decrease Outcome: Progressing   Problem: Elimination: Goal: Will not experience complications related to bowel motility Outcome: Progressing   Problem: Safety: Goal: Ability to remain free from injury will improve Outcome: Progressing   Problem: Skin Integrity: Goal: Risk for impaired skin integrity will decrease Outcome: Progressing

## 2019-12-01 NOTE — Consult Note (Signed)
CARDIOLOGY CONSULT NOTE  Patient ID: Michael [name] MRN: [id number] DOB/AGE: July 01, 1975 44 y.o.  Admit date: 11/25/2019 Attending physician: Geradine Girt, DO Primary Physician:  [name], FNP Outpatient Cardiologist: NA Inpatient Cardiologist: Rex Kras, DO, [facility]  Chief complaint: Sleepy, blood in stool, malaise Reason of consultation: Chest pain and elevated cardiac biomarkers  HPI:  Michael [name] is a 44 y.o. African-American male who presents with a chief complaint of " sleepy, blood in the stool, malaise." His past medical history and cardiovascular risk factors include: Hypertension, polycystic kidney disease with advanced chronic kidney disease, gout, noncompliance with follow-up as outpatient, former smoker, OSA on CPAP, obesity due to excess calories.  It appears the patient has been experiencing loss of appetite, change in taste, occasional feeling nauseated, and increased fatigue.  With his bowel movements he is also passing maroon stools and blood clots according to him.  He had gone to see his primary care physician and blood work had noted worsening kidney function, increase potassium and decreased bicarbonate level and patient was referred to the hospital for further evaluation and management.  On presentation patient's blood pressure was 213/115 and EKG showed sinus rhythm, LVH per voltage criteria and ST-T changes most likely secondary to LVH.  He did not have any chest pain on presentation.  His creatinine on admission was 7.86 mg/dL as well as anemia.  He has been seen by the hospitalist team as well as nephrology during this admission.  During the hospitalization patient noted symptoms of chest discomfort and cardiac biomarkers were obtained and serial EKGs as well.  Cardiac biomarkers were elevated and therefore cardiology was consulted for further recommendations.  Patient states that he started having chest pain on Saturday, November 28, 2019.  The symptoms are  sporadic, the pain is located substernally, not always brought on by activity but at times it can be, last for about 15 minutes, usually self-limited.  His last 2 high sensitive troponin levels were elevated 86 followed by 119.  Serum creatinine 8.12 mg/dL no prior troponin levels for comparison available.  EKGs reviewed and noted below for further reference.  Echocardiogram performed yesterday notes preserved left ventricular systolic function, severe LVH concerning for infiltrative cardiomyopathy, pericardial effusion circumferential in the right ventricle, trivial MR, RVSP 34 mmHg.  Recommend work-up with either cardiac MRI or PYP scan.  No family history of premature coronary artery disease.   ALLERGIES: No Known Allergies  PAST MEDICAL HISTORY: Past Medical History:  Diagnosis Date  . CKD (chronic kidney disease)   . Hypertension   . OSA (obstructive sleep apnea)   . Polycystic kidney disease   . Vitamin D deficiency 11/2018    PAST SURGICAL HISTORY: History reviewed. No pertinent surgical history.  No known surgical history.  FAMILY HISTORY: The patient family history includes Bipolar disorder in his father.  Denies premature coronary artery disease or sudden cardiac death in the family.   SOCIAL HISTORY:  The patient  reports that he has quit smoking. He has never used smokeless tobacco. He reports current alcohol use. He reports that he does not use drugs.  Did use edible marijuana approximately 1 month ago.  MEDICATIONS: Current Outpatient Medications  Medication Instructions  . allopurinol (ZYLOPRIM) 100 mg, Oral, 2 times daily  . amLODipine (NORVASC) 10 mg, Oral, Daily  . APPLE CIDER VINEGAR PO 1 tablet, Oral, Daily, [place]oli Gummie  . atenolol (TENORMIN) 25 mg, Oral, Daily  . calcitRIOL (ROCALTROL) 0.25 mcg, Oral, Daily  . hydrALAZINE (APRESOLINE)  50 mg, Oral, Every 8 hours  . hydrALAZINE (APRESOLINE) 100 mg, Oral, Every 8 hours  . hydrocortisone 1 % ointment 1  application, Topical, 2 times daily  . indomethacin (INDOCIN) 25 mg, Oral, 2 times daily with meals  . losartan (COZAAR) 50 mg, Oral, Daily  . multivitamin (ONE-A-DAY MEN'S) TABS tablet 1 tablet, Oral, Daily  . sodium bicarbonate 650 mg, Oral, 3 times daily  . sodium zirconium cyclosilicate (LOKELMA) 10 g, Oral, 2 times daily    14 ORGAN REVIEW OF SYSTEMS: Review of Systems  Constitutional: Positive for decreased appetite and malaise/fatigue. Negative for chills and fever.  HENT: Negative for hoarse voice and nosebleeds.   Eyes: Negative for discharge, double vision and pain.  Cardiovascular: Positive for chest pain. Negative for claudication, dyspnea on exertion, leg swelling, near-syncope, orthopnea, palpitations, paroxysmal nocturnal dyspnea and syncope.  Respiratory: Negative for hemoptysis and shortness of breath.   Musculoskeletal: Negative for muscle cramps and myalgias.  Gastrointestinal: Positive for nausea. Negative for abdominal pain, constipation, diarrhea and vomiting.  Neurological: Positive for headaches. Negative for dizziness and light-headedness.  All other systems reviewed and are negative.   PHYSICAL EXAM: Vitals with BMI 12/01/2019 12/01/2019 12/01/2019  Height - - -  Weight - - 263 lbs 2 oz  BMI - - 38.75  Systolic 643 329 -  Diastolic 78 85 -  Pulse 90 85 -     Intake/Output Summary (Last 24 hours) at 12/01/2019 0931 Last data filed at 12/01/2019 0847 Gross per 24 hour  Intake 1220 ml  Output 1900 ml  Net -680 ml    Net IO Since Admission: -1,518.32 mL [12/01/19 0931]  CONSTITUTIONAL: Well-developed and well-nourished. No acute distress.  SKIN: Skin is warm and dry. No rash noted. No cyanosis. No pallor. No jaundice HEAD: Normocephalic and atraumatic.  EYES: No scleral icterus MOUTH/THROAT: Moist oral membranes.  NECK: No JVD present. No thyromegaly noted. No carotid bruits  LYMPHATIC: No visible cervical adenopathy.  CHEST Normal respiratory effort.  No intercostal retractions  LUNGS: Clear to auscultation bilaterally.  No stridor. No wheezes. No rales.  CARDIOVASCULAR: Regular rate and rhythm, positive S1-S2, no murmurs rubs or gallops appreciated ABDOMINAL: No apparent ascites.  EXTREMITIES: No peripheral edema, 2+ dorsalis pedis and posterior tibial pulses bilaterally. HEMATOLOGIC: No significant bruising NEUROLOGIC: Oriented to person, place, and time. Nonfocal. Normal muscle tone.  PSYCHIATRIC: Normal mood and affect. Normal behavior. Cooperative  RADIOLOGY: ECHOCARDIOGRAM COMPLETE  Result Date: 11/30/2019    ECHOCARDIOGRAM REPORT   Patient Name:   Michael [name] Date of Exam: 11/30/2019 Medical Rec #:  [id number]    Height:       73.0 in Accession #:    [id number]   Weight:       262.3 lb Date of Birth:  [birth year]     BSA:          2.414 m Patient Age:    35 years     BP:           142/85 mmHg Patient Gender: M            HR:           92 bpm. Exam Location:  Inpatient Procedure: 2D Echo, Cardiac Doppler and Color Doppler Indications:    Chest pain  History:        Patient has no prior history of Echocardiogram examinations.                 Risk Factors:Former Smoker, Sleep  Apnea and Hypertension. CKD.                 Polycystic kidney disease.  Sonographer:    Clayton Lefort RDCS (AE) Referring Phys: [id number] JESSICA Alison Stalling  Sonographer Comments: Global longitudinal strain was attempted. IMPRESSIONS  1. Left ventricular ejection fraction, by estimation, is 60 to 65%. The left ventricle has normal function. The left ventricle has no regional wall motion abnormalities. There is severe left ventricular wall thickening. Wall thickness is 2.5 cm average,  concerning for infiltrative cardiomyopathy such as amyloidosis. Impaired diastolic function with indeterminate LV filling pressure.  2. The aortic valve is tricuspid. Aortic valve regurgitation is not visualized.  3. The pericardial effusion is localized near the right ventricle and circumferential.  4. The  inferior vena cava is dilated in size with >50% respiratory variability, suggesting right atrial pressure of 8 mmHg.  5. Right ventricular systolic function is normal. The right ventricular size is normal. There is normal pulmonary artery systolic pressure. The estimated right ventricular systolic pressure is 85.2 mmHg.  6. The mitral valve is grossly normal. Trivial mitral valve regurgitation. Conclusion(s)/Recommendation(s): Consider further work-up for infiltrative cardiomyopathy or less likely HCM with cMRI or PYP scan. FINDINGS  Left Ventricle: Left ventricular ejection fraction, by estimation, is 60 to 65%. The left ventricle has normal function. The left ventricle has no regional wall motion abnormalities. The left ventricular internal cavity size was small. There is severe left ventricular hypertrophy. Left ventricular diastolic parameters are consistent with Grade I diastolic dysfunction (impaired relaxation). Indeterminate filling pressures. Right Ventricle: The right ventricular size is normal. No increase in right ventricular wall thickness. Right ventricular systolic function is normal. There is normal pulmonary artery systolic pressure. The tricuspid regurgitant velocity is 2.55 m/s, and  with an assumed right atrial pressure of 8 mmHg, the estimated right ventricular systolic pressure is 77.8 mmHg. Left Atrium: Left atrial size was normal in size. Right Atrium: Right atrial size was normal in size. Pericardium: Trivial pericardial effusion is present. The pericardial effusion is localized near the right ventricle and circumferential. Mitral Valve: The mitral valve is grossly normal. Trivial mitral valve regurgitation. MV peak gradient, 3.0 mmHg. The mean mitral valve gradient is 1.0 mmHg. Tricuspid Valve: The tricuspid valve is grossly normal. Tricuspid valve regurgitation is trivial. Aortic Valve: The aortic valve is tricuspid. Aortic valve regurgitation is not visualized. Aortic valve mean gradient  measures 9.0 mmHg. Aortic valve peak gradient measures 15.4 mmHg. Pulmonic Valve: The pulmonic valve was grossly normal. Pulmonic valve regurgitation is trivial. Aorta: The aortic root and ascending aorta are structurally normal, with no evidence of dilitation. Venous: The inferior vena cava is dilated in size with greater than 50% respiratory variability, suggesting right atrial pressure of 8 mmHg. IAS/Shunts: No atrial level shunt detected by color flow Doppler.  LEFT VENTRICLE PLAX 2D LVIDd:         4.10 cm LVIDs:         2.70 cm LV PW:         2.60 cm LV IVS:        2.40 cm LVOT diam:     2.30 cm LVOT Area:     4.15 cm  RIGHT VENTRICLE          IVC RV Basal diam:  3.10 cm  IVC diam: 2.20 cm TAPSE (M-mode): 2.3 cm LEFT ATRIUM           Index       RIGHT ATRIUM  Index LA diam:      3.20 cm 1.33 cm/m  RA Area:     12.80 cm LA Vol (A4C): 52.0 ml 21.54 ml/m RA Volume:   26.00 ml  10.77 ml/m  AORTIC VALVE AV Vmax:      196.00 cm/s AV Vmean:     135.000 cm/s AV VTI:       0.312 m AV Peak Grad: 15.4 mmHg AV Mean Grad: 9.0 mmHg  AORTA Ao Root diam: 3.60 cm Ao Asc diam:  3.10 cm MITRAL VALVE               TRICUSPID VALVE MV Area (PHT): 2.34 cm    TR Peak grad:   26.0 mmHg MV Peak grad:  3.0 mmHg    TR Vmax:        255.00 cm/s MV Mean grad:  1.0 mmHg MV Vmax:       0.87 m/s    SHUNTS MV Vmean:      55.1 cm/s   Systemic Diam: 2.30 cm MV Decel Time: 324 msec MV E velocity: 67.90 cm/s MV A velocity: 56.20 cm/s MV E/A ratio:  1.21 Michael Bishop MD Electronically signed by Michael Bishop MD Signature Date/Time: 11/30/2019/4:11:47 PM    Final     LABORATORY DATA: Lab Results  Component Value Date   WBC 9.7 11/30/2019   HGB 11.4 (L) 11/30/2019   HCT 36.6 (L) 11/30/2019   MCV 86.3 11/30/2019   PLT 302 11/30/2019    Recent Labs  Lab 11/25/19 1848 11/25/19 2053 11/30/19 0451  NA 139   < > 140  K 6.5*   < > 4.8  CL 111   < > 104  CO2 16*   < > 21*  BUN 85*   < > 69*  CREATININE 7.86*   < > 8.12*    CALCIUM 6.8*   < > 7.8*  PROT 7.5  --   --   BILITOT 0.5  --   --   ALKPHOS 46  --   --   ALT 19  --   --   AST 19  --   --   GLUCOSE 85   < > 93   < > = values in this interval not displayed.    Lipid Panel     Component Value Date/Time   CHOL 221 (H) 12/12/2018 1038   TRIG 263 (H) 12/12/2018 1038   HDL 35 (L) 12/12/2018 1038   CHOLHDL 6.3 (H) 12/12/2018 1038   CHOLHDL 7.1 12/06/2009 0635   VLDL 18 12/06/2009 0635   LDLCALC 138 (H) 12/12/2018 1038    BNP (last 3 results) No results for input(s): BNP in the last 8760 hours.  HEMOGLOBIN A1C Lab Results  Component Value Date   HGBA1C 5.6 11/24/2019   HGBA1C 5.6 11/24/2019   HGBA1C 5.6 (A) 11/24/2019   HGBA1C 5.6 11/24/2019    Cardiac Panel (last 3 results) No results for input(s): CKTOTAL, CKMB, RELINDX in the last 8760 hours.  Invalid input(s): TROPONINHS  No results found for: CKTOTAL, CKMB, CKMBINDEX   TSH Recent Labs    12/12/18 1038  TSH 2.730      CARDIAC DATABASE: EKG: November 25, 2019: Normal sinus rhythm at 85 bpm, LVH per voltage criteria, ST-T changes in inferolateral leads most likely secondary to LVH but ischemia cannot be ruled out.  11/27/2019 22:22: Normal sinus rhythm, 80 bpm, LVH per voltage criteria and ST-T changes in inferolateral leads most likely secondary to LVH  but ischemia cannot be ruled out.  11/30/2019 11/30/2019: Normal sinus rhythm, 96 bpm, normal axis, LVH per voltage criteria, ST-T changes in leads I and aVL secondary to either ischemia or repolarization abnormality due to LVH.ST-T changes in the high lateral leads are new compared to prior ECG.    Echocardiogram: 11/30/2019: 1. Left ventricular ejection fraction, by estimation, is 60 to 65%. The  left ventricle has normal function. The left ventricle has no regional  wall motion abnormalities. There is severe left ventricular wall  thickening. Wall thickness is 2.5 cm average,  concerning for infiltrative cardiomyopathy  such as amyloidosis. Impaired  diastolic function with indeterminate LV filling pressure.  2. The aortic valve is tricuspid. Aortic valve regurgitation is not  visualized.  3. The pericardial effusion is localized near the right ventricle and  circumferential.  4. The inferior vena cava is dilated in size with >50% respiratory  variability, suggesting right atrial pressure of 8 mmHg.  5. Right ventricular systolic function is normal. The right ventricular  size is normal. There is normal pulmonary artery systolic pressure. The  estimated right ventricular systolic pressure is 17.6 mmHg.  6. The mitral valve is grossly normal. Trivial mitral valve  regurgitation.  Stress Testing:  NA  Heart Catheterization: None  IMPRESSION & RECOMMENDATIONS: Michael [name] is a 44 y.o. African-American male whose past medical history and cardiovascular risk factors include:  Hypertension, polycystic kidney disease with advanced chronic kidney disease, gout, noncompliance with follow-up as outpatient, former smoker, OSA on CPAP, obesity due to excess calories.  Primary Diagnosis:  Precordial chest pain with both typical and atypical features: Elevated troponin most likely secondary to supply demand ischemia:  Patient symptoms of chest pain have both typical and atypical features, EKG changes are dynamic but repolarization abnormalities can be also secondary to underlying LVH, no active chest pain.  The uptrend in high sensitive troponins can be multifactorial due to underlying chronic kidney disease, elevated blood pressures (213/115 on admission), LVH, but ischemia cannot be ruled out.  Given his underlying chronic kidney disease he is not a candidate for coronary CTA or left heart catheterization because he is high risk for progressive CKD requiring dialysis.  Shared decision was to proceed with Lexiscan to evaluate for reversible ischemia.  We will check a troponin levels prior to stress test to make  sure that the troponins are downtrending.  Echocardiogram personally reviewed.  LVEF is preserved without obvious regional wall motion abnormalities.  No significant valvular heart disease.  Patient has a small pericardial effusion.  Global longitudinal strain is reduced.  There is concern for possible infiltrative cardiomyopathy.  Echocardiographer recommended either cardiac MRI or PYP study.  Cardiac MRI is not an option given the clinical scenario of progressive chronic kidney disease.  Will consider PYP study on outpatient basis as he is already getting another nuclear medicine study today to evaluate for CAD.  Recommend improving antianginal therapy.  Discontinue atenolol.  Agree with coreg can uptitrate to 12.5mg  po bid to achieve goal HR of 60bpm.     Will prescribe sublingual nitroglycerin tablets to use on as needed basis at discharge. Patient educated on not using medications for erectile dysfunction while taking nitroglycerin tablets.  He understands that there are drug to drug interactions which may increase morbidity, mortality and even cause death.  Check Fasting lipid profile.  Recent a1c reviewed.   Blood pressures have improved since admission and managed by primary team.   Secondary Diagnosis: Acute on chronic kidney disease  stage V: Currently managed by nephrology.  Hypertension with chronic kidney disease stage V: Antihypertensive medications reviewed.  Recommend increasing carvedilol to 12.5 mg p.o. daily  Known polycystic kidney disease with advanced chronic kidney disease.  Former smoker: Educated on the importance of continued smoking cessation.  OSA on CPAP.  Obesity due to excess calories.  Patient's questions and concerns were addressed to his satisfaction. He voices understanding of the instructions provided during this encounter.   This note was created using a voice recognition software as a result there may be grammatical errors inadvertently enclosed that  do not reflect the nature of this encounter. Every attempt is made to correct such errors.  Will follow the patient with you. Thank-you for the consult.  Mechele [facility]  Pager: [phone] Office: [phone] 12/01/2019, 9:31 AM

## 2019-12-01 NOTE — Progress Notes (Signed)
Progress Note    Michael [name]  GXQ:[id number] DOB: 01/28/1976  DOA: 11/25/2019 PCP: [name], FNP    Brief Narrative:     Medical records reviewed and are as summarized below:  Michael [name] is an 44 y.o. male with medical history significant for hypertension, polycystic kidney disease with advanced CKD, and gout, now presenting to the emergency department for evaluation of hypertension and worsening renal failure.  Patient was following with nephrology in the outpatient setting but his management has been complicated by serial no-shows.  He has multiple complaints that wax and wanes each day-- N/V, constipation, headache, chest pain, SOB, rectal bleeding.   Assessment/Plan:   Principal Problem:   Renal failure Active Problems:   Hypertensive crisis   Adult polycystic kidney disease   Morbid obesity ([place])   Observed sleep apnea   Hyperkalemia   Normocytic anemia   Metabolic acidosis   Adjustment disorder with mixed anxiety and depressed mood   Chest pain -CE slightly elevated unclear significance in renal failure -echo: Left ventricular ejection fraction, by estimation, is 60 to 65%. The  left ventricle has normal function. The left ventricle has no regional  wall motion abnormalities. There is severe left ventricular wall  thickening. Wall thickness is 2.5 cm average,  concerning for infiltrative cardiomyopathy such as amyloidosis. Impaired  diastolic function with indeterminate LV filling pressure.  -cardiology consult appreciated: plan for stress test -if intervention like cath needed, will need renal to be re-notified  Acute kidney injury superimposed on CKD IV-V with polycystic kidney disease; hyperkalemia; acidosis; uremia   - Presents with worsening renal function and is found to have potassium 6.5, BUN 85, and bicarb 16  -follows with Dr. Hollie Salk at CKD since 2019, last visit 04/2019.  Was pursuing a more naturopathic lifestyle at that time with juicing.  At  that time his Cr was 4.2, K 6.  06/22/19 Labs with PCP showed K 6.4, BUN 58, Cr 5.6.  He missed several f/u with Dr. Hollie Salk since 04/2019 and had visit scheduled for next week.  - Nephrology consulting and much appreciated: Dr. Hollie Salk: I do not think he needs to start dialysis in the hospital.  I had a frank discussion with him today that he would probably need to start dialysis within the next month or two.  To do this, he needs an access and he needs to have regular followup with me.  He has an appointment with me 9/15 at 1:45 pm.  We discussed the importance of close followup.  Depression/anxiety -under a large amount of stress: financial, health etc -reported suicidal thoughts- patient denied -psych consult appreciated  Hypertensive urgency  - BP as high as 213/115 in setting of worsening renal function and not taking medications for the past week  - He has had occasional HA and b/l feet tingling but not complaining of that now and denies any focal weakness or vision change, denies chest pain  - Resume hydralazine at a higher dose and Norvasc   Anemia; rectal bleeding  - resolved -will defer GI consult to outpatient -bowel regimen to avoid constipation  obesity Body mass index is 34.71 kg/m.  Hypocalcemia -phos high -PTH high -started on calcitriol and calcium  OSA -CPAP QHS  Nausea and vomiting -not clearly related to food or position -situation varies daily  Constipation -good results with bowel regimen and enema   Family Communication/Anticipated D/C date and plan/Code Status   DVT prophylaxis: heparin Code Status: Full Code.  Disposition Plan: Status is: Inpatient  Remains inpatient appropriate because:Inpatient level of care appropriate due to severity of illness   Dispo: The patient is from: Home              Anticipated d/c is to: Home              Anticipated d/c date is: ?                       Medical Consultants:    Renal Psych cards    Subjective:   C/o SOB today with movement  Objective:    Vitals:   12/01/19 0500 12/01/19 0629 12/01/19 0819 12/01/19 1217  BP:  (!) 162/85 133/78 (!) 142/104  Pulse:  85 90 93  Resp:  18 16 18   Temp:  98.7 F (37.1 C)  99 F (37.2 C)  TempSrc:  Oral  Oral  SpO2:  100% 100% 95%  Weight: 119.3 kg     Height:        Intake/Output Summary (Last 24 hours) at 12/01/2019 1324 Last data filed at 12/01/2019 1216 Gross per 24 hour  Intake 980 ml  Output 1900 ml  Net -920 ml   Filed Weights   11/29/19 0229 11/30/19 0538 12/01/19 0500  Weight: 120.1 kg 119 kg 119.3 kg    Exam:    General: Appearance:    Obese male who appear anxious     Lungs:     Clear to auscultation bilaterally, respirations unlabored  Heart:    Normal heart rate.  MS:   All extremities are intact.   Neurologic:   Awake, alert, oriented x 3. No apparent focal neurological           defect.      Data Reviewed:   I have personally reviewed following labs and imaging studies:  Labs: Labs show the following:   Basic Metabolic Panel: Recent Labs  Lab 11/25/19 1848 11/25/19 2053 11/25/19 2053 11/25/19 2226 11/26/19 0344 11/26/19 0344 11/27/19 0456 11/27/19 0456 11/28/19 0604 11/28/19 0604 11/29/19 0546 11/30/19 0451  NA   < >  --   --   --  139  --  140  --  140  --  140 140  K   < > 5.9*   < > 5.3* 4.8   < > 4.7   < > 4.9   < > 4.8 4.8  CL   < >  --   --   --  111  --  108  --  107  --  107 104  CO2   < >  --   --   --  14*  --  18*  --  19*  --  20* 21*  GLUCOSE   < >  --   --   --  128*  --  86  --  90  --  98 93  BUN   < >  --   --   --  79*  --  76*  --  72*  --  71* 69*  CREATININE   < >  --   --   --  7.48*  --  7.45*  --  7.84*  --  7.93* 8.12*  CALCIUM   < >  --   --   --  6.7*  --  7.3*  --  7.7*  --  7.5* 7.8*  MG  --  1.7  --   --   --   --   --   --   --   --   --   --  PHOS  --   --   --  5.9*  --   --   --   --   --   --   --   --    < > =  values in this interval not displayed.   GFR Estimated Creatinine Clearance: 15.7 mL/min (A) (by C-G formula based on SCr of 8.12 mg/dL (H)). Liver Function Tests: Recent Labs  Lab 11/25/19 1848  AST 19  ALT 19  ALKPHOS 46  BILITOT 0.5  PROT 7.5  ALBUMIN 3.7   No results for input(s): LIPASE, AMYLASE in the last 168 hours. Recent Labs  Lab 11/25/19 2128  AMMONIA 48*   Coagulation profile No results for input(s): INR, PROTIME in the last 168 hours.  CBC: Recent Labs  Lab 11/26/19 0344 11/27/19 0456 11/28/19 0604 11/29/19 0546 11/30/19 0451  WBC 10.0 9.7 9.7 8.6 9.7  HGB 9.6* 9.6* 10.5* 10.1* 11.4*  HCT 31.3* 31.0* 33.8* 33.1* 36.6*  MCV 88.7 84.9 85.6 85.8 86.3  PLT 248 258 262 247 302   Cardiac Enzymes: No results for input(s): CKTOTAL, CKMB, CKMBINDEX, TROPONINI in the last 168 hours. BNP (last 3 results) No results for input(s): PROBNP in the last 8760 hours. CBG: Recent Labs  Lab 11/25/19 1746 11/25/19 2132  GLUCAP 89 82   D-Dimer: No results for input(s): DDIMER in the last 72 hours. Hgb A1c: No results for input(s): HGBA1C in the last 72 hours. Lipid Profile: No results for input(s): CHOL, HDL, LDLCALC, TRIG, CHOLHDL, LDLDIRECT in the last 72 hours. Thyroid function studies: No results for input(s): TSH, T4TOTAL, T3FREE, THYROIDAB in the last 72 hours.  Invalid input(s): FREET3 Anemia work up: No results for input(s): VITAMINB12, FOLATE, FERRITIN, TIBC, IRON, RETICCTPCT in the last 72 hours. Sepsis Labs: Recent Labs  Lab 11/27/19 0456 11/28/19 0604 11/29/19 0546 11/30/19 0451  WBC 9.7 9.7 8.6 9.7    Microbiology Recent Results (from the past 240 hour(s))  SARS Coronavirus 2 by RT PCR (hospital order, performed in [facility] hospital lab) Nasopharyngeal Nasopharyngeal Swab     Status: None   Collection Time: 11/25/19  9:28 PM   Specimen: Nasopharyngeal Swab  Result Value Ref Range Status   SARS Coronavirus 2 NEGATIVE NEGATIVE Final     Comment: (NOTE) SARS-CoV-2 target nucleic acids are NOT DETECTED.  The SARS-CoV-2 RNA is generally detectable in upper and lower respiratory specimens during the acute phase of infection. The lowest concentration of SARS-CoV-2 viral copies this assay can detect is 250 copies / mL. A negative result does not preclude SARS-CoV-2 infection and should not be used as the sole basis for treatment or other patient management decisions.  A negative result may occur with improper specimen collection / handling, submission of specimen other than nasopharyngeal swab, presence of viral mutation(s) within the areas targeted by this assay, and inadequate number of viral copies (<250 copies / mL). A negative result must be combined with clinical observations, patient history, and epidemiological information.  Fact Sheet for Patients:   StrictlyIdeas.no  Fact Sheet for Healthcare Providers: BankingDealers.co.za  This test is not yet approved or  cleared by the [place] FDA and has been authorized for detection and/or diagnosis of SARS-CoV-2 by FDA under an Emergency Use Authorization (EUA).  This EUA will remain in effect (meaning this test can be used) for the duration of the COVID-19 declaration under Section 564(b)(1) of the Act, 21 U.S.C. section 360bbb-3(b)(1), unless the authorization is terminated or revoked sooner.  Performed at [facility], [place] [address], [place], [place] [address]     Procedures and diagnostic studies:  ECHOCARDIOGRAM COMPLETE  Result Date: 11/30/2019    ECHOCARDIOGRAM REPORT   Patient Name:   JAVED COTTO Date of Exam: 11/30/2019 Medical Rec #:  [id number]    Height:       73.0 in Accession #:    [id number]   Weight:       262.3 lb Date of Birth:  11-22-1975     BSA:          2.414 m Patient Age:    34 years     BP:           142/85 mmHg Patient Gender: M            HR:           92 bpm. Exam Location:   Inpatient Procedure: 2D Echo, Cardiac Doppler and Color Doppler Indications:    Chest pain  History:        Patient has no prior history of Echocardiogram examinations.                 Risk Factors:Former Smoker, Sleep Apnea and Hypertension. CKD.                 Polycystic kidney disease.  Sonographer:    Clayton Lefort RDCS (AE) Referring Phys: [id number] Elias Bordner Alison Stalling  Sonographer Comments: Global longitudinal strain was attempted. IMPRESSIONS  1. Left ventricular ejection fraction, by estimation, is 60 to 65%. The left ventricle has normal function. The left ventricle has no regional wall motion abnormalities. There is severe left ventricular wall thickening. Wall thickness is 2.5 cm average,  concerning for infiltrative cardiomyopathy such as amyloidosis. Impaired diastolic function with indeterminate LV filling pressure.  2. The aortic valve is tricuspid. Aortic valve regurgitation is not visualized.  3. The pericardial effusion is localized near the right ventricle and circumferential.  4. The inferior vena cava is dilated in size with >50% respiratory variability, suggesting right atrial pressure of 8 mmHg.  5. Right ventricular systolic function is normal. The right ventricular size is normal. There is normal pulmonary artery systolic pressure. The estimated right ventricular systolic pressure is 70.0 mmHg.  6. The mitral valve is grossly normal. Trivial mitral valve regurgitation. Conclusion(s)/Recommendation(s): Consider further work-up for infiltrative cardiomyopathy or less likely HCM with cMRI or PYP scan. FINDINGS  Left Ventricle: Left ventricular ejection fraction, by estimation, is 60 to 65%. The left ventricle has normal function. The left ventricle has no regional wall motion abnormalities. The left ventricular internal cavity size was small. There is severe left ventricular hypertrophy. Left ventricular diastolic parameters are consistent with Grade I diastolic dysfunction (impaired relaxation).  Indeterminate filling pressures. Right Ventricle: The right ventricular size is normal. No increase in right ventricular wall thickness. Right ventricular systolic function is normal. There is normal pulmonary artery systolic pressure. The tricuspid regurgitant velocity is 2.55 m/s, and  with an assumed right atrial pressure of 8 mmHg, the estimated right ventricular systolic pressure is 17.4 mmHg. Left Atrium: Left atrial size was normal in size. Right Atrium: Right atrial size was normal in size. Pericardium: Trivial pericardial effusion is present. The pericardial effusion is localized near the right ventricle and circumferential. Mitral Valve: The mitral valve is grossly normal. Trivial mitral valve regurgitation. MV peak gradient, 3.0 mmHg. The mean mitral valve gradient is 1.0 mmHg. Tricuspid Valve: The tricuspid valve is grossly normal. Tricuspid valve  regurgitation is trivial. Aortic Valve: The aortic valve is tricuspid. Aortic valve regurgitation is not visualized. Aortic valve mean gradient measures 9.0 mmHg. Aortic valve peak gradient measures 15.4 mmHg. Pulmonic Valve: The pulmonic valve was grossly normal. Pulmonic valve regurgitation is trivial. Aorta: The aortic root and ascending aorta are structurally normal, with no evidence of dilitation. Venous: The inferior vena cava is dilated in size with greater than 50% respiratory variability, suggesting right atrial pressure of 8 mmHg. IAS/Shunts: No atrial level shunt detected by color flow Doppler.  LEFT VENTRICLE PLAX 2D LVIDd:         4.10 cm LVIDs:         2.70 cm LV PW:         2.60 cm LV IVS:        2.40 cm LVOT diam:     2.30 cm LVOT Area:     4.15 cm  RIGHT VENTRICLE          IVC RV Basal diam:  3.10 cm  IVC diam: 2.20 cm TAPSE (M-mode): 2.3 cm LEFT ATRIUM           Index       RIGHT ATRIUM           Index LA diam:      3.20 cm 1.33 cm/m  RA Area:     12.80 cm LA Vol (A4C): 52.0 ml 21.54 ml/m RA Volume:   26.00 ml  10.77 ml/m  AORTIC VALVE AV  Vmax:      196.00 cm/s AV Vmean:     135.000 cm/s AV VTI:       0.312 m AV Peak Grad: 15.4 mmHg AV Mean Grad: 9.0 mmHg  AORTA Ao Root diam: 3.60 cm Ao Asc diam:  3.10 cm MITRAL VALVE               TRICUSPID VALVE MV Area (PHT): 2.34 cm    TR Peak grad:   26.0 mmHg MV Peak grad:  3.0 mmHg    TR Vmax:        255.00 cm/s MV Mean grad:  1.0 mmHg MV Vmax:       0.87 m/s    SHUNTS MV Vmean:      55.1 cm/s   Systemic Diam: 2.30 cm MV Decel Time: 324 msec MV E velocity: 67.90 cm/s MV A velocity: 56.20 cm/s MV E/A ratio:  1.21 Lyman Bishop MD Electronically signed by Lyman Bishop MD Signature Date/Time: 11/30/2019/4:11:47 PM    Final     Medications:   . sodium chloride   Intravenous Once  . amLODipine  10 mg Oral Daily  . calcitRIOL  0.25 mcg Oral Daily  . calcium carbonate  1 tablet Oral TID  . carvedilol  6.25 mg Oral BID WC  . diclofenac Sodium  2 g Topical QID  . docusate sodium  100 mg Oral BID  . heparin injection (subcutaneous)  5,000 Units Subcutaneous Q8H  . hydrALAZINE  100 mg Oral Q8H  . pantoprazole  40 mg Oral BID AC  . sodium bicarbonate  650 mg Oral TID  . sodium chloride flush  3 mL Intravenous Q12H  . sodium chloride flush  3 mL Intravenous Q12H  . sodium zirconium cyclosilicate  10 g Oral BID   Continuous Infusions: . sodium chloride 250 mL (11/26/19 1228)  . ferric gluconate (FERRLECIT/NULECIT) IV 250 mg (11/26/19 1228)     LOS: 6 days   Geradine Girt  [facility]  How to contact the [facility] Attending or Consulting provider [place] or covering provider during after hours [place], for this patient?  1. Check the care team in [facility] and look for a) attending/consulting [facility] provider listed and b) the [facility] team listed 2. Log into [email or web address] and use [place]'s universal password to access. If you do not have the password, please contact the hospital operator. 3. Locate the [facility] provider you are looking for under [facility] and page to a number that you can be  directly reached. 4. If you still have difficulty reaching the provider, please page the [facility] (Director on Call) for the Hospitalists listed on amion for assistance.  12/01/2019, 1:24 PM

## 2019-12-02 DIAGNOSIS — N19 Unspecified kidney failure: Secondary | ICD-10-CM

## 2019-12-02 DIAGNOSIS — N185 Chronic kidney disease, stage 5: Secondary | ICD-10-CM

## 2019-12-02 DIAGNOSIS — R079 Chest pain, unspecified: Secondary | ICD-10-CM

## 2019-12-02 DIAGNOSIS — D631 Anemia in chronic kidney disease: Secondary | ICD-10-CM

## 2019-12-02 DIAGNOSIS — R778 Other specified abnormalities of plasma proteins: Secondary | ICD-10-CM

## 2019-12-02 LAB — LIPID PANEL
Cholesterol: 173 mg/dL (ref 0–200)
HDL: 25 mg/dL — ABNORMAL LOW (ref >40)
LDL Cholesterol: 95 mg/dL (ref 0–99)
Total CHOL/HDL Ratio: 6.9 RATIO
Triglycerides: 267 mg/dL — ABNORMAL HIGH (ref <150)
VLDL: 53 mg/dL — ABNORMAL HIGH (ref 0–40)

## 2019-12-02 LAB — BASIC METABOLIC PANEL
Anion gap: 13 (ref 5–15)
BUN: 75 mg/dL — ABNORMAL HIGH (ref 6–20)
CO2: 22 mmol/L (ref 22–32)
Calcium: 7.5 mg/dL — ABNORMAL LOW (ref 8.9–10.3)
Chloride: 103 mmol/L (ref 98–111)
Creatinine, Ser: 7.98 mg/dL — ABNORMAL HIGH (ref 0.61–1.24)
GFR calc Af Amer: 9 mL/min — ABNORMAL LOW (ref >60)
GFR calc non Af Amer: 7 mL/min — ABNORMAL LOW (ref >60)
Glucose, Bld: 91 mg/dL (ref 70–99)
Potassium: 4.6 mmol/L (ref 3.5–5.1)
Sodium: 138 mmol/L (ref 135–145)

## 2019-12-02 LAB — CBC
HCT: 31.1 % — ABNORMAL LOW (ref 39.0–52.0)
Hemoglobin: 9.5 g/dL — ABNORMAL LOW (ref 13.0–17.0)
MCH: 26.3 pg (ref 26.0–34.0)
MCHC: 30.5 g/dL (ref 30.0–36.0)
MCV: 86.1 fL (ref 80.0–100.0)
Platelets: 257 10*3/uL (ref 150–400)
RBC: 3.61 MIL/uL — ABNORMAL LOW (ref 4.22–5.81)
RDW: 15.2 % (ref 11.5–15.5)
WBC: 9.5 10*3/uL (ref 4.0–10.5)
nRBC: 0 % (ref 0.0–0.2)

## 2019-12-02 MED ORDER — CALCIUM CARBONATE ANTACID 500 MG PO CHEW: 1.0000 | CHEWABLE_TABLET | Freq: Three times a day (TID) | ORAL | 2 refills | Status: DC

## 2019-12-02 MED ORDER — AMINOPHYLLINE 25 MG/ML IV (NUC MED): 75.0000 mg | Freq: Once | INTRAVENOUS | Status: AC

## 2019-12-02 MED ORDER — ASPIRIN EC 81 MG PO TBEC: 81.0000 mg | DELAYED_RELEASE_TABLET | Freq: Every day | ORAL | Status: DC

## 2019-12-02 MED ORDER — CARVEDILOL 12.5 MG PO TABS
12.5000 mg | ORAL_TABLET | Freq: Two times a day (BID) | ORAL | 1 refills | Status: DC
Start: 2019-12-02 — End: 2019-12-14

## 2019-12-02 MED ORDER — ROSUVASTATIN CALCIUM 10 MG PO TABS: 10.0000 mg | ORAL_TABLET | Freq: Every day | ORAL | 0 refills | Status: DC

## 2019-12-02 MED ORDER — AMINOPHYLLINE 25 MG/ML IV SOLN
INTRAVENOUS | Status: AC
  Administered 2019-12-02: 75 mg via INTRAVENOUS
  Filled 2019-12-02: qty 10

## 2019-12-02 MED ORDER — NITROGLYCERIN 0.4 MG SL SUBL: 0.4000 mg | SUBLINGUAL_TABLET | SUBLINGUAL | 0 refills | Status: DC | PRN

## 2019-12-02 MED ORDER — REGADENOSON 0.4 MG/5ML IV SOLN: 0.4000 mg | Freq: Once | INTRAVENOUS | Status: AC

## 2019-12-02 MED ORDER — CAMPHOR-MENTHOL 0.5-0.5 % EX LOTN: TOPICAL_LOTION | CUTANEOUS | 1 refills | Status: DC | PRN

## 2019-12-02 MED ORDER — REGADENOSON 0.4 MG/5ML IV SOLN
INTRAVENOUS | Status: AC
  Administered 2019-12-02: 0.4 mg via INTRAVENOUS
  Filled 2019-12-02: qty 5

## 2019-12-02 MED ORDER — ISOSORBIDE DINITRATE 20 MG PO TABS: 20.0000 mg | ORAL_TABLET | Freq: Three times a day (TID) | ORAL | 1 refills | Status: DC

## 2019-12-02 MED ORDER — TECHNETIUM TC 99M TETROFOSMIN IV KIT
30.2000 | PACK | Freq: Once | INTRAVENOUS | Status: AC | PRN
  Administered 2019-12-02: 30.2 via INTRAVENOUS

## 2019-12-02 MED FILL — ISOSORBIDE DN 20 MG TABLET: 20 | 30 days supply | Qty: 90 | Fill #0

## 2019-12-02 MED FILL — ROSUVASTATIN CALCIUM 10 MG: 10 | 30 days supply | Qty: 30 | Fill #0

## 2019-12-02 MED FILL — NITROGLYCERIN 0.4 MG TAB SL: 0.4 | 30 days supply | Qty: 100 | Fill #0

## 2019-12-02 MED FILL — CARVEDILOL 12.5 MG TABLET: 12.5 | 30 days supply | Qty: 60 | Fill #0

## 2019-12-02 NOTE — TOC Transition Note (Signed)
Transition of Care [facility]) - CM/SW Discharge Note   Patient Details  Name: Michael [name] MRN: [id number] Date of Birth: [birth year]  Transition of [facility]) CM/SW Contact:  Zenon Mayo, RN Phone Number: 12/02/2019, 3:03 PM   Clinical Narrative:    Patient is for possible dc today, TOC has filled his medications for him. Already and bringing the meds to the room.  He has a follow up apt scheduled , he has no other needs.    Final next level of care: Home/Self Care Barriers to Discharge: No Barriers Identified   Patient Goals and CMS Choice Patient states their goals for this hospitalization and ongoing recovery are:: get better   Choice offered to / list presented to : NA  Discharge Placement                       Discharge Plan and Services                  DME Agency: NA       HH Arranged: NA          Social Determinants of Health (SDOH) Interventions     Readmission Risk Interventions No flowsheet data found.

## 2019-12-02 NOTE — Discharge Summary (Signed)
Physician Discharge Summary  Michael [name] ZWC:[id number] DOB: 03-May-1975 DOA: 11/25/2019  PCP: [name], FNP  Admit date: 11/25/2019 Discharge date: 12/02/2019  Recommendations for Outpatient Follow-up:  CKD 5 secondary to PKD  Chest pain w/ abnormal stress --see discussion below  Reported rectal bleeding prior to admission --Consider follow-up with GI as an outpatient.   Follow-up [place]. Go on 12/01/2019.   Specialty: Internal Medicine Why: @9 :45am Contact information: [place] [phone]       Madelon Lips, MD Follow up on 12/03/2019.   Specialty: Nephrology Why: 10:30 Contact information: [place] [address] [phone]        [name], FNP. Schedule an appointment as soon as possible for a visit in 1 week(s).   Specialty: Family Medicine Contact information: [address] [place] [address] [phone]        Rex Kras, DO Follow up.   Specialties: Cardiology, Radiology, Vascular Surgery Why: 12/18/2019 at 930am.  Contact information: [place] [address] [phone]                Discharge Diagnoses: Principal diagnosis is #1 Principal Problem:   CKD (chronic kidney disease), stage V ([place]) Active Problems:   Adult polycystic kidney disease   Hyperkalemia   Metabolic acidosis   Chronic gout due to renal impairment without tophus   Adjustment disorder with mixed anxiety and depressed mood   Observed sleep apnea   Uremia   Chest pain   Elevated troponin   Anemia in CKD (chronic kidney disease)   Discharge Condition: improved Disposition: home  Diet recommendation: low potassium, low protein diet  Filed Weights   11/30/19 0538 12/01/19 0500 12/02/19 0616  Weight: 119 kg 119.3 kg 119.4 kg    History of present illness/Hospital Course:  44 year old man PMH CKD secondary to  ADPKD followed by Dr. Hollie Salk presented with multiple complaints including abnormal lab work indicating worsening renal function, recent treatment for gout with Indocin and allopurinol, dark stools, vomiting.  Admitted for acute kidney injury superimposed on CKD stage V.  Was seen by nephrology for hyperkalemia and treated medically.  Low potassium diet recommended.  No urgent need for dialysis although anticipated in the next few months.  Reported some anxiety secondary to stressors, reported suicidal thoughts.  Was seen by psychiatry, patient denied suicidal thoughts and was cleared by psychiatry.  Hospitalization was prolonged by intermittent nausea and vomiting.  Had an episode of chest pain, cardiac markers were checked and were elevated and thus cardiology was consulted.  Echocardiogram showed preserved left ventricular systolic function. Nuclear stress test showed intermediate risk.  Discussed in detail with consulting cardiologist Dr. Terri Skains, given the patient is chest pain-free and feeling well, he recommended medical management and discharge home with outpatient follow-up closely.  Recommended adding Isordil, aspirin and statin.  Of note I counseled the patient on the need to completely avoid all erectile dysfunction drugs and he understood this and plans to take none.  CKD 5 secondary to PKD with associated uremia symptoms, hyperkalemia on admission, anemia of CKD, nonanion gap metabolic acidosis, generalized weakness. --Hyperkalemia treated medically.  Per nephrology continue [facility] twice daily as an outpatient. --Nausea and vomiting improved with blood pressure control --Low potassium diet --Continue oral sodium bicarbonate 650 mg 3 times daily --Nephrology anticipates starting dialysis in the next 2 months. --Has appointment 9/16  with Dr. Hollie Salk.  Anemia of CKD --Treated by nephrology  Chest pain --Echocardiogram showed preserved left ventricular systolic function.  Severe LVH concerning  for infiltrative cardiomyopathy.   --Cardiology consulted, recommended Lexiscan which showed intermediate risk.  Cardiology recommended medical management, close outpatient follow-up and adjustment of medications. --Concern for possible infiltrative cardiomyopathy.  Cardiology considering PYP study on outpatient basis. --Antianginal therapy was modified.  Atenolol discontinued.  Started on carvedilol. --Sublingual nitroglycerin tablet prescription on discharge.  Avoid erectile dysfunction treatments while taking nitroglycerin and Isordil.  Elevated troponin secondary to supply demand ischemia  Essential hypertension with hypertensive urgency secondary to noncompliance with medications --Stable.  Continue hydralazine, amlodipine  Hypocalcemia --Calcitriol 0.25 mcg daily and Tums 1 tablet 3 times daily per nephrology  Gout --Continue allopurinol.  Reported rectal bleeding prior to admission --Hemoglobin stable.  Follow-up with GI as an outpatient. --Continue PPI  Situational stress, anxiety.  Reported suicidal thoughts earlier in hospitalization. --Seen by psychiatry with recommendation for outpatient referral for counseling with [facility] [place].  Cleared by psychiatry, no need for inpatient psychiatric treatment.   Consults:  . Nephrology  . Cardiology   Today's assessment: S: feels much better; ate all his lunch; no n/v. Likely another BM soon. No issues with depression today O: Vitals:  Vitals:   12/02/19 0935 12/02/19 1124  BP: (!) 177/77 134/69  Pulse: (!) 110 100  Resp:  20  Temp:  99.2 F (37.3 C)  SpO2:  92%    Constitutional:  . Appears calm and comfortable, smiling Respiratory:  . CTA bilaterally, no w/r/r.  . Respiratory effort normal.  Cardiovascular:  . RRR, no m/r/g Psychiatric:  . Mental status o Mood, affect appropriate  Creatinine stable at 7.98 Troponin down to 75 LDL 95 Hgb stable at 9.5  Discharge Instructions  Discharge  Instructions    Diet - low sodium heart healthy   Complete by: As directed    Discharge instructions   Complete by: As directed    Call your physician or seek immediate medical attention for chest pain, shortness of breath, swelling or worsening of condition. Very low potassium diet. Low protein diet.   Increase activity slowly   Complete by: As directed      Allergies as of 12/02/2019   No Known Allergies     Medication List    STOP taking these medications   APPLE CIDER VINEGAR PO   atenolol 25 MG tablet Commonly known as: TENORMIN   hydrocortisone 1 % ointment   indomethacin 25 MG capsule Commonly known as: INDOCIN   losartan 50 MG tablet Commonly known as: COZAAR     TAKE these medications   allopurinol 100 MG tablet Commonly known as: ZYLOPRIM Take 1 tablet (100 mg total) by mouth 2 (two) times daily.   amLODipine 10 MG tablet Commonly known as: NORVASC Take 1 tablet (10 mg total) by mouth daily.   aspirin EC 81 MG tablet Take 1 tablet (81 mg total) by mouth daily. Swallow whole.   calcitRIOL 0.25 MCG capsule Commonly known as: ROCALTROL Take 1 capsule (0.25 mcg total) by mouth daily.   calcium carbonate 500 MG chewable tablet Commonly known as: TUMS - dosed in mg elemental calcium Chew 1 tablet (200 mg of elemental calcium total) by mouth 3 (three) times daily.   camphor-menthol lotion Commonly known as: SARNA Apply topically as needed for itching.   carvedilol 12.5 MG tablet Commonly known as: COREG Take 1 tablet (12.5 mg total) by  mouth 2 (two) times daily with a meal.   hydrALAZINE 100 MG tablet Commonly known as: APRESOLINE Take 1 tablet (100 mg total) by mouth every 8 (eight) hours. What changed:   medication strength  how much to take   isosorbide dinitrate 20 MG tablet Commonly known as: ISORDIL Take 1 tablet (20 mg total) by mouth 3 (three) times daily.   multivitamin Tabs tablet Take 1 tablet by mouth daily.   nitroGLYCERIN 0.4  MG SL tablet Commonly known as: Nitrostat Place 1 tablet (0.4 mg total) under the tongue every 5 (five) minutes as needed for chest pain.   rosuvastatin 10 MG tablet Commonly known as: Crestor Take 1 tablet (10 mg total) by mouth daily.   sodium bicarbonate 650 MG tablet Take 1 tablet (650 mg total) by mouth 3 (three) times daily.   sodium zirconium cyclosilicate 10 g Pack packet Commonly known as: LOKELMA Take 10 g by mouth 2 (two) times daily.      No Known Allergies  The results of significant diagnostics from this hospitalization (including imaging, microbiology, ancillary and laboratory) are listed below for reference.    Significant Diagnostic Studies: DG Abd 1 View  Result Date: 11/27/2019 CLINICAL DATA:  Nausea EXAM: X-RAY ABDOMEN 1 VIEW COMPARISON:  03/28/2017 FINDINGS: The bowel gas pattern is normal. No radio-opaque calculi or other significant radiographic abnormality are seen. IMPRESSION: Negative. Electronically Signed   By: Donavan Foil M.D.   On: 11/27/2019 18:18   NM Myocar Multi W/Spect Tamela Oddi Motion / EF  Result Date: 12/02/2019 CLINICAL DATA:  Chest pain. EXAM: MYOCARDIAL IMAGING WITH SPECT (REST AND PHARMACOLOGIC-STRESS) GATED LEFT VENTRICULAR WALL MOTION STUDY LEFT VENTRICULAR EJECTION FRACTION TECHNIQUE: Standard myocardial SPECT imaging was performed after resting intravenous injection of 10 mCi Tc-77m tetrofosmin. Subsequently, intravenous infusion of Lexiscan was performed under the supervision of the Cardiology staff. At peak effect of the drug, 30 mCi Tc-54m tetrofosmin was injected intravenously and standard myocardial SPECT imaging was performed. Quantitative gated imaging was also performed to evaluate left ventricular wall motion, and estimate left ventricular ejection fraction. COMPARISON:  None. FINDINGS: Perfusion: Moderate decreased perfusion to a moderate size defect in the mid segment lateral wall which improves from stress to rest. Wall Motion: LEFT  ventricular dilatation.  Mild global hypokinesia. Left Ventricular Ejection Fraction: 42 % End diastolic volume 453 ml End systolic volume 97 ml IMPRESSION: 1. Moderate size region of reversible ischemia in the mid segment lateral wall. 2. Global hypokinesia and LEFT ventricular dilatation. 3. Left ventricular ejection fraction 42% 4. Non invasive risk stratification*: Intermediate *2012 Appropriate Use Criteria for Coronary Revascularization Focused Update: J Am Coll Cardiol. 6468;03(2):122-482. http://content.airportbarriers.com.aspx?articleid=1201161 These results will be called to the ordering clinician or representative by the Radiologist Assistant, and communication documented in the PACS or [employer]. Electronically Signed   By: Suzy Bouchard M.D.   On: 12/02/2019 13:40   ECHOCARDIOGRAM COMPLETE  Result Date: 11/30/2019    ECHOCARDIOGRAM REPORT   Patient Name:   Michael [name] Date of Exam: 11/30/2019 Medical Rec #:  [id number]    Height:       73.0 in Accession #:    [id number]   Weight:       262.3 lb Date of Birth:  07-03-1975     BSA:          2.414 m Patient Age:    87 years     BP:           142/85 mmHg Patient Gender:  M            HR:           92 bpm. Exam Location:  Inpatient Procedure: 2D Echo, Cardiac Doppler and Color Doppler Indications:    Chest pain  History:        Patient has no prior history of Echocardiogram examinations.                 Risk Factors:Former Smoker, Sleep Apnea and Hypertension. CKD.                 Polycystic kidney disease.  Sonographer:    Clayton Lefort RDCS (AE) Referring Phys: [id number] JESSICA Alison Stalling  Sonographer Comments: Global longitudinal strain was attempted. IMPRESSIONS  1. Left ventricular ejection fraction, by estimation, is 60 to 65%. The left ventricle has normal function. The left ventricle has no regional wall motion abnormalities. There is severe left ventricular wall thickening. Wall thickness is 2.5 cm average,  concerning for infiltrative  cardiomyopathy such as amyloidosis. Impaired diastolic function with indeterminate LV filling pressure.  2. The aortic valve is tricuspid. Aortic valve regurgitation is not visualized.  3. The pericardial effusion is localized near the right ventricle and circumferential.  4. The inferior vena cava is dilated in size with >50% respiratory variability, suggesting right atrial pressure of 8 mmHg.  5. Right ventricular systolic function is normal. The right ventricular size is normal. There is normal pulmonary artery systolic pressure. The estimated right ventricular systolic pressure is 95.6 mmHg.  6. The mitral valve is grossly normal. Trivial mitral valve regurgitation. Conclusion(s)/Recommendation(s): Consider further work-up for infiltrative cardiomyopathy or less likely HCM with cMRI or PYP scan. FINDINGS  Left Ventricle: Left ventricular ejection fraction, by estimation, is 60 to 65%. The left ventricle has normal function. The left ventricle has no regional wall motion abnormalities. The left ventricular internal cavity size was small. There is severe left ventricular hypertrophy. Left ventricular diastolic parameters are consistent with Grade I diastolic dysfunction (impaired relaxation). Indeterminate filling pressures. Right Ventricle: The right ventricular size is normal. No increase in right ventricular wall thickness. Right ventricular systolic function is normal. There is normal pulmonary artery systolic pressure. The tricuspid regurgitant velocity is 2.55 m/s, and  with an assumed right atrial pressure of 8 mmHg, the estimated right ventricular systolic pressure is 21.3 mmHg. Left Atrium: Left atrial size was normal in size. Right Atrium: Right atrial size was normal in size. Pericardium: Trivial pericardial effusion is present. The pericardial effusion is localized near the right ventricle and circumferential. Mitral Valve: The mitral valve is grossly normal. Trivial mitral valve regurgitation. MV peak  gradient, 3.0 mmHg. The mean mitral valve gradient is 1.0 mmHg. Tricuspid Valve: The tricuspid valve is grossly normal. Tricuspid valve regurgitation is trivial. Aortic Valve: The aortic valve is tricuspid. Aortic valve regurgitation is not visualized. Aortic valve mean gradient measures 9.0 mmHg. Aortic valve peak gradient measures 15.4 mmHg. Pulmonic Valve: The pulmonic valve was grossly normal. Pulmonic valve regurgitation is trivial. Aorta: The aortic root and ascending aorta are structurally normal, with no evidence of dilitation. Venous: The inferior vena cava is dilated in size with greater than 50% respiratory variability, suggesting right atrial pressure of 8 mmHg. IAS/Shunts: No atrial level shunt detected by color flow Doppler.  LEFT VENTRICLE PLAX 2D LVIDd:         4.10 cm LVIDs:         2.70 cm LV PW:  2.60 cm LV IVS:        2.40 cm LVOT diam:     2.30 cm LVOT Area:     4.15 cm  RIGHT VENTRICLE          IVC RV Basal diam:  3.10 cm  IVC diam: 2.20 cm TAPSE (M-mode): 2.3 cm LEFT ATRIUM           Index       RIGHT ATRIUM           Index LA diam:      3.20 cm 1.33 cm/m  RA Area:     12.80 cm LA Vol (A4C): 52.0 ml 21.54 ml/m RA Volume:   26.00 ml  10.77 ml/m  AORTIC VALVE AV Vmax:      196.00 cm/s AV Vmean:     135.000 cm/s AV VTI:       0.312 m AV Peak Grad: 15.4 mmHg AV Mean Grad: 9.0 mmHg  AORTA Ao Root diam: 3.60 cm Ao Asc diam:  3.10 cm MITRAL VALVE               TRICUSPID VALVE MV Area (PHT): 2.34 cm    TR Peak grad:   26.0 mmHg MV Peak grad:  3.0 mmHg    TR Vmax:        255.00 cm/s MV Mean grad:  1.0 mmHg MV Vmax:       0.87 m/s    SHUNTS MV Vmean:      55.1 cm/s   Systemic Diam: 2.30 cm MV Decel Time: 324 msec MV E velocity: 67.90 cm/s MV A velocity: 56.20 cm/s MV E/A ratio:  1.21 Lyman Bishop MD Electronically signed by Lyman Bishop MD Signature Date/Time: 11/30/2019/4:11:47 PM    Final    VAS [place] UPPER EXT VEIN MAPPING (PRE-OP AVF)  Result Date: 11/27/2019 UPPER EXTREMITY VEIN  MAPPING Performing Technologist: Griffin Basil RCT RDMS  Examination Guidelines: A complete evaluation includes B-mode imaging, spectral Doppler, color Doppler, and power Doppler as needed of all accessible portions of each vessel. Bilateral testing is considered an integral part of a complete examination. Limited examinations for reoccurring indications may be performed as noted. +-----------------+-------------+----------+---------+ Right Cephalic   Diameter (cm)Depth (cm)Findings  +-----------------+-------------+----------+---------+ Shoulder             0.15        2.13             +-----------------+-------------+----------+---------+ Prox upper arm       0.24        2.40             +-----------------+-------------+----------+---------+ Mid upper arm        0.24        1.30   branching +-----------------+-------------+----------+---------+ Dist upper arm       0.28        1.00             +-----------------+-------------+----------+---------+ Antecubital fossa    0.16        0.60   branching +-----------------+-------------+----------+---------+ Prox forearm         0.14        0.21             +-----------------+-------------+----------+---------+ Mid forearm          0.16        0.21             +-----------------+-------------+----------+---------+ Dist forearm         0.16  0.20             +-----------------+-------------+----------+---------+ Wrist                0.17        0.27             +-----------------+-------------+----------+---------+ +-----------------+-------------+----------+---------+ Right Basilic    Diameter (cm)Depth (cm)Findings  +-----------------+-------------+----------+---------+ Shoulder             0.57                         +-----------------+-------------+----------+---------+ Prox upper arm       0.56                         +-----------------+-------------+----------+---------+ Mid upper arm         0.56                         +-----------------+-------------+----------+---------+ Dist upper arm       0.69                         +-----------------+-------------+----------+---------+ Antecubital fossa    0.46               branching +-----------------+-------------+----------+---------+ Prox forearm         0.33               branching +-----------------+-------------+----------+---------+ Mid forearm          0.36                         +-----------------+-------------+----------+---------+ Distal forearm       0.23                         +-----------------+-------------+----------+---------+ Wrist                0.19                         +-----------------+-------------+----------+---------+ +-----------------+-------------+----------+---------+ Left Cephalic    Diameter (cm)Depth (cm)Findings  +-----------------+-------------+----------+---------+ Shoulder             0.24        1.90             +-----------------+-------------+----------+---------+ Prox upper arm       0.21        0.53             +-----------------+-------------+----------+---------+ Mid upper arm        0.23        0.50             +-----------------+-------------+----------+---------+ Dist upper arm       0.26        0.51   branching +-----------------+-------------+----------+---------+ Antecubital fossa    0.19        0.39             +-----------------+-------------+----------+---------+ Prox forearm         0.15        0.21             +-----------------+-------------+----------+---------+ Mid forearm          0.10        0.19             +-----------------+-------------+----------+---------+ Dist forearm  0.13        0.21             +-----------------+-------------+----------+---------+ Wrist                0.14        0.30             +-----------------+-------------+----------+---------+  +-----------------+-------------+----------+---------+ Left Basilic     Diameter (cm)Depth (cm)Findings  +-----------------+-------------+----------+---------+ Shoulder             0.52                         +-----------------+-------------+----------+---------+ Prox upper arm       0.34                         +-----------------+-------------+----------+---------+ Mid upper arm        0.35                         +-----------------+-------------+----------+---------+ Dist upper arm       0.39                         +-----------------+-------------+----------+---------+ Antecubital fossa    0.39               branching +-----------------+-------------+----------+---------+ Prox forearm         0.30                         +-----------------+-------------+----------+---------+ Mid forearm          0.26                         +-----------------+-------------+----------+---------+ Distal forearm       0.19                         +-----------------+-------------+----------+---------+ Wrist                0.25                         +-----------------+-------------+----------+---------+ *See table(s) above for measurements and observations.  Diagnosing physician: Monica Martinez MD Electronically signed by Monica Martinez MD on 11/27/2019 at 4:40:42 PM.    Final     Microbiology: Recent Results (from the past 240 hour(s))  SARS Coronavirus 2 by RT PCR (hospital order, performed in [facility] hospital lab) Nasopharyngeal Nasopharyngeal Swab     Status: None   Collection Time: 11/25/19  9:28 PM   Specimen: Nasopharyngeal Swab  Result Value Ref Range Status   SARS Coronavirus 2 NEGATIVE NEGATIVE Final    Comment: (NOTE) SARS-CoV-2 target nucleic acids are NOT DETECTED.  The SARS-CoV-2 RNA is generally detectable in upper and lower respiratory specimens during the acute phase of infection. The lowest concentration of SARS-CoV-2 viral copies this assay  can detect is 250 copies / mL. A negative result does not preclude SARS-CoV-2 infection and should not be used as the sole basis for treatment or other patient management decisions.  A negative result may occur with improper specimen collection / handling, submission of specimen other than nasopharyngeal swab, presence of viral mutation(s) within the areas targeted by this assay, and inadequate number of viral copies (<250 copies / mL). A negative result must be combined with clinical  observations, patient history, and epidemiological information.  Fact Sheet for Patients:   StrictlyIdeas.no  Fact Sheet for Healthcare Providers: BankingDealers.co.za  This test is not yet approved or  cleared by the [place] FDA and has been authorized for detection and/or diagnosis of SARS-CoV-2 by FDA under an Emergency Use Authorization (EUA).  This EUA will remain in effect (meaning this test can be used) for the duration of the COVID-19 declaration under Section 564(b)(1) of the Act, 21 U.S.C. section 360bbb-3(b)(1), unless the authorization is terminated or revoked sooner.  Performed at [facility], [place] [address], [place], [place] [address]      Labs: Basic Metabolic Panel: Recent Labs  Lab 11/25/19 2053 11/25/19 2053 11/25/19 2226 11/26/19 0344 11/27/19 0456 11/28/19 0604 11/29/19 0546 11/30/19 0451 12/02/19 0600  NA  --   --   --    < > 140 140 140 140 138  K 5.9*   < > 5.3*   < > 4.7 4.9 4.8 4.8 4.6  CL  --   --   --    < > 108 107 107 104 103  CO2  --   --   --    < > 18* 19* 20* 21* 22  GLUCOSE  --   --   --    < > 86 90 98 93 91  BUN  --   --   --    < > 76* 72* 71* 69* 75*  CREATININE  --   --   --    < > 7.45* 7.84* 7.93* 8.12* 7.98*  CALCIUM  --   --   --    < > 7.3* 7.7* 7.5* 7.8* 7.5*  MG 1.7  --   --   --   --   --   --   --   --   PHOS  --   --  5.9*  --   --   --   --   --   --    < > = values in this  interval not displayed.   Liver Function Tests: No results for input(s): AST, ALT, ALKPHOS, BILITOT, PROT, ALBUMIN in the last 168 hours.  Recent Labs  Lab 11/25/19 2128  AMMONIA 48*   CBC: Recent Labs  Lab 11/27/19 0456 11/28/19 0604 11/29/19 0546 11/30/19 0451 12/02/19 0600  WBC 9.7 9.7 8.6 9.7 9.5  HGB 9.6* 10.5* 10.1* 11.4* 9.5*  HCT 31.0* 33.8* 33.1* 36.6* 31.1*  MCV 84.9 85.6 85.8 86.3 86.1  PLT 258 262 247 302 257    CBG: Recent Labs  Lab 11/25/19 2132  GLUCAP 82    Principal Problem:   CKD (chronic kidney disease), stage V ([facility]) Active Problems:   Adult polycystic kidney disease   Hyperkalemia   Metabolic acidosis   Chronic gout due to renal impairment without tophus   Adjustment disorder with mixed anxiety and depressed mood   Observed sleep apnea   Uremia   Chest pain   Elevated troponin   Anemia in CKD (chronic kidney disease)   Time coordinating discharge: 45 minutes  Signed:  Murray Hodgkins, MD  [facility]  12/02/2019, 7:34 PM

## 2019-12-02 NOTE — Plan of Care (Signed)
  Problem: Education: Goal: Knowledge of General Education information will improve Description Including pain rating scale, medication(s)/side effects and non-pharmacologic comfort measures Outcome: Adequate for Discharge   Problem: Education: Goal: Knowledge of General Education information will improve Description Including pain rating scale, medication(s)/side effects and non-pharmacologic comfort measures Outcome: Adequate for Discharge   Problem: Health Behavior/Discharge Planning: Goal: Ability to manage health-related needs will improve Outcome: Adequate for Discharge   Problem: Clinical Measurements: Goal: Ability to maintain clinical measurements within normal limits will improve Outcome: Adequate for Discharge Goal: Will remain free from infection Outcome: Adequate for Discharge Goal: Diagnostic test results will improve Outcome: Adequate for Discharge Goal: Respiratory complications will improve Outcome: Adequate for Discharge Goal: Cardiovascular complication will be avoided Outcome: Adequate for Discharge   Problem: Activity: Goal: Risk for activity intolerance will decrease Outcome: Adequate for Discharge   Problem: Nutrition: Goal: Adequate nutrition will be maintained Outcome: Adequate for Discharge   Problem: Coping: Goal: Level of anxiety will decrease Outcome: Adequate for Discharge   Problem: Elimination: Goal: Will not experience complications related to bowel motility Outcome: Adequate for Discharge Goal: Will not experience complications related to urinary retention Outcome: Adequate for Discharge   Problem: Pain Managment: Goal: General experience of comfort will improve Outcome: Adequate for Discharge   Problem: Safety: Goal: Ability to remain free from injury will improve Outcome: Adequate for Discharge   Problem: Skin Integrity: Goal: Risk for impaired skin integrity will decrease Outcome: Adequate for Discharge   

## 2019-12-02 NOTE — Hospital Course (Addendum)
44 year old man PMH CKD secondary to ADPKD followed by Dr. Hollie Salk presented with multiple complaints including abnormal lab work indicating worsening renal function, recent treatment for gout with Indocin and allopurinol, dark stools, vomiting.  Admitted for acute kidney injury superimposed on CKD stage V.  Was seen by nephrology for hyperkalemia and treated medically.  Low potassium diet recommended.  No urgent need for dialysis although anticipated in the next few months.  Reported some anxiety secondary to stressors, reported suicidal thoughts.  Was seen by psychiatry, patient denied suicidal thoughts and was cleared by psychiatry.  Hospitalization was prolonged by intermittent nausea and vomiting.  Had an episode of chest pain, cardiac markers were checked and were elevated and thus cardiology was consulted.  Echocardiogram showed preserved left ventricular systolic function.

## 2019-12-02 NOTE — Progress Notes (Deleted)
Patient wife has expressed that she wants a palliative care consult. Made MD aware. Wife will be at the bedside later today.

## 2019-12-02 NOTE — Progress Notes (Signed)
Subjective:  C/O sharp localized chest pain. States his present poor health is due to ignoring his medical issues and wants to make changes.  Intake/Output from previous day:  I/O last 3 completed shifts: In: 1340 [P.O.:1340] Out: 2750 [Urine:2750] Total I/O In: 106 [P.O.:220; I.V.:3] Out: 300 [Urine:300]  Blood pressure 134/69, pulse 100, temperature 99.2 F (37.3 C), temperature source Oral, resp. rate 20, height 6' 1" (1.854 m), weight 119.4 kg, SpO2 92 %. Physical Exam Constitutional:      Comments: Morbidly obese in no acute distress.  Cardiovascular:     Rate and Rhythm: Normal rate and regular rhythm.     Pulses:          Carotid pulses are 2+ on the right side and 2+ on the left side.      Dorsalis pedis pulses are 2+ on the right side and 2+ on the left side.       Posterior tibial pulses are 2+ on the right side and 2+ on the left side.     Heart sounds: Normal heart sounds. No murmur heard.  No gallop.      Comments: Femoral and popliteal pulse difficult to feel due to patient's body habitus.  No leg edema. JVD difficult to see due to short neck. Pulmonary:     Effort: Pulmonary effort is normal.     Breath sounds: Normal breath sounds.  Abdominal:     General: Bowel sounds are normal.     Palpations: Abdomen is soft.     Comments: Obese. Pannus present     Lab Results: BMP BNP (last 3 results) No results for input(s): BNP in the last 8760 hours.  ProBNP (last 3 results) No results for input(s): PROBNP in the last 8760 hours. BMP Latest Ref Rng & Units 12/02/2019 11/30/2019 11/29/2019  Glucose 70 - 99 mg/dL 91 93 98  BUN 6 - 20 mg/dL 75(H) 69(H) 71(H)  Creatinine 0.61 - 1.24 mg/dL 7.98(H) 8.12(H) 7.93(H)  BUN/Creat Ratio 9 - 20 - - -  Sodium 135 - 145 mmol/L 138 140 140  Potassium 3.5 - 5.1 mmol/L 4.6 4.8 4.8  Chloride 98 - 111 mmol/L 103 104 107  CO2 22 - 32 mmol/L 22 21(L) 20(L)  Calcium 8.9 - 10.3 mg/dL 7.5(L) 7.8(L) 7.5(L)   Hepatic Function Latest  Ref Rng & Units 11/25/2019 11/24/2019 06/22/2019  Total Protein 6.5 - 8.1 g/dL 7.5 7.3 7.1  Albumin 3.5 - 5.0 g/dL 3.7 4.1 3.9(L)  AST 15 - 41 U/L _0 ALT 0 - 44 U/L 19 17 -  Alk Phosphatase 38 - 126 U/L 46 56 51  Total Bilirubin 0.3 - 1.2 mg/dL 0.5 0.3 0.2  Bilirubin, Direct 0.0 - 0.3 mg/dL - - -   CBC Latest Ref Rng & Units 12/02/2019 11/30/2019 11/29/2019  WBC 4.0 - 10.5 K/uL 9.5 9.7 8.6  Hemoglobin 13.0 - 17.0 g/dL 9.5(L) 11.4(L) 10.1(L)  Hematocrit 39 - 52 % 31.1(L) 36.6(L) 33.1(L)  Platelets 150 - 400 K/uL 257 302 247   Lipid Panel     Component Value Date/Time   CHOL 173 12/02/2019 0600   CHOL 221 (H) 12/12/2018 1038   TRIG 267 (H) 12/02/2019 0600   HDL 25 (L) 12/02/2019 0600   HDL 35 (L) 12/12/2018 1038   CHOLHDL 6.9 12/02/2019 0600   VLDL 53 (H) 12/02/2019 0600   LDLCALC 95 12/02/2019 0600   LDLCALC 138 (H) 12/12/2018 1038   Cardiac Panel (last 3 results) No results  for input(s): CKTOTAL, CKMB, TROPONINI, RELINDX in the last 72 hours.  HEMOGLOBIN A1C Lab Results  Component Value Date   HGBA1C 5.6 11/24/2019   HGBA1C 5.6 11/24/2019   HGBA1C 5.6 (A) 11/24/2019   HGBA1C 5.6 11/24/2019   TSH Recent Labs    12/12/18 1038  TSH 2.730   Imaging: No results found.  Cardiac Studies:  EKG: 11/30/2019: Normal sinus rhythm with rate of 96 bpm, LVH with repolarization abnormality, cannot exclude inferior lateral ischemia.  Echocardiogram 11/30/2019:  1. Left ventricular ejection fraction, by estimation, is 60 to 65%. The left ventricle has normal function. The left ventricle has no regional wall motion abnormalities. There is severe left ventricular wall thickening. Wall thickness is 2.5 cm average,  concerning for infiltrative cardiomyopathy such as amyloidosis. Impaired diastolic function with indeterminate LV filling pressure.  2. The aortic valve is tricuspid. Aortic valve regurgitation is not visualized.  3. The pericardial effusion is localized near the right  ventricle and circumferential.  4. The inferior vena cava is dilated in size with >50% respiratory variability, suggesting right atrial pressure of 8 mmHg.  5. Right ventricular systolic function is normal. The right ventricular size is normal. There is normal pulmonary artery systolic pressure. The estimated right ventricular systolic pressure is 38.1 mmHg.  6. The mitral valve is grossly normal. Trivial mitral valve regurgitation.   Scheduled Meds: . sodium chloride   Intravenous Once  . amLODipine  10 mg Oral Daily  . calcitRIOL  0.25 mcg Oral Daily  . calcium carbonate  1 tablet Oral TID  . carvedilol  6.25 mg Oral BID WC  . diclofenac Sodium  2 g Topical QID  . docusate sodium  100 mg Oral BID  . heparin injection (subcutaneous)  5,000 Units Subcutaneous Q8H  . hydrALAZINE  100 mg Oral Q8H  . pantoprazole  40 mg Oral BID AC  . sodium bicarbonate  650 mg Oral TID  . sodium chloride flush  3 mL Intravenous Q12H  . sodium chloride flush  3 mL Intravenous Q12H  . sodium zirconium cyclosilicate  10 g Oral BID   Continuous Infusions: . sodium chloride 250 mL (11/26/19 1228)  . ferric gluconate (FERRLECIT/NULECIT) IV 250 mg (11/26/19 1228)   PRN Meds:.sodium chloride, acetaminophen **OR** acetaminophen, camphor-menthol, metoCLOPramide (REGLAN) injection, sodium chloride flush, sorbitol  Assessment/Plan:  1.  Atypical chest pain, at least this morning the chest pain appears to be musculoskeletal.  Patient is presently undergoing nuclear stress test. 2.  End-stage renal disease, presently not on dialysis 3.  Hypertension with hypertensive heart and chronic kidney disease stage V 4.  Morbid obesity  Recommendation: We will await nuclear stress test report.  Unless high risk, would recommend medical therapy, patient not on appropriate medical therapy previously, has not been compliant with medications.  He now appears much motivated and states that his current state of health is related  to ignoring medical advice in the past.  He also appears to be motivated for weight loss as well.  We will continue to follow and make recommendations once the nuclear stress test report is available.   Adrian Prows, M.D. 12/02/2019, 2:34 PM [facility] Pager: [phone] Office: [phone] If no answer: [phone]

## 2019-12-02 NOTE — Progress Notes (Signed)
Nutrition Education Note  RD consulted for Renal Education.   Case discussed with RN, who reports that pt is down for procedure currently and unavailable. Plan to hold off on HD start in hospital, however, will require HD start soon. RN reports that pt with complex medical and social situation and will likely require a lot of support, education, and reinforcement.   Per [facility] team notes, pt for possible discharge today. Noted discharge orders in chart.   Provided "Food Pyramid for Healthy Eating with Kidney Disease" to patient/family. Attached to AVS/ discharge summary.   Current diet order is renal with 1.2 L fluid restricton, patient is consuming approximately 100% of meals at this time. Labs and medications reviewed. No further nutrition interventions warranted at this time. RD contact information provided. If additional nutrition issues arise, please re-consult RD.  Loistine Chance, RD, LDN, [place] Registered Dietitian II Certified Diabetes Care and Education Specialist Please refer to [employer] for RD and/or RD on-call/weekend/after hours pager

## 2019-12-04 ENCOUNTER — Telehealth: Payer: Self-pay

## 2019-12-04 NOTE — Telephone Encounter (Signed)
-----   Message from [name], [place] sent at 11/25/2019  1:58 PM EDT ----- Regarding: fax labs Please fax yesterday's labs to [facility]. Attention Dr. Madelon Lips. Fax is [phone]  [name]  APRN, MSN, FNP-C [facility] [address] [place],  [address] [phone]

## 2019-12-04 NOTE — Telephone Encounter (Signed)
Faxed/confirmed labs to [facility]

## 2019-12-07 ENCOUNTER — Other Ambulatory Visit: Payer: Self-pay

## 2019-12-07 ENCOUNTER — Emergency Department (HOSPITAL_COMMUNITY): Payer: Medicaid Other

## 2019-12-07 ENCOUNTER — Telehealth: Payer: Self-pay

## 2019-12-07 ENCOUNTER — Encounter (HOSPITAL_COMMUNITY): Payer: Self-pay

## 2019-12-07 ENCOUNTER — Inpatient Hospital Stay (HOSPITAL_COMMUNITY)
Admission: EM | Admit: 2019-12-07 | Discharge: 2019-12-14 | DRG: 673 | Disposition: A | Discharge status: Home / Self Care | Payer: Medicaid Other | Attending: Internal Medicine | Admitting: Internal Medicine

## 2019-12-07 DIAGNOSIS — Z20822 Contact with and (suspected) exposure to covid-19: Secondary | ICD-10-CM | POA: Diagnosis present

## 2019-12-07 DIAGNOSIS — I160 Hypertensive urgency: Secondary | ICD-10-CM | POA: Diagnosis present

## 2019-12-07 DIAGNOSIS — K5900 Constipation, unspecified: Secondary | ICD-10-CM | POA: Diagnosis present

## 2019-12-07 DIAGNOSIS — I1311 Hypertensive heart and chronic kidney disease without heart failure, with stage 5 chronic kidney disease, or end stage renal disease: Secondary | ICD-10-CM | POA: Diagnosis present

## 2019-12-07 DIAGNOSIS — M109 Gout, unspecified: Secondary | ICD-10-CM | POA: Diagnosis present

## 2019-12-07 DIAGNOSIS — N179 Acute kidney failure, unspecified: Secondary | ICD-10-CM | POA: Diagnosis present

## 2019-12-07 DIAGNOSIS — Z992 Dependence on renal dialysis: Secondary | ICD-10-CM | POA: Diagnosis not present

## 2019-12-07 DIAGNOSIS — N186 End stage renal disease: Secondary | ICD-10-CM | POA: Diagnosis present

## 2019-12-07 DIAGNOSIS — I161 Hypertensive emergency: Secondary | ICD-10-CM | POA: Diagnosis present

## 2019-12-07 DIAGNOSIS — I429 Cardiomyopathy, unspecified: Secondary | ICD-10-CM | POA: Diagnosis present

## 2019-12-07 DIAGNOSIS — N185 Chronic kidney disease, stage 5: Secondary | ICD-10-CM

## 2019-12-07 DIAGNOSIS — R1084 Generalized abdominal pain: Secondary | ICD-10-CM | POA: Diagnosis present

## 2019-12-07 DIAGNOSIS — D509 Iron deficiency anemia, unspecified: Secondary | ICD-10-CM | POA: Diagnosis not present

## 2019-12-07 DIAGNOSIS — G4733 Obstructive sleep apnea (adult) (pediatric): Secondary | ICD-10-CM | POA: Diagnosis present

## 2019-12-07 DIAGNOSIS — Z7982 Long term (current) use of aspirin: Secondary | ICD-10-CM

## 2019-12-07 DIAGNOSIS — E782 Mixed hyperlipidemia: Secondary | ICD-10-CM | POA: Diagnosis present

## 2019-12-07 DIAGNOSIS — Q612 Polycystic kidney, adult type: Secondary | ICD-10-CM

## 2019-12-07 DIAGNOSIS — R202 Paresthesia of skin: Secondary | ICD-10-CM | POA: Diagnosis present

## 2019-12-07 DIAGNOSIS — N19 Unspecified kidney failure: Secondary | ICD-10-CM | POA: Diagnosis not present

## 2019-12-07 DIAGNOSIS — F329 Major depressive disorder, single episode, unspecified: Secondary | ICD-10-CM | POA: Diagnosis present

## 2019-12-07 DIAGNOSIS — E875 Hyperkalemia: Secondary | ICD-10-CM | POA: Diagnosis present

## 2019-12-07 DIAGNOSIS — Z79899 Other long term (current) drug therapy: Secondary | ICD-10-CM

## 2019-12-07 DIAGNOSIS — E559 Vitamin D deficiency, unspecified: Secondary | ICD-10-CM | POA: Diagnosis present

## 2019-12-07 DIAGNOSIS — Z9989 Dependence on other enabling machines and devices: Secondary | ICD-10-CM | POA: Diagnosis not present

## 2019-12-07 DIAGNOSIS — R778 Other specified abnormalities of plasma proteins: Secondary | ICD-10-CM | POA: Diagnosis present

## 2019-12-07 DIAGNOSIS — D631 Anemia in chronic kidney disease: Secondary | ICD-10-CM | POA: Diagnosis present

## 2019-12-07 DIAGNOSIS — Z87891 Personal history of nicotine dependence: Secondary | ICD-10-CM | POA: Diagnosis not present

## 2019-12-07 DIAGNOSIS — R079 Chest pain, unspecified: Secondary | ICD-10-CM | POA: Diagnosis present

## 2019-12-07 DIAGNOSIS — I10 Essential (primary) hypertension: Secondary | ICD-10-CM | POA: Diagnosis present

## 2019-12-07 DIAGNOSIS — F419 Anxiety disorder, unspecified: Secondary | ICD-10-CM | POA: Diagnosis present

## 2019-12-07 DIAGNOSIS — E872 Acidosis: Secondary | ICD-10-CM | POA: Diagnosis present

## 2019-12-07 DIAGNOSIS — R7989 Other specified abnormal findings of blood chemistry: Secondary | ICD-10-CM | POA: Diagnosis not present

## 2019-12-07 DIAGNOSIS — R072 Precordial pain: Secondary | ICD-10-CM | POA: Diagnosis present

## 2019-12-07 DIAGNOSIS — E871 Hypo-osmolality and hyponatremia: Secondary | ICD-10-CM | POA: Diagnosis present

## 2019-12-07 DIAGNOSIS — N2581 Secondary hyperparathyroidism of renal origin: Secondary | ICD-10-CM | POA: Diagnosis present

## 2019-12-07 DIAGNOSIS — E8729 Other acidosis: Secondary | ICD-10-CM

## 2019-12-07 DIAGNOSIS — Z9119 Patient's noncompliance with other medical treatment and regimen: Secondary | ICD-10-CM

## 2019-12-07 DIAGNOSIS — E669 Obesity, unspecified: Secondary | ICD-10-CM | POA: Diagnosis present

## 2019-12-07 DIAGNOSIS — Z6835 Body mass index (BMI) 35.0-35.9, adult: Secondary | ICD-10-CM

## 2019-12-07 LAB — URINALYSIS, ROUTINE W REFLEX MICROSCOPIC
Bilirubin Urine: NEGATIVE
Glucose, UA: NEGATIVE mg/dL
Ketones, ur: NEGATIVE mg/dL
Nitrite: NEGATIVE
Protein, ur: 100 mg/dL — AB
RBC / HPF: >50 RBC/hpf — ABNORMAL HIGH (ref 0–5)
Specific Gravity, Urine: 1.01 (ref 1.005–1.030)
WBC, UA: >50 WBC/hpf — ABNORMAL HIGH (ref 0–5)
pH: 6 (ref 5.0–8.0)

## 2019-12-07 LAB — TROPONIN I (HIGH SENSITIVITY)
Troponin I (High Sensitivity): 40 ng/L — ABNORMAL HIGH (ref <18)
Troponin I (High Sensitivity): 34 ng/L — ABNORMAL HIGH (ref <18)

## 2019-12-07 LAB — BASIC METABOLIC PANEL
Anion gap: 20 — ABNORMAL HIGH (ref 5–15)
BUN: 94 mg/dL — ABNORMAL HIGH (ref 6–20)
CO2: 17 mmol/L — ABNORMAL LOW (ref 22–32)
Calcium: 8.4 mg/dL — ABNORMAL LOW (ref 8.9–10.3)
Chloride: 97 mmol/L — ABNORMAL LOW (ref 98–111)
Creatinine, Ser: 8.15 mg/dL — ABNORMAL HIGH (ref 0.61–1.24)
GFR calc Af Amer: 8 mL/min — ABNORMAL LOW (ref >60)
GFR calc non Af Amer: 7 mL/min — ABNORMAL LOW (ref >60)
Glucose, Bld: 99 mg/dL (ref 70–99)
Potassium: 4.2 mmol/L (ref 3.5–5.1)
Sodium: 134 mmol/L — ABNORMAL LOW (ref 135–145)

## 2019-12-07 LAB — CBC
HCT: 32.9 % — ABNORMAL LOW (ref 39.0–52.0)
Hemoglobin: 10.2 g/dL — ABNORMAL LOW (ref 13.0–17.0)
MCH: 27.3 pg (ref 26.0–34.0)
MCHC: 31 g/dL (ref 30.0–36.0)
MCV: 88 fL (ref 80.0–100.0)
Platelets: 282 10*3/uL (ref 150–400)
RBC: 3.74 MIL/uL — ABNORMAL LOW (ref 4.22–5.81)
RDW: 15 % (ref 11.5–15.5)
WBC: 9.4 10*3/uL (ref 4.0–10.5)
nRBC: 0 % (ref 0.0–0.2)

## 2019-12-07 LAB — HEPATIC FUNCTION PANEL
ALT: 15 U/L (ref 0–44)
AST: 16 U/L (ref 15–41)
Albumin: 4.2 g/dL (ref 3.5–5.0)
Alkaline Phosphatase: 41 U/L (ref 38–126)
Bilirubin, Direct: 0.1 mg/dL (ref 0.0–0.2)
Indirect Bilirubin: 0.6 mg/dL (ref 0.3–0.9)
Total Bilirubin: 0.7 mg/dL (ref 0.3–1.2)
Total Protein: 8.1 g/dL (ref 6.5–8.1)

## 2019-12-07 LAB — SARS CORONAVIRUS 2 BY RT PCR (HOSPITAL ORDER, PERFORMED IN ~~LOC~~ HOSPITAL LAB): SARS Coronavirus 2: NEGATIVE

## 2019-12-07 LAB — LIPASE, BLOOD: Lipase: 48 U/L (ref 11–51)

## 2019-12-07 MED ORDER — CALCITRIOL 0.25 MCG PO CAPS
0.2500 ug | ORAL_CAPSULE | Freq: Every day | ORAL | Status: DC
  Administered 2019-12-07 – 2019-12-12 (×5): 0.25 ug via ORAL
  Filled 2019-12-07 (×6): qty 1

## 2019-12-07 MED ORDER — ACETAMINOPHEN 325 MG PO TABS
650.0000 mg | ORAL_TABLET | Freq: Four times a day (QID) | ORAL | Status: DC | PRN
  Administered 2019-12-07 – 2019-12-09 (×5): 650 mg via ORAL
  Filled 2019-12-07 (×6): qty 2

## 2019-12-07 MED ORDER — ONDANSETRON HCL 4 MG/2ML IJ SOLN
4.0000 mg | Freq: Once | INTRAMUSCULAR | Status: AC
  Administered 2019-12-07: 4 mg via INTRAVENOUS
  Filled 2019-12-07: qty 2

## 2019-12-07 MED ORDER — HYDRALAZINE HCL 25 MG PO TABS
100.0000 mg | ORAL_TABLET | Freq: Once | ORAL | Status: AC
  Administered 2019-12-07: 100 mg via ORAL
  Filled 2019-12-07: qty 4

## 2019-12-07 MED ORDER — SODIUM ZIRCONIUM CYCLOSILICATE 10 G PO PACK
10.0000 g | PACK | Freq: Two times a day (BID) | ORAL | Status: DC
  Administered 2019-12-07 – 2019-12-08 (×2): 10 g via ORAL
  Filled 2019-12-07 (×2): qty 1

## 2019-12-07 MED ORDER — ADULT MULTIVITAMIN W/MINERALS CH
1.0000 | ORAL_TABLET | Freq: Every day | ORAL | Status: DC
  Administered 2019-12-07 – 2019-12-14 (×6): 1 via ORAL
  Filled 2019-12-07 (×6): qty 1

## 2019-12-07 MED ORDER — DOCUSATE SODIUM 100 MG PO CAPS
100.0000 mg | ORAL_CAPSULE | Freq: Two times a day (BID) | ORAL | Status: DC
  Administered 2019-12-07 – 2019-12-14 (×12): 100 mg via ORAL
  Filled 2019-12-07 (×12): qty 1

## 2019-12-07 MED ORDER — ISOSORBIDE DINITRATE 10 MG PO TABS
20.0000 mg | ORAL_TABLET | Freq: Three times a day (TID) | ORAL | Status: DC
  Administered 2019-12-07 – 2019-12-14 (×14): 20 mg via ORAL
  Filled 2019-12-07 (×3): qty 2
  Filled 2019-12-07: qty 1
  Filled 2019-12-07 (×6): qty 2
  Filled 2019-12-07: qty 1
  Filled 2019-12-07: qty 2
  Filled 2019-12-07: qty 1
  Filled 2019-12-07 (×2): qty 2

## 2019-12-07 MED ORDER — SODIUM BICARBONATE 650 MG PO TABS
650.0000 mg | ORAL_TABLET | Freq: Three times a day (TID) | ORAL | Status: DC
  Administered 2019-12-07 – 2019-12-08 (×4): 650 mg via ORAL
  Filled 2019-12-07 (×6): qty 1

## 2019-12-07 MED ORDER — CARVEDILOL 12.5 MG PO TABS
12.5000 mg | ORAL_TABLET | Freq: Two times a day (BID) | ORAL | Status: DC
  Administered 2019-12-08 (×2): 12.5 mg via ORAL
  Filled 2019-12-07 (×2): qty 1

## 2019-12-07 MED ORDER — AMLODIPINE BESYLATE 10 MG PO TABS
10.0000 mg | ORAL_TABLET | Freq: Every day | ORAL | Status: DC
  Administered 2019-12-07 – 2019-12-14 (×6): 10 mg via ORAL
  Filled 2019-12-07: qty 2
  Filled 2019-12-07 (×3): qty 1
  Filled 2019-12-07: qty 2
  Filled 2019-12-07: qty 1

## 2019-12-07 MED ORDER — METOPROLOL TARTRATE 5 MG/5ML IV SOLN
5.0000 mg | Freq: Four times a day (QID) | INTRAVENOUS | Status: DC | PRN
  Filled 2019-12-07: qty 5

## 2019-12-07 MED ORDER — CALCIUM CARBONATE ANTACID 500 MG PO CHEW
1.0000 | CHEWABLE_TABLET | Freq: Three times a day (TID) | ORAL | Status: DC
  Administered 2019-12-07 – 2019-12-14 (×17): 200 mg via ORAL
  Filled 2019-12-07 (×17): qty 1

## 2019-12-07 MED ORDER — ROSUVASTATIN CALCIUM 5 MG PO TABS
10.0000 mg | ORAL_TABLET | Freq: Every day | ORAL | Status: DC
  Administered 2019-12-07 – 2019-12-14 (×6): 10 mg via ORAL
  Filled 2019-12-07: qty 2
  Filled 2019-12-07: qty 1
  Filled 2019-12-07 (×2): qty 2
  Filled 2019-12-07: qty 1
  Filled 2019-12-07: qty 2

## 2019-12-07 MED ORDER — POLYETHYLENE GLYCOL 3350 17 G PO PACK
17.0000 g | PACK | Freq: Every day | ORAL | Status: DC | PRN
  Administered 2019-12-12 – 2019-12-13 (×2): 17 g via ORAL
  Filled 2019-12-07 (×2): qty 1

## 2019-12-07 MED ORDER — SODIUM CHLORIDE 0.9% FLUSH
3.0000 mL | Freq: Two times a day (BID) | INTRAVENOUS | Status: DC
  Administered 2019-12-07 – 2019-12-08 (×2): 3 mL via INTRAVENOUS

## 2019-12-07 MED ORDER — ASPIRIN EC 81 MG PO TBEC
81.0000 mg | DELAYED_RELEASE_TABLET | Freq: Every day | ORAL | Status: DC
  Administered 2019-12-07 – 2019-12-14 (×6): 81 mg via ORAL
  Filled 2019-12-07 (×6): qty 1

## 2019-12-07 MED ORDER — HYDRALAZINE HCL 50 MG PO TABS
100.0000 mg | ORAL_TABLET | Freq: Three times a day (TID) | ORAL | Status: DC
  Administered 2019-12-07 – 2019-12-13 (×12): 100 mg via ORAL
  Filled 2019-12-07 (×13): qty 2

## 2019-12-07 MED ORDER — ACETAMINOPHEN 650 MG RE SUPP: 650.0000 mg | Freq: Four times a day (QID) | RECTAL | Status: DC | PRN

## 2019-12-07 MED ORDER — HEPARIN SODIUM (PORCINE) 5000 UNIT/ML IJ SOLN
5000.0000 [IU] | Freq: Three times a day (TID) | INTRAMUSCULAR | Status: DC
  Administered 2019-12-07 – 2019-12-08 (×2): 5000 [IU] via SUBCUTANEOUS
  Filled 2019-12-07 (×2): qty 1

## 2019-12-07 MED ORDER — AMLODIPINE BESYLATE 5 MG PO TABS
10.0000 mg | ORAL_TABLET | Freq: Once | ORAL | Status: AC
  Administered 2019-12-07: 10 mg via ORAL
  Filled 2019-12-07: qty 2

## 2019-12-07 MED ORDER — CHLORHEXIDINE GLUCONATE CLOTH 2 % EX PADS
6.0000 | MEDICATED_PAD | Freq: Every day | CUTANEOUS | Status: DC
  Administered 2019-12-10 – 2019-12-11 (×2): 6 via TOPICAL

## 2019-12-07 MED ORDER — BISACODYL 10 MG RE SUPP
10.0000 mg | Freq: Once | RECTAL | Status: AC
  Administered 2019-12-07: 10 mg via RECTAL
  Filled 2019-12-07: qty 1

## 2019-12-07 MED ORDER — CARVEDILOL 12.5 MG PO TABS
12.5000 mg | ORAL_TABLET | Freq: Once | ORAL | Status: AC
  Administered 2019-12-07: 12.5 mg via ORAL
  Filled 2019-12-07: qty 1

## 2019-12-07 MED ORDER — NITROGLYCERIN 0.4 MG SL SUBL: 0.4000 mg | SUBLINGUAL_TABLET | SUBLINGUAL | Status: DC | PRN

## 2019-12-07 NOTE — Consult Note (Signed)
Renal Service Consult Note [facility]  Michael [name] 12/07/2019 Michael [name] Requesting Physician:  Dr Michael [name].   Reason for Consult:  Pt w/ CKD V and recurrent  HPI: The patient is a 44 y.o. year-old w/ anxiety / depression, advancing CKD V w/ creat 4.2 in April this year, up to 6.5 in September when admitted about 1.5 wks ago for HTN urgency and a/c renal failure . Seen by his renal MD Dr Michael [name] who felt he didn't require HD that admission but did suspect he would need HD within 1-2 mos. Pt was dc'd on 9/15 > CP work-up showed abnormal stress test, hyperkalemia rx'd medically. Pt reported SI and was seen by psychiatry and cleared by them.  Hospital stay was prolonged by recurrent N/V. Nuc stress study showed lower EF of 45%. Cardiology suggested medical Rx and added isordil, asa and a statin. Patient returns now 5 days later w/ chest pain, SOB , nausea, abd pain. Creat was 7.5 - 8 here on recent admit, today creat is 8.15 , BUN 94.  Asked to see for CKD V.    Pt seen in room.  Pt having nausea , occ emesis.  I d/w Dr Michael [name] who recommends proceeding w/ HD now given persistent uremic features and also issues w/ noncompliance pt has displayed.  He is R handed.   ROS  denies CP  no joint pain   no HA  no blurry vision  no rash  no dysuria  no difficulty voiding  no change in urine color    Past Medical History  Past Medical History:  Diagnosis Date  . CKD (chronic kidney disease)   . Hypertension   . OSA (obstructive sleep apnea)   . Polycystic kidney disease   . Vitamin D deficiency 11/2018   Past Surgical History History reviewed. No pertinent surgical history. Family History  Family History  Problem Relation Age of Onset  . Bipolar disorder Father    Social History  reports that he has quit smoking. He has never used smokeless tobacco. He reports current alcohol use. He reports that he does not use drugs. Allergies No Known Allergies Home medications Prior  to Admission medications   Medication Sig Start Date End Date Taking? Authorizing Provider  acetaminophen (TYLENOL) 650 MG CR tablet Take 650 mg by mouth daily.    Yes [provider]  amLODipine (NORVASC) 10 MG tablet Take 1 tablet (10 mg total) by mouth daily. 11/28/19  Yes Michael Art, DO  calcitRIOL (ROCALTROL) 0.25 MCG capsule Take 1 capsule (0.25 mcg total) by mouth daily. 11/28/19  Yes Michael Canary U, DO  calcium carbonate (TUMS - DOSED IN MG ELEMENTAL CALCIUM) 500 MG chewable tablet Chew 1 tablet (200 mg of elemental calcium total) by mouth 3 (three) times daily. 12/02/19  Yes Michael Brooking, MD  camphor-menthol [facility]) lotion Apply topically as needed for itching. 12/02/19  Yes Michael Brooking, MD  carvedilol (COREG) 12.5 MG tablet Take 1 tablet (12.5 mg total) by mouth 2 (two) times daily with a meal. 12/02/19  Yes Michael Brooking, MD  hydrALAZINE (APRESOLINE) 100 MG tablet Take 1 tablet (100 mg total) by mouth every 8 (eight) hours. 11/27/19  Yes Michael Canary U, DO  isosorbide dinitrate (ISORDIL) 20 MG tablet Take 1 tablet (20 mg total) by mouth 3 (three) times daily. 12/02/19  Yes Michael Brooking, MD  multivitamin (ONE-A-DAY MEN'S) TABS tablet Take 1 tablet by mouth daily. 03/21/18  Yes  Michael Boast, FNP  nitroGLYCERIN (NITROSTAT) 0.4 MG SL tablet Place 1 tablet (0.4 mg total) under the tongue every 5 (five) minutes as needed for chest pain. 12/02/19 12/01/20 Yes Michael Cota, MD  rosuvastatin (CRESTOR) 10 MG tablet Take 1 tablet (10 mg total) by mouth daily. 12/02/19 12/01/20 Yes Michael Cota, MD  sodium bicarbonate 650 MG tablet Take 1 tablet (650 mg total) by mouth 3 (three) times daily. 11/27/19  Yes Michael Bear U, DO  sodium zirconium cyclosilicate (LOKELMA) 10 g PACK packet Take 10 g by mouth 2 (two) times daily. 11/27/19  Yes Michael Girt, DO  allopurinol (ZYLOPRIM) 100 MG tablet Take 1 tablet (100 mg total) by mouth 2 (two) times daily. Patient not  taking: Reported on 12/07/2019 11/24/19   Michael Dew, FNP  aspirin EC 81 MG tablet Take 1 tablet (81 mg total) by mouth daily. Swallow whole. Patient not taking: Reported on 12/07/2019 12/02/19 12/01/20  Michael Cota, MD     Vitals:   12/07/19 1000 12/07/19 1100 12/07/19 1130 12/07/19 1200  BP: (!) 115/99 (!) 145/83 (!) 157/93 (!) 147/74  Pulse: 76 79 82 79  Resp: _0 Temp:      TempSrc:      SpO2: 97% 96% 97% 95%   Exam Gen alert, WD WN AAM, no distress No rash, cyanosis or gangrene Sclera anicteric, throat clear  No jvd or bruits Chest clear bilat to bases no rales or wheezing RRR no MRG Abd soft ntnd no mass or ascites +bs GU normal male  MS no joint effusions or deformity Ext no leg or UE edema, no wounds or ulcers Neuro is alert, Ox 3 , nf NO asterixis    Home meds:  - norvasc 10/ coreg 12.5 bid/ hydralazine 100 tid/ isordil 20 tid/ sl ntg prn/ crestor 10 qd/ asa 81 qd  - zyloprim 100 bid/ sod bicarb 650 tid  - lokelma 10 gm bid  - prn's/ vitamins/ supplements    Nuclear Med 12/02/19 > IMPRESSION: 1. Moderate size region of reversible ischemia in the mid segment lateral wall. 2. Global hypokinesia and LEFT ventricular dilatation.  3. Left ventricular ejection fraction 42% 4. Non invasive risk stratification*: Intermediate     Na 134  K 4.2  CO2 17  BUN 94  Cr 8.15  eGFR 8  Alb 4.2  LFT's ok    WBC 9.4  Hb 10.2      UA hazy , large LE/ Hb, 100 prot, >50 rbc/ wbc, rare bact     CXR - IMPRESSION: No active cardiopulmonary disease.        Assessment/ Plan: 1. CKD V/ polycystic kidney disease - w/ suspected uremia (e.g,  Nausea) which was prominent on last admit also.  Missed outpt visit this week w/ his nephrologist.  Pt has poor compliance. Creat up to 8's.   Recommend that we go ahead and start dialysis this admission given hx of noncompliance, and current uremia symptoms, in order to make sure he does not have a more serious problem such as  life-threatening electrolyte changes. Consulting IR for [facility], will need perm access as well prior to dc. Does not have insurance will consult SW.   2. Chest pain - has nuclear stress last week , + for ischemia, EF 45%.  3. HTN - cont bp meds 4. Vol - no sig vol excess, BP's are controlled 5. Anemia ckd - this is probably CKD related. Hb 10,  hold esa for now.       Kelly Splinter  MD 12/07/2019, 12:53 PM  Recent Labs  Lab 12/02/19 0600 12/07/19 0640  WBC 9.5 9.4  HGB 9.5* 10.2*   Recent Labs  Lab 12/02/19 0600 12/07/19 0640  K 4.6 4.2  BUN 75* 94*  CREATININE 7.98* 8.15*  CALCIUM 7.5* 8.4*

## 2019-12-07 NOTE — Telephone Encounter (Signed)
-----   Message from [name], [place] sent at 11/25/2019  1:58 PM EDT ----- Regarding: fax labs Please fax yesterday's labs to [facility] [place]. Attention Dr. Madelon Lips. Fax is [phone]  [name]  APRN, MSN, FNP-C [facility] [address] [place], [place] [address] [phone]

## 2019-12-07 NOTE — ED Triage Notes (Signed)
Patient arrived with complaints of fatigue, central chest pain, shortness of breath, abdominal pain, and tingling in his fingers and feet. States he was just admitted for a hypertensive emergency and is feeling the same symptoms.

## 2019-12-07 NOTE — ED Provider Notes (Signed)
[facility] DEPT Provider Note   CSN: [id number] Arrival date & time: 12/07/19  4650     History Chief Complaint  Patient presents with  . Chest Pain  . Shortness of Breath    Michael [name] is a 44 y.o. male.  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 Hollie Salk, hypertension, sleep apnea who presents to the emergency department with a multitude of complaints.  Patient states that about 3 to 4 hours ago he woke up with constant, worsening, central, moderate, aching chest pain.  Also notes some mild shortness of breath.  Patient complains of moderate diffuse abdominal pain with nausea and vomiting as well as some paresthesias in the hands and feet.  Also notes constipation.  No fevers, chills, diarrhea, urinary changes, syncope.  Per records, patient was admitted on September 8 for AKI, generalized weakness, hyperkalemia.  He was then discharged on September 15 in stable condition.  Nephrology anticipates patient will need to start dialysis within the next 2 months.  Recommended that he continue taking Lokelma twice daily. Pt had a f/u appointment on 9/16 which he missed.    Echocardiogram 11/30/2019:  1. Left ventricular ejection fraction, by estimation, is 60 to 65%. The left ventricle has normal function. The left ventricle has no regional wall motion abnormalities. There is severe left ventricular wall thickening. Wall thickness is 2.5 cm average,  concerning for infiltrative cardiomyopathy such as amyloidosis. Impaired diastolic function with indeterminate LV filling pressure.  2. The aortic valve is tricuspid. Aortic valve regurgitation is not visualized.  3. The pericardial effusion is localized near the right ventricle and circumferential.  4. The inferior vena cava is dilated in size with >50% respiratory variability, suggesting right atrial pressure of 8 mmHg.  5. Right ventricular systolic function is normal. The right  ventricular size is normal. There is normal pulmonary artery systolic pressure. The estimated right ventricular systolic pressure is 35.4 mmHg.  6. The mitral valve is grossly normal. Trivial mitral valve regurgitation.     Past Medical History:  Diagnosis Date  . CKD (chronic kidney disease)   . Hypertension   . OSA (obstructive sleep apnea)   . Polycystic kidney disease   . Vitamin D deficiency 11/2018    Patient Active Problem List   Diagnosis Date Noted  . CKD (chronic kidney disease), stage V ([place]) 12/02/2019  . Uremia 12/02/2019  . Chest pain 12/02/2019  . Elevated troponin 12/02/2019  . Anemia in CKD (chronic kidney disease) 12/02/2019  . Adjustment disorder with mixed anxiety and depressed mood 11/28/2019  . Renal failure 11/25/2019  . Hyperkalemia 11/25/2019  . Normocytic anemia 11/25/2019  . Metabolic acidosis   . Hematochezia   . Chronic gout due to renal impairment without tophus 12/14/2018  . Observed sleep apnea 09/01/2016  . Excessive daytime sleepiness 09/01/2016  . Primary snoring 09/01/2016  . Acute pyelonephritis 05/25/2016  . Acute gouty arthritis 05/25/2016  . Hematuria 05/25/2016  . Essential hypertension, benign 05/25/2016  . Morbid obesity ([place]) 05/25/2016  . Hypertensive crisis 05/23/2016  . CKD (chronic kidney disease) stage 3, GFR 30-59 ml/min 05/23/2016  . Adult polycystic kidney disease 05/23/2016  . SIRS (systemic inflammatory response syndrome) ([place]) 05/23/2016    History reviewed. No pertinent surgical history.    Family History  Problem Relation Age of Onset  . Bipolar disorder Father     Social History   Tobacco Use  . Smoking status: Former Research scientist (life sciences)  . Smokeless  tobacco: Never Used  Vaping Use  . Vaping Use: Never used  Substance Use Topics  . Alcohol use: Yes    Comment: wine occ   . Drug use: No    Home Medications Prior to Admission medications   Medication Sig Start Date End Date Taking? Authorizing Provider   allopurinol (ZYLOPRIM) 100 MG tablet Take 1 tablet (100 mg total) by mouth 2 (two) times daily. 11/24/19   [name], FNP  amLODipine (NORVASC) 10 MG tablet Take 1 tablet (10 mg total) by mouth daily. 11/28/19   Geradine Girt, DO  aspirin EC 81 MG tablet Take 1 tablet (81 mg total) by mouth daily. Swallow whole. 12/02/19 12/01/20  Samuella Cota, MD  calcitRIOL (ROCALTROL) 0.25 MCG capsule Take 1 capsule (0.25 mcg total) by mouth daily. 11/28/19   Geradine Girt, DO  calcium carbonate (TUMS - DOSED IN MG ELEMENTAL CALCIUM) 500 MG chewable tablet Chew 1 tablet (200 mg of elemental calcium total) by mouth 3 (three) times daily. 12/02/19   Samuella Cota, MD  camphor-menthol [facility]) lotion Apply topically as needed for itching. 12/02/19   Samuella Cota, MD  carvedilol (COREG) 12.5 MG tablet Take 1 tablet (12.5 mg total) by mouth 2 (two) times daily with a meal. 12/02/19   Samuella Cota, MD  hydrALAZINE (APRESOLINE) 100 MG tablet Take 1 tablet (100 mg total) by mouth every 8 (eight) hours. 11/27/19   Geradine Girt, DO  isosorbide dinitrate (ISORDIL) 20 MG tablet Take 1 tablet (20 mg total) by mouth 3 (three) times daily. 12/02/19   Samuella Cota, MD  multivitamin (ONE-A-DAY MEN'S) TABS tablet Take 1 tablet by mouth daily. 03/21/18   Lanae Boast, FNP  nitroGLYCERIN (NITROSTAT) 0.4 MG SL tablet Place 1 tablet (0.4 mg total) under the tongue every 5 (five) minutes as needed for chest pain. 12/02/19 12/01/20  Samuella Cota, MD  rosuvastatin (CRESTOR) 10 MG tablet Take 1 tablet (10 mg total) by mouth daily. 12/02/19 12/01/20  Samuella Cota, MD  sodium bicarbonate 650 MG tablet Take 1 tablet (650 mg total) by mouth 3 (three) times daily. 11/27/19   Geradine Girt, DO  sodium zirconium cyclosilicate (LOKELMA) 10 g PACK packet Take 10 g by mouth 2 (two) times daily. 11/27/19   Geradine Girt, DO    Allergies    Patient has no known allergies.  Review of Systems   Review of  Systems  All other systems reviewed and are negative. Ten systems reviewed and are negative for acute change, except as noted in the HPI.    Physical Exam Updated Vital Signs BP (!) 169/112 (BP Location: Left Arm)   Pulse 95   Temp 98.3 F (36.8 C) (Oral)   Resp (!) 98   SpO2 98%   Physical Exam Vitals and nursing note reviewed.  Constitutional:      General: He is in acute distress.     Appearance: Normal appearance. He is well-developed. He is obese. He is ill-appearing. He is not toxic-appearing or diaphoretic.  HENT:     Head: Normocephalic and atraumatic.     Right Ear: External ear normal.     Left Ear: External ear normal.     Nose: Nose normal.     Mouth/Throat:     Mouth: Mucous membranes are moist.     Pharynx: Oropharynx is clear. No oropharyngeal exudate or posterior oropharyngeal erythema.  Eyes:     Extraocular Movements: Extraocular movements  intact.     Pupils: Pupils are equal, round, and reactive to light.  Cardiovascular:     Rate and Rhythm: Normal rate and regular rhythm.     Pulses: Normal pulses.     Heart sounds: Normal heart sounds. Heart sounds not distant. No murmur heard.  No systolic murmur is present.  No friction rub. No gallop.   Pulmonary:     Effort: Pulmonary effort is normal. No tachypnea, accessory muscle usage or respiratory distress.     Breath sounds: Normal breath sounds. No stridor. No decreased breath sounds, wheezing, rhonchi or rales.     Comments: Lungs clear to auscultation bilaterally. Abdominal:     General: Abdomen is flat.     Palpations: Abdomen is soft.     Tenderness: There is abdominal tenderness.     Comments: Diffuse tenderness noted diffusely with deep palpation.  Musculoskeletal:        General: Normal range of motion.     Cervical back: Normal range of motion and neck supple. No tenderness.     Right lower leg: No tenderness. No edema.     Left lower leg: No tenderness. No edema.     Comments: No pedal edema.   Palpable pedal pulses.  Skin:    General: Skin is warm and dry.  Neurological:     General: No focal deficit present.     Mental Status: He is alert and oriented to person, place, and time.  Psychiatric:        Mood and Affect: Mood normal.        Behavior: Behavior normal.    ED Results / Procedures / Treatments   Labs (all labs ordered are listed, but only abnormal results are displayed) Labs Reviewed  BASIC METABOLIC PANEL - Abnormal; Notable for the following components:      Result Value   Sodium 134 (*)    Chloride 97 (*)    CO2 17 (*)    BUN 94 (*)    Creatinine, Ser 8.15 (*)    Calcium 8.4 (*)    GFR calc non Af Amer 7 (*)    GFR calc Af Amer 8 (*)    Anion gap 20 (*)    All other components within normal limits  CBC - Abnormal; Notable for the following components:   RBC 3.74 (*)    Hemoglobin 10.2 (*)    HCT 32.9 (*)    All other components within normal limits  TROPONIN I (HIGH SENSITIVITY) - Abnormal; Notable for the following components:   Troponin I (High Sensitivity) 40 (*)    All other components within normal limits  TROPONIN I (HIGH SENSITIVITY) - Abnormal; Notable for the following components:   Troponin I (High Sensitivity) 34 (*)    All other components within normal limits  SARS CORONAVIRUS 2 BY RT PCR (HOSPITAL ORDER, [facility])  HEPATIC FUNCTION PANEL  LIPASE, BLOOD  URINALYSIS, ROUTINE W REFLEX MICROSCOPIC   EKG None  Radiology DG Chest 2 View  Result Date: 12/07/2019 CLINICAL DATA:  Headache and chest pressure EXAM: CHEST - 2 VIEW COMPARISON:  05/15/2019 FINDINGS: Artifact from EKG leads. There is no edema, consolidation, effusion, or pneumothorax. Normal heart size and mediastinal contours. IMPRESSION: No active cardiopulmonary disease. Electronically Signed   By: Monte Fantasia M.D.   On: 12/07/2019 07:40   CT Head Wo Contrast  Result Date: 12/07/2019 CLINICAL DATA:  Headache and fatigue. Upper and lower  extremity tingling  EXAM: CT HEAD WITHOUT CONTRAST TECHNIQUE: Contiguous axial images were obtained from the base of the skull through the vertex without intravenous contrast. COMPARISON:  None. FINDINGS: Brain: Ventricles are normal in size and configuration. There is symmetric frontal atrophy bilaterally. Sulci elsewhere appear normal. There is no intracranial mass hemorrhage, extra-axial fluid collection, or midline shift. Brain parenchyma appears unremarkable without evident acute infarct. Vascular: No hyperdense vessel. No appreciable vascular calcification. Skull: Bony calvarium appears intact. Sinuses/Orbits: There is mucosal thickening in the posterior right maxillary antrum. There is slight mucosal thickening in several ethmoid air cells. Orbits appear symmetric bilaterally. Other: Mastoid air cells are clear. IMPRESSION: Mild symmetric frontal atrophy bilaterally. Ventricles and sulci otherwise appear normal. No mass or hemorrhage evident. No findings suggesting acute infarct. Mucosal thickening in several paranasal sinus regions noted. Electronically Signed   By: Lowella Grip III M.D.   On: 12/07/2019 09:31   DG Abd 2 Views  Result Date: 12/07/2019 CLINICAL DATA:  Constipation, N/V today EXAM: ABDOMEN - 2 VIEW COMPARISON:  11/27/2019 FINDINGS: Visualized lung bases clear. No free air. Normal bowel gas pattern. No abnormal abdominal calcifications. Regional bones unremarkable. IMPRESSION: Negative. Electronically Signed   By: Lucrezia Europe M.D.   On: 12/07/2019 08:15    Procedures Procedures   Medications Ordered in ED Medications  ondansetron (ZOFRAN) injection 4 mg (4 mg Intravenous Given 12/07/19 0830)  bisacodyl (DULCOLAX) suppository 10 mg (10 mg Rectal Given 12/07/19 0831)  hydrALAZINE (APRESOLINE) tablet 100 mg (100 mg Oral Given 12/07/19 0929)  carvedilol (COREG) tablet 12.5 mg (12.5 mg Oral Given 12/07/19 0929)  amLODipine (NORVASC) tablet 10 mg (10 mg Oral Given 12/07/19 9622)   ED  Course  I have reviewed the triage vital signs and the nursing notes.  Pertinent labs & imaging results that were available during my care of the patient were reviewed by me and considered in my medical decision making (see chart for details).  Clinical Course as of Dec 07 951  Mon Dec 07, 2019  2979 Improved from 75, 6 days ago.  Troponin I (High Sensitivity)(!): 40 [LJ]  0749 Similar to values 1 week ago during his prior admission.  Creatinine(!): 8.15 [LJ]  0750 Negative  DG Chest 2 View [LJ]  0754 Anion gap(!): 20 [LJ]  0800 Pt has not taken his AM meds.   BP(!): 161/81 [LJ]  0825 Negative  DG Abd 2 Views [LJ]  0827 BUN(!): 94 [LJ]  0828 Spoke to Dr. Hollie Salk with nephrology. States that he missed his f/u appointment on 9/16. Recommends that we control his n/v and HTN. If we admit he will have to be transferred to [facility] for dialysis.    [LJ]  [id number] Troponin I (High Sensitivity)(!): 34 [LJ]  0935 Mild symmetric frontal atrophy bilaterally. Ventricles and sulci otherwise appear normal. No mass or hemorrhage evident. No findings suggesting acute infarct.  Mucosal thickening in several paranasal sinus regions noted.   [LJ]    Clinical Course User Index [LJ] Rayna Sexton, PA-C   MDM Rules/Calculators/A&P                          Pt is a 44 y.o. male that presents with a history, physical exam, and ED Clinical Course as noted above.   Pt presents today with multiple complaints. He has a h/o ADPKD. He was recently admitted and discharged on 9/15 with similar complaints.  He presents today with n/v/c as well as CP. He  was give zofran and a Dulcolax suppository.  He notes relief of his nausea.  X-rays were obtained of the chest and abdomen which were reassuring.  Patient began complaining of dizziness when being evaluated by my attending physician.  I obtained a CT scan of the head which shows mild symmetric frontal atrophy bilaterally.  No acute findings.  Patient noted to  be hypertensive throughout his stay today.  He has not taken his BP meds.  He was given Norvasc, hydralazine, carvedilol.  He was discussed with Dr. Hollie Salk with nephrology.  She notes that he missed his scheduled appointment on the 16th.  Patient was unaware that he had this appointment.  At his recent discharge they noted that he would likely need dialysis within the next 2 months.  Hemoglobin stable around 10.2.  Elevated creatinine once again at 8.15.  [place] with a BUN of 94.  No elevation in lipase.  Troponin elevated at 40 with a delta of 34.  Stable.  Improved from prior values.  Dr. Hollie Salk discussed with nephrology at [facility] who recommend admission of the patient.  Will discuss with the hospitalist team for admission at [facility] so that patient may undergo inpatient dialysis.  COVID-19 test has been ordered.  Note: Portions of this report may have been transcribed using voice recognition software. Every effort was made to ensure accuracy; however, inadvertent computerized transcription errors may be present.   Final Clinical Impression(s) / ED Diagnoses Final diagnoses:  Renal failure, unspecified chronicity    Rx / DC Orders ED Discharge Orders    None       Rayna Sexton, PA-C 12/07/19 8546    Maudie Flakes, MD 12/07/19 [phone]

## 2019-12-07 NOTE — H&P (Signed)
History and Physical        Hospital Admission Note Date: 12/07/2019  Patient name: Michael [name] Medical record number: [id number] Date of birth: [birth year] Age: 44 y.o. Gender: male  PCP: [name], FNP  Patient coming from: Home Lives with: Alone At baseline, ambulates: Independently  Chief Complaint    Chief Complaint  Patient presents with  . Chest Pain  . Shortness of Breath      HPI:   This is a 44 year old male with past medical history of CKD 5 secondary to ADPKD followed by Dr. Hollie Salk, OSA, hypertension who was recently admitted from 9/8-9/15 due to hypertensive emergency and AKI on CKD 5 who presents today with multiple complaints including central chest pain which woke him up about 3 to 4 hours ago fatigue, shortness of breath, diffuse abdominal pain.  Also with nausea and vomiting, constipation, and paresthesias in the hands and feet.  Had similar symptoms at his last hospitalization.  At his last hospitalization nephrology anticipated he would need to start dialysis in the next 2 months and recommended Lokelma twice daily.  He had a follow-up appointment on 9/16 which he had missed as he was unaware he had an appointment.  Of note his last hospitalization he had an echo which showed EF 60-65% and had concerning features of infiltrative process and cardiology was consulted at the time.  Plan was for him to have a nuclear stress test which showed an intermediate risk.  ED Course: Afebrile, hypertensive to 174/106, satting well on room air.  Notable labs: Sodium 134, CO2 17, BUN 94, creatinine 8.15 (previous 7.98 on 9/15), AG 20, troponin 40-> 34, Hb 10.2.  Imaging overall unremarkable.  He was given Zofran and a Dulcolax suppository with improvement in nausea, Norvasc, hydralazine and carvedilol.  Nephrology was consulted who recommended admission to [facility] as he  may undergo inpatient dialysis.  Vitals:   12/07/19 1000 12/07/19 1100  BP: (!) 115/99 (!) 145/83  Pulse: 76 79  Resp: 16 16  Temp:    SpO2: 97% 96%     Review of Systems:  Review of Systems  Constitutional: Positive for malaise/fatigue. Negative for chills and fever.  HENT: Negative.   Eyes: Negative.   Respiratory: Positive for shortness of breath. Negative for wheezing.   Cardiovascular: Positive for chest pain. Negative for palpitations and leg swelling.  Gastrointestinal: Positive for abdominal pain. Negative for blood in stool.  Genitourinary:       Making urine  Musculoskeletal: Negative for falls and joint pain.  Neurological: Positive for weakness.  All other systems reviewed and are negative.   Medical/Social/Family History   Past Medical History: Past Medical History:  Diagnosis Date  . CKD (chronic kidney disease)   . Hypertension   . OSA (obstructive sleep apnea)   . Polycystic kidney disease   . Vitamin D deficiency 11/2018    History reviewed. No pertinent surgical history.  Medications: Prior to Admission medications   Medication Sig Start Date End Date Taking? Authorizing Provider  allopurinol (ZYLOPRIM) 100 MG tablet Take 1 tablet (100 mg total) by mouth 2 (two) times daily. 11/24/19   [name], FNP  amLODipine (NORVASC) 10 MG tablet Take  1 tablet (10 mg total) by mouth daily. 11/28/19   Geradine Girt, DO  aspirin EC 81 MG tablet Take 1 tablet (81 mg total) by mouth daily. Swallow whole. 12/02/19 12/01/20  Samuella Cota, MD  calcitRIOL (ROCALTROL) 0.25 MCG capsule Take 1 capsule (0.25 mcg total) by mouth daily. 11/28/19   Geradine Girt, DO  calcium carbonate (TUMS - DOSED IN MG ELEMENTAL CALCIUM) 500 MG chewable tablet Chew 1 tablet (200 mg of elemental calcium total) by mouth 3 (three) times daily. 12/02/19   Samuella Cota, MD  camphor-menthol [facility]) lotion Apply topically as needed for itching. 12/02/19   Samuella Cota, MD    carvedilol (COREG) 12.5 MG tablet Take 1 tablet (12.5 mg total) by mouth 2 (two) times daily with a meal. 12/02/19   Samuella Cota, MD  hydrALAZINE (APRESOLINE) 100 MG tablet Take 1 tablet (100 mg total) by mouth every 8 (eight) hours. 11/27/19   Geradine Girt, DO  isosorbide dinitrate (ISORDIL) 20 MG tablet Take 1 tablet (20 mg total) by mouth 3 (three) times daily. 12/02/19   Samuella Cota, MD  multivitamin (ONE-A-DAY MEN'S) TABS tablet Take 1 tablet by mouth daily. 03/21/18   Lanae Boast, FNP  nitroGLYCERIN (NITROSTAT) 0.4 MG SL tablet Place 1 tablet (0.4 mg total) under the tongue every 5 (five) minutes as needed for chest pain. 12/02/19 12/01/20  Samuella Cota, MD  rosuvastatin (CRESTOR) 10 MG tablet Take 1 tablet (10 mg total) by mouth daily. 12/02/19 12/01/20  Samuella Cota, MD  sodium bicarbonate 650 MG tablet Take 1 tablet (650 mg total) by mouth 3 (three) times daily. 11/27/19   Geradine Girt, DO  sodium zirconium cyclosilicate (LOKELMA) 10 g PACK packet Take 10 g by mouth 2 (two) times daily. 11/27/19   Geradine Girt, DO    Allergies:  No Known Allergies  Social History:  reports that he has quit smoking. He has never used smokeless tobacco. He reports current alcohol use. He reports that he does not use drugs.  Family History: Family History  Problem Relation Age of Onset  . Bipolar disorder Father      Objective   Physical Exam: Blood pressure (!) 145/83, pulse 79, temperature 98.8 F (37.1 C), temperature source Oral, resp. rate 16, SpO2 96 %.  Physical Exam Vitals and nursing note reviewed.  Constitutional:      Appearance: Normal appearance.     Comments: Appears uncomfortable  HENT:     Head: Normocephalic and atraumatic.  Eyes:     Conjunctiva/sclera: Conjunctivae normal.  Cardiovascular:     Rate and Rhythm: Normal rate and regular rhythm.     Heart sounds: Murmur heard.  Systolic murmur is present with a grade of 2/6.   Pulmonary:      Effort: Pulmonary effort is normal.     Breath sounds: Normal breath sounds.  Abdominal:     General: Abdomen is flat.     Palpations: Abdomen is soft.     Tenderness: There is abdominal tenderness.  Musculoskeletal:        General: No swelling or tenderness.     Comments: Hand grip intact bilaterally Muscle strength intact diffusely Reproducible chest pain over the sternum  Skin:    Coloration: Skin is not jaundiced or pale.  Neurological:     General: No focal deficit present.     Mental Status: He is alert. Mental status is at baseline.     Motor:  No weakness.  Psychiatric:        Mood and Affect: Mood normal.        Behavior: Behavior normal.     LABS on Admission: I have personally reviewed all the labs and imaging below    Basic Metabolic Panel: Recent Labs  Lab 12/02/19 0600 12/07/19 0640  NA 138 134*  K 4.6 4.2  CL 103 97*  CO2 22 17*  GLUCOSE 91 99  BUN 75* 94*  CREATININE 7.98* 8.15*  CALCIUM 7.5* 8.4*   Liver Function Tests: Recent Labs  Lab 12/07/19 0640  AST 16  ALT 15  ALKPHOS 41  BILITOT 0.7  PROT 8.1  ALBUMIN 4.2   Recent Labs  Lab 12/07/19 0640  LIPASE 48   No results for input(s): AMMONIA in the last 168 hours. CBC: Recent Labs  Lab 12/02/19 0600 12/02/19 0600 12/07/19 0640  WBC 9.5  --  9.4  HGB 9.5*  --  10.2*  HCT 31.1*  --  32.9*  MCV 86.1   < > 88.0  PLT 257  --  282   < > = values in this interval not displayed.   Cardiac Enzymes: No results for input(s): CKTOTAL, CKMB, CKMBINDEX, TROPONINI in the last 168 hours. BNP: Invalid input(s): POCBNP CBG: No results for input(s): GLUCAP in the last 168 hours.  Radiological Exams on Admission:  DG Chest 2 View  Result Date: 12/07/2019 CLINICAL DATA:  Headache and chest pressure EXAM: CHEST - 2 VIEW COMPARISON:  05/15/2019 FINDINGS: Artifact from EKG leads. There is no edema, consolidation, effusion, or pneumothorax. Normal heart size and mediastinal contours. IMPRESSION:  No active cardiopulmonary disease. Electronically Signed   By: Monte Fantasia M.D.   On: 12/07/2019 07:40   CT Head Wo Contrast  Result Date: 12/07/2019 CLINICAL DATA:  Headache and fatigue. Upper and lower extremity tingling EXAM: CT HEAD WITHOUT CONTRAST TECHNIQUE: Contiguous axial images were obtained from the base of the skull through the vertex without intravenous contrast. COMPARISON:  None. FINDINGS: Brain: Ventricles are normal in size and configuration. There is symmetric frontal atrophy bilaterally. Sulci elsewhere appear normal. There is no intracranial mass hemorrhage, extra-axial fluid collection, or midline shift. Brain parenchyma appears unremarkable without evident acute infarct. Vascular: No hyperdense vessel. No appreciable vascular calcification. Skull: Bony calvarium appears intact. Sinuses/Orbits: There is mucosal thickening in the posterior right maxillary antrum. There is slight mucosal thickening in several ethmoid air cells. Orbits appear symmetric bilaterally. Other: Mastoid air cells are clear. IMPRESSION: Mild symmetric frontal atrophy bilaterally. Ventricles and sulci otherwise appear normal. No mass or hemorrhage evident. No findings suggesting acute infarct. Mucosal thickening in several paranasal sinus regions noted. Electronically Signed   By: Lowella Grip III M.D.   On: 12/07/2019 09:31   DG Abd 2 Views  Result Date: 12/07/2019 CLINICAL DATA:  Constipation, N/V today EXAM: ABDOMEN - 2 VIEW COMPARISON:  11/27/2019 FINDINGS: Visualized lung bases clear. No free air. Normal bowel gas pattern. No abnormal abdominal calcifications. Regional bones unremarkable. IMPRESSION: Negative. Electronically Signed   By: Lucrezia Europe M.D.   On: 12/07/2019 08:15      EKG: Independently reviewed.  At baseline   A & P   Principal Problem:   AKI (acute kidney injury) ([place]) Active Problems:   Adult polycystic kidney disease   Essential hypertension, benign   Metabolic  acidosis, increased anion gap   CKD (chronic kidney disease), stage V (HCC)   Uremia   Chest pain   1.  AKI on CKD 5 with history of ADPKD a. Creatinine continues to trend up, currently 8.15 and previously 7.98 on 9/15 b. Transfer to [facility] to start dialysis, n.p.o. after midnight c. Nephrology on board, appreciate further recommendations  2. Atypical chest pain a. Reproducible sternal chest pain with flat troponin and EKG at baseline suggesting musculoskeletal etiology b. Had an intermediate risk stress test a few days ago at [facility] c. Echo 9/13: EF 60 to 65% with concern for infiltrative cardiomyopathy such as amyloidosis. d. Continue Imdur, carvedilol, Crestor and aspirin e. Telemetry f. Dr. [name], cardiology, consulted  3. Anion gap metabolic acidosis a. Could be from uremia and/or AKI b. Nephrology on board, appreciate recommendations  4. Abdominal pain unknown etiology possibly from constipation and/or acidosis a. Abdominal x-ray unremarkable b. Added on laxatives  5. Bilateral hand and foot paresthesias, unknown etiology a. Neurologically intact b. Could be from acidosis c. Continue to monitor  6. Hypertension a. Continue amlodipine, carvedilol, hydralazine  7. Hyperlipidemia a. Continue Crestor  8. OSA a. CPAP nightly   DVT prophylaxis: Heparin   Code Status: Prior  Diet: Renal diet, n.p.o. after midnight Family Communication: Admission, patients condition and plan of care including tests being ordered have been discussed with the patient who indicates understanding and agrees with the plan and Code Status. Disposition Plan: The appropriate patient status for this patient is INPATIENT. Inpatient status is judged to be reasonable and necessary in order to provide the required intensity of service to ensure the patient's safety. The patient's presenting symptoms, physical exam findings, and initial radiographic and laboratory data in the context of their  chronic comorbidities is felt to place them at high risk for further clinical deterioration. Furthermore, it is not anticipated that the patient will be medically stable for discharge from the hospital within 2 midnights of admission. The following factors support the patient status of inpatient.   " The patient's presenting symptoms include bilateral hand and foot paresthesias, abdominal pain, chest pain. " The worrisome physical exam findings include abdominal pain and reproducible chest pain " The initial radiographic and laboratory data are worrisome because of renal function, anion gap metabolic acidosis. " The chronic co-morbidities include CKD 5, ADPKD, hypertension.   * I certify that at the point of admission it is my clinical judgment that the patient will require inpatient hospital care spanning beyond 2 midnights from the point of admission due to high intensity of service, high risk for further deterioration and high frequency of surveillance required.*   Status is: Inpatient  Remains inpatient appropriate because:Inpatient level of care appropriate due to severity of illness   Dispo: The patient is from: Home              Anticipated d/c is to: Home              Anticipated d/c date is: 3 days              Patient currently is not medically stable to d/c.        The medical decision making on this patient was of high complexity and the patient is at high risk for clinical deterioration, therefore this is a level 3  admission.  Consultants  . Nephrology . Cardiology  Procedures  . None  Time Spent on Admission: 76 minutes    Harold Hedge, DO [facility] Pager [phone] 12/07/2019, 11:58 AM

## 2019-12-07 NOTE — TOC Initial Note (Signed)
Transition of Care [facility]) - Initial/Assessment Note    Patient Details  Name: Michael [name] MRN: [id number] Date of Birth: 06-25-1975  Transition of Care [facility]) CM/SW Contact:    Erenest Rasher, RN Phone Number: [phone] 12/07/2019, 6:53 PM  Clinical Narrative:                 TOC CM spoke to pt and states he is going to reach out to his family to let them know about dialysis. But currently his family does not know. States he is sole provider in his home and has monthly bills that are coming due soon. He was scheduled to start a new job next week. Currently does not have income. States he has started he has met with Development worker, community to discuss Medicaid and disability. Pt may need to see Chaplain. Will send referral for Chaplain for Spiritual support.   Expected Discharge Plan: Home/Self Care     Patient Goals and CMS Choice        Expected Discharge Plan and Services Expected Discharge Plan: Home/Self Care In-house Referral: Clinical Social Work Discharge Planning Services: CM Consult   Living arrangements for the past 2 months: Apartment Expected Discharge Date:  (unknown)                                    Prior Living Arrangements/Services Living arrangements for the past 2 months: Apartment Lives with:: Self Patient language and need for interpreter reviewed:: Yes Do you feel safe going back to the place where you live?: Yes      Need for Family Participation in Patient Care: No (Comment) Care giver support system in place?: No (comment)   Criminal Activity/Legal Involvement Pertinent to Current Situation/Hospitalization: No - Comment as needed  Activities of Daily Living Home Assistive Devices/Equipment: CPAP ADL Screening (condition at time of admission) Patient's cognitive ability adequate to safely complete daily activities?: Yes Is the patient deaf or have difficulty hearing?: No Does the patient have difficulty seeing, even when wearing  glasses/contacts?: No Does the patient have difficulty concentrating, remembering, or making decisions?: No Patient able to express need for assistance with ADLs?: Yes Does the patient have difficulty dressing or bathing?: No Independently performs ADLs?: Yes (appropriate for developmental age) Does the patient have difficulty walking or climbing stairs?: Yes (secondary to shortness of breath along with numbness and tingling in feet) Weakness of Legs: Both (numbness and tingling in feet) Weakness of Arms/Hands: None  Permission Sought/Granted Permission sought to share information with : Case Manager, PCP, Family Supports Permission granted to share information with : Yes, Verbal Permission Granted  Share Information with NAME: Silvino Selman     Permission granted to share info w Relationship: brother  Permission granted to share info w Contact Information: [phone]  Emotional Assessment   Attitude/Demeanor/Rapport: Other (comment) (nervous) Affect (typically observed): Anxious, Overwhelmed Orientation: : Oriented to Self, Oriented to Place, Oriented to  Time, Oriented to Situation   Psych Involvement: No (comment)  Admission diagnosis:  AKI (acute kidney injury) ([place]) [N17.9] Patient Active Problem List   Diagnosis Date Noted  . AKI (acute kidney injury) ([place]) 12/07/2019  . CKD (chronic kidney disease), stage V ([place]) 12/02/2019  . Uremia 12/02/2019  . Chest pain 12/02/2019  . Elevated troponin 12/02/2019  . Anemia in CKD (chronic kidney disease) 12/02/2019  . Adjustment disorder with mixed anxiety and depressed mood 11/28/2019  .  Renal failure 11/25/2019  . Hyperkalemia 11/25/2019  . Normocytic anemia 11/25/2019  . Metabolic acidosis, increased anion gap   . Hematochezia   . Chronic gout due to renal impairment without tophus 12/14/2018  . Observed sleep apnea 09/01/2016  . Excessive daytime sleepiness 09/01/2016  . Primary snoring 09/01/2016  . Acute pyelonephritis  05/25/2016  . Acute gouty arthritis 05/25/2016  . Hematuria 05/25/2016  . Essential hypertension, benign 05/25/2016  . Morbid obesity ([place]) 05/25/2016  . Hypertensive crisis 05/23/2016  . CKD (chronic kidney disease) stage 3, GFR 30-59 ml/min 05/23/2016  . Adult polycystic kidney disease 05/23/2016  . SIRS (systemic inflammatory response syndrome) ([place]) 05/23/2016   PCP:  [name], FNP Pharmacy:   [facility], [address] [place] [address] Phone: [phone] Fax: [place], [place] - [address] [place] [address] Phone: [phone] Fax: [phone]     Social Determinants of Health (SDOH) Interventions    Readmission Risk Interventions No flowsheet data found.

## 2019-12-07 NOTE — Telephone Encounter (Signed)
Labs faxed and confirmed to [facility]

## 2019-12-08 ENCOUNTER — Encounter (HOSPITAL_COMMUNITY): Payer: Self-pay | Admitting: Internal Medicine

## 2019-12-08 DIAGNOSIS — R778 Other specified abnormalities of plasma proteins: Secondary | ICD-10-CM

## 2019-12-08 DIAGNOSIS — Z9989 Dependence on other enabling machines and devices: Secondary | ICD-10-CM

## 2019-12-08 DIAGNOSIS — D509 Iron deficiency anemia, unspecified: Secondary | ICD-10-CM

## 2019-12-08 DIAGNOSIS — R7989 Other specified abnormal findings of blood chemistry: Secondary | ICD-10-CM

## 2019-12-08 DIAGNOSIS — G4733 Obstructive sleep apnea (adult) (pediatric): Secondary | ICD-10-CM

## 2019-12-08 DIAGNOSIS — I161 Hypertensive emergency: Secondary | ICD-10-CM

## 2019-12-08 LAB — COMPREHENSIVE METABOLIC PANEL
ALT: 12 U/L (ref 0–44)
AST: 13 U/L — ABNORMAL LOW (ref 15–41)
Albumin: 3.7 g/dL (ref 3.5–5.0)
Alkaline Phosphatase: 36 U/L — ABNORMAL LOW (ref 38–126)
Anion gap: 18 — ABNORMAL HIGH (ref 5–15)
BUN: 94 mg/dL — ABNORMAL HIGH (ref 6–20)
CO2: 20 mmol/L — ABNORMAL LOW (ref 22–32)
Calcium: 8.2 mg/dL — ABNORMAL LOW (ref 8.9–10.3)
Chloride: 104 mmol/L (ref 98–111)
Creatinine, Ser: 8.27 mg/dL — ABNORMAL HIGH (ref 0.61–1.24)
GFR calc Af Amer: 8 mL/min — ABNORMAL LOW (ref >60)
GFR calc non Af Amer: 7 mL/min — ABNORMAL LOW (ref >60)
Glucose, Bld: 88 mg/dL (ref 70–99)
Potassium: 4.5 mmol/L (ref 3.5–5.1)
Sodium: 142 mmol/L (ref 135–145)
Total Bilirubin: 0.4 mg/dL (ref 0.3–1.2)
Total Protein: 7.4 g/dL (ref 6.5–8.1)

## 2019-12-08 LAB — CBC
HCT: 29.1 % — ABNORMAL LOW (ref 39.0–52.0)
Hemoglobin: 9 g/dL — ABNORMAL LOW (ref 13.0–17.0)
MCH: 27.3 pg (ref 26.0–34.0)
MCHC: 30.9 g/dL (ref 30.0–36.0)
MCV: 88.2 fL (ref 80.0–100.0)
Platelets: 267 10*3/uL (ref 150–400)
RBC: 3.3 MIL/uL — ABNORMAL LOW (ref 4.22–5.81)
RDW: 15.2 % (ref 11.5–15.5)
WBC: 8.2 10*3/uL (ref 4.0–10.5)
nRBC: 0 % (ref 0.0–0.2)

## 2019-12-08 LAB — HEPATITIS B CORE ANTIBODY, IGM: Hep B C IgM: NONREACTIVE

## 2019-12-08 LAB — HEPATITIS B SURFACE ANTIGEN: Hepatitis B Surface Ag: NONREACTIVE

## 2019-12-08 LAB — PROTIME-INR
INR: 1.3 — ABNORMAL HIGH (ref 0.8–1.2)
Prothrombin Time: 15.3 seconds — ABNORMAL HIGH (ref 11.4–15.2)

## 2019-12-08 LAB — HEPATITIS B SURFACE ANTIBODY,QUALITATIVE: Hep B S Ab: NONREACTIVE

## 2019-12-08 MED ORDER — CARVEDILOL 25 MG PO TABS
25.0000 mg | ORAL_TABLET | Freq: Two times a day (BID) | ORAL | Status: DC
  Administered 2019-12-09 – 2019-12-14 (×5): 25 mg via ORAL
  Filled 2019-12-08 (×4): qty 1
  Filled 2019-12-08: qty 2

## 2019-12-08 MED ORDER — ASPIRIN 81 MG PO CHEW
81.0000 mg | CHEWABLE_TABLET | ORAL | Status: AC
  Administered 2019-12-09: 81 mg via ORAL
  Filled 2019-12-08: qty 1

## 2019-12-08 MED ORDER — SODIUM CHLORIDE 0.9 % IV SOLN: INTRAVENOUS | Status: DC

## 2019-12-08 MED ORDER — SODIUM CHLORIDE 0.9% FLUSH
3.0000 mL | Freq: Two times a day (BID) | INTRAVENOUS | Status: DC
  Administered 2019-12-08: 3 mL via INTRAVENOUS

## 2019-12-08 MED ORDER — SODIUM CHLORIDE 0.9 % IV SOLN: 250.0000 mL | INTRAVENOUS | Status: DC | PRN

## 2019-12-08 MED ORDER — HEPARIN SODIUM (PORCINE) 5000 UNIT/ML IJ SOLN
5000.0000 [IU] | Freq: Three times a day (TID) | INTRAMUSCULAR | Status: DC
  Administered 2019-12-10 – 2019-12-13 (×8): 5000 [IU] via SUBCUTANEOUS
  Filled 2019-12-08 (×8): qty 1

## 2019-12-08 MED ORDER — SODIUM CHLORIDE 0.9% FLUSH: 3.0000 mL | INTRAVENOUS | Status: DC | PRN

## 2019-12-08 MED ORDER — CEFAZOLIN SODIUM-DEXTROSE 2-4 GM/100ML-% IV SOLN: 2.0000 g | INTRAVENOUS | Status: DC

## 2019-12-08 NOTE — Progress Notes (Signed)
Patient placed on CPAP using FFM and room air.  Patient is familiar with equipment and procedure and can self-administer therapy.  6 cm20 used as per patient home settings.

## 2019-12-08 NOTE — Progress Notes (Signed)
[facility] Progress Note  Subjective: seen in room, doing much better today in terms of anxiety and acceptance of dialysis. Says he got info about disability and medicaid which was very encouraging.   Vitals:   12/08/19 1100 12/08/19 1130 12/08/19 1221 12/08/19 1222  BP: (!) 178/91 (!) 179/74 (!) 150/104   Pulse:   96   Resp: (!) 22 17 15    Temp:    98.6 F (37 C)  TempSrc:    Oral  SpO2:   99%     Exam: Gen alert, WD WN AAM, no distress No rash, cyanosis or gangrene Sclera anicteric, throat clear  No jvd or bruits Chest clear bilat to bases no rales or wheezing RRR no MRG Abd soft ntnd no mass or ascites +bs GU normal male  MS no joint effusions or deformity Ext no leg or UE edema, no wounds or ulcers Neuro is alert, Ox 3 , nf NO asterixis    Home meds:  - norvasc 10/ coreg 12.5 bid/ hydralazine 100 tid/ isordil 20 tid/ sl ntg prn/ crestor 10 qd/ asa 81 qd  - zyloprim 100 bid/ sod bicarb 650 tid  - lokelma 10 gm bid  - prn's/ vitamins/ supplements    Nuclear Med 12/02/19 > IMPRESSION: 1. Moderate size region of reversible ischemia in the mid segment lateral wall. 2. Global hypokinesia and LEFT ventricular dilatation.  3. Left ventricular ejection fraction 42% 4. Non invasive risk stratification*: Intermediate     Na 134  K 4.2  CO2 17  BUN 94  Cr 8.15  eGFR 8  Alb 4.2  LFT's ok    WBC 9.4  Hb 10.2      UA hazy , large LE/ Hb, 100 prot, >50 rbc/ wbc, rare bact     CXR - IMPRESSION: No active cardiopulmonary disease.        Assessment/ Plan: 1. CKD V/ polycystic kidney disease - w/ suspected uremia and progressive azotemia. Needs to start on dialysis. Per IR they will place HD cath tomorrow am , plan 1st HD tomorrow. Will call [facility] for permanent access.  2. Chest pain - has nuclear stress last week , + for ischemia, EF 45%.  3. HTN/ vol - getting all 3 home bp meds here, BP's still up, pull 1-2 L w/ hd tomorrow if tolerates 4. Anemia ckd - this is  probably CKD related. Hb 10, hold esa for now.        Rob Yaqueline Gutter 12/08/2019, 3:24 PM   Recent Labs  Lab 12/07/19 0640 12/08/19 0454  K 4.2 4.5  BUN 94* 94*  CREATININE 8.15* 8.27*  CALCIUM 8.4* 8.2*  HGB 10.2* 9.0*   Inpatient medications: . amLODipine  10 mg Oral Daily  . aspirin EC  81 mg Oral Daily  . calcitRIOL  0.25 mcg Oral Daily  . calcium carbonate  1 tablet Oral TID  . carvedilol  12.5 mg Oral BID WC  . Chlorhexidine Gluconate Cloth  6 each Topical Q0600  . docusate sodium  100 mg Oral BID  . [START ON 12/10/2019] heparin  5,000 Units Subcutaneous Q8H  . hydrALAZINE  100 mg Oral Q8H  . isosorbide dinitrate  20 mg Oral TID  . multivitamin with minerals  1 tablet Oral Daily  . rosuvastatin  10 mg Oral Daily  . sodium bicarbonate  650 mg Oral TID  . sodium chloride flush  3 mL Intravenous Q12H  . sodium zirconium cyclosilicate  10 g Oral BID   . [  START ON 12/09/2019]  ceFAZolin (ANCEF) IV     acetaminophen **OR** acetaminophen, metoprolol tartrate, nitroGLYCERIN, polyethylene glycol

## 2019-12-08 NOTE — ED Notes (Signed)
Attempted to call report. Charge RN will receive the patient but currently in an emergency. Receiving RN will call back when able to accept report.

## 2019-12-08 NOTE — Progress Notes (Signed)
PROGRESS NOTE  Michael [name] FIE:[id number] DOB: 07/14/1975   PCP: [name], FNP  Patient is from: Home  DOA: 12/07/2019 LOS: 1  Brief Narrative / Interim history: 44 year old male with history of CKD-5 secondary to ADPKD followed by Dr. Hollie Salk, OSA on CPAP, HTN, recent hospitalization from 9/8-9/15 for hypertensive emergency and AKI on CKD-5 presenting with multiple complaints including chest pain, shortness of breath, diffuse abdominal pain, nausea, vomiting, constipation, paresthesia in hands and feet. The plan was to follow-up with nephrology and start HD in about 2 months but he never followed up. Echo at that time with EF of 60 to 65% and concern for infiltrative process. Cardiology consulted. He had a nuclear stress test that showed unchanged risk, and cardiology recommended medical management.  In ED, afebrile. BP 174/106. On room air. Na 134. CO2 17. AG 20. Cr 8.15 (7.98 on 09/15). BUN 94. High-sensitivity troponin 40> 34. Hgb 10.2. Two view CXR and CTH without acute finding. Nephrology consulted. Plan is to start HD after HD cath placement.   Subjective: Seen and examined earlier this morning. No major events overnight of this morning. Reports headache that he describes as tightness over frontal areas. Reports getting headache when he takes BP meds. He also did not take his CPAP last night. Denies vision change, chest pain, shortness of breath, GI or UTI symptoms. Anxious about HD.   Objective: Vitals:   12/08/19 0626 12/08/19 0630 12/08/19 0744 12/08/19 1033  BP: (!) 168/108 (!) 206/104 (!) 162/87 (!) 167/87  Pulse:  95 81 89  Resp:  17 17 14   Temp:   98.2 F (36.8 C)   TempSrc:   Oral   SpO2:  97% 96% 97%    Intake/Output Summary (Last 24 hours) at 12/08/2019 1044 Last data filed at 12/08/2019 0744 Gross per 24 hour  Intake --  Output 1200 ml  Net -1200 ml   There were no vitals filed for this visit.  Examination:  GENERAL: No apparent distress.   Nontoxic. HEENT: MMM.  Vision and hearing grossly intact.  NECK: Supple.  No apparent JVD.  RESP: On room air. No IWOB.  Fair aeration bilaterally. CVS:  RRR. Heart sounds normal.  ABD/GI/GU: BS+. Abd soft, NTND.  MSK/EXT:  Moves extremities. No apparent deformity. No edema.  SKIN: no apparent skin lesion or wound NEURO: Awake, alert and oriented appropriately. CN, motor strength, light sensation and patellar reflex grossly intact.  PSYCH: Calm. Normal affect.   Procedures:  None  Microbiology summarized: COVID-19 PCR negative.  Assessment & Plan: AKI on CKD-five with azotemia and patient with history of ADPKD-likely due to uncontrolled hypertension:  Anion gap metabolic acidosis-likely due to renal failure. Improved. Hyponatremia-likely due to renal failure. Resolved. -Not uremic. K4.5. -Nephrology following-plan for HD cath by IR and HD -Optimize BP control.  Atypical chest pain/elevated troponin: Likely demand ischemia from uncontrolled hypertension. EKG without acute ischemic finding. Patient had outpatient stress test recently that was intermediate risk. Chest pain resolved. -Cardiology, Dr. [name] consulted by admitting provider.  Possible infiltrative cardiomyopathy/possible secondary to PHTN: Echo in 9/13 with EF of 60 to 65%,  2.30mm LV wall thickness concerning for infiltrative process and RVSP to 34. Does not appear fluid overloaded. No cardiopulmonary symptoms this morning. -HD for fluid management -Outpatient follow-up with cardiology, Dr. [name]  Uncontrolled hypertension/hypertensive emergency: Multifactorial including noncompliance, underlying sleep apnea and renal failure. BP improved. -Continue current regimens -Defer further adjustment to cardiology and nephrology  Iron deficiency anemia: Slight drop in  hemoglobin but about baseline. Iron sat 9% on recent iron panel on 9/9. -Monitor H&H -IV iron and Aranesp per nephrology  Abdominal pain: Due to constipation?  Uremia? Resolved this morning. Abdominal exam reassuring. -Bowel regimen  Bilateral hand and foot paresthesia-seems to have resolved. Neuro exam reassuring.  Hyperlipidemia -Continue statin  OSA on CPAP -Nightly CPAP  Obesity: BMI 34.73 -Encourage lifestyle change to lose weight.  There is no height or weight on file to calculate BMI.         DVT prophylaxis:  heparin injection 5,000 Units Start: 12/07/19 2200  Code Status: Full code Family Communication: Patient to update family. Seems he hasn't disclosed about his renal status to his family yet.  Status is: Inpatient  Remains inpatient appropriate because:Hemodynamically unstable, Ongoing diagnostic testing needed not appropriate for outpatient work up, IV treatments appropriate due to intensity of illness or inability to take PO and Inpatient level of care appropriate due to severity of illness   Dispo: The patient is from: Home              Anticipated d/c is to: Home              Anticipated d/c date is: 3 days              Patient currently is not medically stable to d/c.       Consultants:  Nephrology   Sch Meds:  Scheduled Meds: . amLODipine  10 mg Oral Daily  . aspirin EC  81 mg Oral Daily  . calcitRIOL  0.25 mcg Oral Daily  . calcium carbonate  1 tablet Oral TID  . carvedilol  12.5 mg Oral BID WC  . Chlorhexidine Gluconate Cloth  6 each Topical Q0600  . docusate sodium  100 mg Oral BID  . heparin  5,000 Units Subcutaneous Q8H  . hydrALAZINE  100 mg Oral Q8H  . isosorbide dinitrate  20 mg Oral TID  . multivitamin with minerals  1 tablet Oral Daily  . rosuvastatin  10 mg Oral Daily  . sodium bicarbonate  650 mg Oral TID  . sodium chloride flush  3 mL Intravenous Q12H  . sodium zirconium cyclosilicate  10 g Oral BID   Continuous Infusions: PRN Meds:.acetaminophen **OR** acetaminophen, metoprolol tartrate, nitroGLYCERIN, polyethylene glycol  Antimicrobials: Anti-infectives (From admission,  onward)   None       I have personally reviewed the following labs and images: CBC: Recent Labs  Lab 12/02/19 0600 12/07/19 0640 12/08/19 0454  WBC 9.5 9.4 8.2  HGB 9.5* 10.2* 9.0*  HCT 31.1* 32.9* 29.1*  MCV 86.1 88.0 88.2  PLT 257 282 267   BMP &GFR Recent Labs  Lab 12/02/19 0600 12/07/19 0640 12/08/19 0454  NA 138 134* 142  K 4.6 4.2 4.5  CL 103 97* 104  CO2 22 17* 20*  GLUCOSE 91 99 88  BUN 75* 94* 94*  CREATININE 7.98* 8.15* 8.27*  CALCIUM 7.5* 8.4* 8.2*   Estimated Creatinine Clearance: 15.4 mL/min (A) (by C-G formula based on SCr of 8.27 mg/dL (H)). Liver & Pancreas: Recent Labs  Lab 12/07/19 0640 12/08/19 0454  AST 16 13*  ALT 15 12  ALKPHOS 41 36*  BILITOT 0.7 0.4  PROT 8.1 7.4  ALBUMIN 4.2 3.7   Recent Labs  Lab 12/07/19 0640  LIPASE 48   No results for input(s): AMMONIA in the last 168 hours. Diabetic: No results for input(s): HGBA1C in the last 72 hours. No results for input(s):  GLUCAP in the last 168 hours. Cardiac Enzymes: No results for input(s): CKTOTAL, CKMB, CKMBINDEX, TROPONINI in the last 168 hours. No results for input(s): PROBNP in the last 8760 hours. Coagulation Profile: Recent Labs  Lab 12/08/19 0454  INR 1.3*   Thyroid Function Tests: No results for input(s): TSH, T4TOTAL, FREET4, T3FREE, THYROIDAB in the last 72 hours. Lipid Profile: No results for input(s): CHOL, HDL, LDLCALC, TRIG, CHOLHDL, LDLDIRECT in the last 72 hours. Anemia Panel: No results for input(s): VITAMINB12, FOLATE, FERRITIN, TIBC, IRON, RETICCTPCT in the last 72 hours. Urine analysis:    Component Value Date/Time   COLORURINE YELLOW 12/07/2019 1043   APPEARANCEUR HAZY (A) 12/07/2019 1043   LABSPEC 1.010 12/07/2019 1043   PHURINE 6.0 12/07/2019 1043   GLUCOSEU NEGATIVE 12/07/2019 1043   HGBUR LARGE (A) 12/07/2019 1043   BILIRUBINUR NEGATIVE 12/07/2019 1043   BILIRUBINUR neg 05/26/2019 1135   KETONESUR NEGATIVE 12/07/2019 1043   PROTEINUR 100  (A) 12/07/2019 1043   UROBILINOGEN 0.2 05/26/2019 1135   UROBILINOGEN 0.2 06/05/2017 1434   NITRITE NEGATIVE 12/07/2019 1043   LEUKOCYTESUR LARGE (A) 12/07/2019 1043   Sepsis Labs: Invalid input(s): PROCALCITONIN, [place]  Microbiology: Recent Results (from the past 240 hour(s))  SARS Coronavirus 2 by RT PCR (hospital order, performed in [facility] hospital lab) Nasopharyngeal Nasopharyngeal Swab     Status: None   Collection Time: 12/07/19  9:47 AM   Specimen: Nasopharyngeal Swab  Result Value Ref Range Status   SARS Coronavirus 2 NEGATIVE NEGATIVE Final    Comment: (NOTE) SARS-CoV-2 target nucleic acids are NOT DETECTED.  The SARS-CoV-2 RNA is generally detectable in upper and lower respiratory specimens during the acute phase of infection. The lowest concentration of SARS-CoV-2 viral copies this assay can detect is 250 copies / mL. A negative result does not preclude SARS-CoV-2 infection and should not be used as the sole basis for treatment or other patient management decisions.  A negative result may occur with improper specimen collection / handling, submission of specimen other than nasopharyngeal swab, presence of viral mutation(s) within the areas targeted by this assay, and inadequate number of viral copies (<250 copies / mL). A negative result must be combined with clinical observations, patient history, and epidemiological information.  Fact Sheet for Patients:   StrictlyIdeas.no  Fact Sheet for Healthcare Providers: BankingDealers.co.za  This test is not yet approved or  cleared by the [place] FDA and has been authorized for detection and/or diagnosis of SARS-CoV-2 by FDA under an Emergency Use Authorization (EUA).  This EUA will remain in effect (meaning this test can be used) for the duration of the COVID-19 declaration under Section 564(b)(1) of the Act, 21 U.S.C. section 360bbb-3(b)(1), unless the  authorization is terminated or revoked sooner.  Performed at [facility], [place] [address], [place], [place] [address]     Radiology Studies: No results found.    [place] T. Dodge  If 7PM-7AM, please contact night-coverage [email or web address] 12/08/2019, 10:44 AM

## 2019-12-08 NOTE — ED Notes (Signed)
[facility] called, transportation arranged.

## 2019-12-08 NOTE — Progress Notes (Signed)
TOC CM received call from [facility] and they have completed pt's disability paperwork and will come to have him sign. They are aware of his emergency financial needs to assist with bills and food. [place], [place] ED TOC CM [phone]

## 2019-12-08 NOTE — ED Notes (Addendum)
Attempted to call report. No answer.

## 2019-12-08 NOTE — Consult Note (Addendum)
Hospital Consult  VASCULAR SURGERY ASSESSMENT & PLAN:   STAGE V CHRONIC KIDNEY DISEASE: This patient has stage V chronic kidney disease.  He needs permanent hemodialysis access.  He is scheduled for placement of a tunneled dialysis catheter tomorrow by interventional radiology.  He is right-handed.  He does not have a pacemaker.  Based on his vein map he would likely require a basilic vein transposition on the left.  I explained that this is often done in 2 stages depending upon the size of the vein.  He feels quite strongly about having access in the upper part of the arm.  Based on his vein map I explained that this is likely where it would be.  Once he is cleared from a cardiac standpoint we can schedule him for a left AV fistula or AV graft potentially later in the week.  Deitra Mayo, MD Office: [phone]   Reason for Consult:  Permanent dialysis access Requesting Physician:  Dr. Roney Jaffe MRN #:  [id number]  History of Present Illness: This is a 44 y.o. male who presented to ER yesterday with shortness of breath, chest pain, abdominal pain, nausea and vomiting and paresthesias of hands and feet. He was recently admitted 9/8-9/15 for hypertensive emergency and acute on chronic kidney disease stage 5 with similar symptoms. At his last admission it was felt that he would possibly need to start hemodialysis within 2 months, but now that he has had recurrent uremic symptoms Nephrology feels that it is best to initiate dialysis now. His GFR on admission is 8, Scr 8.27. At time of his recent admission a duplex of his upper extremities for vein mapping was obtained showing adequate bilateral basilic veins. He is scheduled to have temporary dialysis catheter placed tomorrow 9/22 by IR followed by his first dialysis treatment. He is right hand dominant. He has never had previous dialysis access, central lines, PICC line before. Vascular surgery has been asked to evaluate him for permanent  dialysis access.   Past Medical History:  Diagnosis Date  . CKD (chronic kidney disease)   . Hypertension   . OSA (obstructive sleep apnea)   . Polycystic kidney disease   . Vitamin D deficiency 11/2018    History reviewed. No pertinent surgical history.  No Known Allergies  Prior to Admission medications   Medication Sig Start Date End Date Taking? Authorizing Provider  acetaminophen (TYLENOL) 650 MG CR tablet Take 650 mg by mouth daily.    Yes [provider]  amLODipine (NORVASC) 10 MG tablet Take 1 tablet (10 mg total) by mouth daily. 11/28/19  Yes Geradine Girt, DO  calcitRIOL (ROCALTROL) 0.25 MCG capsule Take 1 capsule (0.25 mcg total) by mouth daily. 11/28/19  Yes Eulogio Bear U, DO  calcium carbonate (TUMS - DOSED IN MG ELEMENTAL CALCIUM) 500 MG chewable tablet Chew 1 tablet (200 mg of elemental calcium total) by mouth 3 (three) times daily. 12/02/19  Yes Samuella Cota, MD  camphor-menthol [facility]) lotion Apply topically as needed for itching. 12/02/19  Yes Samuella Cota, MD  carvedilol (COREG) 12.5 MG tablet Take 1 tablet (12.5 mg total) by mouth 2 (two) times daily with a meal. 12/02/19  Yes Samuella Cota, MD  hydrALAZINE (APRESOLINE) 100 MG tablet Take 1 tablet (100 mg total) by mouth every 8 (eight) hours. 11/27/19  Yes Eulogio Bear U, DO  isosorbide dinitrate (ISORDIL) 20 MG tablet Take 1 tablet (20 mg total) by mouth 3 (three) times daily. 12/02/19  Yes Sarajane Jews,  Melene Plan, MD  multivitamin (ONE-A-DAY MEN'S) TABS tablet Take 1 tablet by mouth daily. 03/21/18  Yes Lanae Boast, FNP  nitroGLYCERIN (NITROSTAT) 0.4 MG SL tablet Place 1 tablet (0.4 mg total) under the tongue every 5 (five) minutes as needed for chest pain. 12/02/19 12/01/20 Yes Samuella Cota, MD  rosuvastatin (CRESTOR) 10 MG tablet Take 1 tablet (10 mg total) by mouth daily. 12/02/19 12/01/20 Yes Samuella Cota, MD  sodium bicarbonate 650 MG tablet Take 1 tablet (650 mg total) by mouth 3  (three) times daily. 11/27/19  Yes Eulogio Bear U, DO  sodium zirconium cyclosilicate (LOKELMA) 10 g PACK packet Take 10 g by mouth 2 (two) times daily. 11/27/19  Yes Geradine Girt, DO  allopurinol (ZYLOPRIM) 100 MG tablet Take 1 tablet (100 mg total) by mouth 2 (two) times daily. Patient not taking: Reported on 12/07/2019 11/24/19   [name], FNP  aspirin EC 81 MG tablet Take 1 tablet (81 mg total) by mouth daily. Swallow whole. Patient not taking: Reported on 12/07/2019 12/02/19 12/01/20  Samuella Cota, MD    Social History   Socioeconomic History  . Marital status: Single    Spouse name: Not on file  . Number of children: Not on file  . Years of education: Not on file  . Highest education level: Not on file  Occupational History  . Not on file  Tobacco Use  . Smoking status: Former Research scientist (life sciences)  . Smokeless tobacco: Never Used  Vaping Use  . Vaping Use: Never used  Substance and Sexual Activity  . Alcohol use: Yes    Comment: wine occ   . Drug use: No  . Sexual activity: Yes  Other Topics Concern  . Not on file  Social History Narrative  . Not on file   Social Determinants of Health   Financial Resource Strain:   . Difficulty of Paying Living Expenses: Not on file  Food Insecurity:   . Worried About Charity fundraiser in the Last Year: Not on file  . Ran Out of Food in the Last Year: Not on file  Transportation Needs:   . Lack of Transportation (Medical): Not on file  . Lack of Transportation (Non-Medical): Not on file  Physical Activity:   . Days of Exercise per Week: Not on file  . Minutes of Exercise per Session: Not on file  Stress:   . Feeling of Stress : Not on file  Social Connections:   . Frequency of Communication with Friends and Family: Not on file  . Frequency of Social Gatherings with Friends and Family: Not on file  . Attends Religious Services: Not on file  . Active Member of Clubs or Organizations: Not on file  . Attends Archivist  Meetings: Not on file  . Marital Status: Not on file  Intimate Partner Violence:   . Fear of Current or Ex-Partner: Not on file  . Emotionally Abused: Not on file  . Physically Abused: Not on file  . Sexually Abused: Not on file    Family History  Problem Relation Age of Onset  . Bipolar disorder Father     ROS: Otherwise negative unless mentioned in HPI  Physical Examination  Vitals:   12/08/19 1221 12/08/19 1222  BP: (!) 150/104   Pulse: 96   Resp: 15   Temp:  98.6 F (37 C)  SpO2: 99%    There is no height or weight on file to calculate BMI.  General:  WDWN in NAD Gait: Not observed HENT: WNL, normocephalic Pulmonary: normal non-labored breathing, without wheezing Cardiac: regular rate and rhythm, without  Murmurs Abdomen: soft, NT/ND, no masses Vascular Exam/Pulses: 2+ radial and brachial pulses bilaterally, bilateral hands warm. 5/5 grip strength, normal ROM Extremities: without ischemic changes, without Gangrene , without cellulitis; without open wounds;  Musculoskeletal: no muscle wasting or atrophy  Neurologic: A&O X 3;  No focal weakness or paresthesias are detected; speech is fluent/normal Psychiatric:  The pt has Normal affect. Lymph:  Unremarkable  CBC    Component Value Date/Time   WBC 8.2 12/08/2019 0454   RBC 3.30 (L) 12/08/2019 0454   HGB 9.0 (L) 12/08/2019 0454   HGB 11.9 (L) 06/22/2019 1328   HCT 29.1 (L) 12/08/2019 0454   HCT 36.1 (L) 06/22/2019 1328   PLT 267 12/08/2019 0454   PLT 203 06/22/2019 1328   MCV 88.2 12/08/2019 0454   MCV 83 06/22/2019 1328   MCH 27.3 12/08/2019 0454   MCHC 30.9 12/08/2019 0454   RDW 15.2 12/08/2019 0454   RDW 14.5 06/22/2019 1328   LYMPHSABS 1.6 06/22/2019 1328   MONOABS 0.6 05/15/2019 1332   EOSABS 0.0 06/22/2019 1328   BASOSABS 0.0 06/22/2019 1328    BMET    Component Value Date/Time   NA 142 12/08/2019 0454   NA 139 11/24/2019 1027   K 4.5 12/08/2019 0454   CL 104 12/08/2019 0454   CO2 20 (L)  12/08/2019 0454   GLUCOSE 88 12/08/2019 0454   BUN 94 (H) 12/08/2019 0454   BUN 77 (HH) 11/24/2019 1027   CREATININE 8.27 (H) 12/08/2019 0454   CREATININE 2.34 (H) 10/09/2016 1540   CALCIUM 8.2 (L) 12/08/2019 0454   GFRNONAA 7 (L) 12/08/2019 0454   GFRNONAA 37 (L) 06/19/2016 1207   GFRAA 8 (L) 12/08/2019 0454   GFRAA 43 (L) 06/19/2016 1207    COAGS: Lab Results  Component Value Date   INR 1.3 (H) 12/08/2019   INR 1.2 05/16/2008     Non-Invasive Vascular Imaging:  11/27/19 VAS [place] Upper Ext Vein Mapping: +-----------------+-------------+----------+---------+  Right Cephalic  Diameter (cm)Depth (cm)Findings   +-----------------+-------------+----------+---------+  Shoulder       0.15     2.13         +-----------------+-------------+----------+---------+  Prox upper arm    0.24     2.40         +-----------------+-------------+----------+---------+  Mid upper arm    0.24     1.30  branching  +-----------------+-------------+----------+---------+  Dist upper arm    0.28     1.00         +-----------------+-------------+----------+---------+  Antecubital fossa  0.16     0.60  branching  +-----------------+-------------+----------+---------+  Prox forearm     0.14     0.21         +-----------------+-------------+----------+---------+  Mid forearm     0.16     0.21         +-----------------+-------------+----------+---------+  Dist forearm     0.16     0.20         +-----------------+-------------+----------+---------+  Wrist        0.17     0.27         +-----------------+-------------+----------+---------+   +-----------------+-------------+----------+---------+  Right Basilic  Diameter (cm)Depth (cm)Findings   +-----------------+-------------+----------+---------+  Shoulder       0.57                +-----------------+-------------+----------+---------+  Prox upper arm  0.56               +-----------------+-------------+----------+---------+  Mid upper arm    0.56               +-----------------+-------------+----------+---------+  Dist upper arm    0.69               +-----------------+-------------+----------+---------+  Antecubital fossa  0.46        branching  +-----------------+-------------+----------+---------+  Prox forearm     0.33        branching  +-----------------+-------------+----------+---------+  Mid forearm     0.36               +-----------------+-------------+----------+---------+  Distal forearm    0.23               +-----------------+-------------+----------+---------+  Wrist        0.19               +-----------------+-------------+----------+---------+   +-----------------+-------------+----------+---------+  Left Cephalic  Diameter (cm)Depth (cm)Findings   +-----------------+-------------+----------+---------+  Shoulder       0.24     1.90         +-----------------+-------------+----------+---------+  Prox upper arm    0.21     0.53         +-----------------+-------------+----------+---------+  Mid upper arm    0.23     0.50         +-----------------+-------------+----------+---------+  Dist upper arm    0.26     0.51  branching  +-----------------+-------------+----------+---------+  Antecubital fossa  0.19     0.39         +-----------------+-------------+----------+---------+  Prox forearm     0.15     0.21         +-----------------+-------------+----------+---------+  Mid forearm     0.10     0.19         +-----------------+-------------+----------+---------+    Dist forearm     0.13     0.21         +-----------------+-------------+----------+---------+  Wrist        0.14     0.30         +-----------------+-------------+----------+---------+   +-----------------+-------------+----------+---------+  Left Basilic   Diameter (cm)Depth (cm)Findings   +-----------------+-------------+----------+---------+  Shoulder       0.52               +-----------------+-------------+----------+---------+  Prox upper arm    0.34               +-----------------+-------------+----------+---------+  Mid upper arm    0.35               +-----------------+-------------+----------+---------+  Dist upper arm    0.39               +-----------------+-------------+----------+---------+  Antecubital fossa  0.39        branching  +-----------------+-------------+----------+---------+  Prox forearm     0.30               +-----------------+-------------+----------+---------+  Mid forearm     0.26               +-----------------+-------------+----------+---------+  Distal forearm    0.19               +-----------------+-------------+----------+---------+  Wrist        0.25               +-----------------+-------------+----------+---------+   Statin:  Yes.   Beta Blocker:  Yes.  Aspirin:  Yes.   ACEI:  No. ARB:  No. CCB use:  Yes Other antiplatelets/anticoagulants:  No.    ASSESSMENT/PLAN: This is a 44 y.o. male with acute on chronic kidney disease stage 5 now requiring hemodialysis. He is scheduled to have temporary catheter placed tomorrow by IR followed by his first HD treatment. He is right hand dominant. On vein mapping his bilateral basilic veins are adequate size, the right is better than left but will plan for left upper extremity  access. Will schedule him for a left upper extremity basilic vein fistula later this week. I discussed Risk, benefits, and alternatives to access surgery .The patient is aware the risks include but are not limited to: bleeding, infection, steal syndrome, nerve damage, failure to mature, and need for additional procedures. The patient questions were answered and he is agreeable to proceed. The on call vascular surgeon Dr. [name] will see and evaluate the patient this afternoon and provide further details about timing of the surgery   Karoline Caldwell PA-C [facility] [phone] 12/08/2019  4:15 PM

## 2019-12-08 NOTE — Consult Note (Signed)
CARDIOLOGY CONSULT NOTE  Patient ID: Michael [name] MRN: [id number] DOB/AGE: Jul 03, 1975 44 y.o.  Admit date: 12/07/2019 Referring Physician  Wendee Beavers, MD Primary Physician:  [name], FNP Reason for Consultation  Chest pain  Patient ID: Michael [name], male    DOB: 1975-04-27, 44 y.o.   MRN: [id number]  Chief Complaint  Patient presents with  . Chest Pain  . Shortness of Breath   HPI:    Michael [name]  is a 44 y.o. African-American male with end-stage renal disease secondary to polycystic kidney disease, hypertension, mixed hyperlipidemia, moderate obesity and obstructive sleep apnea on CPAP and compliant who was evaluated on 11/25/2019 when he presented with chest pain, fatigue, uncontrolled hypertension and acute on chronic kidney disease.  Due to chest pain he also underwent stress testing which had revealed intermediate risk stress test with inferolateral ischemia.  As patient was nearing end-stage renal disease and also the fact that his blood pressure was high and risk factors were not modified, we recommended medical therapy for possible CAD.  He is again admitted to the hospital on 12/07/2019 with again presenting with chest pain and shortness of breath, Nausea and abdominal discomfort and worsening renal function, was evaluated by nephrology and plans for permanent dialysis.  I was asked to see the patient in view of recurrent chest discomfort.  Patient states that since being in the hospital he has not had any significant chest pain but still continues to have mild discomfort occasionally and lasts a few minutes and subsides spontaneously.  He has not asked for any nitroglycerin.  No PND or orthopnea, no leg edema.  Past Medical History:  Diagnosis Date  . CKD (chronic kidney disease)   . Hypertension   . OSA (obstructive sleep apnea)   . Polycystic kidney disease   . Vitamin D deficiency 11/2018   History reviewed. No pertinent surgical history. Social History   Tobacco  Use  . Smoking status: Former Research scientist (life sciences)  . Smokeless tobacco: Never Used  Substance Use Topics  . Alcohol use: Yes    Comment: wine occ     Marital Sttus: Single  ROS  Review of Systems  Constitutional: Positive for malaise/fatigue.  Cardiovascular: Positive for chest pain and dyspnea on exertion. Negative for leg swelling.  Respiratory: Positive for snoring (on CPAP).   Gastrointestinal: Negative for melena.  Psychiatric/Behavioral: Positive for depression. The patient is nervous/anxious.   All other systems reviewed and are negative.  Objective   Vitals with BMI 12/08/2019 12/08/2019 12/08/2019  Height - - -  Weight - - -  BMI - - -  Systolic 938 182 993  Diastolic 82 716 74  Pulse 77 96 -    Blood pressure (!) 160/82, pulse 77, temperature 97.8 F (36.6 C), temperature source Oral, resp. rate 18, SpO2 95 %.   There is no height or weight on file to calculate BMI.  Physical Exam Constitutional:      Appearance: He is well-developed. He is obese.  Cardiovascular:     Rate and Rhythm: Normal rate and regular rhythm.     Pulses: Intact distal pulses.     Heart sounds: Normal heart sounds. No murmur heard.  No gallop.      Comments: No leg edema, no JVD. Pulmonary:     Effort: Pulmonary effort is normal.     Breath sounds: Normal breath sounds.  Abdominal:     General: Bowel sounds are normal.     Palpations: Abdomen is soft.  Musculoskeletal:  General: Normal range of motion.     Cervical back: Normal range of motion.  Skin:    General: Skin is warm and dry.  Neurological:     General: No focal deficit present.     Mental Status: He is alert.    Laboratory examination:   Recent Labs    12/02/19 0600 12/07/19 0640 12/08/19 0454  NA 138 134* 142  K 4.6 4.2 4.5  CL 103 97* 104  CO2 22 17* 20*  GLUCOSE 91 99 88  BUN 75* 94* 94*  CREATININE 7.98* 8.15* 8.27*  CALCIUM 7.5* 8.4* 8.2*  GFRNONAA 7* 7* 7*  GFRAA 9* 8* 8*   estimated creatinine clearance  is 15.4 mL/min (A) (by C-G formula based on SCr of 8.27 mg/dL (H)).  CMP Latest Ref Rng & Units 12/08/2019 12/07/2019 12/02/2019  Glucose 70 - 99 mg/dL 88 99 91  BUN 6 - 20 mg/dL 94(H) 94(H) 75(H)  Creatinine 0.61 - 1.24 mg/dL 8.27(H) 8.15(H) 7.98(H)  Sodium 135 - 145 mmol/L 142 134(L) 138  Potassium 3.5 - 5.1 mmol/L 4.5 4.2 4.6  Chloride 98 - 111 mmol/L 104 97(L) 103  CO2 22 - 32 mmol/L 20(L) 17(L) 22  Calcium 8.9 - 10.3 mg/dL 8.2(L) 8.4(L) 7.5(L)  Total Protein 6.5 - 8.1 g/dL 7.4 8.1 -  Total Bilirubin 0.3 - 1.2 mg/dL 0.4 0.7 -  Alkaline Phos 38 - 126 U/L 36(L) 41 -  AST 15 - 41 U/L 13(L) 16 -  ALT 0 - 44 U/L 12 15 -   CBC Latest Ref Rng & Units 12/08/2019 12/07/2019 12/02/2019  WBC 4.0 - 10.5 K/uL 8.2 9.4 9.5  Hemoglobin 13.0 - 17.0 g/dL 9.0(L) 10.2(L) 9.5(L)  Hematocrit 39 - 52 % 29.1(L) 32.9(L) 31.1(L)  Platelets 150 - 400 K/uL 267 282 257   Lipid Panel Recent Labs    12/12/18 1038 12/02/19 0600  CHOL 221* 173  TRIG 263* 267*  LDLCALC 138* 95  VLDL  --  53*  HDL 35* 25*  CHOLHDL 6.3* 6.9    HEMOGLOBIN A1C Lab Results  Component Value Date   HGBA1C 5.6 11/24/2019   HGBA1C 5.6 11/24/2019   HGBA1C 5.6 (A) 11/24/2019   HGBA1C 5.6 11/24/2019   TSH Recent Labs    12/12/18 1038  TSH 2.730    Ref Range & Units 1 d ago  (12/07/19) 1 d ago  (12/07/19) 7 d ago  (12/01/19) 7 d ago  (12/01/19) 8 d ago  (11/30/19)  Troponin I (High Sensitivity) <18 ng/L 34High  40High CM  75High CM  87High CM  119High Panic CM        Medications and allergies  No Known Allergies   Current Meds  Medication Sig  . acetaminophen (TYLENOL) 650 MG CR tablet Take 650 mg by mouth daily.   Marland Kitchen amLODipine (NORVASC) 10 MG tablet Take 1 tablet (10 mg total) by mouth daily.  . calcitRIOL (ROCALTROL) 0.25 MCG capsule Take 1 capsule (0.25 mcg total) by mouth daily.  . calcium carbonate (TUMS - DOSED IN MG ELEMENTAL CALCIUM) 500 MG chewable tablet Chew 1 tablet (200 mg of elemental calcium  total) by mouth 3 (three) times daily.  . camphor-menthol (SARNA) lotion Apply topically as needed for itching.  . carvedilol (COREG) 12.5 MG tablet Take 1 tablet (12.5 mg total) by mouth 2 (two) times daily with a meal.  . hydrALAZINE (APRESOLINE) 100 MG tablet Take 1 tablet (100 mg total) by mouth every 8 (eight) hours.  Marland Kitchen  isosorbide dinitrate (ISORDIL) 20 MG tablet Take 1 tablet (20 mg total) by mouth 3 (three) times daily.  . multivitamin (ONE-A-DAY MEN'S) TABS tablet Take 1 tablet by mouth daily.  . nitroGLYCERIN (NITROSTAT) 0.4 MG SL tablet Place 1 tablet (0.4 mg total) under the tongue every 5 (five) minutes as needed for chest pain.  . rosuvastatin (CRESTOR) 10 MG tablet Take 1 tablet (10 mg total) by mouth daily.  . sodium bicarbonate 650 MG tablet Take 1 tablet (650 mg total) by mouth 3 (three) times daily.  . sodium zirconium cyclosilicate (LOKELMA) 10 g PACK packet Take 10 g by mouth 2 (two) times daily.    Scheduled Meds: . amLODipine  10 mg Oral Daily  . aspirin EC  81 mg Oral Daily  . calcitRIOL  0.25 mcg Oral Daily  . calcium carbonate  1 tablet Oral TID  . [START ON 12/09/2019] carvedilol  25 mg Oral BID WC  . Chlorhexidine Gluconate Cloth  6 each Topical Q0600  . docusate sodium  100 mg Oral BID  . [START ON 12/10/2019] heparin  5,000 Units Subcutaneous Q8H  . hydrALAZINE  100 mg Oral Q8H  . isosorbide dinitrate  20 mg Oral TID  . multivitamin with minerals  1 tablet Oral Daily  . rosuvastatin  10 mg Oral Daily  . sodium bicarbonate  650 mg Oral TID  . sodium chloride flush  3 mL Intravenous Q12H   Continuous Infusions: . [START ON 12/09/2019]  ceFAZolin (ANCEF) IV     PRN Meds:.acetaminophen **OR** acetaminophen, metoprolol tartrate, nitroGLYCERIN, polyethylene glycol   No intake/output data recorded. Total I/O In: -  Out: 1200 [Urine:1200]    Radiology:   DG Chest 2 View Result Date: 12/07/2019 CLINICAL DATA:  Headache and chest pressure EXAM: CHEST - 2 VIEW  COMPARISON:  05/15/2019 FINDINGS: Artifact from EKG leads. There is no edema, consolidation, effusion, or pneumothorax. Normal heart size and mediastinal contours. IMPRESSION: No active cardiopulmonary disease.  CT Head Wo Contrast Result Date: 12/07/2019 Mild symmetric frontal atrophy bilaterally. Ventricles and sulci otherwise appear normal. No mass or hemorrhage evident. No findings suggesting acute infarct. Mucosal thickening in several paranasal sinus regions noted.  DG Abd 2 Views Result Date: 12/07/2019 onstipation, N/V today EXAM: ABDOMEN - 2 VIEW COMPARISON:  11/27/2019 FINDINGS: Visualized lung bases clear. No free air. Normal bowel gas pattern. No abnormal abdominal calcifications. Regional bones unremarkable.   Cardiac Studies:   Echocardiogram 11/30/2019:  1. Left ventricular ejection fraction, by estimation, is 60 to 65%. The left ventricle has normal function. The left ventricle has no regional wall motion abnormalities. There is severe left ventricular wall thickening. Wall thickness is 2.5 cm average,  concerning for infiltrative cardiomyopathy such as amyloidosis. Impaired diastolic function with indeterminate LV filling pressure.  2. The aortic valve is tricuspid. Aortic valve regurgitation is not visualized.  3. The pericardial effusion is localized near the right ventricle and circumferential.  4. The inferior vena cava is dilated in size with >50% respiratory variability, suggesting right atrial pressure of 8 mmHg.  5. Right ventricular systolic function is normal. The right ventricular size is normal. There is normal pulmonary artery systolic pressure. The estimated right ventricular systolic pressure is 06.3 mmHg.  6. The mitral valve is grossly normal. Trivial mitral valve regurgitation.  Lexiscan Sestamibi Stress Test 12/01/2019:  1. Moderate size region of reversible ischemia in the mid segment lateral wall. 2. Global hypokinesia and LEFT ventricular dilatation. 3. Left  ventricular ejection fraction 42% 4.  Non invasive risk stratification*: Intermediate  EKG:  EKG 12/07/2019: Normal sinus rhythm with rate of 97 bpm, left axis deviation, poor R wave progression, cannot exclude anteroseptal infarct old.  LVH with repolarization abnormality, cannot exclude lateral ischemia.  Abnormal EKG.  Assessment   1.  Atypical chest pain, serum troponins chronically mildly abnormal but a week ago he had moderate elevation in serum troponin probably related to hypertensive urgency but cannot exclude underlying significant coronary artery disease and unstable angina pectoris. 2.  Abnormal nuclear stress test, intermediate risk 3.  Hypertension with hypertensive heart disease with findings of severe LVH and infiltrative cardiomyopathy probably related to longstanding hypertension. 4.  Polycystic kidney disease with end-stage renal disease now will be on hemodialysis.  Temporary dialysis catheter being placed tomorrow with plans on dialysis tomorrow.  Also surgical AV shunt placement planned by Dr. Doren Custard at a future date when stable from cardiac standpoint. 5.  Mixed hyperlipidemia  Medications Discontinued During This Encounter  Medication Reason  . heparin injection 5,000 Units   . sodium zirconium cyclosilicate (LOKELMA) packet 10 g   . carvedilol (COREG) tablet 12.5 mg     Recommendations:   Patient symptoms of chest pain may also indicate unstable angina although no significant rise in serum troponins.  About a week ago his troponin level was clearly 3 times elevated, however felt to be hypertensive heart disease and type II MI.  He does have significant cardiovascular risk factors, in view of recurrent presentation with dyspnea and also chest pain, best option is to proceed with cardiac catheterization as he is already had a abnormal nuclear stress test.  As patient has end-stage renal disease, as plans are on for hemodialysis, I plan to go ahead and set him up for  cardiac catheterization tomorrow. Schedule for cardiac catheterization, and possible angioplasty. We discussed regarding risks, benefits, alternatives to this including stress testing, CTA and continued medical therapy. Patient wants to proceed. Understands <1-2% risk of death, stroke, MI, urgent CABG, bleeding, infection but not limited to these.   Otherwise he is presently on appropriate medical therapy, blood pressure is still uncontrolled, will increase carvedilol to 25 mg p.o. twice daily.   Adrian Prows, MD, [facility] 12/08/2019, 6:35 PM Office: [phone]

## 2019-12-08 NOTE — Progress Notes (Signed)
NEW ADMISSION NOTE New Admission Note:   Arrival Method: [facility] stretcher Mental Orientation: Alert and oriented x 4 Telemetry: #12 NSR Assessment: Completed Skin:Intact .assessed with Kathleen Lime. IV: R.A.-NSL Pain:Denies Tubes:None Safety Measures: Safety Fall Prevention Plan has been given, discussed and signed Admission: Completed [facility] Orientation: Patient has been orientated to the room, unit and staff.  Family:None  Orders have been reviewed and implemented. Will continue to monitor the patient. Call light has been placed within reach and bed alarm has been activated.   [place], Zenon Mayo, RN

## 2019-12-08 NOTE — H&P (View-Only) (Signed)
CARDIOLOGY CONSULT NOTE  Patient ID: Michael [name] MRN: [id number] DOB/AGE: 07-27-1975 44 y.o.  Admit date: 12/07/2019 Referring Physician  Wendee Beavers, MD Primary Physician:  [name], FNP Reason for Consultation  Chest pain  Patient ID: Michael [name], male    DOB: September 28, 1975, 44 y.o.   MRN: [id number]  Chief Complaint  Patient presents with  . Chest Pain  . Shortness of Breath   HPI:    Michael [name]  is a 44 y.o. African-American male with end-stage renal disease secondary to polycystic kidney disease, hypertension, mixed hyperlipidemia, moderate obesity and obstructive sleep apnea on CPAP and compliant who was evaluated on 11/25/2019 when he presented with chest pain, fatigue, uncontrolled hypertension and acute on chronic kidney disease.  Due to chest pain he also underwent stress testing which had revealed intermediate risk stress test with inferolateral ischemia.  As patient was nearing end-stage renal disease and also the fact that his blood pressure was high and risk factors were not modified, we recommended medical therapy for possible CAD.  He is again admitted to the hospital on 12/07/2019 with again presenting with chest pain and shortness of breath, Nausea and abdominal discomfort and worsening renal function, was evaluated by nephrology and plans for permanent dialysis.  I was asked to see the patient in view of recurrent chest discomfort.  Patient states that since being in the hospital he has not had any significant chest pain but still continues to have mild discomfort occasionally and lasts a few minutes and subsides spontaneously.  He has not asked for any nitroglycerin.  No PND or orthopnea, no leg edema.  Past Medical History:  Diagnosis Date  . CKD (chronic kidney disease)   . Hypertension   . OSA (obstructive sleep apnea)   . Polycystic kidney disease   . Vitamin D deficiency 11/2018   History reviewed. No pertinent surgical history. Social History   Tobacco  Use  . Smoking status: Former Research scientist (life sciences)  . Smokeless tobacco: Never Used  Substance Use Topics  . Alcohol use: Yes    Comment: wine occ     Marital Sttus: Single  ROS  Review of Systems  Constitutional: Positive for malaise/fatigue.  Cardiovascular: Positive for chest pain and dyspnea on exertion. Negative for leg swelling.  Respiratory: Positive for snoring (on CPAP).   Gastrointestinal: Negative for melena.  Psychiatric/Behavioral: Positive for depression. The patient is nervous/anxious.   All other systems reviewed and are negative.  Objective   Vitals with BMI 12/08/2019 12/08/2019 12/08/2019  Height - - -  Weight - - -  BMI - - -  Systolic 295 188 416  Diastolic 82 606 74  Pulse 77 96 -    Blood pressure (!) 160/82, pulse 77, temperature 97.8 F (36.6 C), temperature source Oral, resp. rate 18, SpO2 95 %.   There is no height or weight on file to calculate BMI.  Physical Exam Constitutional:      Appearance: He is well-developed. He is obese.  Cardiovascular:     Rate and Rhythm: Normal rate and regular rhythm.     Pulses: Intact distal pulses.     Heart sounds: Normal heart sounds. No murmur heard.  No gallop.      Comments: No leg edema, no JVD. Pulmonary:     Effort: Pulmonary effort is normal.     Breath sounds: Normal breath sounds.  Abdominal:     General: Bowel sounds are normal.     Palpations: Abdomen is soft.  Musculoskeletal:  General: Normal range of motion.     Cervical back: Normal range of motion.  Skin:    General: Skin is warm and dry.  Neurological:     General: No focal deficit present.     Mental Status: He is alert.    Laboratory examination:   Recent Labs    12/02/19 0600 12/07/19 0640 12/08/19 0454  NA 138 134* 142  K 4.6 4.2 4.5  CL 103 97* 104  CO2 22 17* 20*  GLUCOSE 91 99 88  BUN 75* 94* 94*  CREATININE 7.98* 8.15* 8.27*  CALCIUM 7.5* 8.4* 8.2*  GFRNONAA 7* 7* 7*  GFRAA 9* 8* 8*   estimated creatinine clearance  is 15.4 mL/min (A) (by C-G formula based on SCr of 8.27 mg/dL (H)).  CMP Latest Ref Rng & Units 12/08/2019 12/07/2019 12/02/2019  Glucose 70 - 99 mg/dL 88 99 91  BUN 6 - 20 mg/dL 94(H) 94(H) 75(H)  Creatinine 0.61 - 1.24 mg/dL 8.27(H) 8.15(H) 7.98(H)  Sodium 135 - 145 mmol/L 142 134(L) 138  Potassium 3.5 - 5.1 mmol/L 4.5 4.2 4.6  Chloride 98 - 111 mmol/L 104 97(L) 103  CO2 22 - 32 mmol/L 20(L) 17(L) 22  Calcium 8.9 - 10.3 mg/dL 8.2(L) 8.4(L) 7.5(L)  Total Protein 6.5 - 8.1 g/dL 7.4 8.1 -  Total Bilirubin 0.3 - 1.2 mg/dL 0.4 0.7 -  Alkaline Phos 38 - 126 U/L 36(L) 41 -  AST 15 - 41 U/L 13(L) 16 -  ALT 0 - 44 U/L 12 15 -   CBC Latest Ref Rng & Units 12/08/2019 12/07/2019 12/02/2019  WBC 4.0 - 10.5 K/uL 8.2 9.4 9.5  Hemoglobin 13.0 - 17.0 g/dL 9.0(L) 10.2(L) 9.5(L)  Hematocrit 39 - 52 % 29.1(L) 32.9(L) 31.1(L)  Platelets 150 - 400 K/uL 267 282 257   Lipid Panel Recent Labs    12/12/18 1038 12/02/19 0600  CHOL 221* 173  TRIG 263* 267*  LDLCALC 138* 95  VLDL  --  53*  HDL 35* 25*  CHOLHDL 6.3* 6.9    HEMOGLOBIN A1C Lab Results  Component Value Date   HGBA1C 5.6 11/24/2019   HGBA1C 5.6 11/24/2019   HGBA1C 5.6 (A) 11/24/2019   HGBA1C 5.6 11/24/2019   TSH Recent Labs    12/12/18 1038  TSH 2.730    Ref Range & Units 1 d ago  (12/07/19) 1 d ago  (12/07/19) 7 d ago  (12/01/19) 7 d ago  (12/01/19) 8 d ago  (11/30/19)  Troponin I (High Sensitivity) <18 ng/L 34High  40High CM  75High CM  87High CM  119High Panic CM        Medications and allergies  No Known Allergies   Current Meds  Medication Sig  . acetaminophen (TYLENOL) 650 MG CR tablet Take 650 mg by mouth daily.   Marland Kitchen amLODipine (NORVASC) 10 MG tablet Take 1 tablet (10 mg total) by mouth daily.  . calcitRIOL (ROCALTROL) 0.25 MCG capsule Take 1 capsule (0.25 mcg total) by mouth daily.  . calcium carbonate (TUMS - DOSED IN MG ELEMENTAL CALCIUM) 500 MG chewable tablet Chew 1 tablet (200 mg of elemental calcium  total) by mouth 3 (three) times daily.  . camphor-menthol (SARNA) lotion Apply topically as needed for itching.  . carvedilol (COREG) 12.5 MG tablet Take 1 tablet (12.5 mg total) by mouth 2 (two) times daily with a meal.  . hydrALAZINE (APRESOLINE) 100 MG tablet Take 1 tablet (100 mg total) by mouth every 8 (eight) hours.  Marland Kitchen  isosorbide dinitrate (ISORDIL) 20 MG tablet Take 1 tablet (20 mg total) by mouth 3 (three) times daily.  . multivitamin (ONE-A-DAY MEN'S) TABS tablet Take 1 tablet by mouth daily.  . nitroGLYCERIN (NITROSTAT) 0.4 MG SL tablet Place 1 tablet (0.4 mg total) under the tongue every 5 (five) minutes as needed for chest pain.  . rosuvastatin (CRESTOR) 10 MG tablet Take 1 tablet (10 mg total) by mouth daily.  . sodium bicarbonate 650 MG tablet Take 1 tablet (650 mg total) by mouth 3 (three) times daily.  . sodium zirconium cyclosilicate (LOKELMA) 10 g PACK packet Take 10 g by mouth 2 (two) times daily.    Scheduled Meds: . amLODipine  10 mg Oral Daily  . aspirin EC  81 mg Oral Daily  . calcitRIOL  0.25 mcg Oral Daily  . calcium carbonate  1 tablet Oral TID  . [START ON 12/09/2019] carvedilol  25 mg Oral BID WC  . Chlorhexidine Gluconate Cloth  6 each Topical Q0600  . docusate sodium  100 mg Oral BID  . [START ON 12/10/2019] heparin  5,000 Units Subcutaneous Q8H  . hydrALAZINE  100 mg Oral Q8H  . isosorbide dinitrate  20 mg Oral TID  . multivitamin with minerals  1 tablet Oral Daily  . rosuvastatin  10 mg Oral Daily  . sodium bicarbonate  650 mg Oral TID  . sodium chloride flush  3 mL Intravenous Q12H   Continuous Infusions: . [START ON 12/09/2019]  ceFAZolin (ANCEF) IV     PRN Meds:.acetaminophen **OR** acetaminophen, metoprolol tartrate, nitroGLYCERIN, polyethylene glycol   No intake/output data recorded. Total I/O In: -  Out: 1200 [Urine:1200]    Radiology:   DG Chest 2 View Result Date: 12/07/2019 CLINICAL DATA:  Headache and chest pressure EXAM: CHEST - 2 VIEW  COMPARISON:  05/15/2019 FINDINGS: Artifact from EKG leads. There is no edema, consolidation, effusion, or pneumothorax. Normal heart size and mediastinal contours. IMPRESSION: No active cardiopulmonary disease.  CT Head Wo Contrast Result Date: 12/07/2019 Mild symmetric frontal atrophy bilaterally. Ventricles and sulci otherwise appear normal. No mass or hemorrhage evident. No findings suggesting acute infarct. Mucosal thickening in several paranasal sinus regions noted.  DG Abd 2 Views Result Date: 12/07/2019 onstipation, N/V today EXAM: ABDOMEN - 2 VIEW COMPARISON:  11/27/2019 FINDINGS: Visualized lung bases clear. No free air. Normal bowel gas pattern. No abnormal abdominal calcifications. Regional bones unremarkable.   Cardiac Studies:   Echocardiogram 11/30/2019:  1. Left ventricular ejection fraction, by estimation, is 60 to 65%. The left ventricle has normal function. The left ventricle has no regional wall motion abnormalities. There is severe left ventricular wall thickening. Wall thickness is 2.5 cm average,  concerning for infiltrative cardiomyopathy such as amyloidosis. Impaired diastolic function with indeterminate LV filling pressure.  2. The aortic valve is tricuspid. Aortic valve regurgitation is not visualized.  3. The pericardial effusion is localized near the right ventricle and circumferential.  4. The inferior vena cava is dilated in size with >50% respiratory variability, suggesting right atrial pressure of 8 mmHg.  5. Right ventricular systolic function is normal. The right ventricular size is normal. There is normal pulmonary artery systolic pressure. The estimated right ventricular systolic pressure is 10.9 mmHg.  6. The mitral valve is grossly normal. Trivial mitral valve regurgitation.  Lexiscan Sestamibi Stress Test 12/01/2019:  1. Moderate size region of reversible ischemia in the mid segment lateral wall. 2. Global hypokinesia and LEFT ventricular dilatation. 3. Left  ventricular ejection fraction 42% 4.  Non invasive risk stratification*: Intermediate  EKG:  EKG 12/07/2019: Normal sinus rhythm with rate of 97 bpm, left axis deviation, poor R wave progression, cannot exclude anteroseptal infarct old.  LVH with repolarization abnormality, cannot exclude lateral ischemia.  Abnormal EKG.  Assessment   1.  Atypical chest pain, serum troponins chronically mildly abnormal but a week ago he had moderate elevation in serum troponin probably related to hypertensive urgency but cannot exclude underlying significant coronary artery disease and unstable angina pectoris. 2.  Abnormal nuclear stress test, intermediate risk 3.  Hypertension with hypertensive heart disease with findings of severe LVH and infiltrative cardiomyopathy probably related to longstanding hypertension. 4.  Polycystic kidney disease with end-stage renal disease now will be on hemodialysis.  Temporary dialysis catheter being placed tomorrow with plans on dialysis tomorrow.  Also surgical AV shunt placement planned by Dr. Doren Custard at a future date when stable from cardiac standpoint. 5.  Mixed hyperlipidemia  Medications Discontinued During This Encounter  Medication Reason  . heparin injection 5,000 Units   . sodium zirconium cyclosilicate (LOKELMA) packet 10 g   . carvedilol (COREG) tablet 12.5 mg     Recommendations:   Patient symptoms of chest pain may also indicate unstable angina although no significant rise in serum troponins.  About a week ago his troponin level was clearly 3 times elevated, however felt to be hypertensive heart disease and type II MI.  He does have significant cardiovascular risk factors, in view of recurrent presentation with dyspnea and also chest pain, best option is to proceed with cardiac catheterization as he is already had a abnormal nuclear stress test.  As patient has end-stage renal disease, as plans are on for hemodialysis, I plan to go ahead and set him up for  cardiac catheterization tomorrow. Schedule for cardiac catheterization, and possible angioplasty. We discussed regarding risks, benefits, alternatives to this including stress testing, CTA and continued medical therapy. Patient wants to proceed. Understands <1-2% risk of death, stroke, MI, urgent CABG, bleeding, infection but not limited to these.   Otherwise he is presently on appropriate medical therapy, blood pressure is still uncontrolled, will increase carvedilol to 25 mg p.o. twice daily.   Adrian Prows, MD, [facility] 12/08/2019, 6:35 PM Office: [phone]

## 2019-12-08 NOTE — Consult Note (Signed)
Chief Complaint: Patient was seen in consultation today for tunneled dialysis catheter placement Chief Complaint  Patient presents with  . Chest Pain  . Shortness of Breath   at the request of Dr Mickel Crow   Supervising Physician: Dr Ruthann Cancer  Patient Status: [facility] - In-pt  History of Present Illness: Michael [name] is a 44 y.o. male   CKD 5 Polycystic kidney disease Rising creatinine Admission last mo for hypertension emergency Was seen by Dr Hollie Salk then-- no urgent need for dialysis then Returns to hospital with chest pain; sob; Nausea and abd pain Creatinine level higher than few weeks ago  Was seen by Nephrology- Dr Jonnie Finner Now uremic Rec: hemodialysis asap  Note 9/20:  CKD V/ polycystic kidney disease - w/ suspected uremia (e.g,  Nausea) which was prominent on last admit also.  Missed outpt visit this week w/ his nephrologist.  Pt has poor compliance. Creat up to 8's.   Recommend that we go ahead and start dialysis this admission given hx of noncompliance, and current uremia symptoms, in order to make sure he does not have a more serious problem such as life-threatening electrolyte changes. Consulting IR for [facility], will need perm access as well prior to dc. Does not have insurance will consult SW  Scheduled now for tunneled dialysis catheter placement Has eaten today Will keep npo for 9/22 procedure  Past Medical History:  Diagnosis Date  . CKD (chronic kidney disease)   . Hypertension   . OSA (obstructive sleep apnea)   . Polycystic kidney disease   . Vitamin D deficiency 11/2018    History reviewed. No pertinent surgical history.  Allergies: Patient has no known allergies.  Medications: Prior to Admission medications   Medication Sig Start Date End Date Taking? Authorizing Provider  acetaminophen (TYLENOL) 650 MG CR tablet Take 650 mg by mouth daily.    Yes [provider]  amLODipine (NORVASC) 10 MG tablet Take 1 tablet (10 mg total) by mouth  daily. 11/28/19  Yes Geradine Girt, DO  calcitRIOL (ROCALTROL) 0.25 MCG capsule Take 1 capsule (0.25 mcg total) by mouth daily. 11/28/19  Yes Eulogio Bear U, DO  calcium carbonate (TUMS - DOSED IN MG ELEMENTAL CALCIUM) 500 MG chewable tablet Chew 1 tablet (200 mg of elemental calcium total) by mouth 3 (three) times daily. 12/02/19  Yes Samuella Cota, MD  camphor-menthol [facility]) lotion Apply topically as needed for itching. 12/02/19  Yes Samuella Cota, MD  carvedilol (COREG) 12.5 MG tablet Take 1 tablet (12.5 mg total) by mouth 2 (two) times daily with a meal. 12/02/19  Yes Samuella Cota, MD  hydrALAZINE (APRESOLINE) 100 MG tablet Take 1 tablet (100 mg total) by mouth every 8 (eight) hours. 11/27/19  Yes Eulogio Bear U, DO  isosorbide dinitrate (ISORDIL) 20 MG tablet Take 1 tablet (20 mg total) by mouth 3 (three) times daily. 12/02/19  Yes Samuella Cota, MD  multivitamin (ONE-A-DAY MEN'S) TABS tablet Take 1 tablet by mouth daily. 03/21/18  Yes Lanae Boast, FNP  nitroGLYCERIN (NITROSTAT) 0.4 MG SL tablet Place 1 tablet (0.4 mg total) under the tongue every 5 (five) minutes as needed for chest pain. 12/02/19 12/01/20 Yes Samuella Cota, MD  rosuvastatin (CRESTOR) 10 MG tablet Take 1 tablet (10 mg total) by mouth daily. 12/02/19 12/01/20 Yes Samuella Cota, MD  sodium bicarbonate 650 MG tablet Take 1 tablet (650 mg total) by mouth 3 (three) times daily. 11/27/19  Yes Geradine Girt, DO  sodium zirconium cyclosilicate (LOKELMA) 10 g PACK packet Take 10 g by mouth 2 (two) times daily. 11/27/19  Yes Geradine Girt, DO  allopurinol (ZYLOPRIM) 100 MG tablet Take 1 tablet (100 mg total) by mouth 2 (two) times daily. Patient not taking: Reported on 12/07/2019 11/24/19   [name], FNP  aspirin EC 81 MG tablet Take 1 tablet (81 mg total) by mouth daily. Swallow whole. Patient not taking: Reported on 12/07/2019 12/02/19 12/01/20  Samuella Cota, MD     Family History  Problem  Relation Age of Onset  . Bipolar disorder Father     Social History   Socioeconomic History  . Marital status: Single    Spouse name: Not on file  . Number of children: Not on file  . Years of education: Not on file  . Highest education level: Not on file  Occupational History  . Not on file  Tobacco Use  . Smoking status: Former Research scientist (life sciences)  . Smokeless tobacco: Never Used  Vaping Use  . Vaping Use: Never used  Substance and Sexual Activity  . Alcohol use: Yes    Comment: wine occ   . Drug use: No  . Sexual activity: Yes  Other Topics Concern  . Not on file  Social History Narrative  . Not on file   Social Determinants of Health   Financial Resource Strain:   . Difficulty of Paying Living Expenses: Not on file  Food Insecurity:   . Worried About Charity fundraiser in the Last Year: Not on file  . Ran Out of Food in the Last Year: Not on file  Transportation Needs:   . Lack of Transportation (Medical): Not on file  . Lack of Transportation (Non-Medical): Not on file  Physical Activity:   . Days of Exercise per Week: Not on file  . Minutes of Exercise per Session: Not on file  Stress:   . Feeling of Stress : Not on file  Social Connections:   . Frequency of Communication with Friends and Family: Not on file  . Frequency of Social Gatherings with Friends and Family: Not on file  . Attends Religious Services: Not on file  . Active Member of Clubs or Organizations: Not on file  . Attends Archivist Meetings: Not on file  . Marital Status: Not on file     Review of Systems: A 12 point ROS discussed and pertinent positives are indicated in the HPI above.  All other systems are negative.  Review of Systems  Constitutional: Positive for activity change and fatigue. Negative for fever.  Respiratory: Positive for shortness of breath. Negative for cough.   Gastrointestinal: Positive for nausea. Negative for abdominal pain.  Neurological: Negative for weakness.    Psychiatric/Behavioral: Negative for behavioral problems and confusion.    Vital Signs: BP (!) 150/104   Pulse 96   Temp 98.6 F (37 C) (Oral)   Resp 15   SpO2 99%   Physical Exam Vitals reviewed.  Cardiovascular:     Rate and Rhythm: Normal rate and regular rhythm.     Heart sounds: Normal heart sounds.  Pulmonary:     Breath sounds: No decreased breath sounds, wheezing, rhonchi or rales.  Abdominal:     Palpations: Abdomen is soft.  Musculoskeletal:        General: Normal range of motion.  Skin:    General: Skin is warm.  Neurological:     Mental Status: He is alert and oriented  to person, place, and time.  Psychiatric:        Behavior: Behavior normal.     Imaging: DG Chest 2 View  Result Date: 12/07/2019 CLINICAL DATA:  Headache and chest pressure EXAM: CHEST - 2 VIEW COMPARISON:  05/15/2019 FINDINGS: Artifact from EKG leads. There is no edema, consolidation, effusion, or pneumothorax. Normal heart size and mediastinal contours. IMPRESSION: No active cardiopulmonary disease. Electronically Signed   By: Monte Fantasia M.D.   On: 12/07/2019 07:40   DG Abd 1 View  Result Date: 11/27/2019 CLINICAL DATA:  Nausea EXAM: X-RAY ABDOMEN 1 VIEW COMPARISON:  03/28/2017 FINDINGS: The bowel gas pattern is normal. No radio-opaque calculi or other significant radiographic abnormality are seen. IMPRESSION: Negative. Electronically Signed   By: Donavan Foil M.D.   On: 11/27/2019 18:18   CT Head Wo Contrast  Result Date: 12/07/2019 CLINICAL DATA:  Headache and fatigue. Upper and lower extremity tingling EXAM: CT HEAD WITHOUT CONTRAST TECHNIQUE: Contiguous axial images were obtained from the base of the skull through the vertex without intravenous contrast. COMPARISON:  None. FINDINGS: Brain: Ventricles are normal in size and configuration. There is symmetric frontal atrophy bilaterally. Sulci elsewhere appear normal. There is no intracranial mass hemorrhage, extra-axial fluid  collection, or midline shift. Brain parenchyma appears unremarkable without evident acute infarct. Vascular: No hyperdense vessel. No appreciable vascular calcification. Skull: Bony calvarium appears intact. Sinuses/Orbits: There is mucosal thickening in the posterior right maxillary antrum. There is slight mucosal thickening in several ethmoid air cells. Orbits appear symmetric bilaterally. Other: Mastoid air cells are clear. IMPRESSION: Mild symmetric frontal atrophy bilaterally. Ventricles and sulci otherwise appear normal. No mass or hemorrhage evident. No findings suggesting acute infarct. Mucosal thickening in several paranasal sinus regions noted. Electronically Signed   By: Lowella Grip III M.D.   On: 12/07/2019 09:31   NM Myocar Multi W/Spect W/Wall Motion / EF  Result Date: 12/02/2019 CLINICAL DATA:  Chest pain. EXAM: MYOCARDIAL IMAGING WITH SPECT (REST AND PHARMACOLOGIC-STRESS) GATED LEFT VENTRICULAR WALL MOTION STUDY LEFT VENTRICULAR EJECTION FRACTION TECHNIQUE: Standard myocardial SPECT imaging was performed after resting intravenous injection of 10 mCi Tc-19m tetrofosmin. Subsequently, intravenous infusion of Lexiscan was performed under the supervision of the Cardiology staff. At peak effect of the drug, 30 mCi Tc-71m tetrofosmin was injected intravenously and standard myocardial SPECT imaging was performed. Quantitative gated imaging was also performed to evaluate left ventricular wall motion, and estimate left ventricular ejection fraction. COMPARISON:  None. FINDINGS: Perfusion: Moderate decreased perfusion to a moderate size defect in the mid segment lateral wall which improves from stress to rest. Wall Motion: LEFT ventricular dilatation.  Mild global hypokinesia. Left Ventricular Ejection Fraction: 42 % End diastolic volume 993 ml End systolic volume 97 ml IMPRESSION: 1. Moderate size region of reversible ischemia in the mid segment lateral wall. 2. Global hypokinesia and LEFT  ventricular dilatation. 3. Left ventricular ejection fraction 42% 4. Non invasive risk stratification*: Intermediate *2012 Appropriate Use Criteria for Coronary Revascularization Focused Update: J Am Coll Cardiol. 5701;77(9):390-300. http://content.airportbarriers.com.aspx?articleid=[email or web address] These results will be called to the ordering clinician or representative by the Radiologist Assistant, and communication documented in the PACS or [name]. Electronically Signed   By: Suzy Bouchard M.D.   On: 12/02/2019 13:40   DG Abd 2 Views  Result Date: 12/07/2019 CLINICAL DATA:  Constipation, N/V today EXAM: ABDOMEN - 2 VIEW COMPARISON:  11/27/2019 FINDINGS: Visualized lung bases clear. No free air. Normal bowel gas pattern. No abnormal abdominal calcifications. Regional bones unremarkable. IMPRESSION:  Negative. Electronically Signed   By: Lucrezia Europe M.D.   On: 12/07/2019 08:15   ECHOCARDIOGRAM COMPLETE  Result Date: 11/30/2019    ECHOCARDIOGRAM REPORT   Patient Name:   EMMANUELL KANTZ Date of Exam: 11/30/2019 Medical Rec #:  [id number]    Height:       73.0 in Accession #:    [id number]   Weight:       262.3 lb Date of Birth:  Apr 23, 1975     BSA:          2.414 m Patient Age:    40 years     BP:           142/85 mmHg Patient Gender: M            HR:           92 bpm. Exam Location:  Inpatient Procedure: 2D Echo, Cardiac Doppler and Color Doppler Indications:    Chest pain  History:        Patient has no prior history of Echocardiogram examinations.                 Risk Factors:Former Smoker, Sleep Apnea and Hypertension. CKD.                 Polycystic kidney disease.  Sonographer:    Clayton Lefort RDCS (AE) Referring Phys: [id number] JESSICA Alison Stalling  Sonographer Comments: Global longitudinal strain was attempted. IMPRESSIONS  1. Left ventricular ejection fraction, by estimation, is 60 to 65%. The left ventricle has normal function. The left ventricle has no regional wall motion abnormalities. There is severe left  ventricular wall thickening. Wall thickness is 2.5 cm average,  concerning for infiltrative cardiomyopathy such as amyloidosis. Impaired diastolic function with indeterminate LV filling pressure.  2. The aortic valve is tricuspid. Aortic valve regurgitation is not visualized.  3. The pericardial effusion is localized near the right ventricle and circumferential.  4. The inferior vena cava is dilated in size with >50% respiratory variability, suggesting right atrial pressure of 8 mmHg.  5. Right ventricular systolic function is normal. The right ventricular size is normal. There is normal pulmonary artery systolic pressure. The estimated right ventricular systolic pressure is 44.3 mmHg.  6. The mitral valve is grossly normal. Trivial mitral valve regurgitation. Conclusion(s)/Recommendation(s): Consider further work-up for infiltrative cardiomyopathy or less likely HCM with cMRI or PYP scan. FINDINGS  Left Ventricle: Left ventricular ejection fraction, by estimation, is 60 to 65%. The left ventricle has normal function. The left ventricle has no regional wall motion abnormalities. The left ventricular internal cavity size was small. There is severe left ventricular hypertrophy. Left ventricular diastolic parameters are consistent with Grade I diastolic dysfunction (impaired relaxation). Indeterminate filling pressures. Right Ventricle: The right ventricular size is normal. No increase in right ventricular wall thickness. Right ventricular systolic function is normal. There is normal pulmonary artery systolic pressure. The tricuspid regurgitant velocity is 2.55 m/s, and  with an assumed right atrial pressure of 8 mmHg, the estimated right ventricular systolic pressure is 15.4 mmHg. Left Atrium: Left atrial size was normal in size. Right Atrium: Right atrial size was normal in size. Pericardium: Trivial pericardial effusion is present. The pericardial effusion is localized near the right ventricle and circumferential.  Mitral Valve: The mitral valve is grossly normal. Trivial mitral valve regurgitation. MV peak gradient, 3.0 mmHg. The mean mitral valve gradient is 1.0 mmHg. Tricuspid Valve: The tricuspid valve is grossly normal. Tricuspid valve regurgitation is trivial. Aortic Valve:  The aortic valve is tricuspid. Aortic valve regurgitation is not visualized. Aortic valve mean gradient measures 9.0 mmHg. Aortic valve peak gradient measures 15.4 mmHg. Pulmonic Valve: The pulmonic valve was grossly normal. Pulmonic valve regurgitation is trivial. Aorta: The aortic root and ascending aorta are structurally normal, with no evidence of dilitation. Venous: The inferior vena cava is dilated in size with greater than 50% respiratory variability, suggesting right atrial pressure of 8 mmHg. IAS/Shunts: No atrial level shunt detected by color flow Doppler.  LEFT VENTRICLE PLAX 2D LVIDd:         4.10 cm LVIDs:         2.70 cm LV PW:         2.60 cm LV IVS:        2.40 cm LVOT diam:     2.30 cm LVOT Area:     4.15 cm  RIGHT VENTRICLE          IVC RV Basal diam:  3.10 cm  IVC diam: 2.20 cm TAPSE (M-mode): 2.3 cm LEFT ATRIUM           Index       RIGHT ATRIUM           Index LA diam:      3.20 cm 1.33 cm/m  RA Area:     12.80 cm LA Vol (A4C): 52.0 ml 21.54 ml/m RA Volume:   26.00 ml  10.77 ml/m  AORTIC VALVE AV Vmax:      196.00 cm/s AV Vmean:     135.000 cm/s AV VTI:       0.312 m AV Peak Grad: 15.4 mmHg AV Mean Grad: 9.0 mmHg  AORTA Ao Root diam: 3.60 cm Ao Asc diam:  3.10 cm MITRAL VALVE               TRICUSPID VALVE MV Area (PHT): 2.34 cm    TR Peak grad:   26.0 mmHg MV Peak grad:  3.0 mmHg    TR Vmax:        255.00 cm/s MV Mean grad:  1.0 mmHg MV Vmax:       0.87 m/s    SHUNTS MV Vmean:      55.1 cm/s   Systemic Diam: 2.30 cm MV Decel Time: 324 msec MV E velocity: 67.90 cm/s MV A velocity: 56.20 cm/s MV E/A ratio:  1.21 Lyman Bishop MD Electronically signed by Lyman Bishop MD Signature Date/Time: 11/30/2019/4:11:47 PM    Final     VAS [place] UPPER EXT VEIN MAPPING (PRE-OP AVF)  Result Date: 11/27/2019 UPPER EXTREMITY VEIN MAPPING Performing Technologist: Griffin Basil RCT RDMS  Examination Guidelines: A complete evaluation includes B-mode imaging, spectral Doppler, color Doppler, and power Doppler as needed of all accessible portions of each vessel. Bilateral testing is considered an integral part of a complete examination. Limited examinations for reoccurring indications may be performed as noted. +-----------------+-------------+----------+---------+ Right Cephalic   Diameter (cm)Depth (cm)Findings  +-----------------+-------------+----------+---------+ Shoulder             0.15        2.13             +-----------------+-------------+----------+---------+ Prox upper arm       0.24        2.40             +-----------------+-------------+----------+---------+ Mid upper arm        0.24        1.30   branching +-----------------+-------------+----------+---------+ Dist upper arm  0.28        1.00             +-----------------+-------------+----------+---------+ Antecubital fossa    0.16        0.60   branching +-----------------+-------------+----------+---------+ Prox forearm         0.14        0.21             +-----------------+-------------+----------+---------+ Mid forearm          0.16        0.21             +-----------------+-------------+----------+---------+ Dist forearm         0.16        0.20             +-----------------+-------------+----------+---------+ Wrist                0.17        0.27             +-----------------+-------------+----------+---------+ +-----------------+-------------+----------+---------+ Right Basilic    Diameter (cm)Depth (cm)Findings  +-----------------+-------------+----------+---------+ Shoulder             0.57                         +-----------------+-------------+----------+---------+ Prox upper arm       0.56                          +-----------------+-------------+----------+---------+ Mid upper arm        0.56                         +-----------------+-------------+----------+---------+ Dist upper arm       0.69                         +-----------------+-------------+----------+---------+ Antecubital fossa    0.46               branching +-----------------+-------------+----------+---------+ Prox forearm         0.33               branching +-----------------+-------------+----------+---------+ Mid forearm          0.36                         +-----------------+-------------+----------+---------+ Distal forearm       0.23                         +-----------------+-------------+----------+---------+ Wrist                0.19                         +-----------------+-------------+----------+---------+ +-----------------+-------------+----------+---------+ Left Cephalic    Diameter (cm)Depth (cm)Findings  +-----------------+-------------+----------+---------+ Shoulder             0.24        1.90             +-----------------+-------------+----------+---------+ Prox upper arm       0.21        0.53             +-----------------+-------------+----------+---------+ Mid upper arm        0.23        0.50             +-----------------+-------------+----------+---------+  Dist upper arm       0.26        0.51   branching +-----------------+-------------+----------+---------+ Antecubital fossa    0.19        0.39             +-----------------+-------------+----------+---------+ Prox forearm         0.15        0.21             +-----------------+-------------+----------+---------+ Mid forearm          0.10        0.19             +-----------------+-------------+----------+---------+ Dist forearm         0.13        0.21             +-----------------+-------------+----------+---------+ Wrist                0.14        0.30              +-----------------+-------------+----------+---------+ +-----------------+-------------+----------+---------+ Left Basilic     Diameter (cm)Depth (cm)Findings  +-----------------+-------------+----------+---------+ Shoulder             0.52                         +-----------------+-------------+----------+---------+ Prox upper arm       0.34                         +-----------------+-------------+----------+---------+ Mid upper arm        0.35                         +-----------------+-------------+----------+---------+ Dist upper arm       0.39                         +-----------------+-------------+----------+---------+ Antecubital fossa    0.39               branching +-----------------+-------------+----------+---------+ Prox forearm         0.30                         +-----------------+-------------+----------+---------+ Mid forearm          0.26                         +-----------------+-------------+----------+---------+ Distal forearm       0.19                         +-----------------+-------------+----------+---------+ Wrist                0.25                         +-----------------+-------------+----------+---------+ *See table(s) above for measurements and observations.  Diagnosing physician: Monica Martinez MD Electronically signed by Monica Martinez MD on 11/27/2019 at 4:40:42 PM.    Final     Labs:  CBC: Recent Labs    11/30/19 0451 12/02/19 0600 12/07/19 0640 12/08/19 0454  WBC 9.7 9.5 9.4 8.2  HGB 11.4* 9.5* 10.2* 9.0*  HCT 36.6* 31.1* 32.9* 29.1*  PLT 302 257 282 267    COAGS: Recent Labs    12/08/19 0454  INR 1.3*  BMP: Recent Labs    11/30/19 0451 12/02/19 0600 12/07/19 0640 12/08/19 0454  NA 140 138 134* 142  K 4.8 4.6 4.2 4.5  CL 104 103 97* 104  CO2 21* 22 17* 20*  GLUCOSE 93 91 99 88  BUN 69* 75* 94* 94*  CALCIUM 7.8* 7.5* 8.4* 8.2*  CREATININE 8.12* 7.98* 8.15* 8.27*  GFRNONAA 7* 7*  7* 7*  GFRAA 8* 9* 8* 8*    LIVER FUNCTION TESTS: Recent Labs    11/24/19 1027 11/25/19 1848 12/07/19 0640 12/08/19 0454  BILITOT 0.3 0.5 0.7 0.4  AST 12 19 16  13*  ALT 17 19 15 12   ALKPHOS 56 46 41 36*  PROT 7.3 7.5 8.1 7.4  ALBUMIN 4.1 3.7 4.2 3.7    TUMOR MARKERS: No results for input(s): AFPTM, CEA, CA199, CHROMGRNA in the last 8760 hours.  Assessment and Plan:  CKD 5-- polycystic kidney disease Worsening CR Uremic Need to initiate dialysis per Nephrology Will keep npo for 9/22 procedure Risks and benefits discussed with the patient including, but not limited to bleeding, infection, vascular injury, pneumothorax which may require chest tube placement, air embolism or even death  All of the patient's questions were answered, patient is agreeable to proceed. Consent signed and in chart.  Thank you for this interesting consult.  I greatly enjoyed meeting Michael [name] and look forward to participating in their care.  A copy of this report was sent to the requesting provider on this date.  Electronically Signed: Lavonia Drafts, PA-C 12/08/2019, 1:53 PM   I spent a total of 20 Minutes    in face to face in clinical consultation, greater than 50% of which was counseling/coordinating care for tunneled HD cath placement

## 2019-12-09 ENCOUNTER — Encounter (HOSPITAL_COMMUNITY)
Admission: EM | Disposition: A | Discharge status: Home / Self Care | Payer: Self-pay | Source: Home / Self Care | Attending: Internal Medicine

## 2019-12-09 ENCOUNTER — Inpatient Hospital Stay (HOSPITAL_COMMUNITY): Payer: Medicaid Other

## 2019-12-09 HISTORY — PX: IR FLUORO GUIDE CV LINE RIGHT: IMG2283

## 2019-12-09 HISTORY — PX: LEFT HEART CATH AND CORONARY ANGIOGRAPHY: CATH118249

## 2019-12-09 HISTORY — PX: IR US GUIDE VASC ACCESS RIGHT: IMG2390

## 2019-12-09 LAB — BASIC METABOLIC PANEL
Anion gap: 13 (ref 5–15)
BUN: 89 mg/dL — ABNORMAL HIGH (ref 6–20)
CO2: 20 mmol/L — ABNORMAL LOW (ref 22–32)
Calcium: 8 mg/dL — ABNORMAL LOW (ref 8.9–10.3)
Chloride: 106 mmol/L (ref 98–111)
Creatinine, Ser: 8.15 mg/dL — ABNORMAL HIGH (ref 0.61–1.24)
GFR calc Af Amer: 8 mL/min — ABNORMAL LOW (ref >60)
GFR calc non Af Amer: 7 mL/min — ABNORMAL LOW (ref >60)
Glucose, Bld: 95 mg/dL (ref 70–99)
Potassium: 4.8 mmol/L (ref 3.5–5.1)
Sodium: 139 mmol/L (ref 135–145)

## 2019-12-09 SURGERY — LEFT HEART CATH AND CORONARY ANGIOGRAPHY
Anesthesia: LOCAL

## 2019-12-09 MED ORDER — SODIUM CHLORIDE 0.9 % IV SOLN: 250.0000 mL | INTRAVENOUS | Status: DC | PRN

## 2019-12-09 MED ORDER — MIDAZOLAM HCL 2 MG/2ML IJ SOLN
INTRAMUSCULAR | Status: AC
  Filled 2019-12-09: qty 2

## 2019-12-09 MED ORDER — HEPARIN SODIUM (PORCINE) 1000 UNIT/ML IJ SOLN
INTRAMUSCULAR | Status: AC
  Filled 2019-12-09: qty 1

## 2019-12-09 MED ORDER — HEPARIN (PORCINE) IN NACL 1000-0.9 UT/500ML-% IV SOLN
INTRAVENOUS | Status: AC
  Filled 2019-12-09: qty 500

## 2019-12-09 MED ORDER — HYDRALAZINE HCL 20 MG/ML IJ SOLN: 10.0000 mg | INTRAMUSCULAR | Status: AC | PRN

## 2019-12-09 MED ORDER — SODIUM CHLORIDE 0.9% FLUSH: 3.0000 mL | INTRAVENOUS | Status: DC | PRN

## 2019-12-09 MED ORDER — IOHEXOL 350 MG/ML SOLN
INTRAVENOUS | Status: DC | PRN
  Administered 2019-12-09: 30 mL

## 2019-12-09 MED ORDER — FENTANYL CITRATE (PF) 100 MCG/2ML IJ SOLN
INTRAMUSCULAR | Status: DC | PRN
Start: 2019-12-09 — End: 2019-12-09
  Administered 2019-12-09: 50 ug via INTRAVENOUS

## 2019-12-09 MED ORDER — MIDAZOLAM HCL 2 MG/2ML IJ SOLN
INTRAMUSCULAR | Status: DC | PRN
  Administered 2019-12-09: 2 mg via INTRAVENOUS

## 2019-12-09 MED ORDER — LIDOCAINE-EPINEPHRINE 1 %-1:100000 IJ SOLN
INTRAMUSCULAR | Status: AC | PRN
  Administered 2019-12-09: 10 mL

## 2019-12-09 MED ORDER — LIDOCAINE HCL (PF) 1 % IJ SOLN
INTRAMUSCULAR | Status: AC
  Filled 2019-12-09: qty 30

## 2019-12-09 MED ORDER — HYDROMORPHONE HCL 1 MG/ML IJ SOLN
0.5000 mg | INTRAMUSCULAR | Status: AC | PRN
  Administered 2019-12-09 – 2019-12-10 (×3): 1 mg via INTRAVENOUS
  Filled 2019-12-09 (×3): qty 1

## 2019-12-09 MED ORDER — MIDAZOLAM HCL 2 MG/2ML IJ SOLN
INTRAMUSCULAR | Status: AC | PRN
  Administered 2019-12-09 (×2): 1 mg via INTRAVENOUS

## 2019-12-09 MED ORDER — LIDOCAINE-EPINEPHRINE 1 %-1:100000 IJ SOLN
INTRAMUSCULAR | Status: AC
  Filled 2019-12-09: qty 1

## 2019-12-09 MED ORDER — FENTANYL CITRATE (PF) 100 MCG/2ML IJ SOLN
INTRAMUSCULAR | Status: AC | PRN
  Administered 2019-12-09: 50 ug via INTRAVENOUS
  Administered 2019-12-09: 25 ug via INTRAVENOUS

## 2019-12-09 MED ORDER — CEFAZOLIN SODIUM-DEXTROSE 2-4 GM/100ML-% IV SOLN
INTRAVENOUS | Status: AC
  Administered 2019-12-09: 2 g via INTRAVENOUS
  Filled 2019-12-09: qty 100

## 2019-12-09 MED ORDER — FENTANYL CITRATE (PF) 100 MCG/2ML IJ SOLN
INTRAMUSCULAR | Status: AC
  Filled 2019-12-09: qty 2

## 2019-12-09 MED ORDER — HEPARIN (PORCINE) IN NACL 1000-0.9 UT/500ML-% IV SOLN
INTRAVENOUS | Status: DC | PRN
  Administered 2019-12-09 (×2): 500 mL

## 2019-12-09 MED ORDER — SODIUM CHLORIDE 0.9% FLUSH
3.0000 mL | Freq: Two times a day (BID) | INTRAVENOUS | Status: DC
  Administered 2019-12-09 – 2019-12-14 (×5): 3 mL via INTRAVENOUS

## 2019-12-09 MED ORDER — LIDOCAINE HCL (PF) 1 % IJ SOLN
INTRAMUSCULAR | Status: DC | PRN
  Administered 2019-12-09: 19 mL

## 2019-12-09 MED ORDER — CHLORHEXIDINE GLUCONATE 4 % EX LIQD
CUTANEOUS | Status: AC
  Filled 2019-12-09: qty 15

## 2019-12-09 SURGICAL SUPPLY — 11 items
CATH INFINITI 5FR MPB2 (CATHETERS) ×1 IMPLANT
CATH INFINITI JR4 5F (CATHETERS) ×1 IMPLANT
DEVICE CLOSURE PERCLS PRGLD 6F (VASCULAR PRODUCTS) IMPLANT
KIT HEART LEFT (KITS) ×2 IMPLANT
KIT MICROPUNCTURE NIT STIFF (SHEATH) ×1 IMPLANT
PACK CARDIAC CATHETERIZATION (CUSTOM PROCEDURE TRAY) ×2 IMPLANT
PERCLOSE PROGLIDE 6F (VASCULAR PRODUCTS) ×2
SHEATH PINNACLE 5F 10CM (SHEATH) ×1 IMPLANT
SHEATH PROBE COVER 6X72 (BAG) ×1 IMPLANT
TRANSDUCER W/STOPCOCK (MISCELLANEOUS) ×2 IMPLANT
WIRE EMERALD 3MM-J .035X150CM (WIRE) ×1 IMPLANT

## 2019-12-09 NOTE — CV Procedure (Signed)
       Normal coronary arteries. Tortuous and suggests hypertensive heart disease. LVEF 70%   Adrian Prows, MD, [facility] 12/09/2019, 9:21 AM Office: [phone]

## 2019-12-09 NOTE — Plan of Care (Signed)
  Problem: Activity: Goal: Risk for activity intolerance will decrease Outcome: Progressing   

## 2019-12-09 NOTE — Progress Notes (Signed)
VASCULAR SURGERY:   The patient is scheduled for placement of a tunneled dialysis catheter today by interventional radiology.  He is also scheduled for cardiac catheterization today.  Pending the results of his cardiac catheterization I will tentatively schedule him for placement of a left basilic vein transposition on Friday.  Deitra Mayo, MD Office: [phone]

## 2019-12-09 NOTE — Interval H&P Note (Signed)
History and Physical Interval Note:  12/09/2019 8:43 AM  Michael [name]  has presented today for surgery, with the diagnosis of Chest Pain Abnormal Stress Test.  The various methods of treatment have been discussed with the patient and family. After consideration of risks, benefits and other options for treatment, the patient has consented to  Procedure(s): LEFT HEART CATH AND CORONARY ANGIOGRAPHY (N/A) and possible angioplasty as a surgical intervention.  The patient's history has been reviewed, patient examined, no change in status, stable for surgery.  I have reviewed the patient's chart and labs.  Questions were answered to the patient's satisfaction.  I have also discussed this morning with Dr. Kelly Splinter (Nephro).  Cath Lab Visit (complete for each Cath Lab visit)  Clinical Evaluation Leading to the Procedure:   ACS: No.  Non-ACS:    Anginal Classification: CCS III  Anti-ischemic medical therapy: Maximal Therapy (2 or more classes of medications)  Non-Invasive Test Results: Intermediate-risk stress test findings: cardiac mortality 1-3%/year  Prior CABG: No previous CABG Adrian Prows

## 2019-12-09 NOTE — Procedures (Signed)
Pre-procedure Diagnosis: ESRD Post-procedure Diagnosis: Same  Successful placement of tunneled HD catheter with tips terminating within the superior aspect of the right atrium.    Complications: None Immediate  EBL: Minimal   The catheter is ready for immediate use.   Jay Jevon Shells, MD Pager #: [phone]   

## 2019-12-09 NOTE — Progress Notes (Signed)
PROGRESS NOTE    Michael [name]  VZD:[id number] DOB: August 28, 1975 DOA: 12/07/2019 PCP: [name], FNP   Brief Narrative:  44 year old male with history of CKD-5 secondary to ADPKD followed by Dr. Hollie Salk, OSA on CPAP, HTN, recent hospitalization from 9/8-9/15 for hypertensive emergency and AKI on CKD-5 presenting with multiple complaints including chest pain, shortness of breath, diffuse abdominal pain, nausea, vomiting, constipation, paresthesia in hands and feet. The plan was to follow-up with nephrology and start HD in about 2 months but he never followed up. Echo at that time with EF of 60 to 65% and concern for infiltrative process. Cardiology consulted. He had a nuclear stress test that showed unchanged risk, and cardiology recommended medical management. In ED, afebrile. BP 174/106. On room air. Na 134. CO2 17. AG 20. Cr 8.15 (7.98 on 09/15). BUN 94. High-sensitivity troponin 40> 34. Hgb 10.2. Two view CXR and CTH without acute finding. Nephrology consulted. Plan is to start HD after HD cath placement.  Assessment & Plan:   Principal Problem:   AKI (acute kidney injury) ([place]) Active Problems:   Adult polycystic kidney disease   Essential hypertension, benign   Metabolic acidosis, increased anion gap   CKD (chronic kidney disease), stage V (HCC)   Uremia   Chest pain   AKI on CKD5 with azotemia and patient with history of ADPKD-likely due to uncontrolled hypertension Concurrent anion gap acidosis due to above:  - Anion gap metabolic acidosis-likely due to renal failure/uremia - Hyponatremia-likely due to renal failure. Resolved. - Nephrology following - defer for HD scheduling - dialysis cath placement planned later today   Atypical chest pain/elevated troponin: Likely type 2 demand ischemia from uncontrolled hypertension.  - EKG without acute ischemic finding.  - Patient previously had outpatient stress test recently that was intermediate risk. - Cardiology following: Reports  essentially normal coronary vessels on cardiac cath  Possible infiltrative cardiomyopathy/possible secondary to PHTN:  - Echo in 9/13 with EF of 60 to 65%,  2.34mm LV wall thickness concerning for infiltrative process and RVSP to 34. Does not appear fluid overloaded. No cardiopulmonary symptoms this morning. - HD for fluid management - Outpatient follow-up with cardiology, Dr. [name]  Uncontrolled hypertension/hypertensive emergency:  - Multifactorial including noncompliance, underlying sleep apnea and renal failure. BP improved. - Continue current regimens - Defer further adjustment to cardiology and nephrology  Chronic anemia of iron deficiency:  - Slight drop in hemoglobin but about baseline. Iron sat 9% on recent iron panel on 9/9. - Monitor H&H - IV iron and Aranesp per nephrology  Hyperlipidemia -Continue statin  OSA on CPAP -Nightly CPAP  Obesity: BMI 34.73 -Encourage lifestyle change to lose weight Estimated body mass index is 35.01 kg/m as calculated from the following:   Height as of this encounter: 6' (1.829 m).   Weight as of this encounter: 117.1 kg.   DVT prophylaxis: Heparin injection 5,000 Units Code Status: Full code Family Communication: Patient to update family. Unclear if he discussed renal status with his family yet.  Status is: Inpatient  Dispo: The patient is from: Home              Anticipated d/c is to: Home              Anticipated d/c date is: To be determined pending clinical course and possible need for dialysis bed clipping              Patient currently not medically stable for discharge  Consultants:  Cardiology, nephrology  Procedures:   Cardiac catheterization 12/09/2019  Dialysis catheter placement 12/09/2019  Hemodialysis tentatively planned to start 12/09/2019  Antimicrobials:  Not currently indicated  Subjective: No acute issues or events overnight, patient somewhat sleepy this morning indicates he feels very tired and  "wiped out, but otherwise denies nausea, vomiting, diarrhea, constipation, headache, fever, chills, chest pain, shortness of breath.  Objective: Vitals:   12/08/19 2200 12/08/19 2202 12/09/19 0532 12/09/19 0630  BP:  (!) 153/92 (!) 160/94   Pulse: 84 88 88   Resp: 18 16 16    Temp:  98.9 F (37.2 C) 98.4 F (36.9 C)   TempSrc:  Oral    SpO2: 97% 98% 96%   Weight:  117.1 kg    Height:    6' (1.829 m)    Intake/Output Summary (Last 24 hours) at 12/09/2019 0745 Last data filed at 12/09/2019 0600 Gross per 24 hour  Intake 240 ml  Output 200 ml  Net 40 ml   Filed Weights   12/08/19 2202  Weight: 117.1 kg    Examination:  General:  Pleasantly resting in bed, No acute distress. HEENT:  Normocephalic atraumatic.  Sclerae nonicteric, noninjected.  Extraocular movements intact bilaterally. Neck:  Without mass or deformity.  Trachea is midline. Lungs:  Clear to auscultate bilaterally without rhonchi, wheeze, or rales. Heart:  Regular rate and rhythm.  Without murmurs, rubs, or gallops. Abdomen:  Soft, nontender, nondistended.  Without guarding or rebound. Extremities: Without cyanosis, clubbing, edema, or obvious deformity. Vascular:  Dorsalis pedis and posterior tibial pulses palpable bilaterally. Skin:  Warm and dry, no erythema, no ulcerations.   Data Reviewed: I have personally reviewed following labs and imaging studies  CBC: Recent Labs  Lab 12/07/19 0640 12/08/19 0454  WBC 9.4 8.2  HGB 10.2* 9.0*  HCT 32.9* 29.1*  MCV 88.0 88.2  PLT 282 166   Basic Metabolic Panel: Recent Labs  Lab 12/07/19 0640 12/08/19 0454 12/09/19 0503  NA 134* 142 139  K 4.2 4.5 4.8  CL 97* 104 106  CO2 17* 20* 20*  GLUCOSE 99 88 95  BUN 94* 94* 89*  CREATININE 8.15* 8.27* 8.15*  CALCIUM 8.4* 8.2* 8.0*   GFR: Estimated Creatinine Clearance: 15.3 mL/min (A) (by C-G formula based on SCr of 8.15 mg/dL (H)). Liver Function Tests: Recent Labs  Lab 12/07/19 0640 12/08/19 0454  AST  16 13*  ALT 15 12  ALKPHOS 41 36*  BILITOT 0.7 0.4  PROT 8.1 7.4  ALBUMIN 4.2 3.7   Recent Labs  Lab 12/07/19 0640  LIPASE 48   No results for input(s): AMMONIA in the last 168 hours. Coagulation Profile: Recent Labs  Lab 12/08/19 0454  INR 1.3*   Cardiac Enzymes: No results for input(s): CKTOTAL, CKMB, CKMBINDEX, TROPONINI in the last 168 hours. BNP (last 3 results) No results for input(s): PROBNP in the last 8760 hours. HbA1C: No results for input(s): HGBA1C in the last 72 hours. CBG: No results for input(s): GLUCAP in the last 168 hours. Lipid Profile: No results for input(s): CHOL, HDL, LDLCALC, TRIG, CHOLHDL, LDLDIRECT in the last 72 hours. Thyroid Function Tests: No results for input(s): TSH, T4TOTAL, FREET4, T3FREE, THYROIDAB in the last 72 hours. Anemia Panel: No results for input(s): VITAMINB12, FOLATE, FERRITIN, TIBC, IRON, RETICCTPCT in the last 72 hours. Sepsis Labs: No results for input(s): PROCALCITON, LATICACIDVEN in the last 168 hours.  Recent Results (from the past 240 hour(s))  SARS Coronavirus 2 by RT PCR (hospital order, performed  in [place] lab) Nasopharyngeal Nasopharyngeal Swab     Status: None   Collection Time: 12/07/19  9:47 AM   Specimen: Nasopharyngeal Swab  Result Value Ref Range Status   SARS Coronavirus 2 NEGATIVE NEGATIVE Final    Comment: (NOTE) SARS-CoV-2 target nucleic acids are NOT DETECTED.  The SARS-CoV-2 RNA is generally detectable in upper and lower respiratory specimens during the acute phase of infection. The lowest concentration of SARS-CoV-2 viral copies this assay can detect is 250 copies / mL. A negative result does not preclude SARS-CoV-2 infection and should not be used as the sole basis for treatment or other patient management decisions.  A negative result may occur with improper specimen collection / handling, submission of specimen other than nasopharyngeal swab, presence of viral mutation(s) within  the areas targeted by this assay, and inadequate number of viral copies (<250 copies / mL). A negative result must be combined with clinical observations, patient history, and epidemiological information.  Fact Sheet for Patients:   StrictlyIdeas.no  Fact Sheet for Healthcare Providers: BankingDealers.co.za  This test is not yet approved or  cleared by the [place] FDA and has been authorized for detection and/or diagnosis of SARS-CoV-2 by FDA under an Emergency Use Authorization (EUA).  This EUA will remain in effect (meaning this test can be used) for the duration of the COVID-19 declaration under Section 564(b)(1) of the Act, 21 U.S.C. section 360bbb-3(b)(1), unless the authorization is terminated or revoked sooner.  Performed at [facility], [place] [address], [place], [place] [address]          Radiology Studies: CT Head Wo Contrast  Result Date: 12/07/2019 CLINICAL DATA:  Headache and fatigue. Upper and lower extremity tingling EXAM: CT HEAD WITHOUT CONTRAST TECHNIQUE: Contiguous axial images were obtained from the base of the skull through the vertex without intravenous contrast. COMPARISON:  None. FINDINGS: Brain: Ventricles are normal in size and configuration. There is symmetric frontal atrophy bilaterally. Sulci elsewhere appear normal. There is no intracranial mass hemorrhage, extra-axial fluid collection, or midline shift. Brain parenchyma appears unremarkable without evident acute infarct. Vascular: No hyperdense vessel. No appreciable vascular calcification. Skull: Bony calvarium appears intact. Sinuses/Orbits: There is mucosal thickening in the posterior right maxillary antrum. There is slight mucosal thickening in several ethmoid air cells. Orbits appear symmetric bilaterally. Other: Mastoid air cells are clear. IMPRESSION: Mild symmetric frontal atrophy bilaterally. Ventricles and sulci otherwise  appear normal. No mass or hemorrhage evident. No findings suggesting acute infarct. Mucosal thickening in several paranasal sinus regions noted. Electronically Signed   By: Lowella Grip III M.D.   On: 12/07/2019 09:31   DG Abd 2 Views  Result Date: 12/07/2019 CLINICAL DATA:  Constipation, N/V today EXAM: ABDOMEN - 2 VIEW COMPARISON:  11/27/2019 FINDINGS: Visualized lung bases clear. No free air. Normal bowel gas pattern. No abnormal abdominal calcifications. Regional bones unremarkable. IMPRESSION: Negative. Electronically Signed   By: Lucrezia Europe M.D.   On: 12/07/2019 08:15        Scheduled Meds: . amLODipine  10 mg Oral Daily  . aspirin EC  81 mg Oral Daily  . calcitRIOL  0.25 mcg Oral Daily  . calcium carbonate  1 tablet Oral TID  . carvedilol  25 mg Oral BID WC  . Chlorhexidine Gluconate Cloth  6 each Topical Q0600  . docusate sodium  100 mg Oral BID  . [START ON 12/10/2019] heparin  5,000 Units Subcutaneous Q8H  . hydrALAZINE  100 mg Oral Q8H  .  isosorbide dinitrate  20 mg Oral TID  . multivitamin with minerals  1 tablet Oral Daily  . rosuvastatin  10 mg Oral Daily  . sodium bicarbonate  650 mg Oral TID  . sodium chloride flush  3 mL Intravenous Q12H  . sodium chloride flush  3 mL Intravenous Q12H   Continuous Infusions: . sodium chloride    . sodium chloride    .  ceFAZolin (ANCEF) IV      LOS: 2 days   Time spent: 31min  Glenetta Kiger C Ketara Cavness, DO [facility]  If 7PM-7AM, please contact night-coverage [email or web address]  12/09/2019, 7:45 AM

## 2019-12-09 NOTE — Progress Notes (Signed)
[facility] Progress Note  Subjective: seen in room, had heart cath this am, cor's were wnl. No c/o today. Supposed to go for [facility] in IR shortly.   Vitals:   12/09/19 1000 12/09/19 1040 12/09/19 1128 12/09/19 1200  BP: (!) 153/76 137/83 139/87 (!) 161/88  Pulse: 73 72 80 79  Resp: 17 16 16 15   Temp:  (!) 97.5 F (36.4 C) 98 F (36.7 C) (!) 97.5 F (36.4 C)  TempSrc:  Oral Oral Oral  SpO2: 97% 98% 100% 99%  Weight:      Height:        Exam: Gen alert, WD WN no distress No jvd or bruits Chest clear bilat to bases RRR no MRG Abd soft ntnd no mass or ascites +bs GU normal male  MS no joint effusions or deformity Ext no leg or UE edema Neuro is alert, Ox 3 , nf NO asterixis    Home meds:  - norvasc 10/ coreg 12.5 bid/ hydralazine 100 tid/ isordil 20 tid/ sl ntg prn/ crestor 10 qd/ asa 81 qd  - zyloprim 100 bid/ sod bicarb 650 tid  - lokelma 10 gm bid  - prn's/ vitamins/ supplements    Nuclear Med 12/02/19 > IMPRESSION: 1. Moderate size region of reversible ischemia in the mid segment lateral wall. 2. Global hypokinesia and LEFT ventricular dilatation.  3. Left ventricular ejection fraction 42% 4. Non invasive risk stratification*: Intermediate     Na 134  K 4.2  CO2 17  BUN 94  Cr 8.15  eGFR 8  Alb 4.2  LFT's ok    WBC 9.4  Hb 10.2      UA hazy , large LE/ Hb, 100 prot, >50 rbc/ wbc, rare bact     CXR - IMPRESSION: No active cardiopulmonary disease.        Assessment/ Plan: 1. CKD V/ polycystic kidney disease - w/ suspected uremia and progressive azotemia. Needs to start on dialysis. Had heart cath this am, is going for [facility] in IR today and plan 1st HD tonight. Asked [facility] to see for permanent access. CLIP is pending.  2. Chest pain - LHC today, clean coronaries  3. HTN/ vol - getting all 3 home bp meds here, BP's okay 4. Anemia ckd - this is probably CKD related. Hb 10, no esa for now.        Rob Annete Ayuso 12/09/2019, 3:32 PM   Recent Labs   Lab 12/07/19 0640 12/07/19 0640 12/08/19 0454 12/09/19 0503  K 4.2   < > 4.5 4.8  BUN 94*   < > 94* 89*  CREATININE 8.15*   < > 8.27* 8.15*  CALCIUM 8.4*   < > 8.2* 8.0*  HGB 10.2*  --  9.0*  --    < > = values in this interval not displayed.   Inpatient medications: . amLODipine  10 mg Oral Daily  . aspirin EC  81 mg Oral Daily  . calcitRIOL  0.25 mcg Oral Daily  . calcium carbonate  1 tablet Oral TID  . carvedilol  25 mg Oral BID WC  . chlorhexidine      . Chlorhexidine Gluconate Cloth  6 each Topical Q0600  . docusate sodium  100 mg Oral BID  . fentaNYL      . [START ON 12/10/2019] heparin  5,000 Units Subcutaneous Q8H  . heparin sodium (porcine)      . heparin sodium (porcine)      . hydrALAZINE  100 mg  Oral Q8H  . isosorbide dinitrate  20 mg Oral TID  . lidocaine-EPINEPHrine      . lidocaine-EPINEPHrine      . midazolam      . multivitamin with minerals  1 tablet Oral Daily  . rosuvastatin  10 mg Oral Daily  . sodium bicarbonate  650 mg Oral TID  . sodium chloride flush  3 mL Intravenous Q12H  . sodium chloride flush  3 mL Intravenous Q12H  . sodium chloride flush  3 mL Intravenous Q12H   . sodium chloride     sodium chloride, acetaminophen **OR** acetaminophen, metoprolol tartrate, nitroGLYCERIN, polyethylene glycol, sodium chloride flush

## 2019-12-09 NOTE — Progress Notes (Signed)
Renal Navigator received notification from Dr. Schertz/Nephrologist to refer patient for ESRD. Navigator met with patient today to discuss. We spoke about the importance of compliance. Navigator will attempt to get a seat for patient at the closest clinic to his home to help with this. He states agreement. Navigator submitted referral to [facility] to request treatment at [place] [facility] clinic for treatment of ESRD.  Renal Navigator will follow closely. Navigator notes that patient has no insurance on file, but it appears he has met with a Development worker, community. Navigator will reach out to counselor as date of filing/pending Medicaid application is needed in order to get patient financially cleared. This may cause a delay in OP HD referral process.   Alphonzo Cruise, [place] Renal Navigator [phone]

## 2019-12-09 NOTE — Plan of Care (Signed)
  Problem: Health Behavior/Discharge Planning: °Goal: Ability to manage health-related needs will improve °Outcome: Progressing °  °Problem: Clinical Measurements: °Goal: Cardiovascular complication will be avoided °Outcome: Progressing °  °Problem: Coping: °Goal: Level of anxiety will decrease °Outcome: Progressing °  °

## 2019-12-09 NOTE — Progress Notes (Signed)
Aneta Mins, RN was called and notified that the pt HD has been moved to 12/10/19.

## 2019-12-09 NOTE — Progress Notes (Signed)
Patient to self-administer CPAP when ready for sleep.  Patient is familiar with equipment and procedure and wore machine last night without issue.

## 2019-12-09 NOTE — Plan of Care (Signed)
  Problem: Education: Goal: Knowledge of General Education information will improve Description Including pain rating scale, medication(s)/side effects and non-pharmacologic comfort measures Outcome: Progressing   

## 2019-12-10 ENCOUNTER — Encounter (HOSPITAL_COMMUNITY): Payer: Self-pay | Admitting: Cardiology

## 2019-12-10 LAB — RENAL FUNCTION PANEL
Albumin: 3.6 g/dL (ref 3.5–5.0)
Anion gap: 10 (ref 5–15)
BUN: 84 mg/dL — ABNORMAL HIGH (ref 6–20)
CO2: 22 mmol/L (ref 22–32)
Calcium: 8.1 mg/dL — ABNORMAL LOW (ref 8.9–10.3)
Chloride: 107 mmol/L (ref 98–111)
Creatinine, Ser: 7.93 mg/dL — ABNORMAL HIGH (ref 0.61–1.24)
GFR calc Af Amer: 9 mL/min — ABNORMAL LOW (ref >60)
GFR calc non Af Amer: 7 mL/min — ABNORMAL LOW (ref >60)
Glucose, Bld: 94 mg/dL (ref 70–99)
Phosphorus: 5.4 mg/dL — ABNORMAL HIGH (ref 2.5–4.6)
Potassium: 5.3 mmol/L — ABNORMAL HIGH (ref 3.5–5.1)
Sodium: 139 mmol/L (ref 135–145)

## 2019-12-10 LAB — CBC
HCT: 30 % — ABNORMAL LOW (ref 39.0–52.0)
Hemoglobin: 9 g/dL — ABNORMAL LOW (ref 13.0–17.0)
MCH: 26.2 pg (ref 26.0–34.0)
MCHC: 30 g/dL (ref 30.0–36.0)
MCV: 87.5 fL (ref 80.0–100.0)
Platelets: 265 10*3/uL (ref 150–400)
RBC: 3.43 MIL/uL — ABNORMAL LOW (ref 4.22–5.81)
RDW: 15 % (ref 11.5–15.5)
WBC: 10 10*3/uL (ref 4.0–10.5)
nRBC: 0 % (ref 0.0–0.2)

## 2019-12-10 MED ORDER — CEFAZOLIN SODIUM-DEXTROSE 2-4 GM/100ML-% IV SOLN
2.0000 g | INTRAVENOUS | Status: AC
  Filled 2019-12-10: qty 100

## 2019-12-10 MED ORDER — HEPARIN SODIUM (PORCINE) 1000 UNIT/ML IJ SOLN
INTRAMUSCULAR | Status: AC
  Administered 2019-12-10: 1000 [IU]
  Filled 2019-12-10: qty 4

## 2019-12-10 MED ORDER — HYDROCODONE-ACETAMINOPHEN 5-325 MG PO TABS
1.0000 | ORAL_TABLET | Freq: Four times a day (QID) | ORAL | Status: DC | PRN
  Administered 2019-12-10 – 2019-12-11 (×4): 1 via ORAL
  Filled 2019-12-10 (×4): qty 1

## 2019-12-10 NOTE — Plan of Care (Signed)
Nutrition Education Note  RD consulted for Renal Education. Pt unavailable at time of RD visit. Provided Renal Food Guide Pyramid to patient/family via discharge instructions. This reviews food groups and provides written recommended serving sizes specifically determined for patient's current nutritional status. Also provides list of foods to limit/avoid that are high in potassium, sodium, and phosphorus. Provides specific recommendations on safer alternatives of these foods. Also stresses importance of adequate protein intake. Will encourage pt to discuss specific diet questions/concerns with RD at HD outpatient facility and will attempt to discuss education with pt again at follow-up.   Expect fair compliance.  Body mass index is 35.37 kg/m. Pt meets criteria for obese based on current BMI.  Current diet order is renal, patient is consuming approximately 75-100% of meals at this time. Labs and medications reviewed. No further nutrition interventions warranted at this time. RD contact information provided. If additional nutrition issues arise, please re-consult RD.  Larkin Ina, MS, RD, LDN RD pager number and weekend/on-call pager number located in [place].

## 2019-12-10 NOTE — Progress Notes (Addendum)
   VASCULAR SURGERY ASSESSMENT & PLAN:   ESRD: Right IJ TDC placed yesterday. Plan left basilic vein transposition tomorrow with Dr. [name]. Vein mapping pending. Preop orders written.  Chest  Pain: Left heart cath 12/09/2019: Normal coronary arteries. LVEF 70%   SUBJECTIVE:   Seen in inpt HD unit. HD via [facility] without apparent complications. Some right chest soreness from cath placement.  PHYSICAL EXAM:   Vitals:   12/09/19 1730 12/09/19 1800 12/09/19 1925 12/10/19 0509  BP: (!) 163/72 (!) 154/74 (!) 172/86 (!) 153/97  Pulse: 86 75 95 100  Resp: 17 18 17 18   Temp: 98.5 F (36.9 C) 98.4 F (36.9 C) 98.6 F (37 C) 98 F (36.7 C)  TempSrc: Oral Oral Oral Oral  SpO2: 98% 99% 96% 95%  Weight:    118.3 kg  Height:       General: A and O times 4 CV: RRR Lungs: CTAB LUE: 3+ radial pulse Right groin: no hematoma  LABS:   Am labs pending CBG (last 3)  No results for input(s): GLUCAP in the last 72 hours.  PROBLEM LIST:    Principal Problem:   AKI (acute kidney injury) ([place]) Active Problems:   Adult polycystic kidney disease   Essential hypertension, benign   Metabolic acidosis, increased anion gap   CKD (chronic kidney disease), stage V (HCC)   Uremia   Chest pain   CURRENT MEDS:   . amLODipine  10 mg Oral Daily  . aspirin EC  81 mg Oral Daily  . calcitRIOL  0.25 mcg Oral Daily  . calcium carbonate  1 tablet Oral TID  . carvedilol  25 mg Oral BID WC  . Chlorhexidine Gluconate Cloth  6 each Topical Q0600  . docusate sodium  100 mg Oral BID  . heparin  5,000 Units Subcutaneous Q8H  . hydrALAZINE  100 mg Oral Q8H  . isosorbide dinitrate  20 mg Oral TID  . multivitamin with minerals  1 tablet Oral Daily  . rosuvastatin  10 mg Oral Daily  . sodium chloride flush  3 mL Intravenous Q12H   Risa Grill, PA-C Office: [phone] 12/10/2019   I have interviewed the patient and examined the patient. I agree with the findings by the PA.  Gae Gallop,  MD [phone]

## 2019-12-10 NOTE — Progress Notes (Signed)
Renal Navigator spoke with patient at HD bedside to see how first treatment is going. He states he is a little sore, but otherwise well. Navigator spoke with patient about staying in the hospital to get AVF/G during this hospitalization because of uninsured status. Medicaid will be retroactive, but Navigator unsure if he will be able to schedule surgery in the future until Medicaid is approved, which may be months. He stated understanding and reports that he has completed Medicaid and Disability applications already. Navigator notes that he is still listed as "not on file" as far as insurance status and has left Kenda Revels/financial counselor a message to see about a Medicaid pending date. Navigator also informed patient again that we will not be able to secure an OP HD seat for him until we have received financial clearance from [facility] due to uninsured status. This could delay securing his seat. Patient states he will do whatever is needed. Navigator attempting to push referral along as quickly as possible, but financial clearance will be needed.  Alphonzo Cruise, [place] Renal Navigator [phone]

## 2019-12-10 NOTE — Progress Notes (Signed)
Pt stated he can place self on cpap unit when ready for bed. 

## 2019-12-10 NOTE — Progress Notes (Signed)
[facility] Progress Note  Subjective: on HD, no new c/o  Vitals:   12/10/19 0900 12/10/19 0930 12/10/19 1000 12/10/19 1007  BP: (!) 169/108 (!) 162/99 (!) 182/94 (!) 170/96  Pulse: 81 73 83 85  Resp:      Temp:    97.8 F (36.6 C)  TempSrc:    Oral  SpO2:    98%  Weight:    117.3 kg  Height:        Exam: Gen alert, WD WN no distress No jvd or bruits Chest clear bilat to bases RRR no MRG Abd soft ntnd no mass or ascites +bs GU normal male  MS no joint effusions or deformity Ext no leg or UE edema Neuro is alert, Ox 3 , nf NO asterixis    Home meds:  - norvasc 10/ coreg 12.5 bid/ hydralazine 100 tid/ isordil 20 tid/ sl ntg prn/ crestor 10 qd/ asa 81 qd  - zyloprim 100 bid/ sod bicarb 650 tid  - lokelma 10 gm bid  - prn's/ vitamins/ supplements    Nuclear Med 12/02/19 > IMPRESSION: 1. Moderate size region of reversible ischemia in the mid segment lateral wall. 2. Global hypokinesia and LEFT ventricular dilatation.  3. Left ventricular ejection fraction 42% 4. Non invasive risk stratification*: Intermediate     Na 134  K 4.2  CO2 17  BUN 94  Cr 8.15  eGFR 8  Alb 4.2  LFT's ok    WBC 9.4  Hb 10.2      UA hazy , large LE/ Hb, 100 prot, >50 rbc/ wbc, rare bact     CXR - IMPRESSION: No active cardiopulmonary disease.        Assessment/ Plan: 1. CKD V/ polycystic kidney disease - w/ suspected uremia and progressive azotemia. Admitted for HD initiation. LHC yest was wnl. TDC in and 1st HD today. Plan HD again tomorrow or Sat. VVS planning for AVF tomorrow.  2. Chest pain - sp LHC,  clean coronaries  3. HTN/ vol - getting all 3 home bp meds here, BP's are up, will lower vol w/ next HD.  4. Anemia ckd - this is probably CKD related. Hb 10, no esa for now.        Michael [name] 12/10/2019, 1:56 PM   Recent Labs  Lab 12/08/19 0454 12/08/19 0454 12/09/19 0503 12/10/19 0822 12/10/19 0823  K 4.5   < > 4.8  --  5.3*  BUN 94*   < > 89*  --  84*   CREATININE 8.27*   < > 8.15*  --  7.93*  CALCIUM 8.2*   < > 8.0*  --  8.1*  PHOS  --   --   --   --  5.4*  HGB 9.0*  --   --  9.0*  --    < > = values in this interval not displayed.   Inpatient medications: . amLODipine  10 mg Oral Daily  . aspirin EC  81 mg Oral Daily  . calcitRIOL  0.25 mcg Oral Daily  . calcium carbonate  1 tablet Oral TID  . carvedilol  25 mg Oral BID WC  . Chlorhexidine Gluconate Cloth  6 each Topical Q0600  . docusate sodium  100 mg Oral BID  . heparin  5,000 Units Subcutaneous Q8H  . hydrALAZINE  100 mg Oral Q8H  . isosorbide dinitrate  20 mg Oral TID  . multivitamin with minerals  1 tablet Oral Daily  . rosuvastatin  10 mg Oral Daily  . sodium chloride flush  3 mL Intravenous Q12H   . sodium chloride    . [START ON 12/11/2019]  ceFAZolin (ANCEF) IV     sodium chloride, acetaminophen **OR** acetaminophen, HYDROcodone-acetaminophen, metoprolol tartrate, nitroGLYCERIN, polyethylene glycol, sodium chloride flush

## 2019-12-10 NOTE — Progress Notes (Addendum)
This RN rounding on patient secondary to new start HD patient. Patient with first HD treatment today. Tolerated well but with c/o pain to R neck and groin at this time. Patient with multiple concerns for today in regards to his food, pain, and stay in general. Patient requested the phone number to call in a complaint. This Glass blower/designer for his concerns and reached out to patients nurse and physician, Dr. Avon Gully, to express his concerns. At this time no further changes to patient's medications. MD to speak with patient. Department Director to round on patient . Handouts and booklet given to patient to read over with discussion of placement of AVG/AVF tomorrow. Patient also educated on the limitation of K and phosphorus and site care of his TJC. Will follow as appropriate.  Mady Gemma, RN Dialysis Program Coordinator [phone]

## 2019-12-10 NOTE — Progress Notes (Signed)
PROGRESS NOTE    Michael [name]  BDZ:[id number] DOB: 01/24/1976 DOA: 12/07/2019 PCP: [name], FNP   Brief Narrative:  44 year old male with history of CKD-5 secondary to ADPKD followed by Dr. Hollie Salk, OSA on CPAP, HTN, recent hospitalization from 9/8-9/15 for hypertensive emergency and AKI on CKD-5 presenting with multiple complaints including chest pain, shortness of breath, diffuse abdominal pain, nausea, vomiting, constipation, paresthesia in hands and feet. The plan was to follow-up with nephrology and start HD in about 2 months but he never followed up. Echo at that time with EF of 60 to 65% and concern for infiltrative process. Cardiology consulted. He had a nuclear stress test that showed unchanged risk, and cardiology recommended medical management. In ED, afebrile. BP 174/106. On room air. Na 134. CO2 17. AG 20. Cr 8.15 (7.98 on 09/15). BUN 94. High-sensitivity troponin 40> 34. Hgb 10.2. Two view CXR and CTH without acute finding. Nephrology consulted. Plan is to start HD after HD cath placement.  Assessment & Plan:   Principal Problem:   AKI (acute kidney injury) ([place]) Active Problems:   Adult polycystic kidney disease   Essential hypertension, benign   Metabolic acidosis, increased anion gap   CKD (chronic kidney disease), stage V (HCC)   Uremia   Chest pain   AKI on CKD5 with azotemia and patient with history of ADPKD-likely due to uncontrolled hypertension progressing to ESRD, POA Concurrent anion gap acidosis due to above:  - Anion gap metabolic acidosis-likely due to renal failure/uremia, resolving - Nephrology following - defer for HD scheduling - dialysis cath placed 9/22 -Tentative plan for left basilic vein transposition 9/24 with Dr. [name].  - Vein mapping pending  Atypical chest pain/elevated troponin: Likely type 2 demand ischemia from uncontrolled hypertension.  - EKG without acute ischemic finding.  - Patient previously had outpatient stress test  recently that was intermediate risk. - Cardiology following: Reports essentially normal coronary vessels on cardiac cath  Possible infiltrative cardiomyopathy/possible secondary to PHTN:  - Echo in 9/13 with EF of 60 to 65%,  2.74mm LV wall thickness concerning for infiltrative process and RVSP to 34. Does not appear fluid overloaded. No cardiopulmonary symptoms this morning. - HD for fluid management - Outpatient follow-up with cardiology, Dr. [name]  Uncontrolled hypertension/hypertensive emergency, resolving:  - Multifactorial including noncompliance, underlying sleep apnea and renal failure. BP improved. - Continue current regimen: Amlodipine, carvedilol, hydralazine, isosorbide - Defer further adjustment to cardiology and nephrology  Chronic anemia of iron deficiency:  - Slight drop in hemoglobin but about baseline. Iron sat 9% on recent iron panel on 9/9. - Monitor H&H - IV iron and Aranesp per nephrology  Hyperlipidemia -Continue statin  OSA on CPAP -Nightly CPAP  Obesity: BMI 34.73 -Encourage lifestyle change to lose weight Estimated body mass index is 35.36 kg/m as calculated from the following:   Height as of this encounter: 6' (1.829 m).   Weight as of this encounter: 118.3 kg.   DVT prophylaxis: Heparin injection 5,000 Units Code Status: Full code Family Communication: Patient to update family.  Status is: Inpatient  Dispo: The patient is from: Home              Anticipated d/c is to: Home              Anticipated d/c date is: To be determined pending clinical course and need for dialysis bed clipping              Patient currently not medically stable for  discharge  Consultants:   Cardiology, nephrology  Procedures:   Cardiac catheterization 12/09/2019  Dialysis catheter placement 12/09/2019  Hemodialysis tentatively planned to start 12/09/2019  Antimicrobials:  Not currently indicated  Subjective: No acute issues or events overnight, currently  tolerating dialysis quite well; denies vomiting, diarrhea, constipation, headache, fever, chills, chest pain, shortness of breath.  Objective: Vitals:   12/09/19 1730 12/09/19 1800 12/09/19 1925 12/10/19 0509  BP: (!) 163/72 (!) 154/74 (!) 172/86 (!) 153/97  Pulse: 86 75 95 100  Resp: _0 Temp: 98.5 F (36.9 C) 98.4 F (36.9 C) 98.6 F (37 C) 98 F (36.7 C)  TempSrc: Oral Oral Oral Oral  SpO2: 98% 99% 96% 95%  Weight:    118.3 kg  Height:        Intake/Output Summary (Last 24 hours) at 12/10/2019 0824 Last data filed at 12/10/2019 0600 Gross per 24 hour  Intake 940 ml  Output 1000 ml  Net -60 ml   Filed Weights   12/08/19 2202 12/10/19 0509  Weight: 117.1 kg 118.3 kg    Examination:  General:  Pleasantly resting in bed receiving dialysis, No acute distress. HEENT:  Normocephalic atraumatic.  Sclerae nonicteric, noninjected.  Extraocular movements intact bilaterally. Neck:  Without mass or deformity.  Trachea is midline.  Right IJ dialysis catheter bandage and site clean dry intact. Lungs:  Clear to auscultate bilaterally without rhonchi, wheeze, or rales. Heart:  Regular rate and rhythm.  Without murmurs, rubs, or gallops. Abdomen:  Soft, nontender, nondistended.  Without guarding or rebound. Extremities: Without cyanosis, clubbing, edema, or obvious deformity. Vascular:  Dorsalis pedis and posterior tibial pulses palpable bilaterally. Skin:  Warm and dry, no erythema, no ulcerations.   Data Reviewed: I have personally reviewed following labs and imaging studies  CBC: Recent Labs  Lab 12/07/19 0640 12/08/19 0454  WBC 9.4 8.2  HGB 10.2* 9.0*  HCT 32.9* 29.1*  MCV 88.0 88.2  PLT 282 884   Basic Metabolic Panel: Recent Labs  Lab 12/07/19 0640 12/08/19 0454 12/09/19 0503  NA 134* 142 139  K 4.2 4.5 4.8  CL 97* 104 106  CO2 17* 20* 20*  GLUCOSE 99 88 95  BUN 94* 94* 89*  CREATININE 8.15* 8.27* 8.15*  CALCIUM 8.4* 8.2* 8.0*   GFR: Estimated  Creatinine Clearance: 15.4 mL/min (A) (by C-G formula based on SCr of 8.15 mg/dL (H)). Liver Function Tests: Recent Labs  Lab 12/07/19 0640 12/08/19 0454  AST 16 13*  ALT 15 12  ALKPHOS 41 36*  BILITOT 0.7 0.4  PROT 8.1 7.4  ALBUMIN 4.2 3.7   Recent Labs  Lab 12/07/19 0640  LIPASE 48   No results for input(s): AMMONIA in the last 168 hours. Coagulation Profile: Recent Labs  Lab 12/08/19 0454  INR 1.3*   Cardiac Enzymes: No results for input(s): CKTOTAL, CKMB, CKMBINDEX, TROPONINI in the last 168 hours. BNP (last 3 results) No results for input(s): PROBNP in the last 8760 hours. HbA1C: No results for input(s): HGBA1C in the last 72 hours. CBG: No results for input(s): GLUCAP in the last 168 hours. Lipid Profile: No results for input(s): CHOL, HDL, LDLCALC, TRIG, CHOLHDL, LDLDIRECT in the last 72 hours. Thyroid Function Tests: No results for input(s): TSH, T4TOTAL, FREET4, T3FREE, THYROIDAB in the last 72 hours. Anemia Panel: No results for input(s): VITAMINB12, FOLATE, FERRITIN, TIBC, IRON, RETICCTPCT in the last 72 hours. Sepsis Labs: No results for input(s): PROCALCITON, LATICACIDVEN in the last 168 hours.  Recent Results (from the past 240 hour(s))  SARS Coronavirus 2 by RT PCR (hospital order, performed in [facility] hospital lab) Nasopharyngeal Nasopharyngeal Swab     Status: None   Collection Time: 12/07/19  9:47 AM   Specimen: Nasopharyngeal Swab  Result Value Ref Range Status   SARS Coronavirus 2 NEGATIVE NEGATIVE Final    Comment: (NOTE) SARS-CoV-2 target nucleic acids are NOT DETECTED.  The SARS-CoV-2 RNA is generally detectable in upper and lower respiratory specimens during the acute phase of infection. The lowest concentration of SARS-CoV-2 viral copies this assay can detect is 250 copies / mL. A negative result does not preclude SARS-CoV-2 infection and should not be used as the sole basis for treatment or other patient management decisions.  A  negative result may occur with improper specimen collection / handling, submission of specimen other than nasopharyngeal swab, presence of viral mutation(s) within the areas targeted by this assay, and inadequate number of viral copies (<250 copies / mL). A negative result must be combined with clinical observations, patient history, and epidemiological information.  Fact Sheet for Patients:   StrictlyIdeas.no  Fact Sheet for Healthcare Providers: BankingDealers.co.za  This test is not yet approved or  cleared by the [place] FDA and has been authorized for detection and/or diagnosis of SARS-CoV-2 by FDA under an Emergency Use Authorization (EUA).  This EUA will remain in effect (meaning this test can be used) for the duration of the COVID-19 declaration under Section 564(b)(1) of the Act, 21 U.S.C. section 360bbb-3(b)(1), unless the authorization is terminated or revoked sooner.  Performed at [facility], [place] [address], [place],  [address]          Radiology Studies: CARDIAC CATHETERIZATION  Result Date: 12/09/2019 Left Heart Catheterization 12/09/19: Normal coronary arteries, tortuous vessels suggestive of hypertension with hypertensive heart disease.  Hyperdynamic LVEF at 70%.  Moderately elevated LVEDP.  30 mL contrast utilized.  Right femoral arterial access closed with Perclose. Recommendation: Evaluation for noncardiac chest pain, chest pain probably related to hypertension and also musculoskeletal chest pain.  Abnormal EKG related to hypertension. Adrian Prows, MD, [facility] 12/09/2019, 9:30 AM Office: [phone]   IR Fluoro Guide CV Line Right  Result Date: 12/09/2019 INDICATION: End-stage renal disease. In need of durable intravenous access for the initiation hemodialysis. EXAM: TUNNELED CENTRAL VENOUS HEMODIALYSIS CATHETER PLACEMENT WITH ULTRASOUND AND FLUOROSCOPIC GUIDANCE MEDICATIONS: Ancef 2 gm  IV . The antibiotic was given in an appropriate time interval prior to skin puncture. ANESTHESIA/SEDATION: Moderate (conscious) sedation was employed during this procedure. A total of Versed 2 mg and Fentanyl 50 mcg was administered intravenously. Moderate Sedation Time: 14 minutes. The patient's level of consciousness and vital signs were monitored continuously by radiology nursing throughout the procedure under my direct supervision. FLUOROSCOPY TIME:  54 seconds (41.7 mGy) COMPLICATIONS: None immediate. PROCEDURE: Informed written consent was obtained from the patient after a discussion of the risks, benefits, and alternatives to treatment. Questions regarding the procedure were encouraged and answered. The right neck and chest were prepped with chlorhexidine in a sterile fashion, and a sterile drape was applied covering the operative field. Maximum barrier sterile technique with sterile gowns and gloves were used for the procedure. A timeout was performed prior to the initiation of the procedure. After creating a small venotomy incision, a micropuncture kit was utilized to access the internal jugular vein. Real-time ultrasound guidance was utilized for vascular access including the acquisition of a permanent ultrasound image documenting patency of the  accessed vessel. The microwire was utilized to measure appropriate catheter length. A stiff Glidewire was advanced to the level of the IVC and the micropuncture sheath was exchanged for a peel-away sheath. A palindrome tunneled hemodialysis catheter measuring 23 cm from tip to cuff was tunneled in a retrograde fashion from the anterior chest wall to the venotomy incision. The catheter was then placed through the peel-away sheath with tips ultimately positioned within the superior aspect of the right atrium. Final catheter positioning was confirmed and documented with a spot radiographic image. The catheter aspirates and flushes normally. The catheter was flushed  with appropriate volume heparin dwells. The catheter exit site was secured with a 0-Prolene retention suture. The venotomy incision was closed with Dermabond and Steri-strips. Dressings were applied. The patient tolerated the procedure well without immediate post procedural complication. IMPRESSION: Successful placement of 23 cm tip to cuff tunneled hemodialysis catheter via the right internal jugular vein with tips terminating within the superior aspect of the right atrium. The catheter is ready for immediate use. Electronically Signed   By: Sandi Mariscal M.D.   On: 12/09/2019 17:12   IR US Guide Vasc Access Right  Result Date: 12/09/2019 INDICATION: End-stage renal disease. In need of durable intravenous access for the initiation hemodialysis. EXAM: TUNNELED CENTRAL VENOUS HEMODIALYSIS CATHETER PLACEMENT WITH ULTRASOUND AND FLUOROSCOPIC GUIDANCE MEDICATIONS: Ancef 2 gm IV . The antibiotic was given in an appropriate time interval prior to skin puncture. ANESTHESIA/SEDATION: Moderate (conscious) sedation was employed during this procedure. A total of Versed 2 mg and Fentanyl 50 mcg was administered intravenously. Moderate Sedation Time: 14 minutes. The patient's level of consciousness and vital signs were monitored continuously by radiology nursing throughout the procedure under my direct supervision. FLUOROSCOPY TIME:  54 seconds (81.1 mGy) COMPLICATIONS: None immediate. PROCEDURE: Informed written consent was obtained from the patient after a discussion of the risks, benefits, and alternatives to treatment. Questions regarding the procedure were encouraged and answered. The right neck and chest were prepped with chlorhexidine in a sterile fashion, and a sterile drape was applied covering the operative field. Maximum barrier sterile technique with sterile gowns and gloves were used for the procedure. A timeout was performed prior to the initiation of the procedure. After creating a small venotomy incision, a  micropuncture kit was utilized to access the internal jugular vein. Real-time ultrasound guidance was utilized for vascular access including the acquisition of a permanent ultrasound image documenting patency of the accessed vessel. The microwire was utilized to measure appropriate catheter length. A stiff Glidewire was advanced to the level of the IVC and the micropuncture sheath was exchanged for a peel-away sheath. A palindrome tunneled hemodialysis catheter measuring 23 cm from tip to cuff was tunneled in a retrograde fashion from the anterior chest wall to the venotomy incision. The catheter was then placed through the peel-away sheath with tips ultimately positioned within the superior aspect of the right atrium. Final catheter positioning was confirmed and documented with a spot radiographic image. The catheter aspirates and flushes normally. The catheter was flushed with appropriate volume heparin dwells. The catheter exit site was secured with a 0-Prolene retention suture. The venotomy incision was closed with Dermabond and Steri-strips. Dressings were applied. The patient tolerated the procedure well without immediate post procedural complication. IMPRESSION: Successful placement of 23 cm tip to cuff tunneled hemodialysis catheter via the right internal jugular vein with tips terminating within the superior aspect of the right atrium. The catheter is ready for immediate use. Electronically Signed  By: Sandi Mariscal M.D.   On: 12/09/2019 17:12        Scheduled Meds: . amLODipine  10 mg Oral Daily  . aspirin EC  81 mg Oral Daily  . calcitRIOL  0.25 mcg Oral Daily  . calcium carbonate  1 tablet Oral TID  . carvedilol  25 mg Oral BID WC  . Chlorhexidine Gluconate Cloth  6 each Topical Q0600  . docusate sodium  100 mg Oral BID  . heparin  5,000 Units Subcutaneous Q8H  . hydrALAZINE  100 mg Oral Q8H  . isosorbide dinitrate  20 mg Oral TID  . multivitamin with minerals  1 tablet Oral Daily  .  rosuvastatin  10 mg Oral Daily  . sodium chloride flush  3 mL Intravenous Q12H   Continuous Infusions: . sodium chloride    . [START ON 12/11/2019]  ceFAZolin (ANCEF) IV      LOS: 3 days   Time spent: 11mn  Kennard Fildes C Aldona Bryner, DO [facility]  If 7PM-7AM, please contact night-coverage [email or web address]  12/10/2019, 8:24 AM

## 2019-12-10 NOTE — Discharge Instructions (Signed)
° °  Please feel free to call the Clinical Nutrition office at [phone] with any questions. You will also have the opportunity to work closely with a dietitian at the outpatient dialysis center who can answer any remaining questions or questions that arise.

## 2019-12-11 ENCOUNTER — Encounter (HOSPITAL_COMMUNITY): Payer: Self-pay

## 2019-12-11 ENCOUNTER — Encounter (HOSPITAL_COMMUNITY): Payer: Self-pay | Admitting: Internal Medicine

## 2019-12-11 ENCOUNTER — Inpatient Hospital Stay (HOSPITAL_COMMUNITY): Payer: Medicaid Other | Admitting: Certified Registered"

## 2019-12-11 ENCOUNTER — Encounter (HOSPITAL_COMMUNITY)
Admission: EM | Disposition: A | Discharge status: Home / Self Care | Payer: Self-pay | Source: Home / Self Care | Attending: Internal Medicine

## 2019-12-11 DIAGNOSIS — N185 Chronic kidney disease, stage 5: Secondary | ICD-10-CM

## 2019-12-11 DIAGNOSIS — N186 End stage renal disease: Secondary | ICD-10-CM

## 2019-12-11 HISTORY — DX: End stage renal disease: N18.6

## 2019-12-11 HISTORY — PX: BASCILIC VEIN TRANSPOSITION: SHX5742

## 2019-12-11 LAB — CBC
HCT: 29.7 % — ABNORMAL LOW (ref 39.0–52.0)
Hemoglobin: 9.1 g/dL — ABNORMAL LOW (ref 13.0–17.0)
MCH: 26.8 pg (ref 26.0–34.0)
MCHC: 30.6 g/dL (ref 30.0–36.0)
MCV: 87.6 fL (ref 80.0–100.0)
Platelets: 254 10*3/uL (ref 150–400)
RBC: 3.39 MIL/uL — ABNORMAL LOW (ref 4.22–5.81)
RDW: 14.7 % (ref 11.5–15.5)
WBC: 10 10*3/uL (ref 4.0–10.5)
nRBC: 0 % (ref 0.0–0.2)

## 2019-12-11 LAB — RENAL FUNCTION PANEL
Albumin: 3.6 g/dL (ref 3.5–5.0)
Anion gap: 13 (ref 5–15)
BUN: 63 mg/dL — ABNORMAL HIGH (ref 6–20)
CO2: 23 mmol/L (ref 22–32)
Calcium: 8.2 mg/dL — ABNORMAL LOW (ref 8.9–10.3)
Chloride: 101 mmol/L (ref 98–111)
Creatinine, Ser: 7.23 mg/dL — ABNORMAL HIGH (ref 0.61–1.24)
GFR calc Af Amer: 10 mL/min — ABNORMAL LOW (ref >60)
GFR calc non Af Amer: 8 mL/min — ABNORMAL LOW (ref >60)
Glucose, Bld: 105 mg/dL — ABNORMAL HIGH (ref 70–99)
Phosphorus: 6.2 mg/dL — ABNORMAL HIGH (ref 2.5–4.6)
Potassium: 4.6 mmol/L (ref 3.5–5.1)
Sodium: 137 mmol/L (ref 135–145)

## 2019-12-11 LAB — SURGICAL PCR SCREEN
MRSA, PCR: NEGATIVE
Staphylococcus aureus: NEGATIVE

## 2019-12-11 SURGERY — TRANSPOSITION, VEIN, BASILIC
Anesthesia: Monitor Anesthesia Care | Site: Arm Upper | Laterality: Left

## 2019-12-11 MED ORDER — OXYCODONE-ACETAMINOPHEN 5-325 MG PO TABS
ORAL_TABLET | ORAL | Status: AC
  Filled 2019-12-11: qty 1

## 2019-12-11 MED ORDER — CHLORHEXIDINE GLUCONATE CLOTH 2 % EX PADS
6.0000 | MEDICATED_PAD | Freq: Every day | CUTANEOUS | Status: DC
  Administered 2019-12-12 – 2019-12-14 (×3): 6 via TOPICAL

## 2019-12-11 MED ORDER — PROPOFOL 10 MG/ML IV BOLUS
INTRAVENOUS | Status: DC | PRN
  Administered 2019-12-11: 25 mg via INTRAVENOUS

## 2019-12-11 MED ORDER — PROMETHAZINE HCL 25 MG/ML IJ SOLN: 6.2500 mg | INTRAMUSCULAR | Status: DC | PRN

## 2019-12-11 MED ORDER — CHLORHEXIDINE GLUCONATE 0.12 % MT SOLN: 15.0000 mL | Freq: Once | OROMUCOSAL | Status: AC

## 2019-12-11 MED ORDER — SODIUM CHLORIDE 0.9 % IV SOLN
INTRAVENOUS | Status: AC
  Filled 2019-12-11: qty 1.2

## 2019-12-11 MED ORDER — OXYCODONE-ACETAMINOPHEN 5-325 MG PO TABS
1.0000 | ORAL_TABLET | ORAL | Status: DC | PRN
  Administered 2019-12-11: 2 via ORAL
  Administered 2019-12-11: 1 via ORAL
  Administered 2019-12-11 – 2019-12-12 (×2): 2 via ORAL
  Filled 2019-12-11 (×4): qty 2

## 2019-12-11 MED ORDER — HYDROCODONE-ACETAMINOPHEN 5-325 MG PO TABS
1.0000 | ORAL_TABLET | Freq: Four times a day (QID) | ORAL | Status: DC | PRN
  Administered 2019-12-11: 1 via ORAL
  Filled 2019-12-11: qty 1

## 2019-12-11 MED ORDER — SODIUM CHLORIDE 0.9 % IV SOLN
INTRAVENOUS | Status: DC | PRN
  Administered 2019-12-11: 500 mL

## 2019-12-11 MED ORDER — ONDANSETRON HCL 4 MG/2ML IJ SOLN
INTRAMUSCULAR | Status: DC | PRN
  Administered 2019-12-11: 4 mg via INTRAVENOUS

## 2019-12-11 MED ORDER — PROPOFOL 500 MG/50ML IV EMUL
INTRAVENOUS | Status: DC | PRN
  Administered 2019-12-11: 100 ug/kg/min via INTRAVENOUS

## 2019-12-11 MED ORDER — CHLORHEXIDINE GLUCONATE 0.12 % MT SOLN
OROMUCOSAL | Status: AC
  Administered 2019-12-11: 15 mL via OROMUCOSAL
  Filled 2019-12-11: qty 15

## 2019-12-11 MED ORDER — 0.9 % SODIUM CHLORIDE (POUR BTL) OPTIME
TOPICAL | Status: DC | PRN
  Administered 2019-12-11: 1000 mL

## 2019-12-11 MED ORDER — OXYCODONE HCL 5 MG/5ML PO SOLN: 5.0000 mg | Freq: Once | ORAL | Status: DC | PRN

## 2019-12-11 MED ORDER — LIDOCAINE-EPINEPHRINE (PF) 1 %-1:200000 IJ SOLN
INTRAMUSCULAR | Status: DC | PRN
  Administered 2019-12-11: 30 mL

## 2019-12-11 MED ORDER — HYDROMORPHONE HCL 1 MG/ML IJ SOLN: 0.2500 mg | INTRAMUSCULAR | Status: DC | PRN

## 2019-12-11 MED ORDER — PROPOFOL 10 MG/ML IV BOLUS
INTRAVENOUS | Status: AC
  Filled 2019-12-11: qty 20

## 2019-12-11 MED ORDER — FENTANYL CITRATE (PF) 250 MCG/5ML IJ SOLN
INTRAMUSCULAR | Status: AC
  Filled 2019-12-11: qty 5

## 2019-12-11 MED ORDER — HYDROMORPHONE HCL 1 MG/ML IJ SOLN
INTRAMUSCULAR | Status: AC
  Filled 2019-12-11: qty 1

## 2019-12-11 MED ORDER — EPHEDRINE SULFATE-NACL 50-0.9 MG/10ML-% IV SOSY
PREFILLED_SYRINGE | INTRAVENOUS | Status: DC | PRN
  Administered 2019-12-11: 10 mg via INTRAVENOUS

## 2019-12-11 MED ORDER — OXYCODONE HCL 5 MG PO TABS: 5.0000 mg | ORAL_TABLET | Freq: Once | ORAL | Status: DC | PRN

## 2019-12-11 MED ORDER — SODIUM CHLORIDE 0.9 % IV SOLN
125.0000 mg | INTRAVENOUS | Status: DC
  Administered 2019-12-14: 125 mg via INTRAVENOUS
  Filled 2019-12-11 (×2): qty 10

## 2019-12-11 MED ORDER — PROTAMINE SULFATE 10 MG/ML IV SOLN
INTRAVENOUS | Status: DC | PRN
  Administered 2019-12-11: 50 mg via INTRAVENOUS

## 2019-12-11 MED ORDER — MIDAZOLAM HCL 5 MG/5ML IJ SOLN
INTRAMUSCULAR | Status: DC | PRN
  Administered 2019-12-11: 1 mg via INTRAVENOUS

## 2019-12-11 MED ORDER — LIDOCAINE HCL (PF) 1 % IJ SOLN
INTRAMUSCULAR | Status: AC
  Filled 2019-12-11: qty 30

## 2019-12-11 MED ORDER — HEPARIN SODIUM (PORCINE) 1000 UNIT/ML IJ SOLN
INTRAMUSCULAR | Status: DC | PRN
  Administered 2019-12-11: 10000 [IU] via INTRAVENOUS

## 2019-12-11 MED ORDER — SODIUM CHLORIDE 0.9% FLUSH: 10.0000 mL | Freq: Two times a day (BID) | INTRAVENOUS | Status: DC

## 2019-12-11 MED ORDER — ACETAMINOPHEN 325 MG PO TABS: 650.0000 mg | ORAL_TABLET | Freq: Every day | ORAL | Status: DC

## 2019-12-11 MED ORDER — ORAL CARE MOUTH RINSE: 15.0000 mL | Freq: Once | OROMUCOSAL | Status: AC

## 2019-12-11 MED ORDER — SODIUM CHLORIDE 0.9 % IV SOLN: INTRAVENOUS | Status: DC

## 2019-12-11 MED ORDER — HEPARIN SODIUM (PORCINE) 1000 UNIT/ML IJ SOLN
INTRAMUSCULAR | Status: AC
  Administered 2019-12-11: 1000 [IU] via ARTERIOVENOUS_FISTULA
  Filled 2019-12-11: qty 4

## 2019-12-11 MED ORDER — LIDOCAINE-EPINEPHRINE (PF) 1 %-1:200000 IJ SOLN
INTRAMUSCULAR | Status: AC
  Filled 2019-12-11: qty 30

## 2019-12-11 MED ORDER — OXYCODONE-ACETAMINOPHEN 5-325 MG PO TABS
2.0000 | ORAL_TABLET | Freq: Once | ORAL | Status: AC | PRN
  Administered 2019-12-11: 2 via ORAL
  Filled 2019-12-11: qty 2

## 2019-12-11 MED ORDER — HEPARIN SODIUM (PORCINE) 1000 UNIT/ML IJ SOLN
INTRAMUSCULAR | Status: AC
  Filled 2019-12-11: qty 2

## 2019-12-11 MED ORDER — SODIUM CHLORIDE 0.9% FLUSH: 10.0000 mL | INTRAVENOUS | Status: DC | PRN

## 2019-12-11 MED ORDER — HYDROMORPHONE HCL 1 MG/ML IJ SOLN
INTRAMUSCULAR | Status: DC | PRN
  Administered 2019-12-11 (×2): .25 mg via INTRAVENOUS

## 2019-12-11 MED ORDER — MIDAZOLAM HCL 2 MG/2ML IJ SOLN
INTRAMUSCULAR | Status: AC
  Filled 2019-12-11: qty 2

## 2019-12-11 MED ORDER — HEPARIN SODIUM (PORCINE) 1000 UNIT/ML IJ SOLN: 1500.0000 [IU] | Freq: Once | INTRAMUSCULAR | Status: AC

## 2019-12-11 MED ORDER — PHENYLEPHRINE 40 MCG/ML (10ML) SYRINGE FOR IV PUSH (FOR BLOOD PRESSURE SUPPORT)
PREFILLED_SYRINGE | INTRAVENOUS | Status: DC | PRN
  Administered 2019-12-11 (×4): 120 ug via INTRAVENOUS

## 2019-12-11 SURGICAL SUPPLY — 34 items
ADH SKN CLS APL DERMABOND .7 (GAUZE/BANDAGES/DRESSINGS) ×1
ARMBAND PINK RESTRICT EXTREMIT (MISCELLANEOUS) ×2 IMPLANT
BAG DECANTER FOR FLEXI CONT (MISCELLANEOUS) ×2 IMPLANT
CANISTER SUCT 3000ML PPV (MISCELLANEOUS) ×2 IMPLANT
CANNULA VESSEL 3MM 2 BLNT TIP (CANNULA) ×2 IMPLANT
CLIP VESOCCLUDE MED 24/CT (CLIP) ×2 IMPLANT
CLIP VESOCCLUDE SM WIDE 24/CT (CLIP) ×2 IMPLANT
COVER PROBE W GEL 5X96 (DRAPES) IMPLANT
DECANTER SPIKE VIAL GLASS SM (MISCELLANEOUS) ×2 IMPLANT
DERMABOND ADVANCED (GAUZE/BANDAGES/DRESSINGS) ×1
DERMABOND ADVANCED .7 DNX12 (GAUZE/BANDAGES/DRESSINGS) ×1 IMPLANT
ELECT REM PT RETURN 9FT ADLT (ELECTROSURGICAL) ×2
ELECTRODE REM PT RTRN 9FT ADLT (ELECTROSURGICAL) ×1 IMPLANT
GLOVE BIO SURGEON STRL SZ 6.5 (GLOVE) ×2 IMPLANT
GLOVE BIO SURGEON STRL SZ7.5 (GLOVE) ×2 IMPLANT
GLOVE BIOGEL PI IND STRL 8 (GLOVE) ×1 IMPLANT
GLOVE BIOGEL PI INDICATOR 8 (GLOVE) ×1
GLOVE ECLIPSE 6.5 STRL STRAW (GLOVE) ×2 IMPLANT
GOWN STRL REUS W/ TWL LRG LVL3 (GOWN DISPOSABLE) ×3 IMPLANT
GOWN STRL REUS W/TWL LRG LVL3 (GOWN DISPOSABLE) ×6
KIT BASIN OR (CUSTOM PROCEDURE TRAY) ×2 IMPLANT
KIT TURNOVER KIT B (KITS) ×2 IMPLANT
NS IRRIG 1000ML POUR BTL (IV SOLUTION) ×2 IMPLANT
PACK CV ACCESS (CUSTOM PROCEDURE TRAY) ×2 IMPLANT
PAD ARMBOARD 7.5X6 YLW CONV (MISCELLANEOUS) ×4 IMPLANT
SUT ETHILON 5 0 CL P 3 (SUTURE) ×2 IMPLANT
SUT PROLENE 6 0 BV (SUTURE) ×4 IMPLANT
SUT SILK 2 0 SH (SUTURE) IMPLANT
SUT VIC AB 3-0 SH 27 (SUTURE) ×4
SUT VIC AB 3-0 SH 27X BRD (SUTURE) ×2 IMPLANT
SUT VICRYL 4-0 PS2 18IN ABS (SUTURE) ×4 IMPLANT
TOWEL GREEN STERILE (TOWEL DISPOSABLE) ×2 IMPLANT
UNDERPAD 30X36 HEAVY ABSORB (UNDERPADS AND DIAPERS) ×2 IMPLANT
WATER STERILE IRR 1000ML POUR (IV SOLUTION) ×2 IMPLANT

## 2019-12-11 NOTE — Progress Notes (Signed)
Patient complains of swelling and pain at AVF placement site. RN assessed site and it does appear to be more swollen. Bruit present, but no thrill felt. [name], MD notified and made aware. [name], MD stated that he would come up to assess the patient. PRN pain medication given as ordered. Left arm also elevated on pillows to help with swelling. Will continue to monitor.

## 2019-12-11 NOTE — Anesthesia Procedure Notes (Signed)
Procedure Name: MAC Date/Time: 12/11/2019 8:40 AM Performed by: Imagene Riches, CRNA Pre-anesthesia Checklist: Patient identified, Emergency Drugs available, Suction available, Patient being monitored and Timeout performed Oxygen Delivery Method: Simple face mask

## 2019-12-11 NOTE — Progress Notes (Signed)
PROGRESS NOTE    Michael [name]  GQQ:[id number] DOB: [birth year] DOA: 12/07/2019 PCP: [name], FNP   Brief Narrative:  44 year old male with history of CKD-5 secondary to ADPKD followed by Dr. Hollie Salk, OSA on CPAP, HTN, recent hospitalization from 9/8-9/15 for hypertensive emergency and AKI on CKD-5 presenting with multiple complaints including chest pain, shortness of breath, diffuse abdominal pain, nausea, vomiting, constipation, paresthesia in hands and feet. The plan was to follow-up with nephrology and start HD in about 2 months but he never followed up. Echo at that time with EF of 60 to 65% and concern for infiltrative process. Cardiology consulted. He had a nuclear stress test that showed unchanged risk, and cardiology recommended medical management. In ED, afebrile. BP 174/106. On room air. Na 134. CO2 17. AG 20. Cr 8.15 (7.98 on 09/15). BUN 94. High-sensitivity troponin 40> 34. Hgb 10.2. Two view CXR and CTH without acute finding. Nephrology consulted. Plan is to start HD after HD cath placement.  Assessment & Plan:   Principal Problem:   AKI (acute kidney injury) ([place]) Active Problems:   Adult polycystic kidney disease   Essential hypertension, benign   Metabolic acidosis, increased anion gap   CKD (chronic kidney disease), stage V (HCC)   Uremia   Chest pain  AKI on CKD5 with azotemia and patient with history of ADPKD-likely due to uncontrolled hypertension progressing to ESRD, POA Concurrent anion gap acidosis due to above:  - Anion gap metabolic acidosis-likely due to renal failure/uremia, resolving - Nephrology following - defer for HD scheduling - dialysis cath placed 9/22 - First stage AV basilic transposition graft in the left upper extremity on 9/24 with Dr. [name].  - Vein mapping pending  Possible infiltrative cardiomyopathy/possible secondary to PHTN:  - Echo in 9/13 with EF of 60 to 65%,  2.64m LV wall thickness concerning for infiltrative process and RVSP  to 34. Does not appear fluid overloaded. No cardiopulmonary symptoms this morning. - HD for fluid management - Outpatient follow-up with cardiology, Dr. GEinar Gip Uncontrolled hypertension/hypertensive emergency, resolving:  - Multifactorial including noncompliance, underlying sleep apnea and renal failure. BP improved. - Continue current regimen: Amlodipine, carvedilol, hydralazine, isosorbide - Defer further adjustment to cardiology and nephrology  Atypical chest pain/elevated troponin: Likely type 2 demand ischemia from uncontrolled hypertension, transient, resolved  - EKG without acute ischemic finding.  - Patient previously had outpatient stress test recently that was intermediate risk. - Cardiology following: Reports essentially normal coronary vessels on cardiac cath  Chronic anemia of iron deficiency and likely chronic disease:  - Slight drop in hemoglobin but about baseline. Iron sat 9% on recent iron panel on 9/9. - Monitor H&H - IV iron and Aranesp per nephrology  Hyperlipidemia -Continue statin  OSA on CPAP -Nightly CPAP  Obesity: BMI 34.73 -Encourage lifestyle change to lose weight Estimated body mass index is 35.07 kg/m as calculated from the following:   Height as of this encounter: 6' (1.829 m).   Weight as of this encounter: 117.3 kg.   DVT prophylaxis: Heparin injection 5,000 Units Code Status: Full code Family Communication:  None present, patient to update family.  Status is: Inpatient  Dispo: The patient is from: Home              Anticipated d/c is to: Home              Anticipated d/c date is: To be determined pending clinical course and need for dialysis bed clipping -likely additional 48 to 72  hours              Patient currently not medically stable for discharge  Consultants:   Cardiology, nephrology  Procedures:   Cardiac catheterization 12/09/2019  Dialysis catheter placement 12/09/2019  Hemodialysis initiated 6/72/0947  Stage I  basilic vein transposition for AV graft 12/11/2019  Antimicrobials:  Not currently indicated  Subjective: No acute issues or events overnight, tolerated AV fistula surgery without complication, pain currently well controlled denies nausea, vomiting, diarrhea, constipation, headache, fevers, chills.  Objective: Vitals:   12/10/19 2053 12/11/19 0626 12/11/19 0805 12/11/19 0809  BP: 130/73 (!) 153/91 (!) 164/89   Pulse: 76 78 92   Resp: 17 18    Temp: 99.2 F (37.3 C) 98.6 F (37 C)    TempSrc: Oral Oral    SpO2: 95% 99%    Weight: 117.3 kg   117.3 kg  Height:    6' (1.829 m)    Intake/Output Summary (Last 24 hours) at 12/11/2019 0820 Last data filed at 12/11/2019 0626 Gross per 24 hour  Intake 0 ml  Output 1100 ml  Net -1100 ml   Filed Weights   12/10/19 1007 12/10/19 2053 12/11/19 0809  Weight: 117.3 kg 117.3 kg 117.3 kg    Examination:  General:  Pleasantly resting in bed receiving dialysis, No acute distress. HEENT:  Normocephalic atraumatic.  Sclerae nonicteric, noninjected.  Extraocular movements intact bilaterally. Neck:  Without mass or deformity.  Trachea is midline.  Right IJ dialysis catheter bandage and site clean dry intact. Lungs:  Clear to auscultate bilaterally without rhonchi, wheeze, or rales. Heart:  Regular rate and rhythm.  Without murmurs, rubs, or gallops. Abdomen:  Soft, nontender, nondistended.  Without guarding or rebound. Extremities: Without cyanosis, clubbing, edema, or obvious deformity.  Left upper extremity medial aspect 2 small 4 cm incisions, bandage clean dry intact. Vascular:  Dorsalis pedis and posterior tibial pulses palpable bilaterally. Skin:  Warm and dry, no erythema, no ulcerations.   Data Reviewed: I have personally reviewed following labs and imaging studies  CBC: Recent Labs  Lab 12/07/19 0640 12/08/19 0454 12/10/19 0822  WBC 9.4 8.2 10.0  HGB 10.2* 9.0* 9.0*  HCT 32.9* 29.1* 30.0*  MCV 88.0 88.2 87.5  PLT 282 267 096    Basic Metabolic Panel: Recent Labs  Lab 12/07/19 0640 12/08/19 0454 12/09/19 0503 12/10/19 0823  NA 134* 142 139 139  K 4.2 4.5 4.8 5.3*  CL 97* 104 106 107  CO2 17* 20* 20* 22  GLUCOSE 99 88 95 94  BUN 94* 94* 89* 84*  CREATININE 8.15* 8.27* 8.15* 7.93*  CALCIUM 8.4* 8.2* 8.0* 8.1*  PHOS  --   --   --  5.4*   GFR: Estimated Creatinine Clearance: 15.7 mL/min (A) (by C-G formula based on SCr of 7.93 mg/dL (H)). Liver Function Tests: Recent Labs  Lab 12/07/19 0640 12/08/19 0454 12/10/19 0823  AST 16 13*  --   ALT 15 12  --   ALKPHOS 41 36*  --   BILITOT 0.7 0.4  --   PROT 8.1 7.4  --   ALBUMIN 4.2 3.7 3.6   Recent Labs  Lab 12/07/19 0640  LIPASE 48   No results for input(s): AMMONIA in the last 168 hours. Coagulation Profile: Recent Labs  Lab 12/08/19 0454  INR 1.3*   Cardiac Enzymes: No results for input(s): CKTOTAL, CKMB, CKMBINDEX, TROPONINI in the last 168 hours. BNP (last 3 results) No results for input(s): PROBNP in the last  8760 hours. HbA1C: No results for input(s): HGBA1C in the last 72 hours. CBG: No results for input(s): GLUCAP in the last 168 hours. Lipid Profile: No results for input(s): CHOL, HDL, LDLCALC, TRIG, CHOLHDL, LDLDIRECT in the last 72 hours. Thyroid Function Tests: No results for input(s): TSH, T4TOTAL, FREET4, T3FREE, THYROIDAB in the last 72 hours. Anemia Panel: No results for input(s): VITAMINB12, FOLATE, FERRITIN, TIBC, IRON, RETICCTPCT in the last 72 hours. Sepsis Labs: No results for input(s): PROCALCITON, LATICACIDVEN in the last 168 hours.  Recent Results (from the past 240 hour(s))  SARS Coronavirus 2 by RT PCR (hospital order, performed in [facility] hospital lab) Nasopharyngeal Nasopharyngeal Swab     Status: None   Collection Time: 12/07/19  9:47 AM   Specimen: Nasopharyngeal Swab  Result Value Ref Range Status   SARS Coronavirus 2 NEGATIVE NEGATIVE Final    Comment: (NOTE) SARS-CoV-2 target nucleic acids are  NOT DETECTED.  The SARS-CoV-2 RNA is generally detectable in upper and lower respiratory specimens during the acute phase of infection. The lowest concentration of SARS-CoV-2 viral copies this assay can detect is 250 copies / mL. A negative result does not preclude SARS-CoV-2 infection and should not be used as the sole basis for treatment or other patient management decisions.  A negative result may occur with improper specimen collection / handling, submission of specimen other than nasopharyngeal swab, presence of viral mutation(s) within the areas targeted by this assay, and inadequate number of viral copies (<250 copies / mL). A negative result must be combined with clinical observations, patient history, and epidemiological information.  Fact Sheet for Patients:   StrictlyIdeas.no  Fact Sheet for Healthcare Providers: BankingDealers.co.za  This test is not yet approved or  cleared by the [place] FDA and has been authorized for detection and/or diagnosis of SARS-CoV-2 by FDA under an Emergency Use Authorization (EUA).  This EUA will remain in effect (meaning this test can be used) for the duration of the COVID-19 declaration under Section 564(b)(1) of the Act, 21 U.S.C. section 360bbb-3(b)(1), unless the authorization is terminated or revoked sooner.  Performed at [facility], [place] [address] [place], [place] [address]   Surgical pcr screen     Status: None   Collection Time: 12/11/19  6:25 AM   Specimen: Nasal Mucosa; Nasal Swab  Result Value Ref Range Status   MRSA, PCR NEGATIVE NEGATIVE Final   Staphylococcus aureus NEGATIVE NEGATIVE Final    Comment: (NOTE) The Xpert SA Assay (FDA approved for NASAL specimens in patients 51 years of age and older), is one component of a comprehensive surveillance program. It is not intended to diagnose infection nor to guide or monitor treatment. Performed at  [facility], [place] [address], [place], [place] [address]          Radiology Studies: CARDIAC CATHETERIZATION  Result Date: 12/09/2019 Left Heart Catheterization 12/09/19: Normal coronary arteries, tortuous vessels suggestive of hypertension with hypertensive heart disease.  Hyperdynamic LVEF at 70%.  Moderately elevated LVEDP.  30 mL contrast utilized.  Right femoral arterial access closed with Perclose. Recommendation: Evaluation for noncardiac chest pain, chest pain probably related to hypertension and also musculoskeletal chest pain.  Abnormal EKG related to hypertension. Adrian Prows, MD, [facility] 12/09/2019, 9:30 AM Office: [phone]   IR Fluoro Guide CV Line Right  Result Date: 12/09/2019 INDICATION: End-stage renal disease. In need of durable intravenous access for the initiation hemodialysis. EXAM: TUNNELED CENTRAL VENOUS HEMODIALYSIS CATHETER PLACEMENT WITH ULTRASOUND AND FLUOROSCOPIC  GUIDANCE MEDICATIONS: Ancef 2 gm IV . The antibiotic was given in an appropriate time interval prior to skin puncture. ANESTHESIA/SEDATION: Moderate (conscious) sedation was employed during this procedure. A total of Versed 2 mg and Fentanyl 50 mcg was administered intravenously. Moderate Sedation Time: 14 minutes. The patient's level of consciousness and vital signs were monitored continuously by radiology nursing throughout the procedure under my direct supervision. FLUOROSCOPY TIME:  54 seconds (81.8 mGy) COMPLICATIONS: None immediate. PROCEDURE: Informed written consent was obtained from the patient after a discussion of the risks, benefits, and alternatives to treatment. Questions regarding the procedure were encouraged and answered. The right neck and chest were prepped with chlorhexidine in a sterile fashion, and a sterile drape was applied covering the operative field. Maximum barrier sterile technique with sterile gowns and gloves were used for the procedure. A timeout was performed prior to the  initiation of the procedure. After creating a small venotomy incision, a micropuncture kit was utilized to access the internal jugular vein. Real-time ultrasound guidance was utilized for vascular access including the acquisition of a permanent ultrasound image documenting patency of the accessed vessel. The microwire was utilized to measure appropriate catheter length. A stiff Glidewire was advanced to the level of the IVC and the micropuncture sheath was exchanged for a peel-away sheath. A palindrome tunneled hemodialysis catheter measuring 23 cm from tip to cuff was tunneled in a retrograde fashion from the anterior chest wall to the venotomy incision. The catheter was then placed through the peel-away sheath with tips ultimately positioned within the superior aspect of the right atrium. Final catheter positioning was confirmed and documented with a spot radiographic image. The catheter aspirates and flushes normally. The catheter was flushed with appropriate volume heparin dwells. The catheter exit site was secured with a 0-Prolene retention suture. The venotomy incision was closed with Dermabond and Steri-strips. Dressings were applied. The patient tolerated the procedure well without immediate post procedural complication. IMPRESSION: Successful placement of 23 cm tip to cuff tunneled hemodialysis catheter via the right internal jugular vein with tips terminating within the superior aspect of the right atrium. The catheter is ready for immediate use. Electronically Signed   By: Sandi Mariscal M.D.   On: 12/09/2019 17:12   IR US Guide Vasc Access Right  Result Date: 12/09/2019 INDICATION: End-stage renal disease. In need of durable intravenous access for the initiation hemodialysis. EXAM: TUNNELED CENTRAL VENOUS HEMODIALYSIS CATHETER PLACEMENT WITH ULTRASOUND AND FLUOROSCOPIC GUIDANCE MEDICATIONS: Ancef 2 gm IV . The antibiotic was given in an appropriate time interval prior to skin puncture.  ANESTHESIA/SEDATION: Moderate (conscious) sedation was employed during this procedure. A total of Versed 2 mg and Fentanyl 50 mcg was administered intravenously. Moderate Sedation Time: 14 minutes. The patient's level of consciousness and vital signs were monitored continuously by radiology nursing throughout the procedure under my direct supervision. FLUOROSCOPY TIME:  54 seconds (29.9 mGy) COMPLICATIONS: None immediate. PROCEDURE: Informed written consent was obtained from the patient after a discussion of the risks, benefits, and alternatives to treatment. Questions regarding the procedure were encouraged and answered. The right neck and chest were prepped with chlorhexidine in a sterile fashion, and a sterile drape was applied covering the operative field. Maximum barrier sterile technique with sterile gowns and gloves were used for the procedure. A timeout was performed prior to the initiation of the procedure. After creating a small venotomy incision, a micropuncture kit was utilized to access the internal jugular vein. Real-time ultrasound guidance was utilized for vascular access  including the acquisition of a permanent ultrasound image documenting patency of the accessed vessel. The microwire was utilized to measure appropriate catheter length. A stiff Glidewire was advanced to the level of the IVC and the micropuncture sheath was exchanged for a peel-away sheath. A palindrome tunneled hemodialysis catheter measuring 23 cm from tip to cuff was tunneled in a retrograde fashion from the anterior chest wall to the venotomy incision. The catheter was then placed through the peel-away sheath with tips ultimately positioned within the superior aspect of the right atrium. Final catheter positioning was confirmed and documented with a spot radiographic image. The catheter aspirates and flushes normally. The catheter was flushed with appropriate volume heparin dwells. The catheter exit site was secured with a  0-Prolene retention suture. The venotomy incision was closed with Dermabond and Steri-strips. Dressings were applied. The patient tolerated the procedure well without immediate post procedural complication. IMPRESSION: Successful placement of 23 cm tip to cuff tunneled hemodialysis catheter via the right internal jugular vein with tips terminating within the superior aspect of the right atrium. The catheter is ready for immediate use. Electronically Signed   By: Sandi Mariscal M.D.   On: 12/09/2019 17:12        Scheduled Meds: . [MAR Hold] amLODipine  10 mg Oral Daily  . [MAR Hold] aspirin EC  81 mg Oral Daily  . [MAR Hold] calcitRIOL  0.25 mcg Oral Daily  . [MAR Hold] calcium carbonate  1 tablet Oral TID  . [MAR Hold] carvedilol  25 mg Oral BID WC  . [MAR Hold] Chlorhexidine Gluconate Cloth  6 each Topical Q0600  . [MAR Hold] docusate sodium  100 mg Oral BID  . [MAR Hold] heparin  5,000 Units Subcutaneous Q8H  . [MAR Hold] hydrALAZINE  100 mg Oral Q8H  . [MAR Hold] isosorbide dinitrate  20 mg Oral TID  . [MAR Hold] multivitamin with minerals  1 tablet Oral Daily  . [MAR Hold] rosuvastatin  10 mg Oral Daily  . [MAR Hold] sodium chloride flush  3 mL Intravenous Q12H   Continuous Infusions: . [MAR Hold] sodium chloride    . sodium chloride    . [MAR Hold]  ceFAZolin (ANCEF) IV      LOS: 4 days   Time spent: 52mn  Ashika Apuzzo C Adley Castello, DO [facility]  If 7PM-7AM, please contact night-coverage [email or web address]  12/11/2019, 8:20 AM

## 2019-12-11 NOTE — Anesthesia Postprocedure Evaluation (Signed)
Anesthesia Post Note  Patient: [name]  Procedure(s) Performed: 1ST STAGE BASILIC TRANSPOSITION OF LEFT ARM (Left Arm Upper)     Patient location during evaluation: PACU Anesthesia Type: MAC Level of consciousness: awake and alert Pain management: pain level controlled Vital Signs Assessment: post-procedure vital signs reviewed and stable Respiratory status: spontaneous breathing, nonlabored ventilation and respiratory function stable Cardiovascular status: blood pressure returned to baseline and stable Postop Assessment: no apparent nausea or vomiting Anesthetic complications: no   No complications documented.  Last Vitals:  Vitals:   12/11/19 1041 12/11/19 1111  BP: (!) 164/72 123/86  Pulse: 72 86  Resp: 16 17  Temp:  (!) 36.4 C  SpO2: 98% 98%    Last Pain:  Vitals:   12/11/19 1111  TempSrc: Oral  PainSc:                  Michael [name]

## 2019-12-11 NOTE — Op Note (Signed)
    NAME: Michael [name]    MRN: [id number] DOB: March 19, 1976    DATE OF OPERATION: 12/11/2019  PREOP DIAGNOSIS:    End-stage renal disease  POSTOP DIAGNOSIS:    Same  PROCEDURE:    First stage left basilic vein transposition  SURGEON: Judeth Cornfield. [name], MD  ASSIST: Olin Pia, RNFA  ANESTHESIA: Local with sedation  EBL: Minimal  INDICATIONS:    Michael [name] is a 44 y.o. male who presents for new access.  FINDINGS:   3.5 mm basilic vein  TECHNIQUE:   The patient was taken to the operating room and sedated by anesthesia.  The left arm was prepped and draped in usual sterile fashion.  I looked at the basilic vein with the SonoSite it was marginal in size.  After the skin was anesthetized and made a longitudinal incision over the basilic vein just above the antecubital level.  Here the vein was dissected free I chased the medial branch which was larger.  A separate incision was made over the brachial artery and also to allow me to further dissected out the vein.  Branches were divided between clips and 3-0 silk ties.  The vein was ligated distally and then irrigated up with heparinized saline.  It was about a 3.5 mm vein.  The brachial artery was dissected free beneath the fascia.  The patient was heparinized.  The brachial artery was clamped proximally and distally and a longitudinal arteriotomy was made.  The vein was spatulated and sewn end-to-side to the artery using a continuous 6-0 Prolene suture.  At the completion there was a good thrill in the fistula and a good radial and ulnar signal with the Doppler.  Heparin was partially reversed with protamine.  Each of the wounds was closed with 2 deep layers of 3-0 Vicryl and the skin closed with 4-0 Vicryl.  Dermabond was applied.  The patient tolerated the procedure well and was transferred to the recovery room in stable condition.  All needle and sponge counts were correct.  Given the complexity of the case a first assistant  was necessary in order to expedient the procedure and safely perform the technical aspects of the operation.  Deitra Mayo, MD, FACS [facility]  DATE OF DICTATION:   12/11/2019

## 2019-12-11 NOTE — Anesthesia Preprocedure Evaluation (Signed)
Anesthesia Evaluation  Patient identified by MRN, date of birth, ID band Patient awake    Reviewed: Allergy & Precautions, NPO status , Patient's Chart, lab work & pertinent test results, reviewed documented beta blocker date and time   Airway Mallampati: II  TM Distance: >3 FB Neck ROM: Full    Dental no notable dental hx.    Pulmonary sleep apnea , former smoker,    Pulmonary exam normal breath sounds clear to auscultation       Cardiovascular hypertension, Pt. on medications and Pt. on home beta blockers negative cardio ROS Normal cardiovascular exam Rhythm:Regular Rate:Normal     Neuro/Psych negative neurological ROS  negative psych ROS   GI/Hepatic negative GI ROS, Neg liver ROS,   Endo/Other  negative endocrine ROS  Renal/GU ESRF and DialysisRenal disease  negative genitourinary   Musculoskeletal negative musculoskeletal ROS (+)   Abdominal (+) + obese,   Peds negative pediatric ROS (+)  Hematology negative hematology ROS (+) anemia ,   Anesthesia Other Findings   Reproductive/Obstetrics negative OB ROS                             Anesthesia Physical Anesthesia Plan  ASA: IV  Anesthesia Plan: MAC   Post-op Pain Management:    Induction: Intravenous  PONV Risk Score and Plan: 1 and Ondansetron and Treatment may vary due to age or medical condition  Airway Management Planned: Simple Face Mask  Additional Equipment:   Intra-op Plan:   Post-operative Plan:   Informed Consent: I have reviewed the patients History and Physical, chart, labs and discussed the procedure including the risks, benefits and alternatives for the proposed anesthesia with the patient or authorized representative who has indicated his/her understanding and acceptance.     Dental advisory given  Plan Discussed with: CRNA  Anesthesia Plan Comments:         Anesthesia Quick Evaluation

## 2019-12-11 NOTE — Transfer of Care (Signed)
Immediate Anesthesia Transfer of Care Note  Patient: Michael [name]  Procedure(s) Performed: 1ST STAGE BASILIC TRANSPOSITION OF LEFT ARM (Left Arm Upper)  Patient Location: PACU  Anesthesia Type:MAC  Level of Consciousness: awake and alert   Airway & Oxygen Therapy: Patient Spontanous Breathing and Patient connected to face mask oxygen  Post-op Assessment: Report given to RN and Post -op Vital signs reviewed and stable  Post vital signs: Reviewed and stable  Last Vitals:  Vitals Value Taken Time  BP 146/67 12/11/19 0956  Temp    Pulse 78 12/11/19 0958  Resp 19 12/11/19 0958  SpO2 97 % 12/11/19 0958  Vitals shown include unvalidated device data.  Last Pain:  Vitals:   12/11/19 0956  TempSrc:   PainSc: (P) Asleep      Patients Stated Pain Goal: 4 (25/91/02 8902)  Complications: No complications documented.

## 2019-12-11 NOTE — Progress Notes (Signed)
Renal Navigator still awaiting information from financial counselor regarding Medicaid application status. Navigator requested update from [employer] Admissions regarding status of referral. Patient is not cleared for discharge from OP HD standpoint as he is noted as uninsured and therefore, OP HD seat is not yet confirmed.   Alphonzo Cruise, [place] [facility]al Navigator [phone]

## 2019-12-11 NOTE — Progress Notes (Signed)
[facility] Progress Note   Dialysis Orders: new start first HD 9/23   Assessment/Plan: 1. ESRD -new ESRD 2/2 PCKD s/p placement of 1st stage left BVT 9/24 - using TDC - for 2nd HD today - HD tomorrow - plan to hold heparin tonight due to AVF swelling - acute tenderness/echymosis. Dr. [name] has seen Asked dialysis RN to get an ice pack for AVF 2. Anemia - hgb 9> 10 tsat 9% ferritin 41 9/9 -given 355 ferrlicit 9/9 - continue full course of IV Fe before starting ESA 3. Secondary hyperparathyroidism - calcitriol 0.25 iPTH 101 9/9 P 5.4 4. HTN/volume - serially lowering of volume will help BP net UF 1 L Thursday, additional UF today and tomorrow - still making urine 5 .Nutrition - alb 3.6  6. Disp insurance pending  7. OSA/obesity/CPAP  Myriam Jacobson, PA-C [facility] Beeper [phone] 12/11/2019,4:40 PM  LOS: 4 days   Subjective:   C/o pain in right groin - incision - from heart cath (normal coronaries) , left AVF most of all and TDC Percocet helps  Objective Vitals:   12/11/19 1011 12/11/19 1026 12/11/19 1041 12/11/19 1111  BP: (!) 145/78 (!) 141/81 (!) 164/72 123/86  Pulse: 75 73 72 86  Resp: 15 (!) 23 16 17   Temp:    (!) 97.5 F (36.4 C)  TempSrc:    Oral  SpO2: 97% 98% 98% 98%  Weight:      Height:       Physical Exam General: NAD Heart: RRR Lungs: rales  Abdomen: soft obese distended NT right groin dressing intact Extremities: no LE edema  Dialysis Access: 1st stage left BVT moderate swelling/echymosis/very tender + bruit and right [facility]   Additional Objective Labs: Basic Metabolic Panel: Recent Labs  Lab 12/08/19 0454 12/09/19 0503 12/10/19 0823  NA 142 139 139  K 4.5 4.8 5.3*  CL 104 106 107  CO2 20* 20* 22  GLUCOSE 88 95 94  BUN 94* 89* 84*  CREATININE 8.27* 8.15* 7.93*  CALCIUM 8.2* 8.0* 8.1*  PHOS  --   --  5.4*   Liver Function Tests: Recent Labs  Lab 12/07/19 0640 12/08/19 0454 12/10/19 0823  AST 16 13*  --    ALT 15 12  --   ALKPHOS 41 36*  --   BILITOT 0.7 0.4  --   PROT 8.1 7.4  --   ALBUMIN 4.2 3.7 3.6   Recent Labs  Lab 12/07/19 0640  LIPASE 48   CBC: Recent Labs  Lab 12/07/19 0640 12/08/19 0454 12/10/19 0822  WBC 9.4 8.2 10.0  HGB 10.2* 9.0* 9.0*  HCT 32.9* 29.1* 30.0*  MCV 88.0 88.2 87.5  PLT 282 267 265   Blood Culture    Component Value Date/Time   SDES URINE, CLEAN CATCH 05/22/2016 2028   SPECREQUEST NONE 05/22/2016 2028   CULT NO GROWTH 05/22/2016 2028   REPTSTATUS 05/24/2016 FINAL 05/22/2016 2028    Cardiac Enzymes: No results for input(s): CKTOTAL, CKMB, CKMBINDEX, TROPONINI in the last 168 hours. CBG: No results for input(s): GLUCAP in the last 168 hours. Iron Studies: No results for input(s): IRON, TIBC, TRANSFERRIN, FERRITIN in the last 72 hours. Lab Results  Component Value Date   INR 1.3 (H) 12/08/2019   INR 1.2 05/16/2008   Studies/Results: No results found. Medications: . sodium chloride    .  ceFAZolin (ANCEF) IV     . acetaminophen  650 mg Oral Daily  . amLODipine  10 mg  Oral Daily  . aspirin EC  81 mg Oral Daily  . calcitRIOL  0.25 mcg Oral Daily  . calcium carbonate  1 tablet Oral TID  . carvedilol  25 mg Oral BID WC  . Chlorhexidine Gluconate Cloth  6 each Topical Q0600  . docusate sodium  100 mg Oral BID  . heparin  5,000 Units Subcutaneous Q8H  . heparin sodium (porcine)      . heparin sodium (porcine)  1,500 Units Intravenous Once  . hydrALAZINE  100 mg Oral Q8H  . isosorbide dinitrate  20 mg Oral TID  . multivitamin with minerals  1 tablet Oral Daily  . rosuvastatin  10 mg Oral Daily  . sodium chloride flush  3 mL Intravenous Q12H

## 2019-12-11 NOTE — Progress Notes (Signed)
   12/11/19 2018  Hand-Off documentation  Handoff Given Given to shift RN/LPN  Report given to (Full Name) Kathyrn Drown, RN  Handoff Received Received from shift RN/LPN  Report received from (Full Name) Nicoya Friel, RN  Vital Signs  Temp 98.7 F (37.1 C)  Pulse Rate 88  Pulse Rate Source Monitor  Resp 19  BP (!) 146/97  BP Location Right Arm  BP Method Automatic  Patient Position (if appropriate) Lying  Oxygen Therapy  SpO2 98 %  O2 Device Room Air  Pain Assessment  Pain Scale 0-10  Pain Score 4  Faces Pain Scale 4  Pain Type Acute pain  Pain Location Axilla  Pain Orientation Anterior  Pain Descriptors / Indicators Aching  Pain Frequency Constant  Pain Onset On-going  Patients Stated Pain Goal 0  Post-Hemodialysis Assessment  Rinseback Volume (mL) 250 mL  KECN 212 V  Dialyzer Clearance Lightly streaked  Duration of HD Treatment -hour(s) 3 hour(s)  Hemodialysis Intake (mL) 700 mL  UF Total -Machine (mL) 2700 mL  Net UF (mL) 2000 mL  Tolerated HD Treatment Yes  Post-Hemodialysis Comments tx achieved as expected, tolerated well, a/ox4, verbally responsivv.  AVG/AVF Arterial Site Held (minutes) 0 minutes  AVG/AVF Venous Site Held (minutes) 0 minutes  Education / Care Plan  Dialysis Education Provided Yes  Documented Education in Care Plan Yes  Outpatient Plan of Care Reviewed and on Chart Yes  Fistula / Graft Left Upper arm Arteriovenous fistula  Placement Date/Time: 12/11/19 0915   Placed prior to admission: No  Orientation: Left  Access Location: Upper arm  Access Type: Arteriovenous fistula  Site Condition No complications  Hemodialysis Catheter Right Internal jugular Double lumen Temporary (Non-Tunneled)  Placement Date/Time: 12/09/19 1558   Placed prior to admission: No  Time Out: Correct patient;Correct site;Correct procedure  Maximum sterile barrier precautions: Hand hygiene;Cap;Mask;Sterile gown;Sterile gloves;Large sterile sheet  Site Prep: Chlorh...  Site  Condition No complications  Blue Lumen Status Heparin locked;Capped (Central line)  Red Lumen Status Heparin locked;Capped (Central line)  Catheter fill solution Heparin 1000 units/ml  Catheter fill volume (Arterial) 1.9 cc  Catheter fill volume (Venous) 1.9  Dressing Type Occlusive  Dressing Status Clean;Dry;Intact  Drainage Description None  Post treatment catheter status Capped and Clamped

## 2019-12-12 ENCOUNTER — Encounter (HOSPITAL_COMMUNITY): Payer: Self-pay | Admitting: Vascular Surgery

## 2019-12-12 LAB — BASIC METABOLIC PANEL
Anion gap: 15 (ref 5–15)
BUN: 51 mg/dL — ABNORMAL HIGH (ref 6–20)
CO2: 24 mmol/L (ref 22–32)
Calcium: 8.6 mg/dL — ABNORMAL LOW (ref 8.9–10.3)
Chloride: 97 mmol/L — ABNORMAL LOW (ref 98–111)
Creatinine, Ser: 6.98 mg/dL — ABNORMAL HIGH (ref 0.61–1.24)
GFR calc Af Amer: 10 mL/min — ABNORMAL LOW (ref >60)
GFR calc non Af Amer: 9 mL/min — ABNORMAL LOW (ref >60)
Glucose, Bld: 97 mg/dL (ref 70–99)
Potassium: 4.4 mmol/L (ref 3.5–5.1)
Sodium: 136 mmol/L (ref 135–145)

## 2019-12-12 LAB — CBC
HCT: 32.1 % — ABNORMAL LOW (ref 39.0–52.0)
Hemoglobin: 9.8 g/dL — ABNORMAL LOW (ref 13.0–17.0)
MCH: 26.3 pg (ref 26.0–34.0)
MCHC: 30.5 g/dL (ref 30.0–36.0)
MCV: 86.3 fL (ref 80.0–100.0)
Platelets: 263 10*3/uL (ref 150–400)
RBC: 3.72 MIL/uL — ABNORMAL LOW (ref 4.22–5.81)
RDW: 14.7 % (ref 11.5–15.5)
WBC: 10.5 10*3/uL (ref 4.0–10.5)
nRBC: 0 % (ref 0.0–0.2)

## 2019-12-12 MED ORDER — SEVELAMER CARBONATE 800 MG PO TABS
1600.0000 mg | ORAL_TABLET | Freq: Three times a day (TID) | ORAL | Status: DC
  Administered 2019-12-12 – 2019-12-13 (×4): 1600 mg via ORAL
  Filled 2019-12-12 (×5): qty 2

## 2019-12-12 MED ORDER — OXYCODONE HCL 5 MG PO TABS: 5.0000 mg | ORAL_TABLET | ORAL | Status: DC | PRN

## 2019-12-12 MED ORDER — OXYCODONE-ACETAMINOPHEN 5-325 MG PO TABS
1.0000 | ORAL_TABLET | ORAL | Status: DC | PRN
Start: 2019-12-12 — End: 2019-12-12

## 2019-12-12 MED ORDER — OXYCODONE-ACETAMINOPHEN 5-325 MG PO TABS
1.0000 | ORAL_TABLET | ORAL | Status: DC | PRN
  Administered 2019-12-12 – 2019-12-14 (×10): 1 via ORAL
  Filled 2019-12-12 (×10): qty 1

## 2019-12-12 MED ORDER — OXYCODONE-ACETAMINOPHEN 5-325 MG PO TABS
1.0000 | ORAL_TABLET | Freq: Four times a day (QID) | ORAL | Status: DC | PRN
  Administered 2019-12-12: 1 via ORAL
  Filled 2019-12-12: qty 1

## 2019-12-12 MED ORDER — OXYCODONE HCL 5 MG PO TABS
10.0000 mg | ORAL_TABLET | Freq: Four times a day (QID) | ORAL | Status: DC | PRN
  Administered 2019-12-12: 10 mg via ORAL
  Filled 2019-12-12: qty 2

## 2019-12-12 MED ORDER — BISACODYL 10 MG RE SUPP
10.0000 mg | Freq: Once | RECTAL | Status: AC | PRN
  Administered 2019-12-12: 10 mg via RECTAL
  Filled 2019-12-12: qty 1

## 2019-12-12 MED ORDER — ACETAMINOPHEN 500 MG PO TABS: 1000.0000 mg | ORAL_TABLET | Freq: Four times a day (QID) | ORAL | Status: DC

## 2019-12-12 MED ORDER — OXYCODONE HCL 5 MG PO TABS: 5.0000 mg | ORAL_TABLET | Freq: Four times a day (QID) | ORAL | Status: DC | PRN

## 2019-12-12 NOTE — Progress Notes (Signed)
PROGRESS NOTE    Michael [name]  DJT:[id number] DOB: Aug 06, 1975 DOA: 12/07/2019 PCP: [name], FNP   Brief Narrative:  44 year old male with history of CKD-5 secondary to ADPKD followed by Dr. Hollie Salk, OSA on CPAP, HTN, recent hospitalization from 9/8-9/15 for hypertensive emergency and AKI on CKD-5 presenting with multiple complaints including chest pain, shortness of breath, diffuse abdominal pain, nausea, vomiting, constipation, paresthesia in hands and feet. The plan was to follow-up with nephrology and start HD in about 2 months but he never followed up. Echo at that time with EF of 60 to 65% and concern for infiltrative process. Cardiology consulted. He had a nuclear stress test that showed unchanged risk, and cardiology recommended medical management. In ED, afebrile. BP 174/106. On room air. Na 134. CO2 17. AG 20. Cr 8.15 (7.98 on 09/15). BUN 94. High-sensitivity troponin 40> 34. Hgb 10.2. Two view CXR and CTH without acute finding. Nephrology consulted. Plan is to start HD after HD cath placement.  Assessment & Plan:   Principal Problem:   AKI (acute kidney injury) ([place]) Active Problems:   Adult polycystic kidney disease   Essential hypertension, benign   Metabolic acidosis, increased anion gap   CKD (chronic kidney disease), stage V (HCC)   Uremia   Chest pain   AKI on CKD5 with azotemia and patient with history of ADPKD-likely due to uncontrolled hypertension progressing to ESRD, POA Concurrent anion gap acidosis due to above:  - Anion gap metabolic acidosis-likely due to renal failure/uremia, resolving - Nephrology following - defer for HD scheduling - dialysis cath placed 9/22 - First stage AV basilic transposition graft in the left upper extremity on 9/24 with Dr. [name].  - Vein mapping pending -Patient continues to have poor pain control from previous fistula placement and recent line placement as well as cardiac catheterization site.  Stop OxyContin and scheduled  Tylenol, resume Percocet 5 mg every 4 hours as needed -do not escalate narcotics despite frequent requests for IV narcotics specifically: high potential for abuse/polypharmacy - pain seems very out of proportion to procedure and clinical exam  Possible infiltrative cardiomyopathy/possible secondary to PHTN:  - Echo in 9/13 with EF of 60 to 65%,  2.83mm LV wall thickness concerning for infiltrative process and RVSP to 34. Does not appear fluid overloaded. No cardiopulmonary symptoms this morning. - HD for fluid management - Outpatient follow-up with cardiology, Dr. [name]  Uncontrolled hypertension/hypertensive emergency, resolving:  - Multifactorial including noncompliance, underlying sleep apnea and renal failure. BP improved. - Continue current regimen: Amlodipine, carvedilol, hydralazine, isosorbide - Defer further adjustment to cardiology and nephrology  Atypical chest pain/elevated troponin: Likely type 2 demand ischemia from uncontrolled hypertension, transient, resolved  - EKG without acute ischemic finding.  - Patient previously had outpatient stress test recently that was intermediate risk. - Cardiology following: Reports essentially normal coronary vessels on cardiac cath  Chronic anemia of iron deficiency and likely chronic disease:  - Slight drop in hemoglobin but about baseline. Iron sat 9% on recent iron panel on 9/9. - Monitor H&H - IV iron and Aranesp per nephrology  Hyperlipidemia -Continue statin  OSA on CPAP -Nightly CPAP  Obesity: BMI 34.73 -Encourage lifestyle change to lose weight Estimated body mass index is 35.28 kg/m as calculated from the following:   Height as of this encounter: 6' (1.829 m).   Weight as of this encounter: 118 kg.   DVT prophylaxis: Heparin injection 5,000 Units Code Status: Full code Family Communication:  None present, patient to update  family.  Status is: Inpatient  Dispo: The patient is from: Home              Anticipated  d/c is to: Home              Anticipated d/c date is: To be determined pending clinical course and need for dialysis bed clipping -likely additional 48 to 72 hours              Patient currently not medically stable for discharge  Consultants:   Cardiology, nephrology  Procedures:   Cardiac catheterization 12/09/2019  Dialysis catheter placement 12/09/2019  Hemodialysis initiated 06/18/270  Stage I basilic vein transposition for AV graft 12/11/2019  Antimicrobials:  Not currently indicated  Subjective: No acute issues or events overnight, patient continues to have uncontrolled pain despite appearing well at bedside, he reports scheduled Tylenol and as needed OxyContin overnight were "of no help" but indicates previous dosage of Percocet had improved his pain as well as previous IV narcotics which we discussed was not appropriate at this time.  Otherwise denies headaches, fevers, chills, nausea, vomiting, diarrhea.  Objective: Vitals:   12/11/19 2000 12/11/19 2018 12/11/19 2053 12/12/19 0551  BP:  (!) 146/97 (!) 156/98 (!) 136/95  Pulse: 75 88 80 87  Resp: 17 19 18 18   Temp:  98.7 F (37.1 C) 98.4 F (36.9 C) 98.8 F (37.1 C)  TempSrc:   Oral Oral  SpO2: 98% 98% 97% 93%  Weight:      Height:        Intake/Output Summary (Last 24 hours) at 12/12/2019 0757 Last data filed at 12/12/2019 0600 Gross per 24 hour  Intake 1460 ml  Output 2000 ml  Net -540 ml   Filed Weights   12/10/19 2053 12/11/19 0809 12/11/19 1655  Weight: 117.3 kg 117.3 kg 118 kg    Examination:  General:  Pleasantly resting in bed receiving dialysis, No acute distress. HEENT:  Normocephalic atraumatic.  Sclerae nonicteric, noninjected.  Extraocular movements intact bilaterally. Neck:  Without mass or deformity.  Trachea is midline.  Right IJ dialysis catheter bandage and site clean dry intact. Lungs:  Clear to auscultate bilaterally without rhonchi, wheeze, or rales. Heart:  Regular rate and rhythm.   Without murmurs, rubs, or gallops. Abdomen:  Soft, nontender, nondistended.  Without guarding or rebound. Extremities: Without cyanosis, clubbing, edema, or obvious deformity.  Left upper extremity medial aspect 2 small 4 cm incisions clean dry without erythema or bleeding. Vascular:  Dorsalis pedis and posterior tibial pulses palpable bilaterally. Skin:  Warm and dry, no erythema, no ulcerations.   Data Reviewed: I have personally reviewed following labs and imaging studies  CBC: Recent Labs  Lab 12/07/19 0640 12/08/19 0454 12/10/19 0822 12/11/19 1627  WBC 9.4 8.2 10.0 10.0  HGB 10.2* 9.0* 9.0* 9.1*  HCT 32.9* 29.1* 30.0* 29.7*  MCV 88.0 88.2 87.5 87.6  PLT 282 267 265 536   Basic Metabolic Panel: Recent Labs  Lab 12/07/19 0640 12/08/19 0454 12/09/19 0503 12/10/19 0823 12/11/19 1627  NA 134* 142 139 139 137  K 4.2 4.5 4.8 5.3* 4.6  CL 97* 104 106 107 101  CO2 17* 20* 20* 22 23  GLUCOSE 99 88 95 94 105*  BUN 94* 94* 89* 84* 63*  CREATININE 8.15* 8.27* 8.15* 7.93* 7.23*  CALCIUM 8.4* 8.2* 8.0* 8.1* 8.2*  PHOS  --   --   --  5.4* 6.2*   GFR: Estimated Creatinine Clearance: 17.3 mL/min (A) (by C-G  formula based on SCr of 7.23 mg/dL (H)). Liver Function Tests: Recent Labs  Lab 12/07/19 0640 12/08/19 0454 12/10/19 0823 12/11/19 1627  AST 16 13*  --   --   ALT 15 12  --   --   ALKPHOS 41 36*  --   --   BILITOT 0.7 0.4  --   --   PROT 8.1 7.4  --   --   ALBUMIN 4.2 3.7 3.6 3.6   Recent Labs  Lab 12/07/19 0640  LIPASE 48   No results for input(s): AMMONIA in the last 168 hours. Coagulation Profile: Recent Labs  Lab 12/08/19 0454  INR 1.3*   Cardiac Enzymes: No results for input(s): CKTOTAL, CKMB, CKMBINDEX, TROPONINI in the last 168 hours. BNP (last 3 results) No results for input(s): PROBNP in the last 8760 hours. HbA1C: No results for input(s): HGBA1C in the last 72 hours. CBG: No results for input(s): GLUCAP in the last 168 hours. Lipid  Profile: No results for input(s): CHOL, HDL, LDLCALC, TRIG, CHOLHDL, LDLDIRECT in the last 72 hours. Thyroid Function Tests: No results for input(s): TSH, T4TOTAL, FREET4, T3FREE, THYROIDAB in the last 72 hours. Anemia Panel: No results for input(s): VITAMINB12, FOLATE, FERRITIN, TIBC, IRON, RETICCTPCT in the last 72 hours. Sepsis Labs: No results for input(s): PROCALCITON, LATICACIDVEN in the last 168 hours.  Recent Results (from the past 240 hour(s))  SARS Coronavirus 2 by RT PCR (hospital order, performed in [facility] hospital lab) Nasopharyngeal Nasopharyngeal Swab     Status: None   Collection Time: 12/07/19  9:47 AM   Specimen: Nasopharyngeal Swab  Result Value Ref Range Status   SARS Coronavirus 2 NEGATIVE NEGATIVE Final    Comment: (NOTE) SARS-CoV-2 target nucleic acids are NOT DETECTED.  The SARS-CoV-2 RNA is generally detectable in upper and lower respiratory specimens during the acute phase of infection. The lowest concentration of SARS-CoV-2 viral copies this assay can detect is 250 copies / mL. A negative result does not preclude SARS-CoV-2 infection and should not be used as the sole basis for treatment or other patient management decisions.  A negative result may occur with improper specimen collection / handling, submission of specimen other than nasopharyngeal swab, presence of viral mutation(s) within the areas targeted by this assay, and inadequate number of viral copies (<250 copies / mL). A negative result must be combined with clinical observations, patient history, and epidemiological information.  Fact Sheet for Patients:   StrictlyIdeas.no  Fact Sheet for Healthcare Providers: BankingDealers.co.za  This test is not yet approved or  cleared by the [place] FDA and has been authorized for detection and/or diagnosis of SARS-CoV-2 by FDA under an Emergency Use Authorization (EUA).  This EUA will remain in  effect (meaning this test can be used) for the duration of the COVID-19 declaration under Section 564(b)(1) of the Act, 21 U.S.C. section 360bbb-3(b)(1), unless the authorization is terminated or revoked sooner.  Performed at [facility], [place] [address], [place], [place] [address]   Surgical pcr screen     Status: None   Collection Time: 12/11/19  6:25 AM   Specimen: Nasal Mucosa; Nasal Swab  Result Value Ref Range Status   MRSA, PCR NEGATIVE NEGATIVE Final   Staphylococcus aureus NEGATIVE NEGATIVE Final    Comment: (NOTE) The Xpert SA Assay (FDA approved for NASAL specimens in patients 76 years of age and older), is one component of a comprehensive surveillance program. It is not intended to diagnose infection nor  to guide or monitor treatment. Performed at [facility], [place] [address], [place], [place] [address]      Radiology Studies: No results found.  Scheduled Meds: . acetaminophen  1,000 mg Oral Q6H  . amLODipine  10 mg Oral Daily  . aspirin EC  81 mg Oral Daily  . calcitRIOL  0.25 mcg Oral Daily  . calcium carbonate  1 tablet Oral TID  . carvedilol  25 mg Oral BID WC  . Chlorhexidine Gluconate Cloth  6 each Topical Q0600  . docusate sodium  100 mg Oral BID  . heparin  5,000 Units Subcutaneous Q8H  . hydrALAZINE  100 mg Oral Q8H  . isosorbide dinitrate  20 mg Oral TID  . multivitamin with minerals  1 tablet Oral Daily  . rosuvastatin  10 mg Oral Daily  . sodium chloride flush  10-40 mL Intracatheter Q12H  . sodium chloride flush  3 mL Intravenous Q12H   Continuous Infusions: . sodium chloride    . ferric gluconate (FERRLECIT/NULECIT) IV      LOS: 5 days   Time spent: 39min  Josuel Koeppen C Corneilus Heggie, DO [facility]  If 7PM-7AM, please contact night-coverage [email or web address]  12/12/2019, 7:57 AM

## 2019-12-12 NOTE — Progress Notes (Signed)
HD treatment order modified for 12/13/2019 per Dr Jonnie Finner, HD orders modified. Primary RN Anisha  notified at [phone]

## 2019-12-12 NOTE — Progress Notes (Signed)
[facility] Progress Note   Dialysis Orders: new start first HD 9/23   Assessment/Plan: 1. ESRD -new ESRD 2/2 PCKD s/p placement of 1st stage left BVT 9/24 - using TDC - for 2nd HD today - HD today unless bumped until Sunday due to staffing issues 2. Anemia - hgb 9> 10 > 9.1  tsat 9% ferritin 41 9/9 -given 397 ferrlicit 9/9 - continue full course of IV Fe before starting ESA 3. Secondary hyperparathyroidism - calcitriol 0.25 iPTH 101 9/9 P 5.4 > 6.2 - start binders renvela 2 ac 4. HTN/volume - serially lowering of volume will help BP net UF 1 L Thursday, 2 L Friday and again today- still making urine.  However due to staffing limitations, HD may be bumped today until tomorrow. 5 .Nutrition - alb 3.6  6. Disp insurance pending  7. OSA/obesity/CPAP 8. Pain control - per primary - seems disproportionate especially at multiple sites  Myriam Jacobson, PA-C [place] [phone] 12/12/2019,8:16 AM  LOS: 5 days   Subjective:   No BM for several days. Had laxative this am. Continues to c/o of pain att groin, TDC tunnel and also AVF . Discussed how pain meds can contribute to constipation. Encouraged to get up and walk around. Objective Vitals:   12/11/19 2000 12/11/19 2018 12/11/19 2053 12/12/19 0551  BP:  (!) 146/97 (!) 156/98 (!) 136/95  Pulse: 75 88 80 87  Resp: 17 19 18 18   Temp:  98.7 F (37.1 C) 98.4 F (36.9 C) 98.8 F (37.1 C)  TempSrc:   Oral Oral  SpO2: 98% 98% 97% 93%  Weight:      Height:       Physical Exam General: NAD Heart: RRR Lungs: rales  Abdomen: soft obese distended NT right groin dressing intact Extremities: no LE edema  Dialysis Access: 1st stage left BVT swelling better dim thrill and right [facility]   Additional Objective Labs: Basic Metabolic Panel: Recent Labs  Lab 12/09/19 0503 12/10/19 0823 12/11/19 1627  NA 139 139 137  K 4.8 5.3* 4.6  CL 106 107 101  CO2 20* 22 23  GLUCOSE 95 94 105*  BUN 89* 84* 63*   CREATININE 8.15* 7.93* 7.23*  CALCIUM 8.0* 8.1* 8.2*  PHOS  --  5.4* 6.2*   Liver Function Tests: Recent Labs  Lab 12/07/19 0640 12/07/19 0640 12/08/19 0454 12/10/19 0823 12/11/19 1627  AST 16  --  13*  --   --   ALT 15  --  12  --   --   ALKPHOS 41  --  36*  --   --   BILITOT 0.7  --  0.4  --   --   PROT 8.1  --  7.4  --   --   ALBUMIN 4.2   < > 3.7 3.6 3.6   < > = values in this interval not displayed.   Recent Labs  Lab 12/07/19 0640  LIPASE 48   CBC: Recent Labs  Lab 12/07/19 0640 12/07/19 0640 12/08/19 0454 12/10/19 0822 12/11/19 1627  WBC 9.4   < > 8.2 10.0 10.0  HGB 10.2*   < > 9.0* 9.0* 9.1*  HCT 32.9*   < > 29.1* 30.0* 29.7*  MCV 88.0  --  88.2 87.5 87.6  PLT 282   < > 267 265 254   < > = values in this interval not displayed.   Blood Culture    Component Value Date/Time   SDES  URINE, CLEAN CATCH 05/22/2016 2028   SPECREQUEST NONE 05/22/2016 2028   CULT NO GROWTH 05/22/2016 2028   REPTSTATUS 05/24/2016 FINAL 05/22/2016 2028    Cardiac Enzymes: No results for input(s): CKTOTAL, CKMB, CKMBINDEX, TROPONINI in the last 168 hours. CBG: No results for input(s): GLUCAP in the last 168 hours. Iron Studies: No results for input(s): IRON, TIBC, TRANSFERRIN, FERRITIN in the last 72 hours. Lab Results  Component Value Date   INR 1.3 (H) 12/08/2019   INR 1.2 05/16/2008   Studies/Results: No results found. Medications: . sodium chloride    . ferric gluconate (FERRLECIT/NULECIT) IV     . acetaminophen  1,000 mg Oral Q6H  . amLODipine  10 mg Oral Daily  . aspirin EC  81 mg Oral Daily  . calcitRIOL  0.25 mcg Oral Daily  . calcium carbonate  1 tablet Oral TID  . carvedilol  25 mg Oral BID WC  . Chlorhexidine Gluconate Cloth  6 each Topical Q0600  . docusate sodium  100 mg Oral BID  . heparin  5,000 Units Subcutaneous Q8H  . hydrALAZINE  100 mg Oral Q8H  . isosorbide dinitrate  20 mg Oral TID  . multivitamin with minerals  1 tablet Oral Daily  .  rosuvastatin  10 mg Oral Daily  . sodium chloride flush  10-40 mL Intracatheter Q12H  . sodium chloride flush  3 mL Intravenous Q12H

## 2019-12-12 NOTE — Progress Notes (Signed)
   VASCULAR SURGERY ASSESSMENT & PLAN:   POD 1 -LEFT FIRST STAGE BASILIC VEIN TRANSPOSITION: The fistula is patent.  He has a follow-up visit with [place] in 6 to 8 weeks.  Vascular surgery will be available as needed.  SUBJECTIVE:   His cath site hurts, his left arm hurts, and his tunneled catheter site hurts.  PHYSICAL EXAM:   Vitals:   12/11/19 2000 12/11/19 2018 12/11/19 2053 12/12/19 0551  BP:  (!) 146/97 (!) 156/98 (!) 136/95  Pulse: 75 88 80 87  Resp: 17 19 18 18   Temp:  98.7 F (37.1 C) 98.4 F (36.9 C) 98.8 F (37.1 C)  TempSrc:   Oral Oral  SpO2: 98% 98% 97% 93%  Weight:      Height:       Audible thrill in upper arm fistula. Palpable left radial pulse  LABS:     PROBLEM LIST:    Principal Problem:   AKI (acute kidney injury) ([place]) Active Problems:   Adult polycystic kidney disease   Essential hypertension, benign   Metabolic acidosis, increased anion gap   CKD (chronic kidney disease), stage V (HCC)   Uremia   Chest pain   CURRENT MEDS:   . acetaminophen  1,000 mg Oral Q6H  . amLODipine  10 mg Oral Daily  . aspirin EC  81 mg Oral Daily  . calcitRIOL  0.25 mcg Oral Daily  . calcium carbonate  1 tablet Oral TID  . carvedilol  25 mg Oral BID WC  . Chlorhexidine Gluconate Cloth  6 each Topical Q0600  . docusate sodium  100 mg Oral BID  . heparin  5,000 Units Subcutaneous Q8H  . hydrALAZINE  100 mg Oral Q8H  . isosorbide dinitrate  20 mg Oral TID  . multivitamin with minerals  1 tablet Oral Daily  . rosuvastatin  10 mg Oral Daily  . sodium chloride flush  10-40 mL Intracatheter Q12H  . sodium chloride flush  3 mL Intravenous Q12H    Deitra Mayo Office: [phone] 12/12/2019

## 2019-12-12 NOTE — Progress Notes (Signed)
Pt stated he could put his CPAP on himself.

## 2019-12-12 NOTE — Progress Notes (Signed)
   12/12/19 2051  Provider Notification  Provider Name/Title Dr. Myna Hidalgo  Date Provider Notified 12/12/19  Time Provider Notified 2051  Notification Type Page  Notification Reason Requested by patient/family (c/o constipation - wants suppository/enema)  Response See new orders  Date of Provider Response 12/12/19   Patient complaining of constipation.  Abdomen is distended.  Positive bowel sounds.  He is passing gas.  Last bowel movement is 12/09/2019.  Feels that Miralax and Colace PO is not helping.  Wants a suppository or enema.  Dr. Myna Hidalgo made aware and order received for dulcolax suppository and given to patient.  Will continue to monitor patient for relief of constipation.  Earleen Reaper RN

## 2019-12-12 NOTE — Progress Notes (Signed)
Patient is requesting that his percocet be added back on. Avon Gully, MD ordered Percocet 1 tablet every six hours. Patient states that he feels this will not work for him and is requesting to either have two tablets or increase frequency to every four hours. MD notified of patients request. MD stated to wait and see how the one percocet helps. Patient updated of this information.

## 2019-12-13 MED ORDER — DOCUSATE SODIUM 283 MG RE ENEM
1.0000 | ENEMA | Freq: Once | RECTAL | Status: AC
  Administered 2019-12-13: 283 mg via RECTAL
  Filled 2019-12-13: qty 1

## 2019-12-13 NOTE — Progress Notes (Addendum)
[facility] Progress Note   Dialysis Orders: new start first HD 9/23   Assessment/Plan: 1. ESRD -new ESRD 2/2 PCKD s/p placement of 1st stage left BVT 9/24 - using TDC - f - HD 9/25  unless bumped until Sunday due to staffing issues but may be bumped until Monday K 4.4 9/25 2. Anemia - hgb 9> 10 > 9.1 > 9.8  tsat 9% ferritin 41 9/9 -given 628 ferrlicit 9/9 - continue full course of IV Fe before starting ESA 3. Secondary hyperparathyroidism - calcitriol 0.25 iPTH 101 9/9 P 5.4 > 6.2 - start binders renvela 2 ac 4. HTN/volume - serially lowering of volume will help BP net UF 1 L Thursday, 2 L Friday and again today- still making urine.  However due to staffing limitations, HD bumped as above 5 .Nutrition - alb 3.6  6. Disp insurance pending  7. OSA/obesity/CPAP 8. Pain control - per primary - seems disproportionate especially at multiple sites 9. Constipation - add enema to current regimen 10. Disp - awaiting confirmation for acceptance into outpatient dialysis - hopefully we can confirm this Monday  Myriam Jacobson, PA-C [place] [phone] 12/13/2019,9:10 AM  LOS: 6 days   Subjective:   Still no BM even with suppository yesterday. Requesting enema - will order  Objective Vitals:   12/12/19 1024 12/12/19 1704 12/12/19 2111 12/13/19 0451  BP: 133/80 (!) 143/85 (!) 144/81 130/84  Pulse: 92 91 92 85  Resp: 18 18 17 17   Temp: 99 F (37.2 C) 98 F (36.7 C) 98.8 F (37.1 C) 98 F (36.7 C)  TempSrc: Oral Oral Oral Oral  SpO2: 95% 97% 96% 96%  Weight:   118 kg   Height:       Physical Exam General: obese male breathing easily Heart: RRR Lungs: grossly clear Abdomen: soft obese distended NT right groin dressing intact Extremities: no LE edema  Dialysis Access: 1st stage left BVT with bruising, swelling better dim thrill + bruit and right [facility]   Additional Objective Labs: Basic Metabolic Panel: Recent Labs  Lab 12/10/19 0823  12/11/19 1627 12/12/19 2039  NA 139 137 136  K 5.3* 4.6 4.4  CL 107 101 97*  CO2 22 23 24   GLUCOSE 94 105* 97  BUN 84* 63* 51*  CREATININE 7.93* 7.23* 6.98*  CALCIUM 8.1* 8.2* 8.6*  PHOS 5.4* 6.2*  --    Liver Function Tests: Recent Labs  Lab 12/07/19 0640 12/07/19 0640 12/08/19 0454 12/10/19 0823 12/11/19 1627  AST 16  --  13*  --   --   ALT 15  --  12  --   --   ALKPHOS 41  --  36*  --   --   BILITOT 0.7  --  0.4  --   --   PROT 8.1  --  7.4  --   --   ALBUMIN 4.2   < > 3.7 3.6 3.6   < > = values in this interval not displayed.   Recent Labs  Lab 12/07/19 0640  LIPASE 48   CBC: Recent Labs  Lab 12/07/19 0640 12/07/19 0640 12/08/19 0454 12/08/19 0454 12/10/19 0822 12/11/19 1627 12/12/19 2039  WBC 9.4   < > 8.2   < > 10.0 10.0 10.5  HGB 10.2*   < > 9.0*   < > 9.0* 9.1* 9.8*  HCT 32.9*   < > 29.1*   < > 30.0* 29.7* 32.1*  MCV 88.0  --  88.2  --  87.5 87.6 86.3  PLT 282   < > 267   < > 265 254 263   < > = values in this interval not displayed.   Blood Culture    Component Value Date/Time   SDES URINE, CLEAN CATCH 05/22/2016 2028   SPECREQUEST NONE 05/22/2016 2028   CULT NO GROWTH 05/22/2016 2028   REPTSTATUS 05/24/2016 FINAL 05/22/2016 2028    Cardiac Enzymes: No results for input(s): CKTOTAL, CKMB, CKMBINDEX, TROPONINI in the last 168 hours. CBG: No results for input(s): GLUCAP in the last 168 hours. Iron Studies: No results for input(s): IRON, TIBC, TRANSFERRIN, FERRITIN in the last 72 hours. Lab Results  Component Value Date   INR 1.3 (H) 12/08/2019   INR 1.2 05/16/2008   Studies/Results: No results found. Medications: . sodium chloride    . ferric gluconate (FERRLECIT/NULECIT) IV     . amLODipine  10 mg Oral Daily  . aspirin EC  81 mg Oral Daily  . calcitRIOL  0.25 mcg Oral Daily  . calcium carbonate  1 tablet Oral TID  . carvedilol  25 mg Oral BID WC  . Chlorhexidine Gluconate Cloth  6 each Topical Q0600  . docusate sodium  100 mg  Oral BID  . heparin  5,000 Units Subcutaneous Q8H  . hydrALAZINE  100 mg Oral Q8H  . isosorbide dinitrate  20 mg Oral TID  . multivitamin with minerals  1 tablet Oral Daily  . rosuvastatin  10 mg Oral Daily  . sevelamer carbonate  1,600 mg Oral TID WC  . sodium chloride flush  10-40 mL Intracatheter Q12H  . sodium chloride flush  3 mL Intravenous Q12H

## 2019-12-13 NOTE — Progress Notes (Signed)
Patient stated he would place himself on CPAP when he is ready. RT will monitor as needed.

## 2019-12-13 NOTE — Progress Notes (Signed)
PROGRESS NOTE    Michael [name]  TGG:[id number] DOB: Mar 12, 1976 DOA: 12/07/2019 PCP: [name], FNP   Brief Narrative:  44 year old male with history of CKD-5 secondary to ADPKD followed by Dr. Hollie Salk, OSA on CPAP, HTN, recent hospitalization from 9/8-9/15 for hypertensive emergency and AKI on CKD-5 presenting with multiple complaints including chest pain, shortness of breath, diffuse abdominal pain, nausea, vomiting, constipation, paresthesia in hands and feet. The plan was to follow-up with nephrology and start HD in about 2 months but he never followed up. Echo at that time with EF of 60 to 65% and concern for infiltrative process. Cardiology consulted. He had a nuclear stress test that showed unchanged risk, and cardiology recommended medical management. In ED, afebrile. BP 174/106. On room air. Na 134. CO2 17. AG 20. Cr 8.15 (7.98 on 09/15). BUN 94. High-sensitivity troponin 40> 34. Hgb 10.2. Two view CXR and CTH without acute finding. Nephrology consulted. Plan is to start HD after HD cath placement.  Assessment & Plan:   Principal Problem:   AKI (acute kidney injury) ([place]) Active Problems:   Adult polycystic kidney disease   Essential hypertension, benign   Metabolic acidosis, increased anion gap   CKD (chronic kidney disease), stage V (HCC)   Uremia   Chest pain   AKI on CKD5 with azotemia and patient with history of ADPKD-likely due to uncontrolled hypertension progressing to ESRD, POA Concurrent anion gap acidosis due to above:  - Anion gap metabolic acidosis-likely due to renal failure/uremia, resolving - Nephrology following - defer for HD scheduling - dialysis cath placed 9/22 - First stage AV basilic transposition graft in the left upper extremity on 9/24 with Dr. [name].  -Patient continues to complain of pain but moderately well controlled compared to previous, continue Percocet 5 mg every 4 hours as needed, will likely need to wean down or off prior to  discharge.  Possible infiltrative cardiomyopathy/possible secondary to PHTN:  - Echo in 9/13 with EF of 60 to 65%,  2.98mm LV wall thickness concerning for infiltrative process and RVSP to 34. Does not appear fluid overloaded. No cardiopulmonary symptoms this morning. - HD for fluid management - Outpatient follow-up with cardiology, Dr. [name]  Uncontrolled hypertension/hypertensive emergency, resolving:  - Multifactorial including noncompliance, underlying sleep apnea and renal failure. BP improved. - Continue current regimen: Amlodipine, carvedilol, hydralazine, isosorbide - Defer further adjustment to cardiology and nephrology  Atypical chest pain/elevated troponin: Likely type 2 demand ischemia from uncontrolled hypertension, transient, resolved  - EKG reassuring without acute ischemic finding.  - Patient previously had outpatient stress test recently that was intermediate risk. - Cardiology following: Reports essentially normal coronary vessels on cardiac cath  Chronic anemia of iron deficiency and likely chronic disease:  - Slight drop in hemoglobin but about baseline. Iron sat 9% on recent iron panel on 9/9. - Monitor H&H - IV iron and Aranesp per nephrology  Hyperlipidemia -Continue statin  OSA on CPAP -Nightly CPAP  Obesity: BMI 34.73 -Encourage lifestyle change to lose weight Estimated body mass index is 35.28 kg/m as calculated from the following:   Height as of this encounter: 6' (1.829 m).   Weight as of this encounter: 118 kg.   DVT prophylaxis: Heparin injection 5,000 Units Code Status: Full code Family Communication:  None present, patient to update family.  Status is: Inpatient  Dispo: The patient is from: Home              Anticipated d/c is to: Home  Anticipated d/c date is: To be determined pending clinical course and need for dialysis bed clipping -likely additional 48 to 72 hours              Patient currently not medically stable for  discharge  Consultants:   Cardiology, nephrology  Procedures:   Cardiac catheterization 12/09/2019  Dialysis catheter placement 12/09/2019  Hemodialysis initiated 0/15/6153  Stage I basilic vein transposition for AV graft 12/11/2019  Antimicrobials:  Not currently indicated  Subjective: No acute issues or events overnight, pain currently well controlled on current regimen denies nausea, vomiting, diarrhea, constipation, headache, fevers, chills.  Objective: Vitals:   12/12/19 1024 12/12/19 1704 12/12/19 2111 12/13/19 0451  BP: 133/80 (!) 143/85 (!) 144/81 130/84  Pulse: 92 91 92 85  Resp: 18 18 17 17   Temp: 99 F (37.2 C) 98 F (36.7 C) 98.8 F (37.1 C) 98 F (36.7 C)  TempSrc: Oral Oral Oral Oral  SpO2: 95% 97% 96% 96%  Weight:   118 kg   Height:        Intake/Output Summary (Last 24 hours) at 12/13/2019 0755 Last data filed at 12/13/2019 0600 Gross per 24 hour  Intake 1560 ml  Output 0 ml  Net 1560 ml   Filed Weights   12/11/19 0809 12/11/19 1655 12/12/19 2111  Weight: 117.3 kg 118 kg 118 kg    Examination:  General:  Pleasantly resting in bed receiving dialysis, No acute distress. HEENT:  Normocephalic atraumatic.  Sclerae nonicteric, noninjected.  Extraocular movements intact bilaterally. Neck:  Without mass or deformity.  Trachea is midline.  Right IJ dialysis catheter bandage and site clean dry intact. Lungs:  Clear to auscultate bilaterally without rhonchi, wheeze, or rales. Heart:  Regular rate and rhythm.  Without murmurs, rubs, or gallops. Abdomen:  Soft, nontender, nondistended.  Without guarding or rebound. Extremities: Without cyanosis, clubbing, edema, or obvious deformity.  Left upper extremity medial aspect 2 small 4 cm incisions clean dry without erythema or bleeding. Vascular:  Dorsalis pedis and posterior tibial pulses palpable bilaterally. Skin:  Warm and dry, no erythema, no ulcerations.   Data Reviewed: I have personally reviewed  following labs and imaging studies  CBC: Recent Labs  Lab 12/07/19 0640 12/08/19 0454 12/10/19 0822 12/11/19 1627 12/12/19 2039  WBC 9.4 8.2 10.0 10.0 10.5  HGB 10.2* 9.0* 9.0* 9.1* 9.8*  HCT 32.9* 29.1* 30.0* 29.7* 32.1*  MCV 88.0 88.2 87.5 87.6 86.3  PLT 282 267 265 254 794   Basic Metabolic Panel: Recent Labs  Lab 12/08/19 0454 12/09/19 0503 12/10/19 0823 12/11/19 1627 12/12/19 2039  NA 142 139 139 137 136  K 4.5 4.8 5.3* 4.6 4.4  CL 104 106 107 101 97*  CO2 20* 20* 22 23 24   GLUCOSE 88 95 94 105* 97  BUN 94* 89* 84* 63* 51*  CREATININE 8.27* 8.15* 7.93* 7.23* 6.98*  CALCIUM 8.2* 8.0* 8.1* 8.2* 8.6*  PHOS  --   --  5.4* 6.2*  --    GFR: Estimated Creatinine Clearance: 17.9 mL/min (A) (by C-G formula based on SCr of 6.98 mg/dL (H)). Liver Function Tests: Recent Labs  Lab 12/07/19 0640 12/08/19 0454 12/10/19 0823 12/11/19 1627  AST 16 13*  --   --   ALT 15 12  --   --   ALKPHOS 41 36*  --   --   BILITOT 0.7 0.4  --   --   PROT 8.1 7.4  --   --   ALBUMIN 4.2  3.7 3.6 3.6   Recent Labs  Lab 12/07/19 0640  LIPASE 48   No results for input(s): AMMONIA in the last 168 hours. Coagulation Profile: Recent Labs  Lab 12/08/19 0454  INR 1.3*   Cardiac Enzymes: No results for input(s): CKTOTAL, CKMB, CKMBINDEX, TROPONINI in the last 168 hours. BNP (last 3 results) No results for input(s): PROBNP in the last 8760 hours. HbA1C: No results for input(s): HGBA1C in the last 72 hours. CBG: No results for input(s): GLUCAP in the last 168 hours. Lipid Profile: No results for input(s): CHOL, HDL, LDLCALC, TRIG, CHOLHDL, LDLDIRECT in the last 72 hours. Thyroid Function Tests: No results for input(s): TSH, T4TOTAL, FREET4, T3FREE, THYROIDAB in the last 72 hours. Anemia Panel: No results for input(s): VITAMINB12, FOLATE, FERRITIN, TIBC, IRON, RETICCTPCT in the last 72 hours. Sepsis Labs: No results for input(s): PROCALCITON, LATICACIDVEN in the last 168  hours.  Recent Results (from the past 240 hour(s))  SARS Coronavirus 2 by RT PCR (hospital order, performed in [facility] hospital lab) Nasopharyngeal Nasopharyngeal Swab     Status: None   Collection Time: 12/07/19  9:47 AM   Specimen: Nasopharyngeal Swab  Result Value Ref Range Status   SARS Coronavirus 2 NEGATIVE NEGATIVE Final    Comment: (NOTE) SARS-CoV-2 target nucleic acids are NOT DETECTED.  The SARS-CoV-2 RNA is generally detectable in upper and lower respiratory specimens during the acute phase of infection. The lowest concentration of SARS-CoV-2 viral copies this assay can detect is 250 copies / mL. A negative result does not preclude SARS-CoV-2 infection and should not be used as the sole basis for treatment or other patient management decisions.  A negative result may occur with improper specimen collection / handling, submission of specimen other than nasopharyngeal swab, presence of viral mutation(s) within the areas targeted by this assay, and inadequate number of viral copies (<250 copies / mL). A negative result must be combined with clinical observations, patient history, and epidemiological information.  Fact Sheet for Patients:   StrictlyIdeas.no  Fact Sheet for Healthcare Providers: BankingDealers.co.za  This test is not yet approved or  cleared by the [place] FDA and has been authorized for detection and/or diagnosis of SARS-CoV-2 by FDA under an Emergency Use Authorization (EUA).  This EUA will remain in effect (meaning this test can be used) for the duration of the COVID-19 declaration under Section 564(b)(1) of the Act, 21 U.S.C. section 360bbb-3(b)(1), unless the authorization is terminated or revoked sooner.  Performed at [facility], [place] [address], [place], [place] [address]   Surgical pcr screen     Status: None   Collection Time: 12/11/19  6:25 AM   Specimen: Nasal  Mucosa; Nasal Swab  Result Value Ref Range Status   MRSA, PCR NEGATIVE NEGATIVE Final   Staphylococcus aureus NEGATIVE NEGATIVE Final    Comment: (NOTE) The Xpert SA Assay (FDA approved for NASAL specimens in patients 81 years of age and older), is one component of a comprehensive surveillance program. It is not intended to diagnose infection nor to guide or monitor treatment. Performed at [facility], [place] [address] [place], [place] [address]      Radiology Studies: No results found.  Scheduled Meds: . amLODipine  10 mg Oral Daily  . aspirin EC  81 mg Oral Daily  . calcitRIOL  0.25 mcg Oral Daily  . calcium carbonate  1 tablet Oral TID  . carvedilol  25 mg Oral BID WC  . Chlorhexidine  Gluconate Cloth  6 each Topical Q0600  . docusate sodium  100 mg Oral BID  . heparin  5,000 Units Subcutaneous Q8H  . hydrALAZINE  100 mg Oral Q8H  . isosorbide dinitrate  20 mg Oral TID  . multivitamin with minerals  1 tablet Oral Daily  . rosuvastatin  10 mg Oral Daily  . sevelamer carbonate  1,600 mg Oral TID WC  . sodium chloride flush  10-40 mL Intracatheter Q12H  . sodium chloride flush  3 mL Intravenous Q12H   Continuous Infusions: . sodium chloride    . ferric gluconate (FERRLECIT/NULECIT) IV      LOS: 6 days   Time spent: 31min  Jakaylah Schlafer C Etherine Mackowiak, DO [facility]  If 7PM-7AM, please contact night-coverage [email or web address]  12/13/2019, 7:55 AM

## 2019-12-13 NOTE — Progress Notes (Signed)
RN called and spoke with HD and was told that pt will have dialysis in the am and not tonight.   Eleanora Neighbor, RN

## 2019-12-14 LAB — CBC
HCT: 29.7 % — ABNORMAL LOW (ref 39.0–52.0)
Hemoglobin: 9.2 g/dL — ABNORMAL LOW (ref 13.0–17.0)
MCH: 27.2 pg (ref 26.0–34.0)
MCHC: 31 g/dL (ref 30.0–36.0)
MCV: 87.9 fL (ref 80.0–100.0)
Platelets: 266 10*3/uL (ref 150–400)
RBC: 3.38 MIL/uL — ABNORMAL LOW (ref 4.22–5.81)
RDW: 14.4 % (ref 11.5–15.5)
WBC: 9.7 10*3/uL (ref 4.0–10.5)
nRBC: 0 % (ref 0.0–0.2)

## 2019-12-14 LAB — RENAL FUNCTION PANEL
Albumin: 3.7 g/dL (ref 3.5–5.0)
Anion gap: 12 (ref 5–15)
BUN: 65 mg/dL — ABNORMAL HIGH (ref 6–20)
CO2: 23 mmol/L (ref 22–32)
Calcium: 9 mg/dL (ref 8.9–10.3)
Chloride: 99 mmol/L (ref 98–111)
Creatinine, Ser: 8.28 mg/dL — ABNORMAL HIGH (ref 0.61–1.24)
GFR calc Af Amer: 8 mL/min — ABNORMAL LOW (ref >60)
GFR calc non Af Amer: 7 mL/min — ABNORMAL LOW (ref >60)
Glucose, Bld: 91 mg/dL (ref 70–99)
Phosphorus: 6.8 mg/dL — ABNORMAL HIGH (ref 2.5–4.6)
Potassium: 4.6 mmol/L (ref 3.5–5.1)
Sodium: 134 mmol/L — ABNORMAL LOW (ref 135–145)

## 2019-12-14 LAB — GLUCOSE, CAPILLARY: Glucose-Capillary: 108 mg/dL — ABNORMAL HIGH (ref 70–99)

## 2019-12-14 MED ORDER — SODIUM CHLORIDE 0.9 % IV SOLN
125.0000 mg | INTRAVENOUS | Status: AC
  Filled 2019-12-14: qty 10

## 2019-12-14 MED ORDER — OXYCODONE-ACETAMINOPHEN 5-325 MG PO TABS
ORAL_TABLET | ORAL | Status: AC
  Administered 2019-12-14: 1 via ORAL
  Filled 2019-12-14: qty 1

## 2019-12-14 MED ORDER — SEVELAMER CARBONATE 800 MG PO TABS
1600.0000 mg | ORAL_TABLET | Freq: Three times a day (TID) | ORAL | 0 refills | Status: DC
Start: 2019-12-14 — End: 2020-06-28

## 2019-12-14 MED ORDER — HEPARIN SODIUM (PORCINE) 1000 UNIT/ML IJ SOLN
INTRAMUSCULAR | Status: AC
  Administered 2019-12-14: 1000 [IU]
  Filled 2019-12-14: qty 1

## 2019-12-14 MED ORDER — ASPIRIN 81 MG PO TBEC
81.0000 mg | DELAYED_RELEASE_TABLET | Freq: Every day | ORAL | 11 refills | Status: DC
Start: 2019-12-15 — End: 2020-07-04

## 2019-12-14 MED ORDER — CALCITRIOL 0.25 MCG PO CAPS
ORAL_CAPSULE | ORAL | Status: AC
  Administered 2019-12-14: 0.25 ug via ORAL
  Filled 2019-12-14: qty 1

## 2019-12-14 MED ORDER — OXYCODONE-ACETAMINOPHEN 5-325 MG PO TABS
1.0000 | ORAL_TABLET | ORAL | 0 refills | Status: DC | PRN
Start: 2019-12-14 — End: 2019-12-14

## 2019-12-14 MED ORDER — OXYCODONE-ACETAMINOPHEN 5-325 MG PO TABS
1.0000 | ORAL_TABLET | ORAL | 0 refills | Status: AC | PRN
Start: 2019-12-14 — End: 2019-12-17

## 2019-12-14 MED ORDER — CARVEDILOL 25 MG PO TABS: 25.0000 mg | ORAL_TABLET | Freq: Two times a day (BID) | ORAL | 0 refills | Status: DC

## 2019-12-14 MED FILL — CARVEDILOL 25 MG TABLET: 25 | 30 days supply | Qty: 60 | Fill #0

## 2019-12-14 MED FILL — SEVELAMER CARBONATE 800 MG: 800 | 30 days supply | Qty: 180 | Fill #0

## 2019-12-14 NOTE — Plan of Care (Signed)
  Problem: Health Behavior/Discharge Planning: Goal: Ability to manage health-related needs will improve Outcome: Progressing   

## 2019-12-14 NOTE — Progress Notes (Addendum)
[facility] Progress Note   Subjective:   Patient seen and examined at bedside during dialysis.  Tolerating well so far.  Some pain and swelling in LUE around new AVF.  Complaints of constipation. No other complaints.  Denies CP, SOB, edema, n/v/d, weakness, dizziness and fatigue.   Objective Vitals:   12/14/19 0711 12/14/19 0718 12/14/19 0730 12/14/19 0800  BP: (!) 143/86 (!) 147/81 137/84 (!) 141/81  Pulse: 88 83 77 76  Resp: 14 13 14 14   Temp: 98.5 F (36.9 C)     TempSrc: Oral     SpO2: 96%     Weight: 118.6 kg     Height:       Physical Exam General:well appearing male in NAD Heart:RRR Lungs:CTAB Abdomen:soft, NT, mildly distended Extremities:no LE edema Dialysis Access: TDC in use, LU AVF +weak bruit, no thrill   Filed Weights   12/11/19 1655 12/12/19 2111 12/14/19 0711  Weight: 118 kg 118 kg 118.6 kg    Intake/Output Summary (Last 24 hours) at 12/14/2019 0824 Last data filed at 12/14/2019 0601 Gross per 24 hour  Intake 600 ml  Output 525 ml  Net 75 ml    Additional Objective Labs: Basic Metabolic Panel: Recent Labs  Lab 12/10/19 0823 12/10/19 0823 12/11/19 1627 12/12/19 2039 12/14/19 0734  NA 139   < > 137 136 134*  K 5.3*   < > 4.6 4.4 4.6  CL 107   < > 101 97* 99  CO2 22   < > 23 24 23   GLUCOSE 94   < > 105* 97 91  BUN 84*   < > 63* 51* 65*  CREATININE 7.93*   < > 7.23* 6.98* 8.28*  CALCIUM 8.1*   < > 8.2* 8.6* 9.0  PHOS 5.4*  --  6.2*  --  6.8*   < > = values in this interval not displayed.   Liver Function Tests: Recent Labs  Lab 12/08/19 0454 12/08/19 0454 12/10/19 0823 12/11/19 1627 12/14/19 0734  AST 13*  --   --   --   --   ALT 12  --   --   --   --   ALKPHOS 36*  --   --   --   --   BILITOT 0.4  --   --   --   --   PROT 7.4  --   --   --   --   ALBUMIN 3.7   < > 3.6 3.6 3.7   < > = values in this interval not displayed.   CBC: Recent Labs  Lab 12/08/19 0454 12/08/19 0454 12/10/19 0822 12/10/19 0822  12/11/19 1627 12/12/19 2039 12/14/19 0745  WBC 8.2   < > 10.0   < > 10.0 10.5 9.7  HGB 9.0*   < > 9.0*   < > 9.1* 9.8* 9.2*  HCT 29.1*   < > 30.0*   < > 29.7* 32.1* 29.7*  MCV 88.2  --  87.5  --  87.6 86.3 87.9  PLT 267   < > 265   < > 254 263 266   < > = values in this interval not displayed.   Blood Culture    Component Value Date/Time   SDES URINE, CLEAN CATCH 05/22/2016 2028   [place] NONE 05/22/2016 2028   CULT NO GROWTH 05/22/2016 2028   REPTSTATUS 05/24/2016 FINAL 05/22/2016 2028    Studies/Results: No results found.  Medications: . sodium chloride    .  ferric gluconate (FERRLECIT/NULECIT) IV     . amLODipine  10 mg Oral Daily  . aspirin EC  81 mg Oral Daily  . calcitRIOL  0.25 mcg Oral Daily  . calcium carbonate  1 tablet Oral TID  . carvedilol  25 mg Oral BID WC  . Chlorhexidine Gluconate Cloth  6 each Topical Q0600  . docusate sodium  100 mg Oral BID  . heparin  5,000 Units Subcutaneous Q8H  . hydrALAZINE  100 mg Oral Q8H  . isosorbide dinitrate  20 mg Oral TID  . multivitamin with minerals  1 tablet Oral Daily  . rosuvastatin  10 mg Oral Daily  . sevelamer carbonate  1,600 mg Oral TID WC  . sodium chloride flush  10-40 mL Intracatheter Q12H  . sodium chloride flush  3 mL Intravenous Q12H    Dialysis Orders:  New start 1st HD 9/23   Assessment/Plan: 1. ESRD -new ESRD 2/2 PCKD s/p placement of 1st stage left BVT 9/24 - using TDC - Has been on TTS schedule, off schedule today due to increased patient census and limited staff on Sunday.  K 4.6  Next HD tomorrow to get back on schedule.  2. Anemia - hgb 9.2 today. tsat 9% ferritin 41.  Iron load initiated, given 250mg  Ferrlicit on 9/9, will complete Fe course prior to starting ESA.  3. Secondary hyperparathyroidism - iPTH 101 9.9 on  calcitriol 0.25, phos 6.8 - start binders renvela 2 ac 4. HTN/volume - serially lowering of weight should help improve blood pressure.  Does not appear grossly volume  overloaded.  Plan for UF 2L today. BP improved this AM. 5 .Nutrition - alb 3.6, renal diet w.fluid restrictions. Renal Vit.  6. OSA/obesity/CPAP 7. Pain control - per primary - seems disproportionate especially at multiple sites 8. Constipation - enema added to current regimen 0. Disp - awaiting confirmation for acceptance into outpatient dialysis - hopefully we can confirm this today  Jen Mow, PA-C [place] 12/14/2019,8:24 AM  LOS: 7 days

## 2019-12-14 NOTE — Procedures (Signed)
Seen and examined on dialysis.  Blood pressure 127/77 and HR 72.  Tolerating goal.  RIJ tunneled catheter.  Left AVF with bruit, no thrill and swelling noted.   Claudia Desanctis, MD 12/14/2019 8:28 AM

## 2019-12-14 NOTE — TOC Transition Note (Signed)
Transition of Care [facility]) - CM/SW Discharge Note   Patient Details  Name: Michael [name] MRN: [id number] Date of Birth: [birth year]  [facility] CM/SW Contact:  Bartholomew Crews, RN Phone Number: [phone] 12/14/2019, 5:50 PM   Clinical Narrative:     Notified by nursing that patient unable to afford pain medication. Eligible for override d/t having had surgery this admission and need for pain medication related to surgery. [place] letter given to nursing to give to patient. No other TOC needs.   Final next level of care: Home/Self Care Barriers to Discharge: No Barriers Identified   Patient Goals and CMS Choice     Choice offered to / list presented to : NA  Discharge Placement                       Discharge Plan and Services In-house Referral: Clinical Social Work Discharge Planning Services: CM Consult                                 Social Determinants of Health (SDOH) Interventions     Readmission Risk Interventions No flowsheet data found.

## 2019-12-14 NOTE — Discharge Summary (Signed)
Physician Discharge Summary  Michael [name] UUV:[id number] DOB: 20-Jan-1976 DOA: 12/07/2019  PCP: [name], FNP  Admit date: 12/07/2019 Discharge date: 12/14/2019  Admitted From: Home Disposition: Home  Recommendations for Outpatient Follow-up:  1. Follow up with PCP in 1-2 weeks 2. Follow with nephrology as scheduled, continue dialysis per new schedule  Home Health: None Equipment/Devices: None  Discharge Condition: Stable CODE STATUS: Full Diet recommendation: Renal diet  Brief/Interim Summary: 44 year old male with history of CKD-5secondary to ADPKD followed by Dr. Hollie Salk, OSA on CPAP, HTN, recent hospitalization from 9/8-9/15 for hypertensive emergency and AKI on CKD-5presenting with multiple complaints including chest pain, shortness of breath, diffuse abdominal pain, nausea, vomiting, constipation, paresthesia in hands and feet.The plan was to follow-up with nephrology and start HD in about 2 months but he never followed up. Echo at that time with EF of 60 to 65% and concern for infiltrative process. Cardiology consulted. He had a nuclear stress test that showed unchanged risk, andcardiology recommended medical management. In ED, afebrile. BP 174/106. On room air.KV425. CO2 17. AG 20.Cr8.15 (7.98on09/15).BUN 94. High-sensitivity troponin 40>34. Hgb 10.2. Two view CXR and CTHwithout acute finding. Nephrology consulted. Plan is to start HD after HD cath placement.  Patient met as above with worsening CKD 5 now advanced to ESRD status post dialysis catheter placement, tolerance of hemodialysis and now has dialysis slot outpatient.  He also had fistula grafting done while in-house, will follow with vascular surgery, nephrology and PCP as scheduled.  Patient otherwise has no acute complaints at this time, labs vital signs and patient's fluid status appears to be markedly well controlled on current regimen.  Patient otherwise stable and agreeable for discharge home.  Discharge  Diagnoses:  Principal Problem:   AKI (acute kidney injury) ([place]) Active Problems:   Adult polycystic kidney disease   Essential hypertension, benign   Metabolic acidosis, increased anion gap   CKD (chronic kidney disease), stage V ([facility])   Uremia   Chest pain    Discharge Instructions  Discharge Instructions    Call MD for:  difficulty breathing, headache or visual disturbances   Complete by: As directed    Call MD for:  persistant dizziness or light-headedness   Complete by: As directed    Call MD for:  persistant nausea and vomiting   Complete by: As directed    Call MD for:  redness, tenderness, or signs of infection (pain, swelling, redness, odor or green/yellow discharge around incision site)   Complete by: As directed    Call MD for:  temperature >100.4   Complete by: As directed    Diet general   Complete by: As directed    Renal diet   Increase activity slowly   Complete by: As directed    No wound care   Complete by: As directed      Allergies as of 12/14/2019   No Known Allergies     Medication List    STOP taking these medications   allopurinol 100 MG tablet Commonly known as: ZYLOPRIM   calcium carbonate 500 MG chewable tablet Commonly known as: TUMS - dosed in mg elemental calcium   sodium bicarbonate 650 MG tablet   sodium zirconium cyclosilicate 10 g Pack packet Commonly known as: LOKELMA     TAKE these medications   acetaminophen 650 MG CR tablet Commonly known as: TYLENOL Take 650 mg by mouth daily.   amLODipine 10 MG tablet Commonly known as: NORVASC Take 1 tablet (10 mg total) by mouth  daily.   aspirin 81 MG EC tablet Take 1 tablet (81 mg total) by mouth daily. Swallow whole. Start taking on: December 15, 2019   calcitRIOL 0.25 MCG capsule Commonly known as: ROCALTROL Take 1 capsule (0.25 mcg total) by mouth daily.   camphor-menthol lotion Commonly known as: SARNA Apply topically as needed for itching.   carvedilol 25 MG  tablet Commonly known as: COREG Take 1 tablet (25 mg total) by mouth 2 (two) times daily with a meal. What changed:   medication strength  how much to take   hydrALAZINE 100 MG tablet Commonly known as: APRESOLINE Take 1 tablet (100 mg total) by mouth every 8 (eight) hours.   isosorbide dinitrate 20 MG tablet Commonly known as: ISORDIL Take 1 tablet (20 mg total) by mouth 3 (three) times daily.   multivitamin Tabs tablet Take 1 tablet by mouth daily.   nitroGLYCERIN 0.4 MG SL tablet Commonly known as: Nitrostat Place 1 tablet (0.4 mg total) under the tongue every 5 (five) minutes as needed for chest pain.   oxyCODONE-acetaminophen 5-325 MG tablet Commonly known as: PERCOCET/ROXICET Take 1 tablet by mouth every 4 (four) hours as needed for up to 3 days for severe pain.   rosuvastatin 10 MG tablet Commonly known as: Crestor Take 1 tablet (10 mg total) by mouth daily.   sevelamer carbonate 800 MG tablet Commonly known as: RENVELA Take 2 tablets (1,600 mg total) by mouth 3 (three) times daily with meals.       No Known Allergies  Consultations:  Nephrology, vascular surgery   Procedures/Studies: DG Chest 2 View  Result Date: 12/07/2019 CLINICAL DATA:  Headache and chest pressure EXAM: CHEST - 2 VIEW COMPARISON:  05/15/2019 FINDINGS: Artifact from EKG leads. There is no edema, consolidation, effusion, or pneumothorax. Normal heart size and mediastinal contours. IMPRESSION: No active cardiopulmonary disease. Electronically Signed   By: Monte Fantasia M.D.   On: 12/07/2019 07:40   DG Abd 1 View  Result Date: 11/27/2019 CLINICAL DATA:  Nausea EXAM: X-RAY ABDOMEN 1 VIEW COMPARISON:  03/28/2017 FINDINGS: The bowel gas pattern is normal. No radio-opaque calculi or other significant radiographic abnormality are seen. IMPRESSION: Negative. Electronically Signed   By: Donavan Foil M.D.   On: 11/27/2019 18:18   CT Head Wo Contrast  Result Date: 12/07/2019 CLINICAL DATA:   Headache and fatigue. Upper and lower extremity tingling EXAM: CT HEAD WITHOUT CONTRAST TECHNIQUE: Contiguous axial images were obtained from the base of the skull through the vertex without intravenous contrast. COMPARISON:  None. FINDINGS: Brain: Ventricles are normal in size and configuration. There is symmetric frontal atrophy bilaterally. Sulci elsewhere appear normal. There is no intracranial mass hemorrhage, extra-axial fluid collection, or midline shift. Brain parenchyma appears unremarkable without evident acute infarct. Vascular: No hyperdense vessel. No appreciable vascular calcification. Skull: Bony calvarium appears intact. Sinuses/Orbits: There is mucosal thickening in the posterior right maxillary antrum. There is slight mucosal thickening in several ethmoid air cells. Orbits appear symmetric bilaterally. Other: Mastoid air cells are clear. IMPRESSION: Mild symmetric frontal atrophy bilaterally. Ventricles and sulci otherwise appear normal. No mass or hemorrhage evident. No findings suggesting acute infarct. Mucosal thickening in several paranasal sinus regions noted. Electronically Signed   By: Lowella Grip III M.D.   On: 12/07/2019 09:31   CARDIAC CATHETERIZATION  Result Date: 12/09/2019 Left Heart Catheterization 12/09/19: Normal coronary arteries, tortuous vessels suggestive of hypertension with hypertensive heart disease.  Hyperdynamic LVEF at 70%.  Moderately elevated LVEDP.  30 mL contrast  utilized.  Right femoral arterial access closed with Perclose. Recommendation: Evaluation for noncardiac chest pain, chest pain probably related to hypertension and also musculoskeletal chest pain.  Abnormal EKG related to hypertension. Adrian Prows, MD, [facility] 12/09/2019, 9:30 AM Office: [phone]   NM Myocar Multi W/Spect Tamela Oddi Motion / EF  Result Date: 12/02/2019 CLINICAL DATA:  Chest pain. EXAM: MYOCARDIAL IMAGING WITH SPECT (REST AND PHARMACOLOGIC-STRESS) GATED LEFT VENTRICULAR WALL MOTION  STUDY LEFT VENTRICULAR EJECTION FRACTION TECHNIQUE: Standard myocardial SPECT imaging was performed after resting intravenous injection of 10 mCi Tc-60mtetrofosmin. Subsequently, intravenous infusion of Lexiscan was performed under the supervision of the Cardiology staff. At peak effect of the drug, 30 mCi Tc-963metrofosmin was injected intravenously and standard myocardial SPECT imaging was performed. Quantitative gated imaging was also performed to evaluate left ventricular wall motion, and estimate left ventricular ejection fraction. COMPARISON:  None. FINDINGS: Perfusion: Moderate decreased perfusion to a moderate size defect in the mid segment lateral wall which improves from stress to rest. Wall Motion: LEFT ventricular dilatation.  Mild global hypokinesia. Left Ventricular Ejection Fraction: 42 % End diastolic volume 16831l End systolic volume 97 ml IMPRESSION: 1. Moderate size region of reversible ischemia in the mid segment lateral wall. 2. Global hypokinesia and LEFT ventricular dilatation. 3. Left ventricular ejection fraction 42% 4. Non invasive risk stratification*: Intermediate *2012 Appropriate Use Criteria for Coronary Revascularization Focused Update: J Am Coll Cardiol. 205176;16(0):[email or web address]://[email or web address]?articleid=[email or web address] These results will be called to the ordering clinician or representative by the Radiologist Assistant, and communication documented in the PACS or ClFrontier Oil CorporationElectronically Signed   By: StSuzy Bouchard.D.   On: 12/02/2019 13:40   IR Fluoro Guide CV Line Right  Result Date: 12/09/2019 INDICATION: End-stage renal disease. In need of durable intravenous access for the initiation hemodialysis. EXAM: TUNNELED CENTRAL VENOUS HEMODIALYSIS CATHETER PLACEMENT WITH ULTRASOUND AND FLUOROSCOPIC GUIDANCE MEDICATIONS: Ancef 2 gm IV . The antibiotic was given in an appropriate time interval prior to skin puncture. ANESTHESIA/SEDATION: Moderate  (conscious) sedation was employed during this procedure. A total of Versed 2 mg and Fentanyl 50 mcg was administered intravenously. Moderate Sedation Time: 14 minutes. The patient's level of consciousness and vital signs were monitored continuously by radiology nursing throughout the procedure under my direct supervision. FLUOROSCOPY TIME:  54 seconds (3326.9Gy) COMPLICATIONS: None immediate. PROCEDURE: Informed written consent was obtained from the patient after a discussion of the risks, benefits, and alternatives to treatment. Questions regarding the procedure were encouraged and answered. The right neck and chest were prepped with chlorhexidine in a sterile fashion, and a sterile drape was applied covering the operative field. Maximum barrier sterile technique with sterile gowns and gloves were used for the procedure. A timeout was performed prior to the initiation of the procedure. After creating a small venotomy incision, a micropuncture kit was utilized to access the internal jugular vein. Real-time ultrasound guidance was utilized for vascular access including the acquisition of a permanent ultrasound image documenting patency of the accessed vessel. The microwire was utilized to measure appropriate catheter length. A stiff Glidewire was advanced to the level of the IVC and the micropuncture sheath was exchanged for a peel-away sheath. A palindrome tunneled hemodialysis catheter measuring 23 cm from tip to cuff was tunneled in a retrograde fashion from the anterior chest wall to the venotomy incision. The catheter was then placed through the peel-away sheath with tips ultimately positioned within the superior aspect of the right atrium. Final catheter positioning was confirmed and documented with  a spot radiographic image. The catheter aspirates and flushes normally. The catheter was flushed with appropriate volume heparin dwells. The catheter exit site was secured with a 0-Prolene retention suture. The  venotomy incision was closed with Dermabond and Steri-strips. Dressings were applied. The patient tolerated the procedure well without immediate post procedural complication. IMPRESSION: Successful placement of 23 cm tip to cuff tunneled hemodialysis catheter via the right internal jugular vein with tips terminating within the superior aspect of the right atrium. The catheter is ready for immediate use. Electronically Signed   By: Sandi Mariscal M.D.   On: 12/09/2019 17:12   IR US Guide Vasc Access Right  Result Date: 12/09/2019 INDICATION: End-stage renal disease. In need of durable intravenous access for the initiation hemodialysis. EXAM: TUNNELED CENTRAL VENOUS HEMODIALYSIS CATHETER PLACEMENT WITH ULTRASOUND AND FLUOROSCOPIC GUIDANCE MEDICATIONS: Ancef 2 gm IV . The antibiotic was given in an appropriate time interval prior to skin puncture. ANESTHESIA/SEDATION: Moderate (conscious) sedation was employed during this procedure. A total of Versed 2 mg and Fentanyl 50 mcg was administered intravenously. Moderate Sedation Time: 14 minutes. The patient's level of consciousness and vital signs were monitored continuously by radiology nursing throughout the procedure under my direct supervision. FLUOROSCOPY TIME:  54 seconds (86.7 mGy) COMPLICATIONS: None immediate. PROCEDURE: Informed written consent was obtained from the patient after a discussion of the risks, benefits, and alternatives to treatment. Questions regarding the procedure were encouraged and answered. The right neck and chest were prepped with chlorhexidine in a sterile fashion, and a sterile drape was applied covering the operative field. Maximum barrier sterile technique with sterile gowns and gloves were used for the procedure. A timeout was performed prior to the initiation of the procedure. After creating a small venotomy incision, a micropuncture kit was utilized to access the internal jugular vein. Real-time ultrasound guidance was utilized for  vascular access including the acquisition of a permanent ultrasound image documenting patency of the accessed vessel. The microwire was utilized to measure appropriate catheter length. A stiff Glidewire was advanced to the level of the IVC and the micropuncture sheath was exchanged for a peel-away sheath. A palindrome tunneled hemodialysis catheter measuring 23 cm from tip to cuff was tunneled in a retrograde fashion from the anterior chest wall to the venotomy incision. The catheter was then placed through the peel-away sheath with tips ultimately positioned within the superior aspect of the right atrium. Final catheter positioning was confirmed and documented with a spot radiographic image. The catheter aspirates and flushes normally. The catheter was flushed with appropriate volume heparin dwells. The catheter exit site was secured with a 0-Prolene retention suture. The venotomy incision was closed with Dermabond and Steri-strips. Dressings were applied. The patient tolerated the procedure well without immediate post procedural complication. IMPRESSION: Successful placement of 23 cm tip to cuff tunneled hemodialysis catheter via the right internal jugular vein with tips terminating within the superior aspect of the right atrium. The catheter is ready for immediate use. Electronically Signed   By: Sandi Mariscal M.D.   On: 12/09/2019 17:12   DG Abd 2 Views  Result Date: 12/07/2019 CLINICAL DATA:  Constipation, N/V today EXAM: ABDOMEN - 2 VIEW COMPARISON:  11/27/2019 FINDINGS: Visualized lung bases clear. No free air. Normal bowel gas pattern. No abnormal abdominal calcifications. Regional bones unremarkable. IMPRESSION: Negative. Electronically Signed   By: Lucrezia Europe M.D.   On: 12/07/2019 08:15   ECHOCARDIOGRAM COMPLETE  Result Date: 11/30/2019    ECHOCARDIOGRAM REPORT   Patient Name:  [name] Date of Exam: 11/30/2019 Medical Rec #:  [id number]    Height:       73.0 in Accession #:    [id number]    Weight:       262.3 lb Date of Birth:  12/07/1975     BSA:          2.414 m Patient Age:    46 years     BP:           142/85 mmHg Patient Gender: M            HR:           92 bpm. Exam Location:  Inpatient Procedure: 2D Echo, Cardiac Doppler and Color Doppler Indications:    Chest pain  History:        Patient has no prior history of Echocardiogram examinations.                 Risk Factors:Former Smoker, Sleep Apnea and Hypertension. CKD.                 Polycystic kidney disease.  Sonographer:    Clayton Lefort RDCS (AE) Referring Phys: [id number] JESSICA Alison Stalling  Sonographer Comments: Global longitudinal strain was attempted. IMPRESSIONS  1. Left ventricular ejection fraction, by estimation, is 60 to 65%. The left ventricle has normal function. The left ventricle has no regional wall motion abnormalities. There is severe left ventricular wall thickening. Wall thickness is 2.5 cm average,  concerning for infiltrative cardiomyopathy such as amyloidosis. Impaired diastolic function with indeterminate LV filling pressure.  2. The aortic valve is tricuspid. Aortic valve regurgitation is not visualized.  3. The pericardial effusion is localized near the right ventricle and circumferential.  4. The inferior vena cava is dilated in size with >50% respiratory variability, suggesting right atrial pressure of 8 mmHg.  5. Right ventricular systolic function is normal. The right ventricular size is normal. There is normal pulmonary artery systolic pressure. The estimated right ventricular systolic pressure is 46.9 mmHg.  6. The mitral valve is grossly normal. Trivial mitral valve regurgitation. Conclusion(s)/Recommendation(s): Consider further work-up for infiltrative cardiomyopathy or less likely HCM with cMRI or PYP scan. FINDINGS  Left Ventricle: Left ventricular ejection fraction, by estimation, is 60 to 65%. The left ventricle has normal function. The left ventricle has no regional wall motion abnormalities. The left ventricular  internal cavity size was small. There is severe left ventricular hypertrophy. Left ventricular diastolic parameters are consistent with Grade I diastolic dysfunction (impaired relaxation). Indeterminate filling pressures. Right Ventricle: The right ventricular size is normal. No increase in right ventricular wall thickness. Right ventricular systolic function is normal. There is normal pulmonary artery systolic pressure. The tricuspid regurgitant velocity is 2.55 m/s, and  with an assumed right atrial pressure of 8 mmHg, the estimated right ventricular systolic pressure is 62.9 mmHg. Left Atrium: Left atrial size was normal in size. Right Atrium: Right atrial size was normal in size. Pericardium: Trivial pericardial effusion is present. The pericardial effusion is localized near the right ventricle and circumferential. Mitral Valve: The mitral valve is grossly normal. Trivial mitral valve regurgitation. MV peak gradient, 3.0 mmHg. The mean mitral valve gradient is 1.0 mmHg. Tricuspid Valve: The tricuspid valve is grossly normal. Tricuspid valve regurgitation is trivial. Aortic Valve: The aortic valve is tricuspid. Aortic valve regurgitation is not visualized. Aortic valve mean gradient measures 9.0 mmHg. Aortic valve peak gradient measures 15.4 mmHg. Pulmonic Valve: The pulmonic valve was grossly normal. Pulmonic  valve regurgitation is trivial. Aorta: The aortic root and ascending aorta are structurally normal, with no evidence of dilitation. Venous: The inferior vena cava is dilated in size with greater than 50% respiratory variability, suggesting right atrial pressure of 8 mmHg. IAS/Shunts: No atrial level shunt detected by color flow Doppler.  LEFT VENTRICLE PLAX 2D LVIDd:         4.10 cm LVIDs:         2.70 cm LV PW:         2.60 cm LV IVS:        2.40 cm LVOT diam:     2.30 cm LVOT Area:     4.15 cm  RIGHT VENTRICLE          IVC RV Basal diam:  3.10 cm  IVC diam: 2.20 cm TAPSE (M-mode): 2.3 cm LEFT ATRIUM            Index       RIGHT ATRIUM           Index LA diam:      3.20 cm 1.33 cm/m  RA Area:     12.80 cm LA Vol (A4C): 52.0 ml 21.54 ml/m RA Volume:   26.00 ml  10.77 ml/m  AORTIC VALVE AV Vmax:      196.00 cm/s AV Vmean:     135.000 cm/s AV VTI:       0.312 m AV Peak Grad: 15.4 mmHg AV Mean Grad: 9.0 mmHg  AORTA Ao Root diam: 3.60 cm Ao Asc diam:  3.10 cm MITRAL VALVE               TRICUSPID VALVE MV Area (PHT): 2.34 cm    TR Peak grad:   26.0 mmHg MV Peak grad:  3.0 mmHg    TR Vmax:        255.00 cm/s MV Mean grad:  1.0 mmHg MV Vmax:       0.87 m/s    SHUNTS MV Vmean:      55.1 cm/s   Systemic Diam: 2.30 cm MV Decel Time: 324 msec MV E velocity: 67.90 cm/s MV A velocity: 56.20 cm/s MV E/A ratio:  1.21 Lyman Bishop MD Electronically signed by Lyman Bishop MD Signature Date/Time: 11/30/2019/4:11:47 PM    Final    VAS [place] UPPER EXT VEIN MAPPING (PRE-OP AVF)  Result Date: 11/27/2019 UPPER EXTREMITY VEIN MAPPING Performing Technologist: Griffin Basil RCT RDMS  Examination Guidelines: A complete evaluation includes B-mode imaging, spectral Doppler, color Doppler, and power Doppler as needed of all accessible portions of each vessel. Bilateral testing is considered an integral part of a complete examination. Limited examinations for reoccurring indications may be performed as noted. +-----------------+-------------+----------+---------+ Right Cephalic   Diameter (cm)Depth (cm)Findings  +-----------------+-------------+----------+---------+ Shoulder             0.15        2.13             +-----------------+-------------+----------+---------+ Prox upper arm       0.24        2.40             +-----------------+-------------+----------+---------+ Mid upper arm        0.24        1.30   branching +-----------------+-------------+----------+---------+ Dist upper arm       0.28        1.00             +-----------------+-------------+----------+---------+ Antecubital fossa    0.16  0.60   branching +-----------------+-------------+----------+---------+ Prox forearm         0.14        0.21             +-----------------+-------------+----------+---------+ Mid forearm          0.16        0.21             +-----------------+-------------+----------+---------+ Dist forearm         0.16        0.20             +-----------------+-------------+----------+---------+ Wrist                0.17        0.27             +-----------------+-------------+----------+---------+ +-----------------+-------------+----------+---------+ Right Basilic    Diameter (cm)Depth (cm)Findings  +-----------------+-------------+----------+---------+ Shoulder             0.57                         +-----------------+-------------+----------+---------+ Prox upper arm       0.56                         +-----------------+-------------+----------+---------+ Mid upper arm        0.56                         +-----------------+-------------+----------+---------+ Dist upper arm       0.69                         +-----------------+-------------+----------+---------+ Antecubital fossa    0.46               branching +-----------------+-------------+----------+---------+ Prox forearm         0.33               branching +-----------------+-------------+----------+---------+ Mid forearm          0.36                         +-----------------+-------------+----------+---------+ Distal forearm       0.23                         +-----------------+-------------+----------+---------+ Wrist                0.19                         +-----------------+-------------+----------+---------+ +-----------------+-------------+----------+---------+ Left Cephalic    Diameter (cm)Depth (cm)Findings  +-----------------+-------------+----------+---------+ Shoulder             0.24        1.90             +-----------------+-------------+----------+---------+ Prox  upper arm       0.21        0.53             +-----------------+-------------+----------+---------+ Mid upper arm        0.23        0.50             +-----------------+-------------+----------+---------+ Dist upper arm       0.26        0.51   branching +-----------------+-------------+----------+---------+ Antecubital fossa    0.19  0.39             +-----------------+-------------+----------+---------+ Prox forearm         0.15        0.21             +-----------------+-------------+----------+---------+ Mid forearm          0.10        0.19             +-----------------+-------------+----------+---------+ Dist forearm         0.13        0.21             +-----------------+-------------+----------+---------+ Wrist                0.14        0.30             +-----------------+-------------+----------+---------+ +-----------------+-------------+----------+---------+ Left Basilic     Diameter (cm)Depth (cm)Findings  +-----------------+-------------+----------+---------+ Shoulder             0.52                         +-----------------+-------------+----------+---------+ Prox upper arm       0.34                         +-----------------+-------------+----------+---------+ Mid upper arm        0.35                         +-----------------+-------------+----------+---------+ Dist upper arm       0.39                         +-----------------+-------------+----------+---------+ Antecubital fossa    0.39               branching +-----------------+-------------+----------+---------+ Prox forearm         0.30                         +-----------------+-------------+----------+---------+ Mid forearm          0.26                         +-----------------+-------------+----------+---------+ Distal forearm       0.19                         +-----------------+-------------+----------+---------+ Wrist                0.25                          +-----------------+-------------+----------+---------+ *See table(s) above for measurements and observations.  Diagnosing physician: Monica Martinez MD Electronically signed by Monica Martinez MD on 11/27/2019 at 4:40:42 PM.    Final       Subjective: No acute issues or events overnight, denies nausea, vomiting, diarrhea, constipation, headache, fevers, chills.   Discharge Exam: Vitals:   12/14/19 1535 12/14/19 1538  BP: 134/80 131/88  Pulse: 85 86  Resp:    Temp:    SpO2: 96% 94%   Vitals:   12/14/19 1532 12/14/19 1534 12/14/19 1535 12/14/19 1538  BP: 111/76 133/81 134/80 131/88  Pulse: 77 84 85 86  Resp:      Temp:      TempSrc:  SpO2: 94% 94% 96% 94%  Weight:      Height:       General:  Pleasantly resting in bed receiving dialysis, No acute distress. HEENT:  Normocephalic atraumatic.  Sclerae nonicteric, noninjected.  Extraocular movements intact bilaterally. Neck:  Without mass or deformity.  Trachea is midline.  Right IJ dialysis catheter bandage and site clean dry intact. Lungs:  Clear to auscultate bilaterally without rhonchi, wheeze, or rales. Heart:  Regular rate and rhythm.  Without murmurs, rubs, or gallops. Abdomen:  Soft, nontender, nondistended.  Without guarding or rebound. Extremities: Without cyanosis, clubbing, edema, or obvious deformity.  Left upper extremity medial aspect 2 small 4 cm incisions clean dry without erythema or bleeding. Vascular:  Dorsalis pedis and posterior tibial pulses palpable bilaterally. Skin:  Warm and dry, no erythema, no ulcerations.  The results of significant diagnostics from this hospitalization (including imaging, microbiology, ancillary and laboratory) are listed below for reference.     Microbiology: Recent Results (from the past 240 hour(s))  SARS Coronavirus 2 by RT PCR (hospital order, performed in [facility] hospital lab) Nasopharyngeal Nasopharyngeal Swab     Status: None   Collection  Time: 12/07/19  9:47 AM   Specimen: Nasopharyngeal Swab  Result Value Ref Range Status   SARS Coronavirus 2 NEGATIVE NEGATIVE Final    Comment: (NOTE) SARS-CoV-2 target nucleic acids are NOT DETECTED.  The SARS-CoV-2 RNA is generally detectable in upper and lower respiratory specimens during the acute phase of infection. The lowest concentration of SARS-CoV-2 viral copies this assay can detect is 250 copies / mL. A negative result does not preclude SARS-CoV-2 infection and should not be used as the sole basis for treatment or other patient management decisions.  A negative result may occur with improper specimen collection / handling, submission of specimen other than nasopharyngeal swab, presence of viral mutation(s) within the areas targeted by this assay, and inadequate number of viral copies (<250 copies / mL). A negative result must be combined with clinical observations, patient history, and epidemiological information.  Fact Sheet for Patients:   StrictlyIdeas.no  Fact Sheet for Healthcare Providers: BankingDealers.co.za  This test is not yet approved or  cleared by the [place] FDA and has been authorized for detection and/or diagnosis of SARS-CoV-2 by FDA under an Emergency Use Authorization (EUA).  This EUA will remain in effect (meaning this test can be used) for the duration of the COVID-19 declaration under Section 564(b)(1) of the Act, 21 U.S.C. section 360bbb-3(b)(1), unless the authorization is terminated or revoked sooner.  Performed at [facility], [place] [address], [place], [place] [address]   Surgical pcr screen     Status: None   Collection Time: 12/11/19  6:25 AM   Specimen: Nasal Mucosa; Nasal Swab  Result Value Ref Range Status   MRSA, PCR NEGATIVE NEGATIVE Final   Staphylococcus aureus NEGATIVE NEGATIVE Final    Comment: (NOTE) The Xpert SA Assay (FDA approved for NASAL specimens  in patients 80 years of age and older), is one component of a comprehensive surveillance program. It is not intended to diagnose infection nor to guide or monitor treatment. Performed at [facility], [place] [address], [place], [place] [address]      Labs: BNP (last 3 results) No results for input(s): BNP in the last 8760 hours. Basic Metabolic Panel: Recent Labs  Lab 12/09/19 0503 12/10/19 0823 12/11/19 1627 12/12/19 2039 12/14/19 0734  NA 139 139 137 136 134*  K 4.8  5.3* 4.6 4.4 4.6  CL 106 107 101 97* 99  CO2 20* _0 GLUCOSE 95 94 105* 97 91  BUN 89* 84* 63* 51* 65*  CREATININE 8.15* 7.93* 7.23* 6.98* 8.28*  CALCIUM 8.0* 8.1* 8.2* 8.6* 9.0  PHOS  --  5.4* 6.2*  --  6.8*   Liver Function Tests: Recent Labs  Lab 12/08/19 0454 12/10/19 0823 12/11/19 1627 12/14/19 0734  AST 13*  --   --   --   ALT 12  --   --   --   ALKPHOS 36*  --   --   --   BILITOT 0.4  --   --   --   PROT 7.4  --   --   --   ALBUMIN 3.7 3.6 3.6 3.7   No results for input(s): LIPASE, AMYLASE in the last 168 hours. No results for input(s): AMMONIA in the last 168 hours. CBC: Recent Labs  Lab 12/08/19 0454 12/10/19 0822 12/11/19 1627 12/12/19 2039 12/14/19 0745  WBC 8.2 10.0 10.0 10.5 9.7  HGB 9.0* 9.0* 9.1* 9.8* 9.2*  HCT 29.1* 30.0* 29.7* 32.1* 29.7*  MCV 88.2 87.5 87.6 86.3 87.9  PLT 267 265 254 263 266   Cardiac Enzymes: No results for input(s): CKTOTAL, CKMB, CKMBINDEX, TROPONINI in the last 168 hours. BNP: Invalid input(s): POCBNP CBG: Recent Labs  Lab 12/14/19 1224  GLUCAP 108*   D-Dimer No results for input(s): DDIMER in the last 72 hours. Hgb A1c No results for input(s): HGBA1C in the last 72 hours. Lipid Profile No results for input(s): CHOL, HDL, LDLCALC, TRIG, CHOLHDL, LDLDIRECT in the last 72 hours. Thyroid function studies No results for input(s): TSH, T4TOTAL, T3FREE, THYROIDAB in the last 72 hours.  Invalid input(s): FREET3 Anemia work up No  results for input(s): VITAMINB12, FOLATE, FERRITIN, TIBC, IRON, RETICCTPCT in the last 72 hours. Urinalysis    Component Value Date/Time   COLORURINE YELLOW 12/07/2019 1043   APPEARANCEUR HAZY (A) 12/07/2019 1043   LABSPEC 1.010 12/07/2019 1043   PHURINE 6.0 12/07/2019 1043   GLUCOSEU NEGATIVE 12/07/2019 1043   HGBUR LARGE (A) 12/07/2019 1043   BILIRUBINUR NEGATIVE 12/07/2019 1043   BILIRUBINUR neg 05/26/2019 1135   KETONESUR NEGATIVE 12/07/2019 1043   PROTEINUR 100 (A) 12/07/2019 1043   UROBILINOGEN 0.2 05/26/2019 1135   UROBILINOGEN 0.2 06/05/2017 1434   NITRITE NEGATIVE 12/07/2019 1043   LEUKOCYTESUR LARGE (A) 12/07/2019 1043   Sepsis Labs Invalid input(s): PROCALCITONIN,  WBC,  LACTICIDVEN Microbiology Recent Results (from the past 240 hour(s))  SARS Coronavirus 2 by RT PCR (hospital order, performed in [facility] hospital lab) Nasopharyngeal Nasopharyngeal Swab     Status: None   Collection Time: 12/07/19  9:47 AM   Specimen: Nasopharyngeal Swab  Result Value Ref Range Status   SARS Coronavirus 2 NEGATIVE NEGATIVE Final    Comment: (NOTE) SARS-CoV-2 target nucleic acids are NOT DETECTED.  The SARS-CoV-2 RNA is generally detectable in upper and lower respiratory specimens during the acute phase of infection. The lowest concentration of SARS-CoV-2 viral copies this assay can detect is 250 copies / mL. A negative result does not preclude SARS-CoV-2 infection and should not be used as the sole basis for treatment or other patient management decisions.  A negative result may occur with improper specimen collection / handling, submission of specimen other than nasopharyngeal swab, presence of viral mutation(s) within the areas targeted by this assay, and inadequate number of viral copies (<250 copies /  mL). A negative result must be combined with clinical observations, patient history, and epidemiological information.  Fact Sheet for Patients:    StrictlyIdeas.no  Fact Sheet for Healthcare Providers: BankingDealers.co.za  This test is not yet approved or  cleared by the [place] FDA and has been authorized for detection and/or diagnosis of SARS-CoV-2 by FDA under an Emergency Use Authorization (EUA).  This EUA will remain in effect (meaning this test can be used) for the duration of the COVID-19 declaration under Section 564(b)(1) of the Act, 21 U.S.C. section 360bbb-3(b)(1), unless the authorization is terminated or revoked sooner.  Performed at [facility], [place] [address], [place], [place] [address]   Surgical pcr screen     Status: None   Collection Time: 12/11/19  6:25 AM   Specimen: Nasal Mucosa; Nasal Swab  Result Value Ref Range Status   MRSA, PCR NEGATIVE NEGATIVE Final   Staphylococcus aureus NEGATIVE NEGATIVE Final    Comment: (NOTE) The Xpert SA Assay (FDA approved for NASAL specimens in patients 82 years of age and older), is one component of a comprehensive surveillance program. It is not intended to diagnose infection nor to guide or monitor treatment. Performed at [facility], [place] [address], [place], [place] [address]      Time coordinating discharge: Over 30 minutes  SIGNED:   Little Ishikawa, DO [facility] 12/14/2019, 3:45 PM Pager   If 7PM-7AM, please contact night-coverage [email or web address]

## 2019-12-15 ENCOUNTER — Telehealth: Payer: Self-pay | Admitting: Nephrology

## 2019-12-15 NOTE — Telephone Encounter (Signed)
Transition of Care Contact from [place]   Date of Discharge: 12/14/19 Date of Contact: 12/15/19 Method of contact: phone Talked to patient   Patient contacted to discuss transition of care form recent hospitaliztion. Patient was admitted to [facility] from 9/20 to 9/28 with the discharge diagnosis of CKD stage 5 progressed to ESRD, starting dialysis.     Medication changes were reviewed.   Patient will follow up with is outpatient dialysis center 12/16/19.   Other follow up needs include none identified.    Jen Mow, PA-C [facility] Pager: [phone]

## 2019-12-17 ENCOUNTER — Emergency Department (HOSPITAL_COMMUNITY)
Admission: EM | Admit: 2019-12-17 | Discharge: 2019-12-17 | Disposition: A | Discharge status: Home / Self Care | Payer: Medicaid Other | Attending: Emergency Medicine | Admitting: Emergency Medicine

## 2019-12-17 ENCOUNTER — Encounter (HOSPITAL_COMMUNITY): Payer: Self-pay | Admitting: Emergency Medicine

## 2019-12-17 ENCOUNTER — Emergency Department (HOSPITAL_COMMUNITY)
Admission: EM | Admit: 2019-12-17 | Discharge: 2019-12-17 | Disposition: A | Discharge status: Home / Self Care | Payer: Medicaid Other | Source: Home / Self Care

## 2019-12-17 DIAGNOSIS — Z992 Dependence on renal dialysis: Secondary | ICD-10-CM | POA: Insufficient documentation

## 2019-12-17 DIAGNOSIS — Z5321 Procedure and treatment not carried out due to patient leaving prior to being seen by health care provider: Secondary | ICD-10-CM | POA: Insufficient documentation

## 2019-12-17 DIAGNOSIS — K5900 Constipation, unspecified: Secondary | ICD-10-CM | POA: Insufficient documentation

## 2019-12-17 HISTORY — DX: Personal history of other medical treatment: Z92.89

## 2019-12-17 NOTE — ED Triage Notes (Signed)
Patient is using profanity, stating he has a "turd in his ass and everyone is moving slow"-states he has "an emergency and no one is taking him seriously"-explained to patient we have no control over how fast EMS drives and we are not going to tolerate foul language and threats-security called to triage for assistance
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
## 2019-12-17 NOTE — ED Triage Notes (Signed)
Pt here after going to [facility] and his MD office with c/o constipation pt got up in triage and went to the bathroom

## 2019-12-17 NOTE — ED Notes (Signed)
Called several times by registration and called for room w/o response

## 2019-12-17 NOTE — ED Triage Notes (Signed)
Per EMS-states constipation for 4 days-had dialysis yesterday-took a suppository 1 hour ago-unable to pass stool

## 2019-12-17 NOTE — ED Triage Notes (Signed)
Explained to patient the current atmosphere of ED-patient continues to use profanity telling security "it feels like I have a big dick in my ass" patient delaying going to lobby by explaining current medical issue to multiple staff members-patient is unwilling to listen to Probation officer, security, [employer]

## 2019-12-17 NOTE — ED Notes (Signed)
Dee from the [facility] called stating patient came to clinic seeking help-states he is in the [place] "moaning and groaning"-informed Dee patient patient was suppose to be in ED lobby waiting to be seen

## 2019-12-17 NOTE — ED Notes (Addendum)
On request, spoke to Pt about concerns, delays, and plan of care.  Pt educated that arriving EMS does not mean he immediately gets a room and explained we see patients based on acuity.  Pt verbalized understanding.  His previous behaviors were discussed and he was asked to be appropriate and respectful to staff.  Conversation ended when patient began inappropriately describing his rectal discomfort.  Pt is currently in a wheelchair located next to the restroom for convenience.

## 2019-12-17 NOTE — ED Notes (Signed)
Called pt for vitals recheck multiple times and no answer

## 2019-12-18 ENCOUNTER — Ambulatory Visit: Payer: Self-pay | Admitting: Cardiology

## 2020-01-06 ENCOUNTER — Other Ambulatory Visit: Payer: Self-pay | Admitting: Nephrology

## 2020-01-07 MED FILL — ONDANSETRON HCL 4 MG TABLET: 4 | 20 days supply | Qty: 60 | Fill #0

## 2020-01-11 ENCOUNTER — Telehealth: Payer: Self-pay

## 2020-01-11 NOTE — Telephone Encounter (Signed)
Received call from [facility]. Patient has absent thrill/bruit since 12/21/19. We did not receive the fax for a new appt. Patient is currently receiving dialysis through a catheter. Left VM for patient if he wants to move up his appt for dialysis duplex/poss new access.

## 2020-01-12 ENCOUNTER — Ambulatory Visit: Payer: Self-pay | Admitting: Family Medicine

## 2020-01-14 ENCOUNTER — Ambulatory Visit: Payer: Self-pay

## 2020-01-19 ENCOUNTER — Ambulatory Visit (INDEPENDENT_AMBULATORY_CARE_PROVIDER_SITE_OTHER): Payer: Medicaid Other | Admitting: Family Medicine

## 2020-01-19 ENCOUNTER — Other Ambulatory Visit: Payer: Self-pay | Admitting: Family Medicine

## 2020-01-19 ENCOUNTER — Other Ambulatory Visit: Payer: Self-pay

## 2020-01-19 VITALS — BP 132/82 | HR 106 | Temp 97.3°F | Resp 16 | Ht 73.0 in | Wt 252.0 lb

## 2020-01-19 DIAGNOSIS — M79601 Pain in right arm: Secondary | ICD-10-CM

## 2020-01-19 DIAGNOSIS — I10 Essential (primary) hypertension: Secondary | ICD-10-CM | POA: Diagnosis not present

## 2020-01-19 DIAGNOSIS — N186 End stage renal disease: Secondary | ICD-10-CM

## 2020-01-19 DIAGNOSIS — W101XXA Fall (on)(from) sidewalk curb, initial encounter: Secondary | ICD-10-CM

## 2020-01-19 DIAGNOSIS — I120 Hypertensive chronic kidney disease with stage 5 chronic kidney disease or end stage renal disease: Secondary | ICD-10-CM | POA: Diagnosis not present

## 2020-01-19 DIAGNOSIS — R42 Dizziness and giddiness: Secondary | ICD-10-CM

## 2020-01-19 MED ORDER — AMLODIPINE BESYLATE 5 MG PO TABS: 5.0000 mg | ORAL_TABLET | Freq: Every day | ORAL | 5 refills | Status: DC

## 2020-01-19 MED ORDER — ROSUVASTATIN CALCIUM 10 MG PO TABS: 10.0000 mg | ORAL_TABLET | Freq: Every day | ORAL | 5 refills | Status: DC

## 2020-01-19 MED ORDER — CARVEDILOL 12.5 MG PO TABS: 12.5000 mg | ORAL_TABLET | Freq: Two times a day (BID) | ORAL | 1 refills | Status: DC

## 2020-01-19 MED ORDER — ACETAMINOPHEN-CODEINE #3 300-30 MG PO TABS: 1.0000 | ORAL_TABLET | ORAL | 0 refills | Status: DC | PRN

## 2020-01-19 MED FILL — ROSUVASTATIN CALCIUM 10 MG: 10 | 30 days supply | Qty: 30 | Fill #0

## 2020-01-19 MED FILL — AMLODIPINE BESYLATE 5 MG TA: 5 | 30 days supply | Qty: 30 | Fill #0

## 2020-01-19 MED FILL — CARVEDILOL 12.5 MG TABLET: 12.5 | 30 days supply | Qty: 60 | Fill #0

## 2020-01-19 NOTE — Patient Instructions (Signed)
We have made the following changes to medication regimen: Amlodipine 5 mg daily, preferably at bedtime Decrease carvedilol to 12.5 mg every 12 hours.  Please take these medications with food. Take all other medications as previously prescribed.  Go to [facility] x-ray department for right arm x-ray.  Also, Tylenol 3 every 6 hours as needed for severe right arm pain. Apply ice, hourly 10 minutes, use interchangeably with heat.

## 2020-01-19 NOTE — Progress Notes (Signed)
Patient [facility]  Established Patient Office Visit  Subjective:  Patient ID: Michael [name], male    DOB: 03/27/1975  Age: 44 y.o. MRN: [id number]  CC: Essential hypertension, dizziness, end-stage renal disease.  HPI Michael [name] is a very unfortunate 44 year old male with a medical history significant for uncontrolled hypertension, end-stage chronic kidney disease, obstructive sleep apnea, polycystic kidney disease, vitamin D deficiency presents for follow-up of chronic conditions.  Patient is also complaining of a fall 5 days prior. Has been undergoing hemodialysis on Monday, Wednesday, and Friday.  Patient has noticed extreme dizziness following hemodialysis.  He said the vital signs have been checked, however antihypertensives have not been adjusted.  Patient says he has not been taking medications due to to the fact that he is afraid of becoming dizzy.  He says that on January 17, 2020 he "blacked out" and sustained a fall.  He says that he did not go to the emergency department for work-up.  He did not want to wait an extensive amount of hours.  He says that he has not had great experiences at the emergency department and refused to go despite his family's recommendations.  He did not sustain a head injury.  He is complaining about pain primarily to right elbow and right forearm.  Patient has not attempted any over-the-counter medications to assist with this problem.  He is having difficulty lifting heavy objects. He denies any headache, chest pain, urinary symptoms, nausea, vomiting, or diarrhea.  Patient says that he becomes dizzy when transitioning from sitting to standing.  Past Medical History:  Diagnosis Date  . CKD (chronic kidney disease)   . History of renal dialysis   . Hypertension   . OSA (obstructive sleep apnea)   . Polycystic kidney disease   . Vitamin D deficiency 11/2018    Past Surgical History:  Procedure Laterality Date  .  BASCILIC VEIN TRANSPOSITION Left 12/11/2019   Procedure: 1ST STAGE BASILIC TRANSPOSITION OF LEFT ARM;  Surgeon: Angelia Mould, MD;  Location: [facility];  Service: Vascular;  Laterality: Left;  . IR FLUORO GUIDE CV LINE RIGHT  12/09/2019  . IR US GUIDE VASC ACCESS RIGHT  12/09/2019  . LEFT HEART CATH AND CORONARY ANGIOGRAPHY N/A 12/09/2019   Procedure: LEFT HEART CATH AND CORONARY ANGIOGRAPHY;  Surgeon: Adrian Prows, MD;  Location: [facility];  Service: Cardiovascular;  Laterality: N/A;    Family History  Problem Relation Age of Onset  . Bipolar disorder Father     Social History   Socioeconomic History  . Marital status: Single    Spouse name: Not on file  . Number of children: Not on file  . Years of education: Not on file  . Highest education level: Not on file  Occupational History  . Not on file  Tobacco Use  . Smoking status: Former Research scientist (life sciences)  . Smokeless tobacco: Never Used  Vaping Use  . Vaping Use: Never used  Substance and Sexual Activity  . Alcohol use: Yes    Comment: wine occ   . Drug use: No  . Sexual activity: Yes  Other Topics Concern  . Not on file  Social History Narrative  . Not on file   Social Determinants of Health   Financial Resource Strain:   . Difficulty of Paying Living Expenses: Not on file  Food Insecurity:   . Worried About Charity fundraiser in the Last Year: Not on file  .  Ran Out of Food in the Last Year: Not on file  Transportation Needs:   . Lack of Transportation (Medical): Not on file  . Lack of Transportation (Non-Medical): Not on file  Physical Activity:   . Days of Exercise per Week: Not on file  . Minutes of Exercise per Session: Not on file  Stress:   . Feeling of Stress : Not on file  Social Connections:   . Frequency of Communication with Friends and Family: Not on file  . Frequency of Social Gatherings with Friends and Family: Not on file  . Attends Religious Services: Not on file  . Active Member of Clubs or  Organizations: Not on file  . Attends Archivist Meetings: Not on file  . Marital Status: Not on file  Intimate Partner Violence:   . Fear of Current or Ex-Partner: Not on file  . Emotionally Abused: Not on file  . Physically Abused: Not on file  . Sexually Abused: Not on file    Outpatient Medications Prior to Visit  Medication Sig Dispense Refill  . acetaminophen (TYLENOL) 650 MG CR tablet Take 650 mg by mouth daily.     Marland Kitchen aspirin EC 81 MG EC tablet Take 1 tablet (81 mg total) by mouth daily. Swallow whole. 30 tablet 11  . calcitRIOL (ROCALTROL) 0.25 MCG capsule Take 1 capsule (0.25 mcg total) by mouth daily. 30 capsule 0  . camphor-menthol (SARNA) lotion Apply topically as needed for itching. 222 mL 1  . isosorbide dinitrate (ISORDIL) 20 MG tablet Take 1 tablet (20 mg total) by mouth 3 (three) times daily. 90 tablet 1  . multivitamin (ONE-A-DAY MEN'S) TABS tablet Take 1 tablet by mouth daily. 30 tablet 11  . nitroGLYCERIN (NITROSTAT) 0.4 MG SL tablet Place 1 tablet (0.4 mg total) under the tongue every 5 (five) minutes as needed for chest pain. 100 tablet 0  . rosuvastatin (CRESTOR) 10 MG tablet Take 1 tablet (10 mg total) by mouth daily. 30 tablet 0  . sevelamer carbonate (RENVELA) 800 MG tablet Take 2 tablets (1,600 mg total) by mouth 3 (three) times daily with meals. 180 tablet 0  . amLODipine (NORVASC) 10 MG tablet Take 1 tablet (10 mg total) by mouth daily. 30 tablet 0  . carvedilol (COREG) 25 MG tablet Take 1 tablet (25 mg total) by mouth 2 (two) times daily with a meal. 60 tablet 0  . hydrALAZINE (APRESOLINE) 100 MG tablet Take 1 tablet (100 mg total) by mouth every 8 (eight) hours. 90 tablet 0   No facility-administered medications prior to visit.    No Known Allergies Review of Systems  Constitutional: Negative.   HENT: Negative.   Eyes: Negative.   Respiratory: Negative.  Negative for shortness of breath.   Cardiovascular: Negative for chest pain.    Gastrointestinal: Negative for nausea.  Genitourinary: Negative.   Musculoskeletal: Positive for joint pain.  Neurological: Positive for dizziness and weakness. Negative for tingling and headaches.  Endo/Heme/Allergies: Negative.   Psychiatric/Behavioral: Negative.       Objective:    Physical Exam Constitutional:      Appearance: He is obese.  Eyes:     Pupils: Pupils are equal, round, and reactive to light.  Cardiovascular:     Rate and Rhythm: Normal rate and regular rhythm.  Pulmonary:     Effort: Pulmonary effort is normal.  Abdominal:     General: Bowel sounds are normal.  Musculoskeletal:     Right upper arm: No swelling or  edema.     Right elbow: No swelling, deformity or effusion. Decreased range of motion.     Right forearm: Tenderness present. No swelling or edema.  Skin:    General: Skin is warm.  Neurological:     General: No focal deficit present.     Mental Status: He is alert and oriented to person, place, and time. Mental status is at baseline.     Sensory: No sensory deficit.     Motor: No weakness.     Gait: Gait normal.  Psychiatric:        Mood and Affect: Mood normal.        Thought Content: Thought content normal.        Judgment: Judgment normal.     BP 132/82   Pulse (!) 106   Temp (!) 97.3 F (36.3 C)   Resp 16   Ht 6\' 1"  (1.854 m)   Wt 252 lb (114.3 kg)   SpO2 98%   BMI 33.25 kg/m  Wt Readings from Last 3 Encounters:  01/19/20 252 lb (114.3 kg)  12/17/19 251 lb (113.9 kg)  12/14/19 251 lb 15.8 oz (114.3 kg)     Health Maintenance Due  Topic Date Due  . COVID-19 Vaccine (1) Never done    There are no preventive care reminders to display for this patient.  Lab Results  Component Value Date   TSH 2.730 12/12/2018   Lab Results  Component Value Date   WBC 9.7 12/14/2019   HGB 9.2 (L) 12/14/2019   HCT 29.7 (L) 12/14/2019   MCV 87.9 12/14/2019   PLT 266 12/14/2019   Lab Results  Component Value Date   NA 134 (L)  12/14/2019   K 4.6 12/14/2019   CO2 23 12/14/2019   GLUCOSE 91 12/14/2019   BUN 65 (H) 12/14/2019   CREATININE 8.28 (H) 12/14/2019   BILITOT 0.4 12/08/2019   ALKPHOS 36 (L) 12/08/2019   AST 13 (L) 12/08/2019   ALT 12 12/08/2019   PROT 7.4 12/08/2019   ALBUMIN 3.7 12/14/2019   CALCIUM 9.0 12/14/2019   ANIONGAP 12 12/14/2019   Lab Results  Component Value Date   CHOL 173 12/02/2019   Lab Results  Component Value Date   HDL 25 (L) 12/02/2019   Lab Results  Component Value Date   LDLCALC 95 12/02/2019   Lab Results  Component Value Date   TRIG 267 (H) 12/02/2019   Lab Results  Component Value Date   CHOLHDL 6.9 12/02/2019   Lab Results  Component Value Date   HGBA1C 5.6 11/24/2019   HGBA1C 5.6 11/24/2019   HGBA1C 5.6 (A) 11/24/2019   HGBA1C 5.6 11/24/2019      Assessment & Plan:   Problem List Items Addressed This Visit      Cardiovascular and Mediastinum   Essential hypertension, benign - Primary   Relevant Medications   carvedilol (COREG) 12.5 MG tablet   amLODipine (NORVASC) 5 MG tablet    Other Visit Diagnoses    End stage renal disease due to hypertension (HCC)       Arm pain, lateral, right       Relevant Medications   acetaminophen-codeine (TYLENOL #3) 300-30 MG tablet   Other Relevant Orders   DG Forearm Right   Fall (on)(from) sidewalk curb, initial encounter       Relevant Orders   DG Forearm Right   Light-headed feeling          Meds ordered this encounter  Medications  . carvedilol (COREG) 12.5 MG tablet    Sig: Take 1 tablet (12.5 mg total) by mouth 2 (two) times daily with a meal.    Dispense:  60 tablet    Refill:  1    Order Specific Question:   Supervising Provider    Answer:   Tresa Garter [id number]  . amLODipine (NORVASC) 5 MG tablet    Sig: Take 1 tablet (5 mg total) by mouth daily.    Dispense:  30 tablet    Refill:  5    Order Specific Question:   Supervising Provider    Answer:   Tresa Garter [id number]   . acetaminophen-codeine (TYLENOL #3) 300-30 MG tablet    Sig: Take 1 tablet by mouth every 4 (four) hours as needed for moderate pain.    Dispense:  20 tablet    Refill:  0    Order Specific Question:   Supervising Provider    Answer:   Angelica Chessman E [[id number]]   6. Essential hypertension, benign BP 132/82   Pulse (!) 106   Temp (!) 97.3 F (36.3 C)   Resp 16   Ht 6\' 1"  (1.854 m)   Wt 252 lb (114.3 kg)   SpO2 98%   BMI 33.25 kg/m  - Continue medication, monitor blood pressure at home. Continue DASH diet. Reminder to go to the ER if any CP, SOB, nausea, dizziness, severe HA, changes vision/speech, left arm numbness and tingling and jaw pain.    - CBC - Comprehensive metabolic panel  2. End stage renal disease due to hypertension [facility]) Continue hemodialysis as scheduled.  Also, schedule follow up with nephrology - Comprehensive metabolic panel  3. Arm pain, lateral, right Reviewed PDMP substance reporting system prior to prescribing opiate medications. No inconsistencies noted.    - acetaminophen-codeine (TYLENOL #3) 300-30 MG tablet; Take 1 tablet by mouth every 4 (four) hours as needed for moderate pain.  Dispense: 20 tablet; Refill: 0 - DG Forearm Right; Future  4. Fall (on)(from) sidewalk curb, initial encounter - DG Forearm Right; Future  5. Light-headed feeling Decreased dosage of Amlodipine and Carvedilol. Discussed medication management at length. Patient will return to clinic in 2 weeks for medication management.     Follow-up: Return in about 2 weeks (around 02/02/2020).    Michael Pounds  APRN, MSN, FNP-C Patient [facility] [address] [place], [place] [address] [phone] \

## 2020-01-20 ENCOUNTER — Other Ambulatory Visit: Payer: Self-pay | Admitting: Family Medicine

## 2020-01-20 ENCOUNTER — Ambulatory Visit
Admission: RE | Admit: 2020-01-20 | Discharge: 2020-01-20 | Disposition: A | Discharge status: Home / Self Care | Payer: Self-pay | Source: Ambulatory Visit | Attending: Family Medicine | Admitting: Family Medicine

## 2020-01-20 ENCOUNTER — Telehealth: Payer: Self-pay | Admitting: Family Medicine

## 2020-01-20 DIAGNOSIS — M79601 Pain in right arm: Secondary | ICD-10-CM

## 2020-01-20 DIAGNOSIS — W101XXA Fall (on)(from) sidewalk curb, initial encounter: Secondary | ICD-10-CM

## 2020-01-20 LAB — COMPREHENSIVE METABOLIC PANEL
ALT: 22 IU/L (ref 0–44)
AST: 14 IU/L (ref 0–40)
Albumin/Globulin Ratio: 1.5 (ref 1.2–2.2)
Albumin: 5.2 g/dL — ABNORMAL HIGH (ref 4.0–5.0)
Alkaline Phosphatase: 67 IU/L (ref 44–121)
BUN/Creatinine Ratio: 5 — ABNORMAL LOW (ref 9–20)
BUN: 44 mg/dL — ABNORMAL HIGH (ref 6–24)
Bilirubin Total: 0.2 mg/dL (ref 0.0–1.2)
CO2: 20 mmol/L (ref 20–29)
Calcium: 8.6 mg/dL — ABNORMAL LOW (ref 8.7–10.2)
Chloride: 94 mmol/L — ABNORMAL LOW (ref 96–106)
Creatinine, Ser: 8.97 mg/dL — ABNORMAL HIGH (ref 0.76–1.27)
GFR calc Af Amer: 7 mL/min/{1.73_m2} — ABNORMAL LOW (ref >59)
GFR calc non Af Amer: 6 mL/min/{1.73_m2} — ABNORMAL LOW (ref >59)
Globulin, Total: 3.5 g/dL (ref 1.5–4.5)
Glucose: 88 mg/dL (ref 65–99)
Potassium: 5.4 mmol/L — ABNORMAL HIGH (ref 3.5–5.2)
Sodium: 139 mmol/L (ref 134–144)
Total Protein: 8.7 g/dL — ABNORMAL HIGH (ref 6.0–8.5)

## 2020-01-20 LAB — CBC
Hematocrit: 37.9 % (ref 37.5–51.0)
Hemoglobin: 12.3 g/dL — ABNORMAL LOW (ref 13.0–17.7)
MCH: 28.5 pg (ref 26.6–33.0)
MCHC: 32.5 g/dL (ref 31.5–35.7)
MCV: 88 fL (ref 79–97)
Platelets: 295 10*3/uL (ref 150–450)
RBC: 4.31 x10E6/uL (ref 4.14–5.80)
RDW: 15.8 % — ABNORMAL HIGH (ref 11.6–15.4)
WBC: 13.7 10*3/uL — ABNORMAL HIGH (ref 3.4–10.8)

## 2020-01-20 NOTE — Progress Notes (Signed)
Orders Placed This Encounter  Procedures  . DG ELBOW COMPLETE RIGHT (3+VIEW)    Standing Status:   Future    Standing Expiration Date:   01/19/2021    Order Specific Question:   Reason for Exam (SYMPTOM  OR DIAGNOSIS REQUIRED)    Answer:   right arm pain/injury    Order Specific Question:   Preferred imaging location?    Answer:   [facility]    [name]  APRN, MSN, FNP-C Patient [facility] [address] [place], [place] [address] [phone]

## 2020-01-20 NOTE — Telephone Encounter (Signed)
Patient notified of results, verbally understood. Stated he has not had anymore episodes. No additional questions.

## 2020-01-20 NOTE — Progress Notes (Signed)
Orders Placed This Encounter  Procedures  . DG Humerus Right    Standing Status:   Future    Standing Expiration Date:   01/19/2021    Order Specific Question:   Reason for Exam (SYMPTOM  OR DIAGNOSIS REQUIRED)    Answer:   Right arm pain/injury    Order Specific Question:   Preferred imaging location?    Answer:   [facility]     [name]  APRN, MSN, FNP-C Patient [facility] [address] [place], [place] [address] [phone]

## 2020-01-25 ENCOUNTER — Telehealth: Payer: Self-pay | Admitting: Family Medicine

## 2020-01-25 NOTE — Telephone Encounter (Signed)
  Patient [name]   Michael [name] is a 44 year old male with a medical history significant for uncontrolled hypertension, and end-stage renal disease presented on 01/19/2020 with complaints of right elbow and right forearm pain after a fall that took place on 01/16/2020.  Patient was not evaluated in the emergency department or urgent care for this problem.  Patient underwent x-rays of elbow and forearm, elbow x-ray shows some mild edema.  All other images were unremarkable.  Discussed with patient at length.  Patient given opportunity ask questions.  He expressed understanding of all information.  Patient advised to follow-up as scheduled.   [name]  APRN, MSN, FNP-C Patient [facility] [address] [place], [place] [address] [phone]

## 2020-01-26 ENCOUNTER — Other Ambulatory Visit: Payer: Self-pay

## 2020-01-26 DIAGNOSIS — N179 Acute kidney failure, unspecified: Secondary | ICD-10-CM

## 2020-02-01 ENCOUNTER — Encounter: Payer: Self-pay | Admitting: Family Medicine

## 2020-02-02 ENCOUNTER — Encounter: Payer: Self-pay | Admitting: Family Medicine

## 2020-02-02 ENCOUNTER — Ambulatory Visit (INDEPENDENT_AMBULATORY_CARE_PROVIDER_SITE_OTHER): Payer: Medicaid Other | Admitting: Family Medicine

## 2020-02-02 ENCOUNTER — Emergency Department (HOSPITAL_COMMUNITY): Payer: Medicaid Other

## 2020-02-02 ENCOUNTER — Emergency Department (HOSPITAL_COMMUNITY)
Admission: EM | Admit: 2020-02-02 | Discharge: 2020-02-02 | Disposition: A | Discharge status: Home / Self Care | Payer: Medicaid Other | Attending: Emergency Medicine | Admitting: Emergency Medicine

## 2020-02-02 ENCOUNTER — Encounter (HOSPITAL_COMMUNITY): Payer: Self-pay

## 2020-02-02 ENCOUNTER — Other Ambulatory Visit: Payer: Self-pay

## 2020-02-02 VITALS — BP 95/51 | HR 62 | Temp 97.0°F | Resp 14

## 2020-02-02 DIAGNOSIS — N186 End stage renal disease: Secondary | ICD-10-CM | POA: Diagnosis not present

## 2020-02-02 DIAGNOSIS — Z7982 Long term (current) use of aspirin: Secondary | ICD-10-CM | POA: Diagnosis not present

## 2020-02-02 DIAGNOSIS — Z79899 Other long term (current) drug therapy: Secondary | ICD-10-CM | POA: Diagnosis not present

## 2020-02-02 DIAGNOSIS — R55 Syncope and collapse: Secondary | ICD-10-CM

## 2020-02-02 DIAGNOSIS — N189 Chronic kidney disease, unspecified: Secondary | ICD-10-CM | POA: Diagnosis not present

## 2020-02-02 DIAGNOSIS — I10 Essential (primary) hypertension: Secondary | ICD-10-CM | POA: Diagnosis not present

## 2020-02-02 DIAGNOSIS — Z87891 Personal history of nicotine dependence: Secondary | ICD-10-CM | POA: Diagnosis not present

## 2020-02-02 DIAGNOSIS — I120 Hypertensive chronic kidney disease with stage 5 chronic kidney disease or end stage renal disease: Secondary | ICD-10-CM | POA: Diagnosis not present

## 2020-02-02 DIAGNOSIS — R569 Unspecified convulsions: Secondary | ICD-10-CM | POA: Diagnosis not present

## 2020-02-02 DIAGNOSIS — I129 Hypertensive chronic kidney disease with stage 1 through stage 4 chronic kidney disease, or unspecified chronic kidney disease: Secondary | ICD-10-CM | POA: Insufficient documentation

## 2020-02-02 LAB — BASIC METABOLIC PANEL
Anion gap: 17 — ABNORMAL HIGH (ref 5–15)
BUN: 60 mg/dL — ABNORMAL HIGH (ref 6–20)
CO2: 22 mmol/L (ref 22–32)
Calcium: 7.7 mg/dL — ABNORMAL LOW (ref 8.9–10.3)
Chloride: 97 mmol/L — ABNORMAL LOW (ref 98–111)
Creatinine, Ser: 10.51 mg/dL — ABNORMAL HIGH (ref 0.61–1.24)
GFR, Estimated: 6 mL/min — ABNORMAL LOW (ref >60)
Glucose, Bld: 115 mg/dL — ABNORMAL HIGH (ref 70–99)
Potassium: 5.3 mmol/L — ABNORMAL HIGH (ref 3.5–5.1)
Sodium: 136 mmol/L (ref 135–145)

## 2020-02-02 LAB — CBC WITH DIFFERENTIAL/PLATELET
Abs Immature Granulocytes: 0.03 10*3/uL (ref 0.00–0.07)
Basophils Absolute: 0.1 10*3/uL (ref 0.0–0.1)
Basophils Relative: 1 %
Eosinophils Absolute: 0.2 10*3/uL (ref 0.0–0.5)
Eosinophils Relative: 2 %
HCT: 38 % — ABNORMAL LOW (ref 39.0–52.0)
Hemoglobin: 11.9 g/dL — ABNORMAL LOW (ref 13.0–17.0)
Immature Granulocytes: 0 %
Lymphocytes Relative: 24 %
Lymphs Abs: 2.6 10*3/uL (ref 0.7–4.0)
MCH: 28.5 pg (ref 26.0–34.0)
MCHC: 31.3 g/dL (ref 30.0–36.0)
MCV: 91.1 fL (ref 80.0–100.0)
Monocytes Absolute: 0.7 10*3/uL (ref 0.1–1.0)
Monocytes Relative: 7 %
Neutro Abs: 7.5 10*3/uL (ref 1.7–7.7)
Neutrophils Relative %: 66 %
Platelets: 295 10*3/uL (ref 150–400)
RBC: 4.17 MIL/uL — ABNORMAL LOW (ref 4.22–5.81)
RDW: 15.2 % (ref 11.5–15.5)
WBC: 11.2 10*3/uL — ABNORMAL HIGH (ref 4.0–10.5)
nRBC: 0 % (ref 0.0–0.2)

## 2020-02-02 LAB — MAGNESIUM: Magnesium: 2.3 mg/dL (ref 1.7–2.4)

## 2020-02-02 MED ORDER — SODIUM CHLORIDE 0.9 % IV BOLUS
1000.0000 mL | Freq: Once | INTRAVENOUS | Status: AC
  Administered 2020-02-02: 1000 mL via INTRAVENOUS

## 2020-02-02 MED ORDER — SODIUM CHLORIDE 0.9 % IV BOLUS
500.0000 mL | Freq: Once | INTRAVENOUS | Status: AC
  Administered 2020-02-02: 500 mL via INTRAVENOUS

## 2020-02-02 NOTE — ED Notes (Signed)
MD at bedside. 

## 2020-02-02 NOTE — ED Notes (Signed)
Patient ambulated to RR w/o assistance.

## 2020-02-02 NOTE — ED Notes (Signed)
During orthostatic vitals patient could only stand for 1.5 out of the 3 minutes. Patiens right shoulder became weak and started to become faint.

## 2020-02-02 NOTE — ED Notes (Signed)
Pt discharged from this ED in stable condition at this time. All discharge instructions and follow up care reviewed with pt with no further questions at this time. Pt ambulatory with steady gait, clear speech. Denies any dizziness at this time.

## 2020-02-02 NOTE — ED Provider Notes (Signed)
[facility] DEPT Provider Note   CSN: [id number] Arrival date & time: 02/02/20  1941     History Chief Complaint  Patient presents with  . Loss of Consciousness    Michael [name] is a 44 y.o. male.  HPI 44 year old male presents from [place] office with possible seizure.  The patient was in the waiting room and passed out and had shaking witnessed by other patients.  Patient tells me that for the last several weeks he has been feeling lightheaded after he takes his morning medicines and often feels nauseated or vomits.  He has been getting dialysis on Monday, Wednesday, and Friday.  Typically the day of and day after dialysis he does not feel well.  The lightheadedness is nearly every day.  Today he was feeling progressively more lightheaded and sweaty and achy.  He then remembers waking up in the doctor's office.  No headache. No chest pain or dyspnea.  Did not bite his tongue.  Past Medical History:  Diagnosis Date  . CKD (chronic kidney disease)   . History of renal dialysis   . Hypertension   . OSA (obstructive sleep apnea)   . Polycystic kidney disease   . Vitamin D deficiency 11/2018    Patient Active Problem List   Diagnosis Date Noted  . AKI (acute kidney injury) ([place]) 12/07/2019  . Uremia 12/02/2019  . Chest pain 12/02/2019  . Elevated troponin 12/02/2019  . Anemia in CKD (chronic kidney disease) 12/02/2019  . Adjustment disorder with mixed anxiety and depressed mood 11/28/2019  . Renal failure 11/25/2019  . Hyperkalemia 11/25/2019  . Normocytic anemia 11/25/2019  . Metabolic acidosis, increased anion gap   . Hematochezia   . Chronic gout due to renal impairment without tophus 12/14/2018  . Polycystic kidney disease 11/09/2016  . Observed sleep apnea 09/01/2016  . Excessive daytime sleepiness 09/01/2016  . Primary snoring 09/01/2016  . Acute pyelonephritis 05/25/2016  . Acute gouty arthritis 05/25/2016  . Hematuria 05/25/2016  .  Essential hypertension, benign 05/25/2016  . Morbid obesity ([place]) 05/25/2016  . Hypertensive crisis 05/23/2016  . Adult polycystic kidney disease 05/23/2016  . SIRS (systemic inflammatory response syndrome) ([place]) 05/23/2016    Past Surgical History:  Procedure Laterality Date  . BASCILIC VEIN TRANSPOSITION Left 12/11/2019   Procedure: 1ST STAGE BASILIC TRANSPOSITION OF LEFT ARM;  Surgeon: Angelia Mould, MD;  Location: [facility];  Service: Vascular;  Laterality: Left;  . IR FLUORO GUIDE CV LINE RIGHT  12/09/2019  . IR US GUIDE VASC ACCESS RIGHT  12/09/2019  . LEFT HEART CATH AND CORONARY ANGIOGRAPHY N/A 12/09/2019   Procedure: LEFT HEART CATH AND CORONARY ANGIOGRAPHY;  Surgeon: Adrian Prows, MD;  Location: [facility];  Service: Cardiovascular;  Laterality: N/A;       Family History  Problem Relation Age of Onset  . Bipolar disorder Father     Social History   Tobacco Use  . Smoking status: Former Research scientist (life sciences)  . Smokeless tobacco: Never Used  Vaping Use  . Vaping Use: Never used  Substance Use Topics  . Alcohol use: Yes    Comment: wine occ   . Drug use: No    Home Medications Prior to Admission medications   Medication Sig Start Date End Date Taking? Authorizing Provider  acetaminophen (TYLENOL) 325 MG tablet Take 650 mg by mouth daily. 12/15/19   [provider]  acetaminophen (TYLENOL) 650 MG CR tablet Take 650 mg by mouth daily.  [provider]  acetaminophen-codeine (TYLENOL #3) 300-30 MG tablet Take 1 tablet by mouth every 4 (four) hours as needed for moderate pain. 01/19/20   [name], FNP  aspirin EC 81 MG EC tablet Take 1 tablet (81 mg total) by mouth daily. Swallow whole. 12/15/19   Little Ishikawa, MD  calcitRIOL (ROCALTROL) 0.25 MCG capsule Take 1 capsule (0.25 mcg total) by mouth daily. 11/28/19   Geradine Girt, DO  camphor-menthol [facility]) lotion Apply topically as needed for itching. 12/02/19   Samuella Cota, MD    carvedilol (COREG) 12.5 MG tablet Take 1 tablet (12.5 mg total) by mouth 2 (two) times daily with a meal. 01/19/20   [name], FNP  isosorbide dinitrate (ISORDIL) 20 MG tablet Take 1 tablet (20 mg total) by mouth 3 (three) times daily. 12/02/19   Samuella Cota, MD  multivitamin (ONE-A-DAY MEN'S) TABS tablet Take 1 tablet by mouth daily. 03/21/18   Lanae Boast, FNP  nitroGLYCERIN (NITROSTAT) 0.4 MG SL tablet Place 1 tablet (0.4 mg total) under the tongue every 5 (five) minutes as needed for chest pain. 12/02/19 12/01/20  Samuella Cota, MD  ondansetron (ZOFRAN) 4 MG tablet Take 4 mg by mouth 3 (three) times daily as needed for nausea/vomiting. 01/07/20   [provider]  oxyCODONE (OXY IR/ROXICODONE) 5 MG immediate release tablet Take 5 mg by mouth 3 (three) times daily. 01/20/20   [provider]  rosuvastatin (CRESTOR) 10 MG tablet Take 1 tablet (10 mg total) by mouth daily. 01/19/20 01/18/21  [name], FNP  sevelamer carbonate (RENVELA) 800 MG tablet Take 2 tablets (1,600 mg total) by mouth 3 (three) times daily with meals. 12/14/19   Little Ishikawa, MD  amLODipine (NORVASC) 5 MG tablet Take 1 tablet (5 mg total) by mouth daily. 01/19/20 02/02/20  [name], FNP    Allergies    Patient has no known allergies.  Review of Systems   Review of Systems  Constitutional: Positive for diaphoresis. Negative for fever.  Respiratory: Negative for shortness of breath.   Cardiovascular: Negative for chest pain.  Gastrointestinal: Positive for constipation and vomiting. Negative for abdominal pain and diarrhea.  Neurological: Positive for syncope and light-headedness. Negative for headaches.  All other systems reviewed and are negative.   Physical Exam Updated Vital Signs BP 108/63   Pulse 76   Temp 98.8 F (37.1 C) (Oral)   Resp 13   SpO2 100%   Physical Exam Vitals and nursing note reviewed.  Constitutional:      General: He is not in  acute distress.    Appearance: He is well-developed. He is obese. He is not ill-appearing or diaphoretic.  HENT:     Head: Normocephalic and atraumatic.     Right Ear: External ear normal.     Left Ear: External ear normal.     Nose: Nose normal.     Mouth/Throat:     Comments: No obvious tongue injury Eyes:     General:        Right eye: No discharge.        Left eye: No discharge.     Extraocular Movements: Extraocular movements intact.     Pupils: Pupils are equal, round, and reactive to light.  Cardiovascular:     Rate and Rhythm: Normal rate and regular rhythm.     Heart sounds: Normal heart sounds. No murmur heard.   Pulmonary:     Effort: Pulmonary effort  is normal.     Breath sounds: Normal breath sounds.  Abdominal:     Palpations: Abdomen is soft.     Tenderness: There is no abdominal tenderness.  Musculoskeletal:     Cervical back: Neck supple.  Skin:    General: Skin is warm and dry.  Neurological:     Mental Status: He is alert.     Comments: CN 3-12 grossly intact. 5/5 strength in all 4 extremities. Grossly normal sensation. Normal finger to nose.   Psychiatric:        Mood and Affect: Mood is not anxious.     ED Results / Procedures / Treatments   Labs (all labs ordered are listed, but only abnormal results are displayed) Labs Reviewed  BASIC METABOLIC PANEL - Abnormal; Notable for the following components:      Result Value   Potassium 5.3 (*)    Chloride 97 (*)    Glucose, Bld 115 (*)    BUN 60 (*)    Creatinine, Ser 10.51 (*)    Calcium 7.7 (*)    GFR, Estimated 6 (*)    Anion gap 17 (*)    All other components within normal limits  CBC WITH DIFFERENTIAL/PLATELET - Abnormal; Notable for the following components:   WBC 11.2 (*)    RBC 4.17 (*)    Hemoglobin 11.9 (*)    HCT 38.0 (*)    All other components within normal limits  MAGNESIUM    EKG EKG Interpretation  Date/Time:  Tuesday February 02 2020 10:16:51 EST Ventricular Rate:   79 PR Interval:    QRS Duration: 83 QT Interval:  363 QTC Calculation: 417 R Axis:     Text Interpretation: EASI Derived Leads T wave changes similar to Sept 2021 Confirmed by Sherwood Gambler [phone]) on 02/02/2020 11:13:27 AM   Radiology DG Chest 2 View  Result Date: 02/02/2020 CLINICAL DATA:  Syncope EXAM: CHEST - 2 VIEW COMPARISON:  12/07/2019 FINDINGS: Right internal jugular approach hemodialysis catheter terminates at the level of the right atrium. The heart size and mediastinal contours are stable. Low lung volumes. No focal airspace consolidation, pleural effusion, or pneumothorax. The visualized skeletal structures are unremarkable. IMPRESSION: Low lung volumes without acute cardiopulmonary process. Electronically Signed   By: Davina Poke D.O.   On: 02/02/2020 10:58   CT HEAD WO CONTRAST  Result Date: 02/02/2020 CLINICAL DATA:  Seizure, nontraumatic. Additional history provided: Syncopal episode, elevated blood pressure, history of CKD. EXAM: CT HEAD WITHOUT CONTRAST TECHNIQUE: Contiguous axial images were obtained from the base of the skull through the vertex without intravenous contrast. COMPARISON:  Head CT 12/07/2019. FINDINGS: Brain: Mild symmetric frontal lobe atrophy, unchanged. There is no acute intracranial hemorrhage. No demarcated cortical infarct. No extra-axial fluid collection. No evidence of intracranial mass. No midline shift. Vascular: No hyperdense vessel. Skull: Normal. Negative for fracture or focal lesion. Sinuses/Orbits: Visualized orbits show no acute finding. Chronic deformity of the right lamina papyracea. No significant paranasal sinus disease at the imaged levels. IMPRESSION: No evidence of acute intracranial abnormality. Stable mild symmetric frontal lobe atrophy. Electronically Signed   By: Kellie Simmering DO   On: 02/02/2020 11:49    Procedures Procedures (including critical care time)  Medications Ordered in ED Medications  sodium chloride 0.9 %  bolus 500 mL (0 mLs Intravenous Stopped 02/02/20 1232)  sodium chloride 0.9 % bolus 1,000 mL (1,000 mLs Intravenous New Bag/Given 02/02/20 1306)    ED Course  I have reviewed the triage  vital signs and the nursing notes.  Pertinent labs & imaging results that were available during my care of the patient were reviewed by me and considered in my medical decision making (see chart for details).    MDM Rules/Calculators/A&P                          Patient presents with syncope versus seizure.  While there was report from patient's in the waiting room that he had a seizure at the doctor's office, I suspect this was syncope with some shaking as he has been having recurrent lightheadedness for weeks.  He is showing recurrent symptoms when his blood pressure dropped in the emergency department and when he sat up.  He was given a 500 cc IV fluid bolus but then had recurrent symptoms and his blood pressure dropped again.  He was given a full liter and now his blood pressure is better and he is able to ambulate to the bathroom and back without difficulty.  No chest pain or shortness of breath.  Recent cardiac cath and echo reviewed and show no evidence of CHF or CAD.  My suspicion that this is a cardiac arrhythmia is very low.  I think a lot of this has to do with the multiple blood pressure meds he is on and he is getting dialysis and already has low blood pressure currently.  I discussed with his PCP, [place] Hollis, who asked to stop his Norvasc and she will see him in 2 days.  He needs to go to dialysis tomorrow.  We discussed return precautions. Final Clinical Impression(s) / ED Diagnoses Final diagnoses:  Syncope and collapse    Rx / DC Orders ED Discharge Orders    None       Sherwood Gambler, MD 02/02/20 1447

## 2020-02-02 NOTE — ED Notes (Signed)
Pt ambulated back to room from bathroom without assistance, steady gait. MD at bedside.

## 2020-02-02 NOTE — ED Triage Notes (Signed)
Patient BIB [facility] from primary care office c/o syncope episode in waiting room witnessed by other patients. Patient was just there for a wellness visit.  Patient had a soft pressure and sweaty.  Patient is a dialysis patient and had a treatment yesterday.  Patient states he has been having episodes like this after having his treatments.    Vitals were  110/70 86 22 98% RA  129 CBG

## 2020-02-02 NOTE — Discharge Instructions (Signed)
STOP taking NORVASC/AMLODIPINE.  Your primary care provider will call you for an appointment in 2 days.  If you feel recurrent dizziness or lightheadedness or like you are going to pass out, return to your doctor or come to this ER or preferably [facility] or call 911.  Be sure to go to dialysis tomorrow.

## 2020-02-02 NOTE — Progress Notes (Signed)
Patient [facility]    Subjective:  Patient ID: Michael [name], male    DOB: 02/08/1976  Age: 44 y.o. MRN: [id number]    HPI Michael [name] is a 44 year old male with a medical history significant for hypertension and end stage renal disease. Patient was in lobby awaiting 2 week follow up appointment for hypertension.  Patient experienced seizure that was witnessed by another patient.  She states that patient sees for greater than 1 minute. On arrival, patient was alert and oriented.  Blood pressure soft at 95/51.  Patient states that he does not remember anything prior to seizing.  He states that he drove himself to appointment and was sweating profusely on the way over.  Patient says that he has been experiencing the symptoms daily after taking amlodipine and carvedilol.  Patient was evaluated in this office 2 weeks ago and amlodipine was decreased from 10 mg daily to 5 mg and carvedilol was decreased from 25 mg twice daily to 12.5 mg twice daily.  Patient is undergoing hemodialysis 3 times per week Monday, Wednesday, and Friday.  Staff is calling 911.  Patient will be transported to [facility] for further work-up and evaluation of seizure activity.   Past Medical History:  Diagnosis Date  . CKD (chronic kidney disease)   . History of renal dialysis   . Hypertension   . OSA (obstructive sleep apnea)   . Polycystic kidney disease   . Vitamin D deficiency 11/2018    Past Surgical History:  Procedure Laterality Date  . BASCILIC VEIN TRANSPOSITION Left 12/11/2019   Procedure: 1ST STAGE BASILIC TRANSPOSITION OF LEFT ARM;  Surgeon: Angelia Mould, MD;  Location: [facility];  Service: Vascular;  Laterality: Left;  . IR FLUORO GUIDE CV LINE RIGHT  12/09/2019  . IR US GUIDE VASC ACCESS RIGHT  12/09/2019  . LEFT HEART CATH AND CORONARY ANGIOGRAPHY N/A 12/09/2019   Procedure: LEFT HEART CATH AND CORONARY ANGIOGRAPHY;  Surgeon: Adrian Prows, MD;   Location: [facility];  Service: Cardiovascular;  Laterality: N/A;    Family History  Problem Relation Age of Onset  . Bipolar disorder Father     Social History   Socioeconomic History  . Marital status: Single    Spouse name: Not on file  . Number of children: Not on file  . Years of education: Not on file  . Highest education level: Not on file  Occupational History  . Not on file  Tobacco Use  . Smoking status: Former Research scientist (life sciences)  . Smokeless tobacco: Never Used  Vaping Use  . Vaping Use: Never used  Substance and Sexual Activity  . Alcohol use: Yes    Comment: wine occ   . Drug use: No  . Sexual activity: Yes  Other Topics Concern  . Not on file  Social History Narrative  . Not on file   Social Determinants of Health   Financial Resource Strain:   . Difficulty of Paying Living Expenses: Not on file  Food Insecurity:   . Worried About Charity fundraiser in the Last Year: Not on file  . Ran Out of Food in the Last Year: Not on file  Transportation Needs:   . Lack of Transportation (Medical): Not on file  . Lack of Transportation (Non-Medical): Not on file  Physical Activity:   . Days of Exercise per Week: Not on file  . Minutes of Exercise per Session: Not on  file  Stress:   . Feeling of Stress : Not on file  Social Connections:   . Frequency of Communication with Friends and Family: Not on file  . Frequency of Social Gatherings with Friends and Family: Not on file  . Attends Religious Services: Not on file  . Active Member of Clubs or Organizations: Not on file  . Attends Archivist Meetings: Not on file  . Marital Status: Not on file  Intimate Partner Violence:   . Fear of Current or Ex-Partner: Not on file  . Emotionally Abused: Not on file  . Physically Abused: Not on file  . Sexually Abused: Not on file    Outpatient Medications Prior to Visit  Medication Sig Dispense Refill  . acetaminophen (TYLENOL) 650 MG CR tablet Take 650 mg by  mouth daily.     Marland Kitchen acetaminophen-codeine (TYLENOL #3) 300-30 MG tablet Take 1 tablet by mouth every 4 (four) hours as needed for moderate pain. 20 tablet 0  . amLODipine (NORVASC) 5 MG tablet Take 1 tablet (5 mg total) by mouth daily. 30 tablet 5  . aspirin EC 81 MG EC tablet Take 1 tablet (81 mg total) by mouth daily. Swallow whole. 30 tablet 11  . calcitRIOL (ROCALTROL) 0.25 MCG capsule Take 1 capsule (0.25 mcg total) by mouth daily. 30 capsule 0  . camphor-menthol (SARNA) lotion Apply topically as needed for itching. 222 mL 1  . carvedilol (COREG) 12.5 MG tablet Take 1 tablet (12.5 mg total) by mouth 2 (two) times daily with a meal. 60 tablet 1  . isosorbide dinitrate (ISORDIL) 20 MG tablet Take 1 tablet (20 mg total) by mouth 3 (three) times daily. 90 tablet 1  . multivitamin (ONE-A-DAY MEN'S) TABS tablet Take 1 tablet by mouth daily. 30 tablet 11  . nitroGLYCERIN (NITROSTAT) 0.4 MG SL tablet Place 1 tablet (0.4 mg total) under the tongue every 5 (five) minutes as needed for chest pain. 100 tablet 0  . rosuvastatin (CRESTOR) 10 MG tablet Take 1 tablet (10 mg total) by mouth daily. 30 tablet 5  . sevelamer carbonate (RENVELA) 800 MG tablet Take 2 tablets (1,600 mg total) by mouth 3 (three) times daily with meals. 180 tablet 0   No facility-administered medications prior to visit.    No Known Allergies  ROS Review of Systems  Constitutional: Positive for diaphoresis. Negative for activity change and appetite change.  HENT: Negative.   Cardiovascular: Negative for chest pain and leg swelling.  Gastrointestinal: Negative.   Endocrine: Negative.   Genitourinary: Negative.   Musculoskeletal: Negative.   Skin: Negative.   Neurological: Positive for seizures (Patient has no history of seizure.).  Hematological: Negative.   Psychiatric/Behavioral: Negative.       Objective:    Physical Exam Constitutional:      Appearance: He is obese. He is diaphoretic.  Cardiovascular:     Rate  and Rhythm: Bradycardia present.  Pulmonary:     Effort: Pulmonary effort is normal.  Neurological:     General: No focal deficit present.     Mental Status: He is oriented to person, place, and time. Mental status is at baseline.  Psychiatric:        Mood and Affect: Mood normal.        Thought Content: Thought content normal.        Judgment: Judgment normal.     There were no vitals taken for this visit. Wt Readings from Last 3 Encounters:  01/19/20 252 lb (114.3  kg)  12/17/19 251 lb (113.9 kg)  12/14/19 251 lb 15.8 oz (114.3 kg)     Health Maintenance Due  Topic Date Due  . COVID-19 Vaccine (1) Never done    There are no preventive care reminders to display for this patient.  Lab Results  Component Value Date   TSH 2.730 12/12/2018   Lab Results  Component Value Date   WBC 13.7 (H) 01/19/2020   HGB 12.3 (L) 01/19/2020   HCT 37.9 01/19/2020   MCV 88 01/19/2020   PLT 295 01/19/2020   Lab Results  Component Value Date   NA 139 01/19/2020   K 5.4 (H) 01/19/2020   CO2 20 01/19/2020   GLUCOSE 88 01/19/2020   BUN 44 (H) 01/19/2020   CREATININE 8.97 (H) 01/19/2020   BILITOT 0.2 01/19/2020   ALKPHOS 67 01/19/2020   AST 14 01/19/2020   ALT 22 01/19/2020   PROT 8.7 (H) 01/19/2020   ALBUMIN 5.2 (H) 01/19/2020   CALCIUM 8.6 (L) 01/19/2020   ANIONGAP 12 12/14/2019   Lab Results  Component Value Date   CHOL 173 12/02/2019   Lab Results  Component Value Date   HDL 25 (L) 12/02/2019   Lab Results  Component Value Date   LDLCALC 95 12/02/2019   Lab Results  Component Value Date   TRIG 267 (H) 12/02/2019   Lab Results  Component Value Date   CHOLHDL 6.9 12/02/2019   Lab Results  Component Value Date   HGBA1C 5.6 11/24/2019   HGBA1C 5.6 11/24/2019   HGBA1C 5.6 (A) 11/24/2019   HGBA1C 5.6 11/24/2019      Assessment & Plan:   Problem List Items Addressed This Visit    None     Observed seizure-like activity ([place]) 911 notified. Patient will  transition to [facility] ER for further workup and evaluation of seizure activity  Essential hypertension, benign BP (!) 95/51   Pulse 62   Temp (!) 97 F (36.1 C)   Resp 14   SpO2 99%    End-stage renal disease   Follow-up:    [name]  APRN, MSN, FNP-C Patient [facility] [address] [place], [place] [address] [phone]

## 2020-02-03 ENCOUNTER — Other Ambulatory Visit (HOSPITAL_COMMUNITY): Payer: Self-pay

## 2020-02-03 ENCOUNTER — Ambulatory Visit: Payer: Self-pay | Admitting: Vascular Surgery

## 2020-02-03 ENCOUNTER — Inpatient Hospital Stay (HOSPITAL_COMMUNITY): Admission: RE | Admit: 2020-02-03 | Payer: Self-pay | Source: Ambulatory Visit

## 2020-02-04 ENCOUNTER — Other Ambulatory Visit: Payer: Self-pay

## 2020-02-04 ENCOUNTER — Encounter: Payer: Self-pay | Admitting: Family Medicine

## 2020-02-04 ENCOUNTER — Ambulatory Visit (INDEPENDENT_AMBULATORY_CARE_PROVIDER_SITE_OTHER): Payer: Medicaid Other | Admitting: Family Medicine

## 2020-02-04 ENCOUNTER — Other Ambulatory Visit: Payer: Self-pay | Admitting: Family Medicine

## 2020-02-04 VITALS — BP 118/78 | HR 107 | Temp 97.3°F | Resp 16 | Ht 72.0 in | Wt 247.4 lb

## 2020-02-04 DIAGNOSIS — N186 End stage renal disease: Secondary | ICD-10-CM

## 2020-02-04 DIAGNOSIS — M109 Gout, unspecified: Secondary | ICD-10-CM | POA: Diagnosis not present

## 2020-02-04 DIAGNOSIS — I120 Hypertensive chronic kidney disease with stage 5 chronic kidney disease or end stage renal disease: Secondary | ICD-10-CM | POA: Diagnosis not present

## 2020-02-04 DIAGNOSIS — I10 Essential (primary) hypertension: Secondary | ICD-10-CM

## 2020-02-04 MED ORDER — CARVEDILOL 3.125 MG PO TABS: 12.5000 mg | ORAL_TABLET | Freq: Two times a day (BID) | ORAL | 1 refills | Status: DC

## 2020-02-04 MED ORDER — ISOSORBIDE DINITRATE 10 MG PO TABS: 20.0000 mg | ORAL_TABLET | Freq: Two times a day (BID) | ORAL | 1 refills | Status: DC

## 2020-02-04 MED ORDER — CARVEDILOL 3.125 MG PO TABS: 3.1250 mg | ORAL_TABLET | Freq: Two times a day (BID) | ORAL | 1 refills | Status: DC

## 2020-02-04 MED ORDER — ISOSORBIDE DINITRATE 10 MG PO TABS: 10.0000 mg | ORAL_TABLET | Freq: Two times a day (BID) | ORAL | 1 refills | Status: DC

## 2020-02-04 MED ORDER — DOCUSATE SODIUM 100 MG PO CAPS: 100.0000 mg | ORAL_CAPSULE | Freq: Two times a day (BID) | ORAL | 0 refills | Status: DC

## 2020-02-04 NOTE — Patient Instructions (Addendum)
Your medications have been reduced to the following: Carvedilol 3.125 mg every 12 hours Isosorbide 10 mg every 12 hours Please check your blood pressure at home, if you do not have a blood pressure cuff you can always check it at your local pharmacy.     Low-Purine Eating Plan A low-purine eating plan involves making food choices to limit your intake of purine. Purine is a kind of uric acid. Too much uric acid in your blood can cause certain conditions, such as gout and kidney stones. Eating a low-purine diet can help control these conditions. What are tips for following this plan? Reading food labels   Avoid foods with saturated or Trans fat.  Check the ingredient list of grains-based foods, such as bread and cereal, to make sure that they contain whole grains.  Check the ingredient list of sauces or soups to make sure they do not contain meat or fish.  When choosing soft drinks, check the ingredient list to make sure they do not contain high-fructose corn syrup. Shopping  Buy plenty of fresh fruits and vegetables.  Avoid buying canned or fresh fish.  Buy dairy products labeled as low-fat or nonfat.  Avoid buying premade or processed foods. These foods are often high in fat, salt (sodium), and added sugar. Cooking  Use olive oil instead of butter when cooking. Oils like olive oil, canola oil, and sunflower oil contain healthy fats. Meal planning  Learn which foods do or do not affect you. If you find out that a food tends to cause your gout symptoms to flare up, avoid eating that food. You can enjoy foods that do not cause problems. If you have any questions about a food item, talk with your dietitian or health care provider.  Limit foods high in fat, especially saturated fat. Fat makes it harder for your body to get rid of uric acid.  Choose foods that are lower in fat and are lean sources of protein. General guidelines  Limit alcohol intake to no more than 1 drink a day  for nonpregnant women and 2 drinks a day for men. One drink equals 12 oz of beer, 5 oz of wine, or 1 oz of hard liquor. Alcohol can affect the way your body gets rid of uric acid.  Drink plenty of water to keep your urine clear or pale yellow. Fluids can help remove uric acid from your body.  If directed by your health care provider, take a vitamin C supplement.  Work with your health care provider and dietitian to develop a plan to achieve or maintain a healthy weight. Losing weight can help reduce uric acid in your blood. What foods are recommended? The items listed may not be a complete list. Talk with your dietitian about what dietary choices are best for you. Foods low in purines Foods low in purines do not need to be limited. These include:  All fruits.  All low-purine vegetables, pickles, and olives.  Breads, pasta, rice, cornbread, and popcorn. Cake and other baked goods.  All dairy foods.  Eggs, nuts, and nut butters.  Spices and condiments, such as salt, herbs, and vinegar.  Plant oils, butter, and margarine.  Water, sugar-free soft drinks, tea, coffee, and cocoa.  Vegetable-based soups, broths, sauces, and gravies. Foods moderate in purines Foods moderate in purines should be limited to the amounts listed.   cup of asparagus, cauliflower, spinach, mushrooms, or green peas, each day.  2/3 cup uncooked oatmeal, each day.   cup dry  wheat bran or wheat germ, each day.  2-3 ounces of meat or poultry, each day.  4-6 ounces of shellfish, such as crab, lobster, oysters, or shrimp, each day.  1 cup cooked beans, peas, or lentils, each day.  Soup, broths, or bouillon made from meat or fish. Limit these foods as much as possible. What foods are not recommended? The items listed may not be a complete list. Talk with your dietitian about what dietary choices are best for you. Limit your intake of foods high in purines, including:  Beer and other  alcohol.  Meat-based gravy or sauce.  Canned or fresh fish, such as: ? Anchovies, sardines, herring, and tuna. ? Mussels and scallops. ? Codfish, trout, and haddock.  [name].  Organ meats, such as: ? Liver or kidney. ? Tripe. ? Sweetbreads (thymus gland or pancreas).  Wild Clinical biochemist.  Yeast or yeast extract supplements.  Drinks sweetened with high-fructose corn syrup. Summary  Eating a low-purine diet can help control conditions caused by too much uric acid in the body, such as gout or kidney stones.  Choose low-purine foods, limit alcohol, and limit foods high in fat.  You will learn over time which foods do or do not affect you. If you find out that a food tends to cause your gout symptoms to flare up, avoid eating that food. This information is not intended to replace advice given to you by your health care provider. Make sure you discuss any questions you have with your health care provider. Document Revised: 02/15/2017 Document Reviewed: 04/18/2016 Elsevier Patient Education  2020 [employer].

## 2020-02-05 ENCOUNTER — Telehealth: Payer: Self-pay

## 2020-02-05 LAB — URIC ACID: Uric Acid: 5.4 mg/dL (ref 3.8–8.4)

## 2020-02-05 MED FILL — CARVEDILOL 3.125 MG TABLET: 3.125 | 30 days supply | Qty: 60 | Fill #0

## 2020-02-05 MED FILL — ISOSORBIDE DN 10 MG TABLET: 10 | 30 days supply | Qty: 60 | Fill #0

## 2020-02-05 NOTE — Telephone Encounter (Signed)
-----   Message from [name], [place] sent at 02/05/2020  6:36 AM EST ----- Regarding: lab results Please inform patient that uric acid levels are within a normal range.  Allopurinol, medication that assist with controlling gout flares is not recommended for restart at this time.  Please follow-up in clinic as scheduled.  On yesterday in clinic your blood pressure medications were decreased.  Carvedilol is 3.125 mg every 12 hours and isosorbide has been reduced to 10 mg twice daily.  These changes were reflected on your current medication list.  Please do not refer to the previous list to prevent further confusion.  It is very important that you come to your follow-up appointment.  Reminder to go to the ER if any CP, SOB, nausea, dizziness, severe HA, changes vision/speech, left arm numbness and tingling and jaw pain.   [name]  APRN, MSN, FNP-C [facility] [address] [place], [place] [address] [phone]

## 2020-02-05 NOTE — Telephone Encounter (Signed)
Pt aware of results and voiced understanding, all questions answered/concerns addressed. Pt requested for provider to follow-up re: medication assistance as he cannot afford medications that were prescribed/refilled at yesterday's visit.

## 2020-02-15 ENCOUNTER — Other Ambulatory Visit: Payer: Self-pay

## 2020-02-15 ENCOUNTER — Ambulatory Visit: Payer: Self-pay | Attending: Family Medicine

## 2020-02-15 NOTE — Progress Notes (Signed)
Patient [facility]   Established Patient Office Visit  Subjective:  Patient ID: Michael [name], male    DOB: 12-28-1975  Age: 44 y.o. MRN: [id number]  CC:  Chief Complaint  Patient presents with  . Hypertension    HPI Michael [name] is a 44 year old male with a medical history significant for essential hypertension, end-stage renal disease on dialysis, sleep apnea, gouty arthritis, and polycystic kidney disease presents for post hospital follow-up.  Patient presented to clinic on 02/02/2020 for follow-up of hypertension and experienced an episode of blackout versus seizure activity in the clinic lobby.  EMS was contacted at that time and patient was transported to the emergency department.  Patient was found to have decreased blood pressure and symptoms improved with fluid resuscitation.  Patient did not warrant admission at that time.  Blood pressure medications have been slowly lowered over the past several weeks.  Patient has found that after taking blood medication he typically experiences lightheadedness, dizziness, and difficulty with balance.  He says that he feels better on the days that he does not take blood pressure medications.  Today he denies any dizziness, headache, or lightheadedness.  No shortness of breath, chest pains, urinary symptoms, nausea, vomiting, or diarrhea.  Patient did not take antihypertensive medications prior to arrival.  He is brought medications and medication list for review.  Past Medical History:  Diagnosis Date  . CKD (chronic kidney disease)   . History of renal dialysis   . Hypertension   . OSA (obstructive sleep apnea)   . Polycystic kidney disease   . Vitamin D deficiency 11/2018    Past Surgical History:  Procedure Laterality Date  . BASCILIC VEIN TRANSPOSITION Left 12/11/2019   Procedure: 1ST STAGE BASILIC TRANSPOSITION OF LEFT ARM;  Surgeon: Angelia Mould, MD;  Location: [facility];  Service: Vascular;   Laterality: Left;  . IR FLUORO GUIDE CV LINE RIGHT  12/09/2019  . IR US GUIDE VASC ACCESS RIGHT  12/09/2019  . LEFT HEART CATH AND CORONARY ANGIOGRAPHY N/A 12/09/2019   Procedure: LEFT HEART CATH AND CORONARY ANGIOGRAPHY;  Surgeon: Adrian Prows, MD;  Location: [facility];  Service: Cardiovascular;  Laterality: N/A;    Family History  Problem Relation Age of Onset  . Bipolar disorder Father     Social History   Socioeconomic History  . Marital status: Single    Spouse name: Not on file  . Number of children: Not on file  . Years of education: Not on file  . Highest education level: Not on file  Occupational History  . Not on file  Tobacco Use  . Smoking status: Former Research scientist (life sciences)  . Smokeless tobacco: Never Used  Vaping Use  . Vaping Use: Never used  Substance and Sexual Activity  . Alcohol use: Yes    Comment: wine occ   . Drug use: No  . Sexual activity: Yes  Other Topics Concern  . Not on file  Social History Narrative  . Not on file   Social Determinants of Health   Financial Resource Strain:   . Difficulty of Paying Living Expenses: Not on file  Food Insecurity:   . Worried About Charity fundraiser in the Last Year: Not on file  . Ran Out of Food in the Last Year: Not on file  Transportation Needs:   . Lack of Transportation (Medical): Not on file  . Lack of Transportation (Non-Medical): Not on file  Physical Activity:   .  Days of Exercise per Week: Not on file  . Minutes of Exercise per Session: Not on file  Stress:   . Feeling of Stress : Not on file  Social Connections:   . Frequency of Communication with Friends and Family: Not on file  . Frequency of Social Gatherings with Friends and Family: Not on file  . Attends Religious Services: Not on file  . Active Member of Clubs or Organizations: Not on file  . Attends Archivist Meetings: Not on file  . Marital Status: Not on file  Intimate Partner Violence:   . Fear of Current or Ex-Partner:  Not on file  . Emotionally Abused: Not on file  . Physically Abused: Not on file  . Sexually Abused: Not on file    Outpatient Medications Prior to Visit  Medication Sig Dispense Refill  . acetaminophen (TYLENOL) 650 MG CR tablet Take 650 mg by mouth daily.     Marland Kitchen aspirin EC 81 MG EC tablet Take 1 tablet (81 mg total) by mouth daily. Swallow whole. 30 tablet 11  . calcitRIOL (ROCALTROL) 0.25 MCG capsule Take 1 capsule (0.25 mcg total) by mouth daily. 30 capsule 0  . camphor-menthol (SARNA) lotion Apply topically as needed for itching. 222 mL 1  . multivitamin (ONE-A-DAY MEN'S) TABS tablet Take 1 tablet by mouth daily. 30 tablet 11  . nitroGLYCERIN (NITROSTAT) 0.4 MG SL tablet Place 1 tablet (0.4 mg total) under the tongue every 5 (five) minutes as needed for chest pain. 100 tablet 0  . ondansetron (ZOFRAN) 4 MG tablet Take 4 mg by mouth 3 (three) times daily as needed for nausea/vomiting.    . rosuvastatin (CRESTOR) 10 MG tablet Take 1 tablet (10 mg total) by mouth daily. 30 tablet 5  . sevelamer carbonate (RENVELA) 800 MG tablet Take 2 tablets (1,600 mg total) by mouth 3 (three) times daily with meals. 180 tablet 0  . acetaminophen (TYLENOL) 325 MG tablet Take 650 mg by mouth daily.    Marland Kitchen acetaminophen-codeine (TYLENOL #3) 300-30 MG tablet Take 1 tablet by mouth every 4 (four) hours as needed for moderate pain. 20 tablet 0  . carvedilol (COREG) 12.5 MG tablet Take 1 tablet (12.5 mg total) by mouth 2 (two) times daily with a meal. 60 tablet 1  . isosorbide dinitrate (ISORDIL) 20 MG tablet Take 1 tablet (20 mg total) by mouth 3 (three) times daily. 90 tablet 1  . oxyCODONE (OXY IR/ROXICODONE) 5 MG immediate release tablet Take 5 mg by mouth 3 (three) times daily.     No facility-administered medications prior to visit.    No Known Allergies  ROS Review of Systems  Constitutional: Negative.   HENT: Negative.   Eyes: Negative.   Respiratory: Negative.   Cardiovascular: Negative.     Gastrointestinal: Negative.   Endocrine: Negative.   Genitourinary: Negative.   Musculoskeletal: Positive for arthralgias.  Neurological: Negative.   Hematological: Negative.   Psychiatric/Behavioral: Negative.       Objective:    Physical Exam Constitutional:      Appearance: Normal appearance.  Eyes:     Pupils: Pupils are equal, round, and reactive to light.  Cardiovascular:     Rate and Rhythm: Normal rate and regular rhythm.     Pulses: Normal pulses.  Pulmonary:     Effort: Pulmonary effort is normal.  Abdominal:     General: Abdomen is flat. Bowel sounds are normal.  Musculoskeletal:     Cervical back: Normal range of motion  and neck supple.  Neurological:     General: No focal deficit present.     Mental Status: He is alert. Mental status is at baseline.  Psychiatric:        Mood and Affect: Mood normal.        Behavior: Behavior normal.        Thought Content: Thought content normal.        Judgment: Judgment normal.     BP 118/78   Pulse (!) 107   Temp (!) 97.3 F (36.3 C)   Resp 16   Ht 6' (1.829 m)   Wt 247 lb 6.4 oz (112.2 kg)   SpO2 94%   BMI 33.55 kg/m  Wt Readings from Last 3 Encounters:  02/04/20 247 lb 6.4 oz (112.2 kg)  01/19/20 252 lb (114.3 kg)  12/17/19 251 lb (113.9 kg)     Health Maintenance Due  Topic Date Due  . COVID-19 Vaccine (1) Never done    There are no preventive care reminders to display for this patient.  Lab Results  Component Value Date   TSH 2.730 12/12/2018   Lab Results  Component Value Date   WBC 11.2 (H) 02/02/2020   HGB 11.9 (L) 02/02/2020   HCT 38.0 (L) 02/02/2020   MCV 91.1 02/02/2020   PLT 295 02/02/2020   Lab Results  Component Value Date   NA 136 02/02/2020   K 5.3 (H) 02/02/2020   CO2 22 02/02/2020   GLUCOSE 115 (H) 02/02/2020   BUN 60 (H) 02/02/2020   CREATININE 10.51 (H) 02/02/2020   BILITOT 0.2 01/19/2020   ALKPHOS 67 01/19/2020   AST 14 01/19/2020   ALT 22 01/19/2020   PROT 8.7  (H) 01/19/2020   ALBUMIN 5.2 (H) 01/19/2020   CALCIUM 7.7 (L) 02/02/2020   ANIONGAP 17 (H) 02/02/2020   Lab Results  Component Value Date   CHOL 173 12/02/2019   Lab Results  Component Value Date   HDL 25 (L) 12/02/2019   Lab Results  Component Value Date   LDLCALC 95 12/02/2019   Lab Results  Component Value Date   TRIG 267 (H) 12/02/2019   Lab Results  Component Value Date   CHOLHDL 6.9 12/02/2019   Lab Results  Component Value Date   HGBA1C 5.6 11/24/2019   HGBA1C 5.6 11/24/2019   HGBA1C 5.6 (A) 11/24/2019   HGBA1C 5.6 11/24/2019      Assessment & Plan:   Problem List Items Addressed This Visit      Cardiovascular and Mediastinum   Essential hypertension, benign - Primary   Relevant Medications   carvedilol (COREG) 3.125 MG tablet   isosorbide dinitrate (ISORDIL) 10 MG tablet     Musculoskeletal and Integument   Acute gouty arthritis   Relevant Orders   Uric Acid (Completed)    Other Visit Diagnoses    End stage renal disease due to hypertension ([place])          Meds ordered this encounter  Medications  . DISCONTD: carvedilol (COREG) 3.125 MG tablet    Sig: Take 4 tablets (12.5 mg total) by mouth 2 (two) times daily with a meal.    Dispense:  60 tablet    Refill:  1    Order Specific Question:   Supervising Provider    Answer:   Tresa Garter [id number]  . DISCONTD: isosorbide dinitrate (ISORDIL) 10 MG tablet    Sig: Take 2 tablets (20 mg total) by mouth 2 (two) times daily.  Dispense:  60 tablet    Refill:  1    Order Specific Question:   Supervising Provider    Answer:   Tresa Garter [id number]  . carvedilol (COREG) 3.125 MG tablet    Sig: Take 1 tablet (3.125 mg total) by mouth 2 (two) times daily with a meal.    Dispense:  60 tablet    Refill:  1    Order Specific Question:   Supervising Provider    Answer:   Tresa Garter [id number]  . isosorbide dinitrate (ISORDIL) 10 MG tablet    Sig: Take 1 tablet (10 mg total)  by mouth 2 (two) times daily.    Dispense:  60 tablet    Refill:  1    Order Specific Question:   Supervising Provider    Answer:   Tresa Garter [id number]  . docusate sodium (COLACE) 100 MG capsule    Sig: Take 1 capsule (100 mg total) by mouth 2 (two) times daily.    Dispense:  60 capsule    Refill:  0    Order Specific Question:   Supervising Provider    Answer:   Angelica Chessman E [[id number]]   3. Essential hypertension, benign Antihypertensive medications were reduced.  Carvedilol was decreased to 3.125 mg twice daily with food. Amlodipine was discontinued. Isosorbide dinitrate was decreased to 10 mg twice daily.  Patient advised to check blood pressure twice daily and maintain a journal.  He will return to clinic in 2 weeks for blood pressure follow-up. - carvedilol (COREG) 3.125 MG tablet; Take 1 tablet (3.125 mg total) by mouth 2 (two) times daily with a meal.  Dispense: 60 tablet; Refill: 1 - isosorbide dinitrate (ISORDIL) 10 MG tablet; Take 1 tablet (10 mg total) by mouth 2 (two) times daily.  Dispense: 60 tablet; Refill: 1  2. Acute gouty arthritis Patient endorses some bilateral foot pain that is consistent with previous episodes of gout.  Patient was advised to discontinue allopurinol several months ago due to end-stage renal disease.  Will review uric acid level.  Patient advised to continue Tylenol 1000 mg every 6 hours as needed for moderate pain. - Uric Acid  3. End stage renal disease due to hypertension [facility]) Continue dialysis on Monday, Wednesday, and Friday.  Also, follow-up with nephrology as scheduled.  Discussed the importance of consistent follow-up.  Follow-up: 2 weeks for hypotension and medication management   [name]  APRN, MSN, FNP-C [facility] [address] [place], [place] [address] [phone]

## 2020-02-16 ENCOUNTER — Encounter: Payer: Self-pay | Admitting: Family Medicine

## 2020-02-16 ENCOUNTER — Ambulatory Visit (INDEPENDENT_AMBULATORY_CARE_PROVIDER_SITE_OTHER): Payer: Medicaid Other | Admitting: Family Medicine

## 2020-02-16 VITALS — BP 108/75 | HR 89 | Temp 97.3°F | Resp 20 | Ht 74.0 in | Wt 252.2 lb

## 2020-02-16 DIAGNOSIS — N186 End stage renal disease: Secondary | ICD-10-CM | POA: Diagnosis not present

## 2020-02-16 DIAGNOSIS — I10 Essential (primary) hypertension: Secondary | ICD-10-CM | POA: Diagnosis not present

## 2020-02-16 DIAGNOSIS — Z992 Dependence on renal dialysis: Secondary | ICD-10-CM

## 2020-02-16 LAB — POCT URINALYSIS DIPSTICK
Glucose, UA: NEGATIVE
Nitrite, UA: NEGATIVE
Protein, UA: POSITIVE — AB
Spec Grav, UA: 1.025 (ref 1.010–1.025)
Urobilinogen, UA: 0.2 E.U./dL
pH, UA: 5.5 (ref 5.0–8.0)

## 2020-02-16 NOTE — Progress Notes (Signed)
Patient [name]   Subjective:  Patient ID: Michael [name], male    DOB: [birth year]  Age: 44 y.o. MRN: [id number]  CC:  Chief Complaint  Patient presents with  . Follow-up    HPI Michael [name] [address] is a 44 year old male with a medical history significant for essential hypertension, end-stage renal disease on dialysis, sleep apnea, gouty arthritis, and polycystic kidney disease presents for follow-up of hypertension.  Over the past several weeks, patient has been periodically hypotensive.  Patient is found that since starting dialysis blood pressure has been low and he has been afraid to take antihypertensive medications.  Antihypertensives have been lowered significantly over the past several weeks.  Patient states that he is starting to feel better.  He has not had any episodes of syncope over the past week.  He denies any headache, chest pain, urinary symptoms, shortness of breath, dizziness, or lightheadedness today.  Past Medical History:  Diagnosis Date  . CKD (chronic kidney disease)   . History of renal dialysis   . Hypertension   . OSA (obstructive sleep apnea)   . Polycystic kidney disease   . Vitamin D deficiency 11/2018    Past Surgical History:  Procedure Laterality Date  . BASCILIC VEIN TRANSPOSITION Left 12/11/2019   Procedure: 1ST STAGE BASILIC TRANSPOSITION OF LEFT ARM;  Surgeon: Angelia Mould, MD;  Location: [facility];  Service: Vascular;  Laterality: Left;  . IR FLUORO GUIDE CV LINE RIGHT  12/09/2019  . IR US GUIDE VASC ACCESS RIGHT  12/09/2019  . LEFT HEART CATH AND CORONARY ANGIOGRAPHY N/A 12/09/2019   Procedure: LEFT HEART CATH AND CORONARY ANGIOGRAPHY;  Surgeon: Adrian Prows, MD;  Location: [facility];  Service: Cardiovascular;  Laterality: N/A;    Family History  Problem Relation Age of Onset  . Bipolar disorder Father     Social History   Socioeconomic History  . Marital status: Single    Spouse  name: Not on file  . Number of children: Not on file  . Years of education: Not on file  . Highest education level: Not on file  Occupational History  . Not on file  Tobacco Use  . Smoking status: Former Research scientist (life sciences)  . Smokeless tobacco: Never Used  Vaping Use  . Vaping Use: Never used  Substance and Sexual Activity  . Alcohol use: Yes    Comment: wine occ   . Drug use: No  . Sexual activity: Yes  Other Topics Concern  . Not on file  Social History Narrative  . Not on file   Social Determinants of Health   Financial Resource Strain:   . Difficulty of Paying Living Expenses: Not on file  Food Insecurity:   . Worried About Charity fundraiser in the Last Year: Not on file  . Ran Out of Food in the Last Year: Not on file  Transportation Needs:   . Lack of Transportation (Medical): Not on file  . Lack of Transportation (Non-Medical): Not on file  Physical Activity:   . Days of Exercise per Week: Not on file  . Minutes of Exercise per Session: Not on file  Stress:   . Feeling of Stress : Not on file  Social Connections:   . Frequency of Communication with Friends and Family: Not on file  . Frequency of Social Gatherings with Friends and Family: Not on file  . Attends Religious Services: Not on file  . Active Member of  Clubs or Organizations: Not on file  . Attends Archivist Meetings: Not on file  . Marital Status: Not on file  Intimate Partner Violence:   . Fear of Current or Ex-Partner: Not on file  . Emotionally Abused: Not on file  . Physically Abused: Not on file  . Sexually Abused: Not on file    Outpatient Medications Prior to Visit  Medication Sig Dispense Refill  . acetaminophen (TYLENOL) 650 MG CR tablet Take 650 mg by mouth daily.     Marland Kitchen aspirin EC 81 MG EC tablet Take 1 tablet (81 mg total) by mouth daily. Swallow whole. 30 tablet 11  . calcitRIOL (ROCALTROL) 0.25 MCG capsule Take 1 capsule (0.25 mcg total) by mouth daily. 30 capsule 0  .  camphor-menthol (SARNA) lotion Apply topically as needed for itching. 222 mL 1  . carvedilol (COREG) 3.125 MG tablet Take 1 tablet (3.125 mg total) by mouth 2 (two) times daily with a meal. 60 tablet 1  . docusate sodium (COLACE) 100 MG capsule Take 1 capsule (100 mg total) by mouth 2 (two) times daily. 60 capsule 0  . isosorbide dinitrate (ISORDIL) 10 MG tablet Take 1 tablet (10 mg total) by mouth 2 (two) times daily. 60 tablet 1  . multivitamin (ONE-A-DAY MEN'S) TABS tablet Take 1 tablet by mouth daily. 30 tablet 11  . nitroGLYCERIN (NITROSTAT) 0.4 MG SL tablet Place 1 tablet (0.4 mg total) under the tongue every 5 (five) minutes as needed for chest pain. 100 tablet 0  . ondansetron (ZOFRAN) 4 MG tablet Take 4 mg by mouth 3 (three) times daily as needed for nausea/vomiting.    . rosuvastatin (CRESTOR) 10 MG tablet Take 1 tablet (10 mg total) by mouth daily. 30 tablet 5  . sevelamer carbonate (RENVELA) 800 MG tablet Take 2 tablets (1,600 mg total) by mouth 3 (three) times daily with meals. 180 tablet 0   No facility-administered medications prior to visit.    No Known Allergies  ROS Review of Systems  Constitutional: Negative for activity change and appetite change.  HENT: Negative.   Eyes: Negative.   Respiratory: Negative.   Cardiovascular: Negative.   Gastrointestinal: Negative.   Endocrine: Negative.   Genitourinary: Negative.   Musculoskeletal: Negative.   Skin: Negative.   Neurological: Negative.   Hematological: Negative.   Psychiatric/Behavioral: Negative.       Objective:    Physical Exam Constitutional:      Appearance: Normal appearance.  HENT:     Mouth/Throat:     Mouth: Mucous membranes are moist.     Pharynx: Oropharynx is clear.  Eyes:     Pupils: Pupils are equal, round, and reactive to light.  Cardiovascular:     Rate and Rhythm: Normal rate and regular rhythm.     Pulses: Normal pulses.  Pulmonary:     Effort: Pulmonary effort is normal.     Breath  sounds: Normal breath sounds.  Abdominal:     General: Bowel sounds are normal.  Skin:    General: Skin is warm.  Neurological:     General: No focal deficit present.     Mental Status: He is alert. Mental status is at baseline.  Psychiatric:        Mood and Affect: Mood normal.        Thought Content: Thought content normal.        Judgment: Judgment normal.    There were no vitals taken for this visit. Wt Readings from Last  3 Encounters:  02/04/20 247 lb 6.4 oz (112.2 kg)  01/19/20 252 lb (114.3 kg)  12/17/19 251 lb (113.9 kg)     Health Maintenance Due  Topic Date Due  . COVID-19 Vaccine (1) Never done    There are no preventive care reminders to display for this patient.  Lab Results  Component Value Date   TSH 2.730 12/12/2018   Lab Results  Component Value Date   WBC 11.2 (H) 02/02/2020   HGB 11.9 (L) 02/02/2020   HCT 38.0 (L) 02/02/2020   MCV 91.1 02/02/2020   PLT 295 02/02/2020   Lab Results  Component Value Date   NA 136 02/02/2020   K 5.3 (H) 02/02/2020   CO2 22 02/02/2020   GLUCOSE 115 (H) 02/02/2020   BUN 60 (H) 02/02/2020   CREATININE 10.51 (H) 02/02/2020   BILITOT 0.2 01/19/2020   ALKPHOS 67 01/19/2020   AST 14 01/19/2020   ALT 22 01/19/2020   PROT 8.7 (H) 01/19/2020   ALBUMIN 5.2 (H) 01/19/2020   CALCIUM 7.7 (L) 02/02/2020   ANIONGAP 17 (H) 02/02/2020   Lab Results  Component Value Date   CHOL 173 12/02/2019   Lab Results  Component Value Date   HDL 25 (L) 12/02/2019   Lab Results  Component Value Date   LDLCALC 95 12/02/2019   Lab Results  Component Value Date   TRIG 267 (H) 12/02/2019   Lab Results  Component Value Date   CHOLHDL 6.9 12/02/2019   Lab Results  Component Value Date   HGBA1C 5.6 11/24/2019   HGBA1C 5.6 11/24/2019   HGBA1C 5.6 (A) 11/24/2019   HGBA1C 5.6 11/24/2019      Assessment & Plan:   Problem List Items Addressed This Visit      Cardiovascular and Mediastinum   Essential hypertension,  benign - Primary   Relevant Orders   Urinalysis Dipstick    Other Visit Diagnoses    CKD (chronic kidney disease) stage V requiring chronic dialysis (HCC)         BP 108/75 (BP Location: Right Arm, Patient Position: Sitting, Cuff Size: Large)   Pulse 89   Temp (!) 97.3 F (36.3 C) (Temporal)   Resp 20   Ht 6\' 2"  (1.88 m)   Wt 252 lb 3.2 oz (114.4 kg)   SpO2 98%   BMI 32.38 kg/m   Essential hypertension, benign Discontinued amlodipine.  We will continue isosorbide dinitrate 10 mg twice daily, and carvedilol 3.125 mg 2 times daily with meals.  Patient has been given a copy of medication regimen. - Urinalysis Dipstick  CKD (chronic kidney disease) stage V requiring chronic dialysis ([facility]) Continue dialysis on Monday, Wednesday, and Fridays.  Also, follow-up with nephrology as scheduled.  Follow-up: Return in about 3 months (around 05/16/2020).    [name]  APRN, MSN, FNP-C [facility] [address] [place], [place] [address] [phone]

## 2020-03-01 ENCOUNTER — Other Ambulatory Visit: Payer: Self-pay

## 2020-03-01 DIAGNOSIS — N179 Acute kidney failure, unspecified: Secondary | ICD-10-CM

## 2020-03-15 ENCOUNTER — Ambulatory Visit: Payer: Self-pay | Admitting: Family Medicine

## 2020-03-15 ENCOUNTER — Encounter: Payer: Self-pay | Admitting: Vascular Surgery

## 2020-03-15 ENCOUNTER — Ambulatory Visit (HOSPITAL_COMMUNITY)
Admission: RE | Admit: 2020-03-15 | Discharge: 2020-03-15 | Disposition: A | Discharge status: Home / Self Care | Payer: Medicare Other | Source: Ambulatory Visit | Attending: Vascular Surgery | Admitting: Vascular Surgery

## 2020-03-15 ENCOUNTER — Other Ambulatory Visit: Payer: Self-pay

## 2020-03-15 ENCOUNTER — Ambulatory Visit (INDEPENDENT_AMBULATORY_CARE_PROVIDER_SITE_OTHER)
Admission: RE | Admit: 2020-03-15 | Discharge: 2020-03-15 | Disposition: A | Discharge status: Home / Self Care | Payer: Medicare Other | Source: Ambulatory Visit | Attending: Vascular Surgery | Admitting: Vascular Surgery

## 2020-03-15 ENCOUNTER — Ambulatory Visit (INDEPENDENT_AMBULATORY_CARE_PROVIDER_SITE_OTHER): Payer: Medicare Other | Admitting: Vascular Surgery

## 2020-03-15 VITALS — BP 131/86 | HR 94 | Temp 97.9°F | Resp 20 | Ht 74.0 in | Wt 252.0 lb

## 2020-03-15 DIAGNOSIS — N179 Acute kidney failure, unspecified: Secondary | ICD-10-CM

## 2020-03-15 DIAGNOSIS — N186 End stage renal disease: Secondary | ICD-10-CM

## 2020-03-15 DIAGNOSIS — Z992 Dependence on renal dialysis: Secondary | ICD-10-CM | POA: Diagnosis not present

## 2020-03-15 NOTE — Progress Notes (Signed)
ASSESSMENT & PLAN:  44 y.o. male with ESRD status post left first stage basilic vein transposition 12/11/19. This has unfortunately thrombosed. I explained the typical progression for access surgery.  I counseled him that a right upper extremity basilic vein transposition would ideally be performed.  He does not desire any access surgery in his right arm.  He is also not interested in a left forearm loop graft.  We will perform a left upper extremity brachial axillary AV graft 03/26/2019.  CHIEF COMPLAINT:   Dialysis access  HISTORY:  HISTORY OF PRESENT ILLNESS: Michael [name] is a 44 y.o. male with new diagnosis of end-stage renal disease.  He is dialyzing through a right IJ tunneled dialysis catheter Monday Wednesday and Friday.  He had a left upper extremity for stage basilic vein transposition 12/11/2019.  This has unfortunately thrombosed.  His incisions have healed well.  Past Medical History:  Diagnosis Date  . CKD (chronic kidney disease)   . History of renal dialysis   . Hypertension   . OSA (obstructive sleep apnea)   . Polycystic kidney disease   . Vitamin D deficiency 11/2018    Past Surgical History:  Procedure Laterality Date  . BASCILIC VEIN TRANSPOSITION Left 12/11/2019   Procedure: 1ST STAGE BASILIC TRANSPOSITION OF LEFT ARM;  Surgeon: Angelia Mould, MD;  Location: [facility];  Service: Vascular;  Laterality: Left;  . IR FLUORO GUIDE CV LINE RIGHT  12/09/2019  . IR US GUIDE VASC ACCESS RIGHT  12/09/2019  . LEFT HEART CATH AND CORONARY ANGIOGRAPHY N/A 12/09/2019   Procedure: LEFT HEART CATH AND CORONARY ANGIOGRAPHY;  Surgeon: Adrian Prows, MD;  Location: [facility];  Service: Cardiovascular;  Laterality: N/A;    Family History  Problem Relation Age of Onset  . Bipolar disorder Father     Social History   Socioeconomic History  . Marital status: Single    Spouse name: Not on file  . Number of children: Not on file  . Years of education: Not on file  .  Highest education level: Not on file  Occupational History  . Not on file  Tobacco Use  . Smoking status: Former Research scientist (life sciences)  . Smokeless tobacco: Never Used  Vaping Use  . Vaping Use: Never used  Substance and Sexual Activity  . Alcohol use: Yes    Comment: wine occ   . Drug use: No  . Sexual activity: Yes  Other Topics Concern  . Not on file  Social History Narrative  . Not on file   Social Determinants of Health   Financial Resource Strain: Not on file  Food Insecurity: Not on file  Transportation Needs: Not on file  Physical Activity: Not on file  Stress: Not on file  Social Connections: Not on file  Intimate Partner Violence: Not on file    No Known Allergies  Current Outpatient Medications  Medication Sig Dispense Refill  . acetaminophen (TYLENOL) 650 MG CR tablet Take 650 mg by mouth daily.    Marland Kitchen aspirin EC 81 MG EC tablet Take 1 tablet (81 mg total) by mouth daily. Swallow whole. 30 tablet 11  . calcitRIOL (ROCALTROL) 0.25 MCG capsule Take 1 capsule (0.25 mcg total) by mouth daily. 30 capsule 0  . camphor-menthol (SARNA) lotion Apply topically as needed for itching. 222 mL 1  . carvedilol (COREG) 3.125 MG tablet Take 1 tablet (3.125 mg total) by mouth 2 (two) times daily with a meal. 60 tablet 1  . docusate  sodium (COLACE) 100 MG capsule Take 1 capsule (100 mg total) by mouth 2 (two) times daily. 60 capsule 0  . isosorbide dinitrate (ISORDIL) 10 MG tablet Take 1 tablet (10 mg total) by mouth 2 (two) times daily. 60 tablet 1  . multivitamin (ONE-A-DAY MEN'S) TABS tablet Take 1 tablet by mouth daily. 30 tablet 11  . nitroGLYCERIN (NITROSTAT) 0.4 MG SL tablet Place 1 tablet (0.4 mg total) under the tongue every 5 (five) minutes as needed for chest pain. 100 tablet 0  . ondansetron (ZOFRAN) 4 MG tablet Take 4 mg by mouth 3 (three) times daily as needed for nausea/vomiting.    . rosuvastatin (CRESTOR) 10 MG tablet Take 1 tablet (10 mg total) by mouth daily. 30 tablet 5  .  sevelamer carbonate (RENVELA) 800 MG tablet Take 2 tablets (1,600 mg total) by mouth 3 (three) times daily with meals. 180 tablet 0   No current facility-administered medications for this visit.    REVIEW OF SYSTEMS:  [X]  denotes positive finding, [ ]  denotes negative finding Cardiac  Comments:  Chest pain or chest pressure:    Shortness of breath upon exertion:    Short of breath when lying flat:    Irregular heart rhythm:        Vascular    Pain in calf, thigh, or hip brought on by ambulation:    Pain in feet at night that wakes you up from your sleep:     Blood clot in your veins:    Leg swelling:         Pulmonary    Oxygen at home:    Productive cough:     Wheezing:         Neurologic    Sudden weakness in arms or legs:     Sudden numbness in arms or legs:     Sudden onset of difficulty speaking or slurred speech:    Temporary loss of vision in one eye:     Problems with dizziness:         Gastrointestinal    Blood in stool:     Vomited blood:         Genitourinary    Burning when urinating:     Blood in urine:        Psychiatric    Major depression:         Hematologic    Bleeding problems:    Problems with blood clotting too easily:        Skin    Rashes or ulcers:        Constitutional    Fever or chills:     PHYSICAL EXAM:   Vitals:   03/15/20 0852  BP: 131/86  Pulse: 94  Resp: 20  Temp: 97.9 F (36.6 C)  SpO2: 95%  Weight: 252 lb (114.3 kg)  Height: 6\' 2"  (1.88 m)   Constitutional: Well appearing in no distress. Appears well nourished.  Neurologic: Normal gait and station. CN intact. No weakness. No sensory loss. Psychiatric: Mood and affect symmetric and appropriate. Eyes: No icterus. No conjunctival pallor. Ears, nose, throat: mucous membranes moist. Midline trachea. No carotid bruit. Cardiac: regular rate and rhythm.  Respiratory: unlabored. Abdominal: soft, non-tender, non-distended. No palpable pulsatile abdominal  mass. Peripheral vascular:  Radial pulse: L 2+ / R 2+  Thrombosed LUE brachiobasilic AVF  Well healed incisions over medial left arm Extremity: No edema. No cyanosis. No pallor.  Skin: No gangrene. No ulceration.  Lymphatic: No Stemmer's  sign. No palpable lymphadenopathy.   DATA REVIEW:    Most recent CBC CBC Latest Ref Rng & Units 02/02/2020 01/19/2020 12/14/2019  WBC 4.0 - 10.5 K/uL 11.2(H) 13.7(H) 9.7  Hemoglobin 13.0 - 17.0 g/dL 11.9(L) 12.3(L) 9.2(L)  Hematocrit 39.0 - 52.0 % 38.0(L) 37.9 29.7(L)  Platelets 150 - 400 K/uL 295 295 266     Most recent CMP CMP Latest Ref Rng & Units 02/02/2020 01/19/2020 12/14/2019  Glucose 70 - 99 mg/dL 115(H) 88 91  BUN 6 - 20 mg/dL 60(H) 44(H) 65(H)  Creatinine 0.61 - 1.24 mg/dL 10.51(H) 8.97(H) 8.28(H)  Sodium 135 - 145 mmol/L 136 139 134(L)  Potassium 3.5 - 5.1 mmol/L 5.3(H) 5.4(H) 4.6  Chloride 98 - 111 mmol/L 97(L) 94(L) 99  CO2 22 - 32 mmol/L 22 20 23   Calcium 8.9 - 10.3 mg/dL 7.7(L) 8.6(L) 9.0  Total Protein 6.0 - 8.5 g/dL - 8.7(H) -  Total Bilirubin 0.0 - 1.2 mg/dL - 0.2 -  Alkaline Phos 44 - 121 IU/L - 67 -  AST 0 - 40 IU/L - 14 -  ALT 0 - 44 IU/L - 22 -    Renal function CrCl cannot be calculated (Patient's most recent lab result is older than the maximum 21 days allowed.).  Hemoglobin A1C (%)  Date Value  11/24/2019 5.6   HbA1c, POC (prediabetic range) (%)  Date Value  11/24/2019 5.6 (A)   HbA1c, POC (controlled diabetic range) (%)  Date Value  11/24/2019 5.6   HbA1c POC (<> result, manual entry) (%)  Date Value  11/24/2019 5.6    LDL Chol Calc (NIH)  Date Value Ref Range Status  12/12/2018 138 (H) 0 - 99 mg/dL Final   LDL Cholesterol  Date Value Ref Range Status  12/02/2019 95 0 - 99 mg/dL Final    Comment:           Total Cholesterol/HDL:CHD Risk Coronary Heart Disease Risk Table                     Men   Women  1/2 Average Risk   3.4   3.3  Average Risk       5.0   4.4  2 X Average Risk   9.6    7.1  3 X Average Risk  23.4   11.0        Use the calculated Patient Ratio above and the CHD Risk Table to determine the patient's CHD Risk.        ATP III CLASSIFICATION (LDL):  <100     mg/dL   Optimal  100-129  mg/dL   Near or Above                    Optimal  130-159  mg/dL   Borderline  160-189  mg/dL   High  >190     mg/dL   Very High Performed at [facility] [address], [place], [place] [address]         Yevonne Aline. Stanford Breed, MD [facility] Phone Number: [phone] 03/15/2020 8:50 AM

## 2020-03-15 NOTE — H&P (View-Only) (Signed)
ASSESSMENT & PLAN:  44 y.o. male with ESRD status post left first stage basilic vein transposition 12/11/19. This has unfortunately thrombosed. I explained the typical progression for access surgery.  I counseled him that a right upper extremity basilic vein transposition would ideally be performed.  He does not desire any access surgery in his right arm.  He is also not interested in a left forearm loop graft.  We will perform a left upper extremity brachial axillary AV graft 03/26/2019.  CHIEF COMPLAINT:   Dialysis access  HISTORY:  HISTORY OF PRESENT ILLNESS: Michael [name] is a 44 y.o. male with new diagnosis of end-stage renal disease.  He is dialyzing through a right IJ tunneled dialysis catheter Monday Wednesday and Friday.  He had a left upper extremity for stage basilic vein transposition 12/11/2019.  This has unfortunately thrombosed.  His incisions have healed well.  Past Medical History:  Diagnosis Date  . CKD (chronic kidney disease)   . History of renal dialysis   . Hypertension   . OSA (obstructive sleep apnea)   . Polycystic kidney disease   . Vitamin D deficiency 11/2018    Past Surgical History:  Procedure Laterality Date  . BASCILIC VEIN TRANSPOSITION Left 12/11/2019   Procedure: 1ST STAGE BASILIC TRANSPOSITION OF LEFT ARM;  Surgeon: Angelia Mould, MD;  Location: [facility];  Service: Vascular;  Laterality: Left;  . IR FLUORO GUIDE CV LINE RIGHT  12/09/2019  . IR US GUIDE VASC ACCESS RIGHT  12/09/2019  . LEFT HEART CATH AND CORONARY ANGIOGRAPHY N/A 12/09/2019   Procedure: LEFT HEART CATH AND CORONARY ANGIOGRAPHY;  Surgeon: Adrian Prows, MD;  Location: [facility];  Service: Cardiovascular;  Laterality: N/A;    Family History  Problem Relation Age of Onset  . Bipolar disorder Father     Social History   Socioeconomic History  . Marital status: Single    Spouse name: Not on file  . Number of children: Not on file  . Years of education: Not on file  .  Highest education level: Not on file  Occupational History  . Not on file  Tobacco Use  . Smoking status: Former Research scientist (life sciences)  . Smokeless tobacco: Never Used  Vaping Use  . Vaping Use: Never used  Substance and Sexual Activity  . Alcohol use: Yes    Comment: wine occ   . Drug use: No  . Sexual activity: Yes  Other Topics Concern  . Not on file  Social History Narrative  . Not on file   Social Determinants of Health   Financial Resource Strain: Not on file  Food Insecurity: Not on file  Transportation Needs: Not on file  Physical Activity: Not on file  Stress: Not on file  Social Connections: Not on file  Intimate Partner Violence: Not on file    No Known Allergies  Current Outpatient Medications  Medication Sig Dispense Refill  . acetaminophen (TYLENOL) 650 MG CR tablet Take 650 mg by mouth daily.    Marland Kitchen aspirin EC 81 MG EC tablet Take 1 tablet (81 mg total) by mouth daily. Swallow whole. 30 tablet 11  . calcitRIOL (ROCALTROL) 0.25 MCG capsule Take 1 capsule (0.25 mcg total) by mouth daily. 30 capsule 0  . camphor-menthol (SARNA) lotion Apply topically as needed for itching. 222 mL 1  . carvedilol (COREG) 3.125 MG tablet Take 1 tablet (3.125 mg total) by mouth 2 (two) times daily with a meal. 60 tablet 1  . docusate  sodium (COLACE) 100 MG capsule Take 1 capsule (100 mg total) by mouth 2 (two) times daily. 60 capsule 0  . isosorbide dinitrate (ISORDIL) 10 MG tablet Take 1 tablet (10 mg total) by mouth 2 (two) times daily. 60 tablet 1  . multivitamin (ONE-A-DAY MEN'S) TABS tablet Take 1 tablet by mouth daily. 30 tablet 11  . nitroGLYCERIN (NITROSTAT) 0.4 MG SL tablet Place 1 tablet (0.4 mg total) under the tongue every 5 (five) minutes as needed for chest pain. 100 tablet 0  . ondansetron (ZOFRAN) 4 MG tablet Take 4 mg by mouth 3 (three) times daily as needed for nausea/vomiting.    . rosuvastatin (CRESTOR) 10 MG tablet Take 1 tablet (10 mg total) by mouth daily. 30 tablet 5  .  sevelamer carbonate (RENVELA) 800 MG tablet Take 2 tablets (1,600 mg total) by mouth 3 (three) times daily with meals. 180 tablet 0   No current facility-administered medications for this visit.    REVIEW OF SYSTEMS:  [X]  denotes positive finding, [ ]  denotes negative finding Cardiac  Comments:  Chest pain or chest pressure:    Shortness of breath upon exertion:    Short of breath when lying flat:    Irregular heart rhythm:        Vascular    Pain in calf, thigh, or hip brought on by ambulation:    Pain in feet at night that wakes you up from your sleep:     Blood clot in your veins:    Leg swelling:         Pulmonary    Oxygen at home:    Productive cough:     Wheezing:         Neurologic    Sudden weakness in arms or legs:     Sudden numbness in arms or legs:     Sudden onset of difficulty speaking or slurred speech:    Temporary loss of vision in one eye:     Problems with dizziness:         Gastrointestinal    Blood in stool:     Vomited blood:         Genitourinary    Burning when urinating:     Blood in urine:        Psychiatric    Major depression:         Hematologic    Bleeding problems:    Problems with blood clotting too easily:        Skin    Rashes or ulcers:        Constitutional    Fever or chills:     PHYSICAL EXAM:   Vitals:   03/15/20 0852  BP: 131/86  Pulse: 94  Resp: 20  Temp: 97.9 F (36.6 C)  SpO2: 95%  Weight: 252 lb (114.3 kg)  Height: 6\' 2"  (1.88 m)   Constitutional: Well appearing in no distress. Appears well nourished.  Neurologic: Normal gait and station. CN intact. No weakness. No sensory loss. Psychiatric: Mood and affect symmetric and appropriate. Eyes: No icterus. No conjunctival pallor. Ears, nose, throat: mucous membranes moist. Midline trachea. No carotid bruit. Cardiac: regular rate and rhythm.  Respiratory: unlabored. Abdominal: soft, non-tender, non-distended. No palpable pulsatile abdominal  mass. Peripheral vascular:  Radial pulse: L 2+ / R 2+  Thrombosed LUE brachiobasilic AVF  Well healed incisions over medial left arm Extremity: No edema. No cyanosis. No pallor.  Skin: No gangrene. No ulceration.  Lymphatic: No Stemmer's  sign. No palpable lymphadenopathy.   DATA REVIEW:    Most recent CBC CBC Latest Ref Rng & Units 02/02/2020 01/19/2020 12/14/2019  WBC 4.0 - 10.5 K/uL 11.2(H) 13.7(H) 9.7  Hemoglobin 13.0 - 17.0 g/dL 11.9(L) 12.3(L) 9.2(L)  Hematocrit 39.0 - 52.0 % 38.0(L) 37.9 29.7(L)  Platelets 150 - 400 K/uL 295 295 266     Most recent CMP CMP Latest Ref Rng & Units 02/02/2020 01/19/2020 12/14/2019  Glucose 70 - 99 mg/dL 115(H) 88 91  BUN 6 - 20 mg/dL 60(H) 44(H) 65(H)  Creatinine 0.61 - 1.24 mg/dL 10.51(H) 8.97(H) 8.28(H)  Sodium 135 - 145 mmol/L 136 139 134(L)  Potassium 3.5 - 5.1 mmol/L 5.3(H) 5.4(H) 4.6  Chloride 98 - 111 mmol/L 97(L) 94(L) 99  CO2 22 - 32 mmol/L 22 20 23   Calcium 8.9 - 10.3 mg/dL 7.7(L) 8.6(L) 9.0  Total Protein 6.0 - 8.5 g/dL - 8.7(H) -  Total Bilirubin 0.0 - 1.2 mg/dL - 0.2 -  Alkaline Phos 44 - 121 IU/L - 67 -  AST 0 - 40 IU/L - 14 -  ALT 0 - 44 IU/L - 22 -    Renal function CrCl cannot be calculated (Patient's most recent lab result is older than the maximum 21 days allowed.).  Hemoglobin A1C (%)  Date Value  11/24/2019 5.6   HbA1c, POC (prediabetic range) (%)  Date Value  11/24/2019 5.6 (A)   HbA1c, POC (controlled diabetic range) (%)  Date Value  11/24/2019 5.6   HbA1c POC (<> result, manual entry) (%)  Date Value  11/24/2019 5.6    LDL Chol Calc (NIH)  Date Value Ref Range Status  12/12/2018 138 (H) 0 - 99 mg/dL Final   LDL Cholesterol  Date Value Ref Range Status  12/02/2019 95 0 - 99 mg/dL Final    Comment:           Total Cholesterol/HDL:CHD Risk Coronary Heart Disease Risk Table                     Men   Women  1/2 Average Risk   3.4   3.3  Average Risk       5.0   4.4  2 X Average Risk   9.6    7.1  3 X Average Risk  23.4   11.0        Use the calculated Patient Ratio above and the CHD Risk Table to determine the patient's CHD Risk.        ATP III CLASSIFICATION (LDL):  <100     mg/dL   Optimal  100-129  mg/dL   Near or Above                    Optimal  130-159  mg/dL   Borderline  160-189  mg/dL   High  >190     mg/dL   Very High Performed at [facility] [address], [place], [place] [address]         Yevonne Aline. Stanford Breed, MD [facility] Phone Number: [phone] 03/15/2020 8:50 AM

## 2020-03-18 ENCOUNTER — Other Ambulatory Visit: Payer: Self-pay | Admitting: Internal Medicine

## 2020-03-18 ENCOUNTER — Other Ambulatory Visit: Payer: Self-pay | Admitting: Family Medicine

## 2020-03-18 MED FILL — ROSUVASTATIN CALCIUM 10 MG: 10 | 90 days supply | Qty: 90 | Fill #1

## 2020-03-18 MED FILL — SEVELAMER CARBONATE 800 MG: 800 | 30 days supply | Qty: 180 | Fill #0

## 2020-03-18 NOTE — Telephone Encounter (Signed)
This patient is this from another provider. Are you okay this for him

## 2020-03-22 ENCOUNTER — Other Ambulatory Visit (HOSPITAL_COMMUNITY)
Admission: RE | Admit: 2020-03-22 | Discharge: 2020-03-22 | Disposition: A | Discharge status: Home / Self Care | Payer: Medicare Other | Source: Ambulatory Visit | Attending: Vascular Surgery | Admitting: Vascular Surgery

## 2020-03-22 DIAGNOSIS — Z01812 Encounter for preprocedural laboratory examination: Secondary | ICD-10-CM | POA: Diagnosis present

## 2020-03-22 DIAGNOSIS — Z20822 Contact with and (suspected) exposure to covid-19: Secondary | ICD-10-CM | POA: Insufficient documentation

## 2020-03-23 ENCOUNTER — Other Ambulatory Visit: Payer: Self-pay

## 2020-03-23 ENCOUNTER — Encounter (HOSPITAL_COMMUNITY): Payer: Self-pay | Admitting: *Deleted

## 2020-03-23 LAB — SARS CORONAVIRUS 2 (TAT 6-24 HRS): SARS Coronavirus 2: NEGATIVE

## 2020-03-23 NOTE — Progress Notes (Signed)
Patient denies shortness of breath, fever, cough or chest pain.  PCP - Cammie Sickle, FNP Cardiologist - n/a  Chest x-ray - 02/02/20 (2V) EKG - 02/02/20 Stress Test - 12/02/19 ECHO - 11/30/19 Cardiac Cath - 12/09/19  Sleep Study -  Yes CPAP - Uses cpap nightly  Anesthesia review: Yes  STOP now taking any Aspirin (unless otherwise instructed by your surgeon), Aleve, Naproxen, Ibuprofen, Motrin, Advil, Goody's, BC's, all herbal medications, fish oil, and all vitamins.   Coronavirus Screening Covid test on 03/22/20 was negative  Patient stated he did not have a ride home tomorrow after surgery.  Informed patient that he could not go home by taxi, bus, [place], or scat unless he has someone with him over the age 49 that can ride with him in the vehicle.  Advised patient to call MD  Gave patient MD's office number.  Patient agreed to call MD's office to discuss.  Patient verbalized understanding of instructions that were given via phone.

## 2020-03-23 NOTE — Progress Notes (Signed)
Anesthesia Chart Review: SAME DAY WORK-UP   Case: [id number] Date/Time: 03/24/20 0830   Procedure: INSERTION OF LEFT UPPER EXTREMITY ARTERIOVENOUS (AV) GORE-TEX GRAFT (Left ) - PERIPHERAL NERVE BLOCK   Anesthesia type: Monitor Anesthesia Care   Pre-op diagnosis: ESRD   Location: [facility] OR ROOM 12 / [facility] OR   Surgeons: [name], MD      DISCUSSION: Patient is a 45 year old male scheduled for the above procedure. He is s/p first stage left basilic vein transposition on 12/11/19, which later thrombosed. Has a right IJ tunneled catheter for MWF HD.  History includes former smoker, HTN, polycystic kidney diseae, ESRD (started hemodialysis 11/2019), OSA (CPAP), HLD, gout.  - ED visit 02/02/20 for LOC. ED notes reviewed. Provider felt due to hypotension in setting of antihypertensives and hemodialysis. Given IVF. Amlodipine held and PCP follow-up recommended. Head CT negative.   Had cardiology evaluation by Dr. [name] in 11/2019. See CV section for details.  Per [facility], patient to continue ASA. 03/22/20 presurgical COVID-19 test negative. Anesthesia team to evaluate on the day of surgery.   VS: Ht 6' (1.829 m)   BMI 34.18 kg/m   BP Readings from Last 3 Encounters:  03/15/20 131/86  02/16/20 108/75  02/04/20 118/78    PROVIDERS: [name], FNP is PCP. Last visit 02/16/20 following 02/02/20 ED visit.  Madelon Lips, MD is nephrologist Adrian Prows, MD is cardiologist. Last evaluation was during 11/2019  LABS: Last lab results include: Lab Results  Component Value Date   WBC 11.2 (H) 02/02/2020   HGB 11.9 (L) 02/02/2020   HCT 38.0 (L) 02/02/2020   PLT 295 02/02/2020   GLUCOSE 115 (H) 02/02/2020   ALT 22 01/19/2020   AST 14 01/19/2020   NA 136 02/02/2020   K 5.3 (H) 02/02/2020   CL 97 (L) 02/02/2020   CREATININE 10.51 (H) 02/02/2020   BUN 60 (H) 02/02/2020   CO2 22 02/02/2020   INR 1.3 (H) 12/08/2019   HGBA1C 5.6 11/24/2019      IMAGES: CXR 02/02/20: FINDINGS: Right  internal jugular approach hemodialysis catheter terminates at the level of the right atrium. The heart size and mediastinal contours are stable. Low lung volumes. No focal airspace consolidation, pleural effusion, or pneumothorax. The visualized skeletal structures are unremarkable. IMPRESSION: Low lung volumes without acute cardiopulmonary process.  CT head 02/02/20: IMPRESSION: - No evidence of acute intracranial abnormality. - Stable mild symmetric frontal lobe atrophy.   EKG: 02/02/20: NSR, LAD, inferior T wave abnormality, consider ischemia.    CV: Left Heart Catheterization 12/09/19 [name], Ulice Dash, MD):  Normal coronary arteries, tortuous vessels suggestive of hypertension with hypertensive heart disease.  Hyperdynamic LVEF at 70%.  Moderately elevated LVEDP.  30 mL contrast utilized.  Right femoral arterial access closed with Perclose. - Recommendation: Evaluation for noncardiac chest pain, chest pain probably related to hypertension and also musculoskeletal chest pain.  Abnormal EKG related to hypertension. - Normal coronary arteries. Tortuous and suggests hypertensive heart disease. LVEF 70%   Nuclear stress test 12/02/19: IMPRESSION: 1. Moderate size region of reversible ischemia in the mid segment lateral wall. 2. Global hypokinesia and LEFT ventricular dilatation. 3. Left ventricular ejection fraction 42% 4. Non invasive risk stratification*: Intermediate   Echo 11/30/19: IMPRESSIONS  1. Left ventricular ejection fraction, by estimation, is 60 to 65%. The  left ventricle has normal function. The left ventricle has no regional  wall motion abnormalities. There is severe left ventricular wall  thickening. Wall thickness is 2.5 cm average,  concerning for infiltrative cardiomyopathy such as amyloidosis. Impaired  diastolic function with indeterminate LV filling pressure.  2. The aortic valve is tricuspid. Aortic valve regurgitation is not  visualized.  3. The  pericardial effusion is localized near the right ventricle and  circumferential.  4. The inferior vena cava is dilated in size with >50% respiratory  variability, suggesting right atrial pressure of 8 mmHg.  5. Right ventricular systolic function is normal. The right ventricular  size is normal. There is normal pulmonary artery systolic pressure. The  estimated right ventricular systolic pressure is 45.8 mmHg.  6. The mitral valve is grossly normal. Trivial mitral valve  regurgitation.   Conclusion(s)/Recommendation(s): Consider further work-up for infiltrative  cardiomyopathy or less likely HCM with cMRI or PYP scan.   Past Medical History:  Diagnosis Date  . CKD (chronic kidney disease)   . Gout   . History of renal dialysis   . HLD (hyperlipidemia)   . Hypertension   . Polycystic kidney disease   . Sleep apnea 07/29/2018  . Vitamin D deficiency 11/2018    Past Surgical History:  Procedure Laterality Date  . BASCILIC VEIN TRANSPOSITION Left 12/11/2019   Procedure: 1ST STAGE BASILIC TRANSPOSITION OF LEFT ARM;  Surgeon: Angelia Mould, MD;  Location: [facility];  Service: Vascular;  Laterality: Left;  . IR FLUORO GUIDE CV LINE RIGHT  12/09/2019  . IR US GUIDE VASC ACCESS RIGHT  12/09/2019  . LEFT HEART CATH AND CORONARY ANGIOGRAPHY N/A 12/09/2019   Procedure: LEFT HEART CATH AND CORONARY ANGIOGRAPHY;  Surgeon: Adrian Prows, MD;  Location: [facility];  Service: Cardiovascular;  Laterality: N/A;    MEDICATIONS: No current facility-administered medications for this encounter.   Marland Kitchen acetaminophen (TYLENOL) 650 MG CR tablet  . acetaminophen-codeine (TYLENOL #3) 300-30 MG tablet  . allopurinol (ZYLOPRIM) 100 MG tablet  . aspirin EC 81 MG EC tablet  . camphor-menthol (SARNA) lotion  . carvedilol (COREG) 3.125 MG tablet  . docusate sodium (COLACE) 100 MG capsule  . multivitamin (ONE-A-DAY MEN'S) TABS tablet  . nitroGLYCERIN (NITROSTAT) 0.4 MG SL tablet  . ondansetron  (ZOFRAN) 4 MG tablet  . oxycodone (OXY-IR) 5 MG capsule  . rosuvastatin (CRESTOR) 10 MG tablet  . sevelamer carbonate (RENVELA) 800 MG tablet  . calcitRIOL (ROCALTROL) 0.25 MCG capsule  . isosorbide dinitrate (ISORDIL) 10 MG tablet    Myra Gianotti, PA-C Surgical Short Stay/Anesthesiology [facility] Phone [phone] [facility] Phone [phone] 03/23/2020 2:58 PM

## 2020-03-24 ENCOUNTER — Encounter (HOSPITAL_COMMUNITY)
Admission: RE | Disposition: A | Discharge status: Home / Self Care | Payer: Self-pay | Source: Home / Self Care | Attending: Vascular Surgery

## 2020-03-24 ENCOUNTER — Ambulatory Visit (HOSPITAL_COMMUNITY): Payer: Medicare Other | Admitting: Vascular Surgery

## 2020-03-24 ENCOUNTER — Ambulatory Visit (HOSPITAL_COMMUNITY)
Admission: RE | Admit: 2020-03-24 | Discharge: 2020-03-24 | Disposition: A | Discharge status: Home / Self Care | Payer: Medicare Other | Attending: Vascular Surgery | Admitting: Vascular Surgery

## 2020-03-24 ENCOUNTER — Encounter (HOSPITAL_COMMUNITY): Payer: Self-pay

## 2020-03-24 ENCOUNTER — Other Ambulatory Visit: Payer: Self-pay

## 2020-03-24 ENCOUNTER — Other Ambulatory Visit (HOSPITAL_COMMUNITY): Payer: Self-pay | Admitting: Physician Assistant

## 2020-03-24 DIAGNOSIS — N185 Chronic kidney disease, stage 5: Secondary | ICD-10-CM | POA: Diagnosis not present

## 2020-03-24 DIAGNOSIS — Z992 Dependence on renal dialysis: Secondary | ICD-10-CM | POA: Diagnosis not present

## 2020-03-24 DIAGNOSIS — Z7982 Long term (current) use of aspirin: Secondary | ICD-10-CM | POA: Insufficient documentation

## 2020-03-24 DIAGNOSIS — Y832 Surgical operation with anastomosis, bypass or graft as the cause of abnormal reaction of the patient, or of later complication, without mention of misadventure at the time of the procedure: Secondary | ICD-10-CM | POA: Diagnosis not present

## 2020-03-24 DIAGNOSIS — N186 End stage renal disease: Secondary | ICD-10-CM | POA: Insufficient documentation

## 2020-03-24 DIAGNOSIS — I120 Hypertensive chronic kidney disease with stage 5 chronic kidney disease or end stage renal disease: Secondary | ICD-10-CM | POA: Insufficient documentation

## 2020-03-24 DIAGNOSIS — T82868A Thrombosis of vascular prosthetic devices, implants and grafts, initial encounter: Secondary | ICD-10-CM | POA: Diagnosis not present

## 2020-03-24 DIAGNOSIS — Z87891 Personal history of nicotine dependence: Secondary | ICD-10-CM | POA: Insufficient documentation

## 2020-03-24 DIAGNOSIS — Z79899 Other long term (current) drug therapy: Secondary | ICD-10-CM | POA: Diagnosis not present

## 2020-03-24 DIAGNOSIS — M109 Gout, unspecified: Secondary | ICD-10-CM | POA: Diagnosis not present

## 2020-03-24 DIAGNOSIS — Q613 Polycystic kidney, unspecified: Secondary | ICD-10-CM | POA: Diagnosis not present

## 2020-03-24 HISTORY — DX: Hyperlipidemia, unspecified: E78.5

## 2020-03-24 HISTORY — DX: Gout, unspecified: M10.9

## 2020-03-24 HISTORY — DX: Pneumonia, unspecified organism: J18.9

## 2020-03-24 HISTORY — PX: AV FISTULA PLACEMENT: SHX1204

## 2020-03-24 LAB — POCT I-STAT, CHEM 8
BUN: 50 mg/dL — ABNORMAL HIGH (ref 6–20)
Calcium, Ion: 0.83 mmol/L — CL (ref 1.15–1.40)
Chloride: 102 mmol/L (ref 98–111)
Creatinine, Ser: 11.3 mg/dL — ABNORMAL HIGH (ref 0.61–1.24)
Glucose, Bld: 92 mg/dL (ref 70–99)
HCT: 44 % (ref 39.0–52.0)
Hemoglobin: 15 g/dL (ref 13.0–17.0)
Potassium: 4.5 mmol/L (ref 3.5–5.1)
Sodium: 138 mmol/L (ref 135–145)
TCO2: 25 mmol/L (ref 22–32)

## 2020-03-24 LAB — GLUCOSE, CAPILLARY: Glucose-Capillary: 81 mg/dL (ref 70–99)

## 2020-03-24 SURGERY — INSERTION OF ARTERIOVENOUS (AV) GORE-TEX GRAFT ARM
Anesthesia: Monitor Anesthesia Care | Site: Arm Upper | Laterality: Left

## 2020-03-24 MED ORDER — 0.9 % SODIUM CHLORIDE (POUR BTL) OPTIME
TOPICAL | Status: DC | PRN
  Administered 2020-03-24: 1000 mL

## 2020-03-24 MED ORDER — MIDAZOLAM HCL 2 MG/2ML IJ SOLN
INTRAMUSCULAR | Status: AC
  Administered 2020-03-24: 2 mg via INTRAVENOUS
  Filled 2020-03-24: qty 2

## 2020-03-24 MED ORDER — CHLORHEXIDINE GLUCONATE 4 % EX LIQD: 60.0000 mL | Freq: Once | CUTANEOUS | Status: DC

## 2020-03-24 MED ORDER — CARVEDILOL 12.5 MG PO TABS
ORAL_TABLET | ORAL | Status: AC
  Administered 2020-03-24: 12.5 mg via ORAL
  Filled 2020-03-24: qty 1

## 2020-03-24 MED ORDER — HEPARIN SODIUM (PORCINE) 1000 UNIT/ML IJ SOLN
INTRAMUSCULAR | Status: DC | PRN
  Administered 2020-03-24: 5000 [IU] via INTRAVENOUS

## 2020-03-24 MED ORDER — CEFAZOLIN SODIUM-DEXTROSE 2-4 GM/100ML-% IV SOLN
2.0000 g | INTRAVENOUS | Status: AC
  Administered 2020-03-24: 2 g via INTRAVENOUS
  Filled 2020-03-24: qty 100

## 2020-03-24 MED ORDER — PHENYLEPHRINE 40 MCG/ML (10ML) SYRINGE FOR IV PUSH (FOR BLOOD PRESSURE SUPPORT)
PREFILLED_SYRINGE | INTRAVENOUS | Status: DC | PRN
  Administered 2020-03-24: 80 ug via INTRAVENOUS
  Administered 2020-03-24: 120 ug via INTRAVENOUS
  Administered 2020-03-24: 80 ug via INTRAVENOUS
  Administered 2020-03-24: 120 ug via INTRAVENOUS

## 2020-03-24 MED ORDER — FENTANYL CITRATE (PF) 250 MCG/5ML IJ SOLN
INTRAMUSCULAR | Status: AC
  Filled 2020-03-24: qty 5

## 2020-03-24 MED ORDER — PHENYLEPHRINE 40 MCG/ML (10ML) SYRINGE FOR IV PUSH (FOR BLOOD PRESSURE SUPPORT)
PREFILLED_SYRINGE | INTRAVENOUS | Status: AC
  Filled 2020-03-24: qty 10

## 2020-03-24 MED ORDER — FENTANYL CITRATE (PF) 100 MCG/2ML IJ SOLN
INTRAMUSCULAR | Status: AC
  Administered 2020-03-24: 100 ug via INTRAVENOUS
  Filled 2020-03-24: qty 2

## 2020-03-24 MED ORDER — CHLORHEXIDINE GLUCONATE 0.12 % MT SOLN: 15.0000 mL | Freq: Once | OROMUCOSAL | Status: AC

## 2020-03-24 MED ORDER — MIDODRINE HCL 10 MG PO TABS: 5.0000 mg | ORAL_TABLET | Freq: Three times a day (TID) | ORAL | 1 refills | Status: DC

## 2020-03-24 MED ORDER — PHENYLEPHRINE HCL-NACL 10-0.9 MG/250ML-% IV SOLN
INTRAVENOUS | Status: DC | PRN
  Administered 2020-03-24: 25 ug/min via INTRAVENOUS

## 2020-03-24 MED ORDER — LIDOCAINE-EPINEPHRINE (PF) 1 %-1:200000 IJ SOLN
INTRAMUSCULAR | Status: DC | PRN
  Administered 2020-03-24: 10 mL

## 2020-03-24 MED ORDER — BUPIVACAINE HCL (PF) 0.5 % IJ SOLN
INTRAMUSCULAR | Status: DC | PRN
  Administered 2020-03-24: 20 mL via PERINEURAL

## 2020-03-24 MED ORDER — EPHEDRINE 5 MG/ML INJ
INTRAVENOUS | Status: AC
  Filled 2020-03-24: qty 10

## 2020-03-24 MED ORDER — ALBUMIN HUMAN 5 % IV SOLN: INTRAVENOUS | Status: DC | PRN

## 2020-03-24 MED ORDER — PROPOFOL 500 MG/50ML IV EMUL
INTRAVENOUS | Status: DC | PRN
  Administered 2020-03-24: 50 ug/kg/min via INTRAVENOUS

## 2020-03-24 MED ORDER — MIDAZOLAM HCL 5 MG/5ML IJ SOLN
INTRAMUSCULAR | Status: DC | PRN
  Administered 2020-03-24 (×2): 1 mg via INTRAVENOUS

## 2020-03-24 MED ORDER — ONDANSETRON HCL 4 MG/2ML IJ SOLN
INTRAMUSCULAR | Status: AC
  Filled 2020-03-24: qty 2

## 2020-03-24 MED ORDER — FENTANYL CITRATE (PF) 100 MCG/2ML IJ SOLN: 100.0000 ug | Freq: Once | INTRAMUSCULAR | Status: AC

## 2020-03-24 MED ORDER — HEPARIN SODIUM (PORCINE) 1000 UNIT/ML IJ SOLN
INTRAMUSCULAR | Status: AC
  Filled 2020-03-24: qty 1

## 2020-03-24 MED ORDER — PROPOFOL 10 MG/ML IV BOLUS
INTRAVENOUS | Status: AC
  Filled 2020-03-24: qty 20

## 2020-03-24 MED ORDER — HEMOSTATIC AGENTS (NO CHARGE) OPTIME
TOPICAL | Status: DC | PRN
  Administered 2020-03-24: 1 via TOPICAL

## 2020-03-24 MED ORDER — FENTANYL CITRATE (PF) 100 MCG/2ML IJ SOLN
INTRAMUSCULAR | Status: DC | PRN
  Administered 2020-03-24: 50 ug via INTRAVENOUS

## 2020-03-24 MED ORDER — EPHEDRINE SULFATE-NACL 50-0.9 MG/10ML-% IV SOSY
PREFILLED_SYRINGE | INTRAVENOUS | Status: DC | PRN
  Administered 2020-03-24 (×2): 10 mg via INTRAVENOUS

## 2020-03-24 MED ORDER — OXYCODONE HCL 5 MG PO CAPS: 5.0000 mg | ORAL_CAPSULE | Freq: Four times a day (QID) | ORAL | 0 refills | Status: DC | PRN

## 2020-03-24 MED ORDER — CHLORHEXIDINE GLUCONATE 0.12 % MT SOLN
OROMUCOSAL | Status: AC
  Administered 2020-03-24: 15 mL via OROMUCOSAL
  Filled 2020-03-24: qty 15

## 2020-03-24 MED ORDER — SODIUM CHLORIDE 0.9 % IV SOLN: INTRAVENOUS | Status: DC | PRN

## 2020-03-24 MED ORDER — MIDAZOLAM HCL 2 MG/2ML IJ SOLN: 2.0000 mg | Freq: Once | INTRAMUSCULAR | Status: AC

## 2020-03-24 MED ORDER — CARVEDILOL 12.5 MG PO TABS: 12.5000 mg | ORAL_TABLET | ORAL | Status: AC

## 2020-03-24 MED ORDER — SODIUM CHLORIDE 0.9 % IV SOLN
INTRAVENOUS | Status: DC | PRN
  Administered 2020-03-24: 500 mL

## 2020-03-24 MED ORDER — SODIUM CHLORIDE 0.9 % IV SOLN
INTRAVENOUS | Status: AC
  Filled 2020-03-24: qty 1.2

## 2020-03-24 MED ORDER — LIDOCAINE-EPINEPHRINE (PF) 1 %-1:200000 IJ SOLN
INTRAMUSCULAR | Status: AC
  Filled 2020-03-24: qty 30

## 2020-03-24 MED ORDER — ONDANSETRON HCL 4 MG/2ML IJ SOLN
INTRAMUSCULAR | Status: DC | PRN
  Administered 2020-03-24: 4 mg via INTRAVENOUS

## 2020-03-24 MED ORDER — MIDAZOLAM HCL 2 MG/2ML IJ SOLN
INTRAMUSCULAR | Status: AC
  Filled 2020-03-24: qty 2

## 2020-03-24 MED ORDER — SODIUM CHLORIDE 0.9 % IV SOLN
INTRAVENOUS | Status: DC
  Administered 2020-03-24: 10 mL/h via INTRAVENOUS

## 2020-03-24 MED ORDER — LIDOCAINE 2% (20 MG/ML) 5 ML SYRINGE
INTRAMUSCULAR | Status: AC
  Filled 2020-03-24: qty 5

## 2020-03-24 MED FILL — oxyCODONE HCL 5 MG TABS: 5 | 3 days supply | Qty: 10 | Fill #0

## 2020-03-24 MED FILL — MIDODRINE HCL 5 MG TABS: 5 | 30 days supply | Qty: 90 | Fill #0

## 2020-03-24 SURGICAL SUPPLY — 42 items
ARMBAND PINK RESTRICT EXTREMIT (MISCELLANEOUS) ×4 IMPLANT
BENZOIN TINCTURE PRP APPL 2/3 (GAUZE/BANDAGES/DRESSINGS) IMPLANT
CANISTER SUCT 3000ML PPV (MISCELLANEOUS) ×2 IMPLANT
CANNULA VESSEL 3MM 2 BLNT TIP (CANNULA) ×2 IMPLANT
CHLORAPREP W/TINT 26 (MISCELLANEOUS) ×2 IMPLANT
CLIP VESOCCLUDE MED 6/CT (CLIP) ×2 IMPLANT
CLIP VESOCCLUDE SM WIDE 6/CT (CLIP) ×2 IMPLANT
COVER WAND RF STERILE (DRAPES) IMPLANT
DERMABOND ADVANCED (GAUZE/BANDAGES/DRESSINGS) ×1
DERMABOND ADVANCED .7 DNX12 (GAUZE/BANDAGES/DRESSINGS) ×1 IMPLANT
ELECT REM PT RETURN 9FT ADLT (ELECTROSURGICAL) ×2
ELECTRODE REM PT RTRN 9FT ADLT (ELECTROSURGICAL) ×1 IMPLANT
GLOVE BIO SURGEON STRL SZ8 (GLOVE) ×2 IMPLANT
GOWN STRL REUS W/ TWL LRG LVL3 (GOWN DISPOSABLE) ×3 IMPLANT
GOWN STRL REUS W/ TWL XL LVL3 (GOWN DISPOSABLE) ×1 IMPLANT
GOWN STRL REUS W/TWL LRG LVL3 (GOWN DISPOSABLE) ×3
GOWN STRL REUS W/TWL XL LVL3 (GOWN DISPOSABLE) ×1
GRAFT GORETEX STRT 4-7X45 (Vascular Products) ×2 IMPLANT
HEMOSTAT SNOW SURGICEL 2X4 (HEMOSTASIS) IMPLANT
HEMOSTAT SURGICEL 2X14 (HEMOSTASIS) ×2 IMPLANT
INSERT FOGARTY SM (MISCELLANEOUS) ×2 IMPLANT
KIT BASIN OR (CUSTOM PROCEDURE TRAY) ×2 IMPLANT
KIT TURNOVER KIT B (KITS) ×2 IMPLANT
LOOP VESSEL MINI RED (MISCELLANEOUS) ×2 IMPLANT
NS IRRIG 1000ML POUR BTL (IV SOLUTION) ×2 IMPLANT
PACK CV ACCESS (CUSTOM PROCEDURE TRAY) ×2 IMPLANT
PAD ARMBOARD 7.5X6 YLW CONV (MISCELLANEOUS) ×4 IMPLANT
PENCIL SMOKE EVACUATOR COATED (MISCELLANEOUS) ×2 IMPLANT
STRIP CLOSURE SKIN 1/2X4 (GAUZE/BANDAGES/DRESSINGS) IMPLANT
SUT GORETEX 6.0 TT9 (SUTURE) IMPLANT
SUT MNCRL AB 4-0 PS2 18 (SUTURE) ×4 IMPLANT
SUT PROLENE 6 0 BV (SUTURE) ×4 IMPLANT
SUT SILK 2 0 (SUTURE) ×1
SUT SILK 2 0 SH (SUTURE) ×2 IMPLANT
SUT SILK 2-0 18XBRD TIE 12 (SUTURE) ×1 IMPLANT
SUT VIC AB 3-0 SH 27 (SUTURE) ×2
SUT VIC AB 3-0 SH 27X BRD (SUTURE) ×2 IMPLANT
SYR CONTROL 10ML LL (SYRINGE) ×2 IMPLANT
SYR TOOMEY 50ML (SYRINGE) IMPLANT
TOWEL GREEN STERILE (TOWEL DISPOSABLE) ×2 IMPLANT
UNDERPAD 30X36 HEAVY ABSORB (UNDERPADS AND DIAPERS) ×2 IMPLANT
WATER STERILE IRR 1000ML POUR (IV SOLUTION) ×2 IMPLANT

## 2020-03-24 NOTE — Transfer of Care (Signed)
Immediate Anesthesia Transfer of Care Note  Patient: Kaison Wachtel  Procedure(s) Performed: INSERTION OF LEFT UPPER EXTREMITY ARTERIOVENOUS (AV) GORE-TEX GRAFT (Left Arm Upper)  Patient Location: PACU  Anesthesia Type:MAC and Regional  Level of Consciousness: awake, alert  and oriented  Airway & Oxygen Therapy: Patient Spontanous Breathing  Post-op Assessment: Report given to RN and Post -op Vital signs reviewed and stable  Post vital signs: Reviewed and stable  Last Vitals:  Vitals Value Taken Time  BP    Temp    Pulse 76 03/24/20 1140  Resp 25 03/24/20 1140  SpO2 100 % 03/24/20 1140  Vitals shown include unvalidated device data.  Last Pain:  Vitals:   03/24/20 0855  TempSrc:   PainSc: 0-No pain      Patients Stated Pain Goal: 4 (94/44/61 9012)  Complications: No complications documented.

## 2020-03-24 NOTE — Anesthesia Postprocedure Evaluation (Signed)
Anesthesia Post Note  Patient: [name]  Procedure(s) Performed: INSERTION OF LEFT UPPER EXTREMITY ARTERIOVENOUS (AV) GORE-TEX GRAFT (Left Arm Upper)     Patient location during evaluation: PACU Anesthesia Type: Regional and MAC Level of consciousness: awake and alert Pain management: pain level controlled Vital Signs Assessment: post-procedure vital signs reviewed and stable Respiratory status: spontaneous breathing, nonlabored ventilation, respiratory function stable and patient connected to nasal cannula oxygen Cardiovascular status: blood pressure returned to baseline and stable Postop Assessment: no apparent nausea or vomiting Anesthetic complications: no   No complications documented.  Last Vitals:  Vitals:   03/24/20 1200 03/24/20 1215  BP: 119/71 137/61  Pulse: 77 75  Resp: 18 16  Temp:    SpO2: 100% 99%    Last Pain:  Vitals:   03/24/20 1215  TempSrc:   PainSc: 0-No pain                 Antigone Crowell L Jakya Dovidio

## 2020-03-24 NOTE — Anesthesia Preprocedure Evaluation (Addendum)
Anesthesia Evaluation  Patient identified by MRN, date of birth, ID band Patient awake    Reviewed: Allergy & Precautions, NPO status , Patient's Chart, lab work & pertinent test results, reviewed documented beta blocker date and time   Airway Mallampati: II  TM Distance: >3 FB Neck ROM: Full    Dental no notable dental hx. (+) Dental Advisory Given, Teeth Intact   Pulmonary sleep apnea and Continuous Positive Airway Pressure Ventilation , former smoker,    Pulmonary exam normal breath sounds clear to auscultation       Cardiovascular hypertension, Pt. on home beta blockers and Pt. on medications Normal cardiovascular exam Rhythm:Regular Rate:Normal  TTE 2021 1. Left ventricular ejection fraction, by estimation, is 60 to 65%. The left ventricle has normal function. The left ventricle has no regional wall motion abnormalities. There is severe left ventricular wall thickening. Wall thickness is 2.5 cm average, concerning for infiltrative cardiomyopathy such as amyloidosis. Impaired diastolic function with indeterminate LV filling pressure.  2. The aortic valve is tricuspid. Aortic valve regurgitation is not visualized.  3. The pericardial effusion is localized near the right ventricle and circumferential.  4. The inferior vena cava is dilated in size with >50% respiratory variability, suggesting right atrial pressure of 8 mmHg.  5. Right ventricular systolic function is normal. The right ventricular size is normal. There is normal pulmonary artery systolic pressure. The estimated right ventricular systolic pressure is 75.9 mmHg.  6. The mitral valve is grossly normal. Trivial mitral valve  regurgitation.   Left Heart Catheterization 12/09/19:  Normal coronary arteries, tortuous vessels suggestive of hypertension with hypertensive heart disease.  Hyperdynamic LVEF at 70%.  Moderately elevated LVEDP.  30 mL contrast utilized.  Right  femoral arterial access closed with Perclose.    Neuro/Psych negative neurological ROS  negative psych ROS   GI/Hepatic negative GI ROS, Neg liver ROS,   Endo/Other  negative endocrine ROS  Renal/GU Dialysis and ESRFRenal disease (diaylsis M W F, had dialysis yesterday)negative Renal ROS  negative genitourinary   Musculoskeletal  (+) Arthritis ,   Abdominal   Peds  Hematology negative hematology ROS (+)   Anesthesia Other Findings   Reproductive/Obstetrics                        Anesthesia Physical Anesthesia Plan  ASA: III  Anesthesia Plan: MAC and Regional   Post-op Pain Management:  Regional for Post-op pain   Induction: Intravenous  PONV Risk Score and Plan: 1 and Propofol infusion, Treatment may vary due to age or medical condition, Midazolam and Ondansetron  Airway Management Planned: Natural Airway  Additional Equipment:   Intra-op Plan:   Post-operative Plan:   Informed Consent: I have reviewed the patients History and Physical, chart, labs and discussed the procedure including the risks, benefits and alternatives for the proposed anesthesia with the patient or authorized representative who has indicated his/her understanding and acceptance.     Dental advisory given  Plan Discussed with: CRNA  Anesthesia Plan Comments:         Anesthesia Quick Evaluation

## 2020-03-24 NOTE — Progress Notes (Signed)
Orthopedic Tech Progress Note Patient Details:  Michael [name] [birth year] [id number] Left arm sling with RN Ortho Devices Type of Ortho Device: Arm sling Ortho Device/Splint Interventions: Ordered       Taelyn Broecker A Crisanto Nied 03/24/2020, 12:20 PM

## 2020-03-24 NOTE — Discharge Instructions (Signed)
° °  [facility] ° °Discharge Instructions ° °AV Fistula or Graft Surgery for Dialysis Access ° °Please refer to the following instructions for your post-procedure care. Your surgeon or physician assistant will discuss any changes with you. ° °Activity ° °You may drive the day following your surgery, if you are comfortable and no longer taking prescription pain medication. Resume full activity as the soreness in your incision resolves. ° °Bathing/Showering ° °You may shower after you go home. Keep your incision dry for 48 hours. Do not soak in a bathtub, hot tub, or swim until the incision heals completely. You may not shower if you have a hemodialysis catheter. ° °Incision Care ° °Clean your incision with mild soap and water after 48 hours. Pat the area dry with a clean towel. You do not need a bandage unless otherwise instructed. Do not apply any ointments or creams to your incision. You may have skin glue on your incision. Do not peel it off. It will come off on its own in about one week. Your arm may swell a bit after surgery. To reduce swelling use pillows to elevate your arm so it is above your heart. Your doctor will tell you if you need to lightly wrap your arm with an ACE bandage. ° °Diet ° °Resume your normal diet. There are not special food restrictions following this procedure. In order to heal from your surgery, it is CRITICAL to get adequate nutrition. Your body requires vitamins, minerals, and protein. Vegetables are the best source of vitamins and minerals. Vegetables also provide the perfect balance of protein. Processed food has little nutritional value, so try to avoid this. ° °Medications ° °Resume taking all of your medications. If your incision is causing pain, you may take over-the counter pain relievers such as acetaminophen (Tylenol). If you were prescribed a stronger pain medication, please be aware these medications can cause nausea and constipation. Prevent  nausea by taking the medication with a snack or meal. Avoid constipation by drinking plenty of fluids and eating foods with high amount of fiber, such as fruits, vegetables, and grains. Do not take Tylenol if you are taking prescription pain medications. ° ° ° ° °Follow up °Your surgeon may want to see you in the office following your access surgery. If so, this will be arranged at the time of your surgery. ° °Please call us immediately for any of the following conditions: ° °Increased pain, redness, drainage (pus) from your incision site °Fever of 101 degrees or higher °Severe or worsening pain at your incision site °Hand pain or numbness. ° °Reduce your risk of vascular disease: ° °Stop smoking. If you would like help, call [facility] at 1-800-QUIT-NOW (1-800-784-8669) or [place] at 336-586-4000 ° °Manage your cholesterol °Maintain a desired weight °Control your diabetes °Keep your blood pressure down ° °Dialysis ° °It will take several weeks to several months for your new dialysis access to be ready for use. Your surgeon will determine when it is OK to use it. Your nephrologist will continue to direct your dialysis. You can continue to use your Permcath until your new access is ready for use. ° °If you have any questions, please call the office at [phone]. ° °

## 2020-03-24 NOTE — Op Note (Signed)
DATE OF SERVICE: 03/24/2020  PATIENT:  Michael [name]  45 y.o. male  PRE-OPERATIVE DIAGNOSIS:  ESRD  POST-OPERATIVE DIAGNOSIS:  ESRD  PROCEDURE:   Left upper extremity brachial artery - axillary vein arteriovenous graft with 4-73m tapered Gore-Tex  SURGEON:  Surgeon(s) and Role:    * HCherre Robins MD - Primary  ASSISTANT: MArlee Muslim PA-C  An assistant was required to facilitate exposure and expedite the case.  ANESTHESIA:   regional and MAC  EBL: min  BLOOD ADMINISTERED:none  DRAINS: none   LOCAL MEDICATIONS USED:  LIDOCAINE   SPECIMEN:  none  COUNTS: confirmed correct.  TOURNIQUET:  * No tourniquets in log *  PATIENT DISPOSITION:  PACU - hemodynamically stable.   Delay start of Pharmacological VTE agent (>24hrs) due to surgical blood loss or risk of bleeding: no  INDICATION FOR PROCEDURE: [name] a 45y.o. male with ESRD dialyzing via RIJ TDC. He is in need of permanent HD access. He has thrombosed a LUE first stage basilic vein AVF. After careful discussion of risks, benefits, and alternatives the patient was offered LUE AVG. We specifically discussed risk of steal. The patient understood and wished to proceed.  OPERATIVE FINDINGS: healthy vessels. Vessels in spasm after manipulation. Radial doppler flow on completion. Audible bruit at axillary vein. Hypotensive throughout case. May need midodrine to keep BP normal through HD.  DESCRIPTION OF PROCEDURE: After identification of the patient in the pre-operative holding area, the patient was transferred to the operating room. The patient was positioned supine on the operating room table. Anesthesia was induced. The left upper extremity was prepped and draped in standard fashion. A surgical pause was performed confirming correct patient, procedure, and operative location.  The left brachial artery was exposed using a longitudinal incision in the distal arm just above the antecubital fossa.  Incision was carried  down through subcutaneous tissue until the brachial sheath was encountered.  This was incised sharply.  The brachial artery was exposed and encircled with Silastic Vesseloops proximally distally.   The left axillary vein axilla was exposed using longitudinal incision just below the hairbearing area.  Incision was carried down to the brachial sheath was encountered.  The brachial vein was identified, exposed, encircled with Silastic Vesseloops.   Using a curved, sheathed tunneling device, a 4-7 mm tapered Gore-Tex graft was tunneled subcutaneously and gentle arc across the biceps of the left arm.  Patient was then heparinized with 5000 units of IV heparin.   The brachial artery was clamped proximally distally.  An anterior arteriotomy was made with 11 blade.  This was extended with Potts scissors.  The 4 mm end of the Gore-Tex graft was then anastomosed end-to-side to the brachial arteriotomy using continuous running suture of 6-0 Prolene.  The anastomosis was completed and hemostasis ensured.  The graft was clamped to restore perfusion to the hand.   The brachial vein was clamped proximally distally.  A venotomy was made with an 11 blade.  This was extended with Potts scissors.  The 7 mm end of the Gore-Tex graft was then anastomosed end to side to the brachial vein venotomy using continuous running suture of 6-0 Prolene.  Millie prior to completion the anastomosis was de-aired and flushed.  Anastomosis was then completed.  Hemostasis was insured.   Doppler machine was brought onto the field to interrogate the graft.  Doppler flow was noted in the radial artery.  About the arterial anastomosis flow was noted proximal and distal to the arterial  anastomosis.  Distal to the venous anastomosis a Doppler bruit was heard.  Satisfied we ended the case here.   Surgical beds were irrigated copiously.  Hemostasis was again ensured in the surgical beds.  The wounds were closed in layers using 3-0 Vicryl and 4-0  Monocryl.  Clean bandages were applied.  Upon completion of the case instrument and sharps counts were confirmed correct. The patient was transferred to the PACU in good condition. I was present for all portions of the procedure.  Yevonne Aline. Stanford Breed, MD [facility] Phone Number: [phone] 03/24/2020 11:10 AM

## 2020-03-24 NOTE — Anesthesia Procedure Notes (Signed)
Procedure Name: MAC Date/Time: 03/24/2020 9:25 AM Performed by: Harden Mo, CRNA Pre-anesthesia Checklist: Patient identified, Emergency Drugs available, Suction available and Patient being monitored Patient Re-evaluated:Patient Re-evaluated prior to induction Oxygen Delivery Method: Simple face mask Preoxygenation: Pre-oxygenation with 100% oxygen Induction Type: IV induction Placement Confirmation: positive ETCO2 and breath sounds checked- equal and bilateral Dental Injury: Teeth and Oropharynx as per pre-operative assessment

## 2020-03-24 NOTE — Interval H&P Note (Signed)
History and Physical Interval Note:  03/24/2020 7:02 AM  Michael [name]  has presented today for surgery, with the diagnosis of ESRD.  The various methods of treatment have been discussed with the patient and family. After consideration of risks, benefits and other options for treatment, the patient has consented to  Procedure(s) with comments: INSERTION OF LEFT UPPER EXTREMITY ARTERIOVENOUS (AV) GORE-TEX GRAFT (Left) - PERIPHERAL NERVE BLOCK as a surgical intervention.  The patient's history has been reviewed, patient examined, no change in status, stable for surgery.  I have reviewed the patient's chart and labs.  Questions were answered to the patient's satisfaction.     [name]

## 2020-03-24 NOTE — Anesthesia Procedure Notes (Signed)
Anesthesia Regional Block: Supraclavicular block   Pre-Anesthetic Checklist: ,, timeout performed, Correct Patient, Correct Site, Correct Laterality, Correct Procedure, Correct Position, site marked, Risks and benefits discussed,  Surgical consent,  Pre-op evaluation,  At surgeon's request and post-op pain management  Laterality: Left  Prep: Maximum Sterile Barrier Precautions used, chloraprep       Needles:  Injection technique: Single-shot  Needle Type: Echogenic Stimulator Needle     Needle Length: 4cm  Needle Gauge: 22     Additional Needles:   Procedures:,,,, ultrasound used (permanent image in chart),,,,  Narrative:  Start time: 03/24/2020 8:52 AM End time: 03/24/2020 9:02 AM Injection made incrementally with aspirations every 5 mL.  Performed by: Personally  Anesthesiologist: Freddrick March, MD  Additional Notes: Monitors applied. No increased pain on injection. No increased resistance to injection. Injection made in 5cc increments. Good needle visualization. Patient tolerated procedure well.

## 2020-03-25 ENCOUNTER — Telehealth: Payer: Self-pay

## 2020-03-25 ENCOUNTER — Encounter (HOSPITAL_COMMUNITY): Payer: Self-pay | Admitting: Vascular Surgery

## 2020-03-25 NOTE — Telephone Encounter (Signed)
Patient called c/o 10/10 pain in surgical site. When asked to describe quality it was "everything - I feel like I've been cut by a knife." He advised he has been taking the oxycodone 5 2 q3hr and it has not touched the pain. Advised patient that he should go to the ED.

## 2020-03-25 NOTE — Telephone Encounter (Signed)
Joycelyn Schmid called to let us know pt is there for HD today and c/o inadequate pain control. She stated pt is angry and told her he feels discriminated against due to being given oxycodone and not something else. I encouraged her to advise him to go to ED if he feels pain medication is not controlling pain. Joycelyn Schmid stated pt may try to contact his sickle cell doctor for something stronger to take.

## 2020-03-26 ENCOUNTER — Other Ambulatory Visit: Payer: Self-pay | Admitting: Vascular Surgery

## 2020-03-26 MED ORDER — OXYCODONE HCL 5 MG PO CAPS: 5.0000 mg | ORAL_CAPSULE | ORAL | 0 refills | Status: DC | PRN

## 2020-03-26 MED ORDER — OXYCODONE-ACETAMINOPHEN 5-325 MG PO TABS: 1.0000 | ORAL_TABLET | ORAL | 0 refills | Status: DC | PRN

## 2020-03-28 MED FILL — ALLOPURINOL 100 MG TABLET: 100 | 30 days supply | Qty: 60 | Fill #0

## 2020-04-05 ENCOUNTER — Ambulatory Visit: Payer: Self-pay | Admitting: Family Medicine

## 2020-04-05 ENCOUNTER — Other Ambulatory Visit: Payer: Self-pay

## 2020-04-05 DIAGNOSIS — N186 End stage renal disease: Secondary | ICD-10-CM

## 2020-04-19 ENCOUNTER — Other Ambulatory Visit: Payer: Self-pay

## 2020-04-19 ENCOUNTER — Encounter: Payer: Self-pay | Admitting: Family Medicine

## 2020-04-19 ENCOUNTER — Ambulatory Visit (INDEPENDENT_AMBULATORY_CARE_PROVIDER_SITE_OTHER): Payer: Medicare Other | Admitting: Family Medicine

## 2020-04-19 VITALS — BP 148/82 | HR 66 | Temp 99.0°F | Ht 73.0 in | Wt 255.4 lb

## 2020-04-19 DIAGNOSIS — I10 Essential (primary) hypertension: Secondary | ICD-10-CM

## 2020-04-26 ENCOUNTER — Encounter (HOSPITAL_COMMUNITY): Payer: Medicare Other

## 2020-04-28 NOTE — Progress Notes (Signed)
Patient left prior to being seen.   [name]  APRN, MSN, FNP-C [facility] [address] [place], [place] [address] [phone]

## 2020-05-02 ENCOUNTER — Other Ambulatory Visit: Payer: Self-pay

## 2020-05-02 DIAGNOSIS — Z992 Dependence on renal dialysis: Secondary | ICD-10-CM

## 2020-05-02 DIAGNOSIS — N186 End stage renal disease: Secondary | ICD-10-CM

## 2020-05-17 HISTORY — PX: DG AV DIALYSIS GRAFT DECLOT OR: HXRAD813

## 2020-05-19 ENCOUNTER — Inpatient Hospital Stay (HOSPITAL_COMMUNITY): Admission: RE | Admit: 2020-05-19 | Payer: Medicare Other | Source: Ambulatory Visit

## 2020-05-23 ENCOUNTER — Other Ambulatory Visit: Payer: Self-pay

## 2020-05-26 ENCOUNTER — Ambulatory Visit (HOSPITAL_COMMUNITY)
Admission: RE | Admit: 2020-05-26 | Discharge: 2020-05-26 | Disposition: A | Discharge status: Home / Self Care | Payer: 59 | Source: Ambulatory Visit | Attending: Vascular Surgery | Admitting: Vascular Surgery

## 2020-05-26 ENCOUNTER — Other Ambulatory Visit: Payer: Self-pay

## 2020-05-26 ENCOUNTER — Ambulatory Visit (INDEPENDENT_AMBULATORY_CARE_PROVIDER_SITE_OTHER): Payer: 59 | Admitting: Physician Assistant

## 2020-05-26 VITALS — BP 149/90 | HR 94 | Temp 98.6°F | Resp 20 | Ht 73.0 in | Wt 255.7 lb

## 2020-05-26 DIAGNOSIS — Z992 Dependence on renal dialysis: Secondary | ICD-10-CM

## 2020-05-26 DIAGNOSIS — N186 End stage renal disease: Secondary | ICD-10-CM | POA: Insufficient documentation

## 2020-05-26 NOTE — Progress Notes (Signed)
POST OPERATIVE OFFICE NOTE    CC:  F/u for surgery  HPI:  This is a 45 y.o. male who is s/p LUE AVG on 03/24/2020 by Dr. Stanford Breed.   He has a TDC that was placed by IR in September 2021.    Pt states he does not have pain/numbness in the left hand.  He does have some numbness on the medial side of the left arm.  He states they are using his graft and it is working well.  His catheter was removed recently.   The pt is on dialysis M/W/F at [facility] location.   No Known Allergies  Current Outpatient Medications  Medication Sig Dispense Refill  . acetaminophen (TYLENOL) 650 MG CR tablet Take 650 mg by mouth daily.    Marland Kitchen acetaminophen-codeine (TYLENOL #3) 300-30 MG tablet Take 1 tablet by mouth every 8 (eight) hours as needed for moderate pain.    Marland Kitchen allopurinol (ZYLOPRIM) 100 MG tablet Take 100 mg by mouth 2 (two) times daily as needed (Gout).    Marland Kitchen aspirin EC 81 MG EC tablet Take 1 tablet (81 mg total) by mouth daily. Swallow whole. 30 tablet 11  . camphor-menthol (SARNA) lotion Apply topically as needed for itching. (Patient taking differently: Apply 1 application topically as needed for itching.) 222 mL 1  . carvedilol (COREG) 3.125 MG tablet Take 1 tablet (3.125 mg total) by mouth 2 (two) times daily with a meal. (Patient taking differently: Take 12.5 mg by mouth 2 (two) times daily with a meal.) 60 tablet 1  . docusate sodium (COLACE) 100 MG capsule Take 1 capsule (100 mg total) by mouth 2 (two) times daily. (Patient taking differently: Take 100 mg by mouth 2 (two) times daily as needed for mild constipation.) 60 capsule 0  . midodrine (PROAMATINE) 10 MG tablet Take 0.5 tablets (5 mg total) by mouth 3 (three) times daily. 90 tablet 1  . multivitamin (ONE-A-DAY MEN'S) TABS tablet Take 1 tablet by mouth daily. 30 tablet 11  . nitroGLYCERIN (NITROSTAT) 0.4 MG SL tablet Place 1 tablet (0.4 mg total) under the tongue every 5 (five) minutes as needed for chest pain. 100 tablet 0  . ondansetron  (ZOFRAN) 4 MG tablet Take 4 mg by mouth 3 (three) times daily as needed for nausea/vomiting.    Marland Kitchen oxycodone (OXY-IR) 5 MG capsule Take 1 capsule (5 mg total) by mouth every 4 (four) hours as needed (severe pain). 20 capsule 0  . oxyCODONE-acetaminophen (PERCOCET/ROXICET) 5-325 MG tablet Take 1-2 tablets by mouth every 4 (four) hours as needed for severe pain. 20 tablet 0  . rosuvastatin (CRESTOR) 10 MG tablet Take 1 tablet (10 mg total) by mouth daily. 30 tablet 5  . sevelamer carbonate (RENVELA) 800 MG tablet Take 2 tablets (1,600 mg total) by mouth 3 (three) times daily with meals. (Patient taking differently: Take 800-1,600 mg by mouth See admin instructions. Take 1600 mg with each meal and 800 mg with each snack) 180 tablet 0   No current facility-administered medications for this visit.     ROS:  See HPI  Physical Exam:  Today's Vitals   05/26/20 0950  BP: (!) 149/90  Pulse: 94  Resp: 20  Temp: 98.6 F (37 C)  TempSrc: Temporal  SpO2: 98%  Weight: 255 lb 11.2 oz (116 kg)  Height: 6\' 1"  (1.854 m)  PainSc: 4    Body mass index is 33.74 kg/m.   Incision:  Well healed Extremities:   There is a  palpable left radial pulse.   Motor and sensory are in tact.   There is a thrill present.  The graft is easily palpable   Dialysis Duplex on 05/26/2020: Patent left distal brachial artery to proximal brachial vein graft   Assessment/Plan:  This is a 45 y.o. male who is s/p:  LUE AVG on 03/24/2020 by Dr. Stanford Breed  -the pt does not have evidence of steal. -the graft is already being used and is working well.  He had his TDC removed recently.   Had discussion with pt that dialysis access does not last forever and will need further interventions or new access.  He is scheduled to start the classes for transplant in a couple of weeks. -the pt will follow up as needed   Leontine Locket, [facility] [phone]  Clinic MD:  Oneida Alar

## 2020-05-31 ENCOUNTER — Telehealth (INDEPENDENT_AMBULATORY_CARE_PROVIDER_SITE_OTHER): Payer: 59 | Admitting: Family Medicine

## 2020-05-31 DIAGNOSIS — E559 Vitamin D deficiency, unspecified: Secondary | ICD-10-CM

## 2020-05-31 DIAGNOSIS — I10 Essential (primary) hypertension: Secondary | ICD-10-CM | POA: Diagnosis not present

## 2020-05-31 DIAGNOSIS — R5381 Other malaise: Secondary | ICD-10-CM | POA: Diagnosis not present

## 2020-05-31 DIAGNOSIS — I120 Hypertensive chronic kidney disease with stage 5 chronic kidney disease or end stage renal disease: Secondary | ICD-10-CM

## 2020-05-31 DIAGNOSIS — N186 End stage renal disease: Secondary | ICD-10-CM

## 2020-05-31 NOTE — Progress Notes (Signed)
[name], Michael [name] are scheduled for a virtual visit with your provider today.    Just as we do with appointments in the office, we must obtain your consent to participate.  Your consent will be active for this visit and any virtual visit you may have with one of our providers in the next 365 days.    If you have a MyChart account, I can also send a copy of this consent to you electronically.  All virtual visits are billed to your insurance company just like a traditional visit in the office.  As this is a virtual visit, video technology does not allow for your provider to perform a traditional examination.  This may limit your provider's ability to fully assess your condition.  If your provider identifies any concerns that need to be evaluated in person or the need to arrange testing such as labs, EKG, etc, we will make arrangements to do so.    Although advances in technology are sophisticated, we cannot ensure that it will always work on either your end or our end.  If the connection with a video visit is poor, we may have to switch to a telephone visit.  With either a video or telephone visit, we are not always able to ensure that we have a secure connection.   I need to obtain your verbal consent now.   Are you willing to proceed with your visit today?   Michael [name] has provided verbal consent on 05/31/2020 for a virtual visit (video or telephone).  Virtual Visit via Video Note  I connected with Michael [name] on 05/31/20 at  1:50 PM EDT by a video enabled telemedicine application and verified that I am speaking with the correct person using two identifiers.  Location: Patient: Car Provider: [facility]   I discussed the limitations of evaluation and management by telemedicine and the availability of in person appointments. The patient expressed understanding and agreed to proceed.  History of Present Illness: Michael [name] is a 45 year old male with a medical history  significant for accelerated hypertension, end-stage renal disease on dialysis, polycystic kidney disease, gouty arthritis, and hyperlipidemia presents via video for a follow-up of chronic conditions.  Patient states that he has been well and is without complaints today.  Patient has a long history of end-stage renal disease and is undergoing dialysis on Monday, Wednesday, and Friday.  Patient is in preparation for kidney transplant at this time.  He says that his brother is a perfect match and is willing to donate a kidney. Patient is no longer on antihypertensive medications since starting dialysis.  He says that blood pressure has been very well controlled. Today, patient is requesting a personal care aide to assist with duties of daily living.  Patient has reports of increased fatigue when attempting to complete ADLs.  Patient lives alone and does not have local family support. Past Medical History:  Diagnosis Date  . CKD (chronic kidney disease)    M-W-F  . Gout   . History of renal dialysis    M-W-F  . HLD (hyperlipidemia)   . Hypertension   . Pneumonia   . Polycystic kidney disease   . Sleep apnea 07/29/2018   uses cpap nightly  . Vitamin D deficiency 11/2018    Review of Systems  Constitutional: Negative for chills and fever.  HENT: Negative.   Eyes: Negative.   Respiratory: Negative.   Cardiovascular: Negative.   Gastrointestinal: Negative.   Genitourinary: Negative.  Skin: Negative.   Neurological: Negative.   Psychiatric/Behavioral: Negative.      Assessment and Plan: 1. End stage renal disease due to hypertension [facility]) Continue dialysis on Monday, Wednesday, and Friday.  Also, patient is in preparation for kidney transplant  2. Essential hypertension Patient no longer warrants BP medications.  Blood pressure is very well controlled.  Will defer to nephrology for further medication management  3. Physical deconditioning Patient is requesting a personal care aide to  assist with activities of daily living.  Will complete forms for approval.  Follow Up Instructions:    I discussed the assessment and treatment plan with the patient. The patient was provided an opportunity to ask questions and all were answered. The patient agreed with the plan and demonstrated an understanding of the instructions.   The patient was advised to call back or seek an in-person evaluation if the symptoms worsen or if the condition fails to improve as anticipated.  I provided 10 minutes of non-face-to-face time during this encounter.  [name]  APRN, MSN, FNP-C [facility] [address] [place], [place] [address] [phone]  05/31/2020  3:03 PM

## 2020-06-03 ENCOUNTER — Emergency Department (HOSPITAL_COMMUNITY)
Admission: EM | Admit: 2020-06-03 | Discharge: 2020-06-03 | Disposition: A | Discharge status: Home / Self Care | Payer: 59 | Attending: Emergency Medicine | Admitting: Emergency Medicine

## 2020-06-03 ENCOUNTER — Encounter (HOSPITAL_COMMUNITY): Payer: Self-pay | Admitting: *Deleted

## 2020-06-03 ENCOUNTER — Other Ambulatory Visit: Payer: Self-pay

## 2020-06-03 ENCOUNTER — Emergency Department (HOSPITAL_COMMUNITY): Payer: 59

## 2020-06-03 DIAGNOSIS — R519 Headache, unspecified: Secondary | ICD-10-CM | POA: Insufficient documentation

## 2020-06-03 DIAGNOSIS — Z79899 Other long term (current) drug therapy: Secondary | ICD-10-CM | POA: Insufficient documentation

## 2020-06-03 DIAGNOSIS — R109 Unspecified abdominal pain: Secondary | ICD-10-CM | POA: Insufficient documentation

## 2020-06-03 DIAGNOSIS — Y9355 Activity, bike riding: Secondary | ICD-10-CM | POA: Insufficient documentation

## 2020-06-03 DIAGNOSIS — Z7982 Long term (current) use of aspirin: Secondary | ICD-10-CM | POA: Diagnosis not present

## 2020-06-03 DIAGNOSIS — N189 Chronic kidney disease, unspecified: Secondary | ICD-10-CM | POA: Diagnosis not present

## 2020-06-03 DIAGNOSIS — Z87891 Personal history of nicotine dependence: Secondary | ICD-10-CM | POA: Diagnosis not present

## 2020-06-03 DIAGNOSIS — E875 Hyperkalemia: Secondary | ICD-10-CM

## 2020-06-03 DIAGNOSIS — W01198A Fall on same level from slipping, tripping and stumbling with subsequent striking against other object, initial encounter: Secondary | ICD-10-CM | POA: Insufficient documentation

## 2020-06-03 DIAGNOSIS — R1110 Vomiting, unspecified: Secondary | ICD-10-CM | POA: Insufficient documentation

## 2020-06-03 DIAGNOSIS — S30810A Abrasion of lower back and pelvis, initial encounter: Secondary | ICD-10-CM | POA: Insufficient documentation

## 2020-06-03 DIAGNOSIS — S39012A Strain of muscle, fascia and tendon of lower back, initial encounter: Secondary | ICD-10-CM

## 2020-06-03 DIAGNOSIS — I129 Hypertensive chronic kidney disease with stage 1 through stage 4 chronic kidney disease, or unspecified chronic kidney disease: Secondary | ICD-10-CM | POA: Insufficient documentation

## 2020-06-03 DIAGNOSIS — S8991XA Unspecified injury of right lower leg, initial encounter: Secondary | ICD-10-CM | POA: Diagnosis present

## 2020-06-03 DIAGNOSIS — S80211A Abrasion, right knee, initial encounter: Secondary | ICD-10-CM | POA: Insufficient documentation

## 2020-06-03 DIAGNOSIS — Z992 Dependence on renal dialysis: Secondary | ICD-10-CM | POA: Insufficient documentation

## 2020-06-03 DIAGNOSIS — M549 Dorsalgia, unspecified: Secondary | ICD-10-CM

## 2020-06-03 LAB — CBC WITH DIFFERENTIAL/PLATELET
Abs Immature Granulocytes: 0.04 10*3/uL (ref 0.00–0.07)
Basophils Absolute: 0 10*3/uL (ref 0.0–0.1)
Basophils Relative: 0 %
Eosinophils Absolute: 0.1 10*3/uL (ref 0.0–0.5)
Eosinophils Relative: 1 %
HCT: 35.8 % — ABNORMAL LOW (ref 39.0–52.0)
Hemoglobin: 11.4 g/dL — ABNORMAL LOW (ref 13.0–17.0)
Immature Granulocytes: 0 %
Lymphocytes Relative: 21 %
Lymphs Abs: 2.1 10*3/uL (ref 0.7–4.0)
MCH: 28.5 pg (ref 26.0–34.0)
MCHC: 31.8 g/dL (ref 30.0–36.0)
MCV: 89.5 fL (ref 80.0–100.0)
Monocytes Absolute: 0.6 10*3/uL (ref 0.1–1.0)
Monocytes Relative: 6 %
Neutro Abs: 6.9 10*3/uL (ref 1.7–7.7)
Neutrophils Relative %: 72 %
Platelets: 257 10*3/uL (ref 150–400)
RBC: 4 MIL/uL — ABNORMAL LOW (ref 4.22–5.81)
RDW: 14.9 % (ref 11.5–15.5)
WBC: 9.8 10*3/uL (ref 4.0–10.5)
nRBC: 0 % (ref 0.0–0.2)

## 2020-06-03 LAB — BASIC METABOLIC PANEL
Anion gap: 13 (ref 5–15)
BUN: 65 mg/dL — ABNORMAL HIGH (ref 6–20)
CO2: 27 mmol/L (ref 22–32)
Calcium: 8.5 mg/dL — ABNORMAL LOW (ref 8.9–10.3)
Chloride: 97 mmol/L — ABNORMAL LOW (ref 98–111)
Creatinine, Ser: 12.2 mg/dL — ABNORMAL HIGH (ref 0.61–1.24)
GFR, Estimated: 5 mL/min — ABNORMAL LOW (ref >60)
Glucose, Bld: 96 mg/dL (ref 70–99)
Potassium: 5.3 mmol/L — ABNORMAL HIGH (ref 3.5–5.1)
Sodium: 137 mmol/L (ref 135–145)

## 2020-06-03 MED ORDER — OXYCODONE-ACETAMINOPHEN 5-325 MG PO TABS
2.0000 | ORAL_TABLET | Freq: Once | ORAL | Status: AC
Start: 2020-06-03 — End: 2020-06-03
  Administered 2020-06-03: 2 via ORAL
  Filled 2020-06-03: qty 2

## 2020-06-03 MED ORDER — LIDOCAINE 5 % EX PTCH: 1.0000 | MEDICATED_PATCH | CUTANEOUS | 0 refills | Status: DC

## 2020-06-03 MED ORDER — OXYCODONE HCL 5 MG PO TABS
10.0000 mg | ORAL_TABLET | Freq: Once | ORAL | Status: AC
  Administered 2020-06-03: 10 mg via ORAL
  Filled 2020-06-03: qty 2

## 2020-06-03 MED ORDER — CYCLOBENZAPRINE HCL 10 MG PO TABS: 10.0000 mg | ORAL_TABLET | Freq: Two times a day (BID) | ORAL | 0 refills | Status: DC | PRN

## 2020-06-03 MED ORDER — ONDANSETRON 4 MG PO TBDP: ORAL_TABLET | ORAL | 0 refills | Status: DC

## 2020-06-03 MED ORDER — ONDANSETRON 4 MG PO TBDP
4.0000 mg | ORAL_TABLET | Freq: Once | ORAL | Status: AC
  Administered 2020-06-03: 4 mg via ORAL
  Filled 2020-06-03: qty 1

## 2020-06-03 NOTE — ED Notes (Signed)
Pt provided urinal.

## 2020-06-03 NOTE — ED Notes (Signed)
Patient transported to CT 

## 2020-06-03 NOTE — Discharge Instructions (Signed)
Your potassium level slightly elevated.  Please go to dialysis tomorrow to get an extra session  Your CT did not show any fractures.  Please take Flexeril for muscle spasms  Please use lidocaine patch on the back.    Take Zofran for nausea  See your doctor for follow-up   Return to ER if you have worse back pain, vomiting, chest
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
## 2020-06-03 NOTE — Social Work (Signed)
CSW coordinated transportation back to [facility] via [employer]

## 2020-06-03 NOTE — ED Triage Notes (Signed)
Pt had a fall yesterday (possible syncope causing fall).  Pt has abrasion to right lumbar and sacral abrasions from fall.  Pt then had 2 episodes of vomiting this am. Pt went to HD for scheduled HD but felt unwell and vomited one more time.  No HD treatment, sent to ED for evaluation. CBG was 117

## 2020-06-03 NOTE — ED Notes (Signed)
Patient transported to X-ray 

## 2020-06-03 NOTE — ED Notes (Addendum)
Pt requesting taxi voucher home, social worker at bedside

## 2020-06-18 ENCOUNTER — Other Ambulatory Visit: Payer: Self-pay

## 2020-06-28 ENCOUNTER — Ambulatory Visit (INDEPENDENT_AMBULATORY_CARE_PROVIDER_SITE_OTHER): Payer: 59 | Admitting: Family Medicine

## 2020-06-28 ENCOUNTER — Other Ambulatory Visit: Payer: Self-pay

## 2020-06-28 ENCOUNTER — Encounter: Payer: Self-pay | Admitting: Family Medicine

## 2020-06-28 VITALS — BP 134/77 | HR 103 | Ht 73.0 in | Wt 265.0 lb

## 2020-06-28 DIAGNOSIS — Z0000 Encounter for general adult medical examination without abnormal findings: Secondary | ICD-10-CM

## 2020-06-28 NOTE — Progress Notes (Signed)
Patient [facility]    ANNUAL PREVENTATIVE VISIT AND CPE  Subjective:  Michael [name] is a 45 y.o. male medical history significant for end-stage renal disease on hemodialysis, essential hypertension, acute Gardy arthritis, polycystic kidney disease, depression, and obesity that presents for annual physical exam. Patient says that he is doing fairly well today.  He undergoes dialysis on Monday, Wednesday, and Fridays.  Patient is in preparation for kidney transplant list.   Patient states that he is following a renal diet and has recently started an exercise program.  He says that he feels better since changing his diet and starting exercise. Patient says that it has been around 4 years since his last physical exam.  He is up-to-date with all vaccinations per nephrology.  He does not perform monthly self testicular exams.  He has never had a prostate exam. He typically wears a seatbelt.  He does not wear sunscreen.  Patient is not sexually active at this time.  He has 1 son who is a Administrator, arts in college.  Lab Results  Component Value Date   CHOL 173 12/02/2019   HDL 25 (L) 12/02/2019   LDLCALC 95 12/02/2019   TRIG 267 (H) 12/02/2019   CHOLHDL 6.9 12/02/2019   Lab Results  Component Value Date   HGBA1C 5.6 11/24/2019   HGBA1C 5.6 11/24/2019   HGBA1C 5.6 (A) 11/24/2019   HGBA1C 5.6 11/24/2019   Patient is on Vitamin D supplement.   Lab Results  Component Value Date   VD25OH 30.86 11/27/2019       Names of Other Physician/Practitioners you currently use:   Patient Care Team: [name], FNP as PCP - General ([place]   Medication Review: Current Outpatient Medications on File Prior to Visit  Medication Sig Dispense Refill  . acetaminophen (TYLENOL) 650 MG CR tablet Take 650 mg by mouth daily.    Marland Kitchen allopurinol (ZYLOPRIM) 100 MG tablet Take 100 mg by mouth 2 (two) times daily as needed (Gout).     Marland Kitchen allopurinol (ZYLOPRIM) 100 MG tablet TAKE 1 TABLET (100 MG TOTAL) BY MOUTH 2 (TWO) TIMES DAILY. (Patient not taking: Reported on 12/07/2019) 60 tablet 6  . amLODipine (NORVASC) 5 MG tablet Take 5 mg by mouth daily. 30 tablet 5  . aspirin EC 81 MG EC tablet Take 1 tablet (81 mg total) by mouth daily. Swallow whole. 30 tablet 11  . camphor-menthol (SARNA) lotion Apply topically as needed for itching. (Patient taking differently: Apply 1 application topically as needed for itching.) 222 mL 1  . carvedilol (COREG) 3.125 MG tablet TAKE 4 TABLETS (12.5 MG TOTAL) BY MOUTH 2 (TWO) TIMES DAILY WITH A MEAL. (Patient taking differently: Take 12.5 mg by mouth 2 (two) times daily with a meal.) 60 tablet 1  . cyclobenzaprine (FLEXERIL) 10 MG tablet Take 1 tablet (10 mg total) by mouth 2 (two) times daily as needed. 10 tablet 0  . docusate sodium (COLACE) 100 MG capsule Take 1 capsule (100 mg total) by mouth 2 (two) times daily. (Patient taking differently: Take 100 mg by mouth 2 (two) times daily as needed for mild constipation.) 60 capsule 0  . lidocaine (LIDODERM) 5 % Place 1 patch onto the skin daily. Remove & Discard patch within 12 hours or as directed by MD 10 patch 0  . midodrine (PROAMATINE) 10 MG tablet Take 0.5 tablets (5 mg total) by mouth 3 (three) times daily. 90 tablet 1  .  midodrine (PROAMATINE) 5 MG tablet TAKE 1 TABLET (5 MG TOTAL) BY MOUTH 3 (THREE) TIMES DAILY. 180 tablet 1  . multivitamin (ONE-A-DAY MEN'S) TABS tablet Take 1 tablet by mouth daily. 30 tablet 11  . nitroGLYCERIN (NITROSTAT) 0.4 MG SL tablet Place 1 tablet (0.4 mg total) under the tongue every 5 (five) minutes as needed for chest pain. 100 tablet 0  . ondansetron (ZOFRAN ODT) 4 MG disintegrating tablet 4mg  ODT q4 hours prn nausea/vomit 10 tablet 0  . ondansetron (ZOFRAN) 4 MG tablet Take 4 mg by mouth 3 (three) times daily as needed for nausea/vomiting.    . ondansetron (ZOFRAN) 4 MG tablet TAKE 1 TABLET BY MOUTH THREE TIMES  DAILY AS NEEDED FOR NAUSEA 60 tablet 0  . oxyCODONE (OXY IR/ROXICODONE) 5 MG immediate release tablet TAKE 1 CAPSULE (5 MG TOTAL) BY MOUTH EVERY 6 (SIX) HOURS AS NEEDED (SEVERE PAIN). 10 tablet 0  . oxycodone (OXY-IR) 5 MG capsule Take 1 capsule (5 mg total) by mouth every 4 (four) hours as needed (severe pain). 20 capsule 0  . rosuvastatin (CRESTOR) 10 MG tablet TAKE 1 TABLET (10 MG TOTAL) BY MOUTH DAILY. 30 tablet 5  . sevelamer carbonate (RENVELA) 800 MG tablet Take 2 tablets (1,600 mg total) by mouth 3 (three) times daily with meals. (Patient taking differently: Take 800-1,600 mg by mouth See admin instructions. Take 1600 mg with each meal and 800 mg with each snack) 180 tablet 0  . sevelamer carbonate (RENVELA) 800 MG tablet TAKE 2 TABLETS BY MOUTH 3 (THREE) TIMES DAILY WITH MEALS. 180 tablet 6  . [DISCONTINUED] isosorbide dinitrate (ISORDIL) 10 MG tablet Take 1 tablet (10 mg total) by mouth 2 (two) times daily. (Patient not taking: No sig reported) 60 tablet 1   No current facility-administered medications on file prior to visit.    Current Problems (verified) Patient Active Problem List   Diagnosis Date Noted  . AKI (acute kidney injury) ([place]) 12/07/2019  . Uremia 12/02/2019  . Chest pain 12/02/2019  . Elevated troponin 12/02/2019  . Anemia in CKD (chronic kidney disease) 12/02/2019  . Adjustment disorder with mixed anxiety and depressed mood 11/28/2019  . Renal failure 11/25/2019  . Hyperkalemia 11/25/2019  . Normocytic anemia 11/25/2019  . Metabolic acidosis, increased anion gap   . Hematochezia   . Chronic gout due to renal impairment without tophus 12/14/2018  . Polycystic kidney disease 11/09/2016  . Observed sleep apnea 09/01/2016  . Excessive daytime sleepiness 09/01/2016  . Primary snoring 09/01/2016  . Acute pyelonephritis 05/25/2016  . Acute gouty arthritis 05/25/2016  . Hematuria 05/25/2016  . Essential hypertension, benign 05/25/2016  . Morbid obesity ([place])  05/25/2016  . Hypertensive crisis 05/23/2016  . Adult polycystic kidney disease 05/23/2016  . SIRS (systemic inflammatory response syndrome) ([place]) 05/23/2016    Screening Tests Health Maintenance  Topic Date Due  . COVID-19 Vaccine (3 - Booster for Moderna series) 03/17/2020  . TETANUS/TDAP  08/22/2024  . Hepatitis C Screening  Completed  . HIV Screening  Completed  . HPV VACCINES  Aged Out    Immunization History  Administered Date(s) Administered  . Influenza Inj Mdck Quad With Preservative 01/28/2018  . Influenza,inj,Quad PF,6+ Mos 01/30/2017  . Moderna Sars-Covid-2 Vaccination 08/18/2019, 09/16/2019    This medication is not covered by your insurance if dispensed from your Physician's office. You can obtain your vaccine at an area Pharmacy.  Allergies as of 06/28/2020   No Known Allergies     Medication List  Accurate as of June 28, 2020 11:04 AM. If you have any questions, ask your nurse or doctor.        acetaminophen 650 MG CR tablet Commonly known as: TYLENOL Take 650 mg by mouth daily.   allopurinol 100 MG tablet Commonly known as: ZYLOPRIM Take 100 mg by mouth 2 (two) times daily as needed (Gout).   allopurinol 100 MG tablet Commonly known as: ZYLOPRIM TAKE 1 TABLET (100 MG TOTAL) BY MOUTH 2 (TWO) TIMES DAILY.   amLODipine 5 MG tablet Commonly known as: NORVASC Take 5 mg by mouth daily.   aspirin 81 MG EC tablet Take 1 tablet (81 mg total) by mouth daily. Swallow whole.   camphor-menthol lotion Commonly known as: SARNA Apply topically as needed for itching. What changed: how much to take   carvedilol 3.125 MG tablet Commonly known as: COREG TAKE 4 TABLETS (12.5 MG TOTAL) BY MOUTH 2 (TWO) TIMES DAILY WITH A MEAL. What changed:   how much to take  how to take this  when to take this   cyclobenzaprine 10 MG tablet Commonly known as: FLEXERIL Take 1 tablet (10 mg total) by mouth 2 (two) times daily as needed.   docusate sodium 100  MG capsule Commonly known as: Colace Take 1 capsule (100 mg total) by mouth 2 (two) times daily. What changed:   when to take this  reasons to take this   lidocaine 5 % Commonly known as: Lidoderm Place 1 patch onto the skin daily. Remove & Discard patch within 12 hours or as directed by MD   midodrine 10 MG tablet Commonly known as: PROAMATINE Take 0.5 tablets (5 mg total) by mouth 3 (three) times daily.   midodrine 5 MG tablet Commonly known as: PROAMATINE TAKE 1 TABLET (5 MG TOTAL) BY MOUTH 3 (THREE) TIMES DAILY.   multivitamin Tabs tablet Take 1 tablet by mouth daily.   nitroGLYCERIN 0.4 MG SL tablet Commonly known as: Nitrostat Place 1 tablet (0.4 mg total) under the tongue every 5 (five) minutes as needed for chest pain.   ondansetron 4 MG disintegrating tablet Commonly known as: Zofran ODT 4mg  ODT q4 hours prn nausea/vomit   ondansetron 4 MG tablet Commonly known as: ZOFRAN TAKE 1 TABLET BY MOUTH THREE TIMES DAILY AS NEEDED FOR NAUSEA   ondansetron 4 MG tablet Commonly known as: ZOFRAN Take 4 mg by mouth 3 (three) times daily as needed for nausea/vomiting.   oxyCODONE 5 MG immediate release tablet Commonly known as: Oxy IR/ROXICODONE TAKE 1 CAPSULE (5 MG TOTAL) BY MOUTH EVERY 6 (SIX) HOURS AS NEEDED (SEVERE PAIN).   oxycodone 5 MG capsule Commonly known as: OXY-IR Take 1 capsule (5 mg total) by mouth every 4 (four) hours as needed (severe pain).   rosuvastatin 10 MG tablet Commonly known as: CRESTOR TAKE 1 TABLET (10 MG TOTAL) BY MOUTH DAILY.   sevelamer carbonate 800 MG tablet Commonly known as: RENVELA Take 2 tablets (1,600 mg total) by mouth 3 (three) times daily with meals. What changed:   how much to take  when to take this  additional instructions   sevelamer carbonate 800 MG tablet Commonly known as: RENVELA TAKE 2 TABLETS BY MOUTH 3 (THREE) TIMES DAILY WITH MEALS. What changed: Another medication with the same name was changed. Make  sure you understand how and when to take each.       Past Surgical History:  Procedure Laterality Date  . AV FISTULA PLACEMENT Left 03/24/2020   Procedure: INSERTION OF  LEFT UPPER EXTREMITY ARTERIOVENOUS (AV) GORE-TEX GRAFT;  Surgeon: [name], MD;  Location: [facility];  Service: Vascular;  Laterality: Left;  PERIPHERAL NERVE BLOCK  . BASCILIC VEIN TRANSPOSITION Left 12/11/2019   Procedure: 1ST STAGE BASILIC TRANSPOSITION OF LEFT ARM;  Surgeon: Angelia Mould, MD;  Location: [facility];  Service: Vascular;  Laterality: Left;  . IR FLUORO GUIDE CV LINE RIGHT  12/09/2019  . IR US GUIDE VASC ACCESS RIGHT  12/09/2019  . LEFT HEART CATH AND CORONARY ANGIOGRAPHY N/A 12/09/2019   Procedure: LEFT HEART CATH AND CORONARY ANGIOGRAPHY;  Surgeon: Adrian Prows, MD;  Location: [facility];  Service: Cardiovascular;  Laterality: N/A;   Family History  Problem Relation Age of Onset  . Bipolar disorder Father     History reviewed: allergies, current medications, past family history, past medical history, past social history, past surgical history and problem list   Risk Factors:  Tobacco Social History   Tobacco Use  . Smoking status: Former Smoker    Types: Cigarettes    Quit date: 2015    Years since quitting: 7.2  . Smokeless tobacco: Never Used  Vaping Use  . Vaping Use: Never used  Substance Use Topics  . Alcohol use: Not Currently    Comment: occasional wine  . Drug use: No    Alcohol Current alcohol use: none  Caffeine Current caffeine use: denies use  Exercise Current exercise: aerobics  Nutrition/Diet Current diet: Renal diet  Cardiac risk factors: hypertension and obesity (BMI >= 30 kg/m2).  Depression Screen PHQ9 SCORE ONLY 02/16/2020 11/24/2019 10/19/2019  PHQ-9 Total Score 1 6 0     Advanced directives Does patient have a Health Care Power of Attorney? No Does patient have a Living Will? No   Objective:     Blood pressure 134/77, pulse (!) 103,  height 6\' 1"  (1.854 m), weight 265 lb (120.2 kg), SpO2 100 %. There is no height or weight on file to calculate BMI.  General appearance: alert, no distress, WD/WN, male  HEENT: normocephalic, sclerae anicteric, TMs pearly, nares patent, no discharge or erythema, pharynx normal Oral cavity: MMM, no lesions Neck: supple, no lymphadenopathy, no thyromegaly, no masses Heart: RRR, normal S1, S2, no murmurs Lungs: CTA bilaterally, no wheezes, rhonchi, or rales Abdomen: +bs, soft, non tender, non distended, no masses, no hepatomegaly, no splenomegaly Musculoskeletal: nontender, no swelling, no obvious deformity Extremities: no edema, no cyanosis, no clubbing Pulses: 2+ symmetric, upper and lower extremities, normal cap refill Neurological: alert, oriented x 3, CN2-12 intact, strength normal upper extremities and lower extremities, sensation normal throughout, DTRs 2+ throughout, no cerebellar signs, gait normal Testicular: B/L testicle descended. No masses Rectal/Prostate: No nodules or localized areas of softness, tenderness or induration palpated. Rectal tone normal. No external or internal hemorrhoids noted. Psychiatric: normal affect, behavior normal, pleasant   Assessment:  Patient denies any difficulties at home. No trouble with ADLs, depression or falls. No recent changes to vision or hearing. Is UTD with immunizations. Is UTD with screening. Discussed Advanced Directives.  Encouraged heart healthy diet, exercise as tolerated and adequate sleep. Declines flu shot. Testicular and rectal examination done today.     Plan:   During the course of the visit the patient was educated and counseled about appropriate screening and preventive services including:    Pneumococcal vaccine   Influenza vaccine  Td vaccine  Screening electrocardiogram  Bone densitometry screening  Colorectal cancer screening  Diabetes screening  Glaucoma screening  Nutrition counseling  Advanced  directives: requested  Screening recommendations, referrals: Vaccinations: Please see documentation below and orders this visit.  Nutrition assessed and recommended  Recommended yearly dental visit for hygiene and checkup Advanced directives - requested     The patient's weight, height, BMI, and visual acuity have been recorded in the chart.  I have made referrals, counseling, and provided education to the patient based on review of the above and I have provided the patient with a written personalized care plan for preventive services.     [name]  APRN, MSN, FNP-C Patient [facility] [address] [place], [place] [address] [phone]

## 2020-07-04 ENCOUNTER — Emergency Department (HOSPITAL_COMMUNITY): Payer: 59

## 2020-07-04 ENCOUNTER — Observation Stay (HOSPITAL_COMMUNITY)
Admission: EM | Admit: 2020-07-04 | Discharge: 2020-07-06 | Disposition: A | Discharge status: Home / Self Care | Payer: 59 | Attending: Family Medicine | Admitting: Family Medicine

## 2020-07-04 ENCOUNTER — Encounter (HOSPITAL_COMMUNITY): Payer: Self-pay

## 2020-07-04 ENCOUNTER — Other Ambulatory Visit: Payer: Self-pay

## 2020-07-04 DIAGNOSIS — S065X0A Traumatic subdural hemorrhage without loss of consciousness, initial encounter: Principal | ICD-10-CM | POA: Insufficient documentation

## 2020-07-04 DIAGNOSIS — Z87891 Personal history of nicotine dependence: Secondary | ICD-10-CM | POA: Diagnosis not present

## 2020-07-04 DIAGNOSIS — E6609 Other obesity due to excess calories: Secondary | ICD-10-CM

## 2020-07-04 DIAGNOSIS — Z7982 Long term (current) use of aspirin: Secondary | ICD-10-CM | POA: Insufficient documentation

## 2020-07-04 DIAGNOSIS — Z992 Dependence on renal dialysis: Secondary | ICD-10-CM

## 2020-07-04 DIAGNOSIS — I6200 Nontraumatic subdural hemorrhage, unspecified: Secondary | ICD-10-CM | POA: Diagnosis present

## 2020-07-04 DIAGNOSIS — E875 Hyperkalemia: Secondary | ICD-10-CM | POA: Diagnosis not present

## 2020-07-04 DIAGNOSIS — S0990XA Unspecified injury of head, initial encounter: Secondary | ICD-10-CM | POA: Diagnosis present

## 2020-07-04 DIAGNOSIS — N186 End stage renal disease: Secondary | ICD-10-CM

## 2020-07-04 DIAGNOSIS — Z20822 Contact with and (suspected) exposure to covid-19: Secondary | ICD-10-CM | POA: Insufficient documentation

## 2020-07-04 DIAGNOSIS — S065X9A Traumatic subdural hemorrhage with loss of consciousness of unspecified duration, initial encounter: Secondary | ICD-10-CM

## 2020-07-04 DIAGNOSIS — W228XXA Striking against or struck by other objects, initial encounter: Secondary | ICD-10-CM | POA: Diagnosis not present

## 2020-07-04 DIAGNOSIS — Q612 Polycystic kidney, adult type: Secondary | ICD-10-CM

## 2020-07-04 DIAGNOSIS — S065XAA Traumatic subdural hemorrhage with loss of consciousness status unknown, initial encounter: Secondary | ICD-10-CM

## 2020-07-04 DIAGNOSIS — I120 Hypertensive chronic kidney disease with stage 5 chronic kidney disease or end stage renal disease: Secondary | ICD-10-CM | POA: Insufficient documentation

## 2020-07-04 DIAGNOSIS — Z6834 Body mass index (BMI) 34.0-34.9, adult: Secondary | ICD-10-CM

## 2020-07-04 LAB — CBC WITH DIFFERENTIAL/PLATELET
Abs Immature Granulocytes: 0.06 10*3/uL (ref 0.00–0.07)
Basophils Absolute: 0.1 10*3/uL (ref 0.0–0.1)
Basophils Relative: 1 %
Eosinophils Absolute: 0.2 10*3/uL (ref 0.0–0.5)
Eosinophils Relative: 2 %
HCT: 36.9 % — ABNORMAL LOW (ref 39.0–52.0)
Hemoglobin: 11.5 g/dL — ABNORMAL LOW (ref 13.0–17.0)
Immature Granulocytes: 1 %
Lymphocytes Relative: 24 %
Lymphs Abs: 2.4 10*3/uL (ref 0.7–4.0)
MCH: 28.3 pg (ref 26.0–34.0)
MCHC: 31.2 g/dL (ref 30.0–36.0)
MCV: 90.7 fL (ref 80.0–100.0)
Monocytes Absolute: 0.7 10*3/uL (ref 0.1–1.0)
Monocytes Relative: 7 %
Neutro Abs: 6.5 10*3/uL (ref 1.7–7.7)
Neutrophils Relative %: 65 %
Platelets: 231 10*3/uL (ref 150–400)
RBC: 4.07 MIL/uL — ABNORMAL LOW (ref 4.22–5.81)
RDW: 14.6 % (ref 11.5–15.5)
WBC: 9.9 10*3/uL (ref 4.0–10.5)
nRBC: 0 % (ref 0.0–0.2)

## 2020-07-04 LAB — COMPREHENSIVE METABOLIC PANEL
ALT: 16 U/L (ref 0–44)
AST: 16 U/L (ref 15–41)
Albumin: 3.8 g/dL (ref 3.5–5.0)
Alkaline Phosphatase: 41 U/L (ref 38–126)
Anion gap: 19 — ABNORMAL HIGH (ref 5–15)
BUN: 99 mg/dL — ABNORMAL HIGH (ref 6–20)
CO2: 21 mmol/L — ABNORMAL LOW (ref 22–32)
Calcium: 7.2 mg/dL — ABNORMAL LOW (ref 8.9–10.3)
Chloride: 96 mmol/L — ABNORMAL LOW (ref 98–111)
Creatinine, Ser: 13.51 mg/dL — ABNORMAL HIGH (ref 0.61–1.24)
GFR, Estimated: 4 mL/min — ABNORMAL LOW (ref >60)
Glucose, Bld: 80 mg/dL (ref 70–99)
Potassium: 6.9 mmol/L (ref 3.5–5.1)
Sodium: 136 mmol/L (ref 135–145)
Total Bilirubin: 0.5 mg/dL (ref 0.3–1.2)
Total Protein: 7.7 g/dL (ref 6.5–8.1)

## 2020-07-04 LAB — URINALYSIS, ROUTINE W REFLEX MICROSCOPIC
Bacteria, UA: NONE SEEN
Bilirubin Urine: NEGATIVE
Glucose, UA: NEGATIVE mg/dL
Hgb urine dipstick: NEGATIVE
Ketones, ur: NEGATIVE mg/dL
Leukocytes,Ua: NEGATIVE
Nitrite: NEGATIVE
Protein, ur: 100 mg/dL — AB
Specific Gravity, Urine: 1.011 (ref 1.005–1.030)
pH: 8 (ref 5.0–8.0)

## 2020-07-04 LAB — RESP PANEL BY RT-PCR (FLU A&B, COVID) ARPGX2
Influenza A by PCR: NEGATIVE
Influenza B by PCR: NEGATIVE
SARS Coronavirus 2 by RT PCR: NEGATIVE

## 2020-07-04 LAB — LIPASE, BLOOD: Lipase: 52 U/L — ABNORMAL HIGH (ref 11–51)

## 2020-07-04 MED ORDER — DOXERCALCIFEROL 4 MCG/2ML IV SOLN
6.0000 ug | INTRAVENOUS | Status: DC
  Filled 2020-07-04: qty 4

## 2020-07-04 MED ORDER — INSULIN ASPART 100 UNIT/ML ~~LOC~~ SOLN
5.0000 [IU] | Freq: Once | SUBCUTANEOUS | Status: AC
  Administered 2020-07-04: 5 [IU] via INTRAVENOUS

## 2020-07-04 MED ORDER — ZOLPIDEM TARTRATE 5 MG PO TABS: 5.0000 mg | ORAL_TABLET | Freq: Every evening | ORAL | Status: DC | PRN

## 2020-07-04 MED ORDER — SODIUM CHLORIDE 0.9% FLUSH
3.0000 mL | Freq: Two times a day (BID) | INTRAVENOUS | Status: DC
  Administered 2020-07-04 – 2020-07-05 (×3): 3 mL via INTRAVENOUS

## 2020-07-04 MED ORDER — ONDANSETRON HCL 4 MG/2ML IJ SOLN: 4.0000 mg | Freq: Four times a day (QID) | INTRAMUSCULAR | Status: DC | PRN

## 2020-07-04 MED ORDER — DOCUSATE SODIUM 283 MG RE ENEM
1.0000 | ENEMA | RECTAL | Status: DC | PRN
  Filled 2020-07-04: qty 1

## 2020-07-04 MED ORDER — DIPHENHYDRAMINE HCL 50 MG/ML IJ SOLN: 12.5000 mg | Freq: Once | INTRAMUSCULAR | Status: DC

## 2020-07-04 MED ORDER — HYDROXYZINE HCL 25 MG PO TABS: 25.0000 mg | ORAL_TABLET | Freq: Three times a day (TID) | ORAL | Status: DC | PRN

## 2020-07-04 MED ORDER — CALCIUM ACETATE (PHOS BINDER) 667 MG PO CAPS
2001.0000 mg | ORAL_CAPSULE | Freq: Three times a day (TID) | ORAL | Status: DC
  Administered 2020-07-05 – 2020-07-06 (×4): 2001 mg via ORAL
  Filled 2020-07-04 (×4): qty 3

## 2020-07-04 MED ORDER — NEPRO/CARBSTEADY PO LIQD
237.0000 mL | Freq: Three times a day (TID) | ORAL | Status: DC | PRN
  Filled 2020-07-04: qty 237

## 2020-07-04 MED ORDER — HYDROCODONE-ACETAMINOPHEN 5-325 MG PO TABS
1.0000 | ORAL_TABLET | ORAL | Status: DC | PRN
  Administered 2020-07-04 – 2020-07-06 (×5): 2 via ORAL
  Filled 2020-07-04 (×5): qty 2

## 2020-07-04 MED ORDER — CHLORHEXIDINE GLUCONATE CLOTH 2 % EX PADS: 6.0000 | MEDICATED_PAD | Freq: Every day | CUTANEOUS | Status: DC

## 2020-07-04 MED ORDER — CAMPHOR-MENTHOL 0.5-0.5 % EX LOTN
1.0000 "application " | TOPICAL_LOTION | Freq: Three times a day (TID) | CUTANEOUS | Status: DC | PRN
  Administered 2020-07-05: 1 via TOPICAL
  Filled 2020-07-04 (×2): qty 222

## 2020-07-04 MED ORDER — PROCHLORPERAZINE EDISYLATE 10 MG/2ML IJ SOLN
10.0000 mg | Freq: Once | INTRAMUSCULAR | Status: AC
  Administered 2020-07-04: 10 mg via INTRAVENOUS
  Filled 2020-07-04: qty 2

## 2020-07-04 MED ORDER — ONDANSETRON HCL 4 MG PO TABS: 4.0000 mg | ORAL_TABLET | Freq: Four times a day (QID) | ORAL | Status: DC | PRN

## 2020-07-04 MED ORDER — DEXTROSE 50 % IV SOLN
1.0000 | Freq: Once | INTRAVENOUS | Status: AC
  Administered 2020-07-04: 50 mL via INTRAVENOUS
  Filled 2020-07-04: qty 50

## 2020-07-04 MED ORDER — CALCIUM GLUCONATE-NACL 1-0.675 GM/50ML-% IV SOLN
1.0000 g | Freq: Once | INTRAVENOUS | Status: AC
  Administered 2020-07-04: 1000 mg via INTRAVENOUS
  Filled 2020-07-04: qty 50

## 2020-07-04 MED ORDER — CALCIUM CARBONATE ANTACID 1250 MG/5ML PO SUSP
500.0000 mg | Freq: Four times a day (QID) | ORAL | Status: DC | PRN
  Filled 2020-07-04: qty 5

## 2020-07-04 MED ORDER — SORBITOL 70 % SOLN
30.0000 mL | Status: DC | PRN
  Filled 2020-07-04: qty 30

## 2020-07-04 MED ORDER — DIPHENHYDRAMINE HCL 50 MG/ML IJ SOLN
25.0000 mg | Freq: Once | INTRAMUSCULAR | Status: AC
  Administered 2020-07-04: 25 mg via INTRAVENOUS
  Filled 2020-07-04: qty 1

## 2020-07-04 MED ORDER — ACETAMINOPHEN 650 MG RE SUPP: 650.0000 mg | Freq: Four times a day (QID) | RECTAL | Status: DC | PRN

## 2020-07-04 MED ORDER — ACETAMINOPHEN 325 MG PO TABS: 650.0000 mg | ORAL_TABLET | Freq: Four times a day (QID) | ORAL | Status: DC | PRN

## 2020-07-04 MED ORDER — HYDRALAZINE HCL 20 MG/ML IJ SOLN: 5.0000 mg | INTRAMUSCULAR | Status: DC | PRN

## 2020-07-04 NOTE — Consult Note (Signed)
Providing Compassionate, Quality Care - Together   Reason for Consult: SDH Referring Physician: Dr. Toma [name] is an 45 y.o. male.  HPI: Michael [name] is A 45 year old male with a history significant for polycystic kidney disease (M-W-F dialysis), hyperlipidemia, hypertension, and gout.  He woke up yesterday with a severe headache. He reports that he recalls hitting his head a few weeks ago, but did not have any loss of consciousness. He has not noted any new symptoms since yesterday. He did not have any loss of consciousness. He denied nausea and vomiting during my ROS, but he reported an episode of emesis and diarrhea this morning to another provider. He is not on any anticoagulation. At present, he denies seizure activity, visual disturbance, photophobia, nausea, or vomiting. His headache is much improved since receiving Vicodin a little while ago. His CT head showed an 11 mm mixed density, right cerebral convexity subdural hemorrhage.   Neurosurgery was consulted for further evaluation and recommendations.  Past Medical History:  Diagnosis Date  . Gout   . History of renal dialysis    M-W-F  . HLD (hyperlipidemia)   . Hypertension   . Pneumonia   . Polycystic kidney disease   . Sleep apnea 07/29/2018   uses cpap nightly  . Vitamin D deficiency 11/2018    Past Surgical History:  Procedure Laterality Date  . AV FISTULA PLACEMENT Left 03/24/2020   Procedure: INSERTION OF LEFT UPPER EXTREMITY ARTERIOVENOUS (AV) GORE-TEX GRAFT;  Surgeon: [name], MD;  Location: [facility];  Service: Vascular;  Laterality: Left;  PERIPHERAL NERVE BLOCK  . BASCILIC VEIN TRANSPOSITION Left 12/11/2019   Procedure: 1ST STAGE BASILIC TRANSPOSITION OF LEFT ARM;  Surgeon: Angelia Mould, MD;  Location: [facility];  Service: Vascular;  Laterality: Left;  . IR FLUORO GUIDE CV LINE RIGHT  12/09/2019  . IR US GUIDE VASC ACCESS RIGHT  12/09/2019  . LEFT HEART CATH AND CORONARY ANGIOGRAPHY N/A  12/09/2019   Procedure: LEFT HEART CATH AND CORONARY ANGIOGRAPHY;  Surgeon: Adrian Prows, MD;  Location: [facility];  Service: Cardiovascular;  Laterality: N/A;    Family History  Problem Relation Age of Onset  . Bipolar disorder Father   . Polycystic kidney disease Neg Hx     Social History:  reports that he quit smoking about 7 years ago. His smoking use included cigarettes. He has a 2.00 pack-year smoking history. He has never used smokeless tobacco. He reports previous alcohol use. He reports that he does not use drugs.  Allergies: No Known Allergies  Medications: I have reviewed the patient's current medications.  Results for orders placed or performed during the hospital encounter of 07/04/20 (from the past 48 hour(s))  CBC with Differential     Status: Abnormal   Collection Time: 07/04/20 12:34 PM  Result Value Ref Range   WBC 9.9 4.0 - 10.5 K/uL   RBC 4.07 (L) 4.22 - 5.81 MIL/uL   Hemoglobin 11.5 (L) 13.0 - 17.0 g/dL   HCT 36.9 (L) 39.0 - 52.0 %   MCV 90.7 80.0 - 100.0 fL   MCH 28.3 26.0 - 34.0 pg   MCHC 31.2 30.0 - 36.0 g/dL   RDW 14.6 11.5 - 15.5 %   Platelets 231 150 - 400 K/uL   nRBC 0.0 0.0 - 0.2 %   Neutrophils Relative % 65 %   Neutro Abs 6.5 1.7 - 7.7 K/uL   Lymphocytes Relative 24 %   Lymphs Abs 2.4 0.7 -  4.0 K/uL   Monocytes Relative 7 %   Monocytes Absolute 0.7 0.1 - 1.0 K/uL   Eosinophils Relative 2 %   Eosinophils Absolute 0.2 0.0 - 0.5 K/uL   Basophils Relative 1 %   Basophils Absolute 0.1 0.0 - 0.1 K/uL   Immature Granulocytes 1 %   Abs Immature Granulocytes 0.06 0.00 - 0.07 K/uL    Comment: Performed at [facility], [place] [address], [place], [place] [address]  Comprehensive metabolic panel     Status: Abnormal   Collection Time: 07/04/20 12:34 PM  Result Value Ref Range   Sodium 136 135 - 145 mmol/L   Potassium 6.9 (HH) 3.5 - 5.1 mmol/L    Comment: NO VISIBLE HEMOLYSIS CRITICAL RESULT CALLED TO, READ BACK BY AND VERIFIED WITH: Michael  BLACK RN [phone] BY A BENNETT    Chloride 96 (L) 98 - 111 mmol/L   CO2 21 (L) 22 - 32 mmol/L   Glucose, Bld 80 70 - 99 mg/dL    Comment: Glucose reference range applies only to samples taken after fasting for at least 8 hours.   BUN 99 (H) 6 - 20 mg/dL   Creatinine, Ser 13.51 (H) 0.61 - 1.24 mg/dL   Calcium 7.2 (L) 8.9 - 10.3 mg/dL   Total Protein 7.7 6.5 - 8.1 g/dL   Albumin 3.8 3.5 - 5.0 g/dL   AST 16 15 - 41 U/L   ALT 16 0 - 44 U/L   Alkaline Phosphatase 41 38 - 126 U/L   Total Bilirubin 0.5 0.3 - 1.2 mg/dL   GFR, Estimated 4 (L) >60 mL/min    Comment: (NOTE) Calculated using the CKD-EPI Creatinine Equation (2021)    Anion gap 19 (H) 5 - 15    Comment: Performed at [facility], [place] [address] [place], [place] [address]  Lipase, blood     Status: Abnormal   Collection Time: 07/04/20 12:34 PM  Result Value Ref Range   Lipase 52 (H) 11 - 51 U/L    Comment: Performed at [facility] [address], [place], [place] [address]  Urinalysis, Routine w reflex microscopic Urine, Clean Catch     Status: Abnormal   Collection Time: 07/04/20 12:38 PM  Result Value Ref Range   Color, Urine STRAW (A) YELLOW   APPearance CLEAR CLEAR   Specific Gravity, Urine 1.011 1.005 - 1.030   pH 8.0 5.0 - 8.0   Glucose, UA NEGATIVE NEGATIVE mg/dL   Hgb urine dipstick NEGATIVE NEGATIVE   Bilirubin Urine NEGATIVE NEGATIVE   Ketones, ur NEGATIVE NEGATIVE mg/dL   Protein, ur 100 (A) NEGATIVE mg/dL   Nitrite NEGATIVE NEGATIVE   Leukocytes,Ua NEGATIVE NEGATIVE   RBC / HPF 0-5 0 - 5 RBC/hpf   WBC, UA 0-5 0 - 5 WBC/hpf   Bacteria, UA NONE SEEN NONE SEEN    Comment: Performed at [facility] [address], [place], [place] [address]  Resp Panel by RT-PCR (Flu A&B, Covid) Nasopharyngeal Swab     Status: None   Collection Time: 07/04/20  1:28 PM   Specimen: Nasopharyngeal Swab; Nasopharyngeal(NP) swabs in vial transport medium  Result Value Ref Range   SARS Coronavirus 2 by RT  PCR NEGATIVE NEGATIVE    Comment: (NOTE) SARS-CoV-2 target nucleic acids are NOT DETECTED.  The SARS-CoV-2 RNA is generally detectable in upper respiratory specimens during the acute phase of infection. The lowest concentration of SARS-CoV-2 viral copies this assay can detect  is 138 copies/mL. A negative result does not preclude SARS-Cov-2 infection and should not be used as the sole basis for treatment or other patient management decisions. A negative result may occur with  improper specimen collection/handling, submission of specimen other than nasopharyngeal swab, presence of viral mutation(s) within the areas targeted by this assay, and inadequate number of viral copies(<138 copies/mL). A negative result must be combined with clinical observations, patient history, and epidemiological information. The expected result is Negative.  Fact Sheet for Patients:  EntrepreneurPulse.com.au  Fact Sheet for Healthcare Providers:  IncredibleEmployment.be  This test is no t yet approved or cleared by the [place] FDA and  has been authorized for detection and/or diagnosis of SARS-CoV-2 by FDA under an Emergency Use Authorization (EUA). This EUA will remain  in effect (meaning this test can be used) for the duration of the COVID-19 declaration under Section 564(b)(1) of the Act, 21 U.S.C.section 360bbb-3(b)(1), unless the authorization is terminated  or revoked sooner.       Influenza A by PCR NEGATIVE NEGATIVE   Influenza B by PCR NEGATIVE NEGATIVE    Comment: (NOTE) The Xpert Xpress SARS-CoV-2/FLU/RSV plus assay is intended as an aid in the diagnosis of influenza from Nasopharyngeal swab specimens and should not be used as a sole basis for treatment. Nasal washings and aspirates are unacceptable for Xpert Xpress SARS-CoV-2/FLU/RSV testing.  Fact Sheet for Patients: EntrepreneurPulse.com.au  Fact Sheet for Healthcare  Providers: IncredibleEmployment.be  This test is not yet approved or cleared by the [place] FDA and has been authorized for detection and/or diagnosis of SARS-CoV-2 by FDA under an Emergency Use Authorization (EUA). This EUA will remain in effect (meaning this test can be used) for the duration of the COVID-19 declaration under Section 564(b)(1) of the Act, 21 U.S.C. section 360bbb-3(b)(1), unless the authorization is terminated or revoked.  Performed at [facility], [place] [address] [place], [place] [address]     CT Head Wo Contrast  Result Date: 07/04/2020 CLINICAL DATA:  Headache, intracranial hemorrhage suspected. EXAM: CT HEAD WITHOUT CONTRAST TECHNIQUE: Contiguous axial images were obtained from the base of the skull through the vertex without intravenous contrast. COMPARISON:  CT head February 02, 2020. FINDINGS: Brain: Approximately 11 mm mixed density right cerebral convexity subdural hemorrhage. Areas of internal hyperdensity are compatible with acute/recent hemorrhage. Approximately 2-3 mm of leftward midline shift at the foramen of [place]. Basal cisterns are patent. No hydrocephalus. No evidence of acute large vascular territory infarct. Vascular: No hyperdense vessel. Skull: No acute fracture. Sinuses/Orbits: Remote right medial orbital wall fracture. Otherwise, unremarkable orbits. Mild polypoid mucosal thickening in the inferior maxillary sinuses. No air-fluid levels. Other: No mastoid effusions. IMPRESSION: Approximately 11 mm mixed density right cerebral convexity subdural hemorrhage. Areas of internal hyperdensity are compatible with acute/recent hemorrhage. Resulting 2-3 mm of leftward midline shift. Findings discussed with Dr. Billy Fischer via telephone at 3:05 PM. Electronically Signed   By: Margaretha Sheffield MD   On: 07/04/2020 15:09    Review of Systems  Constitutional: Negative.   HENT: Negative.   Eyes: Negative for blurred vision, double  vision, photophobia and pain.  Respiratory: Negative.   Cardiovascular: Negative.   Gastrointestinal: Negative.   Genitourinary: Negative.   Musculoskeletal: Negative.   Skin: Negative.   Neurological: Positive for dizziness and headaches. Negative for tingling, tremors, sensory change, speech change, focal weakness, seizures, loss of consciousness and weakness.  Endo/Heme/Allergies: Negative.   Psychiatric/Behavioral: Negative.    Blood pressure (!) 162/88, pulse  85, temperature 97.9 F (36.6 C), temperature source Oral, resp. rate 17, height 6\' 1"  (1.854 m), weight 120.2 kg, SpO2 100 %. Estimated body mass index is 34.96 kg/m as calculated from the following:   Height as of this encounter: 6\' 1"  (1.854 m).   Weight as of this encounter: 120.2 kg.  Physical Exam Vitals reviewed.  Constitutional:      Appearance: He is obese.  HENT:     Head: Normocephalic and atraumatic.     Mouth/Throat:     Mouth: Mucous membranes are moist.     Pharynx: Oropharynx is clear.  Eyes:     Extraocular Movements: Extraocular movements intact.     Pupils: Pupils are equal, round, and reactive to light.  Cardiovascular:     Rate and Rhythm: Normal rate and regular rhythm.  Pulmonary:     Effort: Pulmonary effort is normal. No respiratory distress.  Abdominal:     General: Abdomen is protuberant.  Skin:    General: Skin is warm and dry.     Capillary Refill: Capillary refill takes less than 2 seconds.  Neurological:     General: No focal deficit present.     Mental Status: He is alert and oriented to person, place, and time. Mental status is at baseline.     GCS: GCS eye subscore is 4. GCS verbal subscore is 5. GCS motor subscore is 6.     Cranial Nerves: Cranial nerves are intact.     Sensory: Sensation is intact.     Motor: Motor function is intact.     Coordination: Coordination is intact.  Psychiatric:        Mood and Affect: Mood normal.        Behavior: Behavior normal.      Assessment/Plan: Patient with acute on chronic right cerebral convexity SDH. He is not on any form of anticoagulation. CMP revealed potassium of 6.9. The Hospitalists will be admitting patient for dialysis. Recommend follow up CT head in the morning. SBP goal less than 160.   Viona Gilmore, DNP, AGNP-C Nurse Practitioner  [facility] [place] [address], [address], [place], [place] [address] P: [phone]    F: [phone]  07/04/2020, 4:26 PM

## 2020-07-04 NOTE — ED Notes (Signed)
Dr Billy Fischer notified of K of 6.9

## 2020-07-04 NOTE — ED Notes (Signed)
Transferred to dialysis via stretcher

## 2020-07-04 NOTE — Final Progress Note (Signed)
HD tx achieved as expected and pt is ready to be transferred to his room. Pt is admitted to 2w19 hemodialysis Nurse called [facility] for report he is told that he will be called back in few minutes. Pt is placed in te[place] and  transported to his room.

## 2020-07-04 NOTE — H&P (Signed)
History and Physical    Michael [name] RDE:[id number] DOB: Jan 22, 1976 DOA: 07/04/2020  PCP: [name], FNP Consultants:  Nephrology; [facility]; [facility] - vascular Patient coming from:  Home - lives alone; NOK: [place], [place], [phone]  Chief Complaint:  Headache, abdominal pain  HPI: Michael [name] is a 45 y.o. male with medical history significant of HTN; HLD; ESRD on MWF HD due to polycystic kidney diease; class 1 obesity; and OSA on CPAP presenting with HA and abdominal pain.  He is undergoing transplant evaluation.  Yesterday AM he developed a headache.  He wasn't feeling well and he had abdominal discomfort.  His headache was getting worse and worse and so he decided to come in.  The headache is in both his temples.  +photophobia earlier today.  No visual disturbance.  He vomited this AM and his stool was loose and then was not loose.   ED Course: Headache - SDH with ?trauma.  Worsened yesterday.  N/V/D, K+ 6.9.  No focal findings on exam.  EKG unremarkable.  Given insulin, Ca++, dextrose.  Nephrology will arrange for HD.  Neurosurgery consult pending.  Review of Systems: As per HPI; otherwise review of systems reviewed and negative.   Ambulatory Status:  Ambulates without assistance  COVID Vaccine Status:  Complete, no booster  Past Medical History:  Diagnosis Date  . Gout   . History of renal dialysis    M-W-F  . HLD (hyperlipidemia)   . Hypertension   . Pneumonia   . Polycystic kidney disease   . Sleep apnea 07/29/2018   uses cpap nightly  . Vitamin D deficiency 11/2018    Past Surgical History:  Procedure Laterality Date  . AV FISTULA PLACEMENT Left 03/24/2020   Procedure: INSERTION OF LEFT UPPER EXTREMITY ARTERIOVENOUS (AV) GORE-TEX GRAFT;  Surgeon: [name], MD;  Location: [facility];  Service: Vascular;  Laterality: Left;  PERIPHERAL NERVE BLOCK  . BASCILIC VEIN TRANSPOSITION Left 12/11/2019   Procedure: 1ST STAGE BASILIC TRANSPOSITION OF LEFT  ARM;  Surgeon: Angelia Mould, MD;  Location: [facility];  Service: Vascular;  Laterality: Left;  . IR FLUORO GUIDE CV LINE RIGHT  12/09/2019  . IR US GUIDE VASC ACCESS RIGHT  12/09/2019  . LEFT HEART CATH AND CORONARY ANGIOGRAPHY N/A 12/09/2019   Procedure: LEFT HEART CATH AND CORONARY ANGIOGRAPHY;  Surgeon: Adrian Prows, MD;  Location: [facility];  Service: Cardiovascular;  Laterality: N/A;    Social History   Socioeconomic History  . Marital status: Single    Spouse name: Not on file  . Number of children: Not on file  . Years of education: Not on file  . Highest education level: Not on file  Occupational History  . Occupation: [employer]  Tobacco Use  . Smoking status: Former Smoker    Packs/day: 0.20    Years: 10.00    Pack years: 2.00    Types: Cigarettes    Quit date: 2015    Years since quitting: 7.2  . Smokeless tobacco: Never Used  Vaping Use  . Vaping Use: Never used  Substance and Sexual Activity  . Alcohol use: Not Currently    Comment: occasional wine  . Drug use: No  . Sexual activity: Yes  Other Topics Concern  . Not on file  Social History Narrative  . Not on file   Social Determinants of Health   Financial Resource Strain: Not on file  Food Insecurity: Not on file  Transportation Needs: Not  on file  Physical Activity: Not on file  Stress: Not on file  Social Connections: Not on file  Intimate Partner Violence: Not on file    No Known Allergies  Family History  Problem Relation Age of Onset  . Bipolar disorder Father   . Polycystic kidney disease Neg Hx     Prior to Admission medications   Medication Sig Start Date End Date Taking? Authorizing Provider  acetaminophen (TYLENOL) 650 MG CR tablet Take 650 mg by mouth daily.    [provider]  allopurinol (ZYLOPRIM) 100 MG tablet Take 100 mg by mouth 2 (two) times daily as needed (Gout).    [provider]  aspirin EC 81 MG EC tablet Take 1 tablet (81 mg total) by mouth  daily. Swallow whole. 12/15/19   Little Ishikawa, MD  CALCIUM ACETATE, PHOS BINDER, PO Take by mouth.    [provider]  camphor-menthol Timoteo Ace) lotion Apply topically as needed for itching. Patient taking differently: Apply 1 application topically as needed for itching. 12/02/19   Samuella Cota, MD  Multiple Vitamin (MULTIVITAMIN PO) Take by mouth.    [provider]  isosorbide dinitrate (ISORDIL) 10 MG tablet Take 1 tablet (10 mg total) by mouth 2 (two) times daily. Patient not taking: No sig reported 02/04/20 03/24/20  [name], FNP    Physical Exam: Vitals:   07/04/20 1654 07/04/20 1700 07/04/20 1730 07/04/20 1800  BP: 125/66 (!) 141/77 (!) 163/70 (!) 180/88  Pulse:      Resp: (!) 21 18 (!) 21 17  Temp:      TempSrc:      SpO2:      Weight:      Height:         . General:  Appears calm and comfortable and is in NAD . Eyes:  PERRL, EOMI, normal lids, iris . ENT:  grossly normal hearing, lips & tongue, mmm; appropriate dentition . Neck:  no LAD, masses or thyromegaly . Cardiovascular:  RRR, no m/r/g. No LE edema.  Marland Kitchen Respiratory:   CTA bilaterally with no wheezes/rales/rhonchi.  Normal respiratory effort. . Abdomen:  soft, NT, ND . Skin:  no rash or induration seen on limited exam . Musculoskeletal:  grossly normal tone BUE/BLE, good ROM, no bony abnormality . Psychiatric:  grossly normal mood and affect, speech fluent and appropriate, AOx3 . Neurologic:  CN 2-12 grossly intact, moves all extremities in coordinated fashion    Radiological Exams on Admission: Independently reviewed - see discussion in A/P where applicable  CT Head Wo Contrast  Result Date: 07/04/2020 CLINICAL DATA:  Headache, intracranial hemorrhage suspected. EXAM: CT HEAD WITHOUT CONTRAST TECHNIQUE: Contiguous axial images were obtained from the base of the skull through the vertex without intravenous contrast. COMPARISON:  CT head February 02, 2020. FINDINGS: Brain:  Approximately 11 mm mixed density right cerebral convexity subdural hemorrhage. Areas of internal hyperdensity are compatible with acute/recent hemorrhage. Approximately 2-3 mm of leftward midline shift at the foramen of [place]. Basal cisterns are patent. No hydrocephalus. No evidence of acute large vascular territory infarct. Vascular: No hyperdense vessel. Skull: No acute fracture. Sinuses/Orbits: Remote right medial orbital wall fracture. Otherwise, unremarkable orbits. Mild polypoid mucosal thickening in the inferior maxillary sinuses. No air-fluid levels. Other: No mastoid effusions. IMPRESSION: Approximately 11 mm mixed density right cerebral convexity subdural hemorrhage. Areas of internal hyperdensity are compatible with acute/recent hemorrhage. Resulting 2-3 mm of leftward midline shift. Findings discussed with Dr. Billy Fischer via telephone at  3:05 PM. Electronically Signed   By: Margaretha Sheffield MD   On: 07/04/2020 15:09    EKG: Independently reviewed.  NSR with rate 70; nonspecific ST changes with no evidence of acute ischemia   Labs on Admission: I have personally reviewed the available labs and imaging studies at the time of the admission.  Pertinent labs:   K+ 6.9 BUN 99/Creatinine 13.51/GFR 4 Anion gap 19 WBC 9.9 Hgb 11.5 COVID/flu negative UA: 100 protein   Assessment/Plan Principal Problem:   SDH (subdural hematoma) (HCC) Active Problems:   Adult polycystic kidney disease   Class 1 obesity due to excess calories with serious comorbidity and body mass index (BMI) of 34.0 to 34.9 in adult   ESRD on hemodialysis ([place])   Subdural hematoma -Patient without obvious recent trauma presenting with headache -CT with 11 mm mixed density SDH  -For now, will observe in neuro-progressive unit -Repeat CT tomorrow  -Per neurosurgery, surgery is not urgently needed at this time; they are following -SBP goal <160  ESRD on HD -Patient on chronic MWF HD -Nephrology prn order set  utilized -He does not appear to be volume overloaded but has hyperkalemia and so needs acute HD -Nephrology notified that patient will need HD -Continue Phos[place] -Undergoing transplant evaluation since PCKD will be cured with transplant  Hyperkalemia -Transient treatment with calcium gluconate, insulin/dextrose -Definitive treatment with HD  OSA -Hold CPAP in the setting of head bleed  Obesity -Body mass index is 34.96 kg/m..  -Weight loss should be encouraged -Outpatient PCP/bariatric medicine/bariatric surgery f/u encouraged     Note: This patient has been tested and is negative for the novel coronavirus COVID-19. The patient has been fully vaccinated against COVID-19.   Level of care: Progressive DVT prophylaxis: SCDs Code Status:   Full - confirmed with patient Family Communication: None present Disposition Plan:  The patient is from: home  Anticipated d/c is to: home without [facility] services Anticipated d/c date will depend on clinical response to treatment, but possibly as early as tomorrow if she has excellent response to treatment  Patient is currently: acutely ill Consults called: Neurosurgery; nephrology Admission status:  It is my clinical opinion that referral for OBSERVATION is reasonable and necessary in this patient based on the above information provided. The aforementioned taken together are felt to place the patient at high risk for further clinical deterioration. However it is anticipated that the patient may be medically stable for discharge from the hospital within 24 to 48 hours.    Karmen Bongo MD [facility]   How to contact the [facility] Attending or Consulting provider [place] or covering provider during after hours [place], for this patient?  1. Check the care team in [facility] and look for a) attending/consulting TRH provider listed and b) the [facility] team listed 2. Log into [email or web address] and use [facility]'s universal password to access. If you do not have  the password, please contact the hospital operator. 3. Locate the [facility] provider you are looking for under [facility] and page to a number that you can be directly reached. 4. If you still have difficulty reaching the provider, please page the [facility] (Director on Call) for the Hospitalists listed on amion for assistance.   07/04/2020, 6:34 PM

## 2020-07-04 NOTE — ED Notes (Signed)
Patient transported to CT via CT tech

## 2020-07-04 NOTE — ED Provider Notes (Signed)
[facility] EMERGENCY DEPARTMENT Provider Note   CSN: [id number] Arrival date & time: 07/04/20  1113     History Chief Complaint  Patient presents with  . Abdominal Pain  . Headache    Went to dialysis to have treatment, states HA and abd pain to bad to have dialysis    Michael [name] is a 45 y.o. male.  HPI     45 year old male with history of polycystic kidney disease, hypertension, hyperlipidemia, ESRD on dialysis Monday Wednesday Friday, presents with concern for headache, nausea, vomiting, diarrhea and abdominal pain.  Reports that the headache began yesterday morning and increased slowly throughout the day.  It is located behind his eyes with radiation, severe, worse with bright lights.  No history of similar headache in the past.  Reports he "feel like I been hit in the head."  Reports he did have some head trauma approximately 5 weeks ago with no loss of consciousness, and that he intermittently will hit his head on the side of the car as he gets in and out, but denies that he significant head trauma.  He was exceptionally receiving heparin at dialysis.  He has had associated nausea and vomiting that started this morning.  He also had diarrhea today. Initially constipation yesterday but had diarrhea starting late last night and had 6 episodes and has not had any diarrhea since then.  Reports diffuse abdominal pain.  Denies fevers, cough, congestion.  Reports he was unable to fit missed dialysis on Friday due to feeling lightheaded and having low blood pressures, and was unable to be dialyzed today because he was feeling sick.  Denies other body aches, sore throat  Past Medical History:  Diagnosis Date  . Gout   . History of renal dialysis    M-W-F  . HLD (hyperlipidemia)   . Hypertension   . Pneumonia   . Polycystic kidney disease   . Sleep apnea 07/29/2018   uses cpap nightly  . Vitamin D deficiency 11/2018    Patient Active Problem List   Diagnosis  Date Noted  . SDH (subdural hematoma) ([place]) 07/04/2020  . AKI (acute kidney injury) ([place]) 12/07/2019  . Uremia 12/02/2019  . Chest pain 12/02/2019  . Elevated troponin 12/02/2019  . Anemia in CKD (chronic kidney disease) 12/02/2019  . Adjustment disorder with mixed anxiety and depressed mood 11/28/2019  . Renal failure 11/25/2019  . Hyperkalemia 11/25/2019  . Normocytic anemia 11/25/2019  . Metabolic acidosis, increased anion gap   . Hematochezia   . Chronic gout due to renal impairment without tophus 12/14/2018  . Polycystic kidney disease 11/09/2016  . ESRD on hemodialysis ([place]) 11/09/2016  . Observed sleep apnea 09/01/2016  . Excessive daytime sleepiness 09/01/2016  . Primary snoring 09/01/2016  . Acute pyelonephritis 05/25/2016  . Acute gouty arthritis 05/25/2016  . Hematuria 05/25/2016  . Essential hypertension, benign 05/25/2016  . Class 1 obesity due to excess calories with serious comorbidity and body mass index (BMI) of 34.0 to 34.9 in adult 05/25/2016  . Hypertensive crisis 05/23/2016  . Adult polycystic kidney disease 05/23/2016  . SIRS (systemic inflammatory response syndrome) ([place]) 05/23/2016    Past Surgical History:  Procedure Laterality Date  . AV FISTULA PLACEMENT Left 03/24/2020   Procedure: INSERTION OF LEFT UPPER EXTREMITY ARTERIOVENOUS (AV) GORE-TEX GRAFT;  Surgeon: [name], MD;  Location: [facility];  Service: Vascular;  Laterality: Left;  PERIPHERAL NERVE BLOCK  . BASCILIC VEIN TRANSPOSITION Left 12/11/2019   Procedure: 1ST STAGE  BASILIC TRANSPOSITION OF LEFT ARM;  Surgeon: Angelia Mould, MD;  Location: [facility];  Service: Vascular;  Laterality: Left;  . IR FLUORO GUIDE CV LINE RIGHT  12/09/2019  . IR US GUIDE VASC ACCESS RIGHT  12/09/2019  . LEFT HEART CATH AND CORONARY ANGIOGRAPHY N/A 12/09/2019   Procedure: LEFT HEART CATH AND CORONARY ANGIOGRAPHY;  Surgeon: Adrian Prows, MD;  Location: [facility];  Service: Cardiovascular;  Laterality: N/A;        Family History  Problem Relation Age of Onset  . Bipolar disorder Father   . Polycystic kidney disease Neg Hx     Social History   Tobacco Use  . Smoking status: Former Smoker    Packs/day: 0.20    Years: 10.00    Pack years: 2.00    Types: Cigarettes    Quit date: 2015    Years since quitting: 7.2  . Smokeless tobacco: Never Used  Vaping Use  . Vaping Use: Never used  Substance Use Topics  . Alcohol use: Not Currently    Comment: occasional wine  . Drug use: No    Home Medications Prior to Admission medications   Medication Sig Start Date End Date Taking? Authorizing Provider  allopurinol (ZYLOPRIM) 100 MG tablet Take 100 mg by mouth 2 (two) times daily as needed (Gout).   Yes [provider]  Ascorbic Acid (VITAMIN C GUMMIE PO) Take 1 tablet by mouth every morning.   Yes [provider]  aspirin 500 MG EC tablet Take 500 mg by mouth daily as needed for pain (headache).   Yes [provider]  calcium acetate (PHOSLO) 667 MG capsule Take 2,001 mg by mouth 3 (three) times daily with meals.   Yes [provider]  lidocaine-prilocaine (EMLA) cream Apply 1 application topically See admin instructions. Apply topically to port access one hour prior to dialysis - Monday, Wednesday, Friday   Yes [provider]  Multiple Vitamin (MULTIVITAMIN WITH MINERALS) TABS tablet Take 1 tablet by mouth every morning.   Yes [provider]  PRESCRIPTION MEDICATION Inhale into the lungs at bedtime. CPAP   Yes [provider]  isosorbide dinitrate (ISORDIL) 10 MG tablet Take 1 tablet (10 mg total) by mouth 2 (two) times daily. Patient not taking: No sig reported 02/04/20 03/24/20  [name], FNP    Allergies    Patient has no known allergies.  Review of Systems   Review of Systems  Constitutional: Negative for fever.  HENT: Negative for sore throat.   Eyes: Negative for visual disturbance.  Respiratory: Negative  for shortness of breath.   Cardiovascular: Negative for chest pain.  Gastrointestinal: Positive for abdominal pain, diarrhea, nausea and vomiting.  Genitourinary: Negative for difficulty urinating.  Musculoskeletal: Negative for back pain and neck stiffness.  Skin: Negative for rash.  Neurological: Positive for headaches. Negative for dizziness, syncope, facial asymmetry, speech difficulty, weakness and numbness.    Physical Exam Updated Vital Signs BP (!) 147/89 (BP Location: Right Arm)   Pulse 98   Temp 98.4 F (36.9 C) (Oral)   Resp (!) 22   Ht 6\' 1"  (1.854 m)   Wt 120.2 kg   SpO2 99%   BMI 34.96 kg/m   Physical Exam Vitals and nursing note reviewed.  Constitutional:      General: He is not in acute distress.    Appearance: Normal appearance. He is well-developed. He is not ill-appearing or diaphoretic.  HENT:     Head:  Normocephalic and atraumatic.  Eyes:     General: No visual field deficit.    Extraocular Movements: Extraocular movements intact.     Conjunctiva/sclera: Conjunctivae normal.     Pupils: Pupils are equal, round, and reactive to light.  Cardiovascular:     Rate and Rhythm: Normal rate and regular rhythm.     Pulses: Normal pulses.     Heart sounds: Normal heart sounds. No murmur heard. No friction rub. No gallop.   Pulmonary:     Effort: Pulmonary effort is normal. No respiratory distress.     Breath sounds: Normal breath sounds. No wheezing or rales.  Abdominal:     General: There is no distension.     Palpations: Abdomen is soft.     Tenderness: There is no abdominal tenderness. There is no guarding.  Musculoskeletal:        General: No swelling or tenderness.     Cervical back: Normal range of motion.  Skin:    General: Skin is warm and dry.     Findings: No erythema or rash.  Neurological:     General: No focal deficit present.     Mental Status: He is alert and oriented to person, place, and time.     GCS: GCS eye subscore is 4. GCS  verbal subscore is 5. GCS motor subscore is 6.     Cranial Nerves: No cranial nerve deficit, dysarthria or facial asymmetry.     Sensory: No sensory deficit.     Motor: No weakness or tremor.     Coordination: Coordination normal. Finger-Nose-Finger Test normal.     ED Results / Procedures / Treatments   Labs (all labs ordered are listed, but only abnormal results are displayed) Labs Reviewed  CBC WITH DIFFERENTIAL/PLATELET - Abnormal; Notable for the following components:      Result Value   RBC 4.07 (*)    Hemoglobin 11.5 (*)    HCT 36.9 (*)    All other components within normal limits  COMPREHENSIVE METABOLIC PANEL - Abnormal; Notable for the following components:   Potassium 6.9 (*)    Chloride 96 (*)    CO2 21 (*)    BUN 99 (*)    Creatinine, Ser 13.51 (*)    Calcium 7.2 (*)    GFR, Estimated 4 (*)    Anion gap 19 (*)    All other components within normal limits  LIPASE, BLOOD - Abnormal; Notable for the following components:   Lipase 52 (*)    All other components within normal limits  URINALYSIS, ROUTINE W REFLEX MICROSCOPIC - Abnormal; Notable for the following components:   Color, Urine STRAW (*)    Protein, ur 100 (*)    All other components within normal limits  RESP PANEL BY RT-PCR (FLU A&B, COVID) [id number]  BASIC METABOLIC PANEL  CBC    EKG EKG Interpretation  Date/Time:  Monday July 04 2020 14:01:39 EDT Ventricular Rate:  70 PR Interval:  161 QRS Duration: 92 QT Interval:  399 QTC Calculation: 431 R Axis:   42 Text Interpretation: Sinus rhythm Consider left ventricular hypertrophy Borderline T abnormalities, inferior leads Similar to prior ECG Confirmed by Gareth Morgan [id number]) on 07/04/2020 2:11:16 PM   Radiology CT Head Wo Contrast  Result Date: 07/04/2020 CLINICAL DATA:  Headache, intracranial hemorrhage suspected. EXAM: CT HEAD WITHOUT CONTRAST TECHNIQUE: Contiguous axial images were obtained from the base of the skull through the vertex  without intravenous contrast. COMPARISON:  CT head February 02, 2020. FINDINGS: Brain: Approximately 11 mm mixed density right cerebral convexity subdural hemorrhage. Areas of internal hyperdensity are compatible with acute/recent hemorrhage. Approximately 2-3 mm of leftward midline shift at the foramen of [place]. Basal cisterns are patent. No hydrocephalus. No evidence of acute large vascular territory infarct. Vascular: No hyperdense vessel. Skull: No acute fracture. Sinuses/Orbits: Remote right medial orbital wall fracture. Otherwise, unremarkable orbits. Mild polypoid mucosal thickening in the inferior maxillary sinuses. No air-fluid levels. Other: No mastoid effusions. IMPRESSION: Approximately 11 mm mixed density right cerebral convexity subdural hemorrhage. Areas of internal hyperdensity are compatible with acute/recent hemorrhage. Resulting 2-3 mm of leftward midline shift. Findings discussed with Dr. Billy Fischer via telephone at 3:05 PM. Electronically Signed   By: Margaretha Sheffield MD   On: 07/04/2020 15:09    Procedures .Critical Care Performed by: Gareth Morgan, MD Authorized by: Gareth Morgan, MD   Critical care provider statement:    Critical care time (minutes):  75   Critical care was time spent personally by me on the following activities:  Discussions with consultants, evaluation of patient's response to treatment, examination of patient, ordering and performing treatments and interventions, ordering and review of laboratory studies, ordering and review of radiographic studies, pulse oximetry, re-evaluation of patient's condition, obtaining history from patient or surrogate and review of old charts     Medications Ordered in ED Medications  Chlorhexidine Gluconate Cloth 2 % PADS 6 each (has no administration in time range)  doxercalciferol ([name]) injection 6 mcg (has no administration in time range)  acetaminophen (TYLENOL) tablet 650 mg (has no administration in time  range)    Or  acetaminophen (TYLENOL) suppository 650 mg (has no administration in time range)  zolpidem (AMBIEN) tablet 5 mg (has no administration in time range)  sorbitol 70 % solution 30 mL (has no administration in time range)  docusate sodium (ENEMEEZ) enema 283 mg (has no administration in time range)  ondansetron (ZOFRAN) tablet 4 mg (has no administration in time range)    Or  ondansetron (ZOFRAN) injection 4 mg (has no administration in time range)  camphor-menthol (SARNA) lotion 1 application (has no administration in time range)    And  hydrOXYzine (ATARAX/VISTARIL) tablet 25 mg (has no administration in time range)  calcium carbonate (dosed in mg elemental calcium) suspension 500 mg of elemental calcium (has no administration in time range)  feeding supplement (NEPRO CARB STEADY) liquid 237 mL (has no administration in time range)  sodium chloride flush (NS) 0.9 % injection 3 mL (has no administration in time range)  HYDROcodone-acetaminophen (NORCO/VICODIN) 5-325 MG per tablet 1-2 tablet (has no administration in time range)  calcium acetate (PHOSLO) capsule 2,001 mg (has no administration in time range)  hydrALAZINE (APRESOLINE) injection 5 mg (has no administration in time range)  prochlorperazine (COMPAZINE) injection 10 mg (10 mg Intravenous Given 07/04/20 1359)  diphenhydrAMINE (BENADRYL) injection 25 mg (25 mg Intravenous Given 07/04/20 1357)  calcium gluconate 1 g/ 50 mL sodium chloride IVPB (0 g Intravenous Stopped 07/04/20 1510)  insulin aspart (novoLOG) injection 5 Units (5 Units Intravenous Given 07/04/20 1413)  dextrose 50 % solution 50 mL (50 mLs Intravenous Given 07/04/20 1410)    ED Course  I have reviewed the triage vital signs and the nursing notes.  Pertinent labs & imaging results that were available during my care of the patient were reviewed by me and considered in my medical decision making (see chart for details).    MDM Rules/Calculators/A&P  45 year old male with history of polycystic kidney disease, hypertension, hyperlipidemia, ESRD on dialysis Monday Wednesday Friday, presents with concern for headache, nausea, vomiting, diarrhea and abdominal pain.  Abdominal exam nonfocal, pt with diarrhea, doubt SBO, appendicitis, cholecystitis. Suspect pain related to gastroenteritis.    CT head with subdural hematoma, mixed density. Normal neurologic exam.  Consulted Neurosurgery reagrding finding and [facility]/Dr. Arnoldo Morale to evaluate.   Labs significant for hyperkalemia with k of 6.9. Treated with insulin, dextrose, Ca. Discussed with Nephrology Dr. Jonnie Finner, plan for dialysis. Admitted for further care.  Final Clinical Impression(s) / ED Diagnoses Final diagnoses:  Subdural hematoma ([place])  Hyperkalemia    Rx / DC Orders ED Discharge Orders    None       Gareth Morgan, MD 07/04/20 2218

## 2020-07-04 NOTE — Progress Notes (Signed)
The nurse ([id number])  just called for report.

## 2020-07-04 NOTE — ED Triage Notes (Signed)
From dialysis with c/o HA and abd pain, did not receive his dialysis

## 2020-07-04 NOTE — Consult Note (Addendum)
[facility] Renal Consultation Note    Indication for Consultation:  Management of ESRD/hemodialysis, anemia, hypertension/volume, and secondary hyperparathyroidism.  HPI: Michael [name] is a 45 y.o. male with PMH including ESRD on dialysis MWF, polycystic kidney disease, HTN and sleep apnea who presents to the ED today with nausea, vomiting and migraine-like headache. Pt reports he went to dialysis earlier this morning but had a severe headache and vomited when he arrived, and did not feel he could tolerate outpatient dialysis. Pt reports headache started yesterday and was severe, improving now after pain medication. He reports nausea and vomiting started this morning. No diarrhea, melena, fevers or chills. Abdominal pain is also resolving with pain medication in the ED. He denies SOB, CP, palpitations, dizziness, and edema. Labs notable for K+ 6.9, Cr 13.51, Ca 7.2, Alb 3.8, BUN 99. Nephrology was consulted for urgent dialysis. Pt reports he did complete his full dialysis on Friday. Potassium tends to run on the high side outpatient and he reports higher potassium diet over the weekend, including drinking orange juice. He has no muscle twitching or weakness at present. VSS with moderate HTN.   Past Medical History:  Diagnosis Date  . CKD (chronic kidney disease)    M-W-F  . Gout   . History of renal dialysis    M-W-F  . HLD (hyperlipidemia)   . Hypertension   . Pneumonia   . Polycystic kidney disease   . Sleep apnea 07/29/2018   uses cpap nightly  . Vitamin D deficiency 11/2018   Past Surgical History:  Procedure Laterality Date  . AV FISTULA PLACEMENT Left 03/24/2020   Procedure: INSERTION OF LEFT UPPER EXTREMITY ARTERIOVENOUS (AV) GORE-TEX GRAFT;  Surgeon: [name], MD;  Location: [facility];  Service: Vascular;  Laterality: Left;  PERIPHERAL NERVE BLOCK  . BASCILIC VEIN TRANSPOSITION Left 12/11/2019   Procedure: 1ST STAGE BASILIC TRANSPOSITION OF LEFT ARM;  Surgeon:  Angelia Mould, MD;  Location: [facility];  Service: Vascular;  Laterality: Left;  . IR FLUORO GUIDE CV LINE RIGHT  12/09/2019  . IR US GUIDE VASC ACCESS RIGHT  12/09/2019  . LEFT HEART CATH AND CORONARY ANGIOGRAPHY N/A 12/09/2019   Procedure: LEFT HEART CATH AND CORONARY ANGIOGRAPHY;  Surgeon: Adrian Prows, MD;  Location: [facility];  Service: Cardiovascular;  Laterality: N/A;   Family History  Problem Relation Age of Onset  . Bipolar disorder Father    Social History:  reports that he quit smoking about 7 years ago. His smoking use included cigarettes. He has never used smokeless tobacco. He reports previous alcohol use. He reports that he does not use drugs.  ROS: As per HPI otherwise negative.  Physical Exam: Vitals:   07/04/20 1300 07/04/20 1315 07/04/20 1330 07/04/20 1400  BP: 127/72 (!) 130/92 126/81 131/70  Pulse: 83 73 69 74  Resp: 14 (!) 21 14 14   Temp:      TempSrc:      SpO2: 100% 100% 100% 100%  Weight:      Height:         General: Well developed, well nourished, in no acute distress. Head: Normocephalic, atraumatic, sclera non-icteric, mucus membranes are moist. Neck: JVD not elevated. Lungs: Clear bilaterally to auscultation without wheezes, rales, or rhonchi. Breathing is unlabored. Heart: RRR with normal S1, S2. No murmurs, rubs, or gallops appreciated. Abdomen: Soft, non-tender, non-distended with normoactive bowel sounds. No rebound/guarding. No obvious abdominal masses. Musculoskeletal:  Strength and tone appear normal for age. Lower extremities:  No edema or ischemic changes, no open wounds. Neuro: Alert and oriented X 3. Moves all extremities spontaneously. Psych:  Responds to questions appropriately with a normal affect. Dialysis Access: LUE AVG + bruit, wrapped in plastic wrap  No Known Allergies Prior to Admission medications   Medication Sig Start Date End Date Taking? Authorizing Provider  acetaminophen (TYLENOL) 650 MG CR tablet Take 650 mg  by mouth daily.    [provider]  allopurinol (ZYLOPRIM) 100 MG tablet Take 100 mg by mouth 2 (two) times daily as needed (Gout).    [provider]  aspirin EC 81 MG EC tablet Take 1 tablet (81 mg total) by mouth daily. Swallow whole. 12/15/19   Little Ishikawa, MD  CALCIUM ACETATE, PHOS BINDER, PO Take by mouth.    [provider]  camphor-menthol Timoteo Ace) lotion Apply topically as needed for itching. Patient taking differently: Apply 1 application topically as needed for itching. 12/02/19   Samuella Cota, MD  Multiple Vitamin (MULTIVITAMIN PO) Take by mouth.    [provider]  isosorbide dinitrate (ISORDIL) 10 MG tablet Take 1 tablet (10 mg total) by mouth 2 (two) times daily. Patient not taking: No sig reported 02/04/20 03/24/20  [name], FNP   Current Facility-Administered Medications  Medication Dose Route Frequency Provider Last Rate Last Admin  . calcium gluconate 1 g/ 50 mL sodium chloride IVPB  1 g Intravenous Once Gareth Morgan, MD 50 mL/hr at 07/04/20 1409 1,000 mg at 07/04/20 1409  . [START ON 07/05/2020] Chlorhexidine Gluconate Cloth 2 % PADS 6 each  6 each Topical Q0600 Janalee Dane, PA-C       Current Outpatient Medications  Medication Sig Dispense Refill  . acetaminophen (TYLENOL) 650 MG CR tablet Take 650 mg by mouth daily.    Marland Kitchen allopurinol (ZYLOPRIM) 100 MG tablet Take 100 mg by mouth 2 (two) times daily as needed (Gout).    Marland Kitchen aspirin EC 81 MG EC tablet Take 1 tablet (81 mg total) by mouth daily. Swallow whole. 30 tablet 11  . CALCIUM ACETATE, PHOS BINDER, PO Take by mouth.    . camphor-menthol (SARNA) lotion Apply topically as needed for itching. (Patient taking differently: Apply 1 application topically as needed for itching.) 222 mL 1  . Multiple Vitamin (MULTIVITAMIN PO) Take by mouth.     Labs: Basic Metabolic Panel: Recent Labs  Lab 07/04/20 1234  NA 136  K 6.9*  CL 96*  CO2 21*  GLUCOSE 80  BUN  99*  CREATININE 13.51*  CALCIUM 7.2*   Liver Function Tests: Recent Labs  Lab 07/04/20 1234  AST 16  ALT 16  ALKPHOS 41  BILITOT 0.5  PROT 7.7  ALBUMIN 3.8   Recent Labs  Lab 07/04/20 1234  LIPASE 52*   CBC: Recent Labs  Lab 07/04/20 1234  WBC 9.9  NEUTROABS 6.5  HGB 11.5*  HCT 36.9*  MCV 90.7  PLT 231    Outpatient Dialysis Orders: Center: [facility] on MWF. 180NRe, Time: 4:15 hr, BFR 450, DFR auto 1.5, EDW 115.5kg, 2K, 2Ca, AVG 15g, heparin 5000 unit bolus [name] 6 mcg IV q HD Binder: calcium acetate 3 tabs PO TID with meals, renvela 2 tabs PO TID with meals   Assessment/Plan: 1.  Hyperkalemia: K+ 6.9 on admission. Pt reports dietary indiscretions over there weekend. We have arranged for urgent HD today. Reinforced low K diet. Calcium gluconate, insulin and dextrose given in the ED.  2. Migraine/Nausea/Vomiting: All symptoms  improving at present. Viral panel pending. Management per primary team.   3.  ESRD:  MWF schedule via AVG, urgent HD today due to hyperkalemia as above. 4.  Hypertension/volume: BP moderately elevated. Respiratory status stable. Pt significantly over EDW due to large fluid gains, reinforced fluid restrictions. UF with HD today as tolerated 5.  Anemia: Hgb at goal, no active bleeding reported. No ESA indicated at this time 6.  Metabolic bone disease: Calcium slightly low. Continue calcium acetate and renvela binders once tolerating PO. Will continue hectorol with HD 7.  Nutrition:  Will need renal diet/fluid restrictions once tolerating PO intake  Addendum: CT head showed recent subdural hemorrhage. HD orders modified with NO HEPARIN. HD charge nurse aware.   Anice Paganini, PA-C 07/04/2020, 2:32 PM  [facility] Pager: [phone]

## 2020-07-04 NOTE — Procedures (Signed)
   I was present at this dialysis session, have reviewed the session itself and made  appropriate changes Kelly Splinter MD [place] pager [phone]   07/04/2020, 5:26 PM

## 2020-07-05 ENCOUNTER — Observation Stay (HOSPITAL_COMMUNITY): Payer: 59

## 2020-07-05 DIAGNOSIS — S065X9A Traumatic subdural hemorrhage with loss of consciousness of unspecified duration, initial encounter: Secondary | ICD-10-CM | POA: Diagnosis not present

## 2020-07-05 LAB — BASIC METABOLIC PANEL
Anion gap: 14 (ref 5–15)
BUN: 44 mg/dL — ABNORMAL HIGH (ref 6–20)
CO2: 28 mmol/L (ref 22–32)
Calcium: 8.2 mg/dL — ABNORMAL LOW (ref 8.9–10.3)
Chloride: 93 mmol/L — ABNORMAL LOW (ref 98–111)
Creatinine, Ser: 8.78 mg/dL — ABNORMAL HIGH (ref 0.61–1.24)
GFR, Estimated: 7 mL/min — ABNORMAL LOW (ref >60)
Glucose, Bld: 116 mg/dL — ABNORMAL HIGH (ref 70–99)
Potassium: 4.8 mmol/L (ref 3.5–5.1)
Sodium: 135 mmol/L (ref 135–145)

## 2020-07-05 LAB — CBC
HCT: 36.1 % — ABNORMAL LOW (ref 39.0–52.0)
Hemoglobin: 11.7 g/dL — ABNORMAL LOW (ref 13.0–17.0)
MCH: 28.1 pg (ref 26.0–34.0)
MCHC: 32.4 g/dL (ref 30.0–36.0)
MCV: 86.6 fL (ref 80.0–100.0)
Platelets: 252 10*3/uL (ref 150–400)
RBC: 4.17 MIL/uL — ABNORMAL LOW (ref 4.22–5.81)
RDW: 14.6 % (ref 11.5–15.5)
WBC: 10.3 10*3/uL (ref 4.0–10.5)
nRBC: 0 % (ref 0.0–0.2)

## 2020-07-05 NOTE — Progress Notes (Signed)
I have reviewed the patient's follow-up head CT performed today.  It demonstrates no change in his small right subdural hematoma with mild mass-effect.  I will sign off.  Please have him follow-up with me in my office in a week or 2.

## 2020-07-05 NOTE — Care Management Obs Status (Addendum)
[facility] NOTIFICATION   Patient Details  Name: Michael [name] MRN: [id number] Date of Birth: [birth year]   Medicare Observation Status Notification Given:  Yes   Pt asks for assistance with CPAP, states it is still working but dirty and he can't call the company who provided it because they tried to bill him for it and he never paid.  Pt unable to state which company it was.  CSW contacted Freda Munro at [facility], they did provide the CPAP, which is now in asset recovery.  CSW informed her that pt now has medicare/medicaid, may be in position to pay.  She will check into this.    Joanne Chars, LCSW 07/05/2020, 3:58 PM
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
## 2020-07-05 NOTE — Progress Notes (Signed)
[name] Progress Note  Subjective: seen in room, doing well, going home tomorrow. Wants to do OP HD tomorrow.   Vitals:   07/04/20 2225 07/04/20 2350 07/05/20 0744 07/05/20 1100  BP: (!) 161/98 133/84 129/78 (!) 148/67  Pulse: 92 99 95 79  Resp: 20 17 20 19   Temp: 98 F (36.7 C) 98.9 F (37.2 C) 98.7 F (37.1 C) 98.8 F (37.1 C)  TempSrc: Oral Oral Oral Oral  SpO2: 99% 97% 97% 97%  Weight:      Height:        Exam:   alert, nad   no jvd  Chest cta bilat  Cor reg no RG  Abd soft ntnd no ascites   Ext no LE edema   Alert, NF, ox3   LUE AVG+ bruit    OP HD: MWF AF  4h 10min  450/1.5  115.5kg  2/2 bath  AVG 15g  NO HEPARIN DUE TO Subdural hematoma (was on hep 5000)  - hect 6 ug tiw  - calc acetate 3 ac tid, renvela 2 ac tid   Assessment/ Plan: 1. Hyperkalemia: K+ 6.9 on admission. Got HD last night, K+ down 4.8 after HD yesterday.  2. Subdural hematoma: w/ HD, nausea, vomiting. 57mm SDH w/ small shift. Repeat CT stable. No intervention planned per NSurg.  3. OSA - asking for CPAP overnight 4. ESRD: usual HD MWF. NO HEPARIN due to SDH. Pt wants to go to his outpatient unit for HD tomorrow and this is okay from our standpoint. Have d/w attending, plan will be for early dc so he can get to his HD unit for HD at 10- 10:15am.  5. Hypertension/volume: BP's ok. 3 L off yesterday, no gross vol excess on exam. Needs new wt post HD here.  6.  Anemia: Hgb at goal, no active bleeding reported. No ESA indicated at this time 7.  Metabolic bone disease: Calcium slightly low. Continue calcium acetate and renvela binders once tolerating PO. Will continue hectorol with HD 8.  Nutrition:  Will need renal diet/fluid restrictions once tolerating PO intake      [name] 07/05/2020, 12:58 PM   Recent Labs  Lab 07/04/20 1234 07/05/20 0309  K 6.9* 4.8  BUN 99* 44*  CREATININE 13.51* 8.78*  CALCIUM 7.2* 8.2*  HGB 11.5* 11.7*   Inpatient medications: . calcium  acetate  2,001 mg Oral TID WC  . Chlorhexidine Gluconate Cloth  6 each Topical Q0600  . [START ON 07/06/2020] doxercalciferol  6 mcg Intravenous Q M,W,F-HD  . sodium chloride flush  3 mL Intravenous Q12H    acetaminophen **OR** acetaminophen, calcium carbonate (dosed in mg elemental calcium), camphor-menthol **AND** hydrOXYzine, docusate sodium, feeding supplement (NEPRO CARB STEADY), hydrALAZINE, HYDROcodone-acetaminophen, ondansetron **OR** ondansetron (ZOFRAN) IV, sorbitol, zolpidem

## 2020-07-05 NOTE — Progress Notes (Signed)
Patient placed on CPAP for HS using FFM and room air as per home regimen.  Patient unsure on home settings, auto-titration mode used (min 6, max 18).  Patient is familiar with equipment and procedure.

## 2020-07-05 NOTE — Plan of Care (Signed)

## 2020-07-05 NOTE — Progress Notes (Signed)
PROGRESS NOTE    Michael [name]  XFG:[id number] DOB: [birth year] DOA: 07/04/2020 PCP: [name], FNP   Brief Narrative:   HPI: Michael [name] is a 45 y.o. male with medical history significant of HTN; HLD; ESRD on MWF HD due to polycystic kidney diease; class 1 obesity; and OSA on CPAP presenting with HA and abdominal pain.  He is undergoing transplant evaluation.  Yesterday AM he developed a headache.  He wasn't feeling well and he had abdominal discomfort.  His headache was getting worse and worse and so he decided to come in.  The headache is in both his temples.  +photophobia earlier today.  No visual disturbance.  He vomited this AM and his stool was loose and then was not loose.   ED Course: Headache - SDH with ?trauma.  Worsened yesterday.  N/V/D, K+ 6.9.  No focal findings on exam.  EKG unremarkable.  Given insulin, Ca++, dextrose.  Nephrology will arrange for HD.  Neurosurgery consult pending.   Assessment & Plan:   Principal Problem:   SDH (subdural hematoma) (HCC) Active Problems:   Adult polycystic kidney disease   Class 1 obesity due to excess calories with serious comorbidity and body mass index (BMI) of 34.0 to 34.9 in adult   Hyperkalemia   ESRD on hemodialysis (HCC)   Subdural hematoma: -CT with 11 mm mixed density SDH.  Seen by neurosurgery.  Repeat CT head shows stable bleed and midline shift.  However patient still complains of headache and nausea.  Not able to take anything p.o.  Continue symptomatic treatment.  Neurosurgery signed off with recommendation to follow-up as outpatient in 1 week for follow-up scan.  ESRD on HD -Patient on chronic MWF HD.  Nephrology on board.  Hyperkalemia: Resolved.   OSA -Hold CPAP in the setting of head bleed  Obesity: Weight loss counseling provided.  Anemia of chronic disease/renal disease: Hemoglobin is stable.  DVT prophylaxis: SCDs Start: 07/04/20 1557   Code Status: Full Code  Family Communication:  None  present at bedside.  Plan of care discussed with patient in length and he verbalized understanding and agreed with it.  Status is: Observation  The patient will require care spanning > 2 midnights and should be moved to inpatient because: Inpatient level of care appropriate due to severity of illness  Dispo: The patient is from: Home              Anticipated d/c is to: Home              Patient currently is not medically stable to d/c.   Difficult to place patient No        Estimated body mass index is 34.96 kg/m as calculated from the following:   Height as of this encounter: 6\' 1"  (1.854 m).   Weight as of this encounter: 120.2 kg.      Nutritional status:               Consultants:   Neurosurgery  Procedures:   None  Antimicrobials:  Anti-infectives (From admission, onward)   None         Subjective: Seen and examined.  Still complains of bitemporal headache, slightly better than yesterday and still complains of nausea.  Objective: Vitals:   07/04/20 2119 07/04/20 2225 07/04/20 2350 07/05/20 0744  BP: (!) 147/89 (!) 161/98 133/84 129/78  Pulse: 98 92 99 95  Resp: (!) 22 20 17 20   Temp: 98.4 F (36.9 C) 98 F (36.7  C) 98.9 F (37.2 C) 98.7 F (37.1 C)  TempSrc: Oral Oral Oral Oral  SpO2:  99% 97% 97%  Weight:      Height:        Intake/Output Summary (Last 24 hours) at 07/05/2020 1107 Last data filed at 07/05/2020 0534 Gross per 24 hour  Intake 530 ml  Output 3300 ml  Net -2770 ml   Filed Weights   07/04/20 1118  Weight: 120.2 kg    Examination:  General exam: Appears calm and comfortable, morbidly obese Respiratory system: Clear to auscultation. Respiratory effort normal. Cardiovascular system: S1 & S2 heard, RRR. No JVD, murmurs, rubs, gallops or clicks. No pedal edema. Gastrointestinal system: Abdomen is nondistended, soft and nontender. No organomegaly or masses felt. Normal bowel sounds heard. Central nervous system:  Alert and oriented. No focal neurological deficits. Extremities: Symmetric 5 x 5 power. Skin: No rashes, lesions or ulcers Psychiatry: Judgement and insight appear normal. Mood & affect appropriate.    Data Reviewed: I have personally reviewed following labs and imaging studies  CBC: Recent Labs  Lab 07/04/20 1234 07/05/20 0309  WBC 9.9 10.3  NEUTROABS 6.5  --   HGB 11.5* 11.7*  HCT 36.9* 36.1*  MCV 90.7 86.6  PLT 231 696   Basic Metabolic Panel: Recent Labs  Lab 07/04/20 1234 07/05/20 0309  NA 136 135  K 6.9* 4.8  CL 96* 93*  CO2 21* 28  GLUCOSE 80 116*  BUN 99* 44*  CREATININE 13.51* 8.78*  CALCIUM 7.2* 8.2*   GFR: Estimated Creatinine Clearance: 14.6 mL/min (A) (by C-G formula based on SCr of 8.78 mg/dL (H)). Liver Function Tests: Recent Labs  Lab 07/04/20 1234  AST 16  ALT 16  ALKPHOS 41  BILITOT 0.5  PROT 7.7  ALBUMIN 3.8   Recent Labs  Lab 07/04/20 1234  LIPASE 52*   No results for input(s): AMMONIA in the last 168 hours. Coagulation Profile: No results for input(s): INR, PROTIME in the last 168 hours. Cardiac Enzymes: No results for input(s): CKTOTAL, CKMB, CKMBINDEX, TROPONINI in the last 168 hours. BNP (last 3 results) No results for input(s): PROBNP in the last 8760 hours. HbA1C: No results for input(s): HGBA1C in the last 72 hours. CBG: No results for input(s): GLUCAP in the last 168 hours. Lipid Profile: No results for input(s): CHOL, HDL, LDLCALC, TRIG, CHOLHDL, LDLDIRECT in the last 72 hours. Thyroid Function Tests: No results for input(s): TSH, T4TOTAL, FREET4, T3FREE, THYROIDAB in the last 72 hours. Anemia Panel: No results for input(s): VITAMINB12, FOLATE, FERRITIN, TIBC, IRON, RETICCTPCT in the last 72 hours. Sepsis Labs: No results for input(s): PROCALCITON, LATICACIDVEN in the last 168 hours.  Recent Results (from the past 240 hour(s))  Resp Panel by RT-PCR (Flu A&B, Covid) Nasopharyngeal Swab     Status: None   Collection  Time: 07/04/20  1:28 PM   Specimen: Nasopharyngeal Swab; Nasopharyngeal(NP) swabs in vial transport medium  Result Value Ref Range Status   SARS Coronavirus 2 by RT PCR NEGATIVE NEGATIVE Final    Comment: (NOTE) SARS-CoV-2 target nucleic acids are NOT DETECTED.  The SARS-CoV-2 RNA is generally detectable in upper respiratory specimens during the acute phase of infection. The lowest concentration of SARS-CoV-2 viral copies this assay can detect is 138 copies/mL. A negative result does not preclude SARS-Cov-2 infection and should not be used as the sole basis for treatment or other patient management decisions. A negative result may occur with  improper specimen collection/handling, submission of specimen other  than nasopharyngeal swab, presence of viral mutation(s) within the areas targeted by this assay, and inadequate number of viral copies(<138 copies/mL). A negative result must be combined with clinical observations, patient history, and epidemiological information. The expected result is Negative.  Fact Sheet for Patients:  EntrepreneurPulse.com.au  Fact Sheet for Healthcare Providers:  IncredibleEmployment.be  This test is no t yet approved or cleared by the [place] FDA and  has been authorized for detection and/or diagnosis of SARS-CoV-2 by FDA under an Emergency Use Authorization (EUA). This EUA will remain  in effect (meaning this test can be used) for the duration of the COVID-19 declaration under Section 564(b)(1) of the Act, 21 U.S.C.section 360bbb-3(b)(1), unless the authorization is terminated  or revoked sooner.       Influenza A by PCR NEGATIVE NEGATIVE Final   Influenza B by PCR NEGATIVE NEGATIVE Final    Comment: (NOTE) The Xpert Xpress SARS-CoV-2/FLU/RSV plus assay is intended as an aid in the diagnosis of influenza from Nasopharyngeal swab specimens and should not be used as a sole basis for treatment. Nasal washings  and aspirates are unacceptable for Xpert Xpress SARS-CoV-2/FLU/RSV testing.  Fact Sheet for Patients: EntrepreneurPulse.com.au  Fact Sheet for Healthcare Providers: IncredibleEmployment.be  This test is not yet approved or cleared by the [place] FDA and has been authorized for detection and/or diagnosis of SARS-CoV-2 by FDA under an Emergency Use Authorization (EUA). This EUA will remain in effect (meaning this test can be used) for the duration of the COVID-19 declaration under Section 564(b)(1) of the Act, 21 U.S.C. section 360bbb-3(b)(1), unless the authorization is terminated or revoked.  Performed at [facility], [place] [address], [place], [place] [address]       Radiology Studies: CT HEAD WO CONTRAST  Result Date: 07/05/2020 CLINICAL DATA:  Follow-up subdural hemorrhage EXAM: CT HEAD WITHOUT CONTRAST TECHNIQUE: Contiguous axial images were obtained from the base of the skull through the vertex without intravenous contrast. COMPARISON:  Yesterday FINDINGS: Brain: Primarily isointense subdural hematoma along the right cerebral convexity measuring 10 mm in maximum, unchanged from yesterday. Possible small hygroma along the left frontal convexity without clear cortical mass effect. Midline shift is 3 mm and non progressed. No entrapment or infarct seen. Vascular: Negative Skull: Negative Sinuses/Orbits: No visible injury IMPRESSION: Unchanged subdural hematoma on the right. Midline shift is unchanged at 3 mm. Electronically Signed   By: Monte Fantasia M.D.   On: 07/05/2020 06:46   CT Head Wo Contrast  Result Date: 07/04/2020 CLINICAL DATA:  Headache, intracranial hemorrhage suspected. EXAM: CT HEAD WITHOUT CONTRAST TECHNIQUE: Contiguous axial images were obtained from the base of the skull through the vertex without intravenous contrast. COMPARISON:  CT head February 02, 2020. FINDINGS: Brain: Approximately 11 mm mixed density right  cerebral convexity subdural hemorrhage. Areas of internal hyperdensity are compatible with acute/recent hemorrhage. Approximately 2-3 mm of leftward midline shift at the foramen of [place]. Basal cisterns are patent. No hydrocephalus. No evidence of acute large vascular territory infarct. Vascular: No hyperdense vessel. Skull: No acute fracture. Sinuses/Orbits: Remote right medial orbital wall fracture. Otherwise, unremarkable orbits. Mild polypoid mucosal thickening in the inferior maxillary sinuses. No air-fluid levels. Other: No mastoid effusions. IMPRESSION: Approximately 11 mm mixed density right cerebral convexity subdural hemorrhage. Areas of internal hyperdensity are compatible with acute/recent hemorrhage. Resulting 2-3 mm of leftward midline shift. Findings discussed with Dr. Billy Fischer via telephone at 3:05 PM. Electronically Signed   By: Margaretha Sheffield MD   On: 07/04/2020  15:09    Scheduled Meds: . calcium acetate  2,001 mg Oral TID WC  . Chlorhexidine Gluconate Cloth  6 each Topical Q0600  . [START ON 07/06/2020] doxercalciferol  6 mcg Intravenous Q M,W,F-HD  . sodium chloride flush  3 mL Intravenous Q12H   Continuous Infusions:   LOS: 0 days   Time spent: 32-minute   [name], MD [facility]  07/05/2020, 11:07 AM   To contact the attending provider between 7A-7P or the covering provider during after hours 7P-7A, please log into the web site [email or web address].

## 2020-07-05 NOTE — Progress Notes (Addendum)
Pt arrived to unit 2w18 at 2230. Received report from [facility]. Pt is A&O x 4, IV x 1, and L AVF. Pt oriented to room and unit. Call bell given to pt. Provider notified of pt's arrival.

## 2020-07-05 NOTE — Progress Notes (Signed)
Renal Navigator appreciates update from Nephrologist/Dr. Jonnie Finner that patient wants to dialyze at his outpatient clinic tomorrow, Wednesday, 07/06/20 and per Attending, will be ready for discharge. Navigator spoke with CSW/G. Rose Fillers who will arrange transportation at discharge to [facility] [place] to arrive as close to 10:30am as possible. Navigator appreciative of Dr. Doristine Bosworth who will discharge early morning. Per Nephrologist, patient's care is at [facility] and he can drive home from clinic after tx. Renal Navigator called clinic to notify of plan and Renal PA has already sent orders.   Alphonzo Cruise, [place] Renal Navigator [phone]

## 2020-07-06 ENCOUNTER — Other Ambulatory Visit: Payer: Self-pay | Admitting: Nurse Practitioner

## 2020-07-06 DIAGNOSIS — S065X9A Traumatic subdural hemorrhage with loss of consciousness of unspecified duration, initial encounter: Secondary | ICD-10-CM | POA: Diagnosis not present

## 2020-07-06 DIAGNOSIS — G4733 Obstructive sleep apnea (adult) (pediatric): Secondary | ICD-10-CM

## 2020-07-06 MED ORDER — HYDROCODONE-ACETAMINOPHEN 5-325 MG PO TABS: 1.0000 | ORAL_TABLET | Freq: Four times a day (QID) | ORAL | 0 refills | Status: DC | PRN

## 2020-07-06 NOTE — Progress Notes (Signed)
   [place], [place]  [address] Phone:  [phone]   Fax:  [phone]  Hospital requested reorder of CPAP setting unknown at this time. Patient has used in the past; missed/ non functioning.

## 2020-07-06 NOTE — Discharge Instructions (Signed)
Subdural Hematoma  A subdural hematoma is a collection of blood between the brain and its outer covering (dura). As the amount of blood increases, pressure builds on the brain. There are two types of subdural hematomas:  Acute. This type develops shortly after a hard, direct hit to the head and causes blood to collect very quickly. This is a medical emergency. If it is not diagnosed and treated quickly, it can lead to severe brain injury or death.  Chronic. This is when bleeding develops more slowly, over weeks or months. In some cases, this type does not cause symptoms. What are the causes? This condition is caused by bleeding (hemorrhage) from a broken (ruptured) blood vessel. In most cases, a blood vessel ruptures and bleeds because of a head injury, such as from a hard, direct hit. Head injuries can happen in car accidents, falls, assaults, or while playing sports. In rare cases, a hemorrhage can happen without a known cause (spontaneously), especially if you take blood thinners (anticoagulants). What increases the risk? This condition is more likely to develop in:  Older people.  Infants.  People who take blood thinners.  People who have head injuries.  People who abuse alcohol. What are the signs or symptoms? Symptoms of this condition can vary depending on the size of the hematoma. Symptoms can be mild, severe, or life-threatening. They include:  Headaches.  Nausea or vomiting.  Changes in vision, such as double vision or loss of vision.  Changes in speech or trouble understanding what people say.  Loss of balance or trouble walking.  Weakness, numbness, or tingling in the arms or legs, especially on one side of the body.  Seizures.  Change in personality.  Increased sleepiness.  Memory loss.  Loss of consciousness.  Coma. Symptoms of acute subdural hematoma can develop over minutes or hours. Symptoms of chronic subdural hematoma may develop over weeks or  months. How is this diagnosed? This condition is diagnosed based on the results of:  A physical exam.  Tests of strength, reflexes, coordination, senses, manner of walking (gait), and facial and eye movements (neurological exam).  Imaging tests, such as an MRI or a CT scan. How is this treated? Treatment for this condition depends on the type of hematoma and how severe it is. Treatment for acute hematoma may include:  Emergency surgery to drain blood or remove a blood clot.  Medicines that help the body get rid of excess fluids (diuretics). These may help to reduce pressure in the brain.  Assisted breathing (ventilation). Treatment for chronic hematoma may include:  Observation and bed rest at the hospital.  Surgery. If you take blood thinners, you may need to stop taking them for a short time. You may also be given anti-seizure (anticonvulsant) medicine. Sometimes, no treatment is needed for chronic subdural hematoma. Follow these instructions at home: Activity  Avoid situations where you could injure your head again, such as in competitive sports, downhill snow sports, and horseback riding. Do not do these activities until your health care provider approves. ? Wear protective gear, such as a helmet, when participating in activities such as biking or contact sports.  Avoid too much visual stimulation while recovering. This means limiting how much you read and limiting your screen time on a smart phone, tablet, computer, or TV.  Rest as told by your health care provider. Rest helps the brain heal.  Try to avoid activities that cause physical or mental stress. Return to work or school as told by  your health care provider.  Do not lift anything that is heavier than 5 lb (2.3 kg), or the limit you are told, until your health care provider says that it is safe.  Do not drive, ride a bike, or use heavy machinery until your health care provider approves.  Always wear your seat belt  when you are in a motor vehicle. Alcohol use  Do not drink alcohol if your health care provider tells you not to drink.  If you drink alcohol, limit how much you use to: ? 0-1 drink a day for women. ? 0-2 drinks a day for men. General instructions  Monitor your symptoms, and ask people around you to do the same. Recovery from brain injuries varies. Talk with your health care provider about what to expect.  Take over-the-counter and prescription medicines only as told by your health care provider. Do not take blood thinners or NSAIDs unless your health care provider approves. These include aspirin, ibuprofen, naproxen, and warfarin.  Keep your home environment safe to reduce the risk of falling.  Keep all follow-up visits as told by your health care provider. This is important. Where to find more information  National Institute of Neurological Disorders and Stroke: www.ninds.nih.gov  American Academy of Neurology (AAN): www.aan.com  Brain Injury Association of America: www.biausa.org Get help right away if you:  Are taking blood thinners and you fall or you experience minor trauma to the head. If you take any blood thinners, even a very small injury can cause a subdural hematoma.  Have a bleeding disorder and you fall or you experience minor trauma to the head.  Develop any of the following symptoms after a head injury: ? Clear fluid draining from your nose or ears. ? Nausea or vomiting. ? Changes in speech or trouble understanding what people say. ? Seizures. ? Drowsiness or a decrease in alertness. ? Double vision. ? Numbness or inability to move (paralysis) in any part of your body. ? Difficulty walking or poor coordination. ? Difficulty thinking. ? Confusion or forgetfulness. ? Personality changes. ? Irrational or aggressive behavior. These symptoms may represent a serious problem that is an emergency. Do not wait to see if the symptoms will go away. Get medical help  right away. Call your local emergency services (911 in the [place] Do not drive yourself to the hospital. Summary  A subdural hematoma is a collection of blood between the brain and its outer covering (dura).  Treatment for this condition depends on what type of subdural hematoma you have and how severe it is.  Symptoms can vary from mild to severe to life-threatening.  Monitor your symptoms, and ask others around you to do the same. This information is not intended to replace advice given to you by your health care provider. Make sure you discuss any questions you have with your health care provider. Document Revised: 02/03/2018 Document Reviewed: 02/03/2018 Elsevier Patient Education  2021 [employer].  

## 2020-07-06 NOTE — Discharge Summary (Signed)
Physician Discharge Summary  Michael [name] VOZ:[id number] DOB: 05/02/1975 DOA: 07/04/2020  PCP: [name], FNP  Admit date: 07/04/2020 Discharge date: 07/06/2020    Admitted From: Home Disposition: Home  Recommendations for Outpatient Follow-up:  1. Follow up with PCP in 1-2 weeks 2. Follow-up with Dr. Arnoldo Morale of neurosurgery in 10 to 14 days 3. Please continue to hold your aspirin until seen by neurosurgery and please discuss further with neurosurgery about taking aspirin. 4. Please obtain BMP/CBC in one week 5. Please follow up with your PCP on the following pending results: Unresulted Labs (From admission, onward)         None        Home Health: None Equipment/Devices: None  Discharge Condition: Stable CODE STATUS: Full code Diet recommendation: Cardiac  Subjective: Seen and examined.  Minimal headache.  No nausea.  He completed full breakfast.  Wanted to go home early this morning so he can get his dialysis at his routine outpatient clinic.  Requested few tablets of hydrocodone to go home with for headache.  Brief/Interim Summary: Michael Hunteris a 45 y.o.malewith medical history significant ofHTN; HLD; ESRD on MWF HD due to polycystic kidney diease; class 1 obesity; and OSA on CPAP presented with HA and abdominal pain for about 1 day duration.He is undergoing transplant evaluation.  His headache was getting worse and worse and so he decided to come in to ED. The headache is in both his temples. +photophobia earlier today. No visual disturbance.  Also vomited x1.  Upon arrival to ED, he was hemodynamically stable.  Found to have hyperkalemia of potassium 6.9.  CT head showed subdural hematoma.  He was admitted under hospitalist service.  Neurosurgery as well as nephrology consulted.  Patient received his hemodialysis.  Neurosurgery recommended follow-up repeat CT scan within 24 hours which was done on 07/05/2020 and showed stable subdural hematoma and stable midline  shift.  He was seen by neurosurgery again and they recommended follow-up as outpatient within 2 weeks for repeat CT head.  Since patient has still had headache and nausea so he was kept in the hospital.  This morning he feels better other than mild headache.  No nausea.  He wants to be discharged early this morning so he can get his dialysis as outpatient and he does not prefers to get dialysis here.  Patient is being discharged in stable condition.  He is prescribed 10 tablets of hydrocodone as needed.  He was informed that he is not supposed to take aspirin until cleared by neurosurgery.  He told me that he has not been taking aspirin for about a month now.    Discharge Diagnoses:  Principal Problem:   SDH (subdural hematoma) (HCC) Active Problems:   Adult polycystic kidney disease   Class 1 obesity due to excess calories with serious comorbidity and body mass index (BMI) of 34.0 to 34.9 in adult   Hyperkalemia   ESRD on hemodialysis [facility])    Discharge Instructions   Allergies as of 07/06/2020   No Known Allergies     Medication List    STOP taking these medications   aspirin 500 MG EC tablet     TAKE these medications   allopurinol 100 MG tablet Commonly known as: ZYLOPRIM Take 100 mg by mouth 2 (two) times daily as needed (Gout).   calcium acetate 667 MG capsule Commonly known as: PHOSLO Take 2,001 mg by mouth 3 (three) times daily with meals.   HYDROcodone-acetaminophen 5-325 MG tablet Commonly known  as: NORCO/VICODIN Take 1 tablet by mouth every 6 (six) hours as needed for moderate pain.   lidocaine-prilocaine cream Commonly known as: EMLA Apply 1 application topically See admin instructions. Apply topically to port access one hour prior to dialysis - Monday, Wednesday, Friday   multivitamin with minerals Tabs tablet Take 1 tablet by mouth every morning.   PRESCRIPTION MEDICATION Inhale into the lungs at bedtime. CPAP   VITAMIN C GUMMIE PO Take 1 tablet by mouth  every morning.       Follow-up Information    Newman Pies, MD Follow up in 10 day(s).   Specialty: Neurosurgery Contact information: [address] [place] [address] [place] [address] [phone]              No Known Allergies  Consultations: Neurosurgery and nephrology   Procedures/Studies: CT HEAD WO CONTRAST  Result Date: 07/05/2020 CLINICAL DATA:  Follow-up subdural hemorrhage EXAM: CT HEAD WITHOUT CONTRAST TECHNIQUE: Contiguous axial images were obtained from the base of the skull through the vertex without intravenous contrast. COMPARISON:  Yesterday FINDINGS: Brain: Primarily isointense subdural hematoma along the right cerebral convexity measuring 10 mm in maximum, unchanged from yesterday. Possible small hygroma along the left frontal convexity without clear cortical mass effect. Midline shift is 3 mm and non progressed. No entrapment or infarct seen. Vascular: Negative Skull: Negative Sinuses/Orbits: No visible injury IMPRESSION: Unchanged subdural hematoma on the right. Midline shift is unchanged at 3 mm. Electronically Signed   By: Monte Fantasia M.D.   On: 07/05/2020 06:46   CT Head Wo Contrast  Result Date: 07/04/2020 CLINICAL DATA:  Headache, intracranial hemorrhage suspected. EXAM: CT HEAD WITHOUT CONTRAST TECHNIQUE: Contiguous axial images were obtained from the base of the skull through the vertex without intravenous contrast. COMPARISON:  CT head February 02, 2020. FINDINGS: Brain: Approximately 11 mm mixed density right cerebral convexity subdural hemorrhage. Areas of internal hyperdensity are compatible with acute/recent hemorrhage. Approximately 2-3 mm of leftward midline shift at the foramen of [place]. Basal cisterns are patent. No hydrocephalus. No evidence of acute large vascular territory infarct. Vascular: No hyperdense vessel. Skull: No acute fracture. Sinuses/Orbits: Remote right medial orbital wall fracture. Otherwise, unremarkable orbits. Mild  polypoid mucosal thickening in the inferior maxillary sinuses. No air-fluid levels. Other: No mastoid effusions. IMPRESSION: Approximately 11 mm mixed density right cerebral convexity subdural hemorrhage. Areas of internal hyperdensity are compatible with acute/recent hemorrhage. Resulting 2-3 mm of leftward midline shift. Findings discussed with Dr. Billy Fischer via telephone at 3:05 PM. Electronically Signed   By: Margaretha Sheffield MD   On: 07/04/2020 15:09     Discharge Exam: Vitals:   07/06/20 0409 07/06/20 0700  BP: 137/88 (!) 152/74  Pulse: 74 85  Resp: 20 12  Temp: 97.7 F (36.5 C) 98.3 F (36.8 C)  SpO2: 97% 99%   Vitals:   07/05/20 2100 07/06/20 0059 07/06/20 0409 07/06/20 0700  BP:  (!) 143/87 137/88 (!) 152/74  Pulse: 84 83 74 85  Resp: 20 17 20 12   Temp:  98.1 F (36.7 C) 97.7 F (36.5 C) 98.3 F (36.8 C)  TempSrc:  Oral Axillary Oral  SpO2: 96% 97% 97% 99%  Weight:      Height:        General: Pt is alert, awake, not in acute distress, morbidly obese Cardiovascular: RRR, S1/S2 +, no rubs, no gallops Respiratory: CTA bilaterally, no wheezing, no rhonchi Abdominal: Soft, NT, ND, bowel sounds + Extremities: no edema, no cyanosis  The results of significant diagnostics from this hospitalization (including imaging, microbiology, ancillary and laboratory) are listed below for reference.     Microbiology: Recent Results (from the past 240 hour(s))  Resp Panel by RT-PCR (Flu A&B, Covid) Nasopharyngeal Swab     Status: None   Collection Time: 07/04/20  1:28 PM   Specimen: Nasopharyngeal Swab; Nasopharyngeal(NP) swabs in vial transport medium  Result Value Ref Range Status   SARS Coronavirus 2 by RT PCR NEGATIVE NEGATIVE Final    Comment: (NOTE) SARS-CoV-2 target nucleic acids are NOT DETECTED.  The SARS-CoV-2 RNA is generally detectable in upper respiratory specimens during the acute phase of infection. The lowest concentration of SARS-CoV-2 viral copies this  assay can detect is 138 copies/mL. A negative result does not preclude SARS-Cov-2 infection and should not be used as the sole basis for treatment or other patient management decisions. A negative result may occur with  improper specimen collection/handling, submission of specimen other than nasopharyngeal swab, presence of viral mutation(s) within the areas targeted by this assay, and inadequate number of viral copies(<138 copies/mL). A negative result must be combined with clinical observations, patient history, and epidemiological information. The expected result is Negative.  Fact Sheet for Patients:  EntrepreneurPulse.com.au  Fact Sheet for Healthcare Providers:  IncredibleEmployment.be  This test is no t yet approved or cleared by the [place] FDA and  has been authorized for detection and/or diagnosis of SARS-CoV-2 by FDA under an Emergency Use Authorization (EUA). This EUA will remain  in effect (meaning this test can be used) for the duration of the COVID-19 declaration under Section 564(b)(1) of the Act, 21 U.S.C.section 360bbb-3(b)(1), unless the authorization is terminated  or revoked sooner.       Influenza A by PCR NEGATIVE NEGATIVE Final   Influenza B by PCR NEGATIVE NEGATIVE Final    Comment: (NOTE) The Xpert Xpress SARS-CoV-2/FLU/RSV plus assay is intended as an aid in the diagnosis of influenza from Nasopharyngeal swab specimens and should not be used as a sole basis for treatment. Nasal washings and aspirates are unacceptable for Xpert Xpress SARS-CoV-2/FLU/RSV testing.  Fact Sheet for Patients: EntrepreneurPulse.com.au  Fact Sheet for Healthcare Providers: IncredibleEmployment.be  This test is not yet approved or cleared by the [place] FDA and has been authorized for detection and/or diagnosis of SARS-CoV-2 by FDA under an Emergency Use Authorization (EUA). This EUA will  remain in effect (meaning this test can be used) for the duration of the COVID-19 declaration under Section 564(b)(1) of the Act, 21 U.S.C. section 360bbb-3(b)(1), unless the authorization is terminated or revoked.  Performed at [facility], [place] [address], [place],  [address]      Labs: BNP (last 3 results) No results for input(s): BNP in the last 8760 hours. Basic Metabolic Panel: Recent Labs  Lab 07/04/20 1234 07/05/20 0309  NA 136 135  K 6.9* 4.8  CL 96* 93*  CO2 21* 28  GLUCOSE 80 116*  BUN 99* 44*  CREATININE 13.51* 8.78*  CALCIUM 7.2* 8.2*   Liver Function Tests: Recent Labs  Lab 07/04/20 1234  AST 16  ALT 16  ALKPHOS 41  BILITOT 0.5  PROT 7.7  ALBUMIN 3.8   Recent Labs  Lab 07/04/20 1234  LIPASE 52*   No results for input(s): AMMONIA in the last 168 hours. CBC: Recent Labs  Lab 07/04/20 1234 07/05/20 0309  WBC 9.9 10.3  NEUTROABS 6.5  --   HGB 11.5* 11.7*  HCT 36.9* 36.1*  MCV 90.7 86.6  PLT 231 252   Cardiac Enzymes: No results for input(s): CKTOTAL, CKMB, CKMBINDEX, TROPONINI in the last 168 hours. BNP: Invalid input(s): POCBNP CBG: No results for input(s): GLUCAP in the last 168 hours. D-Dimer No results for input(s): DDIMER in the last 72 hours. Hgb A1c No results for input(s): HGBA1C in the last 72 hours. Lipid Profile No results for input(s): CHOL, HDL, LDLCALC, TRIG, CHOLHDL, LDLDIRECT in the last 72 hours. Thyroid function studies No results for input(s): TSH, T4TOTAL, T3FREE, THYROIDAB in the last 72 hours.  Invalid input(s): FREET3 Anemia work up No results for input(s): VITAMINB12, FOLATE, FERRITIN, TIBC, IRON, RETICCTPCT in the last 72 hours. Urinalysis    Component Value Date/Time   COLORURINE STRAW (A) 07/04/2020 1238   APPEARANCEUR CLEAR 07/04/2020 1238   LABSPEC 1.011 07/04/2020 1238   PHURINE 8.0 07/04/2020 1238   GLUCOSEU NEGATIVE 07/04/2020 1238   HGBUR NEGATIVE 07/04/2020 1238   BILIRUBINUR  NEGATIVE 07/04/2020 1238   BILIRUBINUR small 02/16/2020 1051   KETONESUR NEGATIVE 07/04/2020 1238   PROTEINUR 100 (A) 07/04/2020 1238   UROBILINOGEN 0.2 02/16/2020 1051   UROBILINOGEN 0.2 06/05/2017 1434   NITRITE NEGATIVE 07/04/2020 1238   LEUKOCYTESUR NEGATIVE 07/04/2020 1238   Sepsis Labs Invalid input(s): PROCALCITONIN,  WBC,  LACTICIDVEN Microbiology Recent Results (from the past 240 hour(s))  Resp Panel by RT-PCR (Flu A&B, Covid) Nasopharyngeal Swab     Status: None   Collection Time: 07/04/20  1:28 PM   Specimen: Nasopharyngeal Swab; Nasopharyngeal(NP) swabs in vial transport medium  Result Value Ref Range Status   SARS Coronavirus 2 by RT PCR NEGATIVE NEGATIVE Final    Comment: (NOTE) SARS-CoV-2 target nucleic acids are NOT DETECTED.  The SARS-CoV-2 RNA is generally detectable in upper respiratory specimens during the acute phase of infection. The lowest concentration of SARS-CoV-2 viral copies this assay can detect is 138 copies/mL. A negative result does not preclude SARS-Cov-2 infection and should not be used as the sole basis for treatment or other patient management decisions. A negative result may occur with  improper specimen collection/handling, submission of specimen other than nasopharyngeal swab, presence of viral mutation(s) within the areas targeted by this assay, and inadequate number of viral copies(<138 copies/mL). A negative result must be combined with clinical observations, patient history, and epidemiological information. The expected result is Negative.  Fact Sheet for Patients:  EntrepreneurPulse.com.au  Fact Sheet for Healthcare Providers:  IncredibleEmployment.be  This test is no t yet approved or cleared by the [place] FDA and  has been authorized for detection and/or diagnosis of SARS-CoV-2 by FDA under an Emergency Use Authorization (EUA). This EUA will remain  in effect (meaning this test can be  used) for the duration of the COVID-19 declaration under Section 564(b)(1) of the Act, 21 U.S.C.section 360bbb-3(b)(1), unless the authorization is terminated  or revoked sooner.       Influenza A by PCR NEGATIVE NEGATIVE Final   Influenza B by PCR NEGATIVE NEGATIVE Final    Comment: (NOTE) The Xpert Xpress SARS-CoV-2/FLU/RSV plus assay is intended as an aid in the diagnosis of influenza from Nasopharyngeal swab specimens and should not be used as a sole basis for treatment. Nasal washings and aspirates are unacceptable for Xpert Xpress SARS-CoV-2/FLU/RSV testing.  Fact Sheet for Patients: EntrepreneurPulse.com.au  Fact Sheet for Healthcare Providers: IncredibleEmployment.be  This test is not yet approved or cleared by the [place] FDA and has been authorized for detection and/or diagnosis of SARS-CoV-2 by FDA under  an Emergency Use Authorization (EUA). This EUA will remain in effect (meaning this test can be used) for the duration of the COVID-19 declaration under Section 564(b)(1) of the Act, 21 U.S.C. section 360bbb-3(b)(1), unless the authorization is terminated or revoked.  Performed at [facility], [place] [address] [place], [place] [address]      Time coordinating discharge: Over 30 minutes  SIGNED:   [name], MD  [facility] 07/06/2020, 9:03 AM  If 7PM-7AM, please contact night-coverage [email or web address]

## 2020-07-06 NOTE — TOC Initial Note (Signed)
Transition of Care [facility]) - Initial/Assessment Note    Patient Details  Name: Michael [name] MRN: [id number] Date of Birth: [birth year]  Transition of Care [facility]) CM/SW Contact:    Joanne Chars, LCSW Phone Number: 07/06/2020, 10:53 AM  Clinical Narrative:    CSW contaced by renal navigator yesterday regarding pt need to DC early so he can get to HD by 1030.  [facility] [employer] contacted, uber ride arranged, waiver signed and placed on chart.   CSW spoke with Freda Munro at [employer]: complicated situation as pt has significant past due balance.  If pt returns his CPAP, and is willing to enter payment plan on balance they could start over using new insurance.  They would also need new CPAP order.  Pt is active at patient care center/[facility].  CSW contacted Tasha/CMA there.  Cammie Sickle is current provider, out on PAL this week.  Crystal Edison Pace is covering and she will speak with her about a new order.  CSW later received call back and Crystal did right new order and faxed this to [place].  CSW and Freda Munro spoke with pt on speakerphone in room, pt agreeable to making payments on his past due balance in order for them to replaced CPAP machine, confirmed [employer] has correct phone number.  Pt confirms that CPAP is working presently.   CSW spoke with Bethanne Ginger FF in office, provided the CPAP order.  Per Zack, machine may not be replaced, but if pt pays on balance, they can check out his machine, provide new supplies and monitor moving forward.   He will follow up.              Expected Discharge Plan: Home/Self Care Barriers to Discharge: No Barriers Identified   Patient Goals and CMS Choice   CMS Medicare.gov Compare Post Acute Care list provided to::  (na)    Expected Discharge Plan and Services Expected Discharge Plan: Home/Self Care     Post Acute Care Choice: Durable Medical Equipment (CPAP) Living arrangements for the past 2 months: Apartment Expected Discharge Date: 07/06/20                DME Arranged:  (CPAP) DME Agency: [employer] Date DME Agency Contacted: 07/06/20 Time DME Agency Contacted: 0830 Representative spoke with at DME Agency: Sheila/Zack(later) HH Arranged: NA          Prior Living Arrangements/Services Living arrangements for the past 2 months: Apartment Lives with:: Self              Current home services: DME    Activities of Daily Living Home Assistive Devices/Equipment: Contact lenses,CPAP ADL Screening (condition at time of admission) Patient's cognitive ability adequate to safely complete daily activities?: Yes Is the patient deaf or have difficulty hearing?: No Does the patient have difficulty seeing, even when wearing glasses/contacts?: No Does the patient have difficulty concentrating, remembering, or making decisions?: No Patient able to express need for assistance with ADLs?: Yes Does the patient have difficulty dressing or bathing?: No Independently performs ADLs?: Yes (appropriate for developmental age) Does the patient have difficulty walking or climbing stairs?: No Weakness of Legs: None Weakness of Arms/Hands: None  Permission Sought/Granted                  Emotional Assessment Appearance:: Appears stated age Attitude/Demeanor/Rapport: Engaged Affect (typically observed): Appropriate Orientation: : Oriented to Self,Oriented to Place,Oriented to  Time,Oriented to Situation Alcohol / Substance Use: Not Applicable Psych Involvement: No (comment)  Admission diagnosis:  SDH (subdural hematoma) ([facility]) [S06.5X9A] Patient Active Problem List   Diagnosis Date Noted  . SDH (subdural hematoma) ([facility]) 07/04/2020  . AKI (acute kidney injury) ([facility]) 12/07/2019  . Uremia 12/02/2019  . Chest pain 12/02/2019  . Elevated troponin 12/02/2019  . Anemia in CKD (chronic kidney disease) 12/02/2019  . Adjustment disorder with mixed anxiety and depressed mood 11/28/2019  . Renal failure 11/25/2019  . Hyperkalemia 11/25/2019  .  Normocytic anemia 11/25/2019  . Metabolic acidosis, increased anion gap   . Hematochezia   . Chronic gout due to renal impairment without tophus 12/14/2018  . Polycystic kidney disease 11/09/2016  . ESRD on hemodialysis ([facility]) 11/09/2016  . Observed sleep apnea 09/01/2016  . Excessive daytime sleepiness 09/01/2016  . Primary snoring 09/01/2016  . Acute pyelonephritis 05/25/2016  . Acute gouty arthritis 05/25/2016  . Hematuria 05/25/2016  . Essential hypertension, benign 05/25/2016  . Class 1 obesity due to excess calories with serious comorbidity and body mass index (BMI) of 34.0 to 34.9 in adult 05/25/2016  . Hypertensive crisis 05/23/2016  . Adult polycystic kidney disease 05/23/2016  . SIRS (systemic inflammatory response syndrome) ([place]) 05/23/2016   PCP:  [name], FNP Pharmacy:   [facility] [place], [place] AT [facility] OF [place] [address] Phone: [phone] Fax: [phone]  [facility] [place], [place] - [address] [place], [address] [place] [address] Phone: [phone] Fax: [phone]     Social Determinants of Health (SDOH) Interventions    Readmission Risk Interventions No flowsheet data found.

## 2020-07-15 ENCOUNTER — Telehealth: Payer: Self-pay

## 2020-07-15 ENCOUNTER — Other Ambulatory Visit: Payer: Self-pay | Admitting: Family Medicine

## 2020-07-15 NOTE — Telephone Encounter (Signed)
Oxycodone for [employer] on [address]/ [place]

## 2020-07-18 ENCOUNTER — Other Ambulatory Visit: Payer: Self-pay | Admitting: Family Medicine

## 2020-07-18 ENCOUNTER — Telehealth: Payer: Self-pay | Admitting: Family Medicine

## 2020-07-18 ENCOUNTER — Telehealth: Payer: Self-pay

## 2020-07-18 ENCOUNTER — Emergency Department (HOSPITAL_COMMUNITY): Payer: 59

## 2020-07-18 ENCOUNTER — Observation Stay (HOSPITAL_COMMUNITY)
Admission: EM | Admit: 2020-07-18 | Discharge: 2020-07-19 | Disposition: A | Discharge status: Home / Self Care | Payer: 59 | Attending: Internal Medicine | Admitting: Internal Medicine

## 2020-07-18 DIAGNOSIS — I6201 Nontraumatic acute subdural hemorrhage: Secondary | ICD-10-CM | POA: Insufficient documentation

## 2020-07-18 DIAGNOSIS — Z87891 Personal history of nicotine dependence: Secondary | ICD-10-CM | POA: Diagnosis not present

## 2020-07-18 DIAGNOSIS — I120 Hypertensive chronic kidney disease with stage 5 chronic kidney disease or end stage renal disease: Secondary | ICD-10-CM | POA: Insufficient documentation

## 2020-07-18 DIAGNOSIS — R2689 Other abnormalities of gait and mobility: Secondary | ICD-10-CM | POA: Diagnosis not present

## 2020-07-18 DIAGNOSIS — I6200 Nontraumatic subdural hemorrhage, unspecified: Secondary | ICD-10-CM

## 2020-07-18 DIAGNOSIS — Z992 Dependence on renal dialysis: Secondary | ICD-10-CM | POA: Diagnosis not present

## 2020-07-18 DIAGNOSIS — N186 End stage renal disease: Secondary | ICD-10-CM | POA: Diagnosis not present

## 2020-07-18 DIAGNOSIS — Z20822 Contact with and (suspected) exposure to covid-19: Secondary | ICD-10-CM | POA: Diagnosis not present

## 2020-07-18 DIAGNOSIS — S065X9A Traumatic subdural hemorrhage with loss of consciousness of unspecified duration, initial encounter: Secondary | ICD-10-CM | POA: Diagnosis present

## 2020-07-18 DIAGNOSIS — S065XAA Traumatic subdural hemorrhage with loss of consciousness status unknown, initial encounter: Secondary | ICD-10-CM | POA: Diagnosis present

## 2020-07-18 DIAGNOSIS — Z79899 Other long term (current) drug therapy: Secondary | ICD-10-CM | POA: Insufficient documentation

## 2020-07-18 DIAGNOSIS — R519 Headache, unspecified: Secondary | ICD-10-CM | POA: Diagnosis present

## 2020-07-18 LAB — BASIC METABOLIC PANEL
Anion gap: 13 (ref 5–15)
BUN: 52 mg/dL — ABNORMAL HIGH (ref 6–20)
CO2: 25 mmol/L (ref 22–32)
Calcium: 8 mg/dL — ABNORMAL LOW (ref 8.9–10.3)
Chloride: 98 mmol/L (ref 98–111)
Creatinine, Ser: 8.94 mg/dL — ABNORMAL HIGH (ref 0.61–1.24)
GFR, Estimated: 7 mL/min — ABNORMAL LOW (ref >60)
Glucose, Bld: 85 mg/dL (ref 70–99)
Potassium: 4.8 mmol/L (ref 3.5–5.1)
Sodium: 136 mmol/L (ref 135–145)

## 2020-07-18 LAB — RESP PANEL BY RT-PCR (FLU A&B, COVID) ARPGX2
Influenza A by PCR: NEGATIVE
Influenza B by PCR: NEGATIVE
SARS Coronavirus 2 by RT PCR: NEGATIVE

## 2020-07-18 LAB — CBC WITH DIFFERENTIAL/PLATELET
Abs Immature Granulocytes: 0.05 10*3/uL (ref 0.00–0.07)
Basophils Absolute: 0.1 10*3/uL (ref 0.0–0.1)
Basophils Relative: 1 %
Eosinophils Absolute: 0.2 10*3/uL (ref 0.0–0.5)
Eosinophils Relative: 2 %
HCT: 38.3 % — ABNORMAL LOW (ref 39.0–52.0)
Hemoglobin: 12 g/dL — ABNORMAL LOW (ref 13.0–17.0)
Immature Granulocytes: 1 %
Lymphocytes Relative: 19 %
Lymphs Abs: 1.8 10*3/uL (ref 0.7–4.0)
MCH: 27.5 pg (ref 26.0–34.0)
MCHC: 31.3 g/dL (ref 30.0–36.0)
MCV: 87.8 fL (ref 80.0–100.0)
Monocytes Absolute: 0.5 10*3/uL (ref 0.1–1.0)
Monocytes Relative: 6 %
Neutro Abs: 6.8 10*3/uL (ref 1.7–7.7)
Neutrophils Relative %: 71 %
Platelets: 312 10*3/uL (ref 150–400)
RBC: 4.36 MIL/uL (ref 4.22–5.81)
RDW: 13.8 % (ref 11.5–15.5)
WBC: 9.3 10*3/uL (ref 4.0–10.5)
nRBC: 0 % (ref 0.0–0.2)

## 2020-07-18 LAB — PROTIME-INR
INR: 1.1 (ref 0.8–1.2)
Prothrombin Time: 14 seconds (ref 11.4–15.2)

## 2020-07-18 MED ORDER — ADULT MULTIVITAMIN W/MINERALS CH
1.0000 | ORAL_TABLET | Freq: Every morning | ORAL | Status: DC
  Administered 2020-07-19: 1 via ORAL
  Filled 2020-07-18: qty 1

## 2020-07-18 MED ORDER — LIDOCAINE-PRILOCAINE 2.5-2.5 % EX CREA: 1.0000 "application " | TOPICAL_CREAM | CUTANEOUS | Status: DC

## 2020-07-18 MED ORDER — ACETAMINOPHEN 325 MG PO TABS: 650.0000 mg | ORAL_TABLET | ORAL | Status: DC | PRN

## 2020-07-18 MED ORDER — ASCORBIC ACID 500 MG PO TABS
500.0000 mg | ORAL_TABLET | Freq: Every morning | ORAL | Status: DC
  Administered 2020-07-19: 500 mg via ORAL
  Filled 2020-07-18: qty 1

## 2020-07-18 MED ORDER — ACETAMINOPHEN 160 MG/5ML PO SOLN: 650.0000 mg | ORAL | Status: DC | PRN

## 2020-07-18 MED ORDER — DIPHENHYDRAMINE HCL 50 MG/ML IJ SOLN
25.0000 mg | Freq: Once | INTRAMUSCULAR | Status: AC
  Administered 2020-07-18: 25 mg via INTRAVENOUS
  Filled 2020-07-18: qty 1

## 2020-07-18 MED ORDER — HYDRALAZINE HCL 20 MG/ML IJ SOLN: 5.0000 mg | Freq: Four times a day (QID) | INTRAMUSCULAR | Status: DC | PRN

## 2020-07-18 MED ORDER — SENNOSIDES-DOCUSATE SODIUM 8.6-50 MG PO TABS
1.0000 | ORAL_TABLET | Freq: Every evening | ORAL | Status: DC | PRN
  Administered 2020-07-18: 1 via ORAL
  Filled 2020-07-18: qty 1

## 2020-07-18 MED ORDER — HYDROMORPHONE HCL 1 MG/ML IJ SOLN: 0.5000 mg | INTRAMUSCULAR | Status: DC | PRN

## 2020-07-18 MED ORDER — ALLOPURINOL 100 MG PO TABS
100.0000 mg | ORAL_TABLET | Freq: Two times a day (BID) | ORAL | Status: DC | PRN
  Filled 2020-07-18: qty 1

## 2020-07-18 MED ORDER — FENTANYL CITRATE (PF) 100 MCG/2ML IJ SOLN
50.0000 ug | Freq: Once | INTRAMUSCULAR | Status: AC
  Administered 2020-07-18: 50 ug via INTRAVENOUS
  Filled 2020-07-18: qty 2

## 2020-07-18 MED ORDER — METOCLOPRAMIDE HCL 5 MG/ML IJ SOLN
10.0000 mg | Freq: Once | INTRAMUSCULAR | Status: AC
  Administered 2020-07-18: 10 mg via INTRAVENOUS
  Filled 2020-07-18: qty 2

## 2020-07-18 MED ORDER — ONDANSETRON 4 MG PO TBDP
4.0000 mg | ORAL_TABLET | Freq: Once | ORAL | Status: AC
  Administered 2020-07-18: 4 mg via ORAL
  Filled 2020-07-18: qty 1

## 2020-07-18 MED ORDER — ONDANSETRON HCL 4 MG/2ML IJ SOLN: 4.0000 mg | Freq: Four times a day (QID) | INTRAMUSCULAR | Status: DC | PRN

## 2020-07-18 MED ORDER — HYDROCODONE-ACETAMINOPHEN 5-325 MG PO TABS
1.0000 | ORAL_TABLET | Freq: Four times a day (QID) | ORAL | Status: DC | PRN
  Administered 2020-07-18 – 2020-07-19 (×2): 1 via ORAL
  Filled 2020-07-18 (×2): qty 1

## 2020-07-18 MED ORDER — LEVETIRACETAM IN NACL 500 MG/100ML IV SOLN
500.0000 mg | Freq: Every day | INTRAVENOUS | Status: DC
  Administered 2020-07-18 – 2020-07-19 (×2): 500 mg via INTRAVENOUS
  Filled 2020-07-18 (×3): qty 100

## 2020-07-18 MED ORDER — STROKE: EARLY STAGES OF RECOVERY BOOK
Freq: Once | Status: AC
  Filled 2020-07-18: qty 1

## 2020-07-18 MED ORDER — CALCIUM ACETATE (PHOS BINDER) 667 MG PO CAPS
2001.0000 mg | ORAL_CAPSULE | Freq: Three times a day (TID) | ORAL | Status: DC
  Administered 2020-07-19 (×3): 2001 mg via ORAL
  Filled 2020-07-18 (×3): qty 3

## 2020-07-18 MED ORDER — ACETAMINOPHEN 650 MG RE SUPP: 650.0000 mg | RECTAL | Status: DC | PRN

## 2020-07-18 NOTE — H&P (Signed)
History and Physical    [name] HYQ:[id number] DOB: 1975/10/15 DOA: 07/18/2020  PCP: [name], FNP (Confirm with patient/family/NH records and if not entered, this has to be entered at [facility] point of entry) Patient coming from: Home  I have personally briefly reviewed patient's old medical records in [place]  Chief Complaint: headache  HPI: Michael [name] is a 45 y.o. male with medical history significant of recently subdural hematoma, ESRD on HD (polycystic kidney disease), HTN, HLD, presented with worsening of headache.  Patient brought developed severe right-sided headache came to hospital 2 weeks ago was found to have spontaneous acute subdural hematoma.  After observation for 2 days and repeat imaging shows stabilized bleeding status, patient discharged home with pain medications.  However, patient reported that right-sided headache persisted, and he currently got partial leave with oxycodone which he ran out last week.  Since then he has experienced persistent right-sided headache, 8-9/10, dull ache, meantime he has been having blurring of frequent nauseous and vomited 1-2 times a day since last week, usually smaller content, no palpable blood.  Denied any double vision, blurry vision, weakness or numbness of any double limbs.  Denies trauma or fall. No LOC. No significant extra sleep cycle disturbance or fatigue.   ED Course: CT head found progressive SDH compared to 2 weeks ago, maximum clot thickness increased 10 mm>28mm today, midline shift increased to 8 mm.  Review of Systems: As per HPI otherwise 14 point review of systems negative.    Past Medical History:  Diagnosis Date  . Gout   . History of renal dialysis    M-W-F  . HLD (hyperlipidemia)   . Hypertension   . Pneumonia   . Polycystic kidney disease   . Sleep apnea 07/29/2018   uses cpap nightly  . Vitamin D deficiency 11/2018    Past Surgical History:  Procedure Laterality Date  . AV FISTULA  PLACEMENT Left 03/24/2020   Procedure: INSERTION OF LEFT UPPER EXTREMITY ARTERIOVENOUS (AV) GORE-TEX GRAFT;  Surgeon: [name], MD;  Location: [facility];  Service: Vascular;  Laterality: Left;  PERIPHERAL NERVE BLOCK  . BASCILIC VEIN TRANSPOSITION Left 12/11/2019   Procedure: 1ST STAGE BASILIC TRANSPOSITION OF LEFT ARM;  Surgeon: Angelia Mould, MD;  Location: [facility];  Service: Vascular;  Laterality: Left;  . IR FLUORO GUIDE CV LINE RIGHT  12/09/2019  . IR US GUIDE VASC ACCESS RIGHT  12/09/2019  . LEFT HEART CATH AND CORONARY ANGIOGRAPHY N/A 12/09/2019   Procedure: LEFT HEART CATH AND CORONARY ANGIOGRAPHY;  Surgeon: Adrian Prows, MD;  Location: [facility];  Service: Cardiovascular;  Laterality: N/A;     reports that he quit smoking about 7 years ago. His smoking use included cigarettes. He has a 2.00 pack-year smoking history. He has never used smokeless tobacco. He reports previous alcohol use. He reports that he does not use drugs.  No Known Allergies  Family History  Problem Relation Age of Onset  . Bipolar disorder Father   . Polycystic kidney disease Neg Hx      Prior to Admission medications   Medication Sig Start Date End Date Taking? Authorizing Provider  allopurinol (ZYLOPRIM) 100 MG tablet Take 100 mg by mouth 2 (two) times daily as needed (Gout).    [provider]  Ascorbic Acid (VITAMIN C GUMMIE PO) Take 1 tablet by mouth every morning.    [provider]  calcium acetate (PHOSLO) 667 MG capsule Take 2,001 mg by mouth  3 (three) times daily with meals.    [provider]  HYDROcodone-acetaminophen (NORCO/VICODIN) 5-325 MG tablet Take 1 tablet by mouth every 6 (six) hours as needed for moderate pain. 07/06/20   [name], MD  lidocaine-prilocaine (EMLA) cream Apply 1 application topically See admin instructions. Apply topically to port access one hour prior to dialysis - Monday, Wednesday, Friday    [provider]  Multiple  Vitamin (MULTIVITAMIN WITH MINERALS) TABS tablet Take 1 tablet by mouth every morning.    [provider]  PRESCRIPTION MEDICATION Inhale into the lungs at bedtime. CPAP    [provider]  isosorbide dinitrate (ISORDIL) 10 MG tablet Take 1 tablet (10 mg total) by mouth 2 (two) times daily. Patient not taking: No sig reported 02/04/20 03/24/20  [name], FNP    Physical Exam: Vitals:   07/18/20 1510 07/18/20 1823 07/18/20 1911  BP: 119/82 123/72   Pulse: 82 76   Resp: 14 16   Temp: 98.6 F (37 C) 98.5 F (36.9 C)   TempSrc: Oral Oral   SpO2: 99% 97%   Weight:   120 kg  Height:   6\' 1"  (1.854 m)    Constitutional: NAD, calm, comfortable Vitals:   07/18/20 1510 07/18/20 1823 07/18/20 1911  BP: 119/82 123/72   Pulse: 82 76   Resp: 14 16   Temp: 98.6 F (37 C) 98.5 F (36.9 C)   TempSrc: Oral Oral   SpO2: 99% 97%   Weight:   120 kg  Height:   6\' 1"  (1.854 m)   Eyes: PERRL, lids and conjunctivae normal ENMT: Mucous membranes are moist. Posterior pharynx clear of any exudate or lesions.Normal dentition.  Neck: normal, supple, no masses, no thyromegaly Respiratory: clear to auscultation bilaterally, no wheezing, no crackles. Normal respiratory effort. No accessory muscle use.  Cardiovascular: Regular rate and rhythm, no murmurs / rubs / gallops. No extremity edema. 2+ pedal pulses. No carotid bruits.  Abdomen: no tenderness, no masses palpated. No hepatosplenomegaly. Bowel sounds positive.  Musculoskeletal: no clubbing / cyanosis. No joint deformity upper and lower extremities. Good ROM, no contractures. Normal muscle tone.  Skin: no rashes, lesions, ulcers. No induration Neurologic: CN 2-12 grossly intact. Sensation intact, DTR normal. Strength 5/5 in all 4.  Psychiatric: Normal judgment and insight. Alert and oriented x 3. Normal mood.     Labs on Admission: I have personally reviewed following labs and imaging studies  CBC: Recent Labs  Lab  07/18/20 1449  WBC 9.3  NEUTROABS 6.8  HGB 12.0*  HCT 38.3*  MCV 87.8  PLT 244   Basic Metabolic Panel: Recent Labs  Lab 07/18/20 1449  NA 136  K 4.8  CL 98  CO2 25  GLUCOSE 85  BUN 52*  CREATININE 8.94*  CALCIUM 8.0*   GFR: Estimated Creatinine Clearance: 14.3 mL/min (A) (by C-G formula based on SCr of 8.94 mg/dL (H)). Liver Function Tests: No results for input(s): AST, ALT, ALKPHOS, BILITOT, PROT, ALBUMIN in the last 168 hours. No results for input(s): LIPASE, AMYLASE in the last 168 hours. No results for input(s): AMMONIA in the last 168 hours. Coagulation Profile: No results for input(s): INR, PROTIME in the last 168 hours. Cardiac Enzymes: No results for input(s): CKTOTAL, CKMB, CKMBINDEX, TROPONINI in the last 168 hours. BNP (last 3 results) No results for input(s): PROBNP in the last 8760 hours. HbA1C: No results for input(s): HGBA1C in the last 72 hours. CBG: No results for input(s): GLUCAP in the  last 168 hours. Lipid Profile: No results for input(s): CHOL, HDL, LDLCALC, TRIG, CHOLHDL, LDLDIRECT in the last 72 hours. Thyroid Function Tests: No results for input(s): TSH, T4TOTAL, FREET4, T3FREE, THYROIDAB in the last 72 hours. Anemia Panel: No results for input(s): VITAMINB12, FOLATE, FERRITIN, TIBC, IRON, RETICCTPCT in the last 72 hours. Urine analysis:    Component Value Date/Time   COLORURINE STRAW (A) 07/04/2020 1238   APPEARANCEUR CLEAR 07/04/2020 1238   LABSPEC 1.011 07/04/2020 1238   PHURINE 8.0 07/04/2020 1238   GLUCOSEU NEGATIVE 07/04/2020 1238   HGBUR NEGATIVE 07/04/2020 1238   BILIRUBINUR NEGATIVE 07/04/2020 1238   BILIRUBINUR small 02/16/2020 1051   KETONESUR NEGATIVE 07/04/2020 1238   PROTEINUR 100 (A) 07/04/2020 1238   UROBILINOGEN 0.2 02/16/2020 1051   UROBILINOGEN 0.2 06/05/2017 1434   NITRITE NEGATIVE 07/04/2020 1238   LEUKOCYTESUR NEGATIVE 07/04/2020 1238    Radiological Exams on Admission: CT Head Wo Contrast  Result Date:  07/18/2020 CLINICAL DATA:  Nausea and vomiting, headache, migraine EXAM: CT HEAD WITHOUT CONTRAST TECHNIQUE: Contiguous axial images were obtained from the base of the skull through the vertex without intravenous contrast. COMPARISON:  07/05/2020 FINDINGS: Brain: Since the previous exam, there has been interval development of new acute component to the subdural hematoma seen previously. Along the right frontal region the subdural hematoma now measures 10 mm in thickness. Along the right parietal region the subdural hematoma measures up to 15 mm in thickness. There is diffuse sulcal effacement throughout the right cerebral hemisphere, with leftward midline shift measuring approximately 8 mm at the level of the septum pellucidum. No evidence of acute infarct. Mild effacement of the lateral ventricles due to the mass effect described above. Vascular: No hyperdense vessel or unexpected calcification. Skull: Normal. Negative for fracture or focal lesion. Sinuses/Orbits: Minimal polypoid mucosal thickening of the maxillary sinuses, stable. Other: None. IMPRESSION: 1. Acute on chronic right-sided subdural hematoma as above, with mass effect and leftward midline shift measuring 8 mm. 2. No acute infarct. Critical Value/emergent results were called by telephone at the time of interpretation on 07/18/2020 at 6:34 pm to provider DR Darl Householder , who verbally acknowledged these results. Electronically Signed   By: Randa Ngo M.D.   On: 07/18/2020 18:37    EKG: Independently reviewed. Ordered.  Assessment/Plan Active Problems:   SDH (subdural hematoma) (HCC)  (please populate well all problems here in Problem List. (For example, if patient is on BP meds at home and you resume or decide to hold them, it is a problem that needs to be her. Same for CAD, COPD, HLD and so on)  Acute on subacute subdural hematoma -GCS=15, neuro exam nonfocal. -Control blood pressure, aiming at 130/80, start as needed hydralazine -Neurosurgery  consulted, not recommending further agent such as Mannitol for now. -As needed oxycodone -Pupils are equal and N/V has not been worsening, all arguing against a significant increase of intracranial HTN. -Frequent neuro checks -Seizure precaution, aspiration precaution  ESRD on HD -Nephro consulted and recommend resume HD tomorrow.  HTN -Aiming at 130/80.  Morbid obesity -Calorie control.  DVT prophylaxis: SCD Code Status: Full Code Family Communication: None at bedside Disposition Plan: Expect 1-2 days hospital stay. Consults called: Neuosurgery and Nephro Admission status: PCU   Lequita Halt MD [facility] Pager [phone]  07/18/2020, 7:18 PM

## 2020-07-18 NOTE — ED Notes (Signed)
Rounded on patient in the waiting room. Pt upset and cursing that he took an ambulance here and isn't in a room. Pt has been seen by multiple providers and remains stable to wait. Pt alert, oriented x4, sitting in recliner. No distress. Apologized for wait times but currently no bed available. Will continue to monitor.

## 2020-07-18 NOTE — ED Notes (Signed)
Admitting provider at bedside.

## 2020-07-18 NOTE — Consult Note (Addendum)
   Providing Compassionate, Quality Care - Together  Neurosurgery Consult  Referring physician: Dr. Darl Householder Reason for referral: Subdural hematoma  Chief Complaint: Right-sided headaches  History of Present Illness: This is a pleasant 45 year old male with a history of polycystic kidney disease, on dialysis Monday/Wednesday/Friday that presents with progressive worsening headaches.  He was seen approximately 2 weeks ago due to headaches after a fall a few months ago and hitting his head.  He was found to have a small chronic subdural with minimal midline shift at this time.  He had an appointment to see one of my partners, Dr. Arnoldo Morale on Thursday however his headaches became intractable and therefore he returned to the emergency department.  He denies any seizure activity.  He did undergo dialysis for 2 hours today.  He denies taking any anticoagulants or blood thinners.  He denies any recent trauma.   Medications: I have reviewed the patient's current medications. Allergies: No Known Allergies  History reviewed. No pertinent family history. Social History:  has no history on file for tobacco use, alcohol use, and drug use.  ROS: All pertinent positive negatives are listed in HPI above  Physical Exam:  Vital signs in last 24 hours: Temp:  [98 F (36.7 C)-98.3 F (36.8 C)] 98 F (36.7 C) (07/25 1814) Pulse Rate:  [58-128] 65 (07/26 0746) Resp:  [11-18] 14 (07/26 0217) BP: (138-182)/(65-125) 153/88 (07/26 0700) SpO2:  [91 %-98 %] 96 % (07/26 0746) PE: Awake alert oriented x3 No acute distress  PERRLA EOMI Cranial nerves II through XII intact Face symmetric No drift Full strength bilateral upper/lower extremities Sensory intact light touch Normal coordination GCS 15  Imaging: CT brain reviewed and compared to prior in April.  There is now acute on chronic subdural hematoma of varying thickness in the right frontal and parietal region with mass-effect approximately 7 mm from right  to left.  Basal cisterns remain patent.  This is new compared to the prior in April, previously it was all mostly chronic subdural hematoma.  Impression/Assessment:  45 year old male with  1.  Acute on chronic right subdural hematoma with midline shift 2.  Polycystic kidney disease on hemodialysis  Plan:  -Admission to hospitalist for neurologic monitoring -q2h neurochecks -Keppra, renal dosing -We will repeat CT in approximately 8 hours -Maintain n.p.o. status -hold all anticoag   Thank you for allowing me to participate in this patient's care.  Please do not hesitate to call with questions or concerns.   Elwin Sleight, [facility] Cell: [phone]

## 2020-07-18 NOTE — ED Notes (Signed)
md at bedside

## 2020-07-18 NOTE — ED Notes (Signed)
Attempted to give report, asked to call back in 37min

## 2020-07-18 NOTE — ED Triage Notes (Incomplete)
Pt bib ems from dialysis, received 1/2 treatment today. Pt started to complain of headache with nausea and vomiting. Pt dx with subdural in march and has had issues with headaches since then. Denies any recent falls.  158/82 HR 70 RR 16 100% RA CBG 107

## 2020-07-18 NOTE — Telephone Encounter (Signed)
Michael [name] is a 45 year old male with a medical history significant for end-stage renal disease, history of polycystic kidney disease, chronic gout, and adjustment disorder with mixed anxiety and depressed mood is requesting hydrocodone for headache.  Patient was treated and evaluated for a subdural hematoma and was discharged on 07/06/2020.  Patient is complaining of a worsening headache that is constant.  He has been having this headache daily and states that the problem is exacerbated since he ran out of hydrocodone.  Patient is complaining of some photophobia without phonophobia.  He has no visual disturbances at this time and reports periodic vomiting.  Neurosurgery was consulted during his admissions.  Patient is scheduled to follow-up with neurosurgery and repeat CT scan this week.  Patient is requesting headache for migraines, there is no official migraine diagnosis.  However, due to the fact that he has a subdural hematoma, patient will need to be evaluated in the emergency department.  Patient is currently at dialysis, advised to notify providers.  EMS may be warranted.  The patient was given clear instructions to go to ER if symptoms do not improve, worsen or new problems develop. The patient verbalized understanding.    [name]  APRN, MSN, FNP-C [facility] [address] [place], [place] [address] [phone]

## 2020-07-18 NOTE — ED Provider Notes (Signed)
Emergency Medicine Provider Triage Evaluation Note  Michael [name] 45 y.o. male was evaluated in triage.  Pt complains of headache, nausea/vomiting.  He reports that he has history of subdural hematoma.  He was admitted the beginning of the month.  Recent CT done a week ago showed continued hematoma.  He states last night, he started developing headache.  He states it is in both temples.  He then had associated nausea, vomiting.  He does not recall any trauma, injury to his head.  He does states that he will flop down on the bed and hit his head on the pillow but he has not noted any other injury.  Denies any numbness/weakness of his arms or legs.  No fevers.  Review of Systems  Positive: Headache, vomiting Negative: Numbness/weakness of arms or legs, fever  Physical Exam  BP 134/82   Pulse 70   Temp 98.2 F (36.8 C) (Oral)   Resp 18   Ht 5\' 4"  (1.626 m)   Wt 65.8 kg   SpO2 100%   BMI 24.89 kg/m  Gen:   Awake, no distress.  Appears uncomfortable in no acute distress HEENT:  Atraumatic.  No underlying skull deformity or crepitus noted.  Pupils equal and reactive bilaterally. Resp:  Normal effort  Cardiac:  Normal rate  Abd:   Nondistended, nontender  MSK:   Moves extremities without difficulty Neuro:  Speech clear. CN III-CN XII intact.  No slurred speech.  5/5 strength of BUE and BLE.  Medical Decision Making  Medically screening exam initiated at 3:55 AM.  Appropriate orders placed.  Michael [name] was informed that the remainder of the evaluation will be completed by another provider, this initial triage assessment does not replace that evaluation, and the importance of remaining in the ED until their evaluation is complete.   Clinical Impression  HA, vomiting   Portions of this note were generated with Dragon dictation software. Dictation errors may occur despite best attempts at proofreading.     Volanda Napoleon, PA-C 07/18/20 1436    Lacretia Leigh, MD 07/19/20 [phone]

## 2020-07-18 NOTE — ED Provider Notes (Signed)
[facility] EMERGENCY DEPARTMENT Provider Note   CSN: [id number] Arrival date & time: 07/18/20  1441     History Chief Complaint  Patient presents with  . Nausea  . Headache    Michael [name] is a 45 y.o. male history of ESRD on dialysis (last HD was today), hypertension, recent subdural hematoma who presenting with headaches.  Patient states that he was recently admitted for subdural hematoma and was discharged on 4/18.  He states that he was discharged with some hydrocodone.  He states that since then he has been having persistent headaches.  Over the last 3 days, the headaches got worse.  It is posterior in nature.  He states that he laid down in his bed very hard yesterday and the headache got suddenly worse around 1 AM.  He states that it is constant in nature.  Denies any falls or trouble speaking or focal weakness.  He did 2 hours of dialysis and came over for evaluation.  Patient is not on blood thinners.  Patient did not have a follow-up with neurosurgery since he left the hospital.   The history is provided by the patient.       Past Medical History:  Diagnosis Date  . Gout   . History of renal dialysis    M-W-F  . HLD (hyperlipidemia)   . Hypertension   . Pneumonia   . Polycystic kidney disease   . Sleep apnea 07/29/2018   uses cpap nightly  . Vitamin D deficiency 11/2018    Patient Active Problem List   Diagnosis Date Noted  . SDH (subdural hematoma) ([place]) 07/04/2020  . AKI (acute kidney injury) ([place]) 12/07/2019  . Uremia 12/02/2019  . Chest pain 12/02/2019  . Elevated troponin 12/02/2019  . Anemia in CKD (chronic kidney disease) 12/02/2019  . Adjustment disorder with mixed anxiety and depressed mood 11/28/2019  . Renal failure 11/25/2019  . Hyperkalemia 11/25/2019  . Normocytic anemia 11/25/2019  . Metabolic acidosis, increased anion gap   . Hematochezia   . Chronic gout due to renal impairment without tophus 12/14/2018  . Polycystic  kidney disease 11/09/2016  . ESRD on hemodialysis ([place]) 11/09/2016  . Observed sleep apnea 09/01/2016  . Excessive daytime sleepiness 09/01/2016  . Primary snoring 09/01/2016  . Acute pyelonephritis 05/25/2016  . Acute gouty arthritis 05/25/2016  . Hematuria 05/25/2016  . Essential hypertension, benign 05/25/2016  . Class 1 obesity due to excess calories with serious comorbidity and body mass index (BMI) of 34.0 to 34.9 in adult 05/25/2016  . Hypertensive crisis 05/23/2016  . Adult polycystic kidney disease 05/23/2016  . SIRS (systemic inflammatory response syndrome) ([place]) 05/23/2016    Past Surgical History:  Procedure Laterality Date  . AV FISTULA PLACEMENT Left 03/24/2020   Procedure: INSERTION OF LEFT UPPER EXTREMITY ARTERIOVENOUS (AV) GORE-TEX GRAFT;  Surgeon: [name], MD;  Location: [facility];  Service: Vascular;  Laterality: Left;  PERIPHERAL NERVE BLOCK  . BASCILIC VEIN TRANSPOSITION Left 12/11/2019   Procedure: 1ST STAGE BASILIC TRANSPOSITION OF LEFT ARM;  Surgeon: Angelia Mould, MD;  Location: [facility];  Service: Vascular;  Laterality: Left;  . IR FLUORO GUIDE CV LINE RIGHT  12/09/2019  . IR US GUIDE VASC ACCESS RIGHT  12/09/2019  . LEFT HEART CATH AND CORONARY ANGIOGRAPHY N/A 12/09/2019   Procedure: LEFT HEART CATH AND CORONARY ANGIOGRAPHY;  Surgeon: Adrian Prows, MD;  Location: [facility];  Service: Cardiovascular;  Laterality: N/A;  Family History  Problem Relation Age of Onset  . Bipolar disorder Father   . Polycystic kidney disease Neg Hx     Social History   Tobacco Use  . Smoking status: Former Smoker    Packs/day: 0.20    Years: 10.00    Pack years: 2.00    Types: Cigarettes    Quit date: 2015    Years since quitting: 7.3  . Smokeless tobacco: Never Used  Vaping Use  . Vaping Use: Never used  Substance Use Topics  . Alcohol use: Not Currently    Comment: occasional wine  . Drug use: No    Home Medications Prior to Admission  medications   Medication Sig Start Date End Date Taking? Authorizing Provider  allopurinol (ZYLOPRIM) 100 MG tablet Take 100 mg by mouth 2 (two) times daily as needed (Gout).    [provider]  Ascorbic Acid (VITAMIN C GUMMIE PO) Take 1 tablet by mouth every morning.    [provider]  calcium acetate (PHOSLO) 667 MG capsule Take 2,001 mg by mouth 3 (three) times daily with meals.    [provider]  HYDROcodone-acetaminophen (NORCO/VICODIN) 5-325 MG tablet Take 1 tablet by mouth every 6 (six) hours as needed for moderate pain. 07/06/20   [name], MD  lidocaine-prilocaine (EMLA) cream Apply 1 application topically See admin instructions. Apply topically to port access one hour prior to dialysis - Monday, Wednesday, Friday    [provider]  Multiple Vitamin (MULTIVITAMIN WITH MINERALS) TABS tablet Take 1 tablet by mouth every morning.    [provider]  PRESCRIPTION MEDICATION Inhale into the lungs at bedtime. CPAP    [provider]  isosorbide dinitrate (ISORDIL) 10 MG tablet Take 1 tablet (10 mg total) by mouth 2 (two) times daily. Patient not taking: No sig reported 02/04/20 03/24/20  [name], FNP    Allergies    Patient has no known allergies.  Review of Systems   Review of Systems  Neurological: Positive for headaches.  All other systems reviewed and are negative.   Physical Exam Updated Vital Signs BP 123/72 (BP Location: Right Arm)   Pulse 76   Temp 98.5 F (36.9 C) (Oral)   Resp 16   SpO2 97%   Physical Exam Vitals and nursing note reviewed.  Constitutional:      Comments: Uncomfortable  HENT:     Head: Normocephalic.  Eyes:     Extraocular Movements: Extraocular movements intact.     Pupils: Pupils are equal, round, and reactive to light.  Cardiovascular:     Rate and Rhythm: Normal rate and regular rhythm.     Heart sounds: Normal heart sounds.  Pulmonary:     Effort: Pulmonary effort is  normal.     Breath sounds: Normal breath sounds.  Abdominal:     General: Bowel sounds are normal.     Palpations: Abdomen is soft.  Musculoskeletal:        General: Normal range of motion.  Skin:    General: Skin is warm.  Neurological:     Mental Status: He is alert and oriented to person, place, and time.     Comments: CN 2-12 intact, nl strength throughout, nl finger to nose bilaterally, no obvious facial droop   Psychiatric:        Mood and Affect: Mood normal.        Behavior: Behavior normal.     ED Results / Procedures / Treatments  Labs (all labs ordered are listed, but only abnormal results are displayed) Labs Reviewed  BASIC METABOLIC PANEL - Abnormal; Notable for the following components:      Result Value   BUN 52 (*)    Creatinine, Ser 8.94 (*)    Calcium 8.0 (*)    GFR, Estimated 7 (*)    All other components within normal limits  CBC WITH DIFFERENTIAL/PLATELET - Abnormal; Notable for the following components:   Hemoglobin 12.0 (*)    HCT 38.3 (*)    All other components within normal limits    EKG None  Radiology CT Head Wo Contrast  Result Date: 07/18/2020 CLINICAL DATA:  Nausea and vomiting, headache, migraine EXAM: CT HEAD WITHOUT CONTRAST TECHNIQUE: Contiguous axial images were obtained from the base of the skull through the vertex without intravenous contrast. COMPARISON:  07/05/2020 FINDINGS: Brain: Since the previous exam, there has been interval development of new acute component to the subdural hematoma seen previously. Along the right frontal region the subdural hematoma now measures 10 mm in thickness. Along the right parietal region the subdural hematoma measures up to 15 mm in thickness. There is diffuse sulcal effacement throughout the right cerebral hemisphere, with leftward midline shift measuring approximately 8 mm at the level of the septum pellucidum. No evidence of acute infarct. Mild effacement of the lateral ventricles due to the mass  effect described above. Vascular: No hyperdense vessel or unexpected calcification. Skull: Normal. Negative for fracture or focal lesion. Sinuses/Orbits: Minimal polypoid mucosal thickening of the maxillary sinuses, stable. Other: None. IMPRESSION: 1. Acute on chronic right-sided subdural hematoma as above, with mass effect and leftward midline shift measuring 8 mm. 2. No acute infarct. Critical Value/emergent results were called by telephone at the time of interpretation on 07/18/2020 at 6:34 pm to provider DR Darl Householder , who verbally acknowledged these results. Electronically Signed   By: Randa Ngo M.D.   On: 07/18/2020 18:37    Procedures Procedures   CRITICAL CARE Performed by: Wandra Arthurs   Total critical care time: 30 minutes  Critical care time was exclusive of separately billable procedures and treating other patients.  Critical care was necessary to treat or prevent imminent or life-threatening deterioration.  Critical care was time spent personally by me on the following activities: development of treatment plan with patient and/or surrogate as well as nursing, discussions with consultants, evaluation of patient's response to treatment, examination of patient, obtaining history from patient or surrogate, ordering and performing treatments and interventions, ordering and review of laboratory studies, ordering and review of radiographic studies, pulse oximetry and re-evaluation of patient's condition.   Medications Ordered in ED Medications  metoCLOPramide (REGLAN) injection 10 mg (has no administration in time range)  diphenhydrAMINE (BENADRYL) injection 25 mg (has no administration in time range)  fentaNYL (SUBLIMAZE) injection 50 mcg (has no administration in time range)  ondansetron (ZOFRAN-ODT) disintegrating tablet 4 mg (4 mg Oral Given 07/18/20 1447)    ED Course  I have reviewed the triage vital signs and the nursing notes.  Pertinent labs & imaging results that were  available during my care of the patient were reviewed by me and considered in my medical decision making (see chart for details).    MDM Rules/Calculators/A&P                         Jamion Carter is a 45 y.o. male here presenting with headaches.  Patient has history of subdural hemorrhage  recently. Patient has persistent headache.  Nonfocal neuro exam.  Will give migraine cocktail and get CT head  7:02 PM CT head showed acute on chronic right-sided subdural hemorrhage.  Patient has nonfocal neuro exam right now.  I consulted Dr. Reatha Armour to see patient.  I also let Dr. Caprice Kluver, nephrologist on call, know about the patient. Hospitalist to admit     Final Clinical Impression(s) / ED Diagnoses Final diagnoses:  None    Rx / DC Orders ED Discharge Orders    None       Drenda Freeze, MD 07/18/20 [phone]

## 2020-07-18 NOTE — Telephone Encounter (Signed)
Pt asking for pain medicine for he headaches.

## 2020-07-18 NOTE — ED Notes (Signed)
Attempted to give report, this rn asked to call back.

## 2020-07-19 ENCOUNTER — Other Ambulatory Visit: Payer: Self-pay

## 2020-07-19 ENCOUNTER — Ambulatory Visit: Payer: Self-pay | Admitting: Family Medicine

## 2020-07-19 ENCOUNTER — Inpatient Hospital Stay (HOSPITAL_COMMUNITY): Payer: 59

## 2020-07-19 DIAGNOSIS — E669 Obesity, unspecified: Secondary | ICD-10-CM

## 2020-07-19 DIAGNOSIS — N186 End stage renal disease: Secondary | ICD-10-CM | POA: Diagnosis not present

## 2020-07-19 DIAGNOSIS — S065XAA Traumatic subdural hemorrhage with loss of consciousness status unknown, initial encounter: Secondary | ICD-10-CM | POA: Diagnosis present

## 2020-07-19 DIAGNOSIS — I10 Essential (primary) hypertension: Secondary | ICD-10-CM | POA: Diagnosis not present

## 2020-07-19 DIAGNOSIS — S065X9A Traumatic subdural hemorrhage with loss of consciousness of unspecified duration, initial encounter: Secondary | ICD-10-CM | POA: Diagnosis not present

## 2020-07-19 DIAGNOSIS — G4730 Sleep apnea, unspecified: Secondary | ICD-10-CM

## 2020-07-19 LAB — CBC
HCT: 32.9 % — ABNORMAL LOW (ref 39.0–52.0)
Hemoglobin: 10.4 g/dL — ABNORMAL LOW (ref 13.0–17.0)
MCH: 27.9 pg (ref 26.0–34.0)
MCHC: 31.6 g/dL (ref 30.0–36.0)
MCV: 88.2 fL (ref 80.0–100.0)
Platelets: 279 10*3/uL (ref 150–400)
RBC: 3.73 MIL/uL — ABNORMAL LOW (ref 4.22–5.81)
RDW: 14.2 % (ref 11.5–15.5)
WBC: 9.9 10*3/uL (ref 4.0–10.5)
nRBC: 0 % (ref 0.0–0.2)

## 2020-07-19 LAB — COMPREHENSIVE METABOLIC PANEL
ALT: 19 U/L (ref 0–44)
AST: 14 U/L — ABNORMAL LOW (ref 15–41)
Albumin: 3.5 g/dL (ref 3.5–5.0)
Alkaline Phosphatase: 36 U/L — ABNORMAL LOW (ref 38–126)
Anion gap: 15 (ref 5–15)
BUN: 63 mg/dL — ABNORMAL HIGH (ref 6–20)
CO2: 26 mmol/L (ref 22–32)
Calcium: 7.4 mg/dL — ABNORMAL LOW (ref 8.9–10.3)
Chloride: 94 mmol/L — ABNORMAL LOW (ref 98–111)
Creatinine, Ser: 10.94 mg/dL — ABNORMAL HIGH (ref 0.61–1.24)
GFR, Estimated: 5 mL/min — ABNORMAL LOW (ref >60)
Glucose, Bld: 97 mg/dL (ref 70–99)
Potassium: 5 mmol/L (ref 3.5–5.1)
Sodium: 135 mmol/L (ref 135–145)
Total Bilirubin: 0.1 mg/dL — ABNORMAL LOW (ref 0.3–1.2)
Total Protein: 7.3 g/dL (ref 6.5–8.1)

## 2020-07-19 MED ORDER — HYDROCODONE-ACETAMINOPHEN 5-325 MG PO TABS: 1.0000 | ORAL_TABLET | ORAL | 0 refills | Status: DC | PRN

## 2020-07-19 MED ORDER — DEXAMETHASONE 4 MG PO TABS: ORAL_TABLET | ORAL | 0 refills | Status: AC

## 2020-07-19 MED ORDER — LEVETIRACETAM 500 MG PO TABS: 500.0000 mg | ORAL_TABLET | Freq: Two times a day (BID) | ORAL | 0 refills | Status: DC

## 2020-07-19 MED ORDER — CHLORHEXIDINE GLUCONATE CLOTH 2 % EX PADS
6.0000 | MEDICATED_PAD | Freq: Every day | CUTANEOUS | Status: DC
  Administered 2020-07-19: 6 via TOPICAL

## 2020-07-19 MED ORDER — LEVETIRACETAM 500 MG PO TABS: 500.0000 mg | ORAL_TABLET | Freq: Every day | ORAL | Status: DC

## 2020-07-19 MED ORDER — DEXAMETHASONE SODIUM PHOSPHATE 4 MG/ML IJ SOLN: INTRAMUSCULAR | 0 refills | Status: DC

## 2020-07-19 MED ORDER — LEVETIRACETAM 500 MG PO TABS: 500.0000 mg | ORAL_TABLET | Freq: Every day | ORAL | 0 refills | Status: DC

## 2020-07-19 MED ORDER — DEXAMETHASONE SODIUM PHOSPHATE 4 MG/ML IJ SOLN
4.0000 mg | Freq: Four times a day (QID) | INTRAMUSCULAR | Status: DC
  Administered 2020-07-19 (×2): 4 mg via INTRAVENOUS
  Filled 2020-07-19 (×2): qty 1

## 2020-07-19 MED ORDER — HYDROCODONE-ACETAMINOPHEN 5-325 MG PO TABS
1.0000 | ORAL_TABLET | ORAL | Status: DC | PRN
  Administered 2020-07-19 (×3): 1 via ORAL
  Filled 2020-07-19 (×3): qty 1

## 2020-07-19 MED ORDER — SENNOSIDES-DOCUSATE SODIUM 8.6-50 MG PO TABS: 1.0000 | ORAL_TABLET | Freq: Every evening | ORAL | Status: DC | PRN

## 2020-07-19 NOTE — Care Management Obs Status (Signed)
[facility] NOTIFICATION   Patient Details  Name: Michael [name] MRN: [id number] Date of Birth: 1975-12-07   Medicare Observation Status Notification Given:  Yes    Pollie Friar, RN 07/19/2020, 4:03 PM

## 2020-07-19 NOTE — Progress Notes (Signed)
   Providing Compassionate, Quality Care - Together  NEUROSURGERY PROGRESS NOTE   S: No issues overnight. HA controlled on hydrocodone, denies numbness, tingling weakness or N/v  O: EXAM:  BP 124/69 (BP Location: Right Arm)   Pulse 71   Temp 97.7 F (36.5 C) (Oral)   Resp 18   Ht 6\' 1"  (1.854 m)   Wt 120 kg   SpO2 100%   BMI 34.90 kg/m   Awake, alert, oriented x3 PERRL EOMI Speech fluent, appropriate  CNs grossly intact  5/5 BUE/BLE  No drift  ASSESSMENT:  45 y.o. male with   1. Acute on chronic SDH, right  PLAN: - rec pain control and eval by pt/ot - okay to eat - d/w patient, recommend pain control and followup as outpatient at this time with repeat CT in 2 weeks and pain control - keppra x7 days - decadron x7 days    Thank you for allowing me to participate in this patient's care.  Please do not hesitate to call with questions or concerns.   Elwin Sleight, [facility] Cell: [phone]

## 2020-07-19 NOTE — Consult Note (Incomplete)
Renal Service Consult Note [facility]  Michael [name] 07/19/2020 Sol Blazing, MD Requesting Physician: Dr. [name]  Reason for Consult: ESRD pt w/ headaches, recent admit for SDH HPI: The patient is a 45 y.o. year-old ***      ROS  denies CP  no joint pain   no HA  no blurry vision  no rash  no diarrhea  no nausea/ vomiting  no dysuria  no difficulty voiding  no change in urine color    Past Medical History  Past Medical History:  Diagnosis Date  . Gout   . History of renal dialysis    M-W-F  . HLD (hyperlipidemia)   . Hypertension   . Pneumonia   . Polycystic kidney disease   . Sleep apnea 07/29/2018   uses cpap nightly  . Vitamin D deficiency 11/2018   Past Surgical History  Past Surgical History:  Procedure Laterality Date  . AV FISTULA PLACEMENT Left 03/24/2020   Procedure: INSERTION OF LEFT UPPER EXTREMITY ARTERIOVENOUS (AV) GORE-TEX GRAFT;  Surgeon: [name], MD;  Location: [facility];  Service: Vascular;  Laterality: Left;  PERIPHERAL NERVE BLOCK  . BASCILIC VEIN TRANSPOSITION Left 12/11/2019   Procedure: 1ST STAGE BASILIC TRANSPOSITION OF LEFT ARM;  Surgeon: Angelia Mould, MD;  Location: [facility];  Service: Vascular;  Laterality: Left;  . IR FLUORO GUIDE CV LINE RIGHT  12/09/2019  . IR US GUIDE VASC ACCESS RIGHT  12/09/2019  . LEFT HEART CATH AND CORONARY ANGIOGRAPHY N/A 12/09/2019   Procedure: LEFT HEART CATH AND CORONARY ANGIOGRAPHY;  Surgeon: Adrian Prows, MD;  Location: [facility];  Service: Cardiovascular;  Laterality: N/A;   Family History  Family History  Problem Relation Age of Onset  . Bipolar disorder Father   . Polycystic kidney disease Neg Hx    Social History  reports that he quit smoking about 7 years ago. His smoking use included cigarettes. He has a 2.00 pack-year smoking history. He has never used smokeless tobacco. He reports previous alcohol use. He reports that he does not use drugs. Allergies No Known  Allergies Home medications Prior to Admission medications   Medication Sig Start Date End Date Taking? Authorizing Provider  acetaminophen (TYLENOL) 500 MG tablet Take 1,000 mg by mouth every 6 (six) hours as needed for mild pain.   Yes [provider]  allopurinol (ZYLOPRIM) 100 MG tablet Take 100 mg by mouth 2 (two) times daily as needed (Gout).   Yes [provider]  Ascorbic Acid (VITAMIN C GUMMIE PO) Take 1 tablet by mouth every morning.   Yes [provider]  calcium acetate (PHOSLO) 667 MG capsule Take 2,001 mg by mouth 3 (three) times daily with meals.   Yes [provider]  lidocaine-prilocaine (EMLA) cream Apply 1 application topically See admin instructions. Apply topically to port access one hour prior to dialysis - Monday, Wednesday, Friday   Yes [provider]  Multiple Vitamin (MULTIVITAMIN WITH MINERALS) TABS tablet Take 1 tablet by mouth every morning.   Yes [provider]  PRESCRIPTION MEDICATION Inhale into the lungs at bedtime. CPAP   Yes [provider]  HYDROcodone-acetaminophen (NORCO/VICODIN) 5-325 MG tablet Take 1 tablet by mouth every 6 (six) hours as needed for moderate pain. Patient not taking: Reported on 07/19/2020 07/06/20   [name], MD  isosorbide dinitrate (ISORDIL) 10 MG tablet Take 1 tablet (10 mg total) by mouth 2 (two) times daily. Patient not taking: No sig reported  02/04/20 03/24/20  [name], FNP     Vitals:   07/19/20 0023 07/19/20 0439 07/19/20 0747 07/19/20 1222  BP:  124/69 121/64 131/67  Pulse:  71 64 76  Resp:  18 16 18   Temp:  97.7 F (36.5 C) 98.1 F (36.7 C) 98 F (36.7 C)  TempSrc:  Oral Oral Oral  SpO2: 100% 100% 100% 97%  Weight:      Height:       Exam Gen *** No rash, cyanosis or gangrene Sclera anicteric, throat clear ***  No jvd or bruits *** Chest clear bilat *** RRR no MRG *** Abd soft ntnd no mass or ascites +bs *** GU normal *** defer MS no  joint effusions or deformity *** Ext *** edema, no wounds or ulcers *** Neuro is alert, Ox 3 , nf ***          OP HD: MWF AF   4h 72min   ***   450/1.5   2/2 bath   AVG 15g   NO HEPARIN (due to subdura hematoma)  - ***  Assessment/ Plan: 1. ***      Kelly Splinter  MD 07/19/2020, 1:37 PM  Recent Labs  Lab 07/18/20 1449 07/19/20 0501  WBC 9.3 9.9  HGB 12.0* 10.4*   Recent Labs  Lab 07/18/20 1449 07/19/20 0501  K 4.8 5.0  BUN 52* 63*  CREATININE 8.94* 10.94*  CALCIUM 8.0* 7.4*

## 2020-07-19 NOTE — Discharge Summary (Signed)
Physician Discharge Summary  Michael [name] OEV:[id number] DOB: [birth year] DOA: 07/18/2020  PCP: [name], FNP  Admit date: 07/18/2020 Discharge date: 07/19/2020  Time spent: 45 minutes  Recommendations for Outpatient Follow-up:  Patient will be discharged to home.  Patient will need to follow up with primary care provider within one week of discharge.  Follow-up with Dr. Reatha Armour, neurosurgery. Continue hemodialysis as scheduled.  Patient should continue medications as prescribed.  Patient should follow a renal diet.    Discharge Diagnoses:  Acute on subacute subdural hematoma ESRD Essential hypertension Morbid obesity Sleep apnea  Discharge Condition: Stable  Diet recommendation: renal  Filed Weights   07/18/20 1911  Weight: 120 kg    History of present illness:  On 07/18/2020 by Dr. Wynetta Fines Michael [name] is a 45 y.o. male with medical history significant of recently subdural hematoma, ESRD on HD (polycystic kidney disease), HTN, HLD, presented with worsening of headache.  Patient brought developed severe right-sided headache came to hospital 2 weeks ago was found to have spontaneous acute subdural hematoma.  After observation for 2 days and repeat imaging shows stabilized bleeding status, patient discharged home with pain medications.  However, patient reported that right-sided headache persisted, and he currently got partial leave with oxycodone which he ran out last week.  Since then he has experienced persistent right-sided headache, 8-9/10, dull ache, meantime he has been having blurring of frequent nauseous and vomited 1-2 times a day since last week, usually smaller content, no palpable blood.  Denied any double vision, blurry vision, weakness or numbness of any double limbs.  Denies trauma or fall. No LOC. No significant extra sleep cycle disturbance or fatigue.  Hospital Course:  Acute on subacute subdural hematoma associated with headaches -Currently no focal  deficits -CT head showed acute on chronic subdural hematoma with 5 mm of leftward midline shift -Patient complaining of headache which has been ongoing -PT evaluated patient and no further therapy needed  -Neurosurgery consulted and appreciated-placed on Keppra as well as Decadron x 7 days -patient complaining of headaches which have been debilitating- will discharge patient with hydrocodone  ESRD -Patient dialyzes on Monday, Wednesday, Friday -Nephrology made aware of patient's admission -Patient did dialyze at outpatient dialysis center on 07/20/2020  Essential hypertension -BP stable, goal 130/80 given subdural hematoma  Morbid obesity -Patient to follow-up with PCP for lifestyle modifications  Sleep apnea -Continue CPAP -Patient states that he had a malfunctioning CPAP, TOC consulted  Procedures: None  Consultations: Neurosurgery  Discharge Exam: Vitals:   07/19/20 0747 07/19/20 1222  BP: 121/64 131/67  Pulse: 64 76  Resp: 16 18  Temp: 98.1 F (36.7 C) 98 F (36.7 C)  SpO2: 100% 97%     General: Well developed, well nourished, NAD, appears stated age  HEENT: NCAT, PERRLA, EOMI, Anicteic Sclera, mucous membranes moist.  Neck: Supple, no JVD, no masses  Cardiovascular: S1 S2 auscultated, no rubs, murmurs or gallops. Regular rate and rhythm.  Respiratory: Clear to auscultation bilaterally with equal chest rise  Abdomen: Soft, obese, nontender, nondistended, + bowel sounds  Extremities: warm dry without cyanosis clubbing or edema  Neuro: AAOx3, cranial nerves grossly intact. Strength 5/5 in patient's upper and lower extremities bilaterally  Skin: Without rashes exudates or nodules  Psych: Normal affect and demeanor with intact judgement and insight  Discharge Instructions Discharge Instructions    Discharge instructions   Complete by: As directed    Patient will need to follow up with primary care provider within one  week of discharge.  Follow-up with  Dr. Reatha Armour, neurosurgery. Continue hemodialysis as scheduled.  Patient should continue medications as prescribed.  Patient should follow a renal diet.   Increase activity slowly   Complete by: As directed      Allergies as of 07/19/2020   No Known Allergies     Medication List    TAKE these medications   acetaminophen 500 MG tablet Commonly known as: TYLENOL Take 1,000 mg by mouth every 6 (six) hours as needed for mild pain.   allopurinol 100 MG tablet Commonly known as: ZYLOPRIM Take 100 mg by mouth 2 (two) times daily as needed (Gout).   calcium acetate 667 MG capsule Commonly known as: PHOSLO Take 2,001 mg by mouth 3 (three) times daily with meals.   dexamethasone 4 MG/ML injection Commonly known as: DECADRON Inject 1 mL (4 mg total) into the vein every 6 (six) hours for 2 days, THEN 1 mL (4 mg total) every 12 (twelve) hours for 2 days, THEN 0.5 mLs (2 mg total) every 8 (eight) hours for 2 days, THEN 0.5 mLs (2 mg total) every 12 (twelve) hours for 2 days, THEN 0.5 mLs (2 mg total) daily for 2 days. Start taking on: Jul 19, 2020   HYDROcodone-acetaminophen 5-325 MG tablet Commonly known as: NORCO/VICODIN Take 1 tablet by mouth every 4 (four) hours as needed for moderate pain. What changed: when to take this   levETIRAcetam 500 MG tablet Commonly known as: KEPPRA Take 1 tablet (500 mg total) by mouth at bedtime for 7 days. Start taking on: Jul 20, 2020   lidocaine-prilocaine cream Commonly known as: EMLA Apply 1 application topically See admin instructions. Apply topically to port access one hour prior to dialysis - Monday, Wednesday, Friday   multivitamin with minerals Tabs tablet Take 1 tablet by mouth every morning.   PRESCRIPTION MEDICATION Inhale into the lungs at bedtime. CPAP   senna-docusate 8.6-50 MG tablet Commonly known as: Senokot-S Take 1 tablet by mouth at bedtime as needed for mild constipation.   VITAMIN C GUMMIE PO Take 1 tablet by mouth every  morning.            Durable Medical Equipment  (From admission, onward)         Start     Ordered   07/19/20 1331  For home use only DME continuous positive airway pressure (CPAP)  Once       Comments: Patient has benefited from the use of the CPAP prior to the previous machine malfunction.  Question Answer Comment  Length of Need Lifetime   Patient has OSA or probable OSA Yes   Is the patient currently using CPAP in the home Yes   Settings Autotitration   CPAP supplies needed Mask, headgear, cushions, filters, heated tubing and water chamber      07/19/20 1333         No Known Allergies  Follow-up Information    Dawley, Troy C, DO. Schedule an appointment as soon as possible for a visit in 1 week(s).   Why: Hospital follow up Contact information: [address] [place] [address] [phone]        [name], FNP. Schedule an appointment as soon as possible for a visit in 1 week(s).   Specialty: Family Medicine Why: Hospital follow up Contact information: [place]. [address] [place] [address] [phone]  The results of significant diagnostics from this hospitalization (including imaging, microbiology, ancillary and laboratory) are listed below for reference.    Significant Diagnostic Studies: CT HEAD WO CONTRAST  Result Date: 07/19/2020 CLINICAL DATA:  Subdural hematoma follow-up EXAM: CT HEAD WITHOUT CONTRAST TECHNIQUE: Contiguous axial images were obtained from the base of the skull through the vertex without intravenous contrast. COMPARISON:  07/18/2020 FINDINGS: Brain: Unchanged appearance of right hemispheric subdural hematoma measuring up to 13 mm in thickness. Leftward midline shift of 5 mm is unchanged. No hydrocephalus. No new site of hemorrhage. Vascular: No hyperdense vessel or unexpected calcification. Skull: Normal. Negative for fracture or focal lesion. Sinuses/Orbits: No acute finding. Other:  None. IMPRESSION: Unchanged appearance of right hemispheric acute on chronic subdural hematoma with 5 mm of leftward midline shift. Electronically Signed   By: Ulyses Jarred M.D.   On: 07/19/2020 03:06   CT Head Wo Contrast  Result Date: 07/18/2020 CLINICAL DATA:  Nausea and vomiting, headache, migraine EXAM: CT HEAD WITHOUT CONTRAST TECHNIQUE: Contiguous axial images were obtained from the base of the skull through the vertex without intravenous contrast. COMPARISON:  07/05/2020 FINDINGS: Brain: Since the previous exam, there has been interval development of new acute component to the subdural hematoma seen previously. Along the right frontal region the subdural hematoma now measures 10 mm in thickness. Along the right parietal region the subdural hematoma measures up to 15 mm in thickness. There is diffuse sulcal effacement throughout the right cerebral hemisphere, with leftward midline shift measuring approximately 8 mm at the level of the septum pellucidum. No evidence of acute infarct. Mild effacement of the lateral ventricles due to the mass effect described above. Vascular: No hyperdense vessel or unexpected calcification. Skull: Normal. Negative for fracture or focal lesion. Sinuses/Orbits: Minimal polypoid mucosal thickening of the maxillary sinuses, stable. Other: None. IMPRESSION: 1. Acute on chronic right-sided subdural hematoma as above, with mass effect and leftward midline shift measuring 8 mm. 2. No acute infarct. Critical Value/emergent results were called by telephone at the time of interpretation on 07/18/2020 at 6:34 pm to provider DR Darl Householder , who verbally acknowledged these results. Electronically Signed   By: Randa Ngo M.D.   On: 07/18/2020 18:37   CT HEAD WO CONTRAST  Result Date: 07/05/2020 CLINICAL DATA:  Follow-up subdural hemorrhage EXAM: CT HEAD WITHOUT CONTRAST TECHNIQUE: Contiguous axial images were obtained from the base of the skull through the vertex without intravenous  contrast. COMPARISON:  Yesterday FINDINGS: Brain: Primarily isointense subdural hematoma along the right cerebral convexity measuring 10 mm in maximum, unchanged from yesterday. Possible small hygroma along the left frontal convexity without clear cortical mass effect. Midline shift is 3 mm and non progressed. No entrapment or infarct seen. Vascular: Negative Skull: Negative Sinuses/Orbits: No visible injury IMPRESSION: Unchanged subdural hematoma on the right. Midline shift is unchanged at 3 mm. Electronically Signed   By: Monte Fantasia M.D.   On: 07/05/2020 06:46   CT Head Wo Contrast  Result Date: 07/04/2020 CLINICAL DATA:  Headache, intracranial hemorrhage suspected. EXAM: CT HEAD WITHOUT CONTRAST TECHNIQUE: Contiguous axial images were obtained from the base of the skull through the vertex without intravenous contrast. COMPARISON:  CT head February 02, 2020. FINDINGS: Brain: Approximately 11 mm mixed density right cerebral convexity subdural hemorrhage. Areas of internal hyperdensity are compatible with acute/recent hemorrhage. Approximately 2-3 mm of leftward midline shift at the foramen of [place]. Basal cisterns are patent. No hydrocephalus. No evidence of acute large vascular territory infarct. Vascular: No hyperdense  vessel. Skull: No acute fracture. Sinuses/Orbits: Remote right medial orbital wall fracture. Otherwise, unremarkable orbits. Mild polypoid mucosal thickening in the inferior maxillary sinuses. No air-fluid levels. Other: No mastoid effusions. IMPRESSION: Approximately 11 mm mixed density right cerebral convexity subdural hemorrhage. Areas of internal hyperdensity are compatible with acute/recent hemorrhage. Resulting 2-3 mm of leftward midline shift. Findings discussed with Dr. Billy Fischer via telephone at 3:05 PM. Electronically Signed   By: Margaretha Sheffield MD   On: 07/04/2020 15:09    Microbiology: Recent Results (from the past 240 hour(s))  Resp Panel by RT-PCR (Flu A&B, Covid)  Nasopharyngeal Swab     Status: None   Collection Time: 07/18/20  6:48 PM   Specimen: Nasopharyngeal Swab; Nasopharyngeal(NP) swabs in vial transport medium  Result Value Ref Range Status   SARS Coronavirus 2 by RT PCR NEGATIVE NEGATIVE Final    Comment: (NOTE) SARS-CoV-2 target nucleic acids are NOT DETECTED.  The SARS-CoV-2 RNA is generally detectable in upper respiratory specimens during the acute phase of infection. The lowest concentration of SARS-CoV-2 viral copies this assay can detect is 138 copies/mL. A negative result does not preclude SARS-Cov-2 infection and should not be used as the sole basis for treatment or other patient management decisions. A negative result may occur with  improper specimen collection/handling, submission of specimen other than nasopharyngeal swab, presence of viral mutation(s) within the areas targeted by this assay, and inadequate number of viral copies(<138 copies/mL). A negative result must be combined with clinical observations, patient history, and epidemiological information. The expected result is Negative.  Fact Sheet for Patients:  EntrepreneurPulse.com.au  Fact Sheet for Healthcare Providers:  IncredibleEmployment.be  This test is no t yet approved or cleared by the [place] FDA and  has been authorized for detection and/or diagnosis of SARS-CoV-2 by FDA under an Emergency Use Authorization (EUA). This EUA will remain  in effect (meaning this test can be used) for the duration of the COVID-19 declaration under Section 564(b)(1) of the Act, 21 U.S.C.section 360bbb-3(b)(1), unless the authorization is terminated  or revoked sooner.       Influenza A by PCR NEGATIVE NEGATIVE Final   Influenza B by PCR NEGATIVE NEGATIVE Final    Comment: (NOTE) The Xpert Xpress SARS-CoV-2/FLU/RSV plus assay is intended as an aid in the diagnosis of influenza from Nasopharyngeal swab specimens and should not be  used as a sole basis for treatment. Nasal washings and aspirates are unacceptable for Xpert Xpress SARS-CoV-2/FLU/RSV testing.  Fact Sheet for Patients: EntrepreneurPulse.com.au  Fact Sheet for Healthcare Providers: IncredibleEmployment.be  This test is not yet approved or cleared by the [place] FDA and has been authorized for detection and/or diagnosis of SARS-CoV-2 by FDA under an Emergency Use Authorization (EUA). This EUA will remain in effect (meaning this test can be used) for the duration of the COVID-19 declaration under Section 564(b)(1) of the Act, 21 U.S.C. section 360bbb-3(b)(1), unless the authorization is terminated or revoked.  Performed at [facility], [place] [address] [place], [place] [address]      Labs: Basic Metabolic Panel: Recent Labs  Lab 07/18/20 1449 07/19/20 0501  NA 136 135  K 4.8 5.0  CL 98 94*  CO2 25 26  GLUCOSE 85 97  BUN 52* 63*  CREATININE 8.94* 10.94*  CALCIUM 8.0* 7.4*   Liver Function Tests: Recent Labs  Lab 07/19/20 0501  AST 14*  ALT 19  ALKPHOS 36*  BILITOT 0.1*  PROT 7.3  ALBUMIN 3.5   No  results for input(s): LIPASE, AMYLASE in the last 168 hours. No results for input(s): AMMONIA in the last 168 hours. CBC: Recent Labs  Lab 07/18/20 1449 07/19/20 0501  WBC 9.3 9.9  NEUTROABS 6.8  --   HGB 12.0* 10.4*  HCT 38.3* 32.9*  MCV 87.8 88.2  PLT 312 279   Cardiac Enzymes: No results for input(s): CKTOTAL, CKMB, CKMBINDEX, TROPONINI in the last 168 hours. BNP: BNP (last 3 results) No results for input(s): BNP in the last 8760 hours.  ProBNP (last 3 results) No results for input(s): PROBNP in the last 8760 hours.  CBG: No results for input(s): GLUCAP in the last 168 hours.     Signed:  Cristal Ford  [facility] 07/19/2020, 4:19 PM Mr.

## 2020-07-19 NOTE — Evaluation (Signed)
Physical Therapy Evaluation and Discharge Patient Details Name: Michael [name] MRN: [id number] DOB: May 12, 1975 Today's Date: 07/19/2020   History of Present Illness  Pt is a 45 y/o male admitted 5/2 secondary to worsening headache  and nausea. Pt with recent admission ~2 weeks ago after fall and found to have R hemispheric subdural hematoma. PMH includes HTN and ESRD on HD secondary to PKD.  Clinical Impression  Patient evaluated by Physical Therapy with no further acute PT needs identified. All education has been completed and the patient has no further questions. Pt with mild unsteadiness initially, but as mobility progressed, balance improved. Scored 22 on DGI indicating low fall risk. Overall at a supervision to independent level with mobility tasks. See below for any follow-up Physical Therapy or equipment needs. PT is signing off. Thank you for this referral. If needs change, please re-consult.      Follow Up Recommendations No PT follow up    Equipment Recommendations  None recommended by PT    Recommendations for Other Services       Precautions / Restrictions Precautions Precautions: Fall Restrictions Weight Bearing Restrictions: No      Mobility  Bed Mobility Overal bed mobility: Independent                  Transfers Overall transfer level: Modified independent                  Ambulation/Gait Ambulation/Gait assistance: Supervision Gait Distance (Feet): 200 Feet Assistive device: None Gait Pattern/deviations: Step-through pattern;Decreased stride length Gait velocity: WFL   General Gait Details: Mild unsteadiness noted initially, but improved with increased gait distance. No overt LOB noted when performing dynamic gait tasks.  Stairs Stairs: Yes Stairs assistance: Supervision Stair Management: One rail Right;Alternating pattern;Forwards Number of Stairs: 6 General stair comments: overall steady stair navigation. No LOB noted.  Wheelchair  Mobility    Modified Rankin (Stroke Patients Only)       Balance Overall balance assessment: Mild deficits observed, not formally tested                               Standardized Balance Assessment Standardized Balance Assessment : Dynamic Gait Index   Dynamic Gait Index Level Surface: Normal Change in Gait Speed: Normal Gait with Horizontal Head Turns: Mild Impairment Gait with Vertical Head Turns: Normal Gait and Pivot Turn: Normal Step Over Obstacle: Normal Step Around Obstacles: Normal Steps: Mild Impairment Total Score: 22       Pertinent Vitals/Pain Pain Assessment: 0-10 Pain Score: 6  Pain Location: headache Pain Descriptors / Indicators: Headache Pain Intervention(s): Limited activity within patient's tolerance;Monitored during session    Home Living Family/patient expects to be discharged to:: Private residence Living Arrangements: Alone Available Help at Discharge: Family;Available PRN/intermittently Type of Home: Apartment Home Access: Stairs to enter Entrance Stairs-Rails: Left;Right;Can reach both Entrance Stairs-Number of Steps: 2 flight Home Layout: One level Home Equipment: None      Prior Function Level of Independence: Independent               Hand Dominance        Extremity/Trunk Assessment   Upper Extremity Assessment Upper Extremity Assessment: Defer to OT evaluation    Lower Extremity Assessment Lower Extremity Assessment: Overall WFL for tasks assessed    Cervical / Trunk Assessment Cervical / Trunk Assessment: Normal  Communication   Communication: No difficulties  Cognition Arousal/Alertness: Awake/alert Behavior During Therapy:  WFL for tasks assessed/performed Overall Cognitive Status: Within Functional Limits for tasks assessed                                        General Comments      Exercises     Assessment/Plan    PT Assessment Patent does not need any further PT  services  PT Problem List         PT Treatment Interventions      PT Goals (Current goals can be found in the Care Plan section)  Acute Rehab PT Goals Patient Stated Goal: to go home PT Goal Formulation: With patient Time For Goal Achievement: 07/19/20 Potential to Achieve Goals: Good    Frequency     Barriers to discharge        Co-evaluation               AM-PAC PT "6 Clicks" Mobility  Outcome Measure Help needed turning from your back to your side while in a flat bed without using bedrails?: None Help needed moving from lying on your back to sitting on the side of a flat bed without using bedrails?: None Help needed moving to and from a bed to a chair (including a wheelchair)?: None Help needed standing up from a chair using your arms (e.g., wheelchair or bedside chair)?: None Help needed to walk in hospital room?: None Help needed climbing 3-5 steps with a railing? : None 6 Click Score: 24    End of Session Equipment Utilized During Treatment: Gait belt Activity Tolerance: Patient tolerated treatment well Patient left: in bed;with call bell/phone within reach Nurse Communication: Mobility status PT Visit Diagnosis: Other abnormalities of gait and mobility (R26.89)    Time: 1025-8527 PT Time Calculation (min) (ACUTE ONLY): 15 min   Charges:   PT Evaluation $PT Eval Low Complexity: 1 Low          Lou Miner, DPT  Acute Rehabilitation Services  Pager: [phone] Office: [phone]   Rudean Hitt 07/19/2020, 4:02 PM

## 2020-07-19 NOTE — Progress Notes (Signed)
Pt was d/c and taxi was called. Pt in no actue distress and discharge instructions were given. Pt verbalized education that was given (including medications).

## 2020-07-19 NOTE — Progress Notes (Signed)
Pt returned from CT °

## 2020-07-19 NOTE — Care Management CC44 (Signed)
Condition Code 44 Documentation Completed  Patient Details  Name: Harnoor Kohles MRN: [id number] Date of Birth: 1975/08/08   Condition Code 44 given:  Yes Patient signature on Condition Code 44 notice:  Yes Documentation of 2 MD's agreement:  Yes Code 44 added to claim:  Yes    Pollie Friar, RN 07/19/2020, 4:03 PM

## 2020-07-19 NOTE — Discharge Instructions (Addendum)
Subdural Hematoma  A subdural hematoma is a collection of blood between the brain and its outer covering (dura). As the amount of blood increases, pressure builds on the brain. There are two types of subdural hematomas:  Acute. This type develops shortly after a hard, direct hit to the head and causes blood to collect very quickly. This is a medical emergency. If it is not diagnosed and treated quickly, it can lead to severe brain injury or death.  Chronic. This is when bleeding develops more slowly, over weeks or months. In some cases, this type does not cause symptoms. What are the causes? This condition is caused by bleeding (hemorrhage) from a broken (ruptured) blood vessel. In most cases, a blood vessel ruptures and bleeds because of a head injury, such as from a hard, direct hit. Head injuries can happen in car accidents, falls, assaults, or while playing sports. In rare cases, a hemorrhage can happen without a known cause (spontaneously), especially if you take blood thinners (anticoagulants). What increases the risk? This condition is more likely to develop in:  Older people.  Infants.  People who take blood thinners.  People who have head injuries.  People who abuse alcohol. What are the signs or symptoms? Symptoms of this condition can vary depending on the size of the hematoma. Symptoms can be mild, severe, or life-threatening. They include:  Headaches.  Nausea or vomiting.  Changes in vision, such as double vision or loss of vision.  Changes in speech or trouble understanding what people say.  Loss of balance or trouble walking.  Weakness, numbness, or tingling in the arms or legs, especially on one side of the body.  Seizures.  Change in personality.  Increased sleepiness.  Memory loss.  Loss of consciousness.  Coma. Symptoms of acute subdural hematoma can develop over minutes or hours. Symptoms of chronic subdural hematoma may develop over weeks or  months. How is this diagnosed? This condition is diagnosed based on the results of:  A physical exam.  Tests of strength, reflexes, coordination, senses, manner of walking (gait), and facial and eye movements (neurological exam).  Imaging tests, such as an MRI or a CT scan. How is this treated? Treatment for this condition depends on the type of hematoma and how severe it is. Treatment for acute hematoma may include:  Emergency surgery to drain blood or remove a blood clot.  Medicines that help the body get rid of excess fluids (diuretics). These may help to reduce pressure in the brain.  Assisted breathing (ventilation). Treatment for chronic hematoma may include:  Observation and bed rest at the hospital.  Surgery. If you take blood thinners, you may need to stop taking them for a short time. You may also be given anti-seizure (anticonvulsant) medicine. Sometimes, no treatment is needed for chronic subdural hematoma. Follow these instructions at home: Activity  Avoid situations where you could injure your head again, such as in competitive sports, downhill snow sports, and horseback riding. Do not do these activities until your health care provider approves. ? Wear protective gear, such as a helmet, when participating in activities such as biking or contact sports.  Avoid too much visual stimulation while recovering. This means limiting how much you read and limiting your screen time on a smart phone, tablet, computer, or TV.  Rest as told by your health care provider. Rest helps the brain heal.  Try to avoid activities that cause physical or mental stress. Return to work or school as told by  your health care provider.  Do not lift anything that is heavier than 5 lb (2.3 kg), or the limit you are told, until your health care provider says that it is safe.  Do not drive, ride a bike, or use heavy machinery until your health care provider approves.  Always wear your seat belt  when you are in a motor vehicle. Alcohol use  Do not drink alcohol if your health care provider tells you not to drink.  If you drink alcohol, limit how much you use to: ? 0-1 drink a day for women. ? 0-2 drinks a day for men. General instructions  Monitor your symptoms, and ask people around you to do the same. Recovery from brain injuries varies. Talk with your health care provider about what to expect.  Take over-the-counter and prescription medicines only as told by your health care provider. Do not take blood thinners or NSAIDs unless your health care provider approves. These include aspirin, ibuprofen, naproxen, and warfarin.  Keep your home environment safe to reduce the risk of falling.  Keep all follow-up visits as told by your health care provider. This is important. Where to find more information  [employer] of Neurological Disorders and Stroke: MasterBoxes.it  American Academy of Neurology (AAN): http://keith.biz/  Brain Injury Association of [place]: www.biausa.org Get help right away if you:  Are taking blood thinners and you fall or you experience minor trauma to the head. If you take any blood thinners, even a very small injury can cause a subdural hematoma.  Have a bleeding disorder and you fall or you experience minor trauma to the head.  Develop any of the following symptoms after a head injury: ? Clear fluid draining from your nose or ears. ? Nausea or vomiting. ? Changes in speech or trouble understanding what people say. ? Seizures. ? Drowsiness or a decrease in alertness. ? Double vision. ? Numbness or inability to move (paralysis) in any part of your body. ? Difficulty walking or poor coordination. ? Difficulty thinking. ? Confusion or forgetfulness. ? Personality changes. ? Irrational or aggressive behavior. These symptoms may represent a serious problem that is an emergency. Do not wait to see if the symptoms will go away. Get medical help  right away. Call your local emergency services (911 in the [place] Do not drive yourself to the hospital. Summary  A subdural hematoma is a collection of blood between the brain and its outer covering (dura).  Treatment for this condition depends on what type of subdural hematoma you have and how severe it is.  Symptoms can vary from mild to severe to life-threatening.  Monitor your symptoms, and ask others around you to do the same. This information is not intended to replace advice given to you by your health care provider. Make sure you discuss any questions you have with your health care provider. Document Revised: 02/03/2018 Document Reviewed: 02/03/2018 Elsevier Patient Education  2021 [place] DURING HEMODIALYSIS!

## 2020-07-19 NOTE — TOC Transition Note (Signed)
Transition of Care [facility]) - CM/SW Discharge Note   Patient Details  Name: Michael [name] MRN: [id number] Date of Birth: November 01, 1975  Transition of Care [facility]) CM/SW Contact:  Pollie Friar, RN Phone Number: 07/19/2020, 4:05 PM   Clinical Narrative:    Patient is discharging home with self care. No f/u per PT and no DME needs.  Pt's car is [facility] HD center. CM has provided cab voucher to provide him transport to his car.  Pt states his CPAP is not functioning properly. CM has spoken to [place] and they are going to have him switch it out for a new machine under his new insurance. They will contact him to change out the machines.  Pt has people to check in on him and has emergency call system at home.   Final next level of care: Home/Self Care Barriers to Discharge: No Barriers Identified   Patient Goals and CMS Choice        Discharge Placement                       Discharge Plan and Services                                     Social Determinants of Health (SDOH) Interventions     Readmission Risk Interventions No flowsheet data found.

## 2020-07-19 NOTE — Progress Notes (Signed)
Pt off the floor for CT

## 2020-07-27 ENCOUNTER — Telehealth: Payer: Self-pay

## 2020-07-27 NOTE — Telephone Encounter (Signed)
Pt is stating that he needs clearance for her dentist place   [facility]eth [facility] on [address]  If you can contact them

## 2020-08-01 DIAGNOSIS — R030 Elevated blood-pressure reading, without diagnosis of hypertension: Secondary | ICD-10-CM | POA: Insufficient documentation

## 2020-08-02 ENCOUNTER — Other Ambulatory Visit: Payer: Self-pay | Admitting: Neurosurgery

## 2020-08-02 ENCOUNTER — Telehealth (INDEPENDENT_AMBULATORY_CARE_PROVIDER_SITE_OTHER): Payer: 59 | Admitting: Family Medicine

## 2020-08-02 ENCOUNTER — Other Ambulatory Visit: Payer: Self-pay

## 2020-08-02 DIAGNOSIS — N186 End stage renal disease: Secondary | ICD-10-CM

## 2020-08-02 DIAGNOSIS — I10 Essential (primary) hypertension: Secondary | ICD-10-CM

## 2020-08-02 DIAGNOSIS — G43009 Migraine without aura, not intractable, without status migrainosus: Secondary | ICD-10-CM | POA: Diagnosis not present

## 2020-08-02 DIAGNOSIS — I120 Hypertensive chronic kidney disease with stage 5 chronic kidney disease or end stage renal disease: Secondary | ICD-10-CM | POA: Diagnosis not present

## 2020-08-02 DIAGNOSIS — S065XAA Traumatic subdural hemorrhage with loss of consciousness status unknown, initial encounter: Secondary | ICD-10-CM

## 2020-08-02 DIAGNOSIS — S065X9A Traumatic subdural hemorrhage with loss of consciousness of unspecified duration, initial encounter: Secondary | ICD-10-CM

## 2020-08-02 NOTE — Progress Notes (Signed)
Michael [name] are scheduled for a virtual visit with your provider today.    Just as we do with appointments in the office, we must obtain your consent to participate.  Your consent will be active for this visit and any virtual visit you may have with one of our providers in the next 365 days.    If you have a MyChart account, I can also send a copy of this consent to you electronically.  All virtual visits are billed to your insurance company just like a traditional visit in the office.  As this is a virtual visit, video technology does not allow for your provider to perform a traditional examination.  This may limit your provider's ability to fully assess your condition.  If your provider identifies any concerns that need to be evaluated in person or the need to arrange testing such as labs, EKG, etc, we will make arrangements to do so.    Although advances in technology are sophisticated, we cannot ensure that it will always work on either your end or our end.  If the connection with a video visit is poor, we may have to switch to a telephone visit.  With either a video or telephone visit, we are not always able to ensure that we have a secure connection.   I need to obtain your verbal consent now.   Are you willing to proceed with your visit today?   Michael [name] has provided verbal consent on 08/02/2020 for a virtual visit (video or telephone).   Virtual Visit via Video Note  I connected with Michael [name] on 08/02/20 at  1:45 PM EDT by a video enabled telemedicine application and verified that I am speaking with the correct person using two identifiers.  Location: Patient: Home Provider: [place]   I discussed the limitations of evaluation and management by telemedicine and the availability of in person appointments. The patient expressed understanding and agreed to proceed.  History of Present Illness:   Michael [name] is a 45 year old male with a medical history  significant for recent subdural hematoma, history of end-stage renal disease on HD, hypertension, hyperlipidemia, and history of polycystic kidney disease presents via video for a post hospital follow-up.  Michael [name] was admitted to inpatient services on 07/18/2020 after suffering severe headaches.  2 weeks prior to admission, patient was found to have a spontaneous acute subdural hematoma.  During that time, patient was observed overnight and imaging showed stabilized bleeding status and patient was discharged home in stable condition and was to establish care with neurologist.  Patient had partial relief with oxycodone, but ran out of medications prior to admission.  Patient had an ongoing persistent right-sided headache. Patient reports dull headache today.  He rates headache as 2-05/2008.  He says that headache is controlled with today, patient denies any double vision, blurred vision, weakness, numbness, or recent falls.  He says that headache is controlled with hydrocodone-acetaminophen.   Past Medical History:  Diagnosis Date  . Gout   . History of renal dialysis    M-W-F  . HLD (hyperlipidemia)   . Hypertension   . Pneumonia   . Polycystic kidney disease   . Sleep apnea 07/29/2018   uses cpap nightly  . Vitamin D deficiency 11/2018   Social History   Socioeconomic History  . Marital status: Single    Spouse name: Not on file  . Number of children: Not on file  . Years of education:  Not on file  . Highest education level: Not on file  Occupational History  . Occupation: [employer]  Tobacco Use  . Smoking status: Former Smoker    Packs/day: 0.20    Years: 10.00    Pack years: 2.00    Types: Cigarettes    Quit date: 2015    Years since quitting: 7.3  . Smokeless tobacco: Never Used  Vaping Use  . Vaping Use: Never used  Substance and Sexual Activity  . Alcohol use: Not Currently    Comment: occasional wine  . Drug use: No  . Sexual activity: Yes  Other Topics Concern  .  Not on file  Social History Narrative  . Not on file   Social Determinants of Health   Financial Resource Strain: Not on file  Food Insecurity: Not on file  Transportation Needs: Not on file  Physical Activity: Not on file  Stress: Not on file  Social Connections: Not on file  Intimate Partner Violence: Not on file   Immunization History  Administered Date(s) Administered  . Influenza Inj Mdck Quad With Preservative 01/28/2018  . Influenza Split 01/30/2017, 01/28/2018  . Influenza,inj,Quad PF,6+ Mos 01/30/2017  . Moderna Sars-Covid-2 Vaccination 08/18/2019, 09/16/2019    Assessment and Plan:  1. End stage renal disease due to hypertension ([place]) Continue hemodialysis as scheduled  2. Essential hypertension, benign Patient does not check blood pressure at home.  He will follow-up in person in 3 months.  3. Migraine without aura and without status migrainosus, not intractable Patient is awaiting appointment to establish care with neurologist no medication changes at this time.  Discussed opiate medication use at length, patient expressed understanding.  Follow Up Instructions:    I discussed the assessment and treatment plan with the patient. The patient was provided an opportunity to ask questions and all were answered. The patient agreed with the plan and demonstrated an understanding of the instructions.   The patient was advised to call back or seek an in-person evaluation if the symptoms worsen or if the condition fails to improve as anticipated.  I provided 10 minutes of non-face-to-face time during this encounter.  [name]  APRN, MSN, FNP-C [facility] [address] [place], [place] [address] [phone]

## 2020-08-05 ENCOUNTER — Ambulatory Visit
Admission: RE | Admit: 2020-08-05 | Discharge: 2020-08-05 | Disposition: A | Discharge status: Home / Self Care | Payer: 59 | Source: Ambulatory Visit | Attending: Neurosurgery | Admitting: Neurosurgery

## 2020-08-05 DIAGNOSIS — S065XAA Traumatic subdural hemorrhage with loss of consciousness status unknown, initial encounter: Secondary | ICD-10-CM

## 2020-08-05 DIAGNOSIS — S065X9A Traumatic subdural hemorrhage with loss of consciousness of unspecified duration, initial encounter: Secondary | ICD-10-CM

## 2020-08-19 ENCOUNTER — Other Ambulatory Visit: Payer: Self-pay | Admitting: Neurosurgery

## 2020-08-19 DIAGNOSIS — S065XAA Traumatic subdural hemorrhage with loss of consciousness status unknown, initial encounter: Secondary | ICD-10-CM

## 2020-08-19 DIAGNOSIS — S065X9A Traumatic subdural hemorrhage with loss of consciousness of unspecified duration, initial encounter: Secondary | ICD-10-CM

## 2020-08-30 ENCOUNTER — Other Ambulatory Visit: Payer: Self-pay

## 2020-08-30 ENCOUNTER — Ambulatory Visit (INDEPENDENT_AMBULATORY_CARE_PROVIDER_SITE_OTHER): Payer: 59 | Admitting: Nurse Practitioner

## 2020-08-30 VITALS — BP 143/97 | HR 123 | Temp 97.8°F | Resp 18 | Ht 74.0 in | Wt 265.0 lb

## 2020-08-30 DIAGNOSIS — Z0000 Encounter for general adult medical examination without abnormal findings: Secondary | ICD-10-CM | POA: Diagnosis not present

## 2020-08-30 NOTE — Progress Notes (Signed)
Subjective:   Michael [name] is a 45 y.o. male who presents for Medicare Annual/Subsequent preventive examination.  Review of Systems    Review of Systems  Constitutional: Negative.   HENT: Negative.    Eyes: Negative.   Respiratory: Negative.    Cardiovascular: Negative.   Gastrointestinal: Negative.   Genitourinary: Negative.   Skin: Negative.   Neurological: Negative.   Endo/Heme/Allergies: Negative.   Psychiatric/Behavioral: Negative.           Objective:    Today's Vitals   08/30/20 1314 08/30/20 1320  BP: (!) 143/97   Pulse: (!) 123   Resp: 18   Temp: 97.8 F (36.6 C)   SpO2: 98%   Weight: 265 lb (120.2 kg)   Height: 6\' 2"  (1.88 m)   PainSc:  7    Body mass index is 34.02 kg/m.  Advanced Directives 07/18/2020 07/04/2020 06/03/2020 03/24/2020 02/16/2020 12/11/2019 12/07/2019  Does Patient Have a Medical Advance Directive? No No No No No No No  Would patient like information on creating a medical advance directive? No - Guardian declined No - Patient declined - No - Patient declined - No - Patient declined No - Patient declined    Current Medications (verified) Outpatient Encounter Medications as of 08/30/2020  Medication Sig   acetaminophen (TYLENOL) 500 MG tablet Take 1,000 mg by mouth every 6 (six) hours as needed for mild pain. (Patient not taking: Reported on 08/02/2020)   allopurinol (ZYLOPRIM) 100 MG tablet Take 100 mg by mouth 2 (two) times daily as needed (Gout). (Patient not taking: Reported on 08/02/2020)   Ascorbic Acid (VITAMIN C GUMMIE PO) Take 1 tablet by mouth every morning.   calcium acetate (PHOSLO) 667 MG capsule Take 2,001 mg by mouth 3 (three) times daily with meals. (Patient not taking: Reported on 08/02/2020)   HYDROcodone-acetaminophen (NORCO/VICODIN) 5-325 MG tablet Take 1 tablet by mouth every 4 (four) hours as needed for moderate pain.   levETIRAcetam (KEPPRA) 500 MG tablet Take 1 tablet (500 mg total) by mouth at bedtime for 7 days.    lidocaine-prilocaine (EMLA) cream Apply 1 application topically See admin instructions. Apply topically to port access one hour prior to dialysis - Monday, Wednesday, Friday   Multiple Vitamin (MULTIVITAMIN WITH MINERALS) TABS tablet Take 1 tablet by mouth every morning.   PRESCRIPTION MEDICATION Inhale into the lungs at bedtime. CPAP   senna-docusate (SENOKOT-S) 8.6-50 MG tablet Take 1 tablet by mouth at bedtime as needed for mild constipation.   [DISCONTINUED] isosorbide dinitrate (ISORDIL) 10 MG tablet Take 1 tablet (10 mg total) by mouth 2 (two) times daily. (Patient not taking: No sig reported)   No facility-administered encounter medications on file as of 08/30/2020.    Allergies (verified) Patient has no known allergies.   History: Past Medical History:  Diagnosis Date   Gout    History of renal dialysis    M-W-F   HLD (hyperlipidemia)    Hypertension    Pneumonia    Polycystic kidney disease    Sleep apnea 07/29/2018   uses cpap nightly   Vitamin D deficiency 11/2018   Past Surgical History:  Procedure Laterality Date   AV FISTULA PLACEMENT Left 03/24/2020   Procedure: INSERTION OF LEFT UPPER EXTREMITY ARTERIOVENOUS (AV) GORE-TEX GRAFT;  Surgeon: [name], MD;  Location: [facility];  Service: Vascular;  Laterality: Left;  PERIPHERAL NERVE BLOCK   [place] Left 12/11/2019   Procedure: 1ST STAGE BASILIC TRANSPOSITION OF LEFT ARM;  Surgeon: [name],  Judeth Cornfield, MD;  Location: [facility];  Service: Vascular;  Laterality: Left;   IR FLUORO GUIDE CV LINE RIGHT  12/09/2019   IR US GUIDE VASC ACCESS RIGHT  12/09/2019   LEFT HEART CATH AND CORONARY ANGIOGRAPHY N/A 12/09/2019   Procedure: LEFT HEART CATH AND CORONARY ANGIOGRAPHY;  Surgeon: Adrian Prows, MD;  Location: [facility];  Service: Cardiovascular;  Laterality: N/A;   Family History  Problem Relation Age of Onset   Bipolar disorder Father    Polycystic kidney disease Neg Hx    Social History    Socioeconomic History   Marital status: Single    Spouse name: Not on file   Number of children: Not on file   Years of education: Not on file   Highest education level: Not on file  Occupational History   Occupation: [employer]  Tobacco Use   Smoking status: Former    Packs/day: 0.20    Years: 10.00    Pack years: 2.00    Types: Cigarettes    Quit date: 2015    Years since quitting: 7.4   Smokeless tobacco: Never  Vaping Use   Vaping Use: Never used  Substance and Sexual Activity   Alcohol use: Not Currently    Comment: occasional wine   Drug use: No   Sexual activity: Yes  Other Topics Concern   Not on file  Social History Narrative   Not on file   Social Determinants of Health   Financial Resource Strain: Not on file  Food Insecurity: Not on file  Transportation Needs: Not on file  Physical Activity: Not on file  Stress: Not on file  Social Connections: Not on file    Tobacco Counseling Counseling given: Yes   Clinical Intake:  Pre-visit preparation completed: No  Pain : 0-10 Pain Score: 7  Pain Type: Acute pain Pain Location: Head Pain Onset: 1 to 4 weeks ago Pain Frequency: Several days a week     BMI - recorded: 34.02 Nutritional Status: BMI > 30  Obese Nutritional Risks: None Diabetes: No  What is the last grade level you completed in school?: 12th  Diabetic? no  Interpreter Needed?: No      Activities of Daily Living In your present state of health, do you have any difficulty performing the following activities: 07/18/2020 07/04/2020  Hearing? N N  Vision? N N  Difficulty concentrating or making decisions? N N  Walking or climbing stairs? N N  Comment - -  Dressing or bathing? N N  Doing errands, shopping? N N  Some recent data might be hidden    Patient Care Team: [name], FNP as PCP - [facility] ([facility], [place] any recent Medical Services you may have received from other  than [facility] providers in the past year (date may be approximate).     Assessment:   This is a routine wellness examination for Michael [name].  Hearing/Vision screen No results found.  Dietary issues and exercise activities discussed:     Goals Addressed             This Visit's Progress    DIET - REDUCE FAT INTAKE         Depression Screen PHQ 2/9 Scores 02/16/2020 11/24/2019 10/19/2019 06/22/2019 05/26/2019 02/24/2019 12/12/2018  PHQ - 2 Score 1 2 0 0 1 0 2  PHQ- 9 Score - 6 - - - - 13    Fall Risk Fall Risk  02/16/2020 10/19/2019  06/22/2019 05/26/2019 02/24/2019  Falls in the past year? 1 0 0 0 0  Number falls in past yr: 1 - - - 0  Injury with Fall? 1 - - - 0    FALL RISK PREVENTION PERTAINING TO THE HOME:  Any stairs in or around the home? Yes  If so, are there any without handrails? Yes  Home free of loose throw rugs in walkways, pet beds, electrical cords, etc? Yes  Adequate lighting in your home to reduce risk of falls? Yes   ASSISTIVE DEVICES UTILIZED TO PREVENT FALLS:  Life alert? No  Use of a cane, walker or w/c? No  Grab bars in the bathroom? No  Shower chair or bench in shower? No  Elevated toilet seat or a handicapped toilet? No   TIMED UP AND GO:  Was the test performed? Yes .  Length of time to ambulate 10 feet: 3 sec.   Gait steady and fast without use of assistive device  Cognitive Function:        Immunizations Immunization History  Administered Date(s) Administered   Influenza Inj Mdck Quad With Preservative 01/28/2018   Influenza Split 01/30/2017, 01/28/2018   Influenza,inj,Quad PF,6+ Mos 01/30/2017   Moderna Sars-Covid-2 Vaccination 08/18/2019, 09/16/2019    TDAP status: Up to date  Flu Vaccine status: Up to date  Pneumococcal vaccine status: Up to date  Covid-19 vaccine status: Completed vaccines  Qualifies for Shingles Vaccine? No   Zostavax completed No   Shingrix Completed?: No.    Education has been provided regarding the importance of  this vaccine. Patient has been advised to call insurance company to determine out of pocket expense if they have not yet received this vaccine. Advised may also receive vaccine at local pharmacy or Health Dept. Verbalized acceptance and understanding.  Screening Tests Health Maintenance  Topic Date Due   Pneumococcal Vaccine 48-62 Years old (1 - PCV) Never done   Zoster Vaccines- Shingrix (1 of 2) Never done   COVID-19 Vaccine (3 - Booster for Moderna series) 02/16/2020   TETANUS/TDAP  08/22/2024   Hepatitis C Screening  Completed   HIV Screening  Completed   HPV VACCINES  Aged Out    Health Maintenance  Health Maintenance Due  Topic Date Due   Pneumococcal Vaccine 7-66 Years old (1 - PCV) Never done   Zoster Vaccines- Shingrix (1 of 2) Never done   COVID-19 Vaccine (3 - Booster for Moderna series) 02/16/2020    Colorectal cancer screening - due - next year  Lung Cancer Screening: (Low Dose CT Chest recommended if Age 69-80 years, 30 pack-year currently smoking OR have quit w/in 15years.) does not qualify.   Lung Cancer Screening Referral: NA  Additional Screening:  Hepatitis C Screening: does qualify; Completed   Vision Screening: Recommended annual ophthalmology exams for early detection of glaucoma and other disorders of the eye. Is the patient up to date with their annual eye exam?  Yes  Who is the provider or what is the name of the office in which the patient attends annual eye exams? [facility] If pt is not established with a provider, would they like to be referred to a provider to establish care?  na .   Dental Screening: Recommended annual dental exams for proper oral hygiene  Community Resource Referral / Chronic Care Management: CRR required this visit?  No   CCM required this visit?  No      Plan:     I have personally reviewed  and noted the following in the patient's chart:   Medical and social history Use of alcohol, tobacco or illicit drugs   Current medications and supplements including opioid prescriptions. Patient is currently taking opioid prescriptions. Information provided to patient regarding non-opioid alternatives. Patient advised to discuss non-opioid treatment plan with their provider. Functional ability and status Nutritional status Physical activity Advanced directives List of other physicians Hospitalizations, surgeries, and ER visits in previous 12 months Vitals Screenings to include cognitive, depression, and falls Referrals and appointments  In addition, I have reviewed and discussed with patient certain preventive protocols, quality metrics, and best practice recommendations. A written personalized care plan for preventive services as well as general preventive health recommendations were provided to patient.     Fenton Foy, NP   08/30/2020

## 2020-08-30 NOTE — Patient Instructions (Addendum)
Michael [name] , Thank you for taking time to come for your Medicare Wellness Visit. I appreciate your ongoing commitment to your health goals. Please review the following plan we discussed and let me know if I can assist you in the future.   These are the goals we discussed:  Goals     . DIET - REDUCE FAT INTAKE        This is a list of the screening recommended for you and due dates:  Health Maintenance  Topic Date Due  . Pneumococcal Vaccination (1 - PCV) Never done  . Zoster (Shingles) Vaccine (1 of 2) Never done  . COVID-19 Vaccine (3 - Booster for Moderna series) 02/16/2020  . Tetanus Vaccine  08/22/2024  . Hepatitis C Screening: USPSTF Recommendation to screen - Ages 36-79 yo.  Completed  . HIV Screening  Completed  . HPV Vaccine  Aged Out    Fat and Cholesterol Restricted Eating Plan Eating a diet that limits fat and cholesterol may help lower your risk for heart disease and other conditions. Your body needs fat and cholesterol for basic functions, but eating too much of these things can be harmful to yourhealth. Your health care provider may order lab tests to check your blood fat (lipid) and cholesterol levels. This helps your health care provider understand your risk for certain conditions and whether you need to make diet changes. Work with your health care provider or dietitian to make an eating plan that isright for you. Your plan includes: Limit your fat intake to ______% or less of your total calories a day. Limit your saturated fat intake to ______% or less of your total calories a day. Limit the amount of cholesterol in your diet to less than _________mg a day. Eat ___________ g of fiber a day. What are tips for following this plan? General guidelines  If you are overweight, work with your health care provider to lose weight safely. Losing just 5-10% of your body weight can improve your overall health and help prevent diseases such as diabetes and heart  disease. Avoid: Foods with added sugar. Fried foods. Foods that contain partially hydrogenated oils, including stick margarine, some tub margarines, cookies, crackers, and other baked goods. Limit alcohol intake to no more than 1 drink a day for nonpregnant women and 2 drinks a day for men. One drink equals 12 oz of beer, 5 oz of wine, or 1 oz of hard liquor.  Reading food labels Check food labels for: Trans fats, partially hydrogenated oils, or high amounts of saturated fat. Avoid foods that contain saturated fat and trans fat. The amount of cholesterol in each serving. Try to eat no more than 200 mg of cholesterol each day. The amount of fiber in each serving. Try to eat at least 20-30 g of fiber each day. Choose foods with healthy fats, such as: Monounsaturated and polyunsaturated fats. These include olive and canola oil, flaxseeds, walnuts, almonds, and seeds. Omega-3 fats. These are found in foods such as salmon, mackerel, sardines, tuna, flaxseed oil, and ground flaxseeds. Choose grain products that have whole grains. Look for the word "whole" as the first word in the ingredient list. Cooking Cook foods using methods other than frying. Baking, boiling, grilling, and broiling are some healthy options. Eat more home-cooked food and less restaurant, buffet, and fast food. Avoid cooking using saturated fats. Animal sources of saturated fats include meats, butter, and cream. Plant sources of saturated fats include palm oil, palm kernel oil, and  coconut oil. Meal planning  At meals, imagine dividing your plate into fourths: Fill one-half of your plate with vegetables and green salads. Fill one-fourth of your plate with whole grains. Fill one-fourth of your plate with lean protein foods. Eat fish that is high in omega-3 fats at least two times a week. Eat more foods that contain fiber, such as whole grains, beans, apples, broccoli, carrots, peas, and barley. These foods help promote  healthy cholesterol levels in the blood.  Recommended foods Grains Whole grains, such as whole wheat or whole grain breads, crackers, cereals, and pasta. Unsweetened oatmeal, bulgur, barley, quinoa, or brown rice. Corn or whole wheat flour tortillas. Vegetables Fresh or frozen vegetables (raw, steamed, roasted, or grilled). Green salads. Fruits All fresh, canned (in natural juice), or frozen fruits. Meats and other protein foods Ground beef (85% or leaner), grass-fed beef, or beef trimmed of fat. Skinless chicken or [place]. Ground chicken or [place]. Pork trimmed of fat. All fish and seafood. Egg whites. Dried beans, peas, or lentils. Unsalted nuts or seeds. Unsalted canned beans. Natural nut butters without added sugar and oil. Dairy Low-fat or nonfat dairy products, such as skim or 1% milk, 2% or reduced-fat cheeses, low-fat and fat-free ricotta or cottage cheese, or plain low-fat and nonfat yogurt. Fats and oils Tub margarine without trans fats. Light or reduced-fat mayonnaise and salad dressings. Avocado. Olive, canola, sesame, or safflower oils. The items listed above may not be a complete list of foods and beverages you can eat. Contact a dietitian for more information. Foods to avoid Grains White bread. White pasta. White rice. Cornbread. Bagels, pastries, and croissants. Crackers and snack foods that contain trans fat and hydrogenated oils. Vegetables Vegetables cooked in cheese, cream, or butter sauce. Fried vegetables. Fruits Canned fruit in heavy syrup. Fruit in cream or butter sauce. Fried fruit. Meats and other protein foods Fatty cuts of meat. Ribs, chicken wings, bacon, sausage, bologna, salami, chitterlings, fatback, hot dogs, bratwurst, and packaged lunch meats. Liver and organ meats. Whole eggs and egg yolks. Chicken and [place] with skin. Fried meat. Dairy Whole or 2% milk, cream, half-and-half, and cream cheese. Whole milk cheeses. Whole-fat or sweetened yogurt. Full-fat  cheeses. Nondairy creamers and whipped toppings. Processed cheese, cheese spreads, and cheese curds. Beverages Alcohol. Sugar-sweetened drinks such as sodas, lemonade, and fruit drinks. Fats and oils Butter, stick margarine, lard, shortening, ghee, or bacon fat. Coconut, palm kernel, and palm oils. Sweets and desserts Corn syrup, sugars, honey, and molasses. Candy. Jam and jelly. Syrup. Sweetened cereals. Cookies, pies, cakes, donuts, muffins, and ice cream. The items listed above may not be a complete list of foods and beverages you should avoid. Contact a dietitian for more information. Summary Your body needs fat and cholesterol for basic functions. However, eating too much of these things can be harmful to your health. Work with your health care provider and dietitian to follow a diet low in fat and cholesterol. Doing this may help lower your risk for heart disease and other conditions. Choose healthy fats, such as monounsaturated and polyunsaturated fats, and foods high in omega-3 fatty acids. Eat fiber-rich foods, such as whole grains, beans, peas, fruits, and vegetables. Limit or avoid alcohol, fried foods, and foods high in saturated fats, partially hydrogenated oils, and sugar. This information is not intended to replace advice given to you by your health care provider. Make sure you discuss any questions you have with your healthcare provider. Document Revised: 11/04/2019 Document Reviewed: 07/08/2019 Elsevier Patient Education  [place] Maintenance, Male Adopting a healthy lifestyle and getting preventive care are important in promoting health and wellness. Ask your health care provider about: The right schedule for you to have regular tests and exams. Things you can do on your own to prevent diseases and keep yourself healthy. What should I know about diet, weight, and exercise? Eat a healthy diet  Eat a diet that includes plenty of vegetables, fruits, low-fat  dairy products, and lean protein. Do not eat a lot of foods that are high in solid fats, added sugars, or sodium.  Maintain a healthy weight Body mass index (BMI) is a measurement that can be used to identify possible weight problems. It estimates body fat based on height and weight. Your health care provider can help determine your BMI and help you achieve or maintain ahealthy weight. Get regular exercise Get regular exercise. This is one of the most important things you can do for your health. Most adults should: Exercise for at least 150 minutes each week. The exercise should increase your heart rate and make you sweat (moderate-intensity exercise). Do strengthening exercises at least twice a week. This is in addition to the moderate-intensity exercise. Spend less time sitting. Even light physical activity can be beneficial. Watch cholesterol and blood lipids Have your blood tested for lipids and cholesterol at 45 years of age, then havethis test every 5 years. You may need to have your cholesterol levels checked more often if: Your lipid or cholesterol levels are high. You are older than 45 years of age. You are at high risk for heart disease. What should I know about cancer screening? Many types of cancers can be detected early and may often be prevented. Depending on your health history and family history, you may need to have cancer screening at various ages. This may include screening for: Colorectal cancer. Prostate cancer. Skin cancer. Lung cancer. What should I know about heart disease, diabetes, and high blood pressure? Blood pressure and heart disease High blood pressure causes heart disease and increases the risk of stroke. This is more likely to develop in people who have high blood pressure readings, are of African descent, or are overweight. Talk with your health care provider about your target blood pressure readings. Have your blood pressure checked: Every 3-5 years if  you are 69-67 years of age. Every year if you are 76 years old or older. If you are between the ages of 51 and 3 and are a current or former smoker, ask your health care provider if you should have a one-time screening for abdominal aortic aneurysm (AAA). Diabetes Have regular diabetes screenings. This checks your fasting blood sugar level. Have the screening done: Once every three years after age 67 if you are at a normal weight and have a low risk for diabetes. More often and at a younger age if you are overweight or have a high risk for diabetes. What should I know about preventing infection? Hepatitis B If you have a higher risk for hepatitis B, you should be screened for this virus. Talk with your health care provider to find out if you are at risk forhepatitis B infection. Hepatitis C Blood testing is recommended for: Everyone born from [birth year] through 1965. Anyone with known risk factors for hepatitis C. Sexually transmitted infections (STIs) You should be screened each year for STIs, including gonorrhea and chlamydia, if: You are sexually active and are younger than 45 years of age. You are older  than 45 years of age and your health care provider tells you that you are at risk for this type of infection. Your sexual activity has changed since you were last screened, and you are at increased risk for chlamydia or gonorrhea. Ask your health care provider if you are at risk. Ask your health care provider about whether you are at high risk for HIV. Your health care provider may recommend a prescription medicine to help prevent HIV infection. If you choose to take medicine to prevent HIV, you should first get tested for HIV. You should then be tested every 3 months for as long as you are taking the medicine. Follow these instructions at home: Lifestyle Do not use any products that contain nicotine or tobacco, such as cigarettes, e-cigarettes, and chewing tobacco. If you need help quitting, ask  your health care provider. Do not use street drugs. Do not share needles. Ask your health care provider for help if you need support or information about quitting drugs. Alcohol use Do not drink alcohol if your health care provider tells you not to drink. If you drink alcohol: Limit how much you have to 0-2 drinks a day. Be aware of how much alcohol is in your drink. In the [place] one drink equals one 12 oz bottle of beer (355 mL), one 5 oz glass of wine (148 mL), or one 1 oz glass of hard liquor (44 mL). General instructions Schedule regular health, dental, and eye exams. Stay current with your vaccines. Tell your health care provider if: You often feel depressed. You have ever been abused or do not feel safe at home. Summary Adopting a healthy lifestyle and getting preventive care are important in promoting health and wellness. Follow your health care provider's instructions about healthy diet, exercising, and getting tested or screened for diseases. Follow your health care provider's instructions on monitoring your cholesterol and blood pressure. This information is not intended to replace advice given to you by your health care provider. Make sure you discuss any questions you have with your healthcare provider. Document Revised: 02/26/2018 Document Reviewed: 02/26/2018 Elsevier Patient Education  2022 [place].  Steps to Quit Smoking Smoking tobacco is the leading cause of preventable death. It can affect almost every organ in the body. Smoking puts you and people around you at risk for many serious, long-lasting (chronic) diseases. Quitting smoking can be hard, but it is one of the best things thatyou can do for your health. It is never too late to quit. How do I get ready to quit? When you decide to quit smoking, make a plan to help you succeed. Before you quit: Pick a date to quit. Set a date within the next 2 weeks to give you time to prepare. Write down the reasons why you  are quitting. Keep this list in places where you will see it often. Tell your family, friends, and co-workers that you are quitting. Their support is important. Talk with your doctor about the choices that may help you quit. Find out if your health insurance will pay for these treatments. Know the people, places, things, and activities that make you want to smoke (triggers). Avoid them. What first steps can I take to quit smoking? Throw away all cigarettes at home, at work, and in your car. Throw away the things that you use when you smoke, such as ashtrays and lighters. Clean your car. Make sure to empty the ashtray. Clean your home, including curtains and carpets. What can  I do to help me quit smoking? Talk with your doctor about taking medicines and seeing a counselor at the same time. You are more likely to succeed when you do both. If you are pregnant or breastfeeding, talk with your doctor about counseling or other ways to quit smoking. Do not take medicine to help you quit smoking unless your doctor tells you to do so. To quit smoking: Quit right away Quit smoking totally, instead of slowly cutting back on how much you smoke over a period of time. Go to counseling. You are more likely to quit if you go to counseling sessions regularly. Take medicine You may take medicines to help you quit. Some medicines need a prescription, and some you can buy over-the-counter. Some medicines may contain a drug called nicotine to replace the nicotine in cigarettes. Medicines may: Help you to stop having the desire to smoke (cravings). Help to stop the problems that come when you stop smoking (withdrawal symptoms). Your doctor may ask you to use: Nicotine patches, gum, or lozenges. Nicotine inhalers or sprays. Non-nicotine medicine that is taken by mouth. Find resources Find resources and other ways to help you quit smoking and remain smoke-free after you quit. These resources are most helpful when  you use them often. They include: Online chats with a Social worker. Phone quitlines. Printed Furniture conservator/restorer. Support groups or group counseling. Text messaging programs. Mobile phone apps. Use apps on your mobile phone or tablet that can help you stick to your quit plan. There are many free apps for mobile phones and tablets as well as websites. Examples include Quit Guide from the [employer] and smokefree.gov  What things can I do to make it easier to quit?  Talk to your family and friends. Ask them to support and encourage you. Call a phone quitline (1-800-QUIT-NOW), reach out to support groups, or work with a Social worker. Ask people who smoke to not smoke around you. Avoid places that make you want to smoke, such as: Bars. Parties. Smoke-break areas at work. Spend time with people who do not smoke. Lower the stress in your life. Stress can make you want to smoke. Try these things to help your stress: Getting regular exercise. Doing deep-breathing exercises. Doing yoga. Meditating. Doing a body scan. To do this, close your eyes, focus on one area of your body at a time from head to toe. Notice which parts of your body are tense. Try to relax the muscles in those areas. How will I feel when I quit smoking? Day 1 to 3 weeks Within the first 24 hours, you may start to have some problems that come from quitting tobacco. These problems are very bad 2-3 days after you quit, but they do not often last for more than 2-3 weeks. You may get these symptoms: Mood swings. Feeling restless, nervous, angry, or annoyed. Trouble concentrating. Dizziness. Strong desire for high-sugar foods and nicotine. Weight gain. Trouble pooping (constipation). Feeling like you may vomit (nausea). Coughing or a sore throat. Changes in how the medicines that you take for other issues work in your body. Depression. Trouble sleeping (insomnia). Week 3 and afterward After the first 2-3 weeks of quitting, you may  start to notice more positive results, such as: Better sense of smell and taste. Less coughing and sore throat. Slower heart rate. Lower blood pressure. Clearer skin. Better breathing. Fewer sick days. Quitting smoking can be hard. Do not give up if you fail the first time. Some people need to try  a few times before they succeed. Do your best to stick to your quit plan, and talk with yourdoctor if you have any questions or concerns. Summary Smoking tobacco is the leading cause of preventable death. Quitting smoking can be hard, but it is one of the best things that you can do for your health. When you decide to quit smoking, make a plan to help you succeed. Quit smoking right away, not slowly over a period of time. When you start quitting, seek help from your doctor, family, or friends. This information is not intended to replace advice given to you by your health care provider. Make sure you discuss any questions you have with your healthcare provider. Document Revised: 11/28/2018 Document Reviewed: 05/24/2018 Elsevier Patient Education  [place].

## 2020-09-06 ENCOUNTER — Other Ambulatory Visit: Payer: 59

## 2020-09-08 DIAGNOSIS — I671 Cerebral aneurysm, nonruptured: Secondary | ICD-10-CM | POA: Insufficient documentation

## 2020-09-16 DIAGNOSIS — Z992 Dependence on renal dialysis: Secondary | ICD-10-CM | POA: Diagnosis not present

## 2020-09-16 DIAGNOSIS — N2581 Secondary hyperparathyroidism of renal origin: Secondary | ICD-10-CM | POA: Diagnosis not present

## 2020-09-16 DIAGNOSIS — D509 Iron deficiency anemia, unspecified: Secondary | ICD-10-CM | POA: Diagnosis not present

## 2020-09-16 DIAGNOSIS — N186 End stage renal disease: Secondary | ICD-10-CM | POA: Diagnosis not present

## 2020-09-16 DIAGNOSIS — D631 Anemia in chronic kidney disease: Secondary | ICD-10-CM | POA: Diagnosis not present

## 2020-09-20 DIAGNOSIS — D509 Iron deficiency anemia, unspecified: Secondary | ICD-10-CM | POA: Diagnosis not present

## 2020-09-20 DIAGNOSIS — I871 Compression of vein: Secondary | ICD-10-CM | POA: Diagnosis not present

## 2020-09-20 DIAGNOSIS — N186 End stage renal disease: Secondary | ICD-10-CM | POA: Diagnosis not present

## 2020-09-20 DIAGNOSIS — T82868A Thrombosis of vascular prosthetic devices, implants and grafts, initial encounter: Secondary | ICD-10-CM | POA: Diagnosis not present

## 2020-09-20 DIAGNOSIS — N2581 Secondary hyperparathyroidism of renal origin: Secondary | ICD-10-CM | POA: Diagnosis not present

## 2020-09-20 DIAGNOSIS — D631 Anemia in chronic kidney disease: Secondary | ICD-10-CM | POA: Diagnosis not present

## 2020-09-20 DIAGNOSIS — Z992 Dependence on renal dialysis: Secondary | ICD-10-CM | POA: Diagnosis not present

## 2020-09-21 DIAGNOSIS — D631 Anemia in chronic kidney disease: Secondary | ICD-10-CM | POA: Diagnosis not present

## 2020-09-21 DIAGNOSIS — N2581 Secondary hyperparathyroidism of renal origin: Secondary | ICD-10-CM | POA: Diagnosis not present

## 2020-09-21 DIAGNOSIS — D509 Iron deficiency anemia, unspecified: Secondary | ICD-10-CM | POA: Diagnosis not present

## 2020-09-21 DIAGNOSIS — Z992 Dependence on renal dialysis: Secondary | ICD-10-CM | POA: Diagnosis not present

## 2020-09-21 DIAGNOSIS — N186 End stage renal disease: Secondary | ICD-10-CM | POA: Diagnosis not present

## 2020-09-22 DIAGNOSIS — N186 End stage renal disease: Secondary | ICD-10-CM | POA: Diagnosis not present

## 2020-09-22 DIAGNOSIS — Z79899 Other long term (current) drug therapy: Secondary | ICD-10-CM | POA: Diagnosis not present

## 2020-09-22 DIAGNOSIS — Z7902 Long term (current) use of antithrombotics/antiplatelets: Secondary | ICD-10-CM | POA: Diagnosis not present

## 2020-09-22 DIAGNOSIS — Z7982 Long term (current) use of aspirin: Secondary | ICD-10-CM | POA: Diagnosis not present

## 2020-09-22 DIAGNOSIS — Z992 Dependence on renal dialysis: Secondary | ICD-10-CM | POA: Diagnosis not present

## 2020-09-22 DIAGNOSIS — G4733 Obstructive sleep apnea (adult) (pediatric): Secondary | ICD-10-CM | POA: Diagnosis not present

## 2020-09-22 DIAGNOSIS — I6200 Nontraumatic subdural hemorrhage, unspecified: Secondary | ICD-10-CM | POA: Diagnosis not present

## 2020-09-22 DIAGNOSIS — I6203 Nontraumatic chronic subdural hemorrhage: Secondary | ICD-10-CM | POA: Diagnosis not present

## 2020-09-22 DIAGNOSIS — Z9889 Other specified postprocedural states: Secondary | ICD-10-CM | POA: Diagnosis not present

## 2020-09-22 DIAGNOSIS — I671 Cerebral aneurysm, nonruptured: Secondary | ICD-10-CM | POA: Diagnosis not present

## 2020-09-22 DIAGNOSIS — D631 Anemia in chronic kidney disease: Secondary | ICD-10-CM | POA: Diagnosis not present

## 2020-09-22 DIAGNOSIS — I120 Hypertensive chronic kidney disease with stage 5 chronic kidney disease or end stage renal disease: Secondary | ICD-10-CM | POA: Diagnosis not present

## 2020-09-22 DIAGNOSIS — I10 Essential (primary) hypertension: Secondary | ICD-10-CM | POA: Diagnosis not present

## 2020-09-22 DIAGNOSIS — M109 Gout, unspecified: Secondary | ICD-10-CM | POA: Diagnosis not present

## 2020-09-22 DIAGNOSIS — Z87891 Personal history of nicotine dependence: Secondary | ICD-10-CM | POA: Diagnosis not present

## 2020-09-22 DIAGNOSIS — R296 Repeated falls: Secondary | ICD-10-CM | POA: Diagnosis not present

## 2020-09-23 ENCOUNTER — Other Ambulatory Visit: Payer: Self-pay

## 2020-09-26 DIAGNOSIS — N186 End stage renal disease: Secondary | ICD-10-CM | POA: Diagnosis not present

## 2020-09-26 DIAGNOSIS — D631 Anemia in chronic kidney disease: Secondary | ICD-10-CM | POA: Diagnosis not present

## 2020-09-26 DIAGNOSIS — Z992 Dependence on renal dialysis: Secondary | ICD-10-CM | POA: Diagnosis not present

## 2020-09-26 DIAGNOSIS — N2581 Secondary hyperparathyroidism of renal origin: Secondary | ICD-10-CM | POA: Diagnosis not present

## 2020-09-26 DIAGNOSIS — D509 Iron deficiency anemia, unspecified: Secondary | ICD-10-CM | POA: Diagnosis not present

## 2020-09-27 ENCOUNTER — Other Ambulatory Visit: Payer: 59

## 2020-09-27 DIAGNOSIS — G4733 Obstructive sleep apnea (adult) (pediatric): Secondary | ICD-10-CM | POA: Diagnosis not present

## 2020-09-28 DIAGNOSIS — Z992 Dependence on renal dialysis: Secondary | ICD-10-CM | POA: Diagnosis not present

## 2020-09-28 DIAGNOSIS — D509 Iron deficiency anemia, unspecified: Secondary | ICD-10-CM | POA: Diagnosis not present

## 2020-09-28 DIAGNOSIS — D631 Anemia in chronic kidney disease: Secondary | ICD-10-CM | POA: Diagnosis not present

## 2020-09-28 DIAGNOSIS — N186 End stage renal disease: Secondary | ICD-10-CM | POA: Diagnosis not present

## 2020-09-28 DIAGNOSIS — N2581 Secondary hyperparathyroidism of renal origin: Secondary | ICD-10-CM | POA: Diagnosis not present

## 2020-09-29 ENCOUNTER — Ambulatory Visit: Payer: Medicare Other

## 2020-09-30 DIAGNOSIS — D631 Anemia in chronic kidney disease: Secondary | ICD-10-CM | POA: Diagnosis not present

## 2020-09-30 DIAGNOSIS — Z992 Dependence on renal dialysis: Secondary | ICD-10-CM | POA: Diagnosis not present

## 2020-09-30 DIAGNOSIS — N2581 Secondary hyperparathyroidism of renal origin: Secondary | ICD-10-CM | POA: Diagnosis not present

## 2020-09-30 DIAGNOSIS — D509 Iron deficiency anemia, unspecified: Secondary | ICD-10-CM | POA: Diagnosis not present

## 2020-09-30 DIAGNOSIS — N186 End stage renal disease: Secondary | ICD-10-CM | POA: Diagnosis not present

## 2020-10-04 DIAGNOSIS — D631 Anemia in chronic kidney disease: Secondary | ICD-10-CM | POA: Diagnosis not present

## 2020-10-04 DIAGNOSIS — Z992 Dependence on renal dialysis: Secondary | ICD-10-CM | POA: Diagnosis not present

## 2020-10-04 DIAGNOSIS — D509 Iron deficiency anemia, unspecified: Secondary | ICD-10-CM | POA: Diagnosis not present

## 2020-10-04 DIAGNOSIS — N186 End stage renal disease: Secondary | ICD-10-CM | POA: Diagnosis not present

## 2020-10-04 DIAGNOSIS — N2581 Secondary hyperparathyroidism of renal origin: Secondary | ICD-10-CM | POA: Diagnosis not present

## 2020-10-05 DIAGNOSIS — N186 End stage renal disease: Secondary | ICD-10-CM | POA: Diagnosis not present

## 2020-10-05 DIAGNOSIS — D631 Anemia in chronic kidney disease: Secondary | ICD-10-CM | POA: Diagnosis not present

## 2020-10-05 DIAGNOSIS — D509 Iron deficiency anemia, unspecified: Secondary | ICD-10-CM | POA: Diagnosis not present

## 2020-10-05 DIAGNOSIS — Z992 Dependence on renal dialysis: Secondary | ICD-10-CM | POA: Diagnosis not present

## 2020-10-05 DIAGNOSIS — N2581 Secondary hyperparathyroidism of renal origin: Secondary | ICD-10-CM | POA: Diagnosis not present

## 2020-10-06 DIAGNOSIS — I671 Cerebral aneurysm, nonruptured: Secondary | ICD-10-CM | POA: Diagnosis not present

## 2020-10-06 DIAGNOSIS — Z87891 Personal history of nicotine dependence: Secondary | ICD-10-CM | POA: Diagnosis not present

## 2020-10-06 DIAGNOSIS — Z7902 Long term (current) use of antithrombotics/antiplatelets: Secondary | ICD-10-CM | POA: Diagnosis not present

## 2020-10-06 DIAGNOSIS — Z7982 Long term (current) use of aspirin: Secondary | ICD-10-CM | POA: Diagnosis not present

## 2020-10-07 DIAGNOSIS — D509 Iron deficiency anemia, unspecified: Secondary | ICD-10-CM | POA: Diagnosis not present

## 2020-10-07 DIAGNOSIS — N2581 Secondary hyperparathyroidism of renal origin: Secondary | ICD-10-CM | POA: Diagnosis not present

## 2020-10-07 DIAGNOSIS — Z992 Dependence on renal dialysis: Secondary | ICD-10-CM | POA: Diagnosis not present

## 2020-10-07 DIAGNOSIS — D631 Anemia in chronic kidney disease: Secondary | ICD-10-CM | POA: Diagnosis not present

## 2020-10-07 DIAGNOSIS — N186 End stage renal disease: Secondary | ICD-10-CM | POA: Diagnosis not present

## 2020-10-10 DIAGNOSIS — D631 Anemia in chronic kidney disease: Secondary | ICD-10-CM | POA: Diagnosis not present

## 2020-10-10 DIAGNOSIS — N2581 Secondary hyperparathyroidism of renal origin: Secondary | ICD-10-CM | POA: Diagnosis not present

## 2020-10-10 DIAGNOSIS — Z992 Dependence on renal dialysis: Secondary | ICD-10-CM | POA: Diagnosis not present

## 2020-10-10 DIAGNOSIS — N186 End stage renal disease: Secondary | ICD-10-CM | POA: Diagnosis not present

## 2020-10-10 DIAGNOSIS — D509 Iron deficiency anemia, unspecified: Secondary | ICD-10-CM | POA: Diagnosis not present

## 2020-10-11 DIAGNOSIS — G4733 Obstructive sleep apnea (adult) (pediatric): Secondary | ICD-10-CM | POA: Diagnosis not present

## 2020-10-12 DIAGNOSIS — D631 Anemia in chronic kidney disease: Secondary | ICD-10-CM | POA: Diagnosis not present

## 2020-10-12 DIAGNOSIS — N2581 Secondary hyperparathyroidism of renal origin: Secondary | ICD-10-CM | POA: Diagnosis not present

## 2020-10-12 DIAGNOSIS — N186 End stage renal disease: Secondary | ICD-10-CM | POA: Diagnosis not present

## 2020-10-12 DIAGNOSIS — D509 Iron deficiency anemia, unspecified: Secondary | ICD-10-CM | POA: Diagnosis not present

## 2020-10-12 DIAGNOSIS — Z992 Dependence on renal dialysis: Secondary | ICD-10-CM | POA: Diagnosis not present

## 2020-10-14 DIAGNOSIS — N2581 Secondary hyperparathyroidism of renal origin: Secondary | ICD-10-CM | POA: Diagnosis not present

## 2020-10-14 DIAGNOSIS — D509 Iron deficiency anemia, unspecified: Secondary | ICD-10-CM | POA: Diagnosis not present

## 2020-10-14 DIAGNOSIS — Z992 Dependence on renal dialysis: Secondary | ICD-10-CM | POA: Diagnosis not present

## 2020-10-14 DIAGNOSIS — N186 End stage renal disease: Secondary | ICD-10-CM | POA: Diagnosis not present

## 2020-10-14 DIAGNOSIS — D631 Anemia in chronic kidney disease: Secondary | ICD-10-CM | POA: Diagnosis not present

## 2020-10-16 DIAGNOSIS — Z992 Dependence on renal dialysis: Secondary | ICD-10-CM | POA: Diagnosis not present

## 2020-10-16 DIAGNOSIS — N186 End stage renal disease: Secondary | ICD-10-CM | POA: Diagnosis not present

## 2020-10-17 DIAGNOSIS — Z992 Dependence on renal dialysis: Secondary | ICD-10-CM | POA: Diagnosis not present

## 2020-10-17 DIAGNOSIS — D509 Iron deficiency anemia, unspecified: Secondary | ICD-10-CM | POA: Diagnosis not present

## 2020-10-17 DIAGNOSIS — N2581 Secondary hyperparathyroidism of renal origin: Secondary | ICD-10-CM | POA: Diagnosis not present

## 2020-10-17 DIAGNOSIS — D631 Anemia in chronic kidney disease: Secondary | ICD-10-CM | POA: Diagnosis not present

## 2020-10-17 DIAGNOSIS — N186 End stage renal disease: Secondary | ICD-10-CM | POA: Diagnosis not present

## 2020-10-19 DIAGNOSIS — N2581 Secondary hyperparathyroidism of renal origin: Secondary | ICD-10-CM | POA: Diagnosis not present

## 2020-10-19 DIAGNOSIS — Z992 Dependence on renal dialysis: Secondary | ICD-10-CM | POA: Diagnosis not present

## 2020-10-19 DIAGNOSIS — N186 End stage renal disease: Secondary | ICD-10-CM | POA: Diagnosis not present

## 2020-10-19 DIAGNOSIS — D509 Iron deficiency anemia, unspecified: Secondary | ICD-10-CM | POA: Diagnosis not present

## 2020-10-19 DIAGNOSIS — D631 Anemia in chronic kidney disease: Secondary | ICD-10-CM | POA: Diagnosis not present

## 2020-10-21 DIAGNOSIS — N2581 Secondary hyperparathyroidism of renal origin: Secondary | ICD-10-CM | POA: Diagnosis not present

## 2020-10-21 DIAGNOSIS — D631 Anemia in chronic kidney disease: Secondary | ICD-10-CM | POA: Diagnosis not present

## 2020-10-21 DIAGNOSIS — N186 End stage renal disease: Secondary | ICD-10-CM | POA: Diagnosis not present

## 2020-10-21 DIAGNOSIS — D509 Iron deficiency anemia, unspecified: Secondary | ICD-10-CM | POA: Diagnosis not present

## 2020-10-21 DIAGNOSIS — Z992 Dependence on renal dialysis: Secondary | ICD-10-CM | POA: Diagnosis not present

## 2020-10-24 DIAGNOSIS — D509 Iron deficiency anemia, unspecified: Secondary | ICD-10-CM | POA: Diagnosis not present

## 2020-10-24 DIAGNOSIS — Z992 Dependence on renal dialysis: Secondary | ICD-10-CM | POA: Diagnosis not present

## 2020-10-24 DIAGNOSIS — D631 Anemia in chronic kidney disease: Secondary | ICD-10-CM | POA: Diagnosis not present

## 2020-10-24 DIAGNOSIS — N186 End stage renal disease: Secondary | ICD-10-CM | POA: Diagnosis not present

## 2020-10-24 DIAGNOSIS — N2581 Secondary hyperparathyroidism of renal origin: Secondary | ICD-10-CM | POA: Diagnosis not present

## 2020-10-26 DIAGNOSIS — Z992 Dependence on renal dialysis: Secondary | ICD-10-CM | POA: Diagnosis not present

## 2020-10-26 DIAGNOSIS — D631 Anemia in chronic kidney disease: Secondary | ICD-10-CM | POA: Diagnosis not present

## 2020-10-26 DIAGNOSIS — D509 Iron deficiency anemia, unspecified: Secondary | ICD-10-CM | POA: Diagnosis not present

## 2020-10-26 DIAGNOSIS — N186 End stage renal disease: Secondary | ICD-10-CM | POA: Diagnosis not present

## 2020-10-26 DIAGNOSIS — N2581 Secondary hyperparathyroidism of renal origin: Secondary | ICD-10-CM | POA: Diagnosis not present

## 2020-10-28 DIAGNOSIS — N186 End stage renal disease: Secondary | ICD-10-CM | POA: Diagnosis not present

## 2020-10-28 DIAGNOSIS — D631 Anemia in chronic kidney disease: Secondary | ICD-10-CM | POA: Diagnosis not present

## 2020-10-28 DIAGNOSIS — Z992 Dependence on renal dialysis: Secondary | ICD-10-CM | POA: Diagnosis not present

## 2020-10-28 DIAGNOSIS — N2581 Secondary hyperparathyroidism of renal origin: Secondary | ICD-10-CM | POA: Diagnosis not present

## 2020-10-28 DIAGNOSIS — D509 Iron deficiency anemia, unspecified: Secondary | ICD-10-CM | POA: Diagnosis not present

## 2020-10-31 DIAGNOSIS — N186 End stage renal disease: Secondary | ICD-10-CM | POA: Diagnosis not present

## 2020-10-31 DIAGNOSIS — N2581 Secondary hyperparathyroidism of renal origin: Secondary | ICD-10-CM | POA: Diagnosis not present

## 2020-10-31 DIAGNOSIS — D631 Anemia in chronic kidney disease: Secondary | ICD-10-CM | POA: Diagnosis not present

## 2020-10-31 DIAGNOSIS — Z992 Dependence on renal dialysis: Secondary | ICD-10-CM | POA: Diagnosis not present

## 2020-10-31 DIAGNOSIS — D509 Iron deficiency anemia, unspecified: Secondary | ICD-10-CM | POA: Diagnosis not present

## 2020-11-01 ENCOUNTER — Ambulatory Visit: Payer: Self-pay | Admitting: Family Medicine

## 2020-11-02 DIAGNOSIS — D509 Iron deficiency anemia, unspecified: Secondary | ICD-10-CM | POA: Diagnosis not present

## 2020-11-02 DIAGNOSIS — N186 End stage renal disease: Secondary | ICD-10-CM | POA: Diagnosis not present

## 2020-11-02 DIAGNOSIS — N2581 Secondary hyperparathyroidism of renal origin: Secondary | ICD-10-CM | POA: Diagnosis not present

## 2020-11-02 DIAGNOSIS — D631 Anemia in chronic kidney disease: Secondary | ICD-10-CM | POA: Diagnosis not present

## 2020-11-02 DIAGNOSIS — Z992 Dependence on renal dialysis: Secondary | ICD-10-CM | POA: Diagnosis not present

## 2020-11-03 ENCOUNTER — Ambulatory Visit (INDEPENDENT_AMBULATORY_CARE_PROVIDER_SITE_OTHER): Payer: Medicare Other | Admitting: Family Medicine

## 2020-11-03 ENCOUNTER — Other Ambulatory Visit: Payer: Self-pay

## 2020-11-03 VITALS — HR 101 | Temp 98.1°F | Ht 73.0 in | Wt 265.0 lb

## 2020-11-03 DIAGNOSIS — I10 Essential (primary) hypertension: Secondary | ICD-10-CM | POA: Diagnosis not present

## 2020-11-03 DIAGNOSIS — G8929 Other chronic pain: Secondary | ICD-10-CM

## 2020-11-03 DIAGNOSIS — I120 Hypertensive chronic kidney disease with stage 5 chronic kidney disease or end stage renal disease: Secondary | ICD-10-CM

## 2020-11-03 DIAGNOSIS — G4733 Obstructive sleep apnea (adult) (pediatric): Secondary | ICD-10-CM

## 2020-11-03 DIAGNOSIS — R519 Headache, unspecified: Secondary | ICD-10-CM

## 2020-11-03 DIAGNOSIS — Z1211 Encounter for screening for malignant neoplasm of colon: Secondary | ICD-10-CM | POA: Diagnosis not present

## 2020-11-03 DIAGNOSIS — N186 End stage renal disease: Secondary | ICD-10-CM | POA: Diagnosis not present

## 2020-11-03 NOTE — Progress Notes (Signed)
Patient [facility]   Established Patient Office Visit  Subjective:  Patient ID: Michael [name], male    DOB: November 29, 1975  Age: 45 y.o. MRN: [id number]  CC:  Chief Complaint  Patient presents with   Follow-up    Wanting to lose weight, requesting pain meds.    HPI Michael [name] is a 45 year old male with a medical history significant for hypertension, subdural hematoma, history of end-stage renal disease on HD, hyperlipidemia, and history of polycystic kidney disease presents for follow-up of chronic conditions.  Patient recently had a subdural hematoma and was hospitalized on 08/02/2020.  He has been followed by neurologist at [facility].  Patient is status post right MMA embolization on 09/08/2020 and pipeline embolization of the pericallosal aneurysm on 09/22/2020.  Patient was discharged on aspirin 81 mg and Brilinta.  Patient has an additional aneurysm with a concerning morphology, neurosurgeon plans to treat in October 2022.  Patient says that he continues to have frontal headaches daily.  Headaches have been controlled on Percocet, patient states that he is run out of medications.  Patient has been receiving Percocet from his surgeon.  Patient states that his last prescription was filled on 10/06/2020.  Patient's neurosurgery provider stated that she will fill his pain medication 1 last time at his most recent appointment. Patient denies dizziness, blurred vision, chest pain, shortness of breath, urinary symptoms, nausea, vomiting, or diarrhea.  Past Medical History:  Diagnosis Date   Gout    History of renal dialysis    M-W-F   HLD (hyperlipidemia)    Hypertension    Pneumonia    Polycystic kidney disease    Sleep apnea 07/29/2018   uses cpap nightly   Vitamin D deficiency 11/2018    Past Surgical History:  Procedure Laterality Date   AV FISTULA PLACEMENT Left 03/24/2020   Procedure: INSERTION OF LEFT UPPER EXTREMITY  ARTERIOVENOUS (AV) GORE-TEX GRAFT;  Surgeon: [name], MD;  Location: [place];  Service: Vascular;  Laterality: Left;  PERIPHERAL NERVE BLOCK   [place] Left 12/11/2019   Procedure: 1ST STAGE BASILIC TRANSPOSITION OF LEFT ARM;  Surgeon: Angelia Mould, MD;  Location: [place];  Service: Vascular;  Laterality: Left;   IR FLUORO GUIDE CV LINE RIGHT  12/09/2019   IR US GUIDE VASC ACCESS RIGHT  12/09/2019   LEFT HEART CATH AND CORONARY ANGIOGRAPHY N/A 12/09/2019   Procedure: LEFT HEART CATH AND CORONARY ANGIOGRAPHY;  Surgeon: Adrian Prows, MD;  Location: [place] [facility];  Service: Cardiovascular;  Laterality: N/A;    Family History  Problem Relation Age of Onset   Bipolar disorder Father    Polycystic kidney disease Neg Hx     Social History   Socioeconomic History   Marital status: Single    Spouse name: Not on file   Number of children: Not on file   Years of education: Not on file   Highest education level: Not on file  Occupational History   Occupation: [employer]  Tobacco Use   Smoking status: Former    Packs/day: 0.20    Years: 10.00    Pack years: 2.00    Types: Cigarettes    Quit date: 2015    Years since quitting: 7.6   Smokeless tobacco: Never  Vaping Use   Vaping Use: Never used  Substance and Sexual Activity   Alcohol use: Not Currently    Comment: occasional wine   Drug use:  No   Sexual activity: Yes  Other Topics Concern   Not on file  Social History Narrative   Not on file   Social Determinants of Health   Financial Resource Strain: High Risk   Difficulty of Paying Living Expenses: Very hard  Food Insecurity: No Food Insecurity   Worried About Running Out of Food in the Last Year: Never true   Ran Out of Food in the Last Year: Never true  Transportation Needs: No Transportation Needs   Lack of Transportation (Medical): No   Lack of Transportation (Non-Medical): No  Physical Activity: Insufficiently Active   Days of Exercise  per Week: 3 days   Minutes of Exercise per Session: 30 min  Stress: Stress Concern Present   Feeling of Stress : To some extent  Social Connections: Moderately Integrated   Frequency of Communication with Friends and Family: More than three times a week   Frequency of Social Gatherings with Friends and Family: Once a week   Attends Religious Services: 1 to 4 times per year   Active Member of [employer] or Organizations: Yes   Attends Archivist Meetings: 1 to 4 times per year   Marital Status: Never married  Human resources officer Violence: Not At Risk   Fear of Current or Ex-Partner: No   Emotionally Abused: No   Physically Abused: No   Sexually Abused: No    Outpatient Medications Prior to Visit  Medication Sig Dispense Refill   acetaminophen (TYLENOL) 500 MG tablet Take 1,000 mg by mouth every 6 (six) hours as needed for mild pain.     Ascorbic Acid (VITAMIN C GUMMIE PO) Take 1 tablet by mouth every morning.     HYDROcodone-acetaminophen (NORCO/VICODIN) 5-325 MG tablet Take 1 tablet by mouth every 4 (four) hours as needed for moderate pain. 40 tablet 0   lidocaine-prilocaine (EMLA) cream Apply 1 application topically See admin instructions. Apply topically to port access one hour prior to dialysis - Monday, Wednesday, Friday     Multiple Vitamin (MULTIVITAMIN WITH MINERALS) TABS tablet Take 1 tablet by mouth every morning.     PRESCRIPTION MEDICATION Inhale into the lungs at bedtime. CPAP     calcium acetate (PHOSLO) 667 MG capsule Take 2,001 mg by mouth 3 (three) times daily with meals.     allopurinol (ZYLOPRIM) 100 MG tablet Take 100 mg by mouth 2 (two) times daily as needed (Gout). (Patient not taking: No sig reported)     ASPIRIN LOW DOSE 81 MG EC tablet Take 81 mg by mouth daily.     BRILINTA 90 MG TABS tablet Take 90 mg by mouth 2 (two) times daily.     senna-docusate (SENOKOT-S) 8.6-50 MG tablet Take 1 tablet by mouth at bedtime as needed for mild constipation. (Patient not  taking: Reported on 11/03/2020)     levETIRAcetam (KEPPRA) 500 MG tablet Take 1 tablet (500 mg total) by mouth at bedtime for 7 days. 7 tablet 0   No facility-administered medications prior to visit.    No Known Allergies  ROS Review of Systems  Constitutional: Negative.   HENT: Negative.    Eyes: Negative.   Respiratory: Negative.    Cardiovascular: Negative.   Gastrointestinal: Negative.   Endocrine: Negative.   Genitourinary: Negative.   Musculoskeletal: Negative.   Skin: Negative.   Neurological: Negative.   Hematological: Negative.   Psychiatric/Behavioral: Negative.       Objective:    Physical Exam Constitutional:      Appearance: Normal  appearance.  HENT:     Mouth/Throat:     Mouth: Mucous membranes are moist.  Eyes:     Pupils: Pupils are equal, round, and reactive to light.  Cardiovascular:     Rate and Rhythm: Normal rate.     Pulses: Normal pulses.  Pulmonary:     Effort: Pulmonary effort is normal.  Abdominal:     General: Bowel sounds are normal.  Skin:    General: Skin is warm.  Neurological:     General: No focal deficit present.     Mental Status: He is alert. Mental status is at baseline.  Psychiatric:        Mood and Affect: Mood normal.        Behavior: Behavior normal.        Thought Content: Thought content normal.        Judgment: Judgment normal.    Pulse (!) 101   Temp 98.1 F (36.7 C)   Ht 6\' 1"  (1.854 m)   Wt 265 lb (120.2 kg)   SpO2 98%   BMI 34.96 kg/m  Wt Readings from Last 3 Encounters:  11/03/20 265 lb (120.2 kg)  08/30/20 265 lb (120.2 kg)  07/18/20 264 lb 8.8 oz (120 kg)     Health Maintenance Due  Topic Date Due   COVID-19 Vaccine (3 - Booster for Moderna series) 02/16/2020   COLONOSCOPY (Pts 45-29yrs Insurance coverage will need to be confirmed)  Never done    There are no preventive care reminders to display for this patient.  Lab Results  Component Value Date   TSH 2.730 12/12/2018   Lab Results   Component Value Date   WBC 9.9 07/19/2020   HGB 10.4 (L) 07/19/2020   HCT 32.9 (L) 07/19/2020   MCV 88.2 07/19/2020   PLT 279 07/19/2020   Lab Results  Component Value Date   NA 135 07/19/2020   K 5.0 07/19/2020   CO2 26 07/19/2020   GLUCOSE 97 07/19/2020   BUN 63 (H) 07/19/2020   CREATININE 10.94 (H) 07/19/2020   BILITOT 0.1 (L) 07/19/2020   ALKPHOS 36 (L) 07/19/2020   AST 14 (L) 07/19/2020   ALT 19 07/19/2020   PROT 7.3 07/19/2020   ALBUMIN 3.5 07/19/2020   CALCIUM 7.4 (L) 07/19/2020   ANIONGAP 15 07/19/2020   Lab Results  Component Value Date   CHOL 173 12/02/2019   Lab Results  Component Value Date   HDL 25 (L) 12/02/2019   Lab Results  Component Value Date   LDLCALC 95 12/02/2019   Lab Results  Component Value Date   TRIG 267 (H) 12/02/2019   Lab Results  Component Value Date   CHOLHDL 6.9 12/02/2019   Lab Results  Component Value Date   HGBA1C 5.6 11/24/2019   HGBA1C 5.6 11/24/2019   HGBA1C 5.6 (A) 11/24/2019   HGBA1C 5.6 11/24/2019      Assessment & Plan:   Problem List Items Addressed This Visit   None  1. Chronic nonintractable headache, unspecified headache type After much discussion, patient feels that his headache may be stress related.  Did not recommend that patient continue Percocet for treatment headaches.  Recommend Tylenol 500 mg every 4 hours as needed.  Will defer to neurosurgery team for any further treatment of headache Tylenol.  2. End stage renal disease due to hypertension ([place]) Continue hemodialysis as scheduled  3. OSA (obstructive sleep apnea)   4. Essential hypertension, benign Patient was found to be hypotensive by  nephrology team.  He is no longer on antihypertensive medications.  5. Screening for colon cancer  - Ambulatory referral to Gastroenterology  Follow-up: Return in about 6 months (around 05/06/2021) for hypertension, obesity.    [name]  APRN, MSN, FNP-C Patient [place] [address] [place], [place] [address] [phone]

## 2020-11-03 NOTE — Patient Instructions (Addendum)
[place] [phone] (Work) [phone] [facility]) [place],  [place] [address]

## 2020-11-04 DIAGNOSIS — D509 Iron deficiency anemia, unspecified: Secondary | ICD-10-CM | POA: Diagnosis not present

## 2020-11-04 DIAGNOSIS — Z992 Dependence on renal dialysis: Secondary | ICD-10-CM | POA: Diagnosis not present

## 2020-11-04 DIAGNOSIS — N186 End stage renal disease: Secondary | ICD-10-CM | POA: Diagnosis not present

## 2020-11-04 DIAGNOSIS — D631 Anemia in chronic kidney disease: Secondary | ICD-10-CM | POA: Diagnosis not present

## 2020-11-04 DIAGNOSIS — N2581 Secondary hyperparathyroidism of renal origin: Secondary | ICD-10-CM | POA: Diagnosis not present

## 2020-11-07 DIAGNOSIS — Z992 Dependence on renal dialysis: Secondary | ICD-10-CM | POA: Diagnosis not present

## 2020-11-07 DIAGNOSIS — N186 End stage renal disease: Secondary | ICD-10-CM | POA: Diagnosis not present

## 2020-11-07 DIAGNOSIS — N2581 Secondary hyperparathyroidism of renal origin: Secondary | ICD-10-CM | POA: Diagnosis not present

## 2020-11-07 DIAGNOSIS — D631 Anemia in chronic kidney disease: Secondary | ICD-10-CM | POA: Diagnosis not present

## 2020-11-07 DIAGNOSIS — D509 Iron deficiency anemia, unspecified: Secondary | ICD-10-CM | POA: Diagnosis not present

## 2020-11-09 DIAGNOSIS — Z992 Dependence on renal dialysis: Secondary | ICD-10-CM | POA: Diagnosis not present

## 2020-11-09 DIAGNOSIS — N2581 Secondary hyperparathyroidism of renal origin: Secondary | ICD-10-CM | POA: Diagnosis not present

## 2020-11-09 DIAGNOSIS — N186 End stage renal disease: Secondary | ICD-10-CM | POA: Diagnosis not present

## 2020-11-09 DIAGNOSIS — D631 Anemia in chronic kidney disease: Secondary | ICD-10-CM | POA: Diagnosis not present

## 2020-11-09 DIAGNOSIS — D509 Iron deficiency anemia, unspecified: Secondary | ICD-10-CM | POA: Diagnosis not present

## 2020-11-11 DIAGNOSIS — D631 Anemia in chronic kidney disease: Secondary | ICD-10-CM | POA: Diagnosis not present

## 2020-11-11 DIAGNOSIS — G4733 Obstructive sleep apnea (adult) (pediatric): Secondary | ICD-10-CM | POA: Diagnosis not present

## 2020-11-11 DIAGNOSIS — N2581 Secondary hyperparathyroidism of renal origin: Secondary | ICD-10-CM | POA: Diagnosis not present

## 2020-11-11 DIAGNOSIS — N186 End stage renal disease: Secondary | ICD-10-CM | POA: Diagnosis not present

## 2020-11-11 DIAGNOSIS — Z992 Dependence on renal dialysis: Secondary | ICD-10-CM | POA: Diagnosis not present

## 2020-11-11 DIAGNOSIS — D509 Iron deficiency anemia, unspecified: Secondary | ICD-10-CM | POA: Diagnosis not present

## 2020-11-15 DIAGNOSIS — N2581 Secondary hyperparathyroidism of renal origin: Secondary | ICD-10-CM | POA: Diagnosis not present

## 2020-11-15 DIAGNOSIS — D509 Iron deficiency anemia, unspecified: Secondary | ICD-10-CM | POA: Diagnosis not present

## 2020-11-15 DIAGNOSIS — D631 Anemia in chronic kidney disease: Secondary | ICD-10-CM | POA: Diagnosis not present

## 2020-11-15 DIAGNOSIS — Z992 Dependence on renal dialysis: Secondary | ICD-10-CM | POA: Diagnosis not present

## 2020-11-15 DIAGNOSIS — N186 End stage renal disease: Secondary | ICD-10-CM | POA: Diagnosis not present

## 2020-11-16 DIAGNOSIS — Z992 Dependence on renal dialysis: Secondary | ICD-10-CM | POA: Diagnosis not present

## 2020-11-16 DIAGNOSIS — N2581 Secondary hyperparathyroidism of renal origin: Secondary | ICD-10-CM | POA: Diagnosis not present

## 2020-11-16 DIAGNOSIS — D631 Anemia in chronic kidney disease: Secondary | ICD-10-CM | POA: Diagnosis not present

## 2020-11-16 DIAGNOSIS — D509 Iron deficiency anemia, unspecified: Secondary | ICD-10-CM | POA: Diagnosis not present

## 2020-11-16 DIAGNOSIS — N186 End stage renal disease: Secondary | ICD-10-CM | POA: Diagnosis not present

## 2020-11-18 DIAGNOSIS — D631 Anemia in chronic kidney disease: Secondary | ICD-10-CM | POA: Diagnosis not present

## 2020-11-18 DIAGNOSIS — D509 Iron deficiency anemia, unspecified: Secondary | ICD-10-CM | POA: Diagnosis not present

## 2020-11-18 DIAGNOSIS — Z992 Dependence on renal dialysis: Secondary | ICD-10-CM | POA: Diagnosis not present

## 2020-11-18 DIAGNOSIS — N2581 Secondary hyperparathyroidism of renal origin: Secondary | ICD-10-CM | POA: Diagnosis not present

## 2020-11-18 DIAGNOSIS — N186 End stage renal disease: Secondary | ICD-10-CM | POA: Diagnosis not present

## 2020-11-21 DIAGNOSIS — Z992 Dependence on renal dialysis: Secondary | ICD-10-CM | POA: Diagnosis not present

## 2020-11-21 DIAGNOSIS — D631 Anemia in chronic kidney disease: Secondary | ICD-10-CM | POA: Diagnosis not present

## 2020-11-21 DIAGNOSIS — N186 End stage renal disease: Secondary | ICD-10-CM | POA: Diagnosis not present

## 2020-11-21 DIAGNOSIS — D509 Iron deficiency anemia, unspecified: Secondary | ICD-10-CM | POA: Diagnosis not present

## 2020-11-21 DIAGNOSIS — N2581 Secondary hyperparathyroidism of renal origin: Secondary | ICD-10-CM | POA: Diagnosis not present

## 2020-11-22 DIAGNOSIS — G4733 Obstructive sleep apnea (adult) (pediatric): Secondary | ICD-10-CM | POA: Diagnosis not present

## 2020-11-23 DIAGNOSIS — N186 End stage renal disease: Secondary | ICD-10-CM | POA: Diagnosis not present

## 2020-11-23 DIAGNOSIS — Z992 Dependence on renal dialysis: Secondary | ICD-10-CM | POA: Diagnosis not present

## 2020-11-23 DIAGNOSIS — N2581 Secondary hyperparathyroidism of renal origin: Secondary | ICD-10-CM | POA: Diagnosis not present

## 2020-11-23 DIAGNOSIS — D509 Iron deficiency anemia, unspecified: Secondary | ICD-10-CM | POA: Diagnosis not present

## 2020-11-23 DIAGNOSIS — D631 Anemia in chronic kidney disease: Secondary | ICD-10-CM | POA: Diagnosis not present

## 2020-11-25 DIAGNOSIS — D509 Iron deficiency anemia, unspecified: Secondary | ICD-10-CM | POA: Diagnosis not present

## 2020-11-25 DIAGNOSIS — Z992 Dependence on renal dialysis: Secondary | ICD-10-CM | POA: Diagnosis not present

## 2020-11-25 DIAGNOSIS — N186 End stage renal disease: Secondary | ICD-10-CM | POA: Diagnosis not present

## 2020-11-25 DIAGNOSIS — D631 Anemia in chronic kidney disease: Secondary | ICD-10-CM | POA: Diagnosis not present

## 2020-11-25 DIAGNOSIS — N2581 Secondary hyperparathyroidism of renal origin: Secondary | ICD-10-CM | POA: Diagnosis not present

## 2020-11-28 DIAGNOSIS — N2581 Secondary hyperparathyroidism of renal origin: Secondary | ICD-10-CM | POA: Diagnosis not present

## 2020-11-28 DIAGNOSIS — Z992 Dependence on renal dialysis: Secondary | ICD-10-CM | POA: Diagnosis not present

## 2020-11-28 DIAGNOSIS — D509 Iron deficiency anemia, unspecified: Secondary | ICD-10-CM | POA: Diagnosis not present

## 2020-11-28 DIAGNOSIS — D631 Anemia in chronic kidney disease: Secondary | ICD-10-CM | POA: Diagnosis not present

## 2020-11-28 DIAGNOSIS — N186 End stage renal disease: Secondary | ICD-10-CM | POA: Diagnosis not present

## 2020-11-30 DIAGNOSIS — D509 Iron deficiency anemia, unspecified: Secondary | ICD-10-CM | POA: Diagnosis not present

## 2020-11-30 DIAGNOSIS — Z992 Dependence on renal dialysis: Secondary | ICD-10-CM | POA: Diagnosis not present

## 2020-11-30 DIAGNOSIS — N186 End stage renal disease: Secondary | ICD-10-CM | POA: Diagnosis not present

## 2020-11-30 DIAGNOSIS — N2581 Secondary hyperparathyroidism of renal origin: Secondary | ICD-10-CM | POA: Diagnosis not present

## 2020-11-30 DIAGNOSIS — D631 Anemia in chronic kidney disease: Secondary | ICD-10-CM | POA: Diagnosis not present

## 2020-12-02 DIAGNOSIS — D509 Iron deficiency anemia, unspecified: Secondary | ICD-10-CM | POA: Diagnosis not present

## 2020-12-02 DIAGNOSIS — N2581 Secondary hyperparathyroidism of renal origin: Secondary | ICD-10-CM | POA: Diagnosis not present

## 2020-12-02 DIAGNOSIS — N186 End stage renal disease: Secondary | ICD-10-CM | POA: Diagnosis not present

## 2020-12-02 DIAGNOSIS — D631 Anemia in chronic kidney disease: Secondary | ICD-10-CM | POA: Diagnosis not present

## 2020-12-02 DIAGNOSIS — Z992 Dependence on renal dialysis: Secondary | ICD-10-CM | POA: Diagnosis not present

## 2020-12-05 DIAGNOSIS — D509 Iron deficiency anemia, unspecified: Secondary | ICD-10-CM | POA: Diagnosis not present

## 2020-12-05 DIAGNOSIS — Z992 Dependence on renal dialysis: Secondary | ICD-10-CM | POA: Diagnosis not present

## 2020-12-05 DIAGNOSIS — N186 End stage renal disease: Secondary | ICD-10-CM | POA: Diagnosis not present

## 2020-12-05 DIAGNOSIS — D631 Anemia in chronic kidney disease: Secondary | ICD-10-CM | POA: Diagnosis not present

## 2020-12-05 DIAGNOSIS — N2581 Secondary hyperparathyroidism of renal origin: Secondary | ICD-10-CM | POA: Diagnosis not present

## 2020-12-07 DIAGNOSIS — D509 Iron deficiency anemia, unspecified: Secondary | ICD-10-CM | POA: Diagnosis not present

## 2020-12-07 DIAGNOSIS — D631 Anemia in chronic kidney disease: Secondary | ICD-10-CM | POA: Diagnosis not present

## 2020-12-07 DIAGNOSIS — Z992 Dependence on renal dialysis: Secondary | ICD-10-CM | POA: Diagnosis not present

## 2020-12-07 DIAGNOSIS — N186 End stage renal disease: Secondary | ICD-10-CM | POA: Diagnosis not present

## 2020-12-07 DIAGNOSIS — N2581 Secondary hyperparathyroidism of renal origin: Secondary | ICD-10-CM | POA: Diagnosis not present

## 2020-12-09 DIAGNOSIS — D631 Anemia in chronic kidney disease: Secondary | ICD-10-CM | POA: Diagnosis not present

## 2020-12-09 DIAGNOSIS — D509 Iron deficiency anemia, unspecified: Secondary | ICD-10-CM | POA: Diagnosis not present

## 2020-12-09 DIAGNOSIS — N2581 Secondary hyperparathyroidism of renal origin: Secondary | ICD-10-CM | POA: Diagnosis not present

## 2020-12-09 DIAGNOSIS — Z992 Dependence on renal dialysis: Secondary | ICD-10-CM | POA: Diagnosis not present

## 2020-12-09 DIAGNOSIS — N186 End stage renal disease: Secondary | ICD-10-CM | POA: Diagnosis not present

## 2020-12-12 DIAGNOSIS — Z992 Dependence on renal dialysis: Secondary | ICD-10-CM | POA: Diagnosis not present

## 2020-12-12 DIAGNOSIS — G4733 Obstructive sleep apnea (adult) (pediatric): Secondary | ICD-10-CM | POA: Diagnosis not present

## 2020-12-12 DIAGNOSIS — D631 Anemia in chronic kidney disease: Secondary | ICD-10-CM | POA: Diagnosis not present

## 2020-12-12 DIAGNOSIS — D509 Iron deficiency anemia, unspecified: Secondary | ICD-10-CM | POA: Diagnosis not present

## 2020-12-12 DIAGNOSIS — N186 End stage renal disease: Secondary | ICD-10-CM | POA: Diagnosis not present

## 2020-12-12 DIAGNOSIS — N2581 Secondary hyperparathyroidism of renal origin: Secondary | ICD-10-CM | POA: Diagnosis not present

## 2020-12-14 DIAGNOSIS — D631 Anemia in chronic kidney disease: Secondary | ICD-10-CM | POA: Diagnosis not present

## 2020-12-14 DIAGNOSIS — D509 Iron deficiency anemia, unspecified: Secondary | ICD-10-CM | POA: Diagnosis not present

## 2020-12-14 DIAGNOSIS — Z992 Dependence on renal dialysis: Secondary | ICD-10-CM | POA: Diagnosis not present

## 2020-12-14 DIAGNOSIS — N2581 Secondary hyperparathyroidism of renal origin: Secondary | ICD-10-CM | POA: Diagnosis not present

## 2020-12-14 DIAGNOSIS — N186 End stage renal disease: Secondary | ICD-10-CM | POA: Diagnosis not present

## 2020-12-15 DIAGNOSIS — Z992 Dependence on renal dialysis: Secondary | ICD-10-CM | POA: Diagnosis not present

## 2020-12-15 DIAGNOSIS — D631 Anemia in chronic kidney disease: Secondary | ICD-10-CM | POA: Diagnosis not present

## 2020-12-15 DIAGNOSIS — N186 End stage renal disease: Secondary | ICD-10-CM | POA: Diagnosis not present

## 2020-12-15 DIAGNOSIS — N2581 Secondary hyperparathyroidism of renal origin: Secondary | ICD-10-CM | POA: Diagnosis not present

## 2020-12-15 DIAGNOSIS — D509 Iron deficiency anemia, unspecified: Secondary | ICD-10-CM | POA: Diagnosis not present

## 2020-12-16 DIAGNOSIS — N186 End stage renal disease: Secondary | ICD-10-CM | POA: Diagnosis not present

## 2020-12-16 DIAGNOSIS — Z992 Dependence on renal dialysis: Secondary | ICD-10-CM | POA: Diagnosis not present

## 2020-12-20 DIAGNOSIS — N186 End stage renal disease: Secondary | ICD-10-CM | POA: Diagnosis not present

## 2020-12-20 DIAGNOSIS — R52 Pain, unspecified: Secondary | ICD-10-CM | POA: Diagnosis not present

## 2020-12-20 DIAGNOSIS — D631 Anemia in chronic kidney disease: Secondary | ICD-10-CM | POA: Diagnosis not present

## 2020-12-20 DIAGNOSIS — Z992 Dependence on renal dialysis: Secondary | ICD-10-CM | POA: Diagnosis not present

## 2020-12-20 DIAGNOSIS — N2581 Secondary hyperparathyroidism of renal origin: Secondary | ICD-10-CM | POA: Diagnosis not present

## 2020-12-21 DIAGNOSIS — N2581 Secondary hyperparathyroidism of renal origin: Secondary | ICD-10-CM | POA: Diagnosis not present

## 2020-12-21 DIAGNOSIS — R52 Pain, unspecified: Secondary | ICD-10-CM | POA: Diagnosis not present

## 2020-12-21 DIAGNOSIS — Z992 Dependence on renal dialysis: Secondary | ICD-10-CM | POA: Diagnosis not present

## 2020-12-21 DIAGNOSIS — D631 Anemia in chronic kidney disease: Secondary | ICD-10-CM | POA: Diagnosis not present

## 2020-12-21 DIAGNOSIS — N186 End stage renal disease: Secondary | ICD-10-CM | POA: Diagnosis not present

## 2020-12-22 DIAGNOSIS — I120 Hypertensive chronic kidney disease with stage 5 chronic kidney disease or end stage renal disease: Secondary | ICD-10-CM | POA: Diagnosis not present

## 2020-12-22 DIAGNOSIS — Z7982 Long term (current) use of aspirin: Secondary | ICD-10-CM | POA: Diagnosis not present

## 2020-12-22 DIAGNOSIS — Z79899 Other long term (current) drug therapy: Secondary | ICD-10-CM | POA: Diagnosis not present

## 2020-12-22 DIAGNOSIS — I671 Cerebral aneurysm, nonruptured: Secondary | ICD-10-CM | POA: Diagnosis not present

## 2020-12-22 DIAGNOSIS — I151 Hypertension secondary to other renal disorders: Secondary | ICD-10-CM | POA: Diagnosis not present

## 2020-12-22 DIAGNOSIS — Z992 Dependence on renal dialysis: Secondary | ICD-10-CM | POA: Diagnosis not present

## 2020-12-22 DIAGNOSIS — G4733 Obstructive sleep apnea (adult) (pediatric): Secondary | ICD-10-CM | POA: Diagnosis not present

## 2020-12-22 DIAGNOSIS — N186 End stage renal disease: Secondary | ICD-10-CM | POA: Diagnosis not present

## 2020-12-22 DIAGNOSIS — Z9889 Other specified postprocedural states: Secondary | ICD-10-CM | POA: Diagnosis not present

## 2020-12-22 DIAGNOSIS — D631 Anemia in chronic kidney disease: Secondary | ICD-10-CM | POA: Diagnosis not present

## 2020-12-22 DIAGNOSIS — Z87891 Personal history of nicotine dependence: Secondary | ICD-10-CM | POA: Diagnosis not present

## 2020-12-22 DIAGNOSIS — N2581 Secondary hyperparathyroidism of renal origin: Secondary | ICD-10-CM | POA: Diagnosis not present

## 2020-12-22 DIAGNOSIS — Z7902 Long term (current) use of antithrombotics/antiplatelets: Secondary | ICD-10-CM | POA: Diagnosis not present

## 2020-12-23 DIAGNOSIS — Z992 Dependence on renal dialysis: Secondary | ICD-10-CM | POA: Diagnosis not present

## 2020-12-23 DIAGNOSIS — Z79899 Other long term (current) drug therapy: Secondary | ICD-10-CM | POA: Diagnosis not present

## 2020-12-23 DIAGNOSIS — N2581 Secondary hyperparathyroidism of renal origin: Secondary | ICD-10-CM | POA: Diagnosis not present

## 2020-12-23 DIAGNOSIS — N186 End stage renal disease: Secondary | ICD-10-CM | POA: Diagnosis not present

## 2020-12-23 DIAGNOSIS — I120 Hypertensive chronic kidney disease with stage 5 chronic kidney disease or end stage renal disease: Secondary | ICD-10-CM | POA: Diagnosis not present

## 2020-12-26 DIAGNOSIS — N186 End stage renal disease: Secondary | ICD-10-CM | POA: Diagnosis not present

## 2020-12-26 DIAGNOSIS — R52 Pain, unspecified: Secondary | ICD-10-CM | POA: Diagnosis not present

## 2020-12-26 DIAGNOSIS — N2581 Secondary hyperparathyroidism of renal origin: Secondary | ICD-10-CM | POA: Diagnosis not present

## 2020-12-26 DIAGNOSIS — Z992 Dependence on renal dialysis: Secondary | ICD-10-CM | POA: Diagnosis not present

## 2020-12-26 DIAGNOSIS — D631 Anemia in chronic kidney disease: Secondary | ICD-10-CM | POA: Diagnosis not present

## 2020-12-27 ENCOUNTER — Telehealth: Payer: Self-pay

## 2020-12-27 NOTE — Telephone Encounter (Signed)
Transition Care Management Unsuccessful Follow-up Telephone Call  Date of discharge and from where:  12/23/20 from [place]  Attempts:  1st Attempt  Reason for unsuccessful TCM follow-up call:  Left voice message   Thea Silversmith, RN, MSN, BSN, [place] Management Coordinator [phone]

## 2020-12-28 ENCOUNTER — Telehealth: Payer: Self-pay

## 2020-12-28 DIAGNOSIS — D631 Anemia in chronic kidney disease: Secondary | ICD-10-CM | POA: Diagnosis not present

## 2020-12-28 DIAGNOSIS — N2581 Secondary hyperparathyroidism of renal origin: Secondary | ICD-10-CM | POA: Diagnosis not present

## 2020-12-28 DIAGNOSIS — Z992 Dependence on renal dialysis: Secondary | ICD-10-CM | POA: Diagnosis not present

## 2020-12-28 DIAGNOSIS — R52 Pain, unspecified: Secondary | ICD-10-CM | POA: Diagnosis not present

## 2020-12-28 DIAGNOSIS — N186 End stage renal disease: Secondary | ICD-10-CM | POA: Diagnosis not present

## 2020-12-28 NOTE — Telephone Encounter (Signed)
Transition Care Management Follow-up Telephone Call Date of discharge and from where: 12/23/2020   [place] How have you been since you were released from the hospital? "Doing ok" Any questions or concerns? No  Items Reviewed: Did the pt receive and understand the discharge instructions provided? Yes  Medications obtained and verified? Yes  Other? No  Any new allergies since your discharge? No  Dietary orders reviewed? Yes Do you have support at home? Yes   Home Care and Equipment/Supplies: Were home health services ordered? not applicable If so, what is the name of the agency?  Has the agency set up a time to come to the patient's home? not applicable Were any new equipment or medical supplies ordered?  No What is the name of the medical supply agency?  Were you able to get the supplies/equipment? not applicable Do you have any questions related to the use of the equipment or supplies?   Functional Questionnaire: (I = Independent and D = Dependent) ADLs: I  Bathing/Dressing- I  Meal Prep- I  Eating- I  Maintaining continence- I  Transferring/Ambulation- I  Managing Meds- I  Follow up appointments reviewed PCP Hospital f/u appt confirmed? No   Specialist Hospital f/u appt confirmed? No   Are transportation arrangements needed? No  If their condition worsens, is the pt aware to call PCP or go to the Emergency Dept.? Yes Was the patient provided with contact information for the PCP's office or ED? Yes Was to pt encouraged to call back with questions or concerns? Yes  Tomasa Rand, RN, BSN, CEN [facility] [employer] [phone]

## 2020-12-30 DIAGNOSIS — N2581 Secondary hyperparathyroidism of renal origin: Secondary | ICD-10-CM | POA: Diagnosis not present

## 2020-12-30 DIAGNOSIS — Z992 Dependence on renal dialysis: Secondary | ICD-10-CM | POA: Diagnosis not present

## 2020-12-30 DIAGNOSIS — D631 Anemia in chronic kidney disease: Secondary | ICD-10-CM | POA: Diagnosis not present

## 2020-12-30 DIAGNOSIS — N186 End stage renal disease: Secondary | ICD-10-CM | POA: Diagnosis not present

## 2020-12-30 DIAGNOSIS — R52 Pain, unspecified: Secondary | ICD-10-CM | POA: Diagnosis not present

## 2021-01-02 DIAGNOSIS — Z992 Dependence on renal dialysis: Secondary | ICD-10-CM | POA: Diagnosis not present

## 2021-01-02 DIAGNOSIS — N186 End stage renal disease: Secondary | ICD-10-CM | POA: Diagnosis not present

## 2021-01-02 DIAGNOSIS — R52 Pain, unspecified: Secondary | ICD-10-CM | POA: Diagnosis not present

## 2021-01-02 DIAGNOSIS — N2581 Secondary hyperparathyroidism of renal origin: Secondary | ICD-10-CM | POA: Diagnosis not present

## 2021-01-02 DIAGNOSIS — D631 Anemia in chronic kidney disease: Secondary | ICD-10-CM | POA: Diagnosis not present

## 2021-01-04 DIAGNOSIS — K921 Melena: Secondary | ICD-10-CM | POA: Diagnosis not present

## 2021-01-04 DIAGNOSIS — R42 Dizziness and giddiness: Secondary | ICD-10-CM | POA: Diagnosis not present

## 2021-01-04 DIAGNOSIS — R519 Headache, unspecified: Secondary | ICD-10-CM | POA: Diagnosis not present

## 2021-01-04 DIAGNOSIS — R310 Gross hematuria: Secondary | ICD-10-CM | POA: Diagnosis not present

## 2021-01-04 DIAGNOSIS — Z7901 Long term (current) use of anticoagulants: Secondary | ICD-10-CM | POA: Diagnosis not present

## 2021-01-05 DIAGNOSIS — R52 Pain, unspecified: Secondary | ICD-10-CM | POA: Diagnosis not present

## 2021-01-05 DIAGNOSIS — N186 End stage renal disease: Secondary | ICD-10-CM | POA: Diagnosis not present

## 2021-01-05 DIAGNOSIS — D631 Anemia in chronic kidney disease: Secondary | ICD-10-CM | POA: Diagnosis not present

## 2021-01-05 DIAGNOSIS — N2581 Secondary hyperparathyroidism of renal origin: Secondary | ICD-10-CM | POA: Diagnosis not present

## 2021-01-05 DIAGNOSIS — Z992 Dependence on renal dialysis: Secondary | ICD-10-CM | POA: Diagnosis not present

## 2021-01-06 DIAGNOSIS — N186 End stage renal disease: Secondary | ICD-10-CM | POA: Diagnosis not present

## 2021-01-06 DIAGNOSIS — R52 Pain, unspecified: Secondary | ICD-10-CM | POA: Diagnosis not present

## 2021-01-06 DIAGNOSIS — N2581 Secondary hyperparathyroidism of renal origin: Secondary | ICD-10-CM | POA: Diagnosis not present

## 2021-01-06 DIAGNOSIS — D631 Anemia in chronic kidney disease: Secondary | ICD-10-CM | POA: Diagnosis not present

## 2021-01-06 DIAGNOSIS — Z992 Dependence on renal dialysis: Secondary | ICD-10-CM | POA: Diagnosis not present

## 2021-01-09 DIAGNOSIS — N186 End stage renal disease: Secondary | ICD-10-CM | POA: Diagnosis not present

## 2021-01-09 DIAGNOSIS — R52 Pain, unspecified: Secondary | ICD-10-CM | POA: Diagnosis not present

## 2021-01-09 DIAGNOSIS — N2581 Secondary hyperparathyroidism of renal origin: Secondary | ICD-10-CM | POA: Diagnosis not present

## 2021-01-09 DIAGNOSIS — D631 Anemia in chronic kidney disease: Secondary | ICD-10-CM | POA: Diagnosis not present

## 2021-01-09 DIAGNOSIS — Z992 Dependence on renal dialysis: Secondary | ICD-10-CM | POA: Diagnosis not present

## 2021-01-11 DIAGNOSIS — N2581 Secondary hyperparathyroidism of renal origin: Secondary | ICD-10-CM | POA: Diagnosis not present

## 2021-01-11 DIAGNOSIS — R52 Pain, unspecified: Secondary | ICD-10-CM | POA: Diagnosis not present

## 2021-01-11 DIAGNOSIS — Z992 Dependence on renal dialysis: Secondary | ICD-10-CM | POA: Diagnosis not present

## 2021-01-11 DIAGNOSIS — D631 Anemia in chronic kidney disease: Secondary | ICD-10-CM | POA: Diagnosis not present

## 2021-01-11 DIAGNOSIS — G4733 Obstructive sleep apnea (adult) (pediatric): Secondary | ICD-10-CM | POA: Diagnosis not present

## 2021-01-11 DIAGNOSIS — N186 End stage renal disease: Secondary | ICD-10-CM | POA: Diagnosis not present

## 2021-01-13 ENCOUNTER — Encounter: Payer: Self-pay | Admitting: Physician Assistant

## 2021-01-16 DIAGNOSIS — Z992 Dependence on renal dialysis: Secondary | ICD-10-CM | POA: Diagnosis not present

## 2021-01-16 DIAGNOSIS — D631 Anemia in chronic kidney disease: Secondary | ICD-10-CM | POA: Diagnosis not present

## 2021-01-16 DIAGNOSIS — N186 End stage renal disease: Secondary | ICD-10-CM | POA: Diagnosis not present

## 2021-01-16 DIAGNOSIS — R52 Pain, unspecified: Secondary | ICD-10-CM | POA: Diagnosis not present

## 2021-01-16 DIAGNOSIS — N2581 Secondary hyperparathyroidism of renal origin: Secondary | ICD-10-CM | POA: Diagnosis not present

## 2021-01-18 DIAGNOSIS — N2581 Secondary hyperparathyroidism of renal origin: Secondary | ICD-10-CM | POA: Diagnosis not present

## 2021-01-18 DIAGNOSIS — D631 Anemia in chronic kidney disease: Secondary | ICD-10-CM | POA: Diagnosis not present

## 2021-01-18 DIAGNOSIS — D509 Iron deficiency anemia, unspecified: Secondary | ICD-10-CM | POA: Diagnosis not present

## 2021-01-18 DIAGNOSIS — Z992 Dependence on renal dialysis: Secondary | ICD-10-CM | POA: Diagnosis not present

## 2021-01-18 DIAGNOSIS — N186 End stage renal disease: Secondary | ICD-10-CM | POA: Diagnosis not present

## 2021-01-20 DIAGNOSIS — N2581 Secondary hyperparathyroidism of renal origin: Secondary | ICD-10-CM | POA: Diagnosis not present

## 2021-01-20 DIAGNOSIS — Z992 Dependence on renal dialysis: Secondary | ICD-10-CM | POA: Diagnosis not present

## 2021-01-20 DIAGNOSIS — D631 Anemia in chronic kidney disease: Secondary | ICD-10-CM | POA: Diagnosis not present

## 2021-01-20 DIAGNOSIS — D509 Iron deficiency anemia, unspecified: Secondary | ICD-10-CM | POA: Diagnosis not present

## 2021-01-20 DIAGNOSIS — N186 End stage renal disease: Secondary | ICD-10-CM | POA: Diagnosis not present

## 2021-01-23 DIAGNOSIS — N2581 Secondary hyperparathyroidism of renal origin: Secondary | ICD-10-CM | POA: Diagnosis not present

## 2021-01-23 DIAGNOSIS — Z992 Dependence on renal dialysis: Secondary | ICD-10-CM | POA: Diagnosis not present

## 2021-01-23 DIAGNOSIS — N186 End stage renal disease: Secondary | ICD-10-CM | POA: Diagnosis not present

## 2021-01-23 DIAGNOSIS — D631 Anemia in chronic kidney disease: Secondary | ICD-10-CM | POA: Diagnosis not present

## 2021-01-23 DIAGNOSIS — D509 Iron deficiency anemia, unspecified: Secondary | ICD-10-CM | POA: Diagnosis not present

## 2021-01-25 DIAGNOSIS — D509 Iron deficiency anemia, unspecified: Secondary | ICD-10-CM | POA: Diagnosis not present

## 2021-01-25 DIAGNOSIS — N2581 Secondary hyperparathyroidism of renal origin: Secondary | ICD-10-CM | POA: Diagnosis not present

## 2021-01-25 DIAGNOSIS — D631 Anemia in chronic kidney disease: Secondary | ICD-10-CM | POA: Diagnosis not present

## 2021-01-25 DIAGNOSIS — N186 End stage renal disease: Secondary | ICD-10-CM | POA: Diagnosis not present

## 2021-01-25 DIAGNOSIS — Z992 Dependence on renal dialysis: Secondary | ICD-10-CM | POA: Diagnosis not present

## 2021-01-27 DIAGNOSIS — D509 Iron deficiency anemia, unspecified: Secondary | ICD-10-CM | POA: Diagnosis not present

## 2021-01-27 DIAGNOSIS — D631 Anemia in chronic kidney disease: Secondary | ICD-10-CM | POA: Diagnosis not present

## 2021-01-27 DIAGNOSIS — Z992 Dependence on renal dialysis: Secondary | ICD-10-CM | POA: Diagnosis not present

## 2021-01-27 DIAGNOSIS — N2581 Secondary hyperparathyroidism of renal origin: Secondary | ICD-10-CM | POA: Diagnosis not present

## 2021-01-27 DIAGNOSIS — N186 End stage renal disease: Secondary | ICD-10-CM | POA: Diagnosis not present

## 2021-01-30 DIAGNOSIS — Z992 Dependence on renal dialysis: Secondary | ICD-10-CM | POA: Diagnosis not present

## 2021-01-30 DIAGNOSIS — D631 Anemia in chronic kidney disease: Secondary | ICD-10-CM | POA: Diagnosis not present

## 2021-01-30 DIAGNOSIS — D509 Iron deficiency anemia, unspecified: Secondary | ICD-10-CM | POA: Diagnosis not present

## 2021-01-30 DIAGNOSIS — N2581 Secondary hyperparathyroidism of renal origin: Secondary | ICD-10-CM | POA: Diagnosis not present

## 2021-01-30 DIAGNOSIS — N186 End stage renal disease: Secondary | ICD-10-CM | POA: Diagnosis not present

## 2021-02-01 DIAGNOSIS — D631 Anemia in chronic kidney disease: Secondary | ICD-10-CM | POA: Diagnosis not present

## 2021-02-01 DIAGNOSIS — N186 End stage renal disease: Secondary | ICD-10-CM | POA: Diagnosis not present

## 2021-02-01 DIAGNOSIS — D509 Iron deficiency anemia, unspecified: Secondary | ICD-10-CM | POA: Diagnosis not present

## 2021-02-01 DIAGNOSIS — N2581 Secondary hyperparathyroidism of renal origin: Secondary | ICD-10-CM | POA: Diagnosis not present

## 2021-02-01 DIAGNOSIS — Z992 Dependence on renal dialysis: Secondary | ICD-10-CM | POA: Diagnosis not present

## 2021-02-02 ENCOUNTER — Ambulatory Visit: Payer: Medicare Other | Admitting: Physician Assistant

## 2021-02-03 DIAGNOSIS — D631 Anemia in chronic kidney disease: Secondary | ICD-10-CM | POA: Diagnosis not present

## 2021-02-03 DIAGNOSIS — N186 End stage renal disease: Secondary | ICD-10-CM | POA: Diagnosis not present

## 2021-02-03 DIAGNOSIS — Z992 Dependence on renal dialysis: Secondary | ICD-10-CM | POA: Diagnosis not present

## 2021-02-03 DIAGNOSIS — N2581 Secondary hyperparathyroidism of renal origin: Secondary | ICD-10-CM | POA: Diagnosis not present

## 2021-02-03 DIAGNOSIS — D509 Iron deficiency anemia, unspecified: Secondary | ICD-10-CM | POA: Diagnosis not present

## 2021-02-05 DIAGNOSIS — N186 End stage renal disease: Secondary | ICD-10-CM | POA: Diagnosis not present

## 2021-02-05 DIAGNOSIS — D631 Anemia in chronic kidney disease: Secondary | ICD-10-CM | POA: Diagnosis not present

## 2021-02-05 DIAGNOSIS — N2581 Secondary hyperparathyroidism of renal origin: Secondary | ICD-10-CM | POA: Diagnosis not present

## 2021-02-05 DIAGNOSIS — Z992 Dependence on renal dialysis: Secondary | ICD-10-CM | POA: Diagnosis not present

## 2021-02-05 DIAGNOSIS — D509 Iron deficiency anemia, unspecified: Secondary | ICD-10-CM | POA: Diagnosis not present

## 2021-02-07 DIAGNOSIS — D631 Anemia in chronic kidney disease: Secondary | ICD-10-CM | POA: Diagnosis not present

## 2021-02-07 DIAGNOSIS — N2581 Secondary hyperparathyroidism of renal origin: Secondary | ICD-10-CM | POA: Diagnosis not present

## 2021-02-07 DIAGNOSIS — D509 Iron deficiency anemia, unspecified: Secondary | ICD-10-CM | POA: Diagnosis not present

## 2021-02-07 DIAGNOSIS — N186 End stage renal disease: Secondary | ICD-10-CM | POA: Diagnosis not present

## 2021-02-07 DIAGNOSIS — Z992 Dependence on renal dialysis: Secondary | ICD-10-CM | POA: Diagnosis not present

## 2021-02-07 DIAGNOSIS — G4733 Obstructive sleep apnea (adult) (pediatric): Secondary | ICD-10-CM | POA: Diagnosis not present

## 2021-02-11 DIAGNOSIS — N2581 Secondary hyperparathyroidism of renal origin: Secondary | ICD-10-CM | POA: Diagnosis not present

## 2021-02-11 DIAGNOSIS — D509 Iron deficiency anemia, unspecified: Secondary | ICD-10-CM | POA: Diagnosis not present

## 2021-02-11 DIAGNOSIS — Z992 Dependence on renal dialysis: Secondary | ICD-10-CM | POA: Diagnosis not present

## 2021-02-11 DIAGNOSIS — G4733 Obstructive sleep apnea (adult) (pediatric): Secondary | ICD-10-CM | POA: Diagnosis not present

## 2021-02-11 DIAGNOSIS — N186 End stage renal disease: Secondary | ICD-10-CM | POA: Diagnosis not present

## 2021-02-11 DIAGNOSIS — D631 Anemia in chronic kidney disease: Secondary | ICD-10-CM | POA: Diagnosis not present

## 2021-02-13 DIAGNOSIS — D631 Anemia in chronic kidney disease: Secondary | ICD-10-CM | POA: Diagnosis not present

## 2021-02-13 DIAGNOSIS — N2581 Secondary hyperparathyroidism of renal origin: Secondary | ICD-10-CM | POA: Diagnosis not present

## 2021-02-13 DIAGNOSIS — Z992 Dependence on renal dialysis: Secondary | ICD-10-CM | POA: Diagnosis not present

## 2021-02-13 DIAGNOSIS — N186 End stage renal disease: Secondary | ICD-10-CM | POA: Diagnosis not present

## 2021-02-13 DIAGNOSIS — D509 Iron deficiency anemia, unspecified: Secondary | ICD-10-CM | POA: Diagnosis not present

## 2021-02-15 DIAGNOSIS — D631 Anemia in chronic kidney disease: Secondary | ICD-10-CM | POA: Diagnosis not present

## 2021-02-15 DIAGNOSIS — N2581 Secondary hyperparathyroidism of renal origin: Secondary | ICD-10-CM | POA: Diagnosis not present

## 2021-02-15 DIAGNOSIS — Z992 Dependence on renal dialysis: Secondary | ICD-10-CM | POA: Diagnosis not present

## 2021-02-15 DIAGNOSIS — D509 Iron deficiency anemia, unspecified: Secondary | ICD-10-CM | POA: Diagnosis not present

## 2021-02-15 DIAGNOSIS — N186 End stage renal disease: Secondary | ICD-10-CM | POA: Diagnosis not present

## 2021-02-17 DIAGNOSIS — D509 Iron deficiency anemia, unspecified: Secondary | ICD-10-CM | POA: Diagnosis not present

## 2021-02-17 DIAGNOSIS — R110 Nausea: Secondary | ICD-10-CM | POA: Diagnosis not present

## 2021-02-17 DIAGNOSIS — N186 End stage renal disease: Secondary | ICD-10-CM | POA: Diagnosis not present

## 2021-02-17 DIAGNOSIS — R209 Unspecified disturbances of skin sensation: Secondary | ICD-10-CM | POA: Diagnosis not present

## 2021-02-17 DIAGNOSIS — N2581 Secondary hyperparathyroidism of renal origin: Secondary | ICD-10-CM | POA: Diagnosis not present

## 2021-02-17 DIAGNOSIS — Z992 Dependence on renal dialysis: Secondary | ICD-10-CM | POA: Diagnosis not present

## 2021-02-17 DIAGNOSIS — D631 Anemia in chronic kidney disease: Secondary | ICD-10-CM | POA: Diagnosis not present

## 2021-02-20 DIAGNOSIS — R110 Nausea: Secondary | ICD-10-CM | POA: Diagnosis not present

## 2021-02-20 DIAGNOSIS — R209 Unspecified disturbances of skin sensation: Secondary | ICD-10-CM | POA: Diagnosis not present

## 2021-02-20 DIAGNOSIS — Z992 Dependence on renal dialysis: Secondary | ICD-10-CM | POA: Diagnosis not present

## 2021-02-20 DIAGNOSIS — N2581 Secondary hyperparathyroidism of renal origin: Secondary | ICD-10-CM | POA: Diagnosis not present

## 2021-02-20 DIAGNOSIS — N186 End stage renal disease: Secondary | ICD-10-CM | POA: Diagnosis not present

## 2021-02-20 DIAGNOSIS — D631 Anemia in chronic kidney disease: Secondary | ICD-10-CM | POA: Diagnosis not present

## 2021-02-20 DIAGNOSIS — D509 Iron deficiency anemia, unspecified: Secondary | ICD-10-CM | POA: Diagnosis not present

## 2021-02-22 DIAGNOSIS — D509 Iron deficiency anemia, unspecified: Secondary | ICD-10-CM | POA: Diagnosis not present

## 2021-02-22 DIAGNOSIS — N186 End stage renal disease: Secondary | ICD-10-CM | POA: Diagnosis not present

## 2021-02-22 DIAGNOSIS — R209 Unspecified disturbances of skin sensation: Secondary | ICD-10-CM | POA: Diagnosis not present

## 2021-02-22 DIAGNOSIS — Z992 Dependence on renal dialysis: Secondary | ICD-10-CM | POA: Diagnosis not present

## 2021-02-22 DIAGNOSIS — D631 Anemia in chronic kidney disease: Secondary | ICD-10-CM | POA: Diagnosis not present

## 2021-02-22 DIAGNOSIS — R110 Nausea: Secondary | ICD-10-CM | POA: Diagnosis not present

## 2021-02-22 DIAGNOSIS — N2581 Secondary hyperparathyroidism of renal origin: Secondary | ICD-10-CM | POA: Diagnosis not present

## 2021-02-23 ENCOUNTER — Telehealth: Payer: Self-pay

## 2021-02-23 ENCOUNTER — Encounter: Payer: Self-pay | Admitting: Internal Medicine

## 2021-02-23 ENCOUNTER — Ambulatory Visit (INDEPENDENT_AMBULATORY_CARE_PROVIDER_SITE_OTHER): Payer: Medicare Other | Admitting: Internal Medicine

## 2021-02-23 DIAGNOSIS — Z1211 Encounter for screening for malignant neoplasm of colon: Secondary | ICD-10-CM

## 2021-02-23 DIAGNOSIS — K5900 Constipation, unspecified: Secondary | ICD-10-CM | POA: Diagnosis not present

## 2021-02-23 MED ORDER — POLYETHYLENE GLYCOL 3350 17 G PO PACK: 17.0000 g | PACK | Freq: Every day | ORAL | 0 refills | Status: DC

## 2021-02-23 NOTE — Telephone Encounter (Signed)
   Michael [name] 1975/06/14 [id number]  Dear Barnetta Hammersmith, NP:  We have scheduled the above named patient for a colonoscopy procedure. Our records show that he is on anticoagulation therapy.  Please advise as to whether the patient may come off their therapy of Brilanta 5 days prior to their procedure which is scheduled for 03-06-21.  Please route your response to [place], [place] or fax response to [phone].  Sincerely,    [facility]

## 2021-02-23 NOTE — Progress Notes (Signed)
Chief Complaint: Colon cancer screening, constipation  HPI : 45 year old male with history of ESRD on HD, cerebral aneurysm s/p embolization on Brilinta, OSA on CPAP, HTN presents for colon cancer screening and constipation  Patient is currently being considered for kidney transplant and needs a colonoscopy for colon cancer screening. For the most part denies hematochezia, though a few months ago he was having bright red blood in the toilet bowl and when he wiped. Denies prior colonoscopy. Denies previous hemorrhoids. He tends to strain when he goes to the bathroom. Denies weight loss or changes in habits. Endorses constipation on occasion. On average he has a BM twice a day. He is not on anything for his constipation. Denies dysphagia. Has occasional pain in his flanks that he attributes to polycystic kidney disease. He takes Brilinta due to embolization of previous aneurysm.   Wt Readings from Last 3 Encounters:  02/23/21 268 lb 2 oz (121.6 kg)  11/03/20 265 lb (120.2 kg)  08/30/20 265 lb (120.2 kg)    Past Medical History:  Diagnosis Date   Gout    History of renal dialysis    M-W-F   HLD (hyperlipidemia)    Hypertension    Pneumonia    Polycystic kidney disease    Sleep apnea 07/29/2018   uses cpap nightly   Vitamin D deficiency 11/2018     Past Surgical History:  Procedure Laterality Date   AV FISTULA PLACEMENT Left 03/24/2020   Procedure: INSERTION OF LEFT UPPER EXTREMITY ARTERIOVENOUS (AV) GORE-TEX GRAFT;  Surgeon: [name], MD;  Location: [place];  Service: Vascular;  Laterality: Left;  PERIPHERAL NERVE BLOCK   [place] Left 12/11/2019   Procedure: 1ST STAGE BASILIC TRANSPOSITION OF LEFT ARM;  Surgeon: Angelia Mould, MD;  Location: [place];  Service: Vascular;  Laterality: Left;   IR FLUORO GUIDE CV LINE RIGHT  12/09/2019   IR US GUIDE VASC ACCESS RIGHT  12/09/2019   LEFT HEART CATH AND CORONARY ANGIOGRAPHY N/A 12/09/2019   Procedure: LEFT  HEART CATH AND CORONARY ANGIOGRAPHY;  Surgeon: Adrian Prows, MD;  Location: [place] [facility];  Service: Cardiovascular;  Laterality: N/A;   Family History  Problem Relation Age of Onset   Diabetes Mother    Ovarian cancer Mother    Bipolar disorder Father    Hypertension Father    Prostate cancer Father    Prostate cancer Paternal Grandfather    Polycystic kidney disease Neg Hx    Social History   Tobacco Use   Smoking status: Former    Packs/day: 0.20    Years: 10.00    Pack years: 2.00    Types: Cigarettes    Quit date: 2015    Years since quitting: 7.9   Smokeless tobacco: Never  Vaping Use   Vaping Use: Never used  Substance Use Topics   Alcohol use: Yes    Comment: occasional wine   Drug use: No   Current Outpatient Medications  Medication Sig Dispense Refill   acetaminophen (TYLENOL) 500 MG tablet Take 1,000 mg by mouth every 6 (six) hours as needed for mild pain.     Ascorbic Acid (VITAMIN C GUMMIE PO) Take 1 tablet by mouth every morning.     BRILINTA 90 MG TABS tablet Take 90 mg by mouth 2 (two) times daily.     calcium acetate (PHOSLO) 667 MG capsule Take 2,001 mg by mouth 3 (three) times daily.     Multiple Vitamin (MULTIVITAMIN WITH  MINERALS) TABS tablet Take 1 tablet by mouth every morning.     PRESCRIPTION MEDICATION Inhale into the lungs at bedtime. CPAP     No current facility-administered medications for this visit.   No Known Allergies   Review of Systems: All systems reviewed and negative except where noted in HPI.   Physical Exam: BP (!) 150/92 (BP Location: Right Arm, Patient Position: Sitting, Cuff Size: Normal)   Pulse (!) 104   Ht 5\' 11"  (1.803 m)   Wt 268 lb 2 oz (121.6 kg)   BMI 37.40 kg/m  Constitutional: Pleasant,well-developed, male in no acute distress. HEENT: Normocephalic and atraumatic. Conjunctivae are normal. No scleral icterus. Cardiovascular: Normal rate, regular rhythm.  Pulmonary/chest: Effort normal and breath sounds  normal. No wheezing, rales or rhonchi. Abdominal: Soft, nondistended, mildly tender in the bilateral flanks. Small umbilical hernia Extremities: No edema Neurological: Alert and oriented to person place and time. Skin: Skin is warm and dry. No rashes noted. Psychiatric: Normal mood and affect. Behavior is normal.  Labs 07/2020: CBC with low Hb of 10.4. BMP with BUN/Cr consistent with patient with ESRD on HD  ASSESSMENT AND PLAN: Colon cancer screening Constipation Presents for colonoscopy for colon cancer screening as part of his kidney transplant evaluation. Will start him on daily Miralax since he describes some underlying issues with constipation.  - Start Miralax QD to help with underlying constipation - Colonoscopy [facility] since patient is ESRD on HD. On Brilinta, which will need to be held 5 days ago. Will reach out to [facility] neurosurgery who prescribes his Brilinta for him.  Christia Reading, MD

## 2021-02-23 NOTE — Patient Instructions (Signed)
If you are age 45 or younger, your body mass index should be between 19-25. Your Body mass index is 37.4 kg/m. If this is out of the aformentioned range listed, please consider follow up with your Primary Care Provider.  ________________________________________________________  The [place] [facility] providers would like to encourage you to use [facility] to communicate with providers for non-urgent requests or questions.  Due to long hold times on the telephone, sending your provider a message by [facility] may be a faster and more efficient way to get a response.  Please allow 48 business hours for a response.  Please remember that this is for non-urgent requests.  _______________________________________________________  Michael [name] have been scheduled for a colonoscopy. Please follow written instructions given to you at your visit today.  Please pick up your prep supplies at the pharmacy within the next 1-3 days. If you use inhalers (even only as needed), please bring them with you on the day of your procedure.  Due to recent changes in healthcare laws, you may see the results of your imaging and laboratory studies on MyChart before your provider has had a chance to review them.  We understand that in some cases there may be results that are confusing or concerning to you. Not all laboratory results come back in the same time frame and the provider may be waiting for multiple results in order to interpret others.  Please give [place] 48 hours in order for your provider to thoroughly review all the results before contacting the office for clarification of your results.   Please purchase the following medications over the counter and take as directed:  START: Miralax one capsule daily.  Thank you for entrusting me with your care and choosing [facility].  Dr Lorenso Courier

## 2021-02-24 DIAGNOSIS — D631 Anemia in chronic kidney disease: Secondary | ICD-10-CM | POA: Diagnosis not present

## 2021-02-24 DIAGNOSIS — R110 Nausea: Secondary | ICD-10-CM | POA: Diagnosis not present

## 2021-02-24 DIAGNOSIS — N186 End stage renal disease: Secondary | ICD-10-CM | POA: Diagnosis not present

## 2021-02-24 DIAGNOSIS — D509 Iron deficiency anemia, unspecified: Secondary | ICD-10-CM | POA: Diagnosis not present

## 2021-02-24 DIAGNOSIS — R209 Unspecified disturbances of skin sensation: Secondary | ICD-10-CM | POA: Diagnosis not present

## 2021-02-24 DIAGNOSIS — Z992 Dependence on renal dialysis: Secondary | ICD-10-CM | POA: Diagnosis not present

## 2021-02-24 DIAGNOSIS — N2581 Secondary hyperparathyroidism of renal origin: Secondary | ICD-10-CM | POA: Diagnosis not present

## 2021-02-24 NOTE — Telephone Encounter (Signed)
Faxed clearance letter to [phone] on 02-24-21.  Left voicemail on 02-24-21 at 4:37pm at [phone] to have someone return call with questions or concerns regarding clearance.  Will continue efforts.

## 2021-02-27 DIAGNOSIS — N186 End stage renal disease: Secondary | ICD-10-CM | POA: Diagnosis not present

## 2021-02-27 DIAGNOSIS — R209 Unspecified disturbances of skin sensation: Secondary | ICD-10-CM | POA: Diagnosis not present

## 2021-02-27 DIAGNOSIS — D631 Anemia in chronic kidney disease: Secondary | ICD-10-CM | POA: Diagnosis not present

## 2021-02-27 DIAGNOSIS — D509 Iron deficiency anemia, unspecified: Secondary | ICD-10-CM | POA: Diagnosis not present

## 2021-02-27 DIAGNOSIS — N2581 Secondary hyperparathyroidism of renal origin: Secondary | ICD-10-CM | POA: Diagnosis not present

## 2021-02-27 DIAGNOSIS — Z992 Dependence on renal dialysis: Secondary | ICD-10-CM | POA: Diagnosis not present

## 2021-02-27 DIAGNOSIS — R110 Nausea: Secondary | ICD-10-CM | POA: Diagnosis not present

## 2021-02-27 NOTE — Telephone Encounter (Signed)
Stacy, RN called from [facility] and stated Dr Bridgette Habermann would prefer than patient be on continuous dual antiplatelet therapy for 3 months after cerebral aneurysm procedure completed on 12-22-20.  Dr Bridgette Habermann would prefer him to continue Brilinta until after March 24, 2021.

## 2021-03-01 DIAGNOSIS — D509 Iron deficiency anemia, unspecified: Secondary | ICD-10-CM | POA: Diagnosis not present

## 2021-03-01 DIAGNOSIS — R110 Nausea: Secondary | ICD-10-CM | POA: Diagnosis not present

## 2021-03-01 DIAGNOSIS — N2581 Secondary hyperparathyroidism of renal origin: Secondary | ICD-10-CM | POA: Diagnosis not present

## 2021-03-01 DIAGNOSIS — N186 End stage renal disease: Secondary | ICD-10-CM | POA: Diagnosis not present

## 2021-03-01 DIAGNOSIS — R209 Unspecified disturbances of skin sensation: Secondary | ICD-10-CM | POA: Diagnosis not present

## 2021-03-01 DIAGNOSIS — D631 Anemia in chronic kidney disease: Secondary | ICD-10-CM | POA: Diagnosis not present

## 2021-03-01 DIAGNOSIS — Z992 Dependence on renal dialysis: Secondary | ICD-10-CM | POA: Diagnosis not present

## 2021-03-01 NOTE — Telephone Encounter (Signed)
Patient returned call. States he can wait after 03/24/21 for his procedure but also does not want to wait till February if possible

## 2021-03-02 NOTE — Telephone Encounter (Signed)
Patient aware that his procedure has been rescheduled for 04-20-21 at 12pm at [facility].  Patient instructions were put in the mail for patient to review as his MyChart is not currently working.  Patient agreed to plan and verbalized understanding.

## 2021-03-03 DIAGNOSIS — N2581 Secondary hyperparathyroidism of renal origin: Secondary | ICD-10-CM | POA: Diagnosis not present

## 2021-03-03 DIAGNOSIS — N186 End stage renal disease: Secondary | ICD-10-CM | POA: Diagnosis not present

## 2021-03-03 DIAGNOSIS — R110 Nausea: Secondary | ICD-10-CM | POA: Diagnosis not present

## 2021-03-03 DIAGNOSIS — D509 Iron deficiency anemia, unspecified: Secondary | ICD-10-CM | POA: Diagnosis not present

## 2021-03-03 DIAGNOSIS — D631 Anemia in chronic kidney disease: Secondary | ICD-10-CM | POA: Diagnosis not present

## 2021-03-03 DIAGNOSIS — R209 Unspecified disturbances of skin sensation: Secondary | ICD-10-CM | POA: Diagnosis not present

## 2021-03-03 DIAGNOSIS — Z992 Dependence on renal dialysis: Secondary | ICD-10-CM | POA: Diagnosis not present

## 2021-03-06 ENCOUNTER — Encounter (HOSPITAL_COMMUNITY): Admission: RE | Payer: Self-pay | Source: Home / Self Care

## 2021-03-06 ENCOUNTER — Ambulatory Visit (HOSPITAL_COMMUNITY): Admission: RE | Admit: 2021-03-06 | Payer: Medicare Other | Source: Home / Self Care | Admitting: Internal Medicine

## 2021-03-06 DIAGNOSIS — D631 Anemia in chronic kidney disease: Secondary | ICD-10-CM | POA: Diagnosis not present

## 2021-03-06 DIAGNOSIS — Z992 Dependence on renal dialysis: Secondary | ICD-10-CM | POA: Diagnosis not present

## 2021-03-06 DIAGNOSIS — R110 Nausea: Secondary | ICD-10-CM | POA: Diagnosis not present

## 2021-03-06 DIAGNOSIS — R209 Unspecified disturbances of skin sensation: Secondary | ICD-10-CM | POA: Diagnosis not present

## 2021-03-06 DIAGNOSIS — N186 End stage renal disease: Secondary | ICD-10-CM | POA: Diagnosis not present

## 2021-03-06 DIAGNOSIS — N2581 Secondary hyperparathyroidism of renal origin: Secondary | ICD-10-CM | POA: Diagnosis not present

## 2021-03-06 DIAGNOSIS — D509 Iron deficiency anemia, unspecified: Secondary | ICD-10-CM | POA: Diagnosis not present

## 2021-03-06 SURGERY — COLONOSCOPY WITH PROPOFOL
Anesthesia: Monitor Anesthesia Care

## 2021-03-08 DIAGNOSIS — N2581 Secondary hyperparathyroidism of renal origin: Secondary | ICD-10-CM | POA: Diagnosis not present

## 2021-03-08 DIAGNOSIS — R209 Unspecified disturbances of skin sensation: Secondary | ICD-10-CM | POA: Diagnosis not present

## 2021-03-08 DIAGNOSIS — D631 Anemia in chronic kidney disease: Secondary | ICD-10-CM | POA: Diagnosis not present

## 2021-03-08 DIAGNOSIS — Z992 Dependence on renal dialysis: Secondary | ICD-10-CM | POA: Diagnosis not present

## 2021-03-08 DIAGNOSIS — D509 Iron deficiency anemia, unspecified: Secondary | ICD-10-CM | POA: Diagnosis not present

## 2021-03-08 DIAGNOSIS — N186 End stage renal disease: Secondary | ICD-10-CM | POA: Diagnosis not present

## 2021-03-08 DIAGNOSIS — R110 Nausea: Secondary | ICD-10-CM | POA: Diagnosis not present

## 2021-03-10 DIAGNOSIS — N186 End stage renal disease: Secondary | ICD-10-CM | POA: Diagnosis not present

## 2021-03-10 DIAGNOSIS — Z992 Dependence on renal dialysis: Secondary | ICD-10-CM | POA: Diagnosis not present

## 2021-03-10 DIAGNOSIS — R110 Nausea: Secondary | ICD-10-CM | POA: Diagnosis not present

## 2021-03-10 DIAGNOSIS — R209 Unspecified disturbances of skin sensation: Secondary | ICD-10-CM | POA: Diagnosis not present

## 2021-03-10 DIAGNOSIS — D631 Anemia in chronic kidney disease: Secondary | ICD-10-CM | POA: Diagnosis not present

## 2021-03-10 DIAGNOSIS — D509 Iron deficiency anemia, unspecified: Secondary | ICD-10-CM | POA: Diagnosis not present

## 2021-03-10 DIAGNOSIS — N2581 Secondary hyperparathyroidism of renal origin: Secondary | ICD-10-CM | POA: Diagnosis not present

## 2021-03-13 DIAGNOSIS — Z992 Dependence on renal dialysis: Secondary | ICD-10-CM | POA: Diagnosis not present

## 2021-03-13 DIAGNOSIS — D509 Iron deficiency anemia, unspecified: Secondary | ICD-10-CM | POA: Diagnosis not present

## 2021-03-13 DIAGNOSIS — N2581 Secondary hyperparathyroidism of renal origin: Secondary | ICD-10-CM | POA: Diagnosis not present

## 2021-03-13 DIAGNOSIS — R209 Unspecified disturbances of skin sensation: Secondary | ICD-10-CM | POA: Diagnosis not present

## 2021-03-13 DIAGNOSIS — G4733 Obstructive sleep apnea (adult) (pediatric): Secondary | ICD-10-CM | POA: Diagnosis not present

## 2021-03-13 DIAGNOSIS — D631 Anemia in chronic kidney disease: Secondary | ICD-10-CM | POA: Diagnosis not present

## 2021-03-13 DIAGNOSIS — N186 End stage renal disease: Secondary | ICD-10-CM | POA: Diagnosis not present

## 2021-03-13 DIAGNOSIS — R110 Nausea: Secondary | ICD-10-CM | POA: Diagnosis not present

## 2021-03-15 DIAGNOSIS — R209 Unspecified disturbances of skin sensation: Secondary | ICD-10-CM | POA: Diagnosis not present

## 2021-03-15 DIAGNOSIS — N186 End stage renal disease: Secondary | ICD-10-CM | POA: Diagnosis not present

## 2021-03-15 DIAGNOSIS — D631 Anemia in chronic kidney disease: Secondary | ICD-10-CM | POA: Diagnosis not present

## 2021-03-15 DIAGNOSIS — D509 Iron deficiency anemia, unspecified: Secondary | ICD-10-CM | POA: Diagnosis not present

## 2021-03-15 DIAGNOSIS — N2581 Secondary hyperparathyroidism of renal origin: Secondary | ICD-10-CM | POA: Diagnosis not present

## 2021-03-15 DIAGNOSIS — Z992 Dependence on renal dialysis: Secondary | ICD-10-CM | POA: Diagnosis not present

## 2021-03-15 DIAGNOSIS — R110 Nausea: Secondary | ICD-10-CM | POA: Diagnosis not present

## 2021-03-17 DIAGNOSIS — D509 Iron deficiency anemia, unspecified: Secondary | ICD-10-CM | POA: Diagnosis not present

## 2021-03-17 DIAGNOSIS — R209 Unspecified disturbances of skin sensation: Secondary | ICD-10-CM | POA: Diagnosis not present

## 2021-03-17 DIAGNOSIS — N2581 Secondary hyperparathyroidism of renal origin: Secondary | ICD-10-CM | POA: Diagnosis not present

## 2021-03-17 DIAGNOSIS — N186 End stage renal disease: Secondary | ICD-10-CM | POA: Diagnosis not present

## 2021-03-17 DIAGNOSIS — D631 Anemia in chronic kidney disease: Secondary | ICD-10-CM | POA: Diagnosis not present

## 2021-03-17 DIAGNOSIS — Z992 Dependence on renal dialysis: Secondary | ICD-10-CM | POA: Diagnosis not present

## 2021-03-17 DIAGNOSIS — R110 Nausea: Secondary | ICD-10-CM | POA: Diagnosis not present

## 2021-03-18 DIAGNOSIS — N186 End stage renal disease: Secondary | ICD-10-CM | POA: Diagnosis not present

## 2021-03-18 DIAGNOSIS — Z992 Dependence on renal dialysis: Secondary | ICD-10-CM | POA: Diagnosis not present

## 2021-03-20 DIAGNOSIS — Z992 Dependence on renal dialysis: Secondary | ICD-10-CM | POA: Diagnosis not present

## 2021-03-20 DIAGNOSIS — D509 Iron deficiency anemia, unspecified: Secondary | ICD-10-CM | POA: Diagnosis not present

## 2021-03-20 DIAGNOSIS — N186 End stage renal disease: Secondary | ICD-10-CM | POA: Diagnosis not present

## 2021-03-20 DIAGNOSIS — N2581 Secondary hyperparathyroidism of renal origin: Secondary | ICD-10-CM | POA: Diagnosis not present

## 2021-03-20 DIAGNOSIS — R209 Unspecified disturbances of skin sensation: Secondary | ICD-10-CM | POA: Diagnosis not present

## 2021-03-20 DIAGNOSIS — D631 Anemia in chronic kidney disease: Secondary | ICD-10-CM | POA: Diagnosis not present

## 2021-03-22 DIAGNOSIS — N2581 Secondary hyperparathyroidism of renal origin: Secondary | ICD-10-CM | POA: Diagnosis not present

## 2021-03-22 DIAGNOSIS — D509 Iron deficiency anemia, unspecified: Secondary | ICD-10-CM | POA: Diagnosis not present

## 2021-03-22 DIAGNOSIS — R209 Unspecified disturbances of skin sensation: Secondary | ICD-10-CM | POA: Diagnosis not present

## 2021-03-22 DIAGNOSIS — D631 Anemia in chronic kidney disease: Secondary | ICD-10-CM | POA: Diagnosis not present

## 2021-03-22 DIAGNOSIS — Z992 Dependence on renal dialysis: Secondary | ICD-10-CM | POA: Diagnosis not present

## 2021-03-22 DIAGNOSIS — N186 End stage renal disease: Secondary | ICD-10-CM | POA: Diagnosis not present

## 2021-03-24 ENCOUNTER — Telehealth: Payer: Self-pay | Admitting: Internal Medicine

## 2021-03-24 DIAGNOSIS — N186 End stage renal disease: Secondary | ICD-10-CM | POA: Diagnosis not present

## 2021-03-24 DIAGNOSIS — Z992 Dependence on renal dialysis: Secondary | ICD-10-CM | POA: Diagnosis not present

## 2021-03-24 DIAGNOSIS — R209 Unspecified disturbances of skin sensation: Secondary | ICD-10-CM | POA: Diagnosis not present

## 2021-03-24 DIAGNOSIS — N2581 Secondary hyperparathyroidism of renal origin: Secondary | ICD-10-CM | POA: Diagnosis not present

## 2021-03-24 DIAGNOSIS — K5900 Constipation, unspecified: Secondary | ICD-10-CM

## 2021-03-24 DIAGNOSIS — D631 Anemia in chronic kidney disease: Secondary | ICD-10-CM | POA: Diagnosis not present

## 2021-03-24 DIAGNOSIS — D509 Iron deficiency anemia, unspecified: Secondary | ICD-10-CM | POA: Diagnosis not present

## 2021-03-24 DIAGNOSIS — Z1211 Encounter for screening for malignant neoplasm of colon: Secondary | ICD-10-CM

## 2021-03-24 NOTE — Telephone Encounter (Signed)
Returned pt call. Advised we are aware of his urgency and reminded that he has been placed on a move up list. We have been frequently reviewing the schedules for an openings, but at this time we do not currently have any openings. Verbalized acceptance and understanding.

## 2021-03-24 NOTE — Telephone Encounter (Signed)
Inbound call from patient requesting a call back to discuss having procedure done sooner than 2/2 @ [facility]. States he cannot get on Kidney transplant list until colonoscopy is done

## 2021-03-27 DIAGNOSIS — R209 Unspecified disturbances of skin sensation: Secondary | ICD-10-CM | POA: Diagnosis not present

## 2021-03-27 DIAGNOSIS — D509 Iron deficiency anemia, unspecified: Secondary | ICD-10-CM | POA: Diagnosis not present

## 2021-03-27 DIAGNOSIS — N2581 Secondary hyperparathyroidism of renal origin: Secondary | ICD-10-CM | POA: Diagnosis not present

## 2021-03-27 DIAGNOSIS — D631 Anemia in chronic kidney disease: Secondary | ICD-10-CM | POA: Diagnosis not present

## 2021-03-27 DIAGNOSIS — N186 End stage renal disease: Secondary | ICD-10-CM | POA: Diagnosis not present

## 2021-03-27 DIAGNOSIS — Z992 Dependence on renal dialysis: Secondary | ICD-10-CM | POA: Diagnosis not present

## 2021-03-28 NOTE — Telephone Encounter (Signed)
SECOND ATTEMPT:  Called pt to inform about newly scheduled procedure date/time/location/provider. States he is currently driving and will call back to request the information. Pt also informed the this information has been sent via My Chart. States he is unable to access. Advised I will help with reset username/password at the time of his call. Verbalized acceptance and understanding.

## 2021-03-28 NOTE — Telephone Encounter (Signed)
Updated prep instructions sent via My Chart.

## 2021-03-28 NOTE — Addendum Note (Signed)
Addended by: Hardie Pulley, Shloimy Michalski J on: 03/28/2021 12:19 PM   Modules accepted: Orders

## 2021-03-28 NOTE — Telephone Encounter (Signed)
Per pt request and per Dr.'s Tarri Glenn' and Dorsey's approval, pt procedure has been rescheduled to 04/06/21 @ 730am, arrival time 6am. Called pt to make him aware but was unable to reach. LVM requesting returned call.

## 2021-03-29 ENCOUNTER — Encounter (HOSPITAL_COMMUNITY): Payer: Self-pay | Admitting: Gastroenterology

## 2021-03-29 DIAGNOSIS — Z992 Dependence on renal dialysis: Secondary | ICD-10-CM | POA: Diagnosis not present

## 2021-03-29 DIAGNOSIS — N186 End stage renal disease: Secondary | ICD-10-CM | POA: Diagnosis not present

## 2021-03-29 DIAGNOSIS — N2581 Secondary hyperparathyroidism of renal origin: Secondary | ICD-10-CM | POA: Diagnosis not present

## 2021-03-29 DIAGNOSIS — D631 Anemia in chronic kidney disease: Secondary | ICD-10-CM | POA: Diagnosis not present

## 2021-03-29 DIAGNOSIS — R209 Unspecified disturbances of skin sensation: Secondary | ICD-10-CM | POA: Diagnosis not present

## 2021-03-29 DIAGNOSIS — D509 Iron deficiency anemia, unspecified: Secondary | ICD-10-CM | POA: Diagnosis not present

## 2021-03-29 NOTE — Telephone Encounter (Signed)
Called pt to ensure he is aware of procedure date/time/location. Also assisted pt with re-setting My Chart password. While on the phone, pt was able to successfully log in to My Chart and view prep instructions. No further actions required at this time. No further questions or concerns voiced at this time.

## 2021-03-31 DIAGNOSIS — R209 Unspecified disturbances of skin sensation: Secondary | ICD-10-CM | POA: Diagnosis not present

## 2021-03-31 DIAGNOSIS — N2581 Secondary hyperparathyroidism of renal origin: Secondary | ICD-10-CM | POA: Diagnosis not present

## 2021-03-31 DIAGNOSIS — D631 Anemia in chronic kidney disease: Secondary | ICD-10-CM | POA: Diagnosis not present

## 2021-03-31 DIAGNOSIS — D509 Iron deficiency anemia, unspecified: Secondary | ICD-10-CM | POA: Diagnosis not present

## 2021-03-31 DIAGNOSIS — N186 End stage renal disease: Secondary | ICD-10-CM | POA: Diagnosis not present

## 2021-03-31 DIAGNOSIS — Z992 Dependence on renal dialysis: Secondary | ICD-10-CM | POA: Diagnosis not present

## 2021-04-03 DIAGNOSIS — N186 End stage renal disease: Secondary | ICD-10-CM | POA: Diagnosis not present

## 2021-04-03 DIAGNOSIS — Z992 Dependence on renal dialysis: Secondary | ICD-10-CM | POA: Diagnosis not present

## 2021-04-03 DIAGNOSIS — R209 Unspecified disturbances of skin sensation: Secondary | ICD-10-CM | POA: Diagnosis not present

## 2021-04-03 DIAGNOSIS — D631 Anemia in chronic kidney disease: Secondary | ICD-10-CM | POA: Diagnosis not present

## 2021-04-03 DIAGNOSIS — N2581 Secondary hyperparathyroidism of renal origin: Secondary | ICD-10-CM | POA: Diagnosis not present

## 2021-04-03 DIAGNOSIS — D509 Iron deficiency anemia, unspecified: Secondary | ICD-10-CM | POA: Diagnosis not present

## 2021-04-05 DIAGNOSIS — D509 Iron deficiency anemia, unspecified: Secondary | ICD-10-CM | POA: Diagnosis not present

## 2021-04-05 DIAGNOSIS — Z992 Dependence on renal dialysis: Secondary | ICD-10-CM | POA: Diagnosis not present

## 2021-04-05 DIAGNOSIS — D631 Anemia in chronic kidney disease: Secondary | ICD-10-CM | POA: Diagnosis not present

## 2021-04-05 DIAGNOSIS — N2581 Secondary hyperparathyroidism of renal origin: Secondary | ICD-10-CM | POA: Diagnosis not present

## 2021-04-05 DIAGNOSIS — N186 End stage renal disease: Secondary | ICD-10-CM | POA: Diagnosis not present

## 2021-04-05 DIAGNOSIS — R209 Unspecified disturbances of skin sensation: Secondary | ICD-10-CM | POA: Diagnosis not present

## 2021-04-05 NOTE — Anesthesia Preprocedure Evaluation (Addendum)
Anesthesia Evaluation  Patient identified by MRN, date of birth, ID band Patient awake    Reviewed: Allergy & Precautions, NPO status , Patient's Chart, lab work & pertinent test results  History of Anesthesia Complications Negative for: history of anesthetic complications  Airway Mallampati: II  TM Distance: >3 FB Neck ROM: Full    Dental no notable dental hx. (+) Dental Advisory Given   Pulmonary sleep apnea and Continuous Positive Airway Pressure Ventilation , former smoker,    Pulmonary exam normal        Cardiovascular hypertension, Pt. on medications Normal cardiovascular exam  TTE 2021 1. Left ventricular ejection fraction, by estimation, is 60 to 65%. The left ventricle has normal function. The left ventricle has no regional wall motion abnormalities. There is severe left ventricular wall thickening. Wall thickness is 2.5 cm average, concerning for infiltrative cardiomyopathy such as amyloidosis. Impaired diastolic function with indeterminate LV filling pressure.  2. The aortic valve is tricuspid. Aortic valve regurgitation is not visualized.  3. The pericardial effusion is localized near the right ventricle and circumferential.  4. The inferior vena cava is dilated in size with >50% respiratory variability, suggesting right atrial pressure of 8 mmHg.  5. Right ventricular systolic function is normal. The right ventricular size is normal. There is normal pulmonary artery systolic pressure. The estimated right ventricular systolic pressure is 15.0 mmHg.  6. The mitral valve is grossly normal. Trivial mitral valve  regurgitation.   Left Heart Catheterization 12/09/19:  Normal coronary arteries, tortuous vessels suggestive of hypertension with hypertensive heart disease.  Hyperdynamic LVEF at 70%.  Moderately elevated LVEDP.  30 mL contrast utilized.  Right femoral arterial access closed with Perclose.     Neuro/Psych negative neurological ROS  negative psych ROS   GI/Hepatic negative GI ROS, Neg liver ROS,   Endo/Other  negative endocrine ROS  Renal/GU Dialysis and ESRFRenal disease (diaylsis M W F, had dialysis yesterday)  negative genitourinary   Musculoskeletal  (+) Arthritis ,   Abdominal   Peds  Hematology negative hematology ROS (+)   Anesthesia Other Findings   Reproductive/Obstetrics                            Anesthesia Physical  Anesthesia Plan  ASA: 3  Anesthesia Plan: MAC   Post-op Pain Management:  Regional for Post-op pain and Minimal or no pain anticipated   Induction: Intravenous  PONV Risk Score and Plan: 1 and Propofol infusion, Treatment may vary due to age or medical condition, Midazolam and Ondansetron  Airway Management Planned: Natural Airway  Additional Equipment:   Intra-op Plan:   Post-operative Plan:   Informed Consent: I have reviewed the patients History and Physical, chart, labs and discussed the procedure including the risks, benefits and alternatives for the proposed anesthesia with the patient or authorized representative who has indicated his/her understanding and acceptance.     Dental advisory given  Plan Discussed with: Anesthesiologist  Anesthesia Plan Comments:        Anesthesia Quick Evaluation

## 2021-04-06 ENCOUNTER — Ambulatory Visit (HOSPITAL_COMMUNITY): Payer: Medicare Other | Admitting: Anesthesiology

## 2021-04-06 ENCOUNTER — Ambulatory Visit (HOSPITAL_COMMUNITY)
Admission: RE | Admit: 2021-04-06 | Discharge: 2021-04-06 | Disposition: A | Discharge status: Home / Self Care | Payer: Medicare Other | Attending: Gastroenterology | Admitting: Gastroenterology

## 2021-04-06 ENCOUNTER — Encounter (HOSPITAL_COMMUNITY)
Admission: RE | Disposition: A | Discharge status: Home / Self Care | Payer: Self-pay | Source: Home / Self Care | Attending: Gastroenterology

## 2021-04-06 ENCOUNTER — Other Ambulatory Visit: Payer: Self-pay

## 2021-04-06 ENCOUNTER — Telehealth: Payer: Self-pay | Admitting: Genetic Counselor

## 2021-04-06 ENCOUNTER — Encounter (HOSPITAL_COMMUNITY): Payer: Self-pay | Admitting: Gastroenterology

## 2021-04-06 DIAGNOSIS — D125 Benign neoplasm of sigmoid colon: Secondary | ICD-10-CM | POA: Diagnosis not present

## 2021-04-06 DIAGNOSIS — K5900 Constipation, unspecified: Secondary | ICD-10-CM | POA: Diagnosis not present

## 2021-04-06 DIAGNOSIS — K635 Polyp of colon: Secondary | ICD-10-CM | POA: Diagnosis not present

## 2021-04-06 DIAGNOSIS — Z1211 Encounter for screening for malignant neoplasm of colon: Secondary | ICD-10-CM | POA: Diagnosis not present

## 2021-04-06 DIAGNOSIS — D120 Benign neoplasm of cecum: Secondary | ICD-10-CM | POA: Insufficient documentation

## 2021-04-06 DIAGNOSIS — D122 Benign neoplasm of ascending colon: Secondary | ICD-10-CM | POA: Insufficient documentation

## 2021-04-06 DIAGNOSIS — Z9189 Other specified personal risk factors, not elsewhere classified: Secondary | ICD-10-CM

## 2021-04-06 DIAGNOSIS — G4733 Obstructive sleep apnea (adult) (pediatric): Secondary | ICD-10-CM | POA: Diagnosis not present

## 2021-04-06 DIAGNOSIS — Z9989 Dependence on other enabling machines and devices: Secondary | ICD-10-CM | POA: Diagnosis not present

## 2021-04-06 HISTORY — PX: SUBMUCOSAL TATTOO INJECTION: SHX6856

## 2021-04-06 HISTORY — PX: BIOPSY: SHX5522

## 2021-04-06 HISTORY — PX: POLYPECTOMY: SHX5525

## 2021-04-06 HISTORY — PX: COLONOSCOPY WITH PROPOFOL: SHX5780

## 2021-04-06 SURGERY — COLONOSCOPY WITH PROPOFOL
Anesthesia: Monitor Anesthesia Care

## 2021-04-06 MED ORDER — PROPOFOL 500 MG/50ML IV EMUL
INTRAVENOUS | Status: AC
  Filled 2021-04-06: qty 50

## 2021-04-06 MED ORDER — PROPOFOL 10 MG/ML IV BOLUS
INTRAVENOUS | Status: DC | PRN
Start: 2021-04-06 — End: 2021-04-06
  Administered 2021-04-06: 30 mg via INTRAVENOUS
  Administered 2021-04-06: 70 mg via INTRAVENOUS

## 2021-04-06 MED ORDER — PROPOFOL 500 MG/50ML IV EMUL
INTRAVENOUS | Status: DC | PRN
  Administered 2021-04-06: 150 ug/kg/min via INTRAVENOUS

## 2021-04-06 MED ORDER — PROPOFOL 1000 MG/100ML IV EMUL
INTRAVENOUS | Status: AC
  Filled 2021-04-06: qty 100

## 2021-04-06 MED ORDER — SPOT INK MARKER SYRINGE KIT
PACK | SUBMUCOSAL | Status: DC | PRN
  Administered 2021-04-06: 3 mL via SUBMUCOSAL

## 2021-04-06 MED ORDER — LIDOCAINE 2% (20 MG/ML) 5 ML SYRINGE
INTRAMUSCULAR | Status: DC | PRN
  Administered 2021-04-06: 100 mg via INTRAVENOUS

## 2021-04-06 MED ORDER — SODIUM CHLORIDE 0.9 % IV SOLN: INTRAVENOUS | Status: DC

## 2021-04-06 MED ORDER — PHENYLEPHRINE 40 MCG/ML (10ML) SYRINGE FOR IV PUSH (FOR BLOOD PRESSURE SUPPORT)
PREFILLED_SYRINGE | INTRAVENOUS | Status: DC | PRN
  Administered 2021-04-06 (×4): 80 ug via INTRAVENOUS

## 2021-04-06 SURGICAL SUPPLY — 22 items

## 2021-04-06 NOTE — H&P (Signed)
° °  Referring Provider: No ref. provider found Primary Care Physician:  [name], FNP  Reason for Procedure:  Colon cancer screening   IMPRESSION:  Need for colon cancer screening Brilinta has been on held for 5 days Appropriate candidate for monitored anesthesia care  PLAN: Colonoscopy in the [place] today   HPI: Michael [name] is a 46 y.o. male history of ESRD on HD, cerebral aneurysm s/p embolization on Brilinta, OSA on CPAP, HTN presents for colon cancer screening.    No prior colonoscopy or colon cancer screening.  No baseline GI symptoms except for intermittent constipation.   No known family history of colon cancer or polyps. No family history of uterine/endometrial cancer, pancreatic cancer or gastric/stomach cancer.  Please see Dr. Libby Maw outpatient consultation note from 02/23/2021 for full details.   Past Medical History:  Diagnosis Date   Gout    History of renal dialysis    M-W-F   HLD (hyperlipidemia)    Hypertension    Pneumonia    Polycystic kidney disease    Sleep apnea 07/29/2018   uses cpap nightly   Vitamin D deficiency 11/2018    Past Surgical History:  Procedure Laterality Date   AV FISTULA PLACEMENT Left 03/24/2020   Procedure: INSERTION OF LEFT UPPER EXTREMITY ARTERIOVENOUS (AV) GORE-TEX GRAFT;  Surgeon: [name], MD;  Location: [facility];  Service: Vascular;  Laterality: Left;  PERIPHERAL NERVE BLOCK   [place] Left 12/11/2019   Procedure: 1ST STAGE BASILIC TRANSPOSITION OF LEFT ARM;  Surgeon: Angelia Mould, MD;  Location: [facility];  Service: Vascular;  Laterality: Left;   DG AV DIALYSIS GRAFT DECLOT OR Left 05/17/2020   IR FLUORO GUIDE CV LINE RIGHT  12/09/2019   IR US GUIDE VASC ACCESS RIGHT  12/09/2019   LEFT HEART CATH AND CORONARY ANGIOGRAPHY N/A 12/09/2019   Procedure: LEFT HEART CATH AND CORONARY ANGIOGRAPHY;  Surgeon: Adrian Prows, MD;  Location: [facility];  Service: Cardiovascular;  Laterality:  N/A;    No current facility-administered medications for this encounter.    Allergies as of 03/02/2021   (No Known Allergies)    Family History  Problem Relation Age of Onset   Diabetes Mother    Ovarian cancer Mother    Bipolar disorder Father    Hypertension Father    Prostate cancer Father    Prostate cancer Paternal Grandfather    Polycystic kidney disease Neg Hx      Physical Exam: General:   Alert,  well-nourished, pleasant and cooperative in NAD Head:  Normocephalic and atraumatic. Eyes:  Sclera clear, no icterus.   Conjunctiva pink. Mouth:  No deformity or lesions.   Neck:  Supple; no masses or thyromegaly. Lungs:  Clear throughout to auscultation.   No wheezes. Heart:  Regular rate and rhythm; no murmurs. Abdomen:  Soft, non-tender, nondistended, normal bowel sounds, no rebound or guarding.  Msk:  Symmetrical. No boney deformities LAD: No inguinal or umbilical LAD Extremities:  No clubbing or edema. Neurologic:  Alert and  oriented x4;  grossly nonfocal Skin:  No obvious rash or bruise. Psych:  Alert and cooperative. Normal mood and affect.     Studies/Results: No results found.    Savaya Hakes L. Tarri Glenn, MD, MPH 04/06/2021, 7:35 AM

## 2021-04-06 NOTE — Telephone Encounter (Signed)
Scheduled appt per 1/19 referral. Spoke to pt who is aware of appt date and time. Pt is aware to arrive 15 mins prior to appt time.  

## 2021-04-06 NOTE — Op Note (Addendum)
[facility] Patient Name: Michael [name] Procedure Date: 04/06/2021 MRN: [id number] Attending MD: Thornton Park MD, MD Date of Birth: 06-26-1975 CSN: [id number] Age: 46 Admit Type: Outpatient Procedure:                Colonoscopy Indications:              Screening for colorectal malignant neoplasm, This                            is the patient's first colonoscopy Providers:                Thornton Park MD, MD, Jaci Carrel, RN, Janie                            Billups, Technician, Cletis Athens, Technician, Marla Roe, CRNA Referring MD:              Medicines:                Monitored Anesthesia Care Complications:            No immediate complications. Estimated blood loss:                            Minimal. Estimated Blood Loss:     Estimated blood loss was minimal. Procedure:                Pre-Anesthesia Assessment:                           - Prior to the procedure, a History and Physical                            was performed, and patient medications and                            allergies were reviewed. The patient's tolerance of                            previous anesthesia was also reviewed. The risks                            and benefits of the procedure and the sedation                            options and risks were discussed with the patient.                            All questions were answered, and informed consent                            was obtained. Prior Anticoagulants: The patient has                            taken Brilinta, last dose  was 5 days prior to                            procedure. ASA Grade Assessment: III - A patient                            with severe systemic disease. After reviewing the                            risks and benefits, the patient was deemed in                            satisfactory condition to undergo the procedure.                           After obtaining  informed consent, the colonoscope                            was passed under direct vision. Throughout the                            procedure, the patient's blood pressure, pulse, and                            oxygen saturations were monitored continuously. The                            CF-HQ190L ([id number]) Olympus colonoscope was                            introduced through the anus and advanced to the the                            cecum, identified by appendiceal orifice and                            ileocecal valve. The colonoscopy was performed with                            moderate difficulty due to inadequate bowel prep.                            The patient tolerated the procedure well. The                            quality of the bowel preparation was poor. The                            ileocecal valve, appendiceal orifice, and rectum                            were photographed. Scope In: 8:28:04 AM Scope Out: 8:57:03 AM Scope Withdrawal Time: 0 hours 26 minutes 33 seconds  Total Procedure Duration: 0 hours 28  minutes 59 seconds  Findings:      A 5 mm polyp was found in the cecum. The polyp was sessile. The polyp       was removed with a cold snare. Resection and retrieval were complete.       Estimated blood loss was minimal.      A 6 mm polyp was found in the proximal ascending colon. The polyp was       sessile. The polyp was removed with a cold snare. Resection and       retrieval were complete. Estimated blood loss was minimal.      A large, partially obstructing polyp was found in the sigmoid colon       located 36 cm from the anal verge. The polyp is multilobulated, at least       68mm, with overlying ulceration. The polyp was pedunculated with a       broad-based stalk. Multiple biopsies were taken with a cold forceps for       histology. Area was tattooed with an injection of 3 mL of Spot (carbon       black) at the base of the polyp and on the adjacent  wall.      A 10 mm polyp was found in the sigmoid colon. The polyp was       pedunculated. The polyp was removed with a cold snare. Resection and       retrieval were complete. Estimated blood loss was minimal.      14 sessile polyps were found in the sigmoid colon. The polyps were 2 to       8 mm in size. These polyps were removed with a cold snare. Resection and       retrieval were complete. Estimated blood loss was minimal.      Stool was found in the entire colon. Other polyps may be present and       were unable to be identified today due to the residual stool. Impression:               - Preparation of the colon was poor.                           - One 5 mm polyp in the cecum, removed with a cold                            snare. Resected and retrieved.                           - One 6 mm polyp in the proximal ascending colon,                            removed with a cold snare. Resected and retrieved.                           - One large polyp in the sigmoid colon. Biopsied.                            Tattooed.                           - One 10  mm polyp in the sigmoid colon, removed                            with a cold snare. Resected and retrieved.                           - Fourteen 2 to 8 mm polyps in the sigmoid colon,                            removed with a cold snare. Resected and retrieved.                           - Stool in the entire examined colon. Other polyps                            may be present and were unable to be identified                            today due to the residual stool. Moderate Sedation:      Not Applicable - Patient had care per Anesthesia. Recommendation:           - Patient has a contact number available for                            emergencies. The signs and symptoms of potential                            delayed complications were discussed with the                            patient. Return to normal activities tomorrow.                             Written discharge instructions were provided to the                            patient.                           - Resume previous diet.                           - Continue present medications.                           - Resume Brilinta at prior dose in 4 days.                           - Await pathology results. If the sigmoid polyp                            biopsies are benign, will review endoscopic  resection with an advanced endoscopist. If the                            biopsies show advanced features, will refer to                            surgery.                           - Emerging evidence supports eating a diet of                            fruits, vegetables, grains, calcium, and yogurt                            while reducing red meat and alcohol may reduce the                            risk of colon cancer.                           - Given the number of polyps removed today, I                            recommend that you consult with a genetic counselor.                           - Given these results, all first degree relatives                            (brothers, sisters, children, parents) should start                            colon cancer screening at age 72. Procedure Code(s):        --- Professional ---                           [phone], Colonoscopy, flexible; with removal of                            tumor(s), polyp(s), or other lesion(s) by snare                            technique                           45381, Colonoscopy, flexible; with directed                            submucosal injection(s), any substance                           62035, 59, Colonoscopy, flexible; with biopsy,  single or multiple Diagnosis Code(s):        --- Professional ---                           K63.5, Polyp of colon                           Z12.11, Encounter for screening for malignant                             neoplasm of colon CPT copyright 2019 [employer]. All rights reserved. The codes documented in this report are preliminary and upon coder review may  be revised to meet current compliance requirements. Thornton Park MD, MD 04/06/2021 9:15:24 AM This report has been signed electronically. Number of Addenda: 0

## 2021-04-06 NOTE — Transfer of Care (Signed)
Immediate Anesthesia Transfer of Care Note  Patient: Michael [name]  Procedure(s) Performed: COLONOSCOPY WITH PROPOFOL POLYPECTOMY SUBMUCOSAL TATTOO INJECTION BIOPSY  Patient Location: PACU  Anesthesia Type:MAC  Level of Consciousness: sedated  Airway & Oxygen Therapy: Patient Spontanous Breathing and Patient connected to face mask oxygen  Post-op Assessment: Report given to RN and Post -op Vital signs reviewed and stable  Post vital signs: Reviewed and stable  Last Vitals:  Vitals Value Taken Time  BP    Temp    Pulse 60 04/06/21 0903  Resp 20 04/06/21 0903  SpO2 100 % 04/06/21 0903  Vitals shown include unvalidated device data.  Last Pain:  Vitals:   04/06/21 0709  TempSrc: Temporal  PainSc: 0-No pain         Complications: No notable events documented.

## 2021-04-06 NOTE — Discharge Instructions (Addendum)
YOU HAD AN ENDOSCOPIC PROCEDURE TODAY: Refer to the procedure report and other information in the discharge instructions given to you for any specific questions about what was found during the examination. If this information does not answer your questions, please call [place] office at [phone] to clarify.   YOU SHOULD EXPECT: Some feelings of bloating in the abdomen. Passage of more gas than usual. Walking can help get rid of the air that was put into your GI tract during the procedure and reduce the bloating. If you had a lower endoscopy (such as a colonoscopy or flexible sigmoidoscopy) you may notice spotting of blood in your stool or on the toilet paper. Some abdominal soreness may be present for a day or two, also.  DIET: Your first meal following the procedure should be a light meal and then it is ok to progress to your normal diet. A half-sandwich or bowl of soup is an example of a good first meal. Heavy or fried foods are harder to digest and may make you feel nauseous or bloated. Drink plenty of fluids but you should avoid alcoholic beverages for 24 hours. If you had a esophageal dilation, please see attached instructions for diet.    ACTIVITY: Your care partner should take you home directly after the procedure. You should plan to take it easy, moving slowly for the rest of the day. You can resume normal activity the day after the procedure however YOU SHOULD NOT DRIVE, use power tools, machinery or perform tasks that involve climbing or major physical exertion for 24 hours (because of the sedation medicines used during the test).   SYMPTOMS TO REPORT IMMEDIATELY: A gastroenterologist can be reached at any hour. Please call [phone]  for any of the following symptoms:  Following lower endoscopy (colonoscopy, flexible sigmoidoscopy) Excessive amounts of blood in the stool  Significant tenderness, worsening of abdominal pains  Swelling of the abdomen that is new, acute  Fever of 100 or  higher  Following upper endoscopy (EGD, EUS, ERCP, esophageal dilation) Vomiting of blood or coffee ground material  New, significant abdominal pain  New, significant chest pain or pain under the shoulder blades  Painful or persistently difficult swallowing  New shortness of breath  Black, tarry-looking or red, bloody stools  FOLLOW UP:  If any biopsies were taken you will be contacted by phone or by letter within the next 1-3 weeks. Call [phone]  if you have not heard about the biopsies in 3 weeks.  Please also call with any specific questions about appointments or follow up tests. Hold your Brilinta for 4 more days. Resume Brilinta 04/10/21.

## 2021-04-06 NOTE — Anesthesia Postprocedure Evaluation (Signed)
Anesthesia Post Note  Patient: [name]  Procedure(s) Performed: COLONOSCOPY WITH PROPOFOL POLYPECTOMY SUBMUCOSAL TATTOO INJECTION BIOPSY     Patient location during evaluation: PACU Anesthesia Type: MAC Level of consciousness: awake and alert Pain management: pain level controlled Vital Signs Assessment: post-procedure vital signs reviewed and stable Respiratory status: spontaneous breathing and respiratory function stable Cardiovascular status: stable Postop Assessment: no apparent nausea or vomiting Anesthetic complications: no   No notable events documented.  Last Vitals:  Vitals:   04/06/21 0913 04/06/21 0923  BP: (!) 119/51 118/67  Pulse: 64 68  Resp: 20 19  Temp:    SpO2: 99% 99%    Last Pain:  Vitals:   04/06/21 0923  TempSrc:   PainSc: 0-No pain                 Akram Kissick DANIEL

## 2021-04-07 ENCOUNTER — Encounter (HOSPITAL_COMMUNITY): Payer: Self-pay | Admitting: Gastroenterology

## 2021-04-07 DIAGNOSIS — R209 Unspecified disturbances of skin sensation: Secondary | ICD-10-CM | POA: Diagnosis not present

## 2021-04-07 DIAGNOSIS — D509 Iron deficiency anemia, unspecified: Secondary | ICD-10-CM | POA: Diagnosis not present

## 2021-04-07 DIAGNOSIS — N186 End stage renal disease: Secondary | ICD-10-CM | POA: Diagnosis not present

## 2021-04-07 DIAGNOSIS — N2581 Secondary hyperparathyroidism of renal origin: Secondary | ICD-10-CM | POA: Diagnosis not present

## 2021-04-07 DIAGNOSIS — Z992 Dependence on renal dialysis: Secondary | ICD-10-CM | POA: Diagnosis not present

## 2021-04-07 DIAGNOSIS — D631 Anemia in chronic kidney disease: Secondary | ICD-10-CM | POA: Diagnosis not present

## 2021-04-07 LAB — SURGICAL PATHOLOGY

## 2021-04-10 DIAGNOSIS — R209 Unspecified disturbances of skin sensation: Secondary | ICD-10-CM | POA: Diagnosis not present

## 2021-04-10 DIAGNOSIS — Z992 Dependence on renal dialysis: Secondary | ICD-10-CM | POA: Diagnosis not present

## 2021-04-10 DIAGNOSIS — D509 Iron deficiency anemia, unspecified: Secondary | ICD-10-CM | POA: Diagnosis not present

## 2021-04-10 DIAGNOSIS — D631 Anemia in chronic kidney disease: Secondary | ICD-10-CM | POA: Diagnosis not present

## 2021-04-10 DIAGNOSIS — N2581 Secondary hyperparathyroidism of renal origin: Secondary | ICD-10-CM | POA: Diagnosis not present

## 2021-04-10 DIAGNOSIS — N186 End stage renal disease: Secondary | ICD-10-CM | POA: Diagnosis not present

## 2021-04-12 DIAGNOSIS — D631 Anemia in chronic kidney disease: Secondary | ICD-10-CM | POA: Diagnosis not present

## 2021-04-12 DIAGNOSIS — N2581 Secondary hyperparathyroidism of renal origin: Secondary | ICD-10-CM | POA: Diagnosis not present

## 2021-04-12 DIAGNOSIS — Z992 Dependence on renal dialysis: Secondary | ICD-10-CM | POA: Diagnosis not present

## 2021-04-12 DIAGNOSIS — N186 End stage renal disease: Secondary | ICD-10-CM | POA: Diagnosis not present

## 2021-04-12 DIAGNOSIS — R209 Unspecified disturbances of skin sensation: Secondary | ICD-10-CM | POA: Diagnosis not present

## 2021-04-12 DIAGNOSIS — D509 Iron deficiency anemia, unspecified: Secondary | ICD-10-CM | POA: Diagnosis not present

## 2021-04-13 DIAGNOSIS — G4733 Obstructive sleep apnea (adult) (pediatric): Secondary | ICD-10-CM | POA: Diagnosis not present

## 2021-04-14 ENCOUNTER — Other Ambulatory Visit: Payer: Self-pay

## 2021-04-14 ENCOUNTER — Telehealth: Payer: Self-pay

## 2021-04-14 DIAGNOSIS — D125 Benign neoplasm of sigmoid colon: Secondary | ICD-10-CM

## 2021-04-14 DIAGNOSIS — N186 End stage renal disease: Secondary | ICD-10-CM

## 2021-04-14 DIAGNOSIS — D126 Benign neoplasm of colon, unspecified: Secondary | ICD-10-CM

## 2021-04-14 DIAGNOSIS — I10 Essential (primary) hypertension: Secondary | ICD-10-CM

## 2021-04-14 DIAGNOSIS — D509 Iron deficiency anemia, unspecified: Secondary | ICD-10-CM | POA: Diagnosis not present

## 2021-04-14 DIAGNOSIS — Z9189 Other specified personal risk factors, not elsewhere classified: Secondary | ICD-10-CM

## 2021-04-14 DIAGNOSIS — Z992 Dependence on renal dialysis: Secondary | ICD-10-CM | POA: Diagnosis not present

## 2021-04-14 DIAGNOSIS — R209 Unspecified disturbances of skin sensation: Secondary | ICD-10-CM | POA: Diagnosis not present

## 2021-04-14 DIAGNOSIS — N2581 Secondary hyperparathyroidism of renal origin: Secondary | ICD-10-CM | POA: Diagnosis not present

## 2021-04-14 DIAGNOSIS — D631 Anemia in chronic kidney disease: Secondary | ICD-10-CM | POA: Diagnosis not present

## 2021-04-14 NOTE — Telephone Encounter (Signed)
Pre-operative Risk Assessment     Michael [name] 02-24-1976 [id number]  Procedure: Colonoscopy Anesthesia type:  MAC Procedure Date: 05/29/21 Provider: Dr. Rush Landmark  Type of Clearance needed: Pharmacy  Medication(s) needing held: Brilinta   Length of time for medication to be held: 5 days  Please review request and advise by either responding to this message or by sending your response to the fax # provided below.  Thank you,  [facility]  Phone: [phone] Fax: [phone] ATTENTION: Hoda Hon, LPN

## 2021-04-14 NOTE — Addendum Note (Signed)
Addended by: Hardie Pulley, Arlanda Shiplett J on: 04/14/2021 10:34 AM   Modules accepted: Orders

## 2021-04-17 ENCOUNTER — Ambulatory Visit (HOSPITAL_COMMUNITY): Payer: Medicare Other

## 2021-04-17 DIAGNOSIS — R209 Unspecified disturbances of skin sensation: Secondary | ICD-10-CM | POA: Diagnosis not present

## 2021-04-17 DIAGNOSIS — N186 End stage renal disease: Secondary | ICD-10-CM | POA: Diagnosis not present

## 2021-04-17 DIAGNOSIS — D509 Iron deficiency anemia, unspecified: Secondary | ICD-10-CM | POA: Diagnosis not present

## 2021-04-17 DIAGNOSIS — D631 Anemia in chronic kidney disease: Secondary | ICD-10-CM | POA: Diagnosis not present

## 2021-04-17 DIAGNOSIS — Z992 Dependence on renal dialysis: Secondary | ICD-10-CM | POA: Diagnosis not present

## 2021-04-17 DIAGNOSIS — N2581 Secondary hyperparathyroidism of renal origin: Secondary | ICD-10-CM | POA: Diagnosis not present

## 2021-04-18 DIAGNOSIS — N186 End stage renal disease: Secondary | ICD-10-CM | POA: Diagnosis not present

## 2021-04-18 DIAGNOSIS — Z992 Dependence on renal dialysis: Secondary | ICD-10-CM | POA: Diagnosis not present

## 2021-04-20 DIAGNOSIS — Z992 Dependence on renal dialysis: Secondary | ICD-10-CM | POA: Diagnosis not present

## 2021-04-20 DIAGNOSIS — R197 Diarrhea, unspecified: Secondary | ICD-10-CM | POA: Diagnosis not present

## 2021-04-20 DIAGNOSIS — D509 Iron deficiency anemia, unspecified: Secondary | ICD-10-CM | POA: Diagnosis not present

## 2021-04-20 DIAGNOSIS — L299 Pruritus, unspecified: Secondary | ICD-10-CM | POA: Diagnosis not present

## 2021-04-20 DIAGNOSIS — N186 End stage renal disease: Secondary | ICD-10-CM | POA: Diagnosis not present

## 2021-04-20 DIAGNOSIS — D631 Anemia in chronic kidney disease: Secondary | ICD-10-CM | POA: Diagnosis not present

## 2021-04-20 DIAGNOSIS — N2581 Secondary hyperparathyroidism of renal origin: Secondary | ICD-10-CM | POA: Diagnosis not present

## 2021-04-20 DIAGNOSIS — R209 Unspecified disturbances of skin sensation: Secondary | ICD-10-CM | POA: Diagnosis not present

## 2021-04-21 ENCOUNTER — Encounter (HOSPITAL_COMMUNITY): Payer: Self-pay | Admitting: Gastroenterology

## 2021-04-21 DIAGNOSIS — N186 End stage renal disease: Secondary | ICD-10-CM | POA: Diagnosis not present

## 2021-04-21 DIAGNOSIS — D509 Iron deficiency anemia, unspecified: Secondary | ICD-10-CM | POA: Diagnosis not present

## 2021-04-21 DIAGNOSIS — L299 Pruritus, unspecified: Secondary | ICD-10-CM | POA: Diagnosis not present

## 2021-04-21 DIAGNOSIS — Z992 Dependence on renal dialysis: Secondary | ICD-10-CM | POA: Diagnosis not present

## 2021-04-21 DIAGNOSIS — R209 Unspecified disturbances of skin sensation: Secondary | ICD-10-CM | POA: Diagnosis not present

## 2021-04-21 DIAGNOSIS — D631 Anemia in chronic kidney disease: Secondary | ICD-10-CM | POA: Diagnosis not present

## 2021-04-21 DIAGNOSIS — N2581 Secondary hyperparathyroidism of renal origin: Secondary | ICD-10-CM | POA: Diagnosis not present

## 2021-04-21 DIAGNOSIS — R197 Diarrhea, unspecified: Secondary | ICD-10-CM | POA: Diagnosis not present

## 2021-04-21 NOTE — Telephone Encounter (Signed)
Received returned call from Dr. Evie Lacks nurse. Asked that this clearance be re-faxed to an alternate # [phone]. Clearance has been re-faxed with confirmation received. See below:  Chart Review Routing History Since 04/22/2020 [facility] Full Routing History)  Recipients Sent On Sent By Routed Reports   Dr. Bridgette Habermann   04/21/2021 11:11 AM Aleatha Borer, LPN Telephone on 1/60/7371 with Aleatha Borer, LPN      Cover Page Message : Please review and provide response to [phone]. Thank you

## 2021-04-21 NOTE — Telephone Encounter (Signed)
Called Dr. Evie Lacks office to f/u on the status of this pt clearance/Brilinta hold. Left detailed VM with nurse asking for a returned call with status update.

## 2021-04-24 DIAGNOSIS — N186 End stage renal disease: Secondary | ICD-10-CM | POA: Diagnosis not present

## 2021-04-24 DIAGNOSIS — L299 Pruritus, unspecified: Secondary | ICD-10-CM | POA: Diagnosis not present

## 2021-04-24 DIAGNOSIS — Z992 Dependence on renal dialysis: Secondary | ICD-10-CM | POA: Diagnosis not present

## 2021-04-24 DIAGNOSIS — D509 Iron deficiency anemia, unspecified: Secondary | ICD-10-CM | POA: Diagnosis not present

## 2021-04-24 DIAGNOSIS — D631 Anemia in chronic kidney disease: Secondary | ICD-10-CM | POA: Diagnosis not present

## 2021-04-24 DIAGNOSIS — N2581 Secondary hyperparathyroidism of renal origin: Secondary | ICD-10-CM | POA: Diagnosis not present

## 2021-04-24 DIAGNOSIS — R209 Unspecified disturbances of skin sensation: Secondary | ICD-10-CM | POA: Diagnosis not present

## 2021-04-24 DIAGNOSIS — R197 Diarrhea, unspecified: Secondary | ICD-10-CM | POA: Diagnosis not present

## 2021-04-25 NOTE — Telephone Encounter (Signed)
Called Dr. Evie Lacks office to f/u on the status of clearance. LVM with nurse asking to either fax response to [phone] or to call me with a status update.

## 2021-04-26 DIAGNOSIS — Z992 Dependence on renal dialysis: Secondary | ICD-10-CM | POA: Diagnosis not present

## 2021-04-26 DIAGNOSIS — N2581 Secondary hyperparathyroidism of renal origin: Secondary | ICD-10-CM | POA: Diagnosis not present

## 2021-04-26 DIAGNOSIS — N186 End stage renal disease: Secondary | ICD-10-CM | POA: Diagnosis not present

## 2021-04-26 DIAGNOSIS — D509 Iron deficiency anemia, unspecified: Secondary | ICD-10-CM | POA: Diagnosis not present

## 2021-04-26 DIAGNOSIS — D631 Anemia in chronic kidney disease: Secondary | ICD-10-CM | POA: Diagnosis not present

## 2021-04-26 DIAGNOSIS — R209 Unspecified disturbances of skin sensation: Secondary | ICD-10-CM | POA: Diagnosis not present

## 2021-04-26 DIAGNOSIS — L299 Pruritus, unspecified: Secondary | ICD-10-CM | POA: Diagnosis not present

## 2021-04-26 DIAGNOSIS — R197 Diarrhea, unspecified: Secondary | ICD-10-CM | POA: Diagnosis not present

## 2021-04-26 NOTE — Telephone Encounter (Signed)
Received call from [facility], nurse for Dr. Bridgette Habermann. States clearance has been received but Dr. Bridgette Habermann has been out of the office. Response should be received by the end of the week.

## 2021-04-26 NOTE — Telephone Encounter (Signed)
Thanks for update. GM 

## 2021-04-26 NOTE — Telephone Encounter (Signed)
Received the following faxed clearance from Dr. Evie Lacks office:  - Pt can hold Brilinta for 5 days for his upcoming procedure. I recommend that the pt continue taking his 81 mg aspirin while he is holding the Brilinta. Please call [phone] for questions or concerns.  Routing this information to Dr. Rush Landmark and Dr. Lorenso Courier for their review. This clearance has also been labeled and placed in scan file for scanning purposes and for our future reference.  Following message sent to the pt via My Chart:  Hi Michael [name],  Dr. Bridgette Habermann has provided the following recommendation about holding your Brilinta:  - Hold Brilinta 5 days prior to your upcoming procedure.  - You may continue the 81 mg aspirin while you are holding the Brilinta - Please call [phone] for questions or concerns.  Please remember that your LAST DOSE of BRILINTA will be on March 8th. Dr. Rush Landmark will advise you when to resume your Brilinta.  Thank you

## 2021-04-27 ENCOUNTER — Ambulatory Visit (HOSPITAL_COMMUNITY)
Admission: RE | Admit: 2021-04-27 | Discharge: 2021-04-27 | Disposition: A | Discharge status: Home / Self Care | Payer: Medicare Other | Source: Ambulatory Visit | Attending: Gastroenterology | Admitting: Gastroenterology

## 2021-04-27 ENCOUNTER — Other Ambulatory Visit: Payer: Self-pay

## 2021-04-27 DIAGNOSIS — Z9189 Other specified personal risk factors, not elsewhere classified: Secondary | ICD-10-CM | POA: Diagnosis not present

## 2021-04-27 DIAGNOSIS — D126 Benign neoplasm of colon, unspecified: Secondary | ICD-10-CM | POA: Insufficient documentation

## 2021-04-27 DIAGNOSIS — D125 Benign neoplasm of sigmoid colon: Secondary | ICD-10-CM | POA: Insufficient documentation

## 2021-04-27 DIAGNOSIS — K6389 Other specified diseases of intestine: Secondary | ICD-10-CM | POA: Diagnosis not present

## 2021-04-27 DIAGNOSIS — C189 Malignant neoplasm of colon, unspecified: Secondary | ICD-10-CM | POA: Diagnosis not present

## 2021-04-27 DIAGNOSIS — N281 Cyst of kidney, acquired: Secondary | ICD-10-CM | POA: Diagnosis not present

## 2021-04-27 DIAGNOSIS — K7689 Other specified diseases of liver: Secondary | ICD-10-CM | POA: Diagnosis not present

## 2021-04-27 MED ORDER — IOHEXOL 300 MG/ML  SOLN
100.0000 mL | Freq: Once | INTRAMUSCULAR | Status: AC | PRN
  Administered 2021-04-27: 100 mL via INTRAVENOUS

## 2021-04-27 MED ORDER — IOHEXOL 9 MG/ML PO SOLN: 500.0000 mL | ORAL | Status: AC

## 2021-04-27 MED ORDER — SODIUM CHLORIDE (PF) 0.9 % IJ SOLN
INTRAMUSCULAR | Status: AC
  Filled 2021-04-27: qty 50

## 2021-04-27 MED ORDER — IOHEXOL 9 MG/ML PO SOLN
ORAL | Status: AC
  Filled 2021-04-27: qty 1000

## 2021-04-28 DIAGNOSIS — L299 Pruritus, unspecified: Secondary | ICD-10-CM | POA: Diagnosis not present

## 2021-04-28 DIAGNOSIS — R197 Diarrhea, unspecified: Secondary | ICD-10-CM | POA: Diagnosis not present

## 2021-04-28 DIAGNOSIS — R209 Unspecified disturbances of skin sensation: Secondary | ICD-10-CM | POA: Diagnosis not present

## 2021-04-28 DIAGNOSIS — N2581 Secondary hyperparathyroidism of renal origin: Secondary | ICD-10-CM | POA: Diagnosis not present

## 2021-04-28 DIAGNOSIS — N186 End stage renal disease: Secondary | ICD-10-CM | POA: Diagnosis not present

## 2021-04-28 DIAGNOSIS — D631 Anemia in chronic kidney disease: Secondary | ICD-10-CM | POA: Diagnosis not present

## 2021-04-28 DIAGNOSIS — D509 Iron deficiency anemia, unspecified: Secondary | ICD-10-CM | POA: Diagnosis not present

## 2021-04-28 DIAGNOSIS — Z992 Dependence on renal dialysis: Secondary | ICD-10-CM | POA: Diagnosis not present

## 2021-05-01 DIAGNOSIS — D509 Iron deficiency anemia, unspecified: Secondary | ICD-10-CM | POA: Diagnosis not present

## 2021-05-01 DIAGNOSIS — N2581 Secondary hyperparathyroidism of renal origin: Secondary | ICD-10-CM | POA: Diagnosis not present

## 2021-05-01 DIAGNOSIS — Z992 Dependence on renal dialysis: Secondary | ICD-10-CM | POA: Diagnosis not present

## 2021-05-01 DIAGNOSIS — R197 Diarrhea, unspecified: Secondary | ICD-10-CM | POA: Diagnosis not present

## 2021-05-01 DIAGNOSIS — L299 Pruritus, unspecified: Secondary | ICD-10-CM | POA: Diagnosis not present

## 2021-05-01 DIAGNOSIS — N186 End stage renal disease: Secondary | ICD-10-CM | POA: Diagnosis not present

## 2021-05-01 DIAGNOSIS — D631 Anemia in chronic kidney disease: Secondary | ICD-10-CM | POA: Diagnosis not present

## 2021-05-01 DIAGNOSIS — R209 Unspecified disturbances of skin sensation: Secondary | ICD-10-CM | POA: Diagnosis not present

## 2021-05-01 NOTE — Progress Notes (Unsigned)
REFERRING PROVIDER: [name], FNP [address] [place],  [place] [address]  PRIMARY PROVIDER:  [name], FNP  PRIMARY REASON FOR VISIT:  No diagnosis found.   HISTORY OF PRESENT ILLNESS:   Michael [name], a 46 y.o. male, was seen for a [place] cancer genetics consultation at the request of Dr. Smith Robert due to a {Personal/family:20331} history of {cancer/polyps}.  Michael [name] presents to clinic today to discuss the possibility of a hereditary predisposition to cancer, to discuss genetic testing, and to further clarify his future cancer risks, as well as potential cancer risks for family members.    *** Michael [name] is a 46 y.o. male with no personal history of cancer. He has a personal history of 18 colon polyps (6 tubular adenomas) noted on a colonoscopy in 03/2021.  CANCER HISTORY:  Oncology History   No history exists.    Past Medical History:  Diagnosis Date   Gout    History of renal dialysis    M-W-F   HLD (hyperlipidemia)    Hypertension    Pneumonia    Polycystic kidney disease    Sleep apnea 07/29/2018   uses cpap nightly   Vitamin D deficiency 11/2018    Past Surgical History:  Procedure Laterality Date   AV FISTULA PLACEMENT Left 03/24/2020   Procedure: INSERTION OF LEFT UPPER EXTREMITY ARTERIOVENOUS (AV) GORE-TEX GRAFT;  Surgeon: [name], MD;  Location: [facility];  Service: Vascular;  Laterality: Left;  PERIPHERAL NERVE BLOCK   [place] Left 12/11/2019   Procedure: 1ST STAGE BASILIC TRANSPOSITION OF LEFT ARM;  Surgeon: Angelia Mould, MD;  Location: [facility];  Service: Vascular;  Laterality: Left;   BIOPSY  04/06/2021   Procedure: BIOPSY;  Surgeon: Thornton Park, MD;  Location: [facility];  Service: Gastroenterology;;   COLONOSCOPY WITH PROPOFOL N/A 04/06/2021   Procedure: COLONOSCOPY WITH PROPOFOL;  Surgeon: Thornton Park, MD;  Location: [facility];  Service: Gastroenterology;  Laterality: N/A;   DG  AV DIALYSIS GRAFT DECLOT OR Left 05/17/2020   IR FLUORO GUIDE CV LINE RIGHT  12/09/2019   IR US GUIDE VASC ACCESS RIGHT  12/09/2019   LEFT HEART CATH AND CORONARY ANGIOGRAPHY N/A 12/09/2019   Procedure: LEFT HEART CATH AND CORONARY ANGIOGRAPHY;  Surgeon: Adrian Prows, MD;  Location: [facility];  Service: Cardiovascular;  Laterality: N/A;   POLYPECTOMY  04/06/2021   Procedure: POLYPECTOMY;  Surgeon: Thornton Park, MD;  Location: [facility];  Service: Gastroenterology;;   SUBMUCOSAL TATTOO INJECTION  04/06/2021   Procedure: SUBMUCOSAL TATTOO INJECTION;  Surgeon: Thornton Park, MD;  Location: [facility];  Service: Gastroenterology;;    Social History   Socioeconomic History   Marital status: Single    Spouse name: Not on file   Number of children: 1   Years of education: Not on file   Highest education level: Not on file  Occupational History   Occupation: [employer]   Occupation: disabled  Tobacco Use   Smoking status: Former    Packs/day: 0.20    Years: 10.00    Pack years: 2.00    Types: Cigarettes    Quit date: 2015    Years since quitting: 8.1   Smokeless tobacco: Never  Vaping Use   Vaping Use: Never used  Substance and Sexual Activity   Alcohol use: Yes    Comment: occasional wine   Drug use: No   Sexual activity: Yes  Other Topics Concern   Not  on file  Social History Narrative   Not on file   Social Determinants of Health   Financial Resource Strain: High Risk   Difficulty of Paying Living Expenses: Very hard  Food Insecurity: No Food Insecurity   Worried About Charity fundraiser in the Last Year: Never true   Ran Out of Food in the Last Year: Never true  Transportation Needs: No Transportation Needs   Lack of Transportation (Medical): No   Lack of Transportation (Non-Medical): No  Physical Activity: Insufficiently Active   Days of Exercise per Week: 3 days   Minutes of Exercise per Session: 30 min  Stress: Stress Concern Present    Feeling of Stress : To some extent  Social Connections: Moderately Integrated   Frequency of Communication with Friends and Family: More than three times a week   Frequency of Social Gatherings with Friends and Family: Once a week   Attends Religious Services: 1 to 4 times per year   Active Member of [employer] or Organizations: Yes   Attends Archivist Meetings: 1 to 4 times per year   Marital Status: Never married     FAMILY HISTORY:  We obtained a detailed, 4-generation family history.  Significant diagnoses are listed below: Family History  Problem Relation Age of Onset   Diabetes Mother    Ovarian cancer Mother    Bipolar disorder Father    Hypertension Father    Prostate cancer Father    Prostate cancer Paternal Grandfather    Polycystic kidney disease Neg Hx     Michael [name] is {aware/unaware} of previous family history of genetic testing for hereditary cancer risks. Patient's maternal ancestors are of *** descent, and paternal ancestors are of *** descent. There {IS NO:12509} reported Ashkenazi Jewish ancestry. There {IS NO:12509} known consanguinity.  GENETIC COUNSELING ASSESSMENT: Michael [name] is a 46 y.o. male with a {Personal/family:20331} history of {cancer/polyps} which is somewhat suggestive of a {DISEASE} and predisposition to cancer given ***. We, therefore, discussed and recommended the following at today's visit.   DISCUSSION: We discussed that 5 - 10% of cancer is hereditary, with most cases of hereditary *** cancer associated with ***.  There are other genes that can be associated with hereditary *** cancer syndromes.  We discussed that testing is beneficial for several reasons, including knowing about other cancer risks, identifying potential screening and risk-reduction options that may be appropriate, and to understanding if other family members could be at risk for cancer and allowing them to undergo genetic testing.  We reviewed the characteristics, features  and inheritance patterns of hereditary cancer syndromes. We also discussed genetic testing, including the appropriate family members to test, the process of testing, insurance coverage and turn-around-time for results. We discussed the implications of a negative, positive, carrier and/or variant of uncertain significant result. We discussed that negative results would be uninformative given that Michael [name] does not have a personal history of cancer. We recommended Michael [name] pursue genetic testing for a panel that contains genes associated with ***.  Michael [name] was offered a common hereditary cancer panel (47 genes) and an expanded pan-cancer panel (84 genes). Michael [name] was informed of the benefits and limitations of each panel, including that expanded pan-cancer panels contain several genes that do not have clear management guidelines at this point in time.  We also discussed that as the number of genes included on a panel increases, the chances of variants of uncertain significance increases.  After considering the benefits and  limitations of each gene panel, Michael [name] elected to have an *** through ***.   Based on Michael [name] {Personal/family:20331} history of cancer, he meets medical criteria for genetic testing. Despite that he meets criteria, he may still have an out of pocket cost. We discussed that if his out of pocket cost for testing is over $100, the laboratory will call and confirm whether he wants to proceed with testing.  If the out of pocket cost of testing is less than $100 he will be billed by the genetic testing laboratory.   ***We reviewed the characteristics, features and inheritance patterns of hereditary cancer syndromes. We also discussed genetic testing, including the appropriate family members to test, the process of testing, insurance coverage and turn-around-time for results. We discussed the implications of a negative, positive and/or variant of uncertain significant result. In  order to get genetic test results in a timely manner so that Michael [name] can use these genetic test results for surgical decisions, we recommended Michael [name] pursue genetic testing for the ***. Once complete, we recommend Michael [name] pursue reflex genetic testing to the *** gene panel.   Based on Michael [name] {Personal/family:20331} history of cancer, he meets medical criteria for genetic testing. Despite that he meets criteria, he may still have an out of pocket cost.   ***We discussed with Michael [name] that the {Personal/family:20331} history does not meet insurance or NCCN criteria for genetic testing and, therefore, is not highly consistent with a familial hereditary cancer syndrome.  We feel he is at low risk to harbor a gene mutation associated with such a condition. Thus, we did not recommend any genetic testing, at this time, and recommended Michael [name] continue to follow the cancer screening guidelines given by his primary healthcare provider.  We discussed that some people do not want to undergo genetic testing due to fear of genetic discrimination.  A federal law called the Genetic Information Non-Discrimination Act (GINA) of 2008 helps protect individuals against genetic discrimination based on their genetic test results.  It impacts both health insurance and employment.  With health insurance, it protects against increased premiums, being kicked off insurance or being forced to take a test in order to be insured.  For employment it protects against hiring, firing and promoting decisions based on genetic test results.  GINA does not apply to those in the [employer], those who work for companies with less than 15 employees, and new life insurance or long-term disability insurance policies.  Health status due to a cancer diagnosis is not protected under GINA.  PLAN: After considering the risks, benefits, and limitations, Michael [name] provided informed consent to pursue genetic testing and the blood sample  was sent to {Lab} Laboratories for analysis of the {test}. Results should be available within approximately {TAT TIME} weeks' time, at which point they will be disclosed by telephone to Mr. Rallis, as will any additional recommendations warranted by these results. Mr. Laski will receive a summary of his genetic counseling visit and a copy of his results once available. This information will also be available in Epic.   *** Despite our recommendation, Mr. Hoos did not wish to pursue genetic testing at today's visit. We understand this decision and remain available to coordinate genetic testing at any time in the future. We, therefore, recommend Mr. Omlor continue to follow the cancer screening guidelines given by his primary healthcare provider.  ***Based on Mr. Postema family history, we recommended his ***, who was diagnosed with *** at  age ***, have genetic counseling and testing. Mr. Mathieson will let us know if we can be of any assistance in coordinating genetic counseling and/or testing for this family member.   Lastly, we encouraged Mr. Hoogendoorn to remain in contact with cancer genetics annually so that we can continuously update the family history and inform him of any changes in cancer genetics and testing that may be of benefit for this family.   Mr. Genet questions were answered to his satisfaction today. Our contact information was provided should additional questions or concerns arise. Thank you for the referral and allowing [place] to share in the care of your patient.   Lucille Passy, MS, [facility] Genetic Counselor [email or web address] ([phone]  The patient was seen for a total of *** minutes in face-to-face genetic counseling.  ***The patient brought ***.  ***The patient was seen alone.  Drs. Lindi Adie and/or Burr Medico were available to discuss this case as needed.  _______________________________________________________________________ For Office Staff:  Number of people  involved in session: *** Was an Intern/ student involved with case: {YES/NO:63}

## 2021-05-02 ENCOUNTER — Inpatient Hospital Stay: Payer: Medicare Other | Attending: Genetic Counselor | Admitting: Genetic Counselor

## 2021-05-02 ENCOUNTER — Inpatient Hospital Stay: Payer: Medicare Other

## 2021-05-02 DIAGNOSIS — D125 Benign neoplasm of sigmoid colon: Secondary | ICD-10-CM | POA: Diagnosis not present

## 2021-05-03 DIAGNOSIS — D631 Anemia in chronic kidney disease: Secondary | ICD-10-CM | POA: Diagnosis not present

## 2021-05-03 DIAGNOSIS — N2581 Secondary hyperparathyroidism of renal origin: Secondary | ICD-10-CM | POA: Diagnosis not present

## 2021-05-03 DIAGNOSIS — Z992 Dependence on renal dialysis: Secondary | ICD-10-CM | POA: Diagnosis not present

## 2021-05-03 DIAGNOSIS — D509 Iron deficiency anemia, unspecified: Secondary | ICD-10-CM | POA: Diagnosis not present

## 2021-05-03 DIAGNOSIS — N186 End stage renal disease: Secondary | ICD-10-CM | POA: Diagnosis not present

## 2021-05-03 DIAGNOSIS — L299 Pruritus, unspecified: Secondary | ICD-10-CM | POA: Diagnosis not present

## 2021-05-03 DIAGNOSIS — R209 Unspecified disturbances of skin sensation: Secondary | ICD-10-CM | POA: Diagnosis not present

## 2021-05-03 DIAGNOSIS — R197 Diarrhea, unspecified: Secondary | ICD-10-CM | POA: Diagnosis not present

## 2021-05-04 ENCOUNTER — Encounter: Payer: Self-pay | Admitting: Gastroenterology

## 2021-05-04 NOTE — Telephone Encounter (Signed)
Thank you  for update

## 2021-05-04 NOTE — Telephone Encounter (Signed)
Following received from Dr. Rush Landmark:  Mansouraty, Telford Nab., MD  Timothy Lasso, RN; Aleatha Borer, LPN; Thornton Park, MD; Leighton Ruff, MD [name],  I received a consultation note from Dr. Marcello Moores about this patient.  She saw the patient for consideration of surgical resection.  From the note, it suggested that the patient was unaware about a repeat colonoscopy attempt for potential resection of the sigmoid polyp.  Can either of you please reach out to the patient and check to ensure that he has an understanding of our plan for an attempted advanced resection.  I am happy to meet with the patient and talk with him if he would like to do that before the procedure (if necessary he can be scheduled for a 3:50 PM slot with me in clinic or a held slot).  Certainly we will be happy to talk with him at the time of procedure though it is an increased risk colonoscopy pretty significantly so it is up to him if he would like to meet or not since he is already met the colorectal surgeons.  Thanks.  GM   LVM asking pt to return my call. Also sent pt My Chart message (attached to POT INCLUDING Colonoscopy date/time/location/prep) advising about my call and asking that he return my call.

## 2021-05-04 NOTE — Telephone Encounter (Signed)
SECOND ATTEMPT:  Spoke directly with pt re: date/time/location of his colonoscopy with Dr. Rush Landmark. Also reviewed in detail about his prep instructions. Reminded that according to our conversation and upon his request, this very same information was sent via My Chart. Pt confirmed he remembered our conversation and admitted he has not logged in to My Chart. Review of pt chart indicated he last logged in successfully to My Chart 05/01/21 @ 956am. Pt confirmed he remembered logging in but stated he had "technical difficulties". Provided pt with contact # to My Chart IT to help him resolve his "technical difficulties". Also asked that they pt stop by the SECOND FLOOR of our office to pick up written prep instructions for him to review until his "technical difficulties" with My Chart have been resolved. Confirmed he will stop by our office by the end of the week to pick up his instructions. Pt was able to successfully read back all information provided verbally today. Advised we remain always available should he have any questions or concerns about his procedure AND if he needs to reschedule. Routing this message to Dr. Rush Landmark to make him aware of my conversation with pt.

## 2021-05-05 DIAGNOSIS — N186 End stage renal disease: Secondary | ICD-10-CM | POA: Diagnosis not present

## 2021-05-05 DIAGNOSIS — Z992 Dependence on renal dialysis: Secondary | ICD-10-CM | POA: Diagnosis not present

## 2021-05-05 DIAGNOSIS — D631 Anemia in chronic kidney disease: Secondary | ICD-10-CM | POA: Diagnosis not present

## 2021-05-05 DIAGNOSIS — R209 Unspecified disturbances of skin sensation: Secondary | ICD-10-CM | POA: Diagnosis not present

## 2021-05-05 DIAGNOSIS — R197 Diarrhea, unspecified: Secondary | ICD-10-CM | POA: Diagnosis not present

## 2021-05-05 DIAGNOSIS — N2581 Secondary hyperparathyroidism of renal origin: Secondary | ICD-10-CM | POA: Diagnosis not present

## 2021-05-05 DIAGNOSIS — L299 Pruritus, unspecified: Secondary | ICD-10-CM | POA: Diagnosis not present

## 2021-05-05 DIAGNOSIS — D509 Iron deficiency anemia, unspecified: Secondary | ICD-10-CM | POA: Diagnosis not present

## 2021-05-08 ENCOUNTER — Encounter: Payer: Self-pay | Admitting: Gastroenterology

## 2021-05-08 DIAGNOSIS — L299 Pruritus, unspecified: Secondary | ICD-10-CM | POA: Diagnosis not present

## 2021-05-08 DIAGNOSIS — N2581 Secondary hyperparathyroidism of renal origin: Secondary | ICD-10-CM | POA: Diagnosis not present

## 2021-05-08 DIAGNOSIS — N186 End stage renal disease: Secondary | ICD-10-CM | POA: Diagnosis not present

## 2021-05-08 DIAGNOSIS — Z992 Dependence on renal dialysis: Secondary | ICD-10-CM | POA: Diagnosis not present

## 2021-05-08 DIAGNOSIS — D631 Anemia in chronic kidney disease: Secondary | ICD-10-CM | POA: Diagnosis not present

## 2021-05-08 DIAGNOSIS — D509 Iron deficiency anemia, unspecified: Secondary | ICD-10-CM | POA: Diagnosis not present

## 2021-05-08 DIAGNOSIS — R197 Diarrhea, unspecified: Secondary | ICD-10-CM | POA: Diagnosis not present

## 2021-05-08 DIAGNOSIS — R209 Unspecified disturbances of skin sensation: Secondary | ICD-10-CM | POA: Diagnosis not present

## 2021-05-09 ENCOUNTER — Ambulatory Visit: Payer: Self-pay | Admitting: Family Medicine

## 2021-05-10 ENCOUNTER — Ambulatory Visit: Payer: Self-pay | Admitting: Nurse Practitioner

## 2021-05-10 DIAGNOSIS — D509 Iron deficiency anemia, unspecified: Secondary | ICD-10-CM | POA: Diagnosis not present

## 2021-05-10 DIAGNOSIS — N2581 Secondary hyperparathyroidism of renal origin: Secondary | ICD-10-CM | POA: Diagnosis not present

## 2021-05-10 DIAGNOSIS — N186 End stage renal disease: Secondary | ICD-10-CM | POA: Diagnosis not present

## 2021-05-10 DIAGNOSIS — Z992 Dependence on renal dialysis: Secondary | ICD-10-CM | POA: Diagnosis not present

## 2021-05-10 DIAGNOSIS — R197 Diarrhea, unspecified: Secondary | ICD-10-CM | POA: Diagnosis not present

## 2021-05-10 DIAGNOSIS — D631 Anemia in chronic kidney disease: Secondary | ICD-10-CM | POA: Diagnosis not present

## 2021-05-10 DIAGNOSIS — L299 Pruritus, unspecified: Secondary | ICD-10-CM | POA: Diagnosis not present

## 2021-05-10 DIAGNOSIS — R209 Unspecified disturbances of skin sensation: Secondary | ICD-10-CM | POA: Diagnosis not present

## 2021-05-12 DIAGNOSIS — L299 Pruritus, unspecified: Secondary | ICD-10-CM | POA: Diagnosis not present

## 2021-05-12 DIAGNOSIS — N2581 Secondary hyperparathyroidism of renal origin: Secondary | ICD-10-CM | POA: Diagnosis not present

## 2021-05-12 DIAGNOSIS — R209 Unspecified disturbances of skin sensation: Secondary | ICD-10-CM | POA: Diagnosis not present

## 2021-05-12 DIAGNOSIS — R197 Diarrhea, unspecified: Secondary | ICD-10-CM | POA: Diagnosis not present

## 2021-05-12 DIAGNOSIS — N186 End stage renal disease: Secondary | ICD-10-CM | POA: Diagnosis not present

## 2021-05-12 DIAGNOSIS — D631 Anemia in chronic kidney disease: Secondary | ICD-10-CM | POA: Diagnosis not present

## 2021-05-12 DIAGNOSIS — Z992 Dependence on renal dialysis: Secondary | ICD-10-CM | POA: Diagnosis not present

## 2021-05-12 DIAGNOSIS — D509 Iron deficiency anemia, unspecified: Secondary | ICD-10-CM | POA: Diagnosis not present

## 2021-05-14 DIAGNOSIS — G4733 Obstructive sleep apnea (adult) (pediatric): Secondary | ICD-10-CM | POA: Diagnosis not present

## 2021-05-15 DIAGNOSIS — D509 Iron deficiency anemia, unspecified: Secondary | ICD-10-CM | POA: Diagnosis not present

## 2021-05-15 DIAGNOSIS — L299 Pruritus, unspecified: Secondary | ICD-10-CM | POA: Diagnosis not present

## 2021-05-15 DIAGNOSIS — D631 Anemia in chronic kidney disease: Secondary | ICD-10-CM | POA: Diagnosis not present

## 2021-05-15 DIAGNOSIS — Z992 Dependence on renal dialysis: Secondary | ICD-10-CM | POA: Diagnosis not present

## 2021-05-15 DIAGNOSIS — R197 Diarrhea, unspecified: Secondary | ICD-10-CM | POA: Diagnosis not present

## 2021-05-15 DIAGNOSIS — R209 Unspecified disturbances of skin sensation: Secondary | ICD-10-CM | POA: Diagnosis not present

## 2021-05-15 DIAGNOSIS — N186 End stage renal disease: Secondary | ICD-10-CM | POA: Diagnosis not present

## 2021-05-15 DIAGNOSIS — N2581 Secondary hyperparathyroidism of renal origin: Secondary | ICD-10-CM | POA: Diagnosis not present

## 2021-05-16 DIAGNOSIS — Z992 Dependence on renal dialysis: Secondary | ICD-10-CM | POA: Diagnosis not present

## 2021-05-16 DIAGNOSIS — N186 End stage renal disease: Secondary | ICD-10-CM | POA: Diagnosis not present

## 2021-05-17 DIAGNOSIS — Z992 Dependence on renal dialysis: Secondary | ICD-10-CM | POA: Diagnosis not present

## 2021-05-17 DIAGNOSIS — N2581 Secondary hyperparathyroidism of renal origin: Secondary | ICD-10-CM | POA: Diagnosis not present

## 2021-05-17 DIAGNOSIS — L299 Pruritus, unspecified: Secondary | ICD-10-CM | POA: Diagnosis not present

## 2021-05-17 DIAGNOSIS — R209 Unspecified disturbances of skin sensation: Secondary | ICD-10-CM | POA: Diagnosis not present

## 2021-05-17 DIAGNOSIS — D509 Iron deficiency anemia, unspecified: Secondary | ICD-10-CM | POA: Diagnosis not present

## 2021-05-17 DIAGNOSIS — N186 End stage renal disease: Secondary | ICD-10-CM | POA: Diagnosis not present

## 2021-05-17 DIAGNOSIS — D631 Anemia in chronic kidney disease: Secondary | ICD-10-CM | POA: Diagnosis not present

## 2021-05-19 DIAGNOSIS — N2581 Secondary hyperparathyroidism of renal origin: Secondary | ICD-10-CM | POA: Diagnosis not present

## 2021-05-19 DIAGNOSIS — L299 Pruritus, unspecified: Secondary | ICD-10-CM | POA: Diagnosis not present

## 2021-05-19 DIAGNOSIS — D509 Iron deficiency anemia, unspecified: Secondary | ICD-10-CM | POA: Diagnosis not present

## 2021-05-19 DIAGNOSIS — D631 Anemia in chronic kidney disease: Secondary | ICD-10-CM | POA: Diagnosis not present

## 2021-05-19 DIAGNOSIS — N186 End stage renal disease: Secondary | ICD-10-CM | POA: Diagnosis not present

## 2021-05-19 DIAGNOSIS — R209 Unspecified disturbances of skin sensation: Secondary | ICD-10-CM | POA: Diagnosis not present

## 2021-05-19 DIAGNOSIS — I871 Compression of vein: Secondary | ICD-10-CM | POA: Diagnosis not present

## 2021-05-19 DIAGNOSIS — T82868A Thrombosis of vascular prosthetic devices, implants and grafts, initial encounter: Secondary | ICD-10-CM | POA: Diagnosis not present

## 2021-05-19 DIAGNOSIS — Z992 Dependence on renal dialysis: Secondary | ICD-10-CM | POA: Diagnosis not present

## 2021-05-22 DIAGNOSIS — L299 Pruritus, unspecified: Secondary | ICD-10-CM | POA: Diagnosis not present

## 2021-05-22 DIAGNOSIS — N2581 Secondary hyperparathyroidism of renal origin: Secondary | ICD-10-CM | POA: Diagnosis not present

## 2021-05-22 DIAGNOSIS — N186 End stage renal disease: Secondary | ICD-10-CM | POA: Diagnosis not present

## 2021-05-22 DIAGNOSIS — D631 Anemia in chronic kidney disease: Secondary | ICD-10-CM | POA: Diagnosis not present

## 2021-05-22 DIAGNOSIS — D509 Iron deficiency anemia, unspecified: Secondary | ICD-10-CM | POA: Diagnosis not present

## 2021-05-22 DIAGNOSIS — Z992 Dependence on renal dialysis: Secondary | ICD-10-CM | POA: Diagnosis not present

## 2021-05-22 DIAGNOSIS — R209 Unspecified disturbances of skin sensation: Secondary | ICD-10-CM | POA: Diagnosis not present

## 2021-05-24 DIAGNOSIS — N186 End stage renal disease: Secondary | ICD-10-CM | POA: Diagnosis not present

## 2021-05-24 DIAGNOSIS — N2581 Secondary hyperparathyroidism of renal origin: Secondary | ICD-10-CM | POA: Diagnosis not present

## 2021-05-24 DIAGNOSIS — D509 Iron deficiency anemia, unspecified: Secondary | ICD-10-CM | POA: Diagnosis not present

## 2021-05-24 DIAGNOSIS — R209 Unspecified disturbances of skin sensation: Secondary | ICD-10-CM | POA: Diagnosis not present

## 2021-05-24 DIAGNOSIS — Z992 Dependence on renal dialysis: Secondary | ICD-10-CM | POA: Diagnosis not present

## 2021-05-24 DIAGNOSIS — L299 Pruritus, unspecified: Secondary | ICD-10-CM | POA: Diagnosis not present

## 2021-05-24 DIAGNOSIS — D631 Anemia in chronic kidney disease: Secondary | ICD-10-CM | POA: Diagnosis not present

## 2021-05-25 ENCOUNTER — Ambulatory Visit: Payer: Self-pay | Admitting: Nurse Practitioner

## 2021-05-26 DIAGNOSIS — Z992 Dependence on renal dialysis: Secondary | ICD-10-CM | POA: Diagnosis not present

## 2021-05-26 DIAGNOSIS — L299 Pruritus, unspecified: Secondary | ICD-10-CM | POA: Diagnosis not present

## 2021-05-26 DIAGNOSIS — D509 Iron deficiency anemia, unspecified: Secondary | ICD-10-CM | POA: Diagnosis not present

## 2021-05-26 DIAGNOSIS — N2581 Secondary hyperparathyroidism of renal origin: Secondary | ICD-10-CM | POA: Diagnosis not present

## 2021-05-26 DIAGNOSIS — R209 Unspecified disturbances of skin sensation: Secondary | ICD-10-CM | POA: Diagnosis not present

## 2021-05-26 DIAGNOSIS — D631 Anemia in chronic kidney disease: Secondary | ICD-10-CM | POA: Diagnosis not present

## 2021-05-26 DIAGNOSIS — N186 End stage renal disease: Secondary | ICD-10-CM | POA: Diagnosis not present

## 2021-05-29 ENCOUNTER — Ambulatory Visit (HOSPITAL_COMMUNITY): Payer: Medicare Other | Admitting: Anesthesiology

## 2021-05-29 ENCOUNTER — Ambulatory Visit (HOSPITAL_BASED_OUTPATIENT_CLINIC_OR_DEPARTMENT_OTHER): Payer: Medicare Other | Admitting: Anesthesiology

## 2021-05-29 ENCOUNTER — Encounter (HOSPITAL_COMMUNITY): Payer: Self-pay | Admitting: Gastroenterology

## 2021-05-29 ENCOUNTER — Ambulatory Visit (HOSPITAL_COMMUNITY): Payer: Medicare Other

## 2021-05-29 ENCOUNTER — Ambulatory Visit (HOSPITAL_COMMUNITY)
Admission: RE | Admit: 2021-05-29 | Discharge: 2021-05-29 | Disposition: A | Discharge status: Home / Self Care | Payer: Medicare Other | Source: Ambulatory Visit | Attending: Gastroenterology | Admitting: Gastroenterology

## 2021-05-29 ENCOUNTER — Other Ambulatory Visit: Payer: Self-pay

## 2021-05-29 ENCOUNTER — Encounter (HOSPITAL_COMMUNITY)
Admission: RE | Disposition: A | Discharge status: Home / Self Care | Payer: Self-pay | Source: Ambulatory Visit | Attending: Gastroenterology

## 2021-05-29 DIAGNOSIS — Z992 Dependence on renal dialysis: Secondary | ICD-10-CM | POA: Diagnosis not present

## 2021-05-29 DIAGNOSIS — K635 Polyp of colon: Secondary | ICD-10-CM

## 2021-05-29 DIAGNOSIS — D121 Benign neoplasm of appendix: Secondary | ICD-10-CM | POA: Diagnosis not present

## 2021-05-29 DIAGNOSIS — Z9989 Dependence on other enabling machines and devices: Secondary | ICD-10-CM | POA: Diagnosis not present

## 2021-05-29 DIAGNOSIS — D125 Benign neoplasm of sigmoid colon: Secondary | ICD-10-CM | POA: Insufficient documentation

## 2021-05-29 DIAGNOSIS — R109 Unspecified abdominal pain: Secondary | ICD-10-CM

## 2021-05-29 DIAGNOSIS — I120 Hypertensive chronic kidney disease with stage 5 chronic kidney disease or end stage renal disease: Secondary | ICD-10-CM | POA: Insufficient documentation

## 2021-05-29 DIAGNOSIS — D120 Benign neoplasm of cecum: Secondary | ICD-10-CM | POA: Insufficient documentation

## 2021-05-29 DIAGNOSIS — K641 Second degree hemorrhoids: Secondary | ICD-10-CM | POA: Insufficient documentation

## 2021-05-29 DIAGNOSIS — G4730 Sleep apnea, unspecified: Secondary | ICD-10-CM

## 2021-05-29 DIAGNOSIS — Z87891 Personal history of nicotine dependence: Secondary | ICD-10-CM | POA: Diagnosis not present

## 2021-05-29 DIAGNOSIS — N186 End stage renal disease: Secondary | ICD-10-CM | POA: Insufficient documentation

## 2021-05-29 DIAGNOSIS — Z9189 Other specified personal risk factors, not elsewhere classified: Secondary | ICD-10-CM

## 2021-05-29 DIAGNOSIS — E669 Obesity, unspecified: Secondary | ICD-10-CM | POA: Diagnosis not present

## 2021-05-29 DIAGNOSIS — D126 Benign neoplasm of colon, unspecified: Secondary | ICD-10-CM

## 2021-05-29 DIAGNOSIS — R079 Chest pain, unspecified: Secondary | ICD-10-CM | POA: Diagnosis not present

## 2021-05-29 DIAGNOSIS — D631 Anemia in chronic kidney disease: Secondary | ICD-10-CM | POA: Diagnosis not present

## 2021-05-29 DIAGNOSIS — K631 Perforation of intestine (nontraumatic): Secondary | ICD-10-CM

## 2021-05-29 DIAGNOSIS — Z6833 Body mass index (BMI) 33.0-33.9, adult: Secondary | ICD-10-CM | POA: Diagnosis not present

## 2021-05-29 LAB — POCT I-STAT, CHEM 8
BUN: 62 mg/dL — ABNORMAL HIGH (ref 6–20)
Calcium, Ion: 1.1 mmol/L — ABNORMAL LOW (ref 1.15–1.40)
Chloride: 104 mmol/L (ref 98–111)
Creatinine, Ser: 16.5 mg/dL — ABNORMAL HIGH (ref 0.61–1.24)
Glucose, Bld: 95 mg/dL (ref 70–99)
HCT: 37 % — ABNORMAL LOW (ref 39.0–52.0)
Hemoglobin: 12.6 g/dL — ABNORMAL LOW (ref 13.0–17.0)
Potassium: 5 mmol/L (ref 3.5–5.1)
Sodium: 139 mmol/L (ref 135–145)
TCO2: 25 mmol/L (ref 22–32)

## 2021-05-29 SURGERY — COLONOSCOPY WITH PROPOFOL
Anesthesia: Monitor Anesthesia Care

## 2021-05-29 MED ORDER — OXYCODONE HCL 5 MG PO CAPS: 5.0000 mg | ORAL_CAPSULE | ORAL | 0 refills | Status: DC | PRN

## 2021-05-29 MED ORDER — TICAGRELOR 90 MG PO TABS: 90.0000 mg | ORAL_TABLET | Freq: Two times a day (BID) | ORAL | 0 refills | Status: DC

## 2021-05-29 MED ORDER — PROPOFOL 1000 MG/100ML IV EMUL
INTRAVENOUS | Status: AC
  Filled 2021-05-29: qty 100

## 2021-05-29 MED ORDER — CIPROFLOXACIN IN D5W 400 MG/200ML IV SOLN
INTRAVENOUS | Status: AC
  Filled 2021-05-29: qty 200

## 2021-05-29 MED ORDER — PROPOFOL 500 MG/50ML IV EMUL
INTRAVENOUS | Status: AC
  Filled 2021-05-29: qty 100

## 2021-05-29 MED ORDER — OXYCODONE HCL 5 MG PO TABS: 5.0000 mg | ORAL_TABLET | Freq: Four times a day (QID) | ORAL | 0 refills | Status: DC | PRN

## 2021-05-29 MED ORDER — OXYCODONE HCL 5 MG PO TABS
5.0000 mg | ORAL_TABLET | Freq: Once | ORAL | Status: AC
  Administered 2021-05-29: 5 mg via ORAL

## 2021-05-29 MED ORDER — HYOSCYAMINE SULFATE 0.125 MG/ML PO SOLN: 0.2500 mg | Freq: Once | ORAL | Status: DC

## 2021-05-29 MED ORDER — PROPOFOL 500 MG/50ML IV EMUL
INTRAVENOUS | Status: AC
  Filled 2021-05-29: qty 50

## 2021-05-29 MED ORDER — SODIUM CHLORIDE 0.9 % IV SOLN: INTRAVENOUS | Status: DC

## 2021-05-29 MED ORDER — PROPOFOL 500 MG/50ML IV EMUL
INTRAVENOUS | Status: DC | PRN
  Administered 2021-05-29: 125 ug/kg/min via INTRAVENOUS

## 2021-05-29 MED ORDER — HYOSCYAMINE SULFATE 0.125 MG SL SUBL
0.1250 mg | SUBLINGUAL_TABLET | Freq: Once | SUBLINGUAL | Status: AC
  Administered 2021-05-29: 0.125 mg via SUBLINGUAL
  Filled 2021-05-29: qty 1

## 2021-05-29 MED ORDER — CIPROFLOXACIN IN D5W 400 MG/200ML IV SOLN
INTRAVENOUS | Status: DC | PRN
Start: 2021-05-29 — End: 2021-05-29
  Administered 2021-05-29: 400 mg via INTRAVENOUS

## 2021-05-29 MED ORDER — SODIUM CHLORIDE (PF) 0.9 % IJ SOLN
PREFILLED_SYRINGE | INTRAMUSCULAR | Status: DC | PRN
  Administered 2021-05-29: 3 mL

## 2021-05-29 MED ORDER — LIDOCAINE HCL 1 % IJ SOLN
INTRAMUSCULAR | Status: DC | PRN
  Administered 2021-05-29 (×2): 50 mg via INTRADERMAL

## 2021-05-29 MED ORDER — PROPOFOL 10 MG/ML IV BOLUS
INTRAVENOUS | Status: DC | PRN
Start: 2021-05-29 — End: 2021-05-29
  Administered 2021-05-29: 50 mg via INTRAVENOUS

## 2021-05-29 SURGICAL SUPPLY — 22 items

## 2021-05-29 NOTE — Op Note (Signed)
[facility] Patient Name: Michael [name] Procedure Date: 05/29/2021 MRN: [id number] Attending MD: Justice Britain , MD Date of Birth: 20-Apr-1975 CSN: [id number] Age: 46 Admit Type: Outpatient Procedure:                Colonoscopy Indications:              Excision of colonic polyp Providers:                Justice Britain, MD, Burtis Junes, RN, Luan Moore, Technician, Karis Juba, CRNA Referring MD:             Thornton Park MD, MD, Asencion Partridge. [place] Medicines:                Monitored Anesthesia Care, Cipro 379 mg IV Complications:            No immediate complications. Estimated Blood Loss:     Estimated blood loss was minimal. Procedure:                Pre-Anesthesia Assessment:                           - Prior to the procedure, a History and Physical                            was performed, and patient medications and                            allergies were reviewed. The patient's tolerance of                            previous anesthesia was also reviewed. The risks                            and benefits of the procedure and the sedation                            options and risks were discussed with the patient.                            All questions were answered, and informed consent                            was obtained. Prior Anticoagulants: The patient has                            taken Plavix (clopidogrel), last dose was 6 days                            prior to procedure. ASA Grade Assessment: III - A                            patient with severe systemic disease. After  reviewing the risks and benefits, the patient was                            deemed in satisfactory condition to undergo the                            procedure.                           After obtaining informed consent, the colonoscope                            was passed under direct vision. Throughout  the                            procedure, the patient's blood pressure, pulse, and                            oxygen saturations were monitored continuously. The                            CF-HQ190L ([id number]) Olympus colonoscope was                            introduced through the anus and advanced to the the                            cecum, identified by appendiceal orifice and                            ileocecal valve. The colonoscopy was performed                            without difficulty. The patient tolerated the                            procedure. The quality of the bowel preparation was                            adequate. The ileocecal valve, appendiceal orifice,                            and rectum were photographed. Scope In: 11:49:47 AM Scope Out: 12:44:45 PM Scope Withdrawal Time: 0 hours 51 minutes 27 seconds  Total Procedure Duration: 0 hours 54 minutes 58 seconds  Findings:      The digital rectal exam findings include hemorrhoids. Pertinent       negatives include no palpable rectal lesions.      Old red blood and small clots were found in the rectum, in the       recto-sigmoid colon and in the sigmoid colon. Lavage of the area was       performed using copious amounts, resulting in clearance with adequate       visualization.      A 12 mm polyp was found at the appendiceal orifice. The polyp was  semi-sessile. Preparations were made for mucosal resection. NBI imaging       and White-light endoscopy was done to demarcate the borders of the       lesion. Everlift was injected to raise the lesion out of the appendix       and this was successful. Cold snare mucosal resection was performed.       Resection and retrieval were complete and the orifice was clear.      A greater than 50 mm polyp was found in the sigmoid colon. There was       evidence of some ulceration of the polyp. The polyp was pedunculated.       Tattoo was noted in the base of the polyp and  on the mucosa throughout       the region that had been previously placed. Preparations were made for       mucosal resection. NBI imaging and White-light endoscopy was done to       demarcate the borders of the lesion. A 1:10,000 solution of epinephrine       was injected to aid in hemostasis of the lesion (total of 3 mL). Olympus       polyloop was placed over the polyp with some difficulty but cinched in a       good position near the base of the lesion. Snare mucosal resection was       performed. Resection and retrieval were complete. To prevent bleeding       after mucosal resection, two hemostatic clips were successfully placed       (MR conditional). There was no bleeding during, or at the end, of the       procedure.      Three sessile polyps were found in the sigmoid colon (2) and cecum (1).       The polyps were 2 to 5 mm in size. These polyps were removed with a cold       snare. Resection and retrieval were complete.      Normal mucosa was found in the entire colon otherwise.      Non-bleeding non-thrombosed internal hemorrhoids were found during       retroflexion, during perianal exam and during digital exam. The       hemorrhoids were Grade II (internal hemorrhoids that prolapse but reduce       spontaneously). Impression:               - Hemorrhoids found on digital rectal exam.                           - Old blood in the rectum, in the recto-sigmoid                            colon and in the sigmoid colon - lavaged.                           - One 12 mm polyp at the appendiceal orifice,                            removed with cold mucosal resection after lifting  it out of the appendix. Resected and retrieved.                           - One greater than 50 mm polyp in the sigmoid                            colon, removed with mucosal resection. Polyloop was                            placed around the lesion. Snare resection then                             performed. Resected and retrieved. Clips (MR                            conditional) were placed.                           - Three 2 to 5 mm polyps in the sigmoid colon and                            in the cecum, removed with a cold snare. Resected                            and retrieved.                           - Normal mucosa in the entire examined colon                            otherwise.                           - Non-bleeding non-thrombosed internal hemorrhoids. Moderate Sedation:      Not Applicable - Patient had care per Anesthesia. Recommendation:           - The patient will be observed post-procedure,                            until all discharge criteria are met.                           - Discharge patient to home.                           - Patient has a contact number available for                            emergencies. The signs and symptoms of potential                            delayed complications were discussed with the                            patient. Return to  normal activities tomorrow.                            Written discharge instructions were provided to the                            patient.                           - High fiber diet.                           - Use FiberCon 1-2 tablets PO daily.                           - Continue present medications.                           - May restart Ticagrelor in 48 hours (3/15 PM) to                            decrease post-interventional bleeding.                           - Monitor for signs/symptoms of bleeding,                            perforation, and infection. If issues please call                            our number to get further assistance as needed.                           - Await pathology results.                           - Repeat colonoscopy in 3-years for follow up if no                            evidence of malignancy and negative margins are                             noted. Otherwise may follow up in 1-year.                           - The findings and recommendations were discussed                            with the patient. Procedure Code(s):        --- Professional ---                           [phone], Colonoscopy, flexible; with endoscopic                            mucosal resection  84859, 59, Colonoscopy, flexible; with removal of                            tumor(s), polyp(s), or other lesion(s) by snare                            technique Diagnosis Code(s):        --- Professional ---                           K64.1, Second degree hemorrhoids                           K62.5, Hemorrhage of anus and rectum                           K92.2, Gastrointestinal hemorrhage, unspecified                           K63.5, Polyp of colon CPT copyright 2019 American Medical Association. All rights reserved. The codes documented in this report are preliminary and upon coder review may  be revised to meet current compliance requirements. Justice Britain, MD 05/29/2021 1:10:37 PM Number of Addenda: 0

## 2021-05-29 NOTE — Transfer of Care (Signed)
Immediate Anesthesia Transfer of Care Note ? ?Patient: Michael [name] ? ?Procedure(s) Performed: COLONOSCOPY WITH PROPOFOL ?ENDOSCOPIC MUCOSAL RESECTION ?SUBMUCOSAL LIFTING INJECTION ?POLYPECTOMY ?SUBMUCOSAL INJECTION ?HEMOSTASIS CLIP PLACEMENT ? ?Patient Location: PACU and Endoscopy Unit ? ?Anesthesia Type:MAC ? ?Level of Consciousness: awake, alert , oriented and patient cooperative ? ?Airway & Oxygen Therapy: Patient Spontanous Breathing and Patient connected to face mask oxygen ? ?Post-op Assessment: Report given to RN and Post -op Vital signs reviewed and stable ? ?Post vital signs: Reviewed and stable ? ?Last Vitals:  ?Vitals Value Taken Time  ?BP 214/75 05/29/21 1254  ?Temp    ?Pulse 86 05/29/21 1256  ?Resp 15 05/29/21 1256  ?SpO2 100 % 05/29/21 1256  ?Vitals shown include unvalidated device data. ? ?Last Pain:  ?Vitals:  ? 05/29/21 1025  ?TempSrc: Oral  ?PainSc: 0-No pain  ?   ? ?  ? ?Complications: No notable events documented. ?

## 2021-05-29 NOTE — Anesthesia Postprocedure Evaluation (Signed)
Anesthesia Post Note ? ?Patient: Michael [name] ? ?Procedure(s) Performed: COLONOSCOPY WITH PROPOFOL ?ENDOSCOPIC MUCOSAL RESECTION ?SUBMUCOSAL LIFTING INJECTION ?POLYPECTOMY ?SUBMUCOSAL INJECTION ?HEMOSTASIS CLIP PLACEMENT ? ?  ? ?Patient location during evaluation: Endoscopy ?Anesthesia Type: MAC ?Level of consciousness: awake and alert, patient cooperative and oriented ?Pain management: pain level controlled ?Vital Signs Assessment: post-procedure vital signs reviewed and stable ?Respiratory status: spontaneous breathing, nonlabored ventilation and respiratory function stable ?Cardiovascular status: blood pressure returned to baseline and stable ?Postop Assessment: able to ambulate and no apparent nausea or vomiting ?Anesthetic complications: no ? ? ?No notable events documented. ? ?Last Vitals:  ?Vitals:  ? 05/29/21 1410 05/29/21 1420  ?BP: (!) 180/85 (!) 155/82  ?Pulse: 70 63  ?Resp: 16 13  ?Temp:    ?SpO2: 97% 97%  ?  ?Last Pain:  ?Vitals:  ? 05/29/21 1420  ?TempSrc:   ?PainSc: 6   ? ? ?  ?  ?  ?  ?  ?  ? ?Idolina Mantell,E. Prudie Guthridge ? ? ? ? ?

## 2021-05-29 NOTE — H&P (Signed)
? ?GASTROENTEROLOGY PROCEDURE H&P NOTE  ? ?Primary Care Physician: ?[name], FNP ? ?HPI: ?Michael [name] is a 46 y.o. male who presents for Colonoscopy for attempt at EMR of [place] polyp and prevent surgery if possible. ? ?Past Medical History:  ?Diagnosis Date  ? Gout   ? History of renal dialysis   ? M-W-F  ? HLD (hyperlipidemia)   ? Hypertension   ? Pneumonia   ? Polycystic kidney disease   ? Sleep apnea 07/29/2018  ? uses cpap nightly  ? Vitamin D deficiency 11/2018  ? ?Past Surgical History:  ?Procedure Laterality Date  ? AV FISTULA PLACEMENT Left 03/24/2020  ? Procedure: INSERTION OF LEFT UPPER EXTREMITY ARTERIOVENOUS (AV) GORE-TEX GRAFT;  Surgeon: [name], MD;  Location: [facility] OR;  Service: Vascular;  Laterality: Left;  PERIPHERAL NERVE BLOCK  ? BASCILIC VEIN TRANSPOSITION Left 12/11/2019  ? Procedure: 1ST STAGE BASILIC TRANSPOSITION OF LEFT ARM;  Surgeon: Angelia Mould, MD;  Location: [place];  Service: Vascular;  Laterality: Left;  ? BIOPSY  04/06/2021  ? Procedure: BIOPSY;  Surgeon: Thornton Park, MD;  Location: [facility];  Service: Gastroenterology;;  ? COLONOSCOPY WITH PROPOFOL N/A 04/06/2021  ? Procedure: COLONOSCOPY WITH PROPOFOL;  Surgeon: Thornton Park, MD;  Location: [facility];  Service: Gastroenterology;  Laterality: N/A;  ? DG AV DIALYSIS GRAFT DECLOT OR Left 05/17/2020  ? IR FLUORO GUIDE CV LINE RIGHT  12/09/2019  ? IR US GUIDE VASC ACCESS RIGHT  12/09/2019  ? LEFT HEART CATH AND CORONARY ANGIOGRAPHY N/A 12/09/2019  ? Procedure: LEFT HEART CATH AND CORONARY ANGIOGRAPHY;  Surgeon: Adrian Prows, MD;  Location: [place] [facility];  Service: Cardiovascular;  Laterality: N/A;  ? POLYPECTOMY  04/06/2021  ? Procedure: POLYPECTOMY;  Surgeon: Thornton Park, MD;  Location: [facility];  Service: Gastroenterology;;  ? SUBMUCOSAL TATTOO INJECTION  04/06/2021  ? Procedure: SUBMUCOSAL TATTOO INJECTION;  Surgeon: Thornton Park, MD;  Location: [facility];  Service:  Gastroenterology;;  ? ?Current Facility-Administered Medications  ?Medication Dose Route Frequency Provider Last Rate Last Admin  ? 0.9 %  sodium chloride infusion   Intravenous Continuous Mansouraty, Telford Nab., MD      ? ? ?Current Facility-Administered Medications:  ?  0.9 %  sodium chloride infusion, , Intravenous, Continuous, Mansouraty, Telford Nab., MD ?Allergies  ?Allergen Reactions  ? Ivp Dye [Iodinated Contrast Media] Itching  ? ?Family History  ?Problem Relation Age of Onset  ? Diabetes Mother   ? Ovarian cancer Mother   ? Bipolar disorder Father   ? Hypertension Father   ? Prostate cancer Father   ? Prostate cancer Paternal Grandfather   ? Polycystic kidney disease Neg Hx   ? ?Social History  ? ?Socioeconomic History  ? Marital status: Single  ?  Spouse name: Not on file  ? Number of children: 1  ? Years of education: Not on file  ? Highest education level: Not on file  ?Occupational History  ? Occupation: [employer]  ? Occupation: disabled  ?Tobacco Use  ? Smoking status: Former  ?  Packs/day: 0.20  ?  Years: 10.00  ?  Pack years: 2.00  ?  Types: Cigarettes  ?  Quit date: 2015  ?  Years since quitting: 8.2  ? Smokeless tobacco: Never  ?Vaping Use  ? Vaping Use: Never used  ?Substance and Sexual Activity  ? Alcohol use: Yes  ?  Comment: occasional wine  ? Drug use: No  ? Sexual activity: Yes  ?Other Topics  Concern  ? Not on file  ?Social History Narrative  ? Not on file  ? ?Social Determinants of Health  ? ?Financial Resource Strain: High Risk  ? Difficulty of Paying Living Expenses: Very hard  ?Food Insecurity: No Food Insecurity  ? Worried About Charity fundraiser in the Last Year: Never true  ? Ran Out of Food in the Last Year: Never true  ?Transportation Needs: No Transportation Needs  ? Lack of Transportation (Medical): No  ? Lack of Transportation (Non-Medical): No  ?Physical Activity: Insufficiently Active  ? Days of Exercise per Week: 3 days  ? Minutes of Exercise per Session: 30 min  ?Stress:  Stress Concern Present  ? Feeling of Stress : To some extent  ?Social Connections: Moderately Integrated  ? Frequency of Communication with Friends and Family: More than three times a week  ? Frequency of Social Gatherings with Friends and Family: Once a week  ? Attends Religious Services: 1 to 4 times per year  ? Active Member of Clubs or Organizations: Yes  ? Attends Archivist Meetings: 1 to 4 times per year  ? Marital Status: Never married  ?Intimate Partner Violence: Not At Risk  ? Fear of Current or Ex-Partner: No  ? Emotionally Abused: No  ? Physically Abused: No  ? Sexually Abused: No  ? ? ?Physical Exam: ?Today's Vitals  ? 05/29/21 1025  ?BP: (!) 180/100  ?Pulse: 86  ?Resp: 18  ?Temp: 98.7 ?F (37.1 ?C)  ?TempSrc: Oral  ?SpO2: 97%  ?Weight: 117.9 kg  ?Height: '6\' 2"'$  (1.88 m)  ?PainSc: 0-No pain  ? ?Body mass index is 33.38 kg/m?. ?GEN: NAD ?EYE: Sclerae anicteric ?ENT: MMM ?CV: Non-tachycardic ?GI: Soft, NT/ND ?NEURO:  Alert & Oriented x 3 ? ?Lab Results: ?No results for input(s): WBC, HGB, HCT, PLT in the last 72 hours. ?BMET ?No results for input(s): NA, K, CL, CO2, GLUCOSE, BUN, CREATININE, CALCIUM in the last 72 hours. ?LFT ?No results for input(s): PROT, ALBUMIN, AST, ALT, ALKPHOS, BILITOT, BILIDIR, IBILI in the last 72 hours. ?PT/INR ?No results for input(s): LABPROT, INR in the last 72 hours. ? ? ?Impression / Plan: ?This is a 46 y.o.male who presents for Colonoscopy for attempt at EMR of [place] polyp and prevent surgery if possible. ? ?Based upon the description and endoscopic pictures I do feel that it is reasonable to pursue an Advanced Polypectomy attempt of the polyp/lesion.  We discussed some of the techniques of advanced polypectomy which include Endoscopic Mucosal Resection, OVESCO Full-Thickness Resection, Endorotor Morcellation, and Tissue Ablation via Fulguration.  We also reviewed images of typical techniques as noted above.  The risks and benefits of endoscopic evaluation were  discussed with the patient; these include but are not limited to the risk of perforation, infection, bleeding, missed lesions, lack of diagnosis, severe illness requiring hospitalization, as well as anesthesia and sedation related illnesses.  During attempts at advanced resection, the risks of bleeding and perforation/leak are increased as opposed to diagnostic and screening procedures, and that was discussed with the patient as well.   In addition, I explained that with the possible need for piecemeal resection, subsequent short-interval endoscopic evaluation for follow up and potential retreatment of the lesion/area may be necessary.  If, after attempt at removal of the polyp/lesion, it is found that the patient has a complication or that an invasive lesion or malignant lesion is found, or that the polyp/lesion continues to recur, the patient is aware and understands that surgery  may still be indicated/required.  All patient questions were answered, to the best of my ability, and the patient agrees to the aforementioned plan of action with follow-up as indicated. ? ? ?The risks and benefits of endoscopic evaluation/treatment were discussed with the patient and/or family; these include but are not limited to the risk of perforation, infection, bleeding, missed lesions, lack of diagnosis, severe illness requiring hospitalization, as well as anesthesia and sedation related illnesses.  The patient's history has been reviewed, patient examined, no change in status, and deemed stable for procedure.  The patient and/or family is agreeable to proceed.  ? ? ?Justice Britain, MD ?[facility] ?Advanced Endoscopy ?Office # [phone] ? ?

## 2021-05-29 NOTE — Progress Notes (Signed)
Brief GI progress note ? ?Patient was evaluated after his procedure. ?He was having lower abdominal discomfort and cramping.  He had not been passing gas.  I put the patient back in left lateral decubitus position in an effort of trying to help him pass gas. ?Discomfort persisted. ?We decided to give him 25 mcg of fentanyl. ?His IV infiltrated. ?We proceeded with giving him 5 mg oral oxycodone. ?He was reevaluated and was having some persistent discomfort. ?Levsin was ordered. ?We proceeded with ordering a KUB and chest x-ray to ensure no evidence of a perforation. ?These imaging studies were negative. ?He felt improved after the Levsin and oxycodone combination. ?The patient felt comfortable for discharge. ?He was given a short course of oxycodone to try and get him through the rest of today into tomorrow. ?My pretest probability of a perforation is low and thus I felt okay to move forward with him going home.  He certainly knows to come back if things progress or worsen.  I suspect there was some issues of retained gas as well as potentially epinephrine effect as a result of the polyp resection. ?I will have my team reach out to the patient tomorrow to ensure that he is still doing well. ?We have had to send his oxycodone prescription to [facility] pharmacies in an effort of trying to get it covered. ? ?Justice Britain, MD ?[facility] ?Advanced Endoscopy ?Office # [phone] ?

## 2021-05-29 NOTE — Discharge Instructions (Signed)
YOU HAD AN ENDOSCOPIC PROCEDURE TODAY: Refer to the procedure report and other information in the discharge instructions given to you for any specific questions about what was found during the examination. If this information does not answer your questions, please call [place] office at [phone] to clarify.  ° °YOU SHOULD EXPECT: Some feelings of bloating in the abdomen. Passage of more gas than usual. Walking can help get rid of the air that was put into your GI tract during the procedure and reduce the bloating. If you had a lower endoscopy (such as a colonoscopy or flexible sigmoidoscopy) you may notice spotting of blood in your stool or on the toilet paper. Some abdominal soreness may be present for a day or two, also. ° °DIET: Your first meal following the procedure should be a light meal and then it is ok to progress to your normal diet. A half-sandwich or bowl of soup is an example of a good first meal. Heavy or fried foods are harder to digest and may make you feel nauseous or bloated. Drink plenty of fluids but you should avoid alcoholic beverages for 24 hours. If you had a esophageal dilation, please see attached instructions for diet.   ° °ACTIVITY: Your care partner should take you home directly after the procedure. You should plan to take it easy, moving slowly for the rest of the day. You can resume normal activity the day after the procedure however YOU SHOULD NOT DRIVE, use power tools, machinery or perform tasks that involve climbing or major physical exertion for 24 hours (because of the sedation medicines used during the test).  ° °SYMPTOMS TO REPORT IMMEDIATELY: °A gastroenterologist can be reached at any hour. Please call [phone]  for any of the following symptoms:  °Following lower endoscopy (colonoscopy, flexible sigmoidoscopy) °Excessive amounts of blood in the stool  °Significant tenderness, worsening of abdominal pains  °Swelling of the abdomen that is new, acute  °Fever of 100° or  higher  °Following upper endoscopy (EGD, EUS, ERCP, esophageal dilation) °Vomiting of blood or coffee ground material  °New, significant abdominal pain  °New, significant chest pain or pain under the shoulder blades  °Painful or persistently difficult swallowing  °New shortness of breath  °Black, tarry-looking or red, bloody stools ° °FOLLOW UP:  °If any biopsies were taken you will be contacted by phone or by letter within the next 1-3 weeks. Call [phone]  if you have not heard about the biopsies in 3 weeks.  °Please also call with any specific questions about appointments or follow up tests. ° °

## 2021-05-29 NOTE — Anesthesia Preprocedure Evaluation (Addendum)
Anesthesia Evaluation  ?Patient identified by MRN, date of birth, ID band ?Patient awake ? ? ? ?Reviewed: ?Allergy & Precautions, NPO status , Patient's Chart, lab work & pertinent test results ? ?Airway ?Mallampati: II ? ?TM Distance: >3 FB ?Neck ROM: Full ? ? ? Dental ?no notable dental hx. ? ?  ?Pulmonary ?sleep apnea and Continuous Positive Airway Pressure Ventilation , former smoker,  ?  ?Pulmonary exam normal ?breath sounds clear to auscultation ? ? ? ? ? ? Cardiovascular ?hypertension, Normal cardiovascular exam ?Rhythm:Regular Rate:Normal ? ? ?  ?Neuro/Psych ?PSYCHIATRIC DISORDERS negative neurological ROS ?   ? GI/Hepatic ?negative GI ROS, Neg liver ROS,   ?Endo/Other  ?negative endocrine ROS ? Renal/GU ?ESRF and DialysisRenal disease  ? ?  ?Musculoskeletal ? ?(+) Arthritis , Chronic gout   ? Abdominal ?(+) + obese,   ?Peds ? Hematology ? ?(+) Blood dyscrasia, anemia ,   ?Anesthesia Other Findings ?adenoma with high grade dysplasia of sigmoid colon; high risk colon cancer ? Reproductive/Obstetrics ? ?  ? ? ? ? ? ? ? ? ? ? ? ? ? ?  ?  ? ? ? ? ? ? ? ?Anesthesia Physical ?Anesthesia Plan ? ?ASA: 3 ? ?Anesthesia Plan: MAC  ? ?Post-op Pain Management:   ? ?Induction: Intravenous ? ?PONV Risk Score and Plan: 1 and Propofol infusion and Treatment may vary due to age or medical condition ? ?Airway Management Planned: Simple Face Mask ? ?Additional Equipment:  ? ?Intra-op Plan:  ? ?Post-operative Plan:  ? ?Informed Consent: I have reviewed the patients History and Physical, chart, labs and discussed the procedure including the risks, benefits and alternatives for the proposed anesthesia with the patient or authorized representative who has indicated his/her understanding and acceptance.  ? ? ? ?Dental advisory given ? ?Plan Discussed with: CRNA ? ?Anesthesia Plan Comments:   ? ? ? ? ? ? ?Anesthesia Quick Evaluation ? ?

## 2021-05-30 ENCOUNTER — Encounter (HOSPITAL_COMMUNITY): Payer: Self-pay | Admitting: Gastroenterology

## 2021-05-30 LAB — SURGICAL PATHOLOGY

## 2021-05-31 ENCOUNTER — Encounter: Payer: Self-pay | Admitting: Gastroenterology

## 2021-05-31 DIAGNOSIS — D631 Anemia in chronic kidney disease: Secondary | ICD-10-CM | POA: Diagnosis not present

## 2021-05-31 DIAGNOSIS — N186 End stage renal disease: Secondary | ICD-10-CM | POA: Diagnosis not present

## 2021-05-31 DIAGNOSIS — L299 Pruritus, unspecified: Secondary | ICD-10-CM | POA: Diagnosis not present

## 2021-05-31 DIAGNOSIS — N2581 Secondary hyperparathyroidism of renal origin: Secondary | ICD-10-CM | POA: Diagnosis not present

## 2021-05-31 DIAGNOSIS — D509 Iron deficiency anemia, unspecified: Secondary | ICD-10-CM | POA: Diagnosis not present

## 2021-05-31 DIAGNOSIS — R209 Unspecified disturbances of skin sensation: Secondary | ICD-10-CM | POA: Diagnosis not present

## 2021-05-31 DIAGNOSIS — Z992 Dependence on renal dialysis: Secondary | ICD-10-CM | POA: Diagnosis not present

## 2021-06-02 DIAGNOSIS — D631 Anemia in chronic kidney disease: Secondary | ICD-10-CM | POA: Diagnosis not present

## 2021-06-02 DIAGNOSIS — L299 Pruritus, unspecified: Secondary | ICD-10-CM | POA: Diagnosis not present

## 2021-06-02 DIAGNOSIS — D509 Iron deficiency anemia, unspecified: Secondary | ICD-10-CM | POA: Diagnosis not present

## 2021-06-02 DIAGNOSIS — R209 Unspecified disturbances of skin sensation: Secondary | ICD-10-CM | POA: Diagnosis not present

## 2021-06-02 DIAGNOSIS — Z992 Dependence on renal dialysis: Secondary | ICD-10-CM | POA: Diagnosis not present

## 2021-06-02 DIAGNOSIS — N2581 Secondary hyperparathyroidism of renal origin: Secondary | ICD-10-CM | POA: Diagnosis not present

## 2021-06-02 DIAGNOSIS — N186 End stage renal disease: Secondary | ICD-10-CM | POA: Diagnosis not present

## 2021-06-05 DIAGNOSIS — R209 Unspecified disturbances of skin sensation: Secondary | ICD-10-CM | POA: Diagnosis not present

## 2021-06-05 DIAGNOSIS — N186 End stage renal disease: Secondary | ICD-10-CM | POA: Diagnosis not present

## 2021-06-05 DIAGNOSIS — Z992 Dependence on renal dialysis: Secondary | ICD-10-CM | POA: Diagnosis not present

## 2021-06-05 DIAGNOSIS — D509 Iron deficiency anemia, unspecified: Secondary | ICD-10-CM | POA: Diagnosis not present

## 2021-06-05 DIAGNOSIS — L299 Pruritus, unspecified: Secondary | ICD-10-CM | POA: Diagnosis not present

## 2021-06-05 DIAGNOSIS — N2581 Secondary hyperparathyroidism of renal origin: Secondary | ICD-10-CM | POA: Diagnosis not present

## 2021-06-05 DIAGNOSIS — D631 Anemia in chronic kidney disease: Secondary | ICD-10-CM | POA: Diagnosis not present

## 2021-06-07 DIAGNOSIS — N186 End stage renal disease: Secondary | ICD-10-CM | POA: Diagnosis not present

## 2021-06-07 DIAGNOSIS — Z992 Dependence on renal dialysis: Secondary | ICD-10-CM | POA: Diagnosis not present

## 2021-06-07 DIAGNOSIS — R209 Unspecified disturbances of skin sensation: Secondary | ICD-10-CM | POA: Diagnosis not present

## 2021-06-07 DIAGNOSIS — D631 Anemia in chronic kidney disease: Secondary | ICD-10-CM | POA: Diagnosis not present

## 2021-06-07 DIAGNOSIS — D509 Iron deficiency anemia, unspecified: Secondary | ICD-10-CM | POA: Diagnosis not present

## 2021-06-07 DIAGNOSIS — L299 Pruritus, unspecified: Secondary | ICD-10-CM | POA: Diagnosis not present

## 2021-06-07 DIAGNOSIS — N2581 Secondary hyperparathyroidism of renal origin: Secondary | ICD-10-CM | POA: Diagnosis not present

## 2021-06-09 DIAGNOSIS — R209 Unspecified disturbances of skin sensation: Secondary | ICD-10-CM | POA: Diagnosis not present

## 2021-06-09 DIAGNOSIS — N2581 Secondary hyperparathyroidism of renal origin: Secondary | ICD-10-CM | POA: Diagnosis not present

## 2021-06-09 DIAGNOSIS — D631 Anemia in chronic kidney disease: Secondary | ICD-10-CM | POA: Diagnosis not present

## 2021-06-09 DIAGNOSIS — Z992 Dependence on renal dialysis: Secondary | ICD-10-CM | POA: Diagnosis not present

## 2021-06-09 DIAGNOSIS — N186 End stage renal disease: Secondary | ICD-10-CM | POA: Diagnosis not present

## 2021-06-09 DIAGNOSIS — D509 Iron deficiency anemia, unspecified: Secondary | ICD-10-CM | POA: Diagnosis not present

## 2021-06-09 DIAGNOSIS — L299 Pruritus, unspecified: Secondary | ICD-10-CM | POA: Diagnosis not present

## 2021-06-11 NOTE — Progress Notes (Signed)
Surveillance colonoscopy in 1 year. Thanks.  ?

## 2021-06-12 DIAGNOSIS — R209 Unspecified disturbances of skin sensation: Secondary | ICD-10-CM | POA: Diagnosis not present

## 2021-06-12 DIAGNOSIS — N186 End stage renal disease: Secondary | ICD-10-CM | POA: Diagnosis not present

## 2021-06-12 DIAGNOSIS — N2581 Secondary hyperparathyroidism of renal origin: Secondary | ICD-10-CM | POA: Diagnosis not present

## 2021-06-12 DIAGNOSIS — D631 Anemia in chronic kidney disease: Secondary | ICD-10-CM | POA: Diagnosis not present

## 2021-06-12 DIAGNOSIS — L299 Pruritus, unspecified: Secondary | ICD-10-CM | POA: Diagnosis not present

## 2021-06-12 DIAGNOSIS — Z992 Dependence on renal dialysis: Secondary | ICD-10-CM | POA: Diagnosis not present

## 2021-06-12 DIAGNOSIS — D509 Iron deficiency anemia, unspecified: Secondary | ICD-10-CM | POA: Diagnosis not present

## 2021-06-14 DIAGNOSIS — N2581 Secondary hyperparathyroidism of renal origin: Secondary | ICD-10-CM | POA: Diagnosis not present

## 2021-06-14 DIAGNOSIS — L299 Pruritus, unspecified: Secondary | ICD-10-CM | POA: Diagnosis not present

## 2021-06-14 DIAGNOSIS — D631 Anemia in chronic kidney disease: Secondary | ICD-10-CM | POA: Diagnosis not present

## 2021-06-14 DIAGNOSIS — Z992 Dependence on renal dialysis: Secondary | ICD-10-CM | POA: Diagnosis not present

## 2021-06-14 DIAGNOSIS — N186 End stage renal disease: Secondary | ICD-10-CM | POA: Diagnosis not present

## 2021-06-16 DIAGNOSIS — N2581 Secondary hyperparathyroidism of renal origin: Secondary | ICD-10-CM | POA: Diagnosis not present

## 2021-06-16 DIAGNOSIS — Z992 Dependence on renal dialysis: Secondary | ICD-10-CM | POA: Diagnosis not present

## 2021-06-16 DIAGNOSIS — N186 End stage renal disease: Secondary | ICD-10-CM | POA: Diagnosis not present

## 2021-06-16 DIAGNOSIS — D631 Anemia in chronic kidney disease: Secondary | ICD-10-CM | POA: Diagnosis not present

## 2021-06-16 DIAGNOSIS — L299 Pruritus, unspecified: Secondary | ICD-10-CM | POA: Diagnosis not present

## 2021-06-19 DIAGNOSIS — R52 Pain, unspecified: Secondary | ICD-10-CM | POA: Diagnosis not present

## 2021-06-19 DIAGNOSIS — R209 Unspecified disturbances of skin sensation: Secondary | ICD-10-CM | POA: Diagnosis not present

## 2021-06-19 DIAGNOSIS — N2581 Secondary hyperparathyroidism of renal origin: Secondary | ICD-10-CM | POA: Diagnosis not present

## 2021-06-19 DIAGNOSIS — D631 Anemia in chronic kidney disease: Secondary | ICD-10-CM | POA: Diagnosis not present

## 2021-06-19 DIAGNOSIS — N186 End stage renal disease: Secondary | ICD-10-CM | POA: Diagnosis not present

## 2021-06-19 DIAGNOSIS — T7840XA Allergy, unspecified, initial encounter: Secondary | ICD-10-CM | POA: Diagnosis not present

## 2021-06-19 DIAGNOSIS — R110 Nausea: Secondary | ICD-10-CM | POA: Diagnosis not present

## 2021-06-19 DIAGNOSIS — Z992 Dependence on renal dialysis: Secondary | ICD-10-CM | POA: Diagnosis not present

## 2021-06-19 DIAGNOSIS — T82868A Thrombosis of vascular prosthetic devices, implants and grafts, initial encounter: Secondary | ICD-10-CM | POA: Diagnosis not present

## 2021-06-19 DIAGNOSIS — I871 Compression of vein: Secondary | ICD-10-CM | POA: Diagnosis not present

## 2021-06-19 DIAGNOSIS — L299 Pruritus, unspecified: Secondary | ICD-10-CM | POA: Diagnosis not present

## 2021-06-21 DIAGNOSIS — D631 Anemia in chronic kidney disease: Secondary | ICD-10-CM | POA: Diagnosis not present

## 2021-06-21 DIAGNOSIS — L299 Pruritus, unspecified: Secondary | ICD-10-CM | POA: Diagnosis not present

## 2021-06-21 DIAGNOSIS — R110 Nausea: Secondary | ICD-10-CM | POA: Diagnosis not present

## 2021-06-21 DIAGNOSIS — N186 End stage renal disease: Secondary | ICD-10-CM | POA: Diagnosis not present

## 2021-06-21 DIAGNOSIS — R52 Pain, unspecified: Secondary | ICD-10-CM | POA: Diagnosis not present

## 2021-06-21 DIAGNOSIS — T7840XA Allergy, unspecified, initial encounter: Secondary | ICD-10-CM | POA: Diagnosis not present

## 2021-06-21 DIAGNOSIS — Z992 Dependence on renal dialysis: Secondary | ICD-10-CM | POA: Diagnosis not present

## 2021-06-21 DIAGNOSIS — R209 Unspecified disturbances of skin sensation: Secondary | ICD-10-CM | POA: Diagnosis not present

## 2021-06-21 DIAGNOSIS — N2581 Secondary hyperparathyroidism of renal origin: Secondary | ICD-10-CM | POA: Diagnosis not present

## 2021-06-22 DIAGNOSIS — D631 Anemia in chronic kidney disease: Secondary | ICD-10-CM | POA: Diagnosis not present

## 2021-06-22 DIAGNOSIS — T7840XA Allergy, unspecified, initial encounter: Secondary | ICD-10-CM | POA: Diagnosis not present

## 2021-06-22 DIAGNOSIS — R52 Pain, unspecified: Secondary | ICD-10-CM | POA: Diagnosis not present

## 2021-06-22 DIAGNOSIS — R209 Unspecified disturbances of skin sensation: Secondary | ICD-10-CM | POA: Diagnosis not present

## 2021-06-22 DIAGNOSIS — N2581 Secondary hyperparathyroidism of renal origin: Secondary | ICD-10-CM | POA: Diagnosis not present

## 2021-06-22 DIAGNOSIS — L299 Pruritus, unspecified: Secondary | ICD-10-CM | POA: Diagnosis not present

## 2021-06-22 DIAGNOSIS — R110 Nausea: Secondary | ICD-10-CM | POA: Diagnosis not present

## 2021-06-22 DIAGNOSIS — Z992 Dependence on renal dialysis: Secondary | ICD-10-CM | POA: Diagnosis not present

## 2021-06-22 DIAGNOSIS — N186 End stage renal disease: Secondary | ICD-10-CM | POA: Diagnosis not present

## 2021-06-26 DIAGNOSIS — R52 Pain, unspecified: Secondary | ICD-10-CM | POA: Diagnosis not present

## 2021-06-26 DIAGNOSIS — N186 End stage renal disease: Secondary | ICD-10-CM | POA: Diagnosis not present

## 2021-06-26 DIAGNOSIS — T7840XA Allergy, unspecified, initial encounter: Secondary | ICD-10-CM | POA: Diagnosis not present

## 2021-06-26 DIAGNOSIS — N2581 Secondary hyperparathyroidism of renal origin: Secondary | ICD-10-CM | POA: Diagnosis not present

## 2021-06-26 DIAGNOSIS — R209 Unspecified disturbances of skin sensation: Secondary | ICD-10-CM | POA: Diagnosis not present

## 2021-06-26 DIAGNOSIS — Z992 Dependence on renal dialysis: Secondary | ICD-10-CM | POA: Diagnosis not present

## 2021-06-26 DIAGNOSIS — L299 Pruritus, unspecified: Secondary | ICD-10-CM | POA: Diagnosis not present

## 2021-06-26 DIAGNOSIS — R110 Nausea: Secondary | ICD-10-CM | POA: Diagnosis not present

## 2021-06-26 DIAGNOSIS — D631 Anemia in chronic kidney disease: Secondary | ICD-10-CM | POA: Diagnosis not present

## 2021-06-28 DIAGNOSIS — Z992 Dependence on renal dialysis: Secondary | ICD-10-CM | POA: Diagnosis not present

## 2021-06-28 DIAGNOSIS — R52 Pain, unspecified: Secondary | ICD-10-CM | POA: Diagnosis not present

## 2021-06-28 DIAGNOSIS — N186 End stage renal disease: Secondary | ICD-10-CM | POA: Diagnosis not present

## 2021-06-28 DIAGNOSIS — T7840XA Allergy, unspecified, initial encounter: Secondary | ICD-10-CM | POA: Diagnosis not present

## 2021-06-28 DIAGNOSIS — R110 Nausea: Secondary | ICD-10-CM | POA: Diagnosis not present

## 2021-06-28 DIAGNOSIS — D631 Anemia in chronic kidney disease: Secondary | ICD-10-CM | POA: Diagnosis not present

## 2021-06-28 DIAGNOSIS — L299 Pruritus, unspecified: Secondary | ICD-10-CM | POA: Diagnosis not present

## 2021-06-28 DIAGNOSIS — N2581 Secondary hyperparathyroidism of renal origin: Secondary | ICD-10-CM | POA: Diagnosis not present

## 2021-06-28 DIAGNOSIS — R209 Unspecified disturbances of skin sensation: Secondary | ICD-10-CM | POA: Diagnosis not present

## 2021-06-30 DIAGNOSIS — T7840XA Allergy, unspecified, initial encounter: Secondary | ICD-10-CM | POA: Diagnosis not present

## 2021-06-30 DIAGNOSIS — Z992 Dependence on renal dialysis: Secondary | ICD-10-CM | POA: Diagnosis not present

## 2021-06-30 DIAGNOSIS — N2581 Secondary hyperparathyroidism of renal origin: Secondary | ICD-10-CM | POA: Diagnosis not present

## 2021-06-30 DIAGNOSIS — R110 Nausea: Secondary | ICD-10-CM | POA: Diagnosis not present

## 2021-06-30 DIAGNOSIS — R52 Pain, unspecified: Secondary | ICD-10-CM | POA: Diagnosis not present

## 2021-06-30 DIAGNOSIS — N186 End stage renal disease: Secondary | ICD-10-CM | POA: Diagnosis not present

## 2021-06-30 DIAGNOSIS — R209 Unspecified disturbances of skin sensation: Secondary | ICD-10-CM | POA: Diagnosis not present

## 2021-06-30 DIAGNOSIS — D631 Anemia in chronic kidney disease: Secondary | ICD-10-CM | POA: Diagnosis not present

## 2021-06-30 DIAGNOSIS — L299 Pruritus, unspecified: Secondary | ICD-10-CM | POA: Diagnosis not present

## 2021-07-03 DIAGNOSIS — D631 Anemia in chronic kidney disease: Secondary | ICD-10-CM | POA: Diagnosis not present

## 2021-07-03 DIAGNOSIS — Z992 Dependence on renal dialysis: Secondary | ICD-10-CM | POA: Diagnosis not present

## 2021-07-03 DIAGNOSIS — R52 Pain, unspecified: Secondary | ICD-10-CM | POA: Diagnosis not present

## 2021-07-03 DIAGNOSIS — L299 Pruritus, unspecified: Secondary | ICD-10-CM | POA: Diagnosis not present

## 2021-07-03 DIAGNOSIS — T7840XA Allergy, unspecified, initial encounter: Secondary | ICD-10-CM | POA: Diagnosis not present

## 2021-07-03 DIAGNOSIS — R209 Unspecified disturbances of skin sensation: Secondary | ICD-10-CM | POA: Diagnosis not present

## 2021-07-03 DIAGNOSIS — N2581 Secondary hyperparathyroidism of renal origin: Secondary | ICD-10-CM | POA: Diagnosis not present

## 2021-07-03 DIAGNOSIS — N186 End stage renal disease: Secondary | ICD-10-CM | POA: Diagnosis not present

## 2021-07-03 DIAGNOSIS — R110 Nausea: Secondary | ICD-10-CM | POA: Diagnosis not present

## 2021-07-05 DIAGNOSIS — R110 Nausea: Secondary | ICD-10-CM | POA: Diagnosis not present

## 2021-07-05 DIAGNOSIS — R52 Pain, unspecified: Secondary | ICD-10-CM | POA: Diagnosis not present

## 2021-07-05 DIAGNOSIS — R209 Unspecified disturbances of skin sensation: Secondary | ICD-10-CM | POA: Diagnosis not present

## 2021-07-05 DIAGNOSIS — L299 Pruritus, unspecified: Secondary | ICD-10-CM | POA: Diagnosis not present

## 2021-07-05 DIAGNOSIS — N186 End stage renal disease: Secondary | ICD-10-CM | POA: Diagnosis not present

## 2021-07-05 DIAGNOSIS — T7840XA Allergy, unspecified, initial encounter: Secondary | ICD-10-CM | POA: Diagnosis not present

## 2021-07-05 DIAGNOSIS — D631 Anemia in chronic kidney disease: Secondary | ICD-10-CM | POA: Diagnosis not present

## 2021-07-05 DIAGNOSIS — Z992 Dependence on renal dialysis: Secondary | ICD-10-CM | POA: Diagnosis not present

## 2021-07-05 DIAGNOSIS — N2581 Secondary hyperparathyroidism of renal origin: Secondary | ICD-10-CM | POA: Diagnosis not present

## 2021-07-06 ENCOUNTER — Telehealth: Payer: Self-pay

## 2021-07-06 ENCOUNTER — Other Ambulatory Visit: Payer: Self-pay

## 2021-07-06 ENCOUNTER — Ambulatory Visit (INDEPENDENT_AMBULATORY_CARE_PROVIDER_SITE_OTHER)
Admission: RE | Admit: 2021-07-06 | Discharge: 2021-07-06 | Disposition: A | Discharge status: Home / Self Care | Payer: Medicare Other | Source: Ambulatory Visit | Attending: Vascular Surgery | Admitting: Vascular Surgery

## 2021-07-06 ENCOUNTER — Ambulatory Visit (HOSPITAL_COMMUNITY)
Admission: RE | Admit: 2021-07-06 | Discharge: 2021-07-06 | Disposition: A | Discharge status: Home / Self Care | Payer: Medicare Other | Source: Ambulatory Visit | Attending: Vascular Surgery | Admitting: Vascular Surgery

## 2021-07-06 DIAGNOSIS — N186 End stage renal disease: Secondary | ICD-10-CM | POA: Insufficient documentation

## 2021-07-06 DIAGNOSIS — Z992 Dependence on renal dialysis: Secondary | ICD-10-CM

## 2021-07-06 NOTE — Telephone Encounter (Signed)
Dr. Johnney Ou called to get pt added to MD schedule this week. Pt has AVG placed in 1/22 and has recently had several percutaneous thrombectomies and has clotted again. Pt is resistant to a catheter and wanting to discuss revision. Pt has been scheduled for vein mapping today and to see MD tomorrow. Dr. Johnney Ou will discuss this with pt and call us back if he does not wish to proceed.  ?

## 2021-07-07 ENCOUNTER — Ambulatory Visit: Payer: Medicare Other | Admitting: Vascular Surgery

## 2021-07-07 DIAGNOSIS — T82868A Thrombosis of vascular prosthetic devices, implants and grafts, initial encounter: Secondary | ICD-10-CM | POA: Diagnosis not present

## 2021-07-07 DIAGNOSIS — R209 Unspecified disturbances of skin sensation: Secondary | ICD-10-CM | POA: Diagnosis not present

## 2021-07-07 DIAGNOSIS — T7840XA Allergy, unspecified, initial encounter: Secondary | ICD-10-CM | POA: Diagnosis not present

## 2021-07-07 DIAGNOSIS — R110 Nausea: Secondary | ICD-10-CM | POA: Diagnosis not present

## 2021-07-07 DIAGNOSIS — N186 End stage renal disease: Secondary | ICD-10-CM | POA: Diagnosis not present

## 2021-07-07 DIAGNOSIS — R52 Pain, unspecified: Secondary | ICD-10-CM | POA: Diagnosis not present

## 2021-07-07 DIAGNOSIS — D631 Anemia in chronic kidney disease: Secondary | ICD-10-CM | POA: Diagnosis not present

## 2021-07-07 DIAGNOSIS — Z992 Dependence on renal dialysis: Secondary | ICD-10-CM | POA: Diagnosis not present

## 2021-07-07 DIAGNOSIS — L299 Pruritus, unspecified: Secondary | ICD-10-CM | POA: Diagnosis not present

## 2021-07-07 DIAGNOSIS — N2581 Secondary hyperparathyroidism of renal origin: Secondary | ICD-10-CM | POA: Diagnosis not present

## 2021-07-10 ENCOUNTER — Telehealth: Payer: Self-pay

## 2021-07-10 DIAGNOSIS — R209 Unspecified disturbances of skin sensation: Secondary | ICD-10-CM | POA: Diagnosis not present

## 2021-07-10 DIAGNOSIS — Z992 Dependence on renal dialysis: Secondary | ICD-10-CM | POA: Diagnosis not present

## 2021-07-10 DIAGNOSIS — R110 Nausea: Secondary | ICD-10-CM | POA: Diagnosis not present

## 2021-07-10 DIAGNOSIS — I671 Cerebral aneurysm, nonruptured: Secondary | ICD-10-CM | POA: Diagnosis not present

## 2021-07-10 DIAGNOSIS — R52 Pain, unspecified: Secondary | ICD-10-CM | POA: Diagnosis not present

## 2021-07-10 DIAGNOSIS — D631 Anemia in chronic kidney disease: Secondary | ICD-10-CM | POA: Diagnosis not present

## 2021-07-10 DIAGNOSIS — N186 End stage renal disease: Secondary | ICD-10-CM | POA: Diagnosis not present

## 2021-07-10 DIAGNOSIS — L299 Pruritus, unspecified: Secondary | ICD-10-CM | POA: Diagnosis not present

## 2021-07-10 DIAGNOSIS — N2581 Secondary hyperparathyroidism of renal origin: Secondary | ICD-10-CM | POA: Diagnosis not present

## 2021-07-10 DIAGNOSIS — T7840XA Allergy, unspecified, initial encounter: Secondary | ICD-10-CM | POA: Diagnosis not present

## 2021-07-10 NOTE — Telephone Encounter (Signed)
Pt called to r/s appt to sooner appt as he wants a declot of his access. I have advised him to speak with Dr. Johnney Ou about that and he was agreeable to this. He will call us back if he wishes to r/s or cancel his upcoming appt.  ?

## 2021-07-13 DIAGNOSIS — R52 Pain, unspecified: Secondary | ICD-10-CM | POA: Diagnosis not present

## 2021-07-13 DIAGNOSIS — D631 Anemia in chronic kidney disease: Secondary | ICD-10-CM | POA: Diagnosis not present

## 2021-07-13 DIAGNOSIS — R110 Nausea: Secondary | ICD-10-CM | POA: Diagnosis not present

## 2021-07-13 DIAGNOSIS — N186 End stage renal disease: Secondary | ICD-10-CM | POA: Diagnosis not present

## 2021-07-13 DIAGNOSIS — Z992 Dependence on renal dialysis: Secondary | ICD-10-CM | POA: Diagnosis not present

## 2021-07-13 DIAGNOSIS — T7840XA Allergy, unspecified, initial encounter: Secondary | ICD-10-CM | POA: Diagnosis not present

## 2021-07-13 DIAGNOSIS — L299 Pruritus, unspecified: Secondary | ICD-10-CM | POA: Diagnosis not present

## 2021-07-13 DIAGNOSIS — N2581 Secondary hyperparathyroidism of renal origin: Secondary | ICD-10-CM | POA: Diagnosis not present

## 2021-07-13 DIAGNOSIS — R209 Unspecified disturbances of skin sensation: Secondary | ICD-10-CM | POA: Diagnosis not present

## 2021-07-16 DIAGNOSIS — Z992 Dependence on renal dialysis: Secondary | ICD-10-CM | POA: Diagnosis not present

## 2021-07-16 DIAGNOSIS — N186 End stage renal disease: Secondary | ICD-10-CM | POA: Diagnosis not present

## 2021-07-17 DIAGNOSIS — Z992 Dependence on renal dialysis: Secondary | ICD-10-CM | POA: Diagnosis not present

## 2021-07-17 DIAGNOSIS — D631 Anemia in chronic kidney disease: Secondary | ICD-10-CM | POA: Diagnosis not present

## 2021-07-17 DIAGNOSIS — R52 Pain, unspecified: Secondary | ICD-10-CM | POA: Diagnosis not present

## 2021-07-17 DIAGNOSIS — R209 Unspecified disturbances of skin sensation: Secondary | ICD-10-CM | POA: Diagnosis not present

## 2021-07-17 DIAGNOSIS — D689 Coagulation defect, unspecified: Secondary | ICD-10-CM | POA: Diagnosis not present

## 2021-07-17 DIAGNOSIS — N186 End stage renal disease: Secondary | ICD-10-CM | POA: Diagnosis not present

## 2021-07-17 DIAGNOSIS — T7840XA Allergy, unspecified, initial encounter: Secondary | ICD-10-CM | POA: Diagnosis not present

## 2021-07-17 DIAGNOSIS — L299 Pruritus, unspecified: Secondary | ICD-10-CM | POA: Diagnosis not present

## 2021-07-17 DIAGNOSIS — N2581 Secondary hyperparathyroidism of renal origin: Secondary | ICD-10-CM | POA: Diagnosis not present

## 2021-07-19 DIAGNOSIS — D631 Anemia in chronic kidney disease: Secondary | ICD-10-CM | POA: Diagnosis not present

## 2021-07-19 DIAGNOSIS — D689 Coagulation defect, unspecified: Secondary | ICD-10-CM | POA: Diagnosis not present

## 2021-07-19 DIAGNOSIS — N186 End stage renal disease: Secondary | ICD-10-CM | POA: Diagnosis not present

## 2021-07-19 DIAGNOSIS — R52 Pain, unspecified: Secondary | ICD-10-CM | POA: Diagnosis not present

## 2021-07-19 DIAGNOSIS — T7840XA Allergy, unspecified, initial encounter: Secondary | ICD-10-CM | POA: Diagnosis not present

## 2021-07-19 DIAGNOSIS — R209 Unspecified disturbances of skin sensation: Secondary | ICD-10-CM | POA: Diagnosis not present

## 2021-07-19 DIAGNOSIS — L299 Pruritus, unspecified: Secondary | ICD-10-CM | POA: Diagnosis not present

## 2021-07-19 DIAGNOSIS — N2581 Secondary hyperparathyroidism of renal origin: Secondary | ICD-10-CM | POA: Diagnosis not present

## 2021-07-19 DIAGNOSIS — Z992 Dependence on renal dialysis: Secondary | ICD-10-CM | POA: Diagnosis not present

## 2021-07-21 DIAGNOSIS — R209 Unspecified disturbances of skin sensation: Secondary | ICD-10-CM | POA: Diagnosis not present

## 2021-07-21 DIAGNOSIS — N186 End stage renal disease: Secondary | ICD-10-CM | POA: Diagnosis not present

## 2021-07-21 DIAGNOSIS — D631 Anemia in chronic kidney disease: Secondary | ICD-10-CM | POA: Diagnosis not present

## 2021-07-21 DIAGNOSIS — N2581 Secondary hyperparathyroidism of renal origin: Secondary | ICD-10-CM | POA: Diagnosis not present

## 2021-07-21 DIAGNOSIS — R52 Pain, unspecified: Secondary | ICD-10-CM | POA: Diagnosis not present

## 2021-07-21 DIAGNOSIS — L299 Pruritus, unspecified: Secondary | ICD-10-CM | POA: Diagnosis not present

## 2021-07-21 DIAGNOSIS — T7840XA Allergy, unspecified, initial encounter: Secondary | ICD-10-CM | POA: Diagnosis not present

## 2021-07-21 DIAGNOSIS — D689 Coagulation defect, unspecified: Secondary | ICD-10-CM | POA: Diagnosis not present

## 2021-07-21 DIAGNOSIS — Z992 Dependence on renal dialysis: Secondary | ICD-10-CM | POA: Diagnosis not present

## 2021-07-24 DIAGNOSIS — D689 Coagulation defect, unspecified: Secondary | ICD-10-CM | POA: Diagnosis not present

## 2021-07-24 DIAGNOSIS — L299 Pruritus, unspecified: Secondary | ICD-10-CM | POA: Diagnosis not present

## 2021-07-24 DIAGNOSIS — R209 Unspecified disturbances of skin sensation: Secondary | ICD-10-CM | POA: Diagnosis not present

## 2021-07-24 DIAGNOSIS — T7840XA Allergy, unspecified, initial encounter: Secondary | ICD-10-CM | POA: Diagnosis not present

## 2021-07-24 DIAGNOSIS — N186 End stage renal disease: Secondary | ICD-10-CM | POA: Diagnosis not present

## 2021-07-24 DIAGNOSIS — R52 Pain, unspecified: Secondary | ICD-10-CM | POA: Diagnosis not present

## 2021-07-24 DIAGNOSIS — Z992 Dependence on renal dialysis: Secondary | ICD-10-CM | POA: Diagnosis not present

## 2021-07-24 DIAGNOSIS — D631 Anemia in chronic kidney disease: Secondary | ICD-10-CM | POA: Diagnosis not present

## 2021-07-24 DIAGNOSIS — N2581 Secondary hyperparathyroidism of renal origin: Secondary | ICD-10-CM | POA: Diagnosis not present

## 2021-07-26 ENCOUNTER — Other Ambulatory Visit: Payer: Self-pay

## 2021-07-26 ENCOUNTER — Encounter (HOSPITAL_BASED_OUTPATIENT_CLINIC_OR_DEPARTMENT_OTHER): Payer: Self-pay | Admitting: Emergency Medicine

## 2021-07-26 ENCOUNTER — Emergency Department (HOSPITAL_BASED_OUTPATIENT_CLINIC_OR_DEPARTMENT_OTHER)
Admission: EM | Admit: 2021-07-26 | Discharge: 2021-07-26 | Disposition: A | Discharge status: Home / Self Care | Payer: Medicare Other | Attending: Emergency Medicine | Admitting: Emergency Medicine

## 2021-07-26 DIAGNOSIS — I120 Hypertensive chronic kidney disease with stage 5 chronic kidney disease or end stage renal disease: Secondary | ICD-10-CM | POA: Diagnosis not present

## 2021-07-26 DIAGNOSIS — L299 Pruritus, unspecified: Secondary | ICD-10-CM | POA: Diagnosis not present

## 2021-07-26 DIAGNOSIS — N2581 Secondary hyperparathyroidism of renal origin: Secondary | ICD-10-CM | POA: Diagnosis not present

## 2021-07-26 DIAGNOSIS — R52 Pain, unspecified: Secondary | ICD-10-CM | POA: Diagnosis not present

## 2021-07-26 DIAGNOSIS — R209 Unspecified disturbances of skin sensation: Secondary | ICD-10-CM | POA: Diagnosis not present

## 2021-07-26 DIAGNOSIS — K047 Periapical abscess without sinus: Secondary | ICD-10-CM | POA: Diagnosis not present

## 2021-07-26 DIAGNOSIS — T7840XA Allergy, unspecified, initial encounter: Secondary | ICD-10-CM | POA: Diagnosis not present

## 2021-07-26 DIAGNOSIS — K0889 Other specified disorders of teeth and supporting structures: Secondary | ICD-10-CM

## 2021-07-26 DIAGNOSIS — N186 End stage renal disease: Secondary | ICD-10-CM | POA: Diagnosis not present

## 2021-07-26 DIAGNOSIS — D689 Coagulation defect, unspecified: Secondary | ICD-10-CM | POA: Diagnosis not present

## 2021-07-26 DIAGNOSIS — D631 Anemia in chronic kidney disease: Secondary | ICD-10-CM | POA: Diagnosis not present

## 2021-07-26 DIAGNOSIS — Z992 Dependence on renal dialysis: Secondary | ICD-10-CM | POA: Diagnosis not present

## 2021-07-26 MED ORDER — OXYCODONE-ACETAMINOPHEN 5-325 MG PO TABS
2.0000 | ORAL_TABLET | Freq: Once | ORAL | Status: AC
  Administered 2021-07-26: 2 via ORAL
  Filled 2021-07-26: qty 2

## 2021-07-26 MED ORDER — AMOXICILLIN-POT CLAVULANATE 875-125 MG PO TABS: 1.0000 | ORAL_TABLET | Freq: Two times a day (BID) | ORAL | 0 refills | Status: DC

## 2021-07-26 MED ORDER — LIDOCAINE HCL (PF) 1 % IJ SOLN: 30.0000 mL | Freq: Once | INTRAMUSCULAR | Status: DC

## 2021-07-26 MED ORDER — BUPIVACAINE-EPINEPHRINE (PF) 0.5% -1:200000 IJ SOLN
20.0000 mL | Freq: Once | INTRAMUSCULAR | Status: AC
Start: 2021-07-26 — End: 2021-07-26
  Administered 2021-07-26: 20 mL
  Filled 2021-07-26: qty 21.6

## 2021-07-26 MED ORDER — OXYCODONE-ACETAMINOPHEN 5-325 MG PO TABS: 1.0000 | ORAL_TABLET | Freq: Four times a day (QID) | ORAL | 0 refills | Status: DC | PRN

## 2021-07-26 NOTE — Discharge Instructions (Signed)
You can use Tylenol up to 1000 mg every 6 hours for pain, you can use the Percocet I am prescribing in place of Tylenol as needed for breakthrough pain to stay ahead of your severe dental pain.  Please follow-up with your dentist tomorrow morning as you have already planned.  Please take the antibiotics I am prescribing with some food in your stomach twice daily. ?

## 2021-07-26 NOTE — ED Provider Notes (Signed)
?[facility]PT ?Provider Note ? ? ?CSN: [id number] ?Arrival date & time: 07/26/21  1335 ? ?  ? ?History ? ?Chief Complaint  ?Patient presents with  ? Dental Pain  ? ? ?Michael [name] is a 46 y.o. male with past medical history significant for ESRD on dialysis, Polycystic kidney disease, anemia of chronic disease who presents with concern for left-sided dental pain.  Patient reports that he has had some yellowish drainage from the left side of the mouth for around a week, significant pain started around 3 AM this morning.  Patient reports that he has a dentist appointment scheduled for early tomorrow morning however he is in excruciating pain today and needed some relief.  Patient also reports some feeling of swelling of the lymph nodes on the left side, as well as chills without objective fever. ? ? ?Dental Pain ? ?  ? ?Home Medications ?Prior to Admission medications   ?Medication Sig Start Date End Date Taking? Authorizing Provider  ?amoxicillin-clavulanate (AUGMENTIN) 875-125 MG tablet Take 1 tablet by mouth every 12 (twelve) hours. 07/26/21  Yes Kateryn Marasigan H, PA-C  ?oxyCODONE-acetaminophen (PERCOCET/ROXICET) 5-325 MG tablet Take 1 tablet by mouth every 6 (six) hours as needed for severe pain. 07/26/21  Yes Gwendalyn Mcgonagle H, PA-C  ?acetaminophen (TYLENOL) 500 MG tablet Take 1,000 mg by mouth every 6 (six) hours as needed for mild pain.    [provider]  ?calcium acetate (PHOSLO) 667 MG capsule Take 667-2,001 mg by mouth See admin instructions. Take 1 capsule (667 mg) by mouth with large snacks & take 3 capsules (2001 mg) by mouth with each meal 12/12/20   [provider]  ?lidocaine-prilocaine (EMLA) cream Apply 1 application topically every Monday, Wednesday, and Friday with hemodialysis. Prior to site being accessed before dialysis    [provider]  ?Multiple Vitamin (MULTIVITAMIN WITH MINERALS) TABS tablet Take 1 tablet by mouth in the morning.     [provider]  ?oxyCODONE (OXY IR/ROXICODONE) 5 MG immediate release tablet Take 1 tablet (5 mg total) by mouth every 6 (six) hours as needed for severe pain. 05/29/21   Mansouraty, Telford Nab., MD  ?polyethylene glycol (MIRALAX) 17 g packet Take 17 g by mouth daily. ?Patient taking differently: Take 17 g by mouth daily as needed (constipation.). 02/23/21   Sharyn Creamer, MD  ?PRESCRIPTION MEDICATION Inhale into the lungs at bedtime. CPAP    [provider]  ?ticagrelor (BRILINTA) 90 MG TABS tablet Take 1 tablet (90 mg total) by mouth 2 (two) times daily. 05/31/21   Mansouraty, Telford Nab., MD  ?isosorbide dinitrate (ISORDIL) 10 MG tablet Take 1 tablet (10 mg total) by mouth 2 (two) times daily. ?Patient not taking: No sig reported 02/04/20 03/24/20  [name], FNP  ?   ? ?Allergies    ?Ivp dye [iodinated contrast media]   ? ?Review of Systems   ?Review of Systems  ?HENT:  Positive for dental problem.   ?All other systems reviewed and are negative. ? ?Physical Exam ?Updated Vital Signs ?BP (!) 157/113   Pulse 91   Temp 98.1 ?F (36.7 ?C) (Oral)   Resp (!) 22   SpO2 100%  ?Physical Exam ?Vitals and nursing note reviewed.  ?Constitutional:   ?   General: He is not in acute distress. ?   Appearance: Normal appearance.  ?HENT:  ?   Head: Normocephalic and atraumatic.  ?   Mouth/Throat:  ? ?   Comments: Poor dentition noted on  tooth 17, 18 on left, some swelling of the surrounding buccal mucosa without palpable  or fluctuant abscess.  Uvula midline, tonsils 1+ bilaterally without any evidence of peritonsillar abscess.  No floor of mouth swelling or redness. ?Eyes:  ?   General:     ?   Right eye: No discharge.     ?   Left eye: No discharge.  ?Cardiovascular:  ?   Rate and Rhythm: Normal rate and regular rhythm.  ?Pulmonary:  ?   Effort: Pulmonary effort is normal. No respiratory distress.  ?Musculoskeletal:     ?   General: No deformity.  ?Skin: ?   General: Skin is warm and dry.   ?Neurological:  ?   Mental Status: He is alert and oriented to person, place, and time.  ?Psychiatric:     ?   Mood and Affect: Mood normal.     ?   Behavior: Behavior normal.  ? ? ?ED Results / Procedures / Treatments   ?Labs ?(all labs ordered are listed, but only abnormal results are displayed) ?Labs Reviewed - No data to display ? ?EKG ?None ? ?Radiology ?No results found. ? ?Procedures ?.Nerve Block ? ?Date/Time: 07/26/2021 3:03 PM ?Performed by: Anselmo Pickler, PA-C ?Authorized by: Anselmo Pickler, PA-C  ? ?Consent:  ?  Consent obtained:  Verbal ?  Consent given by:  Patient ?  Risks, benefits, and alternatives were discussed: yes   ?  Risks discussed:  Infection, nerve damage and unsuccessful block ?  Alternatives discussed:  No treatment ?Universal protocol:  ?  Procedure explained and questions answered to patient or proxy's satisfaction: yes   ?  Patient identity confirmed:  Verbally with patient ?Indications:  ?  Indications:  Pain relief ?Location:  ?  Body area:  Head ?  Head nerve blocked: inferior alveolar block. ?  Laterality:  Left ?Procedure details:  ?  Block needle gauge:  25 G ?  Anesthetic injected:  Bupivacaine 0.5% WITH epi ?  Steroid injected:  None ?  Injection procedure:  Anatomic landmarks identified, introduced needle and negative aspiration for blood ?Post-procedure details:  ?  Outcome:  Anesthesia achieved ?  Procedure completion:  Tolerated  ? ? ?Medications Ordered in ED ?Medications  ?oxyCODONE-acetaminophen (PERCOCET/ROXICET) 5-325 MG per tablet 2 tablet (2 tablets Oral Given 07/26/21 1523)  ?bupivacaine-epinephrine (PF) (MARCAINE W/ EPI) 0.5% -1:200000 injection 20 mL (20 mLs Infiltration Given by Other 07/26/21 1525)  ? ? ?ED Course/ Medical Decision Making/ A&P ?  ?                        ?Medical Decision Making ?Risk ?Prescription drug management. ? ? ?This patient presents to the ED for concern of left-sided dental pain, purulent drainage, this involves an  extensive number of treatment options, and is a complaint that carries with it a high risk of complications and morbidity. The emergent differential diagnosis prior to evaluation includes, but is not limited to, dental abscess, infection, Ludwig angina, peritonsillar abscess, other ENT emergencies versus other.  ? ?This is not an exhaustive differential.  ? ?Past Medical History / Co-morbidities / Social History: ? ESRD on dialysis, Polycystic kidney disease, anemia of chronic disease ? ?Additional history: ?Chart reviewed. Pertinent results include: Previous lab work, imaging from emergency department visits, outpatient gastroenterology visits, PCP visits ? ?Physical Exam: ?Physical exam performed. The pertinent findings include: Patient with noted dental caries, foul smell, some minimal swelling of the buccal mucosa  on the left consistent with pain due to dental caries, early dental infection.  It showed no evidence of dental abscess, peritonsillar abscess or Ludwig angina on physical exam.  Patient is afebrile, nontachycardic on arrival.  He was in significant pain, distress ?  ?Medications: ?I ordered medication including bupivacaine for infra alveolar nerve block on the left.  Please see procedure note above.  Nerve block was achieved.  Reevaluation of the patient after these medicines showed that the patient improved. I have reviewed the patients home medicines and have made adjustments as needed.  We will discharge with very short course of pain medication, as well as Augmentin to help patient bridge until his dentist appointment. ?  ?Disposition: ?After consideration of the diagnostic results and the patients response to treatment, I feel that patient's pain is consistent with pain secondary to severe dental caries, he has follow-up with dentistry already.  We will treat for possible developing infection, and to control patient's pain.  Encourage follow-up with dentistry.  Return precautions to the emergency  department include worsening fever, pain, difficulty swallowing. ? ?I discussed this case with my attending physician Dr. Armandina Gemma who cosigned this note including patient's presenting symptoms, physical exam, and planned

## 2021-07-26 NOTE — ED Notes (Signed)
RT note: Pt. placed in rm. #4, placed on pox/bp updated, given blankets and made aware of call bell.  ?

## 2021-07-26 NOTE — ED Triage Notes (Signed)
Pt states he has left wisdom tooth that feels infected, left side lymph nodes swollen.  ?

## 2021-07-28 ENCOUNTER — Encounter: Payer: Self-pay | Admitting: Nurse Practitioner

## 2021-07-28 ENCOUNTER — Ambulatory Visit (INDEPENDENT_AMBULATORY_CARE_PROVIDER_SITE_OTHER): Payer: Medicare Other | Admitting: Nurse Practitioner

## 2021-07-28 VITALS — BP 146/114 | HR 103 | Temp 97.5°F | Ht 73.0 in | Wt 257.5 lb

## 2021-07-28 DIAGNOSIS — D631 Anemia in chronic kidney disease: Secondary | ICD-10-CM | POA: Diagnosis not present

## 2021-07-28 DIAGNOSIS — Z992 Dependence on renal dialysis: Secondary | ICD-10-CM | POA: Diagnosis not present

## 2021-07-28 DIAGNOSIS — L299 Pruritus, unspecified: Secondary | ICD-10-CM | POA: Diagnosis not present

## 2021-07-28 DIAGNOSIS — T7840XA Allergy, unspecified, initial encounter: Secondary | ICD-10-CM | POA: Diagnosis not present

## 2021-07-28 DIAGNOSIS — D689 Coagulation defect, unspecified: Secondary | ICD-10-CM | POA: Diagnosis not present

## 2021-07-28 DIAGNOSIS — K0889 Other specified disorders of teeth and supporting structures: Secondary | ICD-10-CM

## 2021-07-28 DIAGNOSIS — R209 Unspecified disturbances of skin sensation: Secondary | ICD-10-CM | POA: Diagnosis not present

## 2021-07-28 DIAGNOSIS — N186 End stage renal disease: Secondary | ICD-10-CM | POA: Diagnosis not present

## 2021-07-28 DIAGNOSIS — N2581 Secondary hyperparathyroidism of renal origin: Secondary | ICD-10-CM | POA: Diagnosis not present

## 2021-07-28 DIAGNOSIS — R52 Pain, unspecified: Secondary | ICD-10-CM | POA: Diagnosis not present

## 2021-07-28 MED ORDER — OXYCODONE-ACETAMINOPHEN 5-325 MG PO TABS: 1.0000 | ORAL_TABLET | Freq: Four times a day (QID) | ORAL | 0 refills | Status: DC | PRN

## 2021-07-28 NOTE — Progress Notes (Signed)
? ?[facility] ?[place],   [address] ?Phone:  [phone]   Fax:  [phone] ?Subjective:  ? Patient ID: Michael [name], male    DOB: 09/12/1975, 46 y.o.   MRN: [id number] ? ?Chief Complaint  ?Patient presents with  ? OFFICE VISIT  ?  Pt is here to have his dental for fill out. Pt stated he has no appetite pt is requesting refill on oxycodone   ? ?HPI ?Michael [name] 46 y.o. male  has a past medical history of Gout, History of renal dialysis, HLD (hyperlipidemia), Hypertension, Pneumonia, Polycystic kidney disease, Sleep apnea (07/29/2018), and Vitamin D deficiency (11/2018). To the [facility] for dental pain and refill of medications. ? ?Patient states that he needs to have wisdom tooth extraction completed next week and requires PCP approval. States that approval and instructions needed due to history of ESRD and brilinta. Also requesting medication refill for pain management until completion of extraction on Tuesday. Currently rates pain 10/10 and describes as throbbing.  ? ?Patient to have dialysis completed today. Denies any other concerns today. Denies any fatigue, chest pain, shortness of breath, HA or dizziness. Denies any blurred vision, numbness or tingling. ? ?Past Medical History:  ?Diagnosis Date  ? Gout   ? History of renal dialysis   ? M-W-F  ? HLD (hyperlipidemia)   ? Hypertension   ? Pneumonia   ? Polycystic kidney disease   ? Sleep apnea 07/29/2018  ? uses cpap nightly  ? Vitamin D deficiency 11/2018  ? ? ?Past Surgical History:  ?Procedure Laterality Date  ? AV FISTULA PLACEMENT Left 03/24/2020  ? Procedure: INSERTION OF LEFT UPPER EXTREMITY ARTERIOVENOUS (AV) GORE-TEX GRAFT;  Surgeon: [name], MD;  Location: [facility] OR;  Service: Vascular;  Laterality: Left;  PERIPHERAL NERVE BLOCK  ? BASCILIC VEIN TRANSPOSITION Left 12/11/2019  ? Procedure: 1ST STAGE BASILIC TRANSPOSITION OF LEFT ARM;  Surgeon: Angelia Mould, MD;  Location: [facility];  Service:  Vascular;  Laterality: Left;  ? BIOPSY  04/06/2021  ? Procedure: BIOPSY;  Surgeon: Thornton Park, MD;  Location: [facility];  Service: Gastroenterology;;  ? COLONOSCOPY WITH PROPOFOL N/A 04/06/2021  ? Procedure: COLONOSCOPY WITH PROPOFOL;  Surgeon: Thornton Park, MD;  Location: [facility];  Service: Gastroenterology;  Laterality: N/A;  ? COLONOSCOPY WITH PROPOFOL N/A 05/29/2021  ? Procedure: COLONOSCOPY WITH PROPOFOL;  Surgeon: Mansouraty, Telford Nab., MD;  Location: [facility];  Service: Gastroenterology;  Laterality: N/A;  ? DG AV DIALYSIS GRAFT DECLOT OR Left 05/17/2020  ? ENDOSCOPIC MUCOSAL RESECTION  05/29/2021  ? Procedure: ENDOSCOPIC MUCOSAL RESECTION;  Surgeon: Rush Landmark Telford Nab., MD;  Location: [facility];  Service: Gastroenterology;;  ? HEMOSTASIS CLIP PLACEMENT  05/29/2021  ? Procedure: HEMOSTASIS CLIP PLACEMENT;  Surgeon: Irving Copas., MD;  Location: [facility];  Service: Gastroenterology;;  ? IR FLUORO GUIDE CV LINE RIGHT  12/09/2019  ? IR US GUIDE VASC ACCESS RIGHT  12/09/2019  ? LEFT HEART CATH AND CORONARY ANGIOGRAPHY N/A 12/09/2019  ? Procedure: LEFT HEART CATH AND CORONARY ANGIOGRAPHY;  Surgeon: Adrian Prows, MD;  Location: [facility];  Service: Cardiovascular;  Laterality: N/A;  ? POLYPECTOMY  04/06/2021  ? Procedure: POLYPECTOMY;  Surgeon: Thornton Park, MD;  Location: [facility];  Service: Gastroenterology;;  ? POLYPECTOMY  05/29/2021  ? Procedure: POLYPECTOMY;  Surgeon: Rush Landmark Telford Nab., MD;  Location: [facility];  Service: Gastroenterology;;  ? SUBMUCOSAL INJECTION  05/29/2021  ? Procedure: SUBMUCOSAL INJECTION;  Surgeon: Justice Britain  Brooke Bonito., MD;  Location: [facility];  Service: Gastroenterology;;  EPI  ? SUBMUCOSAL LIFTING INJECTION  05/29/2021  ? Procedure: SUBMUCOSAL LIFTING INJECTION;  Surgeon: Irving Copas., MD;  Location: [facility];  Service: Gastroenterology;;  [place]  ? SUBMUCOSAL TATTOO INJECTION  04/06/2021  ?  Procedure: SUBMUCOSAL TATTOO INJECTION;  Surgeon: Thornton Park, MD;  Location: [facility];  Service: Gastroenterology;;  ? ? ?Family History  ?Problem Relation Age of Onset  ? Diabetes Mother   ? Ovarian cancer Mother   ? Bipolar disorder Father   ? Hypertension Father   ? Prostate cancer Father   ? Prostate cancer Paternal Grandfather   ? Polycystic kidney disease Neg Hx   ? ? ?Social History  ? ?Socioeconomic History  ? Marital status: Single  ?  Spouse name: Not on file  ? Number of children: 1  ? Years of education: Not on file  ? Highest education level: Not on file  ?Occupational History  ? Occupation: [employer]  ? Occupation: disabled  ?Tobacco Use  ? Smoking status: Former  ?  Packs/day: 0.20  ?  Years: 10.00  ?  Pack years: 2.00  ?  Types: Cigarettes  ?  Quit date: 2015  ?  Years since quitting: 8.3  ? Smokeless tobacco: Never  ?Vaping Use  ? Vaping Use: Never used  ?Substance and Sexual Activity  ? Alcohol use: Yes  ?  Comment: occasional wine  ? Drug use: No  ? Sexual activity: Yes  ?Other Topics Concern  ? Not on file  ?Social History Narrative  ? Not on file  ? ?Social Determinants of Health  ? ?Financial Resource Strain: High Risk  ? Difficulty of Paying Living Expenses: Very hard  ?Food Insecurity: No Food Insecurity  ? Worried About Charity fundraiser in the Last Year: Never true  ? Ran Out of Food in the Last Year: Never true  ?Transportation Needs: No Transportation Needs  ? Lack of Transportation (Medical): No  ? Lack of Transportation (Non-Medical): No  ?Physical Activity: Insufficiently Active  ? Days of Exercise per Week: 3 days  ? Minutes of Exercise per Session: 30 min  ?Stress: Stress Concern Present  ? Feeling of Stress : To some extent  ?Social Connections: Moderately Integrated  ? Frequency of Communication with Friends and Family: More than three times a week  ? Frequency of Social Gatherings with Friends and Family: Once a week  ? Attends Religious Services: 1 to 4 times per  year  ? Active Member of Clubs or Organizations: Yes  ? Attends Archivist Meetings: 1 to 4 times per year  ? Marital Status: Never married  ?Intimate Partner Violence: Not At Risk  ? Fear of Current or Ex-Partner: No  ? Emotionally Abused: No  ? Physically Abused: No  ? Sexually Abused: No  ? ? ?Outpatient Medications Prior to Visit  ?Medication Sig Dispense Refill  ? acetaminophen (TYLENOL) 500 MG tablet Take 1,000 mg by mouth every 6 (six) hours as needed for mild pain.    ? Multiple Vitamin (MULTIVITAMIN WITH MINERALS) TABS tablet Take 1 tablet by mouth in the morning.    ? oxyCODONE (OXY IR/ROXICODONE) 5 MG immediate release tablet Take 1 tablet (5 mg total) by mouth every 6 (six) hours as needed for severe pain. 6 tablet 0  ? polyethylene glycol (MIRALAX) 17 g packet Take 17 g by mouth daily. (Patient taking differently: Take 17 g by mouth daily as needed (constipation.).)  14 each 0  ? PRESCRIPTION MEDICATION Inhale into the lungs at bedtime. CPAP    ? ticagrelor (BRILINTA) 90 MG TABS tablet Take 1 tablet (90 mg total) by mouth 2 (two) times daily. 60 tablet 0  ? oxyCODONE-acetaminophen (PERCOCET/ROXICET) 5-325 MG tablet Take 1 tablet by mouth every 6 (six) hours as needed for severe pain. 5 tablet 0  ? amoxicillin-clavulanate (AUGMENTIN) 875-125 MG tablet Take 1 tablet by mouth every 12 (twelve) hours. (Patient not taking: Reported on 07/28/2021) 14 tablet 0  ? calcium acetate (PHOSLO) 667 MG capsule Take 667-2,001 mg by mouth See admin instructions. Take 1 capsule (667 mg) by mouth with large snacks & take 3 capsules (2001 mg) by mouth with each meal (Patient not taking: Reported on 07/28/2021)    ? lidocaine-prilocaine (EMLA) cream Apply 1 application topically every Monday, Wednesday, and Friday with hemodialysis. Prior to site being accessed before dialysis (Patient not taking: Reported on 07/28/2021)    ? ?No facility-administered medications prior to visit.  ? ? ?Allergies  ?Allergen Reactions  ?  Ivp Dye [Iodinated Contrast Media] Itching  ? ? ?Review of Systems  ?Constitutional:  Negative for chills, fever and malaise/fatigue.  ?HENT:  Negative for congestion, ear discharge, ear pain, hearing loss, nosebleed

## 2021-07-28 NOTE — Patient Instructions (Signed)
You were seen today in the [facility] for dental pain and medication refill.  You were prescribed medications, please take as directed. Please follow up as needed  ?

## 2021-07-31 DIAGNOSIS — N2581 Secondary hyperparathyroidism of renal origin: Secondary | ICD-10-CM | POA: Diagnosis not present

## 2021-07-31 DIAGNOSIS — D689 Coagulation defect, unspecified: Secondary | ICD-10-CM | POA: Diagnosis not present

## 2021-07-31 DIAGNOSIS — Z992 Dependence on renal dialysis: Secondary | ICD-10-CM | POA: Diagnosis not present

## 2021-07-31 DIAGNOSIS — N186 End stage renal disease: Secondary | ICD-10-CM | POA: Diagnosis not present

## 2021-07-31 DIAGNOSIS — L299 Pruritus, unspecified: Secondary | ICD-10-CM | POA: Diagnosis not present

## 2021-07-31 DIAGNOSIS — D631 Anemia in chronic kidney disease: Secondary | ICD-10-CM | POA: Diagnosis not present

## 2021-07-31 DIAGNOSIS — R52 Pain, unspecified: Secondary | ICD-10-CM | POA: Diagnosis not present

## 2021-07-31 DIAGNOSIS — T7840XA Allergy, unspecified, initial encounter: Secondary | ICD-10-CM | POA: Diagnosis not present

## 2021-07-31 DIAGNOSIS — R209 Unspecified disturbances of skin sensation: Secondary | ICD-10-CM | POA: Diagnosis not present

## 2021-08-01 ENCOUNTER — Ambulatory Visit (HOSPITAL_BASED_OUTPATIENT_CLINIC_OR_DEPARTMENT_OTHER): Payer: Medicare Other | Admitting: Nurse Practitioner

## 2021-08-02 DIAGNOSIS — Z992 Dependence on renal dialysis: Secondary | ICD-10-CM | POA: Diagnosis not present

## 2021-08-02 DIAGNOSIS — R209 Unspecified disturbances of skin sensation: Secondary | ICD-10-CM | POA: Diagnosis not present

## 2021-08-02 DIAGNOSIS — L299 Pruritus, unspecified: Secondary | ICD-10-CM | POA: Diagnosis not present

## 2021-08-02 DIAGNOSIS — N2581 Secondary hyperparathyroidism of renal origin: Secondary | ICD-10-CM | POA: Diagnosis not present

## 2021-08-02 DIAGNOSIS — N186 End stage renal disease: Secondary | ICD-10-CM | POA: Diagnosis not present

## 2021-08-02 DIAGNOSIS — T7840XA Allergy, unspecified, initial encounter: Secondary | ICD-10-CM | POA: Diagnosis not present

## 2021-08-02 DIAGNOSIS — R52 Pain, unspecified: Secondary | ICD-10-CM | POA: Diagnosis not present

## 2021-08-02 DIAGNOSIS — D631 Anemia in chronic kidney disease: Secondary | ICD-10-CM | POA: Diagnosis not present

## 2021-08-02 DIAGNOSIS — D689 Coagulation defect, unspecified: Secondary | ICD-10-CM | POA: Diagnosis not present

## 2021-08-03 NOTE — Progress Notes (Signed)
Office Note     CC:  AVG malfunction - AVG clotted again after several thrombectomies.  Requesting Provider:  [name], FNP  HPI: Rubin Dais is a 46 y.o. (07-Oct-1975) male presenting at the request of Dr. Johnney Ou, for AV graft malfunction. Patient has had multiple upper extremity permanent HD access attempts including 11/2019 left brachiobasilic fistula creation complicated by thrombosis, 03/2020 left upper extremity AV graft now thrombosed after multiple declots.  He is currently being dialyzed via a right-sided tunneled HD line.  On exam, Etuin was doing well.  He had no complaints.  He does not want access in his right arm as he is right arm dominant.  He would like to pursue any and all options available in the left arm.  His previous surgeries have left him with some paresthesias in the left forearm.   Currently on the transplant list for the last year and a half.  Past Medical History:  Diagnosis Date   Gout    History of renal dialysis    M-W-F   HLD (hyperlipidemia)    Hypertension    Pneumonia    Polycystic kidney disease    Sleep apnea 07/29/2018   uses cpap nightly   Vitamin D deficiency 11/2018    Past Surgical History:  Procedure Laterality Date   AV FISTULA PLACEMENT Left 03/24/2020   Procedure: INSERTION OF LEFT UPPER EXTREMITY ARTERIOVENOUS (AV) GORE-TEX GRAFT;  Surgeon: Cherre Jehad Bisono, MD;  Location: [facility];  Service: Vascular;  Laterality: Left;  PERIPHERAL NERVE BLOCK   [place] Left 12/11/2019   Procedure: 1ST STAGE BASILIC TRANSPOSITION OF LEFT ARM;  Surgeon: Angelia Mould, MD;  Location: [facility];  Service: Vascular;  Laterality: Left;   BIOPSY  04/06/2021   Procedure: BIOPSY;  Surgeon: Thornton Park, MD;  Location: [facility];  Service: Gastroenterology;;   COLONOSCOPY WITH PROPOFOL N/A 04/06/2021   Procedure: COLONOSCOPY WITH PROPOFOL;  Surgeon: Thornton Park, MD;  Location: [facility];  Service:  Gastroenterology;  Laterality: N/A;   COLONOSCOPY WITH PROPOFOL N/A 05/29/2021   Procedure: COLONOSCOPY WITH PROPOFOL;  Surgeon: Rush Landmark Telford Nab., MD;  Location: [facility];  Service: Gastroenterology;  Laterality: N/A;   DG AV DIALYSIS GRAFT DECLOT OR Left 05/17/2020   ENDOSCOPIC MUCOSAL RESECTION  05/29/2021   Procedure: ENDOSCOPIC MUCOSAL RESECTION;  Surgeon: Rush Landmark Telford Nab., MD;  Location: [facility];  Service: Gastroenterology;;   HEMOSTASIS CLIP PLACEMENT  05/29/2021   Procedure: HEMOSTASIS CLIP PLACEMENT;  Surgeon: Irving Copas., MD;  Location: [facility];  Service: Gastroenterology;;   IR FLUORO GUIDE CV LINE RIGHT  12/09/2019   IR US GUIDE VASC ACCESS RIGHT  12/09/2019   LEFT HEART CATH AND CORONARY ANGIOGRAPHY N/A 12/09/2019   Procedure: LEFT HEART CATH AND CORONARY ANGIOGRAPHY;  Surgeon: Adrian Prows, MD;  Location: [facility];  Service: Cardiovascular;  Laterality: N/A;   POLYPECTOMY  04/06/2021   Procedure: POLYPECTOMY;  Surgeon: Thornton Park, MD;  Location: [facility];  Service: Gastroenterology;;   POLYPECTOMY  05/29/2021   Procedure: POLYPECTOMY;  Surgeon: Irving Copas., MD;  Location: [facility];  Service: Gastroenterology;;   SUBMUCOSAL INJECTION  05/29/2021   Procedure: SUBMUCOSAL INJECTION;  Surgeon: Irving Copas., MD;  Location: [facility];  Service: Gastroenterology;;  EPI   SUBMUCOSAL LIFTING INJECTION  05/29/2021   Procedure: SUBMUCOSAL LIFTING INJECTION;  Surgeon: Irving Copas., MD;  Location: [facility];  Service: Gastroenterology;;  [place]   SUBMUCOSAL TATTOO INJECTION  04/06/2021  Procedure: SUBMUCOSAL TATTOO INJECTION;  Surgeon: Thornton Park, MD;  Location: [facility];  Service: Gastroenterology;;    Social History   Socioeconomic History   Marital status: Single    Spouse name: Not on file   Number of children: 1   Years of education: Not on file   Highest education level:  Not on file  Occupational History   Occupation: [employer]   Occupation: disabled  Tobacco Use   Smoking status: Former    Packs/day: 0.20    Years: 10.00    Pack years: 2.00    Types: Cigarettes    Quit date: 2015    Years since quitting: 8.3   Smokeless tobacco: Never  Vaping Use   Vaping Use: Never used  Substance and Sexual Activity   Alcohol use: Yes    Comment: occasional wine   Drug use: No   Sexual activity: Yes  Other Topics Concern   Not on file  Social History Narrative   Not on file   Social Determinants of Health   Financial Resource Strain: High Risk   Difficulty of Paying Living Expenses: Very hard  Food Insecurity: No Food Insecurity   Worried About Charity fundraiser in the Last Year: Never true   [facility] in the Last Year: Never true  Transportation Needs: No Transportation Needs   Lack of Transportation (Medical): No   Lack of Transportation (Non-Medical): No  Physical Activity: Insufficiently Active   Days of Exercise per Week: 3 days   Minutes of Exercise per Session: 30 min  Stress: Stress Concern Present   Feeling of Stress : To some extent  Social Connections: Moderately Integrated   Frequency of Communication with Friends and Family: More than three times a week   Frequency of Social Gatherings with Friends and Family: Once a week   Attends Religious Services: 1 to 4 times per year   Active Member of [employer] or Organizations: Yes   Attends Archivist Meetings: 1 to 4 times per year   Marital Status: Never married  Human resources officer Violence: Not At Risk   Fear of Current or Ex-Partner: No   Emotionally Abused: No   Physically Abused: No   Sexually Abused: No   Family History  Problem Relation Age of Onset   Diabetes Mother    Ovarian cancer Mother    Bipolar disorder Father    Hypertension Father    Prostate cancer Father    Prostate cancer Paternal Grandfather    Polycystic kidney disease Neg Hx     Current  Outpatient Medications  Medication Sig Dispense Refill   acetaminophen (TYLENOL) 500 MG tablet Take 1,000 mg by mouth every 6 (six) hours as needed for mild pain.     amoxicillin-clavulanate (AUGMENTIN) 875-125 MG tablet Take 1 tablet by mouth every 12 (twelve) hours. (Patient not taking: Reported on 07/28/2021) 14 tablet 0   calcium acetate (PHOSLO) 667 MG capsule Take 667-2,001 mg by mouth See admin instructions. Take 1 capsule (667 mg) by mouth with large snacks & take 3 capsules (2001 mg) by mouth with each meal (Patient not taking: Reported on 07/28/2021)     lidocaine-prilocaine (EMLA) cream Apply 1 application topically every Monday, Wednesday, and Friday with hemodialysis. Prior to site being accessed before dialysis (Patient not taking: Reported on 07/28/2021)     Multiple Vitamin (MULTIVITAMIN WITH MINERALS) TABS tablet Take 1 tablet by mouth in the morning.     oxyCODONE (OXY IR/ROXICODONE) 5  MG immediate release tablet Take 1 tablet (5 mg total) by mouth every 6 (six) hours as needed for severe pain. 6 tablet 0   oxyCODONE-acetaminophen (PERCOCET/ROXICET) 5-325 MG tablet Take 1 tablet by mouth every 6 (six) hours as needed for severe pain. 5 tablet 0   polyethylene glycol (MIRALAX) 17 g packet Take 17 g by mouth daily. (Patient taking differently: Take 17 g by mouth daily as needed (constipation.).) 14 each 0   PRESCRIPTION MEDICATION Inhale into the lungs at bedtime. CPAP     ticagrelor (BRILINTA) 90 MG TABS tablet Take 1 tablet (90 mg total) by mouth 2 (two) times daily. 60 tablet 0   No current facility-administered medications for this visit.    Allergies  Allergen Reactions   Ivp Dye [Iodinated Contrast Media] Itching     REVIEW OF SYSTEMS:  '[X]'$  denotes positive finding, '[ ]'$  denotes negative finding Cardiac  Comments:  Chest pain or chest pressure:    Shortness of breath upon exertion:    Short of breath when lying flat:    Irregular heart rhythm:        Vascular    Pain  in calf, thigh, or hip brought on by ambulation:    Pain in feet at night that wakes you up from your sleep:     Blood clot in your veins:    Leg swelling:         Pulmonary    Oxygen at home:    Productive cough:     Wheezing:         Neurologic    Sudden weakness in arms or legs:     Sudden numbness in arms or legs:     Sudden onset of difficulty speaking or slurred speech:    Temporary loss of vision in one eye:     Problems with dizziness:         Gastrointestinal    Blood in stool:     Vomited blood:         Genitourinary    Burning when urinating:     Blood in urine:        Psychiatric    Major depression:         Hematologic    Bleeding problems:    Problems with blood clotting too easily:        Skin    Rashes or ulcers:        Constitutional    Fever or chills:      PHYSICAL EXAMINATION:  There were no vitals filed for this visit.  General:  WDWN in NAD; vital signs documented above Gait: Not observed HENT: WNL, normocephalic Pulmonary: normal non-labored breathing , without wheezing Cardiac: regular HR, Abdomen: soft, NT, no masses Skin: without rashes Vascular Exam/Pulses:  Right Left  Radial 2+ (normal) 2+ (normal)                       Extremities: without ischemic changes, without Gangrene , without cellulitis; without open wounds;  Musculoskeletal: no muscle wasting or atrophy  Neurologic: A&O X 3;  No focal weakness or paresthesias are detected Psychiatric:  The pt has Normal affect.   Non-Invasive Vascular Imaging:   +-----------------+-------------+----------+--------------+  Left Cephalic    Diameter (cm)Depth (cm)   Findings     +-----------------+-------------+----------+--------------+  Shoulder  not visualized  +-----------------+-------------+----------+--------------+  Prox upper arm                          not visualized   +-----------------+-------------+----------+--------------+  Mid upper arm        0.19        0.63                   +-----------------+-------------+----------+--------------+  Dist upper arm       0.19        0.54                   +-----------------+-------------+----------+--------------+  Antecubital fossa    0.11        0.45                   +-----------------+-------------+----------+--------------+  Prox forearm         0.37        0.48                   +-----------------+-------------+----------+--------------+  Mid forearm          0.35        0.31                   +-----------------+-------------+----------+--------------+  Dist forearm         0.36        0.25                   +-----------------+-------------+----------+--------------+  Wrist                0.28        0.22                   +-----------------+-------------+----------+--------------+      Previous left basilic vein transposition and left brachial artery to  brachial vein AVG are occluded.     ASSESSMENT/PLAN: Olly Shiner is a 46 y.o. male presenting in need of long-term HD access.  He is had previous left-sided brachiobasilic, left-sided brachial-Ax AVG, both thrombosed. Bode is not interested in right arm access, and asked that everything be done for left arm access.  He and I discussed the need for bilateral upper extremity venogram in an effort to determine if outflow stenosis is the reason for his most recent graft thrombosis.  Venogram would also allow me to define a vein location for possible redo brachial to axillary vein bypass graft.  Dialysis days are Monday Wednesday Friday, I will schedule him with one of my partners for bilateral upper extremity venogram with plans to return to clinic in 2 weeks for surgical discussion pending results.  I asked that he call my office should any questions or concerns arise.   Broadus John, MD [facility] [phone]

## 2021-08-04 ENCOUNTER — Other Ambulatory Visit: Payer: Self-pay

## 2021-08-04 ENCOUNTER — Encounter: Payer: Self-pay | Admitting: Vascular Surgery

## 2021-08-04 ENCOUNTER — Ambulatory Visit (INDEPENDENT_AMBULATORY_CARE_PROVIDER_SITE_OTHER): Payer: Medicare Other | Admitting: Vascular Surgery

## 2021-08-04 VITALS — BP 156/93 | HR 76 | Temp 98.0°F | Resp 20 | Ht 73.0 in | Wt 256.0 lb

## 2021-08-04 DIAGNOSIS — D689 Coagulation defect, unspecified: Secondary | ICD-10-CM | POA: Diagnosis not present

## 2021-08-04 DIAGNOSIS — N186 End stage renal disease: Secondary | ICD-10-CM | POA: Diagnosis not present

## 2021-08-04 DIAGNOSIS — R52 Pain, unspecified: Secondary | ICD-10-CM | POA: Diagnosis not present

## 2021-08-04 DIAGNOSIS — R209 Unspecified disturbances of skin sensation: Secondary | ICD-10-CM | POA: Diagnosis not present

## 2021-08-04 DIAGNOSIS — L299 Pruritus, unspecified: Secondary | ICD-10-CM | POA: Diagnosis not present

## 2021-08-04 DIAGNOSIS — T7840XA Allergy, unspecified, initial encounter: Secondary | ICD-10-CM | POA: Diagnosis not present

## 2021-08-04 DIAGNOSIS — T82590A Other mechanical complication of surgically created arteriovenous fistula, initial encounter: Secondary | ICD-10-CM

## 2021-08-04 DIAGNOSIS — D631 Anemia in chronic kidney disease: Secondary | ICD-10-CM | POA: Diagnosis not present

## 2021-08-04 DIAGNOSIS — Z992 Dependence on renal dialysis: Secondary | ICD-10-CM | POA: Diagnosis not present

## 2021-08-04 DIAGNOSIS — N2581 Secondary hyperparathyroidism of renal origin: Secondary | ICD-10-CM | POA: Diagnosis not present

## 2021-08-07 DIAGNOSIS — D631 Anemia in chronic kidney disease: Secondary | ICD-10-CM | POA: Diagnosis not present

## 2021-08-07 DIAGNOSIS — T7840XA Allergy, unspecified, initial encounter: Secondary | ICD-10-CM | POA: Diagnosis not present

## 2021-08-07 DIAGNOSIS — L299 Pruritus, unspecified: Secondary | ICD-10-CM | POA: Diagnosis not present

## 2021-08-07 DIAGNOSIS — R209 Unspecified disturbances of skin sensation: Secondary | ICD-10-CM | POA: Diagnosis not present

## 2021-08-07 DIAGNOSIS — R52 Pain, unspecified: Secondary | ICD-10-CM | POA: Diagnosis not present

## 2021-08-07 DIAGNOSIS — Z992 Dependence on renal dialysis: Secondary | ICD-10-CM | POA: Diagnosis not present

## 2021-08-07 DIAGNOSIS — N186 End stage renal disease: Secondary | ICD-10-CM | POA: Diagnosis not present

## 2021-08-07 DIAGNOSIS — N2581 Secondary hyperparathyroidism of renal origin: Secondary | ICD-10-CM | POA: Diagnosis not present

## 2021-08-07 DIAGNOSIS — G4733 Obstructive sleep apnea (adult) (pediatric): Secondary | ICD-10-CM | POA: Diagnosis not present

## 2021-08-07 DIAGNOSIS — D689 Coagulation defect, unspecified: Secondary | ICD-10-CM | POA: Diagnosis not present

## 2021-08-09 DIAGNOSIS — T7840XA Allergy, unspecified, initial encounter: Secondary | ICD-10-CM | POA: Diagnosis not present

## 2021-08-09 DIAGNOSIS — D689 Coagulation defect, unspecified: Secondary | ICD-10-CM | POA: Diagnosis not present

## 2021-08-09 DIAGNOSIS — L299 Pruritus, unspecified: Secondary | ICD-10-CM | POA: Diagnosis not present

## 2021-08-09 DIAGNOSIS — N2581 Secondary hyperparathyroidism of renal origin: Secondary | ICD-10-CM | POA: Diagnosis not present

## 2021-08-09 DIAGNOSIS — D631 Anemia in chronic kidney disease: Secondary | ICD-10-CM | POA: Diagnosis not present

## 2021-08-09 DIAGNOSIS — N186 End stage renal disease: Secondary | ICD-10-CM | POA: Diagnosis not present

## 2021-08-09 DIAGNOSIS — Z992 Dependence on renal dialysis: Secondary | ICD-10-CM | POA: Diagnosis not present

## 2021-08-09 DIAGNOSIS — R209 Unspecified disturbances of skin sensation: Secondary | ICD-10-CM | POA: Diagnosis not present

## 2021-08-09 DIAGNOSIS — R52 Pain, unspecified: Secondary | ICD-10-CM | POA: Diagnosis not present

## 2021-08-11 DIAGNOSIS — N186 End stage renal disease: Secondary | ICD-10-CM | POA: Diagnosis not present

## 2021-08-11 DIAGNOSIS — N2581 Secondary hyperparathyroidism of renal origin: Secondary | ICD-10-CM | POA: Diagnosis not present

## 2021-08-11 DIAGNOSIS — Z992 Dependence on renal dialysis: Secondary | ICD-10-CM | POA: Diagnosis not present

## 2021-08-11 DIAGNOSIS — L299 Pruritus, unspecified: Secondary | ICD-10-CM | POA: Diagnosis not present

## 2021-08-11 DIAGNOSIS — R52 Pain, unspecified: Secondary | ICD-10-CM | POA: Diagnosis not present

## 2021-08-11 DIAGNOSIS — D689 Coagulation defect, unspecified: Secondary | ICD-10-CM | POA: Diagnosis not present

## 2021-08-11 DIAGNOSIS — R209 Unspecified disturbances of skin sensation: Secondary | ICD-10-CM | POA: Diagnosis not present

## 2021-08-11 DIAGNOSIS — D631 Anemia in chronic kidney disease: Secondary | ICD-10-CM | POA: Diagnosis not present

## 2021-08-11 DIAGNOSIS — T7840XA Allergy, unspecified, initial encounter: Secondary | ICD-10-CM | POA: Diagnosis not present

## 2021-08-15 DIAGNOSIS — R209 Unspecified disturbances of skin sensation: Secondary | ICD-10-CM | POA: Diagnosis not present

## 2021-08-15 DIAGNOSIS — R52 Pain, unspecified: Secondary | ICD-10-CM | POA: Diagnosis not present

## 2021-08-15 DIAGNOSIS — D631 Anemia in chronic kidney disease: Secondary | ICD-10-CM | POA: Diagnosis not present

## 2021-08-15 DIAGNOSIS — L299 Pruritus, unspecified: Secondary | ICD-10-CM | POA: Diagnosis not present

## 2021-08-15 DIAGNOSIS — D689 Coagulation defect, unspecified: Secondary | ICD-10-CM | POA: Diagnosis not present

## 2021-08-15 DIAGNOSIS — N186 End stage renal disease: Secondary | ICD-10-CM | POA: Diagnosis not present

## 2021-08-15 DIAGNOSIS — Z992 Dependence on renal dialysis: Secondary | ICD-10-CM | POA: Diagnosis not present

## 2021-08-15 DIAGNOSIS — T7840XA Allergy, unspecified, initial encounter: Secondary | ICD-10-CM | POA: Diagnosis not present

## 2021-08-15 DIAGNOSIS — N2581 Secondary hyperparathyroidism of renal origin: Secondary | ICD-10-CM | POA: Diagnosis not present

## 2021-08-16 DIAGNOSIS — N2581 Secondary hyperparathyroidism of renal origin: Secondary | ICD-10-CM | POA: Diagnosis not present

## 2021-08-16 DIAGNOSIS — Z992 Dependence on renal dialysis: Secondary | ICD-10-CM | POA: Diagnosis not present

## 2021-08-16 DIAGNOSIS — L299 Pruritus, unspecified: Secondary | ICD-10-CM | POA: Diagnosis not present

## 2021-08-16 DIAGNOSIS — D631 Anemia in chronic kidney disease: Secondary | ICD-10-CM | POA: Diagnosis not present

## 2021-08-16 DIAGNOSIS — D689 Coagulation defect, unspecified: Secondary | ICD-10-CM | POA: Diagnosis not present

## 2021-08-16 DIAGNOSIS — N186 End stage renal disease: Secondary | ICD-10-CM | POA: Diagnosis not present

## 2021-08-16 DIAGNOSIS — R209 Unspecified disturbances of skin sensation: Secondary | ICD-10-CM | POA: Diagnosis not present

## 2021-08-16 DIAGNOSIS — R52 Pain, unspecified: Secondary | ICD-10-CM | POA: Diagnosis not present

## 2021-08-16 DIAGNOSIS — T7840XA Allergy, unspecified, initial encounter: Secondary | ICD-10-CM | POA: Diagnosis not present

## 2021-08-17 DIAGNOSIS — H40003 Preglaucoma, unspecified, bilateral: Secondary | ICD-10-CM | POA: Diagnosis not present

## 2021-08-18 DIAGNOSIS — D689 Coagulation defect, unspecified: Secondary | ICD-10-CM | POA: Diagnosis not present

## 2021-08-18 DIAGNOSIS — Z992 Dependence on renal dialysis: Secondary | ICD-10-CM | POA: Diagnosis not present

## 2021-08-18 DIAGNOSIS — N2581 Secondary hyperparathyroidism of renal origin: Secondary | ICD-10-CM | POA: Diagnosis not present

## 2021-08-18 DIAGNOSIS — D631 Anemia in chronic kidney disease: Secondary | ICD-10-CM | POA: Diagnosis not present

## 2021-08-18 DIAGNOSIS — R209 Unspecified disturbances of skin sensation: Secondary | ICD-10-CM | POA: Diagnosis not present

## 2021-08-18 DIAGNOSIS — R52 Pain, unspecified: Secondary | ICD-10-CM | POA: Diagnosis not present

## 2021-08-18 DIAGNOSIS — T782XXA Anaphylactic shock, unspecified, initial encounter: Secondary | ICD-10-CM | POA: Diagnosis not present

## 2021-08-18 DIAGNOSIS — N186 End stage renal disease: Secondary | ICD-10-CM | POA: Diagnosis not present

## 2021-08-18 DIAGNOSIS — T7840XA Allergy, unspecified, initial encounter: Secondary | ICD-10-CM | POA: Diagnosis not present

## 2021-08-18 DIAGNOSIS — D509 Iron deficiency anemia, unspecified: Secondary | ICD-10-CM | POA: Diagnosis not present

## 2021-08-21 DIAGNOSIS — R52 Pain, unspecified: Secondary | ICD-10-CM | POA: Diagnosis not present

## 2021-08-21 DIAGNOSIS — R209 Unspecified disturbances of skin sensation: Secondary | ICD-10-CM | POA: Diagnosis not present

## 2021-08-21 DIAGNOSIS — T782XXA Anaphylactic shock, unspecified, initial encounter: Secondary | ICD-10-CM | POA: Diagnosis not present

## 2021-08-21 DIAGNOSIS — N2581 Secondary hyperparathyroidism of renal origin: Secondary | ICD-10-CM | POA: Diagnosis not present

## 2021-08-21 DIAGNOSIS — T7840XA Allergy, unspecified, initial encounter: Secondary | ICD-10-CM | POA: Diagnosis not present

## 2021-08-21 DIAGNOSIS — D689 Coagulation defect, unspecified: Secondary | ICD-10-CM | POA: Diagnosis not present

## 2021-08-21 DIAGNOSIS — D509 Iron deficiency anemia, unspecified: Secondary | ICD-10-CM | POA: Diagnosis not present

## 2021-08-21 DIAGNOSIS — N186 End stage renal disease: Secondary | ICD-10-CM | POA: Diagnosis not present

## 2021-08-21 DIAGNOSIS — D631 Anemia in chronic kidney disease: Secondary | ICD-10-CM | POA: Diagnosis not present

## 2021-08-21 DIAGNOSIS — Z992 Dependence on renal dialysis: Secondary | ICD-10-CM | POA: Diagnosis not present

## 2021-08-23 DIAGNOSIS — D689 Coagulation defect, unspecified: Secondary | ICD-10-CM | POA: Diagnosis not present

## 2021-08-23 DIAGNOSIS — Z992 Dependence on renal dialysis: Secondary | ICD-10-CM | POA: Diagnosis not present

## 2021-08-23 DIAGNOSIS — D509 Iron deficiency anemia, unspecified: Secondary | ICD-10-CM | POA: Diagnosis not present

## 2021-08-23 DIAGNOSIS — T7840XA Allergy, unspecified, initial encounter: Secondary | ICD-10-CM | POA: Diagnosis not present

## 2021-08-23 DIAGNOSIS — R52 Pain, unspecified: Secondary | ICD-10-CM | POA: Diagnosis not present

## 2021-08-23 DIAGNOSIS — R209 Unspecified disturbances of skin sensation: Secondary | ICD-10-CM | POA: Diagnosis not present

## 2021-08-23 DIAGNOSIS — D631 Anemia in chronic kidney disease: Secondary | ICD-10-CM | POA: Diagnosis not present

## 2021-08-23 DIAGNOSIS — T782XXA Anaphylactic shock, unspecified, initial encounter: Secondary | ICD-10-CM | POA: Diagnosis not present

## 2021-08-23 DIAGNOSIS — N186 End stage renal disease: Secondary | ICD-10-CM | POA: Diagnosis not present

## 2021-08-23 DIAGNOSIS — N2581 Secondary hyperparathyroidism of renal origin: Secondary | ICD-10-CM | POA: Diagnosis not present

## 2021-08-24 ENCOUNTER — Ambulatory Visit (HOSPITAL_COMMUNITY)
Admission: RE | Admit: 2021-08-24 | Discharge: 2021-08-24 | Disposition: A | Discharge status: Home / Self Care | Payer: Medicare Other | Attending: Vascular Surgery | Admitting: Vascular Surgery

## 2021-08-24 ENCOUNTER — Other Ambulatory Visit: Payer: Self-pay

## 2021-08-24 ENCOUNTER — Encounter (HOSPITAL_COMMUNITY)
Admission: RE | Disposition: A | Discharge status: Home / Self Care | Payer: Self-pay | Source: Home / Self Care | Attending: Vascular Surgery

## 2021-08-24 ENCOUNTER — Encounter (HOSPITAL_COMMUNITY): Payer: Self-pay | Admitting: Vascular Surgery

## 2021-08-24 DIAGNOSIS — Z87891 Personal history of nicotine dependence: Secondary | ICD-10-CM | POA: Diagnosis not present

## 2021-08-24 DIAGNOSIS — Y841 Kidney dialysis as the cause of abnormal reaction of the patient, or of later complication, without mention of misadventure at the time of the procedure: Secondary | ICD-10-CM | POA: Diagnosis not present

## 2021-08-24 DIAGNOSIS — Z992 Dependence on renal dialysis: Secondary | ICD-10-CM | POA: Diagnosis not present

## 2021-08-24 DIAGNOSIS — N186 End stage renal disease: Secondary | ICD-10-CM | POA: Insufficient documentation

## 2021-08-24 DIAGNOSIS — T82898A Other specified complication of vascular prosthetic devices, implants and grafts, initial encounter: Secondary | ICD-10-CM | POA: Insufficient documentation

## 2021-08-24 DIAGNOSIS — I120 Hypertensive chronic kidney disease with stage 5 chronic kidney disease or end stage renal disease: Secondary | ICD-10-CM | POA: Diagnosis not present

## 2021-08-24 DIAGNOSIS — T82590A Other mechanical complication of surgically created arteriovenous fistula, initial encounter: Secondary | ICD-10-CM

## 2021-08-24 DIAGNOSIS — N185 Chronic kidney disease, stage 5: Secondary | ICD-10-CM | POA: Diagnosis not present

## 2021-08-24 HISTORY — PX: UPPER EXTREMITY VENOGRAPHY: CATH118272

## 2021-08-24 LAB — POCT I-STAT, CHEM 8
BUN: 44 mg/dL — ABNORMAL HIGH (ref 6–20)
Calcium, Ion: 1.05 mmol/L — ABNORMAL LOW (ref 1.15–1.40)
Chloride: 99 mmol/L (ref 98–111)
Creatinine, Ser: 10.3 mg/dL — ABNORMAL HIGH (ref 0.61–1.24)
Glucose, Bld: 86 mg/dL (ref 70–99)
HCT: 37 % — ABNORMAL LOW (ref 39.0–52.0)
Hemoglobin: 12.6 g/dL — ABNORMAL LOW (ref 13.0–17.0)
Potassium: 5.5 mmol/L — ABNORMAL HIGH (ref 3.5–5.1)
Sodium: 137 mmol/L (ref 135–145)
TCO2: 31 mmol/L (ref 22–32)

## 2021-08-24 SURGERY — UPPER EXTREMITY VENOGRAPHY
Anesthesia: LOCAL | Laterality: Bilateral

## 2021-08-24 MED ORDER — SODIUM CHLORIDE 0.9% FLUSH: 3.0000 mL | Freq: Two times a day (BID) | INTRAVENOUS | Status: DC

## 2021-08-24 MED ORDER — DIPHENHYDRAMINE HCL 50 MG/ML IJ SOLN
25.0000 mg | INTRAMUSCULAR | Status: AC
  Administered 2021-08-24: 25 mg via INTRAVENOUS
  Filled 2021-08-24: qty 1

## 2021-08-24 MED ORDER — METHYLPREDNISOLONE SODIUM SUCC 125 MG IJ SOLR
125.0000 mg | INTRAMUSCULAR | Status: AC
  Administered 2021-08-24: 125 mg via INTRAVENOUS
  Filled 2021-08-24: qty 2

## 2021-08-24 MED ORDER — SODIUM CHLORIDE 0.9% FLUSH: 3.0000 mL | INTRAVENOUS | Status: DC | PRN

## 2021-08-24 MED ORDER — IODIXANOL 320 MG/ML IV SOLN
INTRAVENOUS | Status: DC | PRN
  Administered 2021-08-24: 60 mL

## 2021-08-24 MED ORDER — SODIUM CHLORIDE 0.9 % IV SOLN: 250.0000 mL | INTRAVENOUS | Status: DC | PRN

## 2021-08-24 SURGICAL SUPPLY — 2 items
STOPCOCK MORSE 400PSI 3WAY (MISCELLANEOUS) ×2 IMPLANT
TUBING CIL FLEX 10 FLL-RA (TUBING) ×1 IMPLANT

## 2021-08-24 NOTE — Op Note (Signed)
Date: August 24, 2021  Preoperative diagnosis: Multiple failed left upper extremity access procedures in the setting of ESRD  Postoperative diagnosis:  Procedure: Bilateral upper extremity venogram  Surgeon: Dr. Marty Heck, MD  Assistant: Cath Lab staff  Indications: 46 year old male that has had failed left access procedures including a brachiobasilic fistula and left upper arm AV graft.  He was seen by my partner Dr. Virl Cagey who recommended bilateral upper extremity venogram.  He presents today after risks benefits discussed.  Findings: There was a tunneled dialysis catheter in the right IJ through the central venous system.  The proximal right subclavian vein appeared to be diseased with approximate 50% stenosis.  The right upper arm brachial and axillary veins and mid to distal subclavian veins are widely patent.  On the left he has widely patent central venous system with a patent axillary, subclavian, innominate vein.  Patent brachial veins throughout the upper arm on the left.  The left basilic vein is noted to be occluded.  The left cephalic vein is occluded in the distal upper arm and is atretic in the proximal upper arm.  Anesthesia: None  Details: The patient was taken to the Cath Lab after risks benefits discussed.  Placed on the procedure table after bilateral upper extremity IVs were obtained in both forearms in pre-op.  Timeout was performed.  Ultimately contrast was used initially to inject the IV in the right forearm under fluoroscopic guidance and then the left IV in the forearm under fluoroscopic guidance.  Findings are noted above.  He remained stable.  Taken to recovery in stable condition.  Complications: None  Condition: Stable  Plan: We will schedule for left upper arm redo AV graft.  Patient prefers to keep access in his left arm.  Central venous system widely patent on the left.  Marty Heck, MD [facility] Office:  [place]

## 2021-08-24 NOTE — H&P (Signed)
History and Physical Interval Note:  08/24/2021 9:05 AM  Michael [name]  has presented today for surgery, with the diagnosis of end stage renal disease, avg malfunction.  The various methods of treatment have been discussed with the patient and family. After consideration of risks, benefits and other options for treatment, the patient has consented to  Procedure(s): UPPER EXTREMITY VENOGRAPHY (N/A) as a surgical intervention.  The patient's history has been reviewed, patient examined, no change in status, stable for surgery.  I have reviewed the patient's chart and labs.  Questions were answered to the patient's satisfaction.     Michael [name]  Office Note        CC:  AVG malfunction - AVG clotted again after several thrombectomies.  Requesting Provider:  [name], FNP   HPI: Michael [name] is a 46 y.o. (1975-04-29) male presenting at the request of Dr. Johnney [name], for AV graft malfunction. Patient has had multiple upper extremity permanent HD access attempts including 11/2019 left brachiobasilic fistula creation complicated by thrombosis, 03/2020 left upper extremity AV graft now thrombosed after multiple declots.  He is currently being dialyzed via a right-sided tunneled HD line.  On exam, Michael [name] was doing well.  He had no complaints.  He does not want access in his right arm as he is right arm dominant.  He would like to pursue any and all options available in the left arm.  His previous surgeries have left him with some paresthesias in the left forearm.    Currently on the transplant list for the last year and a half.       Past Medical History:  Diagnosis Date   Gout     History of renal dialysis      M-W-F   HLD (hyperlipidemia)     Hypertension     Pneumonia     Polycystic kidney disease     Sleep apnea 07/29/2018    uses cpap nightly   Vitamin D deficiency 11/2018           Past Surgical History:  Procedure Laterality Date   AV FISTULA PLACEMENT Left 03/24/2020     Procedure: INSERTION OF LEFT UPPER EXTREMITY ARTERIOVENOUS (AV) GORE-TEX GRAFT;  Surgeon: [name], MD;  Location: [place];  Service: Vascular;  Laterality: Left;  PERIPHERAL NERVE BLOCK   [place] Left 12/11/2019    Procedure: 1ST STAGE BASILIC TRANSPOSITION OF LEFT ARM;  Surgeon: Angelia Mould, MD;  Location: [place];  Service: Vascular;  Laterality: Left;   BIOPSY   04/06/2021    Procedure: BIOPSY;  Surgeon: Thornton Park, MD;  Location: [facility];  Service: Gastroenterology;;   COLONOSCOPY WITH PROPOFOL N/A 04/06/2021    Procedure: COLONOSCOPY WITH PROPOFOL;  Surgeon: Thornton Park, MD;  Location: [facility];  Service: Gastroenterology;  Laterality: N/A;   COLONOSCOPY WITH PROPOFOL N/A 05/29/2021    Procedure: COLONOSCOPY WITH PROPOFOL;  Surgeon: Rush Landmark Telford Nab., MD;  Location: [facility];  Service: Gastroenterology;  Laterality: N/A;   DG AV DIALYSIS GRAFT DECLOT OR Left 05/17/2020   ENDOSCOPIC MUCOSAL RESECTION   05/29/2021    Procedure: ENDOSCOPIC MUCOSAL RESECTION;  Surgeon: Rush Landmark Telford Nab., MD;  Location: [facility];  Service: Gastroenterology;;   HEMOSTASIS CLIP PLACEMENT   05/29/2021    Procedure: HEMOSTASIS CLIP PLACEMENT;  Surgeon: Irving Copas., MD;  Location: [facility];  Service: Gastroenterology;;   IR FLUORO GUIDE CV LINE RIGHT   12/09/2019   IR US GUIDE VASC ACCESS RIGHT  12/09/2019   LEFT HEART CATH AND CORONARY ANGIOGRAPHY N/A 12/09/2019    Procedure: LEFT HEART CATH AND CORONARY ANGIOGRAPHY;  Surgeon: Adrian Prows, MD;  Location: [facility];  Service: Cardiovascular;  Laterality: N/A;   POLYPECTOMY   04/06/2021    Procedure: POLYPECTOMY;  Surgeon: Thornton Park, MD;  Location: [facility];  Service: Gastroenterology;;   POLYPECTOMY   05/29/2021    Procedure: POLYPECTOMY;  Surgeon: Irving Copas., MD;  Location: [facility];  Service: Gastroenterology;;   SUBMUCOSAL INJECTION    05/29/2021    Procedure: SUBMUCOSAL INJECTION;  Surgeon: Irving Copas., MD;  Location: [facility];  Service: Gastroenterology;;  EPI   SUBMUCOSAL LIFTING INJECTION   05/29/2021    Procedure: SUBMUCOSAL LIFTING INJECTION;  Surgeon: Irving Copas., MD;  Location: [facility];  Service: Gastroenterology;;  [place]   SUBMUCOSAL TATTOO INJECTION   04/06/2021    Procedure: SUBMUCOSAL TATTOO INJECTION;  Surgeon: Thornton Park, MD;  Location: [facility];  Service: Gastroenterology;;      Social History         Socioeconomic History   Marital status: Single      Spouse name: Not on file   Number of children: 1   Years of education: Not on file   Highest education level: Not on file  Occupational History   Occupation: [employer]   Occupation: disabled  Tobacco Use   Smoking status: Former      Packs/day: 0.20      Years: 10.00      Pack years: 2.00      Types: Cigarettes      Quit date: 2015      Years since quitting: 8.3   Smokeless tobacco: Never  Vaping Use   Vaping Use: Never used  Substance and Sexual Activity   Alcohol use: Yes      Comment: occasional wine   Drug use: No   Sexual activity: Yes  Other Topics Concern   Not on file  Social History Narrative   Not on file    Social Determinants of Health       Financial Resource Strain: High Risk   Difficulty of Paying Living Expenses: Very hard  Food Insecurity: No Food Insecurity   Worried About Charity fundraiser in the Last Year: Never true   Ran Out of Food in the Last Year: Never true  Transportation Needs: No Transportation Needs   Lack of Transportation (Medical): No   Lack of Transportation (Non-Medical): No  Physical Activity: Insufficiently Active   Days of Exercise per Week: 3 days   Minutes of Exercise per Session: 30 min  Stress: Stress Concern Present   Feeling of Stress : To some extent  Social Connections: Moderately Integrated   Frequency of Communication with Friends and  Family: More than three times a week   Frequency of Social Gatherings with Friends and Family: Once a week   Attends Religious Services: 1 to 4 times per year   Active Member of [employer] or Organizations: Yes   Attends Archivist Meetings: 1 to 4 times per year   Marital Status: Never married  Human resources officer Violence: Not At Risk   Fear of Current or Ex-Partner: No   Emotionally Abused: No   Physically Abused: No   Sexually Abused: No         Family History  Problem Relation Age of Onset   Diabetes Mother     Ovarian cancer Mother  Bipolar disorder Father     Hypertension Father     Prostate cancer Father     Prostate cancer Paternal Grandfather     Polycystic kidney disease Neg Hx              Current Outpatient Medications  Medication Sig Dispense Refill   acetaminophen (TYLENOL) 500 MG tablet Take 1,000 mg by mouth every 6 (six) hours as needed for mild pain.       amoxicillin-clavulanate (AUGMENTIN) 875-125 MG tablet Take 1 tablet by mouth every 12 (twelve) hours. (Patient not taking: Reported on 07/28/2021) 14 tablet 0   calcium acetate (PHOSLO) 667 MG capsule Take 667-2,001 mg by mouth See admin instructions. Take 1 capsule (667 mg) by mouth with large snacks & take 3 capsules (2001 mg) by mouth with each meal (Patient not taking: Reported on 07/28/2021)       lidocaine-prilocaine (EMLA) cream Apply 1 application topically every Monday, Wednesday, and Friday with hemodialysis. Prior to site being accessed before dialysis (Patient not taking: Reported on 07/28/2021)       Multiple Vitamin (MULTIVITAMIN WITH MINERALS) TABS tablet Take 1 tablet by mouth in the morning.       oxyCODONE (OXY IR/ROXICODONE) 5 MG immediate release tablet Take 1 tablet (5 mg total) by mouth every 6 (six) hours as needed for severe pain. 6 tablet 0   oxyCODONE-acetaminophen (PERCOCET/ROXICET) 5-325 MG tablet Take 1 tablet by mouth every 6 (six) hours as needed for severe pain. 5 tablet 0    polyethylene glycol (MIRALAX) 17 g packet Take 17 g by mouth daily. (Patient taking differently: Take 17 g by mouth daily as needed (constipation.).) 14 each 0   PRESCRIPTION MEDICATION Inhale into the lungs at bedtime. CPAP       ticagrelor (BRILINTA) 90 MG TABS tablet Take 1 tablet (90 mg total) by mouth 2 (two) times daily. 60 tablet 0    No current facility-administered medications for this visit.          Allergies  Allergen Reactions   Ivp Dye [Iodinated Contrast Media] Itching        REVIEW OF SYSTEMS:  '[X]'$  denotes positive finding, '[ ]'$  denotes negative finding Cardiac   Comments:  Chest pain or chest pressure:      Shortness of breath upon exertion:      Short of breath when lying flat:      Irregular heart rhythm:             Vascular      Pain in calf, thigh, or hip brought on by ambulation:      Pain in feet at night that wakes you up from your sleep:       Blood clot in your veins:      Leg swelling:              Pulmonary      Oxygen at home:      Productive cough:       Wheezing:              Neurologic      Sudden weakness in arms or legs:       Sudden numbness in arms or legs:       Sudden onset of difficulty speaking or slurred speech:      Temporary loss of vision in one eye:       Problems with dizziness:  Gastrointestinal      Blood in stool:       Vomited blood:              Genitourinary      Burning when urinating:       Blood in urine:             Psychiatric      Major depression:              Hematologic      Bleeding problems:      Problems with blood clotting too easily:             Skin      Rashes or ulcers:             Constitutional      Fever or chills:          PHYSICAL EXAMINATION:   There were no vitals filed for this visit.   General:  WDWN in NAD; vital signs documented above Gait: Not observed HENT: WNL, normocephalic Pulmonary: normal non-labored breathing , without wheezing Cardiac: regular  HR, Abdomen: soft, NT, no masses Skin: without rashes Vascular Exam/Pulses:   Right Left  Radial 2+ (normal) 2+ (normal)                                       Extremities: without ischemic changes, without Gangrene , without cellulitis; without open wounds;  Musculoskeletal: no muscle wasting or atrophy       Neurologic: A&O X 3;  No focal weakness or paresthesias are detected Psychiatric:  The pt has Normal affect.     Non-Invasive Vascular Imaging:   +-----------------+-------------+----------+--------------+  Left Cephalic    Diameter (cm)Depth (cm)   Findings     +-----------------+-------------+----------+--------------+  Shoulder                                not visualized  +-----------------+-------------+----------+--------------+  Prox upper arm                          not visualized  +-----------------+-------------+----------+--------------+  Mid upper arm        0.19        0.63                   +-----------------+-------------+----------+--------------+  Dist upper arm       0.19        0.54                   +-----------------+-------------+----------+--------------+  Antecubital fossa    0.11        0.45                   +-----------------+-------------+----------+--------------+  Prox forearm         0.37        0.48                   +-----------------+-------------+----------+--------------+  Mid forearm          0.35        0.31                   +-----------------+-------------+----------+--------------+  Dist forearm         0.36        0.25                   +-----------------+-------------+----------+--------------+  Wrist                0.28        0.22                   +-----------------+-------------+----------+--------------+      Previous left basilic vein transposition and left brachial artery to  brachial vein AVG are occluded.        ASSESSMENT/PLAN: Athel Merriweather is a 46 y.o. male  presenting in need of long-term HD access.  He is had previous left-sided brachiobasilic, left-sided brachial-Ax AVG, both thrombosed. Orlen is not interested in right arm access, and asked that everything be done for left arm access.   He and I discussed the need for bilateral upper extremity venogram in an effort to determine if outflow stenosis is the reason for his most recent graft thrombosis.  Venogram would also allow me to define a vein location for possible redo brachial to axillary vein bypass graft.   Dialysis days are Monday Wednesday Friday, I will schedule him with one of my partners for bilateral upper extremity venogram with plans to return to clinic in 2 weeks for surgical discussion pending results.   I asked that he call my office should any questions or concerns arise.     Michael John, MD [facility] [phone]

## 2021-08-25 DIAGNOSIS — N186 End stage renal disease: Secondary | ICD-10-CM | POA: Diagnosis not present

## 2021-08-25 DIAGNOSIS — D509 Iron deficiency anemia, unspecified: Secondary | ICD-10-CM | POA: Diagnosis not present

## 2021-08-25 DIAGNOSIS — R209 Unspecified disturbances of skin sensation: Secondary | ICD-10-CM | POA: Diagnosis not present

## 2021-08-25 DIAGNOSIS — D631 Anemia in chronic kidney disease: Secondary | ICD-10-CM | POA: Diagnosis not present

## 2021-08-25 DIAGNOSIS — D689 Coagulation defect, unspecified: Secondary | ICD-10-CM | POA: Diagnosis not present

## 2021-08-25 DIAGNOSIS — R52 Pain, unspecified: Secondary | ICD-10-CM | POA: Diagnosis not present

## 2021-08-25 DIAGNOSIS — N2581 Secondary hyperparathyroidism of renal origin: Secondary | ICD-10-CM | POA: Diagnosis not present

## 2021-08-25 DIAGNOSIS — Z992 Dependence on renal dialysis: Secondary | ICD-10-CM | POA: Diagnosis not present

## 2021-08-25 DIAGNOSIS — T7840XA Allergy, unspecified, initial encounter: Secondary | ICD-10-CM | POA: Diagnosis not present

## 2021-08-25 DIAGNOSIS — T782XXA Anaphylactic shock, unspecified, initial encounter: Secondary | ICD-10-CM | POA: Diagnosis not present

## 2021-08-28 DIAGNOSIS — D689 Coagulation defect, unspecified: Secondary | ICD-10-CM | POA: Diagnosis not present

## 2021-08-28 DIAGNOSIS — D509 Iron deficiency anemia, unspecified: Secondary | ICD-10-CM | POA: Diagnosis not present

## 2021-08-28 DIAGNOSIS — N186 End stage renal disease: Secondary | ICD-10-CM | POA: Diagnosis not present

## 2021-08-28 DIAGNOSIS — D631 Anemia in chronic kidney disease: Secondary | ICD-10-CM | POA: Diagnosis not present

## 2021-08-28 DIAGNOSIS — Z992 Dependence on renal dialysis: Secondary | ICD-10-CM | POA: Diagnosis not present

## 2021-08-28 DIAGNOSIS — T782XXA Anaphylactic shock, unspecified, initial encounter: Secondary | ICD-10-CM | POA: Diagnosis not present

## 2021-08-28 DIAGNOSIS — R52 Pain, unspecified: Secondary | ICD-10-CM | POA: Diagnosis not present

## 2021-08-28 DIAGNOSIS — R209 Unspecified disturbances of skin sensation: Secondary | ICD-10-CM | POA: Diagnosis not present

## 2021-08-28 DIAGNOSIS — N2581 Secondary hyperparathyroidism of renal origin: Secondary | ICD-10-CM | POA: Diagnosis not present

## 2021-08-28 DIAGNOSIS — T7840XA Allergy, unspecified, initial encounter: Secondary | ICD-10-CM | POA: Diagnosis not present

## 2021-08-29 ENCOUNTER — Other Ambulatory Visit: Payer: Self-pay

## 2021-08-29 DIAGNOSIS — T82590A Other mechanical complication of surgically created arteriovenous fistula, initial encounter: Secondary | ICD-10-CM

## 2021-08-29 DIAGNOSIS — N186 End stage renal disease: Secondary | ICD-10-CM

## 2021-08-30 ENCOUNTER — Encounter (HOSPITAL_COMMUNITY): Payer: Self-pay | Admitting: Vascular Surgery

## 2021-08-30 DIAGNOSIS — T782XXA Anaphylactic shock, unspecified, initial encounter: Secondary | ICD-10-CM | POA: Diagnosis not present

## 2021-08-30 DIAGNOSIS — N2581 Secondary hyperparathyroidism of renal origin: Secondary | ICD-10-CM | POA: Diagnosis not present

## 2021-08-30 DIAGNOSIS — D509 Iron deficiency anemia, unspecified: Secondary | ICD-10-CM | POA: Diagnosis not present

## 2021-08-30 DIAGNOSIS — R52 Pain, unspecified: Secondary | ICD-10-CM | POA: Diagnosis not present

## 2021-08-30 DIAGNOSIS — N186 End stage renal disease: Secondary | ICD-10-CM | POA: Diagnosis not present

## 2021-08-30 DIAGNOSIS — T7840XA Allergy, unspecified, initial encounter: Secondary | ICD-10-CM | POA: Diagnosis not present

## 2021-08-30 DIAGNOSIS — D689 Coagulation defect, unspecified: Secondary | ICD-10-CM | POA: Diagnosis not present

## 2021-08-30 DIAGNOSIS — D631 Anemia in chronic kidney disease: Secondary | ICD-10-CM | POA: Diagnosis not present

## 2021-08-30 DIAGNOSIS — Z992 Dependence on renal dialysis: Secondary | ICD-10-CM | POA: Diagnosis not present

## 2021-08-30 DIAGNOSIS — R209 Unspecified disturbances of skin sensation: Secondary | ICD-10-CM | POA: Diagnosis not present

## 2021-08-30 NOTE — Anesthesia Preprocedure Evaluation (Addendum)
Anesthesia Evaluation  Patient identified by MRN, date of birth, ID band Patient awake    Reviewed: Allergy & Precautions, NPO status , Patient's Chart, lab work & pertinent test results  Airway Mallampati: II  TM Distance: >3 FB Neck ROM: Full    Dental  (+) Teeth Intact, Dental Advisory Given   Pulmonary sleep apnea , former smoker,    breath sounds clear to auscultation       Cardiovascular hypertension,  Rhythm:Regular Rate:Normal     Neuro/Psych negative neurological ROS     GI/Hepatic Neg liver ROS, GERD  ,  Endo/Other  negative endocrine ROS  Renal/GU ESRF and DialysisRenal disease     Musculoskeletal  (+) Arthritis ,   Abdominal Normal abdominal exam  (+)   Peds  Hematology negative hematology ROS (+)   Anesthesia Other Findings   Reproductive/Obstetrics                           Anesthesia Physical Anesthesia Plan  ASA: 3  Anesthesia Plan: Regional   Post-op Pain Management:    Induction: Intravenous  PONV Risk Score and Plan: 2 and Propofol infusion, Ondansetron and Midazolam  Airway Management Planned: Natural Airway and Simple Face Mask  Additional Equipment: None  Intra-op Plan:   Post-operative Plan:   Informed Consent: I have reviewed the patients History and Physical, chart, labs and discussed the procedure including the risks, benefits and alternatives for the proposed anesthesia with the patient or authorized representative who has indicated his/her understanding and acceptance.     Dental advisory given  Plan Discussed with: CRNA  Anesthesia Plan Comments: (PAT note written 08/30/2021 by Myra Gianotti, PA-C. )      Anesthesia Quick Evaluation

## 2021-08-30 NOTE — Progress Notes (Signed)
PCP - None Cardiologist - n/a Neurosurgery - Dr Barnetta Hammersmith Nephrology - Dr Corliss Parish  Chest x-ray - 05/29/21 (1V) EKG - 08/24/21 Stress Test - 12/02/19, 08/23/20 CE ECHO - 11/30/19 Cardiac Cath - 12/09/19  ICD Pacemaker/Loop - n/a  Sleep Study -  Yes CPAP - uses CPAP nightly  Anesthesia - Yes, Ebony Hail, PA  STOP now taking any Aspirin (unless otherwise instructed by your surgeon), Aleve, Naproxen, Ibuprofen, Motrin, Advil, Goody's, BC's, all herbal medications, fish oil, and all vitamins.   Coronavirus Screening Do you have any of the following symptoms:  Cough yes/no: No Fever (>100.44F)  yes/no: No Runny nose yes/no: No Sore throat yes/no: No Difficulty breathing/shortness of breath  yes/no: No  Have you traveled in the last 14 days and where? yes/no: No  Patient verbalized understanding of instructions that were given via phone.

## 2021-08-30 NOTE — Progress Notes (Signed)
Anesthesia Chart Review: Michael [name]  Case: [id number] Date/Time: 08/31/21 0715   Procedure: REDO LEFT UPPER ARM ARTERIOVENOUS GORETEX GRAFT (Left) - PERIPHERAL NERVE BLOCK   Anesthesia type: Monitor Anesthesia Care   Pre-op diagnosis: ESRD, Malfunction of arteriovenous graft   Location: [facility] OR ROOM 11 / [facility]   Surgeons: Broadus John, MD       DISCUSSION: Patient is a 46 year old male scheduled for the above procedure. He has a failed LUE AVGG and is using a right IJ TDC.   History includes former smoker (quit 03/19/13), HTN, polycystic kidney disease, ESRD (started hemodialysis 12/09/19), HLD, OSA (CPAP), gout, SDH (subacute right SDH 08/23/20 CTA head), intracranial aneurysms (s/p coil embolization: right middle meningeal artery 09/08/20, pericallosal artery 09/22/20, right MCA 12/22/20).  He was on Brilinta for cerebral aneurysm coil embolizations. Per 07/28/21 note by Barnetta Hammersmith, NP with [facility], "From our standpoint he needs to stop the Brilinta he no longer needs it for what we treated him for (aneurysms)." Per 08/16/21 medication list, he was not taking Brilinta.  Anesthesia team to evaluate on the day of surgery.   VS: Ht '6\' 1"'$  (1.854 m)   Wt 116.8 kg   BMI 33.97 kg/m  BP Readings from Last 3 Encounters:  08/24/21 (!) 188/89  08/04/21 (!) 156/93  07/28/21 (!) 146/114   Pulse Readings from Last 3 Encounters:  08/24/21 69  08/04/21 76  07/28/21 (!) 103     PROVIDERS: [name], FNP is PCP  - Corliss Parish, MD is nephrologist - Leighton Ruff, MD is general surgeon. Seen on 05/02/21 for sigmoid tubular adenoma with high grade dysplasia. Partial colectomy considered, pending results from attempted removal by GI Mansouraty, Valarie Merino, MD. Large polyp was resected with negative margin during 05/29/21 colonoscopy.  Thornton Park, MD is primary GI Marcelline Mates, MD is neurosurgeon - Adrian Prows, MD is cardiologist. Last evaluation was during  11/2019 hospitalization and had normal coronaries.    LABS: For day of surgery. H/H 12.6/37.0 as of 08/25/10.    OTHER: Cerebral Arteriogram 07/10/21 ([facility]): CONCLUSIONS:  1. The flow diverted anterior cerebral artery aneurysm opacifies slowly and demonstrates further regression with dome measuring 4.4 mm x 3.6 mm x 4.6 mm (was 8 mm originally, reduced to 5.6 mm in greatest dimension 6 months ago). There is no in-stent stenosis present.  2. The right middle cerebral artery stent-coiled aneurysm is completely occluded with no evidence of residual or recurrent aneurysm (Raymond grade I occlusion). There is no in-stent stenosis present.  3. The right middle cerebral artery bifurcation aneurysm appears stable in size and shape and measures 3.6 mm x 3.3 mm.  4. There is no evidence of recurrent subdural hematoma.   Colonoscopy 05/29/21: IMPRESSION: - Hemorrhoids found on digital rectal exam. - Old blood in the rectum, in the recto-sigmoid colon and in the sigmoid colon - lavaged. - One 12 mm polyp at the appendiceal orifice, removed with cold mucosal resection after lifting it out of the appendix. Resected and retrieved. - One greater than 50 mm polyp in the sigmoid colon, removed with mucosal resection. Polyloop was placed around the lesion. Snare resection then performed. Resected and retrieved. Clips (MR conditional) were placed. - Three 2 to 5 mm polyps in the sigmoid colon and in the cecum, removed with a cold snare. Resected and retrieved. - Normal mucosa in the entire examined colon otherwise. - Non-bleeding non-thrombosed internal hemorrhoids. PATHOLOGY: -Tubular adenoma appendiceal orifice without high-grade  dysplasia. - Colon, cecum, sigmoid, polypectomy tubular adenoma without high-grade dysplasia. -Colon, sigmoid, polypectomy with tubular adenoma with foci of high-grade glandular dysplasia, no invasive carcinoma, stalk margin negative for adenoma. -Repeat colonoscopy in 1 year  recommended.   IMAGES: 1V PCXR 05/29/21: FINDINGS: Transverse diameter of heart is increased. There are no signs of pulmonary edema or focal pulmonary consolidation. There is no pleural effusion or pneumothorax. There is no evidence of pneumoperitoneum. IMPRESSION: No active disease.    EKG: 08/24/21: Moderate voltage criteria for LVH, may be normal variant ( R in aVL , Sokolow-Lyon ) Borderline ECG When compared with ECG of 18-Jul-2020 19:58, PREVIOUS ECG IS PRESENT Confirmed by Carlyle Dolly [phone]) on 08/25/2021 8:36:51 PM   CV: Stress echo 08/23/20 ([facility]): SUMMARY  The patient had no chest pain.  The METS achieved was 9.5.  Exercise capacity was average.  Negative stress ECG for inducible ischemia at heart rate achieved.  Negative exercise echocardiography for inducible ischemia at target  heart rate.   Echo 08/23/20 ([facility]): SUMMARY  The left ventricular size is normal.  There is moderate concentric left ventricular hypertrophy.  LV ejection fraction = >70%.  The left ventricle is hyperdynamic.  The left ventricular wall motion is normal.  Left ventricular filling pattern is prolonged relaxation.  The right ventricle is normal size.  The right ventricular systolic function is normal.  There is no significant valvular stenosis or regurgitation.  There was insufficient TR detected to calculate RV systolic pressure.  There is no pericardial effusion.  The IVC is normal in size with an inspiratory collapse of greater then  50%, suggesting normal right atrial pressure.  There is no comparison study available.   Cardiac cath 12/09/19: Normal coronary arteries. Tortuous and suggests hypertensive heart disease. LVEF 70%    Past Medical History:  Diagnosis Date   Gout    History of renal dialysis    M-W-F   HLD (hyperlipidemia)    Hypertension    Intracranial aneurysm    s/p coil embolization: right middle meningeal artery 09/08/20, pericallosal artery  09/22/20, right MCA 12/22/20   Pneumonia    Polycystic kidney disease    SDH (subdural hematoma) ([place])    subacute right SDH 08/23/20 CTA head   Sleep apnea 07/29/2018   uses cpap nightly   Vitamin D deficiency 11/2018    Past Surgical History:  Procedure Laterality Date   AV FISTULA PLACEMENT Left 03/24/2020   Procedure: INSERTION OF LEFT UPPER EXTREMITY ARTERIOVENOUS (AV) GORE-TEX GRAFT;  Surgeon: [name], MD;  Location: [place];  Service: Vascular;  Laterality: Left;  PERIPHERAL NERVE BLOCK   [place] Left 12/11/2019   Procedure: 1ST STAGE BASILIC TRANSPOSITION OF LEFT ARM;  Surgeon: Angelia Mould, MD;  Location: [place];  Service: Vascular;  Laterality: Left;   BIOPSY  04/06/2021   Procedure: BIOPSY;  Surgeon: Thornton Park, MD;  Location: [facility];  Service: Gastroenterology;;   COLONOSCOPY WITH PROPOFOL N/A 04/06/2021   Procedure: COLONOSCOPY WITH PROPOFOL;  Surgeon: Thornton Park, MD;  Location: [facility];  Service: Gastroenterology;  Laterality: N/A;   COLONOSCOPY WITH PROPOFOL N/A 05/29/2021   Procedure: COLONOSCOPY WITH PROPOFOL;  Surgeon: Rush Landmark Telford Nab., MD;  Location: [facility];  Service: Gastroenterology;  Laterality: N/A;   DG AV DIALYSIS GRAFT DECLOT OR Left 05/17/2020   ENDOSCOPIC MUCOSAL RESECTION  05/29/2021   Procedure: ENDOSCOPIC MUCOSAL RESECTION;  Surgeon: Rush Landmark Telford Nab., MD;  Location: [facility];  Service: Gastroenterology;;  HEMOSTASIS CLIP PLACEMENT  05/29/2021   Procedure: HEMOSTASIS CLIP PLACEMENT;  Surgeon: Irving Copas., MD;  Location: [facility];  Service: Gastroenterology;;   IR FLUORO GUIDE CV LINE RIGHT  12/09/2019   IR US GUIDE VASC ACCESS RIGHT  12/09/2019   LEFT HEART CATH AND CORONARY ANGIOGRAPHY N/A 12/09/2019   Procedure: LEFT HEART CATH AND CORONARY ANGIOGRAPHY;  Surgeon: Adrian Prows, MD;  Location: [place] [facility];  Service: Cardiovascular;  Laterality: N/A;    POLYPECTOMY  04/06/2021   Procedure: POLYPECTOMY;  Surgeon: Thornton Park, MD;  [facility];  Service: Gastroenterology;;   POLYPECTOMY  05/29/2021   Procedure: POLYPECTOMY;  Surgeon: Irving Copas., MD;  Location: [facility];  Service: Gastroenterology;;   SUBMUCOSAL INJECTION  05/29/2021   Procedure: SUBMUCOSAL INJECTION;  Surgeon: Irving Copas., MD;  Location: [facility];  Service: Gastroenterology;;  EPI   SUBMUCOSAL LIFTING INJECTION  05/29/2021   Procedure: SUBMUCOSAL LIFTING INJECTION;  Surgeon: Irving Copas., MD;  Location: [facility];  Service: Gastroenterology;;  [place] INJECTION  04/06/2021   Procedure: SUBMUCOSAL TATTOO INJECTION;  Surgeon: Thornton Park, MD;  [facility]: [facility];  Service: Gastroenterology;;   UPPER EXTREMITY VENOGRAPHY Bilateral 08/24/2021   Procedure: UPPER EXTREMITY VENOGRAPHY;  Surgeon: Marty Heck, MD;  Location: [place] [facility];  Service: Cardiovascular;  Laterality: Bilateral;    MEDICATIONS: No current facility-administered medications for this encounter.    amoxicillin-clavulanate (AUGMENTIN) 875-125 MG tablet   calcium acetate (PHOSLO) 667 MG capsule   ibuprofen (ADVIL) 200 MG tablet   oxyCODONE (OXY IR/ROXICODONE) 5 MG immediate release tablet   oxyCODONE-acetaminophen (PERCOCET/ROXICET) 5-325 MG tablet   polyethylene glycol (MIRALAX) 17 g packet   PRESCRIPTION MEDICATION   ticagrelor (BRILINTA) 90 MG TABS tablet    Myra Gianotti, PA-C Surgical Short Stay/Anesthesiology [facility] Phone [phone] [facility] Phone [phone] 08/30/2021 1:01 PM

## 2021-08-31 ENCOUNTER — Other Ambulatory Visit: Payer: Self-pay

## 2021-08-31 ENCOUNTER — Ambulatory Visit (HOSPITAL_COMMUNITY): Payer: Medicare Other | Admitting: Vascular Surgery

## 2021-08-31 ENCOUNTER — Telehealth: Payer: Self-pay

## 2021-08-31 ENCOUNTER — Encounter (HOSPITAL_COMMUNITY)
Admission: RE | Disposition: A | Discharge status: Home / Self Care | Payer: Self-pay | Source: Home / Self Care | Attending: Vascular Surgery

## 2021-08-31 ENCOUNTER — Ambulatory Visit (HOSPITAL_COMMUNITY)
Admission: RE | Admit: 2021-08-31 | Discharge: 2021-08-31 | Disposition: A | Discharge status: Home / Self Care | Payer: Medicare Other | Attending: Vascular Surgery | Admitting: Vascular Surgery

## 2021-08-31 ENCOUNTER — Ambulatory Visit (HOSPITAL_BASED_OUTPATIENT_CLINIC_OR_DEPARTMENT_OTHER): Payer: Medicare Other | Admitting: Vascular Surgery

## 2021-08-31 ENCOUNTER — Encounter (HOSPITAL_COMMUNITY): Payer: Self-pay | Admitting: Vascular Surgery

## 2021-08-31 DIAGNOSIS — Y841 Kidney dialysis as the cause of abnormal reaction of the patient, or of later complication, without mention of misadventure at the time of the procedure: Secondary | ICD-10-CM | POA: Diagnosis not present

## 2021-08-31 DIAGNOSIS — T82590A Other mechanical complication of surgically created arteriovenous fistula, initial encounter: Secondary | ICD-10-CM

## 2021-08-31 DIAGNOSIS — Z992 Dependence on renal dialysis: Secondary | ICD-10-CM | POA: Insufficient documentation

## 2021-08-31 DIAGNOSIS — G4730 Sleep apnea, unspecified: Secondary | ICD-10-CM

## 2021-08-31 DIAGNOSIS — M1990 Unspecified osteoarthritis, unspecified site: Secondary | ICD-10-CM

## 2021-08-31 DIAGNOSIS — I120 Hypertensive chronic kidney disease with stage 5 chronic kidney disease or end stage renal disease: Secondary | ICD-10-CM | POA: Diagnosis not present

## 2021-08-31 DIAGNOSIS — T82511A Breakdown (mechanical) of surgically created arteriovenous shunt, initial encounter: Secondary | ICD-10-CM | POA: Insufficient documentation

## 2021-08-31 DIAGNOSIS — N186 End stage renal disease: Secondary | ICD-10-CM | POA: Insufficient documentation

## 2021-08-31 DIAGNOSIS — Z87891 Personal history of nicotine dependence: Secondary | ICD-10-CM | POA: Insufficient documentation

## 2021-08-31 DIAGNOSIS — T82898A Other specified complication of vascular prosthetic devices, implants and grafts, initial encounter: Secondary | ICD-10-CM | POA: Diagnosis not present

## 2021-08-31 DIAGNOSIS — N185 Chronic kidney disease, stage 5: Secondary | ICD-10-CM | POA: Diagnosis not present

## 2021-08-31 HISTORY — DX: Traumatic subdural hemorrhage with loss of consciousness status unknown, initial encounter: S06.5XAA

## 2021-08-31 HISTORY — DX: Cerebral aneurysm, nonruptured: I67.1

## 2021-08-31 HISTORY — DX: Gastro-esophageal reflux disease without esophagitis: K21.9

## 2021-08-31 HISTORY — PX: REVISION OF ARTERIOVENOUS GORETEX GRAFT: SHX6073

## 2021-08-31 LAB — POCT I-STAT, CHEM 8
BUN: 40 mg/dL — ABNORMAL HIGH (ref 6–20)
Calcium, Ion: 0.73 mmol/L — CL (ref 1.15–1.40)
Chloride: 105 mmol/L (ref 98–111)
Creatinine, Ser: 10.6 mg/dL — ABNORMAL HIGH (ref 0.61–1.24)
Glucose, Bld: 98 mg/dL (ref 70–99)
HCT: 41 % (ref 39.0–52.0)
Hemoglobin: 13.9 g/dL (ref 13.0–17.0)
Potassium: 4.6 mmol/L (ref 3.5–5.1)
Sodium: 135 mmol/L (ref 135–145)
TCO2: 25 mmol/L (ref 22–32)

## 2021-08-31 SURGERY — REVISION OF ARTERIOVENOUS GORETEX GRAFT
Anesthesia: Regional | Site: Arm Upper | Laterality: Left

## 2021-08-31 MED ORDER — HEPARIN SODIUM (PORCINE) 1000 UNIT/ML IJ SOLN
INTRAMUSCULAR | Status: AC
Start: 2021-08-31 — End: ?
  Filled 2021-08-31: qty 10

## 2021-08-31 MED ORDER — PHENYLEPHRINE 80 MCG/ML (10ML) SYRINGE FOR IV PUSH (FOR BLOOD PRESSURE SUPPORT)
PREFILLED_SYRINGE | INTRAVENOUS | Status: AC
  Filled 2021-08-31: qty 10

## 2021-08-31 MED ORDER — ORAL CARE MOUTH RINSE: 15.0000 mL | Freq: Once | OROMUCOSAL | Status: AC

## 2021-08-31 MED ORDER — AMISULPRIDE (ANTIEMETIC) 5 MG/2ML IV SOLN: 10.0000 mg | Freq: Once | INTRAVENOUS | Status: DC | PRN

## 2021-08-31 MED ORDER — HEPARIN SODIUM (PORCINE) 1000 UNIT/ML IJ SOLN
1.9000 mL | Freq: Once | INTRAMUSCULAR | Status: AC
  Administered 2021-08-31: 1900 [IU] via INTRAVENOUS

## 2021-08-31 MED ORDER — FENTANYL CITRATE (PF) 250 MCG/5ML IJ SOLN
INTRAMUSCULAR | Status: AC
  Filled 2021-08-31: qty 5

## 2021-08-31 MED ORDER — ACETAMINOPHEN 325 MG PO TABS: 325.0000 mg | ORAL_TABLET | ORAL | Status: DC | PRN

## 2021-08-31 MED ORDER — FENTANYL CITRATE (PF) 100 MCG/2ML IJ SOLN
INTRAMUSCULAR | Status: DC | PRN
  Administered 2021-08-31 (×4): 50 ug via INTRAVENOUS

## 2021-08-31 MED ORDER — PHENYLEPHRINE 80 MCG/ML (10ML) SYRINGE FOR IV PUSH (FOR BLOOD PRESSURE SUPPORT)
PREFILLED_SYRINGE | INTRAVENOUS | Status: DC | PRN
  Administered 2021-08-31: 160 ug via INTRAVENOUS
  Administered 2021-08-31 (×2): 80 ug via INTRAVENOUS

## 2021-08-31 MED ORDER — CHLORHEXIDINE GLUCONATE 4 % EX LIQD: 60.0000 mL | Freq: Once | CUTANEOUS | Status: DC

## 2021-08-31 MED ORDER — MIDAZOLAM HCL 5 MG/5ML IJ SOLN
INTRAMUSCULAR | Status: DC | PRN
  Administered 2021-08-31: 2 mg via INTRAVENOUS

## 2021-08-31 MED ORDER — FENTANYL CITRATE (PF) 100 MCG/2ML IJ SOLN: 25.0000 ug | INTRAMUSCULAR | Status: DC | PRN

## 2021-08-31 MED ORDER — PROPOFOL 10 MG/ML IV BOLUS
INTRAVENOUS | Status: AC
  Filled 2021-08-31: qty 20

## 2021-08-31 MED ORDER — STERILE WATER FOR IRRIGATION IR SOLN
Status: DC | PRN
  Administered 2021-08-31: 1000 mL

## 2021-08-31 MED ORDER — PHENYLEPHRINE HCL-NACL 20-0.9 MG/250ML-% IV SOLN
INTRAVENOUS | Status: DC | PRN
  Administered 2021-08-31: 50 ug/min via INTRAVENOUS

## 2021-08-31 MED ORDER — OXYCODONE-ACETAMINOPHEN 5-325 MG PO TABS: 1.0000 | ORAL_TABLET | Freq: Four times a day (QID) | ORAL | 0 refills | Status: DC | PRN

## 2021-08-31 MED ORDER — HEMOSTATIC AGENTS (NO CHARGE) OPTIME
TOPICAL | Status: DC | PRN
  Administered 2021-08-31: 1 via TOPICAL

## 2021-08-31 MED ORDER — MIDAZOLAM HCL 2 MG/2ML IJ SOLN
INTRAMUSCULAR | Status: AC
  Filled 2021-08-31: qty 2

## 2021-08-31 MED ORDER — ACETAMINOPHEN 160 MG/5ML PO SOLN: 325.0000 mg | ORAL | Status: DC | PRN

## 2021-08-31 MED ORDER — OXYCODONE HCL 5 MG PO TABS: 5.0000 mg | ORAL_TABLET | Freq: Once | ORAL | Status: DC | PRN

## 2021-08-31 MED ORDER — BUPIVACAINE-EPINEPHRINE (PF) 0.5% -1:200000 IJ SOLN
INTRAMUSCULAR | Status: DC | PRN
  Administered 2021-08-31: 30 mL via PERINEURAL

## 2021-08-31 MED ORDER — CEFAZOLIN SODIUM-DEXTROSE 2-4 GM/100ML-% IV SOLN
2.0000 g | INTRAVENOUS | Status: AC
  Administered 2021-08-31: 2 g via INTRAVENOUS
  Filled 2021-08-31: qty 100

## 2021-08-31 MED ORDER — OXYCODONE HCL 5 MG/5ML PO SOLN: 5.0000 mg | Freq: Once | ORAL | Status: DC | PRN

## 2021-08-31 MED ORDER — ACETAMINOPHEN 10 MG/ML IV SOLN: 1000.0000 mg | Freq: Once | INTRAVENOUS | Status: DC | PRN

## 2021-08-31 MED ORDER — HEPARIN SODIUM (PORCINE) 1000 UNIT/ML IJ SOLN
INTRAMUSCULAR | Status: DC | PRN
  Administered 2021-08-31: 5000 [IU] via INTRAVENOUS

## 2021-08-31 MED ORDER — 0.9 % SODIUM CHLORIDE (POUR BTL) OPTIME
TOPICAL | Status: DC | PRN
  Administered 2021-08-31 (×2): 1000 mL

## 2021-08-31 MED ORDER — SODIUM CHLORIDE 0.9 % IV SOLN: INTRAVENOUS | Status: DC

## 2021-08-31 MED ORDER — CHLORHEXIDINE GLUCONATE 0.12 % MT SOLN
15.0000 mL | Freq: Once | OROMUCOSAL | Status: AC
  Administered 2021-08-31: 15 mL via OROMUCOSAL
  Filled 2021-08-31: qty 15

## 2021-08-31 MED ORDER — HEPARIN 6000 UNIT IRRIGATION SOLUTION
Status: DC | PRN
  Administered 2021-08-31: 1

## 2021-08-31 MED ORDER — LIDOCAINE-EPINEPHRINE 1 %-1:200000 IJ SOLN
INTRAMUSCULAR | Status: AC
Start: 2021-08-31 — End: ?
  Filled 2021-08-31: qty 30

## 2021-08-31 MED ORDER — DIPHENHYDRAMINE HCL 50 MG/ML IJ SOLN
INTRAMUSCULAR | Status: DC | PRN
  Administered 2021-08-31: 12.5 mg via INTRAVENOUS

## 2021-08-31 MED ORDER — PROPOFOL 500 MG/50ML IV EMUL
INTRAVENOUS | Status: DC | PRN
  Administered 2021-08-31: 150 ug/kg/min via INTRAVENOUS

## 2021-08-31 SURGICAL SUPPLY — 51 items
ADH SKN CLS APL DERMABOND .7 (GAUZE/BANDAGES/DRESSINGS) ×1
ARMBAND PINK RESTRICT EXTREMIT (MISCELLANEOUS) ×2 IMPLANT
BAG COUNTER SPONGE SURGICOUNT (BAG) ×2 IMPLANT
BAG SPNG CNTER NS LX DISP (BAG) ×1
BNDG ELASTIC 4X5.8 VLCR STR LF (GAUZE/BANDAGES/DRESSINGS) ×1 IMPLANT
CANISTER SUCT 3000ML PPV (MISCELLANEOUS) ×2 IMPLANT
CLIP LIGATING EXTRA MED SLVR (CLIP) ×1 IMPLANT
CLIP LIGATING EXTRA SM BLUE (MISCELLANEOUS) ×1 IMPLANT
COVER PROBE W GEL 5X96 (DRAPES) ×2 IMPLANT
DERMABOND ADVANCED (GAUZE/BANDAGES/DRESSINGS) ×1
DERMABOND ADVANCED .7 DNX12 (GAUZE/BANDAGES/DRESSINGS) ×1 IMPLANT
DRSG DERMACEA NONADH 3X8 (GAUZE/BANDAGES/DRESSINGS) ×1 IMPLANT
ELECT REM PT RETURN 9FT ADLT (ELECTROSURGICAL) ×2
ELECTRODE REM PT RTRN 9FT ADLT (ELECTROSURGICAL) ×1 IMPLANT
GAUZE 4X4 16PLY ~~LOC~~+RFID DBL (SPONGE) ×1 IMPLANT
GLOVE BIO SURGEON STRL SZ 6.5 (GLOVE) ×5 IMPLANT
GLOVE BIO SURGEON STRL SZ7.5 (GLOVE) ×2 IMPLANT
GLOVE BIOGEL PI IND STRL 6 (GLOVE) IMPLANT
GLOVE BIOGEL PI IND STRL 7.0 (GLOVE) IMPLANT
GLOVE BIOGEL PI IND STRL 8 (GLOVE) ×1 IMPLANT
GLOVE BIOGEL PI INDICATOR 6 (GLOVE) ×1
GLOVE BIOGEL PI INDICATOR 7.0 (GLOVE) ×1
GLOVE BIOGEL PI INDICATOR 8 (GLOVE) ×1
GLOVE SRG 8 PF TXTR STRL LF DI (GLOVE) ×1 IMPLANT
GLOVE SURG UNDER POLY LF SZ8 (GLOVE) ×2
GOWN STRL REUS W/ TWL LRG LVL3 (GOWN DISPOSABLE) ×2 IMPLANT
GOWN STRL REUS W/TWL 2XL LVL3 (GOWN DISPOSABLE) ×2 IMPLANT
GOWN STRL REUS W/TWL LRG LVL3 (GOWN DISPOSABLE) ×4
GRAFT GORETEX STRT 4-7X45 (Vascular Products) ×1 IMPLANT
HEMOSTAT SNOW SURGICEL 2X4 (HEMOSTASIS) ×1 IMPLANT
KIT BASIN OR (CUSTOM PROCEDURE TRAY) ×2 IMPLANT
KIT TURNOVER KIT B (KITS) ×2 IMPLANT
NDL HYPO 25GX1X1/2 BEV (NEEDLE) ×1 IMPLANT
NEEDLE HYPO 25GX1X1/2 BEV (NEEDLE) ×2 IMPLANT
NS IRRIG 1000ML POUR BTL (IV SOLUTION) ×3 IMPLANT
PACK CV ACCESS (CUSTOM PROCEDURE TRAY) ×2 IMPLANT
PAD ARMBOARD 7.5X6 YLW CONV (MISCELLANEOUS) ×4 IMPLANT
SPONGE T-LAP 18X18 ~~LOC~~+RFID (SPONGE) ×2 IMPLANT
SUT MNCRL AB 4-0 PS2 18 (SUTURE) ×3 IMPLANT
SUT PROLENE 5 0 C 1 36 (SUTURE) ×1 IMPLANT
SUT PROLENE 6 0 BV (SUTURE) ×7 IMPLANT
SUT PROLENE 7 0 BV 1 (SUTURE) IMPLANT
SUT SILK 2 0 SH (SUTURE) ×1 IMPLANT
SUT VIC AB 2-0 CT1 27 (SUTURE) ×2
SUT VIC AB 2-0 CT1 TAPERPNT 27 (SUTURE) IMPLANT
SUT VIC AB 3-0 SH 27 (SUTURE) ×4
SUT VIC AB 3-0 SH 27X BRD (SUTURE) ×2 IMPLANT
TAPE UMBILICAL 1/8X30 (MISCELLANEOUS) ×1 IMPLANT
TOWEL GREEN STERILE (TOWEL DISPOSABLE) ×2 IMPLANT
UNDERPAD 30X36 HEAVY ABSORB (UNDERPADS AND DIAPERS) ×2 IMPLANT
WATER STERILE IRR 1000ML POUR (IV SOLUTION) ×2 IMPLANT

## 2021-08-31 NOTE — Anesthesia Procedure Notes (Signed)
Anesthesia Regional Block: Supraclavicular block   Pre-Anesthetic Checklist: , timeout performed,  Correct Patient, Correct Site, Correct Laterality,  Correct Procedure, Correct Position, site marked,  Risks and benefits discussed,  Surgical consent,  Pre-op evaluation,  At surgeon's request and post-op pain management  Laterality: Left  Prep: chloraprep       Needles:  Injection technique: Single-shot  Needle Type: Echogenic Stimulator Needle     Needle Length: 9cm  Needle Gauge: 21     Additional Needles:   Procedures:,,,, ultrasound used (permanent image in chart),,    Narrative:  Start time: 08/31/2021 7:00 AM End time: 08/31/2021 7:05 AM Injection made incrementally with aspirations every 5 mL.  Performed by: Personally  Anesthesiologist: Effie Berkshire, MD  Additional Notes: Patient tolerated the procedure well. Local anesthetic introduced in an incremental fashion under minimal resistance after negative aspirations. No paresthesias were elicited. After completion of the procedure, no acute issues were identified and patient continued to be monitored by RN.

## 2021-08-31 NOTE — Anesthesia Postprocedure Evaluation (Signed)
Anesthesia Post Note  Patient: [name]  Procedure(s) Performed: REDO LEFT UPPER ARM ARTERIOVENOUS GORETEX GRAFT (Left: Arm Upper)     Patient location during evaluation: PACU Anesthesia Type: Regional Level of consciousness: awake and alert Pain management: pain level controlled Vital Signs Assessment: post-procedure vital signs reviewed and stable Respiratory status: spontaneous breathing, nonlabored ventilation, respiratory function stable and patient connected to nasal cannula oxygen Cardiovascular status: stable and blood pressure returned to baseline Postop Assessment: no apparent nausea or vomiting Anesthetic complications: no   No notable events documented.  Last Vitals:  Vitals:   08/31/21 1025 08/31/21 1040  BP: 112/77 101/74  Pulse: 66 71  Resp: 13 14  Temp:  (!) 36.4 C  SpO2: 99% 97%    Last Pain:  Vitals:   08/31/21 1040  TempSrc:   PainSc: 0-No pain                 Effie Berkshire

## 2021-08-31 NOTE — H&P (Signed)
History and Physical Interval Note:  08/31/2021 7:28 AM  In short, the patient is a 46 year old male with multiple attempts at long-term HD access in the left upper extremity.  He most recently underwent left upper extremity venogram in an effort to define potential targets.  This demonstrated widely patent central venous system  On the right, he has some central stenosis.  After discussing the risk and benefits of redo left arm AV graft, Mr. Ritter Helsley to proceed.   Broadus John  Office Note        CC:  AVG malfunction - AVG clotted again after several thrombectomies.  Requesting Provider:  [name], FNP   HPI: Tylon Kemmerling is a 46 y.o. (1975/06/26) male presenting at the request of Dr. Johnney Ou, for AV graft malfunction. Patient has had multiple upper extremity permanent HD access attempts including 11/2019 left brachiobasilic fistula creation complicated by thrombosis, 03/2020 left upper extremity AV graft now thrombosed after multiple declots.  He is currently being dialyzed via a right-sided tunneled HD line.  On exam, [place] was doing well.  He had no complaints.  He does not want access in his right arm as he is right arm dominant.  He would like to pursue any and all options available in the left arm.  His previous surgeries have left him with some paresthesias in the left forearm.    Currently on the transplant list for the last year and a half.       Past Medical History:  Diagnosis Date   Gout     History of renal dialysis      M-W-F   HLD (hyperlipidemia)     Hypertension     Pneumonia     Polycystic kidney disease     Sleep apnea 07/29/2018    uses cpap nightly   Vitamin D deficiency 11/2018           Past Surgical History:  Procedure Laterality Date   AV FISTULA PLACEMENT Left 03/24/2020    Procedure: INSERTION OF LEFT UPPER EXTREMITY ARTERIOVENOUS (AV) GORE-TEX GRAFT;  Surgeon: Cherre Fernando Stoiber, MD;  Location: [place];  Service: Vascular;  Laterality:  Left;  PERIPHERAL NERVE BLOCK   [place] Left 12/11/2019    Procedure: 1ST STAGE BASILIC TRANSPOSITION OF LEFT ARM;  Surgeon: Angelia Mould, MD;  Location: [place];  Service: Vascular;  Laterality: Left;   BIOPSY   04/06/2021    Procedure: BIOPSY;  Surgeon: Thornton Park, MD;  [facility];  Service: Gastroenterology;;   COLONOSCOPY WITH PROPOFOL N/A 04/06/2021    Procedure: COLONOSCOPY WITH PROPOFOL;  Surgeon: Thornton Park, MD;  [facility];  Service: Gastroenterology;  Laterality: N/A;   COLONOSCOPY WITH PROPOFOL N/A 05/29/2021    Procedure: COLONOSCOPY WITH PROPOFOL;  Surgeon: Rush Landmark Telford Nab., MD;  [facility]: [facility];  Service: Gastroenterology;  Laterality: N/A;   DG AV DIALYSIS GRAFT DECLOT OR Left 05/17/2020   ENDOSCOPIC MUCOSAL RESECTION   05/29/2021    Procedure: ENDOSCOPIC MUCOSAL RESECTION;  Surgeon: Rush Landmark Telford Nab., MD;  Location: [facility];  Service: Gastroenterology;;   HEMOSTASIS CLIP PLACEMENT   05/29/2021    Procedure: HEMOSTASIS CLIP PLACEMENT;  Surgeon: Irving Copas., MD;  Location: [facility];  Service: Gastroenterology;;   IR FLUORO GUIDE CV LINE RIGHT   12/09/2019   IR US GUIDE VASC ACCESS RIGHT   12/09/2019   LEFT HEART CATH AND CORONARY ANGIOGRAPHY N/A 12/09/2019    Procedure: LEFT HEART CATH AND  CORONARY ANGIOGRAPHY;  Surgeon: Adrian Prows, MD;  Location: [place] [facility];  Service: Cardiovascular;  Laterality: N/A;   POLYPECTOMY   04/06/2021    Procedure: POLYPECTOMY;  Surgeon: Thornton Park, MD;  [facility];  Service: Gastroenterology;;   POLYPECTOMY   05/29/2021    Procedure: POLYPECTOMY;  Surgeon: Irving Copas., MD;  Location: [facility];  Service: Gastroenterology;;   SUBMUCOSAL INJECTION   05/29/2021    Procedure: SUBMUCOSAL INJECTION;  Surgeon: Irving Copas., MD;  Location: [facility];  Service: Gastroenterology;;  EPI   SUBMUCOSAL LIFTING  INJECTION   05/29/2021    Procedure: SUBMUCOSAL LIFTING INJECTION;  Surgeon: Irving Copas., MD;  Location: [facility];  Service: Gastroenterology;;  [place]   SUBMUCOSAL TATTOO INJECTION   04/06/2021    Procedure: SUBMUCOSAL TATTOO INJECTION;  Surgeon: Thornton Park, MD;  [facility];  Service: Gastroenterology;;      Social History         Socioeconomic History   Marital status: Single      Spouse name: Not on file   Number of children: 1   Years of education: Not on file   Highest education level: Not on file  Occupational History   Occupation: [employer]   Occupation: disabled  Tobacco Use   Smoking status: Former      Packs/day: 0.20      Years: 10.00      Pack years: 2.00      Types: Cigarettes      Quit date: 2015      Years since quitting: 8.3   Smokeless tobacco: Never  Vaping Use   Vaping Use: Never used  Substance and Sexual Activity   Alcohol use: Yes      Comment: occasional wine   Drug use: No   Sexual activity: Yes  Other Topics Concern   Not on file  Social History Narrative   Not on file    Social Determinants of Health       Financial Resource Strain: High Risk   Difficulty of Paying Living Expenses: Very hard  Food Insecurity: No Food Insecurity   Worried About Charity fundraiser in the Last Year: Never true   Ran Out of Food in the Last Year: Never true  Transportation Needs: No Transportation Needs   Lack of Transportation (Medical): No   Lack of Transportation (Non-Medical): No  Physical Activity: Insufficiently Active   Days of Exercise per Week: 3 days   Minutes of Exercise per Session: 30 min  Stress: Stress Concern Present   Feeling of Stress : To some extent  Social Connections: Moderately Integrated   Frequency of Communication with Friends and Family: More than three times a week   Frequency of Social Gatherings with Friends and Family: Once a week   Attends Religious Services: 1 to 4 times per year    Active Member of [employer] or Organizations: Yes   Attends Archivist Meetings: 1 to 4 times per year   Marital Status: Never married  Human resources officer Violence: Not At Risk   Fear of Current or Ex-Partner: No   Emotionally Abused: No   Physically Abused: No   Sexually Abused: No         Family History  Problem Relation Age of Onset   Diabetes Mother     Ovarian cancer Mother     Bipolar disorder Father     Hypertension Father     Prostate cancer Father  Prostate cancer Paternal Grandfather     Polycystic kidney disease Neg Hx              Current Outpatient Medications  Medication Sig Dispense Refill   acetaminophen (TYLENOL) 500 MG tablet Take 1,000 mg by mouth every 6 (six) hours as needed for mild pain.       amoxicillin-clavulanate (AUGMENTIN) 875-125 MG tablet Take 1 tablet by mouth every 12 (twelve) hours. (Patient not taking: Reported on 07/28/2021) 14 tablet 0   calcium acetate (PHOSLO) 667 MG capsule Take 667-2,001 mg by mouth See admin instructions. Take 1 capsule (667 mg) by mouth with large snacks & take 3 capsules (2001 mg) by mouth with each meal (Patient not taking: Reported on 07/28/2021)       lidocaine-prilocaine (EMLA) cream Apply 1 application topically every Monday, Wednesday, and Friday with hemodialysis. Prior to site being accessed before dialysis (Patient not taking: Reported on 07/28/2021)       Multiple Vitamin (MULTIVITAMIN WITH MINERALS) TABS tablet Take 1 tablet by mouth in the morning.       oxyCODONE (OXY IR/ROXICODONE) 5 MG immediate release tablet Take 1 tablet (5 mg total) by mouth every 6 (six) hours as needed for severe pain. 6 tablet 0   oxyCODONE-acetaminophen (PERCOCET/ROXICET) 5-325 MG tablet Take 1 tablet by mouth every 6 (six) hours as needed for severe pain. 5 tablet 0   polyethylene glycol (MIRALAX) 17 g packet Take 17 g by mouth daily. (Patient taking differently: Take 17 g by mouth daily as needed (constipation.).) 14 each 0    PRESCRIPTION MEDICATION Inhale into the lungs at bedtime. CPAP       ticagrelor (BRILINTA) 90 MG TABS tablet Take 1 tablet (90 mg total) by mouth 2 (two) times daily. 60 tablet 0    No current facility-administered medications for this visit.          Allergies  Allergen Reactions   Ivp Dye [Iodinated Contrast Media] Itching        REVIEW OF SYSTEMS:  '[X]'$  denotes positive finding, '[ ]'$  denotes negative finding Cardiac   Comments:  Chest pain or chest pressure:      Shortness of breath upon exertion:      Short of breath when lying flat:      Irregular heart rhythm:             Vascular      Pain in calf, thigh, or hip brought on by ambulation:      Pain in feet at night that wakes you up from your sleep:       Blood clot in your veins:      Leg swelling:              Pulmonary      Oxygen at home:      Productive cough:       Wheezing:              Neurologic      Sudden weakness in arms or legs:       Sudden numbness in arms or legs:       Sudden onset of difficulty speaking or slurred speech:      Temporary loss of vision in one eye:       Problems with dizziness:              Gastrointestinal      Blood in stool:       Vomited blood:  Genitourinary      Burning when urinating:       Blood in urine:             Psychiatric      Major depression:              Hematologic      Bleeding problems:      Problems with blood clotting too easily:             Skin      Rashes or ulcers:             Constitutional      Fever or chills:          PHYSICAL EXAMINATION:   There were no vitals filed for this visit.   General:  WDWN in NAD; vital signs documented above Gait: Not observed HENT: WNL, normocephalic Pulmonary: normal non-labored breathing , without wheezing Cardiac: regular HR, Abdomen: soft, NT, no masses Skin: without rashes Vascular Exam/Pulses:   Right Left  Radial 2+ (normal) 2+ (normal)                                        Extremities: without ischemic changes, without Gangrene , without cellulitis; without open wounds;  Musculoskeletal: no muscle wasting or atrophy       Neurologic: A&O X 3;  No focal weakness or paresthesias are detected Psychiatric:  The pt has Normal affect.     Non-Invasive Vascular Imaging:   +-----------------+-------------+----------+--------------+  Left Cephalic    Diameter (cm)Depth (cm)   Findings     +-----------------+-------------+----------+--------------+  Shoulder                                not visualized  +-----------------+-------------+----------+--------------+  Prox upper arm                          not visualized  +-----------------+-------------+----------+--------------+  Mid upper arm        0.19        0.63                   +-----------------+-------------+----------+--------------+  Dist upper arm       0.19        0.54                   +-----------------+-------------+----------+--------------+  Antecubital fossa    0.11        0.45                   +-----------------+-------------+----------+--------------+  Prox forearm         0.37        0.48                   +-----------------+-------------+----------+--------------+  Mid forearm          0.35        0.31                   +-----------------+-------------+----------+--------------+  Dist forearm         0.36        0.25                   +-----------------+-------------+----------+--------------+  Wrist  0.28        0.22                   +-----------------+-------------+----------+--------------+      Previous left basilic vein transposition and left brachial artery to  brachial vein AVG are occluded.        ASSESSMENT/PLAN: Kellon Chalk is a 46 y.o. male presenting in need of long-term HD access.  He is had previous left-sided brachiobasilic, left-sided brachial-Ax AVG, both thrombosed. Gautam is not interested in right  arm access, and asked that everything be done for left arm access.   He and I discussed the need for bilateral upper extremity venogram in an effort to determine if outflow stenosis is the reason for his most recent graft thrombosis.  Venogram would also allow me to define a vein location for possible redo brachial to axillary vein bypass graft.   Dialysis days are Monday Wednesday Friday, I will schedule him with one of my partners for bilateral upper extremity venogram with plans to return to clinic in 2 weeks for surgical discussion pending results.   I asked that he call my office should any questions or concerns arise.     Broadus John, MD [facility] [phone]

## 2021-08-31 NOTE — Transfer of Care (Signed)
Immediate Anesthesia Transfer of Care Note  Patient: Michael [name]  Procedure(s) Performed: REDO LEFT UPPER ARM ARTERIOVENOUS GORETEX GRAFT (Left: Arm Upper)  Patient Location: PACU  Anesthesia Type:MAC  Level of Consciousness: awake and alert   Airway & Oxygen Therapy: Patient Spontanous Breathing and Patient connected to face mask oxygen  Post-op Assessment: Report given to RN and Post -op Vital signs reviewed and stable  Post vital signs: Reviewed and stable  Last Vitals:  Vitals Value Taken Time  BP 112/69 08/31/21 1010  Temp    Pulse 63 08/31/21 1014  Resp 14 08/31/21 1014  SpO2 100 % 08/31/21 1014  Vitals shown include unvalidated device data.  Last Pain:  Vitals:   08/31/21 0556  TempSrc:   PainSc: 0-No pain      Patients Stated Pain Goal: 0 (47/84/12 8208)  Complications: No notable events documented.

## 2021-08-31 NOTE — Op Note (Signed)
NAME: Michael [name]    MRN: [id number] DOB: 1975-06-11    DATE OF OPERATION: 08/31/2021  PREOP DIAGNOSIS:    End-stage renal disease requiring dialysis  POSTOP DIAGNOSIS:    Same  PROCEDURE:    Left brachial artery to axillary vein arteriovenous graft  SURGEON: Broadus John  ASSIST: Paulo Fruit, PA  ANESTHESIA: Moderate, block  EBL: 25 mL  INDICATIONS:    Michael [name] is a 46 y.o. male with multiple failed access attempts in the left lower extremity.  Most recently, he underwent left-sided venography in an effort to assess for central stenosis after failed AV graft.  There was no central stenosis appreciated on the left, and the patient asked for left-sided graft as he is right-handed.  At the time of his left graft, he had symptoms of steal, albeit these were mild.  After discussing the risk and benefits of repeat left-sided arteriovenous graft creation, Alban elected to proceed.  FINDINGS:   4 mm brachial artery, 8 mm proximal axillary vein  TECHNIQUE:   After informed consent was obtained, the patient was brought to the operating room, placed supine on the operating room table.  Moderate anesthesia was induced the patient was prepped and draped in normal sterile fashion.  Surgical time-out was taken. Pre-op antibiotics were given.  The procedure began with ultrasound insonation of the brachial artery and axillary vein.  A thrombosed stent was appreciated in the peripheral portion of the axillary vein.  There appeared to be a paired axillary vein which was patent on imaging.  A longitudinal incision was made in the axilla and axillary vein exposed in standard fashion.  This proved deep due to the patient's large size.  This was controlled with a Silastic loop.    Next, I moved to the mid humerus for exposure of the brachial artery.  I chose this location as it was more proximal than previous incisions which allowed me to stay out of previous surgical areas as well as  allowed for more proximalization in an effort to reduce the risk of steal syndrome. The brachial artery was identified, gently dissected, and encircled with silastic loops.   A tunneling device was used in the subcutaneous plane with a counterincision on the lateral aspect of the left arm.  The patient was heparinized.  I elected to sew the vein anastomosis first in an effort to ensure the fistula laid properly as he has had multiple issues with fistula failure.  A longitudinal venotomy was made.  Graft beveled.  This was sewn in end-to-side fashion using running 6-0 Prolene suture.  Next, I moved to the brachial anastomosis.  The vein was threaded through the tunneler.  A longitudinal arteriotomy was made and graft beveled.  This was also sewn in similar and side fashion.  This anastomosis at case completion, there was a light thrill within the bypass graft, with a signal in the radial artery.  This augmented significantly with compression of the AV graft.  The patient had small arteries and had poor venous outflow prior to graft placement.  I am concerned for both steal syndrome as he has had a previously as well as left upper extremity swelling.  Should he have swelling, this would necessitate fistula ligation.  Should he have steal symptoms, we would discuss ligation versus proximalization to the axillary artery.  Hemostasis was found to be satisfactory, and all wounds were irrigated with saline solution and closed in layers with vicryl and Monocryl at the skin.  A dry sterile dressing was applied. Anesthetic care was terminated. The patient was transported to the recovery room in stable condition.  Given the complexity of the case,  the assistant was necessary in order to expedient the procedure and safely perform the technical aspects of the operation.  The assistant provided traction and countertraction to assist with exposure of the artery and vein.  They also assisted with suture ligation of multiple  venous branches.  They played a critical role in the anastomosis. These skills, especially following the Prolene suture for the anastomosis, could not have been adequately performed by a scrub tech assistant.    Macie Burows, MD [facility]  DATE OF DICTATION:   08/31/2021

## 2021-08-31 NOTE — Anesthesia Procedure Notes (Signed)
Procedure Name: MAC Date/Time: 08/31/2021 7:47 AM  Performed by: Lieutenant Diego, CRNAPre-anesthesia Checklist: Patient identified, Emergency Drugs available, Suction available, Patient being monitored and Timeout performed Patient Re-evaluated:Patient Re-evaluated prior to induction Oxygen Delivery Method: Simple face mask Preoxygenation: Pre-oxygenation with 100% oxygen Induction Type: IV induction

## 2021-08-31 NOTE — Progress Notes (Signed)
Dr. Smith Robert made aware of calcium, ion 0.73 ISTAT result.

## 2021-08-31 NOTE — Discharge Instructions (Signed)
[facility]  Discharge Instructions  AV Fistula or Graft Surgery for Dialysis Access  Please refer to the following instructions for your post-procedure care. Your surgeon or physician assistant will discuss any changes with you.  Activity  You may drive the day following your surgery, if you are comfortable and no longer taking prescription pain medication. Resume full activity as the soreness in your incision resolves.  Bathing/Showering  You may shower after you go home. Keep your incision dry for 48 hours. Do not soak in a bathtub, hot tub, or swim until the incision heals completely. You may not shower if you have a hemodialysis catheter.  Incision Care  Clean your incision with mild soap and water after 48 hours. Pat the area dry with a clean towel. You do not need a bandage unless otherwise instructed. Do not apply any ointments or creams to your incision. You may have skin glue on your incision. Do not peel it off. It will come off on its own in about one week. Your arm may swell a bit after surgery. To reduce swelling use pillows to elevate your arm so it is above your heart. Your doctor will tell you if you need to lightly wrap your arm with an ACE bandage.  Diet  Resume your normal diet. There are not special food restrictions following this procedure. In order to heal from your surgery, it is CRITICAL to get adequate nutrition. Your body requires vitamins, minerals, and protein. Vegetables are the best source of vitamins and minerals. Vegetables also provide the perfect balance of protein. Processed food has little nutritional value, so try to avoid this.  Medications  Resume taking all of your medications. If your incision is causing pain, you may take over-the counter pain relievers such as acetaminophen (Tylenol). If you were prescribed a stronger pain medication, please be aware these medications can cause nausea and constipation. Prevent  nausea by taking the medication with a snack or meal. Avoid constipation by drinking plenty of fluids and eating foods with high amount of fiber, such as fruits, vegetables, and grains.  Do not take Tylenol if you are taking prescription pain medications.  Follow up Your surgeon may want to see you in the office following your access surgery. If so, this will be arranged at the time of your surgery.  Please call us immediately for any of the following conditions:  Increased pain, redness, drainage (pus) from your incision site Fever of 101 degrees or higher Severe or worsening pain at your incision site Hand pain or numbness.  Reduce your risk of vascular disease:  Stop smoking. If you would like help, call [facility] at 1-800-QUIT-NOW 431 519 9229) or [place] at [place] your cholesterol Maintain a desired weight Control your diabetes Keep your blood pressure down  Dialysis  It will take several weeks to several months for your new dialysis access to be ready for use. Your surgeon will determine when it is okay to use it. Your nephrologist will continue to direct your dialysis. You can continue to use your Permcath until your new access is ready for use.   08/31/2021 Keidan Aumiller [id number] 1976/03/08  Surgeon(s): Broadus John, MD  Procedure(s): REDO LEFT UPPER ARM ARTERIOVENOUS GORETEX GRAFT   May stick graft immediately   May stick graft on designated area only:   X Do not stick left AV Graft for 4 weeks    If you have any questions, please call the office  at [phone].

## 2021-08-31 NOTE — Progress Notes (Signed)
Orthopedic Tech Progress Note Patient Details:  Abhimanyu Cruces Apr 12, 1975 [id number]  PACU RN called requesting an ARM SLING for patient   Ortho Devices Type of Ortho Device: Arm sling Ortho Device/Splint Location: LUE Ortho Device/Splint Interventions: Ordered   Post Interventions Patient Tolerated: Well Instructions Provided: Care of device  Janit Pagan 08/31/2021, 10:50 AM

## 2021-09-01 ENCOUNTER — Encounter (HOSPITAL_COMMUNITY): Payer: Self-pay | Admitting: Vascular Surgery

## 2021-09-01 DIAGNOSIS — R209 Unspecified disturbances of skin sensation: Secondary | ICD-10-CM | POA: Diagnosis not present

## 2021-09-01 DIAGNOSIS — D689 Coagulation defect, unspecified: Secondary | ICD-10-CM | POA: Diagnosis not present

## 2021-09-01 DIAGNOSIS — D631 Anemia in chronic kidney disease: Secondary | ICD-10-CM | POA: Diagnosis not present

## 2021-09-01 DIAGNOSIS — D509 Iron deficiency anemia, unspecified: Secondary | ICD-10-CM | POA: Diagnosis not present

## 2021-09-01 DIAGNOSIS — T7840XA Allergy, unspecified, initial encounter: Secondary | ICD-10-CM | POA: Diagnosis not present

## 2021-09-01 DIAGNOSIS — R52 Pain, unspecified: Secondary | ICD-10-CM | POA: Diagnosis not present

## 2021-09-01 DIAGNOSIS — Z992 Dependence on renal dialysis: Secondary | ICD-10-CM | POA: Diagnosis not present

## 2021-09-01 DIAGNOSIS — N2581 Secondary hyperparathyroidism of renal origin: Secondary | ICD-10-CM | POA: Diagnosis not present

## 2021-09-01 DIAGNOSIS — N186 End stage renal disease: Secondary | ICD-10-CM | POA: Diagnosis not present

## 2021-09-01 DIAGNOSIS — T782XXA Anaphylactic shock, unspecified, initial encounter: Secondary | ICD-10-CM | POA: Diagnosis not present

## 2021-09-03 ENCOUNTER — Encounter (HOSPITAL_BASED_OUTPATIENT_CLINIC_OR_DEPARTMENT_OTHER): Payer: Self-pay

## 2021-09-03 ENCOUNTER — Other Ambulatory Visit: Payer: Self-pay

## 2021-09-03 ENCOUNTER — Emergency Department (HOSPITAL_BASED_OUTPATIENT_CLINIC_OR_DEPARTMENT_OTHER)
Admission: EM | Admit: 2021-09-03 | Discharge: 2021-09-03 | Disposition: A | Discharge status: Home / Self Care | Payer: Medicare Other | Attending: Emergency Medicine | Admitting: Emergency Medicine

## 2021-09-03 DIAGNOSIS — M79642 Pain in left hand: Secondary | ICD-10-CM | POA: Diagnosis not present

## 2021-09-03 DIAGNOSIS — R200 Anesthesia of skin: Secondary | ICD-10-CM | POA: Insufficient documentation

## 2021-09-03 DIAGNOSIS — M79602 Pain in left arm: Secondary | ICD-10-CM

## 2021-09-03 LAB — CBC WITH DIFFERENTIAL/PLATELET
Abs Immature Granulocytes: 0.05 10*3/uL (ref 0.00–0.07)
Basophils Absolute: 0.1 10*3/uL (ref 0.0–0.1)
Basophils Relative: 1 %
Eosinophils Absolute: 0.5 10*3/uL (ref 0.0–0.5)
Eosinophils Relative: 5 %
HCT: 36.2 % — ABNORMAL LOW (ref 39.0–52.0)
Hemoglobin: 11.4 g/dL — ABNORMAL LOW (ref 13.0–17.0)
Immature Granulocytes: 1 %
Lymphocytes Relative: 21 %
Lymphs Abs: 2.2 10*3/uL (ref 0.7–4.0)
MCH: 29 pg (ref 26.0–34.0)
MCHC: 31.5 g/dL (ref 30.0–36.0)
MCV: 92.1 fL (ref 80.0–100.0)
Monocytes Absolute: 0.7 10*3/uL (ref 0.1–1.0)
Monocytes Relative: 7 %
Neutro Abs: 7 10*3/uL (ref 1.7–7.7)
Neutrophils Relative %: 65 %
Platelets: 197 10*3/uL (ref 150–400)
RBC: 3.93 MIL/uL — ABNORMAL LOW (ref 4.22–5.81)
RDW: 15 % (ref 11.5–15.5)
WBC: 10.6 10*3/uL — ABNORMAL HIGH (ref 4.0–10.5)
nRBC: 0 % (ref 0.0–0.2)

## 2021-09-03 LAB — BASIC METABOLIC PANEL
Anion gap: 15 (ref 5–15)
BUN: 57 mg/dL — ABNORMAL HIGH (ref 6–20)
CO2: 27 mmol/L (ref 22–32)
Calcium: 9.4 mg/dL (ref 8.9–10.3)
Chloride: 96 mmol/L — ABNORMAL LOW (ref 98–111)
Creatinine, Ser: 13.64 mg/dL — ABNORMAL HIGH (ref 0.61–1.24)
GFR, Estimated: 4 mL/min — ABNORMAL LOW (ref >60)
Glucose, Bld: 81 mg/dL (ref 70–99)
Potassium: 6 mmol/L — ABNORMAL HIGH (ref 3.5–5.1)
Sodium: 138 mmol/L (ref 135–145)

## 2021-09-03 MED ORDER — MORPHINE SULFATE (PF) 4 MG/ML IV SOLN: 4.0000 mg | Freq: Once | INTRAVENOUS | Status: AC

## 2021-09-03 MED ORDER — OXYCODONE-ACETAMINOPHEN 10-325 MG PO TABS: 1.0000 | ORAL_TABLET | Freq: Four times a day (QID) | ORAL | 0 refills | Status: DC | PRN

## 2021-09-03 MED ORDER — ONDANSETRON HCL 4 MG/2ML IJ SOLN
4.0000 mg | Freq: Once | INTRAMUSCULAR | Status: AC
  Administered 2021-09-03: 4 mg via INTRAVENOUS
  Filled 2021-09-03: qty 2

## 2021-09-03 MED ORDER — MORPHINE SULFATE (PF) 4 MG/ML IV SOLN
4.0000 mg | Freq: Once | INTRAVENOUS | Status: AC
  Administered 2021-09-03: 4 mg via INTRAVENOUS
  Filled 2021-09-03: qty 1

## 2021-09-03 MED ORDER — MORPHINE SULFATE (PF) 4 MG/ML IV SOLN
INTRAVENOUS | Status: AC
  Administered 2021-09-03: 4 mg via INTRAVENOUS
  Filled 2021-09-03: qty 1

## 2021-09-03 NOTE — ED Notes (Signed)
DC instructions reviewed with pt. PT verbalized understanding. Pt provided cab voucher to return to [facility] to pick up car after being tx by [facility].  PT DC.

## 2021-09-03 NOTE — ED Provider Notes (Signed)
[facility] EMERGENCY DEPT Provider Note   CSN: [id number] Arrival date & time: 09/03/21  1102     History  Chief Complaint  Patient presents with   Vascular Access Problem    Michael [name] is a 46 y.o. male.  Patient presents chief complaint left hand swelling and pain and numbness to the thumb and index finger.  Symptoms ongoing for the past 4 days or so.  Denies any fevers or vomiting or cough or diarrhea.       Home Medications Prior to Admission medications   Medication Sig Start Date End Date Taking? Authorizing Provider  calcium acetate (PHOSLO) 667 MG capsule Take 1,334-2,001 mg by mouth See admin instructions. Take 1334 to 2001 mg with each meal (based on the size) 12/12/20   [provider]  ibuprofen (ADVIL) 200 MG tablet Take 600 mg by mouth every 6 (six) hours as needed for moderate pain.    [provider]  oxyCODONE-acetaminophen (PERCOCET) 5-325 MG tablet Take 1 tablet by mouth every 6 (six) hours as needed for severe pain. 08/31/21 08/31/22  Baglia, Corrina, PA-C  polyethylene glycol (MIRALAX) 17 g packet Take 17 g by mouth daily. Patient taking differently: Take 17 g by mouth daily as needed (constipation.). 02/23/21   Sharyn Creamer, MD  PRESCRIPTION MEDICATION Inhale into the lungs at bedtime. CPAP    [provider]  isosorbide dinitrate (ISORDIL) 10 MG tablet Take 1 tablet (10 mg total) by mouth 2 (two) times daily. Patient not taking: No sig reported 02/04/20 03/24/20  [name], FNP      Allergies    Ivp dye [iodinated contrast media]    Review of Systems   Review of Systems  Constitutional:  Negative for fever.  HENT:  Negative for ear pain and sore throat.   Eyes:  Negative for pain.  Respiratory:  Negative for cough.   Cardiovascular:  Negative for chest pain.  Gastrointestinal:  Negative for abdominal pain.  Genitourinary:  Negative for flank pain.  Musculoskeletal:  Negative for back pain.  Skin:   Negative for color change and rash.  Neurological:  Negative for syncope.  All other systems reviewed and are negative.   Physical Exam Updated Vital Signs BP 121/79   Pulse 81   Temp (!) 96.7 F (35.9 C) (Temporal)   Resp 17   Ht '6\' 2"'$  (1.88 m)   Wt 115.7 kg   SpO2 99%   BMI 32.75 kg/m  Physical Exam Constitutional:      Appearance: He is well-developed.  HENT:     Head: Normocephalic.     Nose: Nose normal.  Eyes:     Extraocular Movements: Extraocular movements intact.  Cardiovascular:     Rate and Rhythm: Normal rate.  Pulmonary:     Effort: Pulmonary effort is normal.  Musculoskeletal:     Comments: Left upper arm swollen tender to palpation stitches intact no evidence of cellulitis noted.  No palpable thrill, but bruits present on auscultation.  Distal radial pulses difficult to find and faint but palpable.  Normal grip strength.  Skin:    Coloration: Skin is not jaundiced.  Neurological:     Mental Status: He is alert. Mental status is at baseline.     ED Results / Procedures / Treatments   Labs (all labs ordered are listed, but only abnormal results are displayed) Labs Reviewed  CBC WITH DIFFERENTIAL/PLATELET - Abnormal; Notable for the following components:      Result Value  WBC 10.6 (*)    RBC 3.93 (*)    Hemoglobin 11.4 (*)    HCT 36.2 (*)    All other components within normal limits  BASIC METABOLIC PANEL - Abnormal; Notable for the following components:   Potassium 6.0 (*)    Chloride 96 (*)    BUN 57 (*)    Creatinine, Ser 13.64 (*)    GFR, Estimated 4 (*)    All other components within normal limits    EKG None  Radiology No results found.  Procedures Procedures    Medications Ordered in ED Medications  morphine (PF) 4 MG/ML injection 4 mg (has no administration in time range)  morphine (PF) 4 MG/ML injection 4 mg (4 mg Intravenous Given 09/03/21 1307)  ondansetron (ZOFRAN) injection 4 mg (4 mg Intravenous Given 09/03/21 1306)     ED Course/ Medical Decision Making/ A&P                           Medical Decision Making Amount and/or Complexity of Data Reviewed Labs: ordered.  Risk Prescription drug management.   Review of records, surgery on August 31, 2021 of left upper extremity.  Cardiac monitoring showing sinus rhythm.  Work-up shows potassium 6.0.  Case discussed with vascular surgery Dr.Hawkins, will be evaluated at [facility].        Final Clinical Impression(s) / ED Diagnoses Final diagnoses:  Pain and numbness of left upper extremity    Rx / DC Orders ED Discharge Orders     None         Luna Fuse, MD 09/03/21 1404

## 2021-09-03 NOTE — ED Triage Notes (Signed)
Patietn here POV from Home.  Endorses having Swelling to Left Arm and Numbness to First and Second Digit on Left Hand since Operation to have Graft placed on Thursday.   No Missed Sessions (MWF). Pulses Palpable to Extremity but Digits comparatively colder when compared to Right Digits.  NAD Noted during Triage. A&Ox4. GCS 15. Ambulatory.

## 2021-09-03 NOTE — Consult Note (Signed)
[facility] OF [place]  ASSESSMENT / PLAN: 46 y.o. male status post left arm arteriovenous graft 08/31/2021.  The patient has had swelling and pain in the surgical site since the operation.  He has noted coolness and numbness of the first and second digit of the left arm.  I think he has fairly typical postoperative discomfort and some mild steal symptoms.  I counseled him extensively about our options going forward.  Unfortunately, excision of the graft is his only option for relieving all of these symptoms.  He would need to then pursue access in the right upper extremity.  He strongly desires left-sided access.  I encouraged him to try to manage these symptoms expectantly.  I will prescribe him a stronger narcotic.  We will see him in the office in about a week to monitor his symptoms.  He is safe for discharge from my standpoint.  CHIEF COMPLAINT: Pain after surgery  HISTORY OF PRESENT ILLNESS: Michael [name] is a 46 y.o. male known to our service with end-stage renal disease dialyzing Monday, Wednesday, and Friday via right internal jugular tunneled dialysis catheter.  The patient has had multiple access surgeries in the past.  He had a failed left arm basilic vein fistula.  I then placed a arteriovenous graft for him in early 2022.  This worked for about a year and then started failing.  Dr. Virl Cagey saw him in the clinic 08/04/2021.  Upper arm venography was performed.  A redo left arm AV graft was then offered and performed on 08/31/2021.  At operation, Dr. Virl Cagey noted diminutive brachial artery and significant augmentation of the distal vascular Doppler flow with compression of the arteriovenous graft.  He was concerned the patient would have steal syndrome.  The patient presented to his dialysis appointments and his dialysis nurses became concerned about the appearance of the arm and his symptoms.  They encouraged him to present to the ER for further evaluation.  On my  evaluation, the patient is in no distress.  He reports discomfort about the tunnel tract of the arteriovenous graft he reports some numbness and coolness in the left first and second digit which will occasionally migrate to the remainder of the digits.  Past Medical History:  Diagnosis Date   GERD (gastroesophageal reflux disease)    Gout    History of renal dialysis    M-W-F   HLD (hyperlipidemia)    Hypertension    Intracranial aneurysm    s/p coil embolization: right middle meningeal artery 09/08/20, pericallosal artery 09/22/20, right MCA 12/22/20   Pneumonia    Polycystic kidney disease    SDH (subdural hematoma) ([place])    subacute right SDH 08/23/20 CTA head   Sleep apnea 07/29/2018   uses cpap nightly   Vitamin D deficiency 11/2018    Past Surgical History:  Procedure Laterality Date   AV FISTULA PLACEMENT Left 03/24/2020   Procedure: INSERTION OF LEFT UPPER EXTREMITY ARTERIOVENOUS (AV) GORE-TEX GRAFT;  Surgeon: [name], MD;  Location: [facility];  Service: Vascular;  Laterality: Left;  PERIPHERAL NERVE BLOCK   [place] Left 12/11/2019   Procedure: 1ST STAGE BASILIC TRANSPOSITION OF LEFT ARM;  Surgeon: Angelia Mould, MD;  Location: [facility];  Service: Vascular;  Laterality: Left;   BIOPSY  04/06/2021   Procedure: BIOPSY;  Surgeon: Thornton Park, MD;  Location: [facility];  Service: Gastroenterology;;   COLONOSCOPY WITH PROPOFOL N/A 04/06/2021   Procedure: COLONOSCOPY WITH PROPOFOL;  Surgeon: Thornton Park,  MD;  [facility];  Service: Gastroenterology;  Laterality: N/A;   COLONOSCOPY WITH PROPOFOL N/A 05/29/2021   Procedure: COLONOSCOPY WITH PROPOFOL;  Surgeon: Rush Landmark Telford Nab., MD;  [facility];  Service: Gastroenterology;  Laterality: N/A;   DG AV DIALYSIS GRAFT DECLOT OR Left 05/17/2020   ENDOSCOPIC MUCOSAL RESECTION  05/29/2021   Procedure: ENDOSCOPIC MUCOSAL RESECTION;  Surgeon: Rush Landmark Telford Nab., MD;   Location: [facility];  Service: Gastroenterology;;   HEMOSTASIS CLIP PLACEMENT  05/29/2021   Procedure: HEMOSTASIS CLIP PLACEMENT;  Surgeon: Irving Copas., MD;  Location: [facility];  Service: Gastroenterology;;   IR FLUORO GUIDE CV LINE RIGHT  12/09/2019   IR US GUIDE VASC ACCESS RIGHT  12/09/2019   LEFT HEART CATH AND CORONARY ANGIOGRAPHY N/A 12/09/2019   Procedure: LEFT HEART CATH AND CORONARY ANGIOGRAPHY;  Surgeon: Adrian Prows, MD;  Location: [facility];  Service: Cardiovascular;  Laterality: N/A;   POLYPECTOMY  04/06/2021   Procedure: POLYPECTOMY;  Surgeon: Thornton Park, MD;  [facility];  Service: Gastroenterology;;   POLYPECTOMY  05/29/2021   Procedure: POLYPECTOMY;  Surgeon: Irving Copas., MD;  Location: [facility];  Service: Gastroenterology;;   REVISION OF ARTERIOVENOUS GORETEX GRAFT Left 08/31/2021   Procedure: REDO LEFT UPPER ARM ARTERIOVENOUS GORETEX GRAFT;  Surgeon: Broadus John, MD;  Location: [facility];  Service: Vascular;  Laterality: Left;  [place] INJECTION  05/29/2021   Procedure: SUBMUCOSAL INJECTION;  Surgeon: Irving Copas., MD;  Location: [facility];  Service: Gastroenterology;;  EPI   SUBMUCOSAL LIFTING INJECTION  05/29/2021   Procedure: SUBMUCOSAL LIFTING INJECTION;  Surgeon: Irving Copas., MD;  Location: [facility];  Service: Gastroenterology;;  [place] INJECTION  04/06/2021   Procedure: SUBMUCOSAL TATTOO INJECTION;  Surgeon: Thornton Park, MD;  Location: [facility];  Service: Gastroenterology;;   UPPER EXTREMITY VENOGRAPHY Bilateral 08/24/2021   Procedure: UPPER EXTREMITY VENOGRAPHY;  Surgeon: Marty Heck, MD;  Location: [facility];  Service: Cardiovascular;  Laterality: Bilateral;    Family History  Problem Relation Age of Onset   Diabetes Mother    Ovarian cancer Mother    Bipolar disorder Father    Hypertension Father     Prostate cancer Father    Prostate cancer Paternal Grandfather    Polycystic kidney disease Neg Hx     Social History   Socioeconomic History   Marital status: Single    Spouse name: Not on file   Number of children: 1   Years of education: Not on file   Highest education level: Not on file  Occupational History   Occupation: [employer]   Occupation: disabled  Tobacco Use   Smoking status: Former    Packs/day: 0.20    Years: 10.00    Total pack years: 2.00    Types: Cigarettes    Quit date: 2015    Years since quitting: 8.4   Smokeless tobacco: Never  Vaping Use   Vaping Use: Never used  Substance and Sexual Activity   Alcohol use: Not Currently    Comment: occasional wine   Drug use: No   Sexual activity: Yes  Other Topics Concern   Not on file  Social History Narrative   Not on file   Social Determinants of Health   Financial Resource Strain: High Risk (08/30/2020)   Overall Financial Resource Strain (CARDIA)    Difficulty of Paying Living Expenses: Very hard  Food Insecurity: No Food  Insecurity (08/30/2020)   Hunger Vital Sign    Worried About Running Out of Food in the Last Year: Never true    Ran Out of Food in the Last Year: Never true  Transportation Needs: No Transportation Needs (08/30/2020)   PRAPARE - Hydrologist (Medical): No    Lack of Transportation (Non-Medical): No  Physical Activity: Insufficiently Active (08/30/2020)   Exercise Vital Sign    Days of Exercise per Week: 3 days    Minutes of Exercise per Session: 30 min  Stress: Stress Concern Present (08/30/2020)   [place]    Feeling of Stress : To some extent  Social Connections: Moderately Integrated (08/30/2020)   Social Connection and Isolation Panel [NHANES]    Frequency of Communication with Friends and Family: More than three times a week    Frequency of Social Gatherings with Friends and  Family: Once a week    Attends Religious Services: 1 to 4 times per year    Active Member of [employer] or Organizations: Yes    Attends Archivist Meetings: 1 to 4 times per year    Marital Status: Never married  Intimate Partner Violence: Not At Risk (08/30/2020)   Humiliation, Afraid, Rape, and Kick questionnaire    Fear of Current or Ex-Partner: No    Emotionally Abused: No    Physically Abused: No    Sexually Abused: No    Allergies  Allergen Reactions   Ivp Dye [Iodinated Contrast Media] Itching    No current facility-administered medications for this encounter.   Current Outpatient Medications  Medication Sig Dispense Refill   oxyCODONE-acetaminophen (PERCOCET) 10-325 MG tablet Take 1 tablet by mouth every 6 (six) hours as needed for pain. 30 tablet 0   calcium acetate (PHOSLO) 667 MG capsule Take 1,334-2,001 mg by mouth See admin instructions. Take 1334 to 2001 mg with each meal (based on the size)     ibuprofen (ADVIL) 200 MG tablet Take 600 mg by mouth every 6 (six) hours as needed for moderate pain.     polyethylene glycol (MIRALAX) 17 g packet Take 17 g by mouth daily. (Patient taking differently: Take 17 g by mouth daily as needed (constipation.).) 14 each 0   PRESCRIPTION MEDICATION Inhale into the lungs at bedtime. CPAP      PHYSICAL EXAM Vitals:   09/03/21 1113 09/03/21 1315 09/03/21 1345 09/03/21 1415  BP:  133/84 121/79 109/69  Pulse:  82 81 82  Resp:  '16 17 16  '$ Temp: (!) 96.7 F (35.9 C)     TempSrc: Temporal     SpO2:  100% 99% 93%  Weight:      Height:       Constitutional: Well-appearing young man in no distress Cardiac: regular rate and rhythm.  Respiratory:  unlabored. Abdominal:  soft, non-tender, non-distended.  Peripheral vascular: Weakly palpable left radial pulse.  Palpable thrill in left arm arteriovenous graft.  Ecchymosis and erythema about the tunnel tract especially the distal arm-typical of immediate postoperative graft.  A bit of  left grip weakness which appears related to pain.  PERTINENT LABORATORY AND RADIOLOGIC DATA  Most recent CBC    Latest Ref Rng & Units 09/03/2021   12:44 PM 08/31/2021    6:05 AM 08/24/2021    9:57 AM  CBC  WBC 4.0 - 10.5 K/uL 10.6     Hemoglobin 13.0 - 17.0 g/dL 11.4  13.9  12.6  Hematocrit 39.0 - 52.0 % 36.2  41.0  37.0   Platelets 150 - 400 K/uL 197        Most recent CMP    Latest Ref Rng & Units 09/03/2021   12:44 PM 08/31/2021    6:05 AM 08/24/2021    9:57 AM  CMP  Glucose 70 - 99 mg/dL 81  98  86   BUN 6 - 20 mg/dL 57  40  44   Creatinine 0.61 - 1.24 mg/dL 13.64  10.60  10.30   Sodium 135 - 145 mmol/L 138  135  137   Potassium 3.5 - 5.1 mmol/L 6.0  4.6  5.5   Chloride 98 - 111 mmol/L 96  105  99   CO2 22 - 32 mmol/L 27     Calcium 8.9 - 10.3 mg/dL 9.4       Renal function Estimated Creatinine Clearance: 9.2 mL/min (A) (by C-G formula based on SCr of 13.64 mg/dL (H)).  Hemoglobin A1C (%)  Date Value  11/24/2019 5.6   HbA1c, POC (prediabetic range) (%)  Date Value  11/24/2019 5.6 (A)   HbA1c, POC (controlled diabetic range) (%)  Date Value  11/24/2019 5.6   HbA1c POC (<> result, manual entry) (%)  Date Value  11/24/2019 5.6    LDL Chol Calc (NIH)  Date Value Ref Range Status  12/12/2018 138 (H) 0 - 99 mg/dL Final   LDL Cholesterol  Date Value Ref Range Status  12/02/2019 95 0 - 99 mg/dL Final    Comment:           Total Cholesterol/HDL:CHD Risk Coronary Heart Disease Risk Table                     Men   Women  1/2 Average Risk   3.4   3.3  Average Risk       5.0   4.4  2 X Average Risk   9.6   7.1  3 X Average Risk  23.4   11.0        Use the calculated Patient Ratio above and the CHD Risk Table to determine the patient's CHD Risk.        ATP III CLASSIFICATION (LDL):  <100     mg/dL   Optimal  100-129  mg/dL   Near or Above                    Optimal  130-159  mg/dL   Borderline  160-189  mg/dL   High  >190     mg/dL   Very  High Performed at [facility] [address], [place], [place] [address]     Yevonne Aline. Stanford Breed, MD [facility] Phone Number: [phone] 09/03/2021 3:56 PM  Total time spent on preparing this encounter including chart review, data review, collecting history, examining the patient, coordinating care for this established patient, 40 minutes.  Portions of this report may have been transcribed using voice recognition software.  Every effort has been made to ensure accuracy; however, inadvertent computerized transcription errors may still be present.

## 2021-09-03 NOTE — Discharge Instructions (Signed)
Please follow-up with the vascular surgery team. They have ordered pain meds.

## 2021-09-03 NOTE — ED Triage Notes (Signed)
Pt brought in as [facility] trx from [facility] with infected fistula on LUE post revision on last Thursday.  [facility] reports audible thrill but no thrill on palpation.  Pt is MWF Dialysis and had it Friday via permacath in Right upper chest.   VSS

## 2021-09-03 NOTE — ED Notes (Signed)
Pt departs this ED with [facility]

## 2021-09-04 DIAGNOSIS — T782XXA Anaphylactic shock, unspecified, initial encounter: Secondary | ICD-10-CM | POA: Diagnosis not present

## 2021-09-04 DIAGNOSIS — D631 Anemia in chronic kidney disease: Secondary | ICD-10-CM | POA: Diagnosis not present

## 2021-09-04 DIAGNOSIS — Z992 Dependence on renal dialysis: Secondary | ICD-10-CM | POA: Diagnosis not present

## 2021-09-04 DIAGNOSIS — N2581 Secondary hyperparathyroidism of renal origin: Secondary | ICD-10-CM | POA: Diagnosis not present

## 2021-09-04 DIAGNOSIS — D509 Iron deficiency anemia, unspecified: Secondary | ICD-10-CM | POA: Diagnosis not present

## 2021-09-04 DIAGNOSIS — D689 Coagulation defect, unspecified: Secondary | ICD-10-CM | POA: Diagnosis not present

## 2021-09-04 DIAGNOSIS — T7840XA Allergy, unspecified, initial encounter: Secondary | ICD-10-CM | POA: Diagnosis not present

## 2021-09-04 DIAGNOSIS — N186 End stage renal disease: Secondary | ICD-10-CM | POA: Diagnosis not present

## 2021-09-04 DIAGNOSIS — R209 Unspecified disturbances of skin sensation: Secondary | ICD-10-CM | POA: Diagnosis not present

## 2021-09-04 DIAGNOSIS — R52 Pain, unspecified: Secondary | ICD-10-CM | POA: Diagnosis not present

## 2021-09-05 ENCOUNTER — Telehealth: Payer: Self-pay

## 2021-09-05 NOTE — Telephone Encounter (Signed)
Pt called c/o arm swelling and his index finger and thumb feeling numb. He went to to ED at [facility] on Sunday, 6/18 for symptoms post-op and he was sent to [facility] and was seen by Dr Stanford Breed.  Returned pt's call, two identifiers used. Pt had a L AVG redo on 6/15. Dr Stanford Breed gave stronger dose of pain med, but pt had taken 1 dose just a few min before calling, so unsure of effects. Pt states he is keeping his arm elevated when possible. Pt states his symptoms have not changed since his ED visit. F/U appt made for pt. Pt confirmed understanding.

## 2021-09-06 DIAGNOSIS — N186 End stage renal disease: Secondary | ICD-10-CM | POA: Diagnosis not present

## 2021-09-06 DIAGNOSIS — N2581 Secondary hyperparathyroidism of renal origin: Secondary | ICD-10-CM | POA: Diagnosis not present

## 2021-09-06 DIAGNOSIS — R52 Pain, unspecified: Secondary | ICD-10-CM | POA: Diagnosis not present

## 2021-09-06 DIAGNOSIS — T7840XA Allergy, unspecified, initial encounter: Secondary | ICD-10-CM | POA: Diagnosis not present

## 2021-09-06 DIAGNOSIS — R209 Unspecified disturbances of skin sensation: Secondary | ICD-10-CM | POA: Diagnosis not present

## 2021-09-06 DIAGNOSIS — R2232 Localized swelling, mass and lump, left upper limb: Secondary | ICD-10-CM

## 2021-09-06 DIAGNOSIS — T782XXA Anaphylactic shock, unspecified, initial encounter: Secondary | ICD-10-CM | POA: Diagnosis not present

## 2021-09-06 DIAGNOSIS — Z992 Dependence on renal dialysis: Secondary | ICD-10-CM | POA: Diagnosis not present

## 2021-09-06 DIAGNOSIS — D631 Anemia in chronic kidney disease: Secondary | ICD-10-CM | POA: Diagnosis not present

## 2021-09-06 DIAGNOSIS — D509 Iron deficiency anemia, unspecified: Secondary | ICD-10-CM | POA: Diagnosis not present

## 2021-09-06 DIAGNOSIS — D689 Coagulation defect, unspecified: Secondary | ICD-10-CM | POA: Diagnosis not present

## 2021-09-08 DIAGNOSIS — R209 Unspecified disturbances of skin sensation: Secondary | ICD-10-CM | POA: Diagnosis not present

## 2021-09-08 DIAGNOSIS — N2581 Secondary hyperparathyroidism of renal origin: Secondary | ICD-10-CM | POA: Diagnosis not present

## 2021-09-08 DIAGNOSIS — R52 Pain, unspecified: Secondary | ICD-10-CM | POA: Diagnosis not present

## 2021-09-08 DIAGNOSIS — T782XXA Anaphylactic shock, unspecified, initial encounter: Secondary | ICD-10-CM | POA: Diagnosis not present

## 2021-09-08 DIAGNOSIS — N186 End stage renal disease: Secondary | ICD-10-CM | POA: Diagnosis not present

## 2021-09-08 DIAGNOSIS — D631 Anemia in chronic kidney disease: Secondary | ICD-10-CM | POA: Diagnosis not present

## 2021-09-08 DIAGNOSIS — D689 Coagulation defect, unspecified: Secondary | ICD-10-CM | POA: Diagnosis not present

## 2021-09-08 DIAGNOSIS — Z992 Dependence on renal dialysis: Secondary | ICD-10-CM | POA: Diagnosis not present

## 2021-09-08 DIAGNOSIS — D509 Iron deficiency anemia, unspecified: Secondary | ICD-10-CM | POA: Diagnosis not present

## 2021-09-08 DIAGNOSIS — T7840XA Allergy, unspecified, initial encounter: Secondary | ICD-10-CM | POA: Diagnosis not present

## 2021-09-11 DIAGNOSIS — D631 Anemia in chronic kidney disease: Secondary | ICD-10-CM | POA: Diagnosis not present

## 2021-09-11 DIAGNOSIS — T782XXA Anaphylactic shock, unspecified, initial encounter: Secondary | ICD-10-CM | POA: Diagnosis not present

## 2021-09-11 DIAGNOSIS — N186 End stage renal disease: Secondary | ICD-10-CM | POA: Diagnosis not present

## 2021-09-11 DIAGNOSIS — N2581 Secondary hyperparathyroidism of renal origin: Secondary | ICD-10-CM | POA: Diagnosis not present

## 2021-09-11 DIAGNOSIS — D689 Coagulation defect, unspecified: Secondary | ICD-10-CM | POA: Diagnosis not present

## 2021-09-11 DIAGNOSIS — T7840XA Allergy, unspecified, initial encounter: Secondary | ICD-10-CM | POA: Diagnosis not present

## 2021-09-11 DIAGNOSIS — D509 Iron deficiency anemia, unspecified: Secondary | ICD-10-CM | POA: Diagnosis not present

## 2021-09-11 DIAGNOSIS — R209 Unspecified disturbances of skin sensation: Secondary | ICD-10-CM | POA: Diagnosis not present

## 2021-09-11 DIAGNOSIS — Z992 Dependence on renal dialysis: Secondary | ICD-10-CM | POA: Diagnosis not present

## 2021-09-11 DIAGNOSIS — R52 Pain, unspecified: Secondary | ICD-10-CM | POA: Diagnosis not present

## 2021-09-12 ENCOUNTER — Ambulatory Visit (HOSPITAL_BASED_OUTPATIENT_CLINIC_OR_DEPARTMENT_OTHER): Payer: Self-pay | Admitting: Nurse Practitioner

## 2021-09-13 DIAGNOSIS — N2581 Secondary hyperparathyroidism of renal origin: Secondary | ICD-10-CM | POA: Diagnosis not present

## 2021-09-13 DIAGNOSIS — R52 Pain, unspecified: Secondary | ICD-10-CM | POA: Diagnosis not present

## 2021-09-13 DIAGNOSIS — N186 End stage renal disease: Secondary | ICD-10-CM | POA: Diagnosis not present

## 2021-09-13 DIAGNOSIS — T7840XA Allergy, unspecified, initial encounter: Secondary | ICD-10-CM | POA: Diagnosis not present

## 2021-09-13 DIAGNOSIS — D689 Coagulation defect, unspecified: Secondary | ICD-10-CM | POA: Diagnosis not present

## 2021-09-13 DIAGNOSIS — D631 Anemia in chronic kidney disease: Secondary | ICD-10-CM | POA: Diagnosis not present

## 2021-09-13 DIAGNOSIS — T782XXA Anaphylactic shock, unspecified, initial encounter: Secondary | ICD-10-CM | POA: Diagnosis not present

## 2021-09-13 DIAGNOSIS — Z992 Dependence on renal dialysis: Secondary | ICD-10-CM | POA: Diagnosis not present

## 2021-09-13 DIAGNOSIS — D509 Iron deficiency anemia, unspecified: Secondary | ICD-10-CM | POA: Diagnosis not present

## 2021-09-13 DIAGNOSIS — R209 Unspecified disturbances of skin sensation: Secondary | ICD-10-CM | POA: Diagnosis not present

## 2021-09-14 ENCOUNTER — Telehealth: Payer: Self-pay | Admitting: *Deleted

## 2021-09-14 NOTE — Telephone Encounter (Signed)
Michael [name] from [facility] called stating patient had worsening "steal' symptoms and swelling around graft site.  Also stating the patient has a "raw" area at incision.  Appointment was scheduled 09/15/2021.  Called center and I notified patient.

## 2021-09-15 ENCOUNTER — Encounter: Payer: Self-pay | Admitting: Surgery

## 2021-09-15 ENCOUNTER — Ambulatory Visit (INDEPENDENT_AMBULATORY_CARE_PROVIDER_SITE_OTHER): Payer: Medicare Other | Admitting: Surgery

## 2021-09-15 VITALS — BP 144/45 | HR 89 | Temp 97.3°F | Resp 18 | Ht 74.0 in | Wt 253.1 lb

## 2021-09-15 DIAGNOSIS — N186 End stage renal disease: Secondary | ICD-10-CM | POA: Diagnosis not present

## 2021-09-15 DIAGNOSIS — Z992 Dependence on renal dialysis: Secondary | ICD-10-CM

## 2021-09-15 DIAGNOSIS — T782XXA Anaphylactic shock, unspecified, initial encounter: Secondary | ICD-10-CM | POA: Diagnosis not present

## 2021-09-15 DIAGNOSIS — N2581 Secondary hyperparathyroidism of renal origin: Secondary | ICD-10-CM | POA: Diagnosis not present

## 2021-09-15 DIAGNOSIS — D631 Anemia in chronic kidney disease: Secondary | ICD-10-CM | POA: Diagnosis not present

## 2021-09-15 DIAGNOSIS — T7840XA Allergy, unspecified, initial encounter: Secondary | ICD-10-CM | POA: Diagnosis not present

## 2021-09-15 DIAGNOSIS — D689 Coagulation defect, unspecified: Secondary | ICD-10-CM | POA: Diagnosis not present

## 2021-09-15 DIAGNOSIS — R209 Unspecified disturbances of skin sensation: Secondary | ICD-10-CM | POA: Diagnosis not present

## 2021-09-15 DIAGNOSIS — D509 Iron deficiency anemia, unspecified: Secondary | ICD-10-CM | POA: Diagnosis not present

## 2021-09-15 DIAGNOSIS — R52 Pain, unspecified: Secondary | ICD-10-CM | POA: Diagnosis not present

## 2021-09-15 MED ORDER — OXYCODONE HCL 5 MG PO TABS: 5.0000 mg | ORAL_TABLET | Freq: Three times a day (TID) | ORAL | 0 refills | Status: DC | PRN

## 2021-09-15 NOTE — Progress Notes (Signed)
Patient name: Michael [name] MRN: [id number] DOB: March 13, 1976 Sex: male  REASON FOR VISIT:     Post op  HISTORY OF PRESENT ILLNESS:   Michael [name] is a 46 y.o. male who is status post left upper arm dialysis graft by Dr. Unk Lightning on 08/31/2021.  He went to the emergency department on 618 with complaints of left hand swelling and numbness in the thumb and index finger.  He was seen by Dr. Stanford Breed who felt that he had mild steal symptoms.  He was offered graft removal for symptom relief, however the patient was reluctant because he does not want access moved to his right arm.  He is having continued problems with pain.  CURRENT MEDICATIONS:    Current Outpatient Medications  Medication Sig Dispense Refill   calcium acetate (PHOSLO) 667 MG capsule Take 1,334-2,001 mg by mouth See admin instructions. Take 1334 to 2001 mg with each meal (based on the size)     ibuprofen (ADVIL) 200 MG tablet Take 600 mg by mouth every 6 (six) hours as needed for moderate pain.     oxyCODONE-acetaminophen (PERCOCET) 10-325 MG tablet Take 1 tablet by mouth every 6 (six) hours as needed for pain. 30 tablet 0   polyethylene glycol (MIRALAX) 17 g packet Take 17 g by mouth daily. (Patient taking differently: Take 17 g by mouth daily as needed (constipation.).) 14 each 0   PRESCRIPTION MEDICATION Inhale into the lungs at bedtime. CPAP     No current facility-administered medications for this visit.    REVIEW OF SYSTEMS:   '[X]'$  denotes positive finding, '[ ]'$  denotes negative finding Cardiac  Comments:  Chest pain or chest pressure:    Shortness of breath upon exertion:    Short of breath when lying flat:    Irregular heart rhythm:    Constitutional    Fever or chills:      PHYSICAL EXAM:   Vitals:   09/15/21 1453  BP: (!) 144/45  Pulse: 89  Resp: 18  Temp: (!) 97.3 F (36.3 C)  TempSrc: Oral  SpO2: 99%  Weight: 253 lb 1.6 oz (114.8 kg)  Height: '6\' 2"'$  (1.88 m)     GENERAL: The patient is a well-nourished male, in no acute distress. The vital signs are documented above. CARDIOVASCULAR: There is a regular rate and rhythm. PULMONARY: Non-labored respirations Triphasic radial Doppler signal and palmar arch Doppler signal.  I had trouble getting a digital signal.  There is Thrill within the graft.  He does have some skin breakdown up under the axilla.   STUDIES:   none   MEDICAL ISSUES:   I discussed with the patient that his hand pain which is in the index finger and thumb is either median nerve irritation or a mild steal syndrome.  Hopefully this will improve with time.  I have encouraged him to use a exercise ball to facilitate improving blood flow to the hand.  With regards to the skin breakdown in his axilla.  I have encouraged him to keep this clean with soap and water and to keep a dry gauze up under there.  This should heal on its own.  He is can return in 1 to 2 weeks for follow-up.  I did give him 20 oxycodone tablets as he is having difficulty sleeping because of the pain in his upper arm around the incision and in the open areas in his axilla.  Leia Alf, MD, FACS [facility] [phone]  [phone] Pager [phone]

## 2021-09-18 DIAGNOSIS — D509 Iron deficiency anemia, unspecified: Secondary | ICD-10-CM | POA: Diagnosis not present

## 2021-09-18 DIAGNOSIS — R52 Pain, unspecified: Secondary | ICD-10-CM | POA: Diagnosis not present

## 2021-09-18 DIAGNOSIS — R209 Unspecified disturbances of skin sensation: Secondary | ICD-10-CM | POA: Diagnosis not present

## 2021-09-18 DIAGNOSIS — N186 End stage renal disease: Secondary | ICD-10-CM | POA: Diagnosis not present

## 2021-09-18 DIAGNOSIS — T7840XA Allergy, unspecified, initial encounter: Secondary | ICD-10-CM | POA: Diagnosis not present

## 2021-09-18 DIAGNOSIS — D689 Coagulation defect, unspecified: Secondary | ICD-10-CM | POA: Diagnosis not present

## 2021-09-18 DIAGNOSIS — T782XXA Anaphylactic shock, unspecified, initial encounter: Secondary | ICD-10-CM | POA: Diagnosis not present

## 2021-09-18 DIAGNOSIS — D631 Anemia in chronic kidney disease: Secondary | ICD-10-CM | POA: Diagnosis not present

## 2021-09-18 DIAGNOSIS — N2581 Secondary hyperparathyroidism of renal origin: Secondary | ICD-10-CM | POA: Diagnosis not present

## 2021-09-18 DIAGNOSIS — Z992 Dependence on renal dialysis: Secondary | ICD-10-CM | POA: Diagnosis not present

## 2021-09-20 DIAGNOSIS — N186 End stage renal disease: Secondary | ICD-10-CM | POA: Diagnosis not present

## 2021-09-20 DIAGNOSIS — N2581 Secondary hyperparathyroidism of renal origin: Secondary | ICD-10-CM | POA: Diagnosis not present

## 2021-09-20 DIAGNOSIS — R52 Pain, unspecified: Secondary | ICD-10-CM | POA: Diagnosis not present

## 2021-09-20 DIAGNOSIS — T782XXA Anaphylactic shock, unspecified, initial encounter: Secondary | ICD-10-CM | POA: Diagnosis not present

## 2021-09-20 DIAGNOSIS — D631 Anemia in chronic kidney disease: Secondary | ICD-10-CM | POA: Diagnosis not present

## 2021-09-20 DIAGNOSIS — T7840XA Allergy, unspecified, initial encounter: Secondary | ICD-10-CM | POA: Diagnosis not present

## 2021-09-20 DIAGNOSIS — D689 Coagulation defect, unspecified: Secondary | ICD-10-CM | POA: Diagnosis not present

## 2021-09-20 DIAGNOSIS — R209 Unspecified disturbances of skin sensation: Secondary | ICD-10-CM | POA: Diagnosis not present

## 2021-09-20 DIAGNOSIS — Z992 Dependence on renal dialysis: Secondary | ICD-10-CM | POA: Diagnosis not present

## 2021-09-20 DIAGNOSIS — D509 Iron deficiency anemia, unspecified: Secondary | ICD-10-CM | POA: Diagnosis not present

## 2021-09-21 NOTE — Telephone Encounter (Signed)
Appt completed

## 2021-09-22 ENCOUNTER — Encounter: Payer: Medicare Other | Admitting: Vascular Surgery

## 2021-09-22 DIAGNOSIS — N2581 Secondary hyperparathyroidism of renal origin: Secondary | ICD-10-CM | POA: Diagnosis not present

## 2021-09-22 DIAGNOSIS — Z992 Dependence on renal dialysis: Secondary | ICD-10-CM | POA: Diagnosis not present

## 2021-09-22 DIAGNOSIS — D631 Anemia in chronic kidney disease: Secondary | ICD-10-CM | POA: Diagnosis not present

## 2021-09-22 DIAGNOSIS — D689 Coagulation defect, unspecified: Secondary | ICD-10-CM | POA: Diagnosis not present

## 2021-09-22 DIAGNOSIS — T7840XA Allergy, unspecified, initial encounter: Secondary | ICD-10-CM | POA: Diagnosis not present

## 2021-09-22 DIAGNOSIS — R209 Unspecified disturbances of skin sensation: Secondary | ICD-10-CM | POA: Diagnosis not present

## 2021-09-22 DIAGNOSIS — N186 End stage renal disease: Secondary | ICD-10-CM | POA: Diagnosis not present

## 2021-09-22 DIAGNOSIS — R52 Pain, unspecified: Secondary | ICD-10-CM | POA: Diagnosis not present

## 2021-09-22 DIAGNOSIS — T782XXA Anaphylactic shock, unspecified, initial encounter: Secondary | ICD-10-CM | POA: Diagnosis not present

## 2021-09-22 DIAGNOSIS — D509 Iron deficiency anemia, unspecified: Secondary | ICD-10-CM | POA: Diagnosis not present

## 2021-09-25 ENCOUNTER — Telehealth: Payer: Self-pay

## 2021-09-25 DIAGNOSIS — D689 Coagulation defect, unspecified: Secondary | ICD-10-CM | POA: Diagnosis not present

## 2021-09-25 DIAGNOSIS — N2581 Secondary hyperparathyroidism of renal origin: Secondary | ICD-10-CM | POA: Diagnosis not present

## 2021-09-25 DIAGNOSIS — T782XXA Anaphylactic shock, unspecified, initial encounter: Secondary | ICD-10-CM | POA: Diagnosis not present

## 2021-09-25 DIAGNOSIS — Z992 Dependence on renal dialysis: Secondary | ICD-10-CM | POA: Diagnosis not present

## 2021-09-25 DIAGNOSIS — D631 Anemia in chronic kidney disease: Secondary | ICD-10-CM | POA: Diagnosis not present

## 2021-09-25 DIAGNOSIS — N186 End stage renal disease: Secondary | ICD-10-CM | POA: Diagnosis not present

## 2021-09-25 DIAGNOSIS — R209 Unspecified disturbances of skin sensation: Secondary | ICD-10-CM | POA: Diagnosis not present

## 2021-09-25 DIAGNOSIS — R52 Pain, unspecified: Secondary | ICD-10-CM | POA: Diagnosis not present

## 2021-09-25 DIAGNOSIS — D509 Iron deficiency anemia, unspecified: Secondary | ICD-10-CM | POA: Diagnosis not present

## 2021-09-25 DIAGNOSIS — T7840XA Allergy, unspecified, initial encounter: Secondary | ICD-10-CM | POA: Diagnosis not present

## 2021-09-25 NOTE — Telephone Encounter (Signed)
Pt called stating that he was still having a lot of pain in his arm, especially when it's down by his side and where the wounds are located. He is asking for pain med refill.  Reviewed pt's chart, spoke with PA, returned pt's call, two identifiers used. Informed pt that a provider would be given the info regarding a pain med refill request. Will call back with response. Confirmed understanding.  Dr Trula Slade ok'd refill, [place] PA e-scribed, called pt to inform him. Confirmed understanding.

## 2021-09-26 ENCOUNTER — Other Ambulatory Visit: Payer: Self-pay | Admitting: Physician Assistant

## 2021-09-26 DIAGNOSIS — Z992 Dependence on renal dialysis: Secondary | ICD-10-CM

## 2021-09-26 DIAGNOSIS — N186 End stage renal disease: Secondary | ICD-10-CM

## 2021-09-26 MED ORDER — OXYCODONE HCL 5 MG PO TABS: 5.0000 mg | ORAL_TABLET | Freq: Three times a day (TID) | ORAL | 0 refills | Status: DC | PRN

## 2021-09-27 DIAGNOSIS — N2581 Secondary hyperparathyroidism of renal origin: Secondary | ICD-10-CM | POA: Diagnosis not present

## 2021-09-27 DIAGNOSIS — D631 Anemia in chronic kidney disease: Secondary | ICD-10-CM | POA: Diagnosis not present

## 2021-09-27 DIAGNOSIS — N186 End stage renal disease: Secondary | ICD-10-CM | POA: Diagnosis not present

## 2021-09-27 DIAGNOSIS — R209 Unspecified disturbances of skin sensation: Secondary | ICD-10-CM | POA: Diagnosis not present

## 2021-09-27 DIAGNOSIS — T782XXA Anaphylactic shock, unspecified, initial encounter: Secondary | ICD-10-CM | POA: Diagnosis not present

## 2021-09-27 DIAGNOSIS — Z992 Dependence on renal dialysis: Secondary | ICD-10-CM | POA: Diagnosis not present

## 2021-09-27 DIAGNOSIS — D509 Iron deficiency anemia, unspecified: Secondary | ICD-10-CM | POA: Diagnosis not present

## 2021-09-27 DIAGNOSIS — T7840XA Allergy, unspecified, initial encounter: Secondary | ICD-10-CM | POA: Diagnosis not present

## 2021-09-27 DIAGNOSIS — D689 Coagulation defect, unspecified: Secondary | ICD-10-CM | POA: Diagnosis not present

## 2021-09-27 DIAGNOSIS — R52 Pain, unspecified: Secondary | ICD-10-CM | POA: Diagnosis not present

## 2021-09-28 NOTE — Progress Notes (Signed)
Patient name: Michael [name] MRN: [id number] DOB: Aug 17, 1975 Sex: male  REASON FOR VISIT:    Post op  HISTORY OF PRESENT ILLNESS:   Michael [name] is a 46 y.o. male who is status post left upper arm dialysis graft on 08/31/2021.   He was most recently seen by my partner in the office with mild steal symptoms versus some median nerve irritation. Wounds were healing slowly, with some breakdown in the axilla.  On exam today, Michael [name] was doing well.  He noted improvement in left thumb and pointer finger paresthesias.  Only the tip of the pointer finger is now involved, the majority of the thumb continues to wax and wane.  His left axillary wound is also improved and is superficial.  Major complaint is heaviness in the left upper extremity and some pain which is appreciated most after dialysis.   CURRENT MEDICATIONS:    Current Outpatient Medications  Medication Sig Dispense Refill   calcium acetate (PHOSLO) 667 MG capsule Take 1,334-2,001 mg by mouth See admin instructions. Take 1334 to 2001 mg with each meal (based on the size)     ibuprofen (ADVIL) 200 MG tablet Take 600 mg by mouth every 6 (six) hours as needed for moderate pain.     oxyCODONE (OXY IR/ROXICODONE) 5 MG immediate release tablet Take 1 tablet (5 mg total) by mouth every 8 (eight) hours as needed for severe pain. 20 tablet 0   oxyCODONE (ROXICODONE) 5 MG immediate release tablet Take 1 tablet (5 mg total) by mouth every 8 (eight) hours as needed. 10 tablet 0   oxyCODONE-acetaminophen (PERCOCET) 10-325 MG tablet Take 1 tablet by mouth every 6 (six) hours as needed for pain. 30 tablet 0   polyethylene glycol (MIRALAX) 17 g packet Take 17 g by mouth daily. (Patient taking differently: Take 17 g by mouth daily as needed (constipation.).) 14 each 0   PRESCRIPTION MEDICATION Inhale into the lungs at bedtime. CPAP     No current facility-administered medications for this visit.    REVIEW OF SYSTEMS:    '[X]'$  denotes positive finding, '[ ]'$  denotes negative finding Cardiac  Comments:  Chest pain or chest pressure:    Shortness of breath upon exertion:    Short of breath when lying flat:    Irregular heart rhythm:    Constitutional    Fever or chills:      PHYSICAL EXAM:   There were no vitals filed for this visit.   GENERAL: The patient is a well-nourished male, in no acute distress. The vital signs are documented above. CARDIOVASCULAR: There is a regular rate and rhythm. PULMONARY: Non-labored respirations Palpable radial pulse.  There is a thrill within the graft.  The axilla has improved, the wound appears superficial involving only the dermis.  STUDIES:   none  MEDICAL ISSUES:   Patient is a 46 year old male status post redo left AV graft.  Numbness and paresthesias in the pointer and thumb are improving slowly.  He does note some heaviness in the hand.  No wounds on the hand.  Wound in the axilla improved, overall dermal surface appreciated.  A long conversation with Michael [name] regarding the above.  I am happy his paresthesias are improving.  I used silver nitrate on the axillary wound.  We also discussed that the heaviness feeling in the arm is likely due to competitive venous flow from the fistula and native venous system.  I would like to see him back in 2 weeks for wound check, at  that time we will also do a duplex ultrasound of his AV graft.  We discussed using Tylenol.  No narcotic pain medication administered.   Michael John MD [facility] [phone] Pager [phone]

## 2021-09-29 ENCOUNTER — Encounter: Payer: Self-pay | Admitting: Vascular Surgery

## 2021-09-29 ENCOUNTER — Ambulatory Visit (INDEPENDENT_AMBULATORY_CARE_PROVIDER_SITE_OTHER): Payer: Medicare Other | Admitting: Vascular Surgery

## 2021-09-29 VITALS — BP 136/82 | HR 78 | Temp 98.2°F | Resp 20 | Ht 74.0 in | Wt 254.0 lb

## 2021-09-29 DIAGNOSIS — T782XXA Anaphylactic shock, unspecified, initial encounter: Secondary | ICD-10-CM | POA: Diagnosis not present

## 2021-09-29 DIAGNOSIS — R209 Unspecified disturbances of skin sensation: Secondary | ICD-10-CM | POA: Diagnosis not present

## 2021-09-29 DIAGNOSIS — D631 Anemia in chronic kidney disease: Secondary | ICD-10-CM | POA: Diagnosis not present

## 2021-09-29 DIAGNOSIS — R52 Pain, unspecified: Secondary | ICD-10-CM | POA: Diagnosis not present

## 2021-09-29 DIAGNOSIS — N186 End stage renal disease: Secondary | ICD-10-CM

## 2021-09-29 DIAGNOSIS — T7840XA Allergy, unspecified, initial encounter: Secondary | ICD-10-CM | POA: Diagnosis not present

## 2021-09-29 DIAGNOSIS — Z992 Dependence on renal dialysis: Secondary | ICD-10-CM

## 2021-09-29 DIAGNOSIS — N2581 Secondary hyperparathyroidism of renal origin: Secondary | ICD-10-CM | POA: Diagnosis not present

## 2021-09-29 DIAGNOSIS — D689 Coagulation defect, unspecified: Secondary | ICD-10-CM | POA: Diagnosis not present

## 2021-09-29 DIAGNOSIS — D509 Iron deficiency anemia, unspecified: Secondary | ICD-10-CM | POA: Diagnosis not present

## 2021-10-02 DIAGNOSIS — D631 Anemia in chronic kidney disease: Secondary | ICD-10-CM | POA: Diagnosis not present

## 2021-10-02 DIAGNOSIS — R52 Pain, unspecified: Secondary | ICD-10-CM | POA: Diagnosis not present

## 2021-10-02 DIAGNOSIS — N186 End stage renal disease: Secondary | ICD-10-CM | POA: Diagnosis not present

## 2021-10-02 DIAGNOSIS — R209 Unspecified disturbances of skin sensation: Secondary | ICD-10-CM | POA: Diagnosis not present

## 2021-10-02 DIAGNOSIS — N2581 Secondary hyperparathyroidism of renal origin: Secondary | ICD-10-CM | POA: Diagnosis not present

## 2021-10-02 DIAGNOSIS — T782XXA Anaphylactic shock, unspecified, initial encounter: Secondary | ICD-10-CM | POA: Diagnosis not present

## 2021-10-02 DIAGNOSIS — T7840XA Allergy, unspecified, initial encounter: Secondary | ICD-10-CM | POA: Diagnosis not present

## 2021-10-02 DIAGNOSIS — Z992 Dependence on renal dialysis: Secondary | ICD-10-CM | POA: Diagnosis not present

## 2021-10-02 DIAGNOSIS — D509 Iron deficiency anemia, unspecified: Secondary | ICD-10-CM | POA: Diagnosis not present

## 2021-10-02 DIAGNOSIS — D689 Coagulation defect, unspecified: Secondary | ICD-10-CM | POA: Diagnosis not present

## 2021-10-04 ENCOUNTER — Other Ambulatory Visit: Payer: Self-pay | Admitting: *Deleted

## 2021-10-04 DIAGNOSIS — Z992 Dependence on renal dialysis: Secondary | ICD-10-CM | POA: Diagnosis not present

## 2021-10-04 DIAGNOSIS — T782XXA Anaphylactic shock, unspecified, initial encounter: Secondary | ICD-10-CM | POA: Diagnosis not present

## 2021-10-04 DIAGNOSIS — D509 Iron deficiency anemia, unspecified: Secondary | ICD-10-CM | POA: Diagnosis not present

## 2021-10-04 DIAGNOSIS — D689 Coagulation defect, unspecified: Secondary | ICD-10-CM | POA: Diagnosis not present

## 2021-10-04 DIAGNOSIS — R209 Unspecified disturbances of skin sensation: Secondary | ICD-10-CM | POA: Diagnosis not present

## 2021-10-04 DIAGNOSIS — D631 Anemia in chronic kidney disease: Secondary | ICD-10-CM | POA: Diagnosis not present

## 2021-10-04 DIAGNOSIS — N2581 Secondary hyperparathyroidism of renal origin: Secondary | ICD-10-CM | POA: Diagnosis not present

## 2021-10-04 DIAGNOSIS — T7840XA Allergy, unspecified, initial encounter: Secondary | ICD-10-CM | POA: Diagnosis not present

## 2021-10-04 DIAGNOSIS — N186 End stage renal disease: Secondary | ICD-10-CM | POA: Diagnosis not present

## 2021-10-04 DIAGNOSIS — R52 Pain, unspecified: Secondary | ICD-10-CM | POA: Diagnosis not present

## 2021-10-04 NOTE — Patient Outreach (Signed)
[facility]) Care Management  10/04/2021  Isador Castille [birth year] [id number]  Successful telephone outreach call to patient for screening. HIPAA identifiers obtained. [facility] services reviewed and discussed with patient. Patient verbally agrees to participate in the [facility] program. Nurse explained that a Nurse CM will outreach to the patient within the next several weeks. Patient verbalized understanding.   Emelia Loron RN, BSN [place] [phone] [email or web address]

## 2021-10-06 ENCOUNTER — Other Ambulatory Visit: Payer: Self-pay

## 2021-10-06 DIAGNOSIS — N2581 Secondary hyperparathyroidism of renal origin: Secondary | ICD-10-CM | POA: Diagnosis not present

## 2021-10-06 DIAGNOSIS — D509 Iron deficiency anemia, unspecified: Secondary | ICD-10-CM | POA: Diagnosis not present

## 2021-10-06 DIAGNOSIS — D631 Anemia in chronic kidney disease: Secondary | ICD-10-CM | POA: Diagnosis not present

## 2021-10-06 DIAGNOSIS — D689 Coagulation defect, unspecified: Secondary | ICD-10-CM | POA: Diagnosis not present

## 2021-10-06 DIAGNOSIS — T7840XA Allergy, unspecified, initial encounter: Secondary | ICD-10-CM | POA: Diagnosis not present

## 2021-10-06 DIAGNOSIS — N186 End stage renal disease: Secondary | ICD-10-CM

## 2021-10-06 DIAGNOSIS — Z992 Dependence on renal dialysis: Secondary | ICD-10-CM | POA: Diagnosis not present

## 2021-10-06 DIAGNOSIS — R52 Pain, unspecified: Secondary | ICD-10-CM | POA: Diagnosis not present

## 2021-10-06 DIAGNOSIS — T782XXA Anaphylactic shock, unspecified, initial encounter: Secondary | ICD-10-CM | POA: Diagnosis not present

## 2021-10-06 DIAGNOSIS — R209 Unspecified disturbances of skin sensation: Secondary | ICD-10-CM | POA: Diagnosis not present

## 2021-10-09 DIAGNOSIS — N186 End stage renal disease: Secondary | ICD-10-CM | POA: Diagnosis not present

## 2021-10-09 DIAGNOSIS — R209 Unspecified disturbances of skin sensation: Secondary | ICD-10-CM | POA: Diagnosis not present

## 2021-10-09 DIAGNOSIS — D509 Iron deficiency anemia, unspecified: Secondary | ICD-10-CM | POA: Diagnosis not present

## 2021-10-09 DIAGNOSIS — D631 Anemia in chronic kidney disease: Secondary | ICD-10-CM | POA: Diagnosis not present

## 2021-10-09 DIAGNOSIS — Z992 Dependence on renal dialysis: Secondary | ICD-10-CM | POA: Diagnosis not present

## 2021-10-09 DIAGNOSIS — T7840XA Allergy, unspecified, initial encounter: Secondary | ICD-10-CM | POA: Diagnosis not present

## 2021-10-09 DIAGNOSIS — T782XXA Anaphylactic shock, unspecified, initial encounter: Secondary | ICD-10-CM | POA: Diagnosis not present

## 2021-10-09 DIAGNOSIS — R52 Pain, unspecified: Secondary | ICD-10-CM | POA: Diagnosis not present

## 2021-10-09 DIAGNOSIS — N2581 Secondary hyperparathyroidism of renal origin: Secondary | ICD-10-CM | POA: Diagnosis not present

## 2021-10-09 DIAGNOSIS — D689 Coagulation defect, unspecified: Secondary | ICD-10-CM | POA: Diagnosis not present

## 2021-10-09 NOTE — Progress Notes (Signed)
Postoperative Access Visit   History of Present Illness   Michael [name] is a 46 y.o. year old male who presents for postoperative follow-up for: Left brachial artery to axillary vein arteriovenous graft 08/31/21 by Dr. Virl Cagey. The patient's wounds are healing well. The left axillary incision is still healing. Says it is very uncomfortable at times. The patient notes mild steal symptoms. He has some numbness of thumb and index finger, this is slightly better. The left forearm numbness has resolved  The patient currently dialyzes via right IJ TDC at [facility] location on MWF  Physical Examination   Vitals:   10/13/21 0812  BP: 137/85  Pulse: 99  Resp: 18  Temp: 97.9 F (36.6 C)  TempSrc: Temporal  SpO2: 96%  Weight: 252 lb (114.3 kg)  Height: '6\' 2"'$  (1.88 m)   Body mass index is 32.35 kg/m.  left arm Incisions are healing. Mild separation of left axillary incision. No drainage on compression. 2+ radial pulse, hand grip is 5/5, sensation in digits is intact, palpable thrill, bruit can be auscultated    Non Invasive Vascular lab: Findings:  +--------------------+----------+-----------------+--------+  AVG                 PSV (cm/s)Flow Vol (mL/min)Describe  +--------------------+----------+-----------------+--------+  Native artery inflow   294          3010                 +--------------------+----------+-----------------+--------+  Arterial anastomosis   447                               +--------------------+----------+-----------------+--------+  Prox graft             312                               +--------------------+----------+-----------------+--------+  Mid graft              307                               +--------------------+----------+-----------------+--------+  Distal graft           354                               +--------------------+----------+-----------------+--------+  Venous anastomosis     255                                +--------------------+----------+-----------------+--------+  Venous outflow         239                               +--------------------+----------+-----------------+--------+     Summary:  Patent left arteriovenous graft.   Medical Decision Making   Michael [name] is a 46 y.o. year old male who presents s/p Left brachial artery to axillary vein arteriovenous graft 08/31/21 by Dr. Virl Cagey. Duplex today shows patent left AV graft with good volume flow and no evidence of stenosis. Incisions are healing well. Advised to place dry gauze in left axilla to help wick moisture. Continue to clean with mild soap and water. Patient has mild numbness in left  thumb and index finger. Advised him that this may improve with time The patient's access will be ready for use immediately The patient's tunneled dialysis catheter can be removed when Nephrology is comfortable with the performance of the left AV graft The patient may follow up on a prn basis   Karoline Caldwell, PA-C [facility]: [phone]  Clinic MD: Virl Cagey

## 2021-10-11 ENCOUNTER — Ambulatory Visit (HOSPITAL_COMMUNITY)
Admission: RE | Admit: 2021-10-11 | Discharge: 2021-10-11 | Disposition: A | Discharge status: Home / Self Care | Payer: Medicare Other | Source: Ambulatory Visit | Attending: Vascular Surgery | Admitting: Vascular Surgery

## 2021-10-11 DIAGNOSIS — Z992 Dependence on renal dialysis: Secondary | ICD-10-CM | POA: Diagnosis not present

## 2021-10-11 DIAGNOSIS — D509 Iron deficiency anemia, unspecified: Secondary | ICD-10-CM | POA: Diagnosis not present

## 2021-10-11 DIAGNOSIS — N186 End stage renal disease: Secondary | ICD-10-CM

## 2021-10-11 DIAGNOSIS — R52 Pain, unspecified: Secondary | ICD-10-CM | POA: Diagnosis not present

## 2021-10-11 DIAGNOSIS — T7840XA Allergy, unspecified, initial encounter: Secondary | ICD-10-CM | POA: Diagnosis not present

## 2021-10-11 DIAGNOSIS — T782XXA Anaphylactic shock, unspecified, initial encounter: Secondary | ICD-10-CM | POA: Diagnosis not present

## 2021-10-11 DIAGNOSIS — D689 Coagulation defect, unspecified: Secondary | ICD-10-CM | POA: Diagnosis not present

## 2021-10-11 DIAGNOSIS — N2581 Secondary hyperparathyroidism of renal origin: Secondary | ICD-10-CM | POA: Diagnosis not present

## 2021-10-11 DIAGNOSIS — D631 Anemia in chronic kidney disease: Secondary | ICD-10-CM | POA: Diagnosis not present

## 2021-10-11 DIAGNOSIS — R209 Unspecified disturbances of skin sensation: Secondary | ICD-10-CM | POA: Diagnosis not present

## 2021-10-12 ENCOUNTER — Encounter (HOSPITAL_BASED_OUTPATIENT_CLINIC_OR_DEPARTMENT_OTHER): Payer: Self-pay | Admitting: Nurse Practitioner

## 2021-10-13 ENCOUNTER — Encounter: Payer: Self-pay | Admitting: Physician Assistant

## 2021-10-13 ENCOUNTER — Ambulatory Visit (INDEPENDENT_AMBULATORY_CARE_PROVIDER_SITE_OTHER): Payer: Medicare Other | Admitting: Physician Assistant

## 2021-10-13 VITALS — BP 137/85 | HR 99 | Temp 97.9°F | Resp 18 | Ht 74.0 in | Wt 252.0 lb

## 2021-10-13 DIAGNOSIS — D689 Coagulation defect, unspecified: Secondary | ICD-10-CM | POA: Diagnosis not present

## 2021-10-13 DIAGNOSIS — D509 Iron deficiency anemia, unspecified: Secondary | ICD-10-CM | POA: Diagnosis not present

## 2021-10-13 DIAGNOSIS — R209 Unspecified disturbances of skin sensation: Secondary | ICD-10-CM | POA: Diagnosis not present

## 2021-10-13 DIAGNOSIS — T7840XA Allergy, unspecified, initial encounter: Secondary | ICD-10-CM | POA: Diagnosis not present

## 2021-10-13 DIAGNOSIS — T782XXA Anaphylactic shock, unspecified, initial encounter: Secondary | ICD-10-CM | POA: Diagnosis not present

## 2021-10-13 DIAGNOSIS — N2581 Secondary hyperparathyroidism of renal origin: Secondary | ICD-10-CM | POA: Diagnosis not present

## 2021-10-13 DIAGNOSIS — N186 End stage renal disease: Secondary | ICD-10-CM

## 2021-10-13 DIAGNOSIS — D631 Anemia in chronic kidney disease: Secondary | ICD-10-CM | POA: Diagnosis not present

## 2021-10-13 DIAGNOSIS — Z992 Dependence on renal dialysis: Secondary | ICD-10-CM | POA: Diagnosis not present

## 2021-10-13 DIAGNOSIS — R52 Pain, unspecified: Secondary | ICD-10-CM | POA: Diagnosis not present

## 2021-10-16 DIAGNOSIS — R209 Unspecified disturbances of skin sensation: Secondary | ICD-10-CM | POA: Diagnosis not present

## 2021-10-16 DIAGNOSIS — N186 End stage renal disease: Secondary | ICD-10-CM | POA: Diagnosis not present

## 2021-10-16 DIAGNOSIS — T7840XA Allergy, unspecified, initial encounter: Secondary | ICD-10-CM | POA: Diagnosis not present

## 2021-10-16 DIAGNOSIS — D689 Coagulation defect, unspecified: Secondary | ICD-10-CM | POA: Diagnosis not present

## 2021-10-16 DIAGNOSIS — D631 Anemia in chronic kidney disease: Secondary | ICD-10-CM | POA: Diagnosis not present

## 2021-10-16 DIAGNOSIS — D509 Iron deficiency anemia, unspecified: Secondary | ICD-10-CM | POA: Diagnosis not present

## 2021-10-16 DIAGNOSIS — T782XXA Anaphylactic shock, unspecified, initial encounter: Secondary | ICD-10-CM | POA: Diagnosis not present

## 2021-10-16 DIAGNOSIS — R52 Pain, unspecified: Secondary | ICD-10-CM | POA: Diagnosis not present

## 2021-10-16 DIAGNOSIS — Z992 Dependence on renal dialysis: Secondary | ICD-10-CM | POA: Diagnosis not present

## 2021-10-16 DIAGNOSIS — N2581 Secondary hyperparathyroidism of renal origin: Secondary | ICD-10-CM | POA: Diagnosis not present

## 2021-10-17 ENCOUNTER — Telehealth: Payer: Self-pay

## 2021-10-17 NOTE — Patient Outreach (Signed)
  Care Coordination   Follow Up Visit Note   10/17/2021 Name: Michael [name] MRN: [id number] DOB: 1975/12/02  Michael [name] is a 46 y.o. year old male who sees Michael Dew, FNP for primary care. I spoke with  Michael [name] by phone today  RNCM called to follow up on Care Management/Care coordination services. Patient states this is not a good time and request RNCM call at another date/time.  What matters to the patients health and wellness today?  n/a   Goals Addressed   None     SDOH assessments and interventions completed:   No  Care Coordination Interventions Activated:  No Care Coordination Interventions:  No, not indicated  Follow up plan: Follow up call scheduled for 10/18/21  Encounter Outcome:  Pt. Request to Call Back  Thea Silversmith, RN, MSN, BSN, CCM Care Coordinator [phone]

## 2021-10-18 ENCOUNTER — Ambulatory Visit: Payer: Self-pay

## 2021-10-18 DIAGNOSIS — T7840XA Allergy, unspecified, initial encounter: Secondary | ICD-10-CM | POA: Diagnosis not present

## 2021-10-18 DIAGNOSIS — R52 Pain, unspecified: Secondary | ICD-10-CM | POA: Diagnosis not present

## 2021-10-18 DIAGNOSIS — R110 Nausea: Secondary | ICD-10-CM | POA: Diagnosis not present

## 2021-10-18 DIAGNOSIS — D689 Coagulation defect, unspecified: Secondary | ICD-10-CM | POA: Diagnosis not present

## 2021-10-18 DIAGNOSIS — D631 Anemia in chronic kidney disease: Secondary | ICD-10-CM | POA: Diagnosis not present

## 2021-10-18 DIAGNOSIS — N2581 Secondary hyperparathyroidism of renal origin: Secondary | ICD-10-CM | POA: Diagnosis not present

## 2021-10-18 DIAGNOSIS — N186 End stage renal disease: Secondary | ICD-10-CM | POA: Diagnosis not present

## 2021-10-18 DIAGNOSIS — R209 Unspecified disturbances of skin sensation: Secondary | ICD-10-CM | POA: Diagnosis not present

## 2021-10-18 DIAGNOSIS — Z992 Dependence on renal dialysis: Secondary | ICD-10-CM | POA: Diagnosis not present

## 2021-10-18 NOTE — Patient Outreach (Signed)
  Care Coordination   10/18/2021 Name: Michael [name] MRN: [id number] DOB: 08-Aug-1975   Care Coordination Outreach Attempts:  A second unsuccessful outreach was attempted today to offer the patient with information about available care coordination services as a benefit of their health plan.     Follow Up Plan:  Additional outreach attempts will be made to offer the patient care coordination information and services.   Encounter Outcome:  Pt. Request to Call Back  Care Coordination Interventions Activated:  No   Care Coordination Interventions:  No, not indicated    Thea Silversmith, RN, MSN, BSN, CCM Care Coordinator [phone]

## 2021-10-19 ENCOUNTER — Ambulatory Visit: Payer: Self-pay

## 2021-10-19 NOTE — Patient Outreach (Signed)
  Care Coordination   Initial Visit Note   10/19/2021 Name: Michael [name] MRN: [id number] DOB: December 12, 1975  Michael [name] is a 46 y.o. year old male who states he does not have a primary care provider at this time and needs to find another provider. I spoke with  Michael [name] by phone today.  What matters to the patients health and wellness today?  Needs to find a PCP    Goals Addressed             This Visit's Progress    Find a PCP       Care Coordination Interventions: Discussed the importance of seeing a Primary Care Provider Advised patient to review list provided by insurance provider to obtain PCP Provider contact number for physician referral [phone]         SDOH assessments and interventions completed:  Yes     Care Coordination Interventions Activated:  Yes  Care Coordination Interventions:  Yes, provided   Follow up plan: Follow up call scheduled for 12/06/21    Encounter Outcome:  Pt. Visit Completed   Thea Silversmith, RN, MSN, BSN, [place] Coordinator [phone]

## 2021-10-20 DIAGNOSIS — R110 Nausea: Secondary | ICD-10-CM | POA: Diagnosis not present

## 2021-10-20 DIAGNOSIS — D631 Anemia in chronic kidney disease: Secondary | ICD-10-CM | POA: Diagnosis not present

## 2021-10-20 DIAGNOSIS — N2581 Secondary hyperparathyroidism of renal origin: Secondary | ICD-10-CM | POA: Diagnosis not present

## 2021-10-20 DIAGNOSIS — R209 Unspecified disturbances of skin sensation: Secondary | ICD-10-CM | POA: Diagnosis not present

## 2021-10-20 DIAGNOSIS — Z992 Dependence on renal dialysis: Secondary | ICD-10-CM | POA: Diagnosis not present

## 2021-10-20 DIAGNOSIS — D689 Coagulation defect, unspecified: Secondary | ICD-10-CM | POA: Diagnosis not present

## 2021-10-20 DIAGNOSIS — R52 Pain, unspecified: Secondary | ICD-10-CM | POA: Diagnosis not present

## 2021-10-20 DIAGNOSIS — T7840XA Allergy, unspecified, initial encounter: Secondary | ICD-10-CM | POA: Diagnosis not present

## 2021-10-20 DIAGNOSIS — N186 End stage renal disease: Secondary | ICD-10-CM | POA: Diagnosis not present

## 2021-10-23 DIAGNOSIS — N2581 Secondary hyperparathyroidism of renal origin: Secondary | ICD-10-CM | POA: Diagnosis not present

## 2021-10-23 DIAGNOSIS — R209 Unspecified disturbances of skin sensation: Secondary | ICD-10-CM | POA: Diagnosis not present

## 2021-10-23 DIAGNOSIS — R52 Pain, unspecified: Secondary | ICD-10-CM | POA: Diagnosis not present

## 2021-10-23 DIAGNOSIS — R110 Nausea: Secondary | ICD-10-CM | POA: Diagnosis not present

## 2021-10-23 DIAGNOSIS — N186 End stage renal disease: Secondary | ICD-10-CM | POA: Diagnosis not present

## 2021-10-23 DIAGNOSIS — D631 Anemia in chronic kidney disease: Secondary | ICD-10-CM | POA: Diagnosis not present

## 2021-10-23 DIAGNOSIS — D689 Coagulation defect, unspecified: Secondary | ICD-10-CM | POA: Diagnosis not present

## 2021-10-23 DIAGNOSIS — T7840XA Allergy, unspecified, initial encounter: Secondary | ICD-10-CM | POA: Diagnosis not present

## 2021-10-23 DIAGNOSIS — Z992 Dependence on renal dialysis: Secondary | ICD-10-CM | POA: Diagnosis not present

## 2021-10-24 ENCOUNTER — Other Ambulatory Visit: Payer: Self-pay

## 2021-10-24 ENCOUNTER — Telehealth: Payer: Self-pay

## 2021-10-24 NOTE — Telephone Encounter (Signed)
Faxed referral from Dr. Corliss Parish requesting CVC removal after 10/25/2021, AVG in use.   Spoke with patient and scheduled procedure or 10/31/21 arriving to [facility] admitting at 1145 AM. Patient verbalized understanding.

## 2021-10-25 DIAGNOSIS — D689 Coagulation defect, unspecified: Secondary | ICD-10-CM | POA: Diagnosis not present

## 2021-10-25 DIAGNOSIS — R110 Nausea: Secondary | ICD-10-CM | POA: Diagnosis not present

## 2021-10-25 DIAGNOSIS — T7840XA Allergy, unspecified, initial encounter: Secondary | ICD-10-CM | POA: Diagnosis not present

## 2021-10-25 DIAGNOSIS — R52 Pain, unspecified: Secondary | ICD-10-CM | POA: Diagnosis not present

## 2021-10-25 DIAGNOSIS — Z992 Dependence on renal dialysis: Secondary | ICD-10-CM | POA: Diagnosis not present

## 2021-10-25 DIAGNOSIS — N186 End stage renal disease: Secondary | ICD-10-CM | POA: Diagnosis not present

## 2021-10-25 DIAGNOSIS — R209 Unspecified disturbances of skin sensation: Secondary | ICD-10-CM | POA: Diagnosis not present

## 2021-10-25 DIAGNOSIS — N2581 Secondary hyperparathyroidism of renal origin: Secondary | ICD-10-CM | POA: Diagnosis not present

## 2021-10-25 DIAGNOSIS — D631 Anemia in chronic kidney disease: Secondary | ICD-10-CM | POA: Diagnosis not present

## 2021-10-27 DIAGNOSIS — D689 Coagulation defect, unspecified: Secondary | ICD-10-CM | POA: Diagnosis not present

## 2021-10-27 DIAGNOSIS — R209 Unspecified disturbances of skin sensation: Secondary | ICD-10-CM | POA: Diagnosis not present

## 2021-10-27 DIAGNOSIS — N2581 Secondary hyperparathyroidism of renal origin: Secondary | ICD-10-CM | POA: Diagnosis not present

## 2021-10-27 DIAGNOSIS — Z992 Dependence on renal dialysis: Secondary | ICD-10-CM | POA: Diagnosis not present

## 2021-10-27 DIAGNOSIS — R52 Pain, unspecified: Secondary | ICD-10-CM | POA: Diagnosis not present

## 2021-10-27 DIAGNOSIS — T7840XA Allergy, unspecified, initial encounter: Secondary | ICD-10-CM | POA: Diagnosis not present

## 2021-10-27 DIAGNOSIS — R110 Nausea: Secondary | ICD-10-CM | POA: Diagnosis not present

## 2021-10-27 DIAGNOSIS — N186 End stage renal disease: Secondary | ICD-10-CM | POA: Diagnosis not present

## 2021-10-27 DIAGNOSIS — D631 Anemia in chronic kidney disease: Secondary | ICD-10-CM | POA: Diagnosis not present

## 2021-10-30 DIAGNOSIS — D689 Coagulation defect, unspecified: Secondary | ICD-10-CM | POA: Diagnosis not present

## 2021-10-30 DIAGNOSIS — R110 Nausea: Secondary | ICD-10-CM | POA: Diagnosis not present

## 2021-10-30 DIAGNOSIS — Z992 Dependence on renal dialysis: Secondary | ICD-10-CM | POA: Diagnosis not present

## 2021-10-30 DIAGNOSIS — N2581 Secondary hyperparathyroidism of renal origin: Secondary | ICD-10-CM | POA: Diagnosis not present

## 2021-10-30 DIAGNOSIS — N186 End stage renal disease: Secondary | ICD-10-CM | POA: Diagnosis not present

## 2021-10-30 DIAGNOSIS — T7840XA Allergy, unspecified, initial encounter: Secondary | ICD-10-CM | POA: Diagnosis not present

## 2021-10-30 DIAGNOSIS — D631 Anemia in chronic kidney disease: Secondary | ICD-10-CM | POA: Diagnosis not present

## 2021-10-30 DIAGNOSIS — R52 Pain, unspecified: Secondary | ICD-10-CM | POA: Diagnosis not present

## 2021-10-30 DIAGNOSIS — R209 Unspecified disturbances of skin sensation: Secondary | ICD-10-CM | POA: Diagnosis not present

## 2021-10-31 ENCOUNTER — Ambulatory Visit (HOSPITAL_COMMUNITY)
Admission: RE | Admit: 2021-10-31 | Discharge: 2021-10-31 | Disposition: A | Discharge status: Home / Self Care | Payer: Medicare Other | Source: Ambulatory Visit | Attending: Surgery | Admitting: Surgery

## 2021-10-31 DIAGNOSIS — N186 End stage renal disease: Secondary | ICD-10-CM | POA: Insufficient documentation

## 2021-10-31 DIAGNOSIS — Z992 Dependence on renal dialysis: Secondary | ICD-10-CM | POA: Diagnosis not present

## 2021-10-31 MED ORDER — LIDOCAINE HCL (PF) 2 % IJ SOLN
INTRAMUSCULAR | Status: AC
  Filled 2021-10-31: qty 10

## 2021-10-31 NOTE — Progress Notes (Signed)
S/p cath removal dressing CDI.

## 2021-10-31 NOTE — Progress Notes (Signed)
[facility] Catheter Removal Procedure Note       46 y/o male with ESRD has a working left UE AV graft.  He has a right TDC.  He is here for [facility] removal.  He denies anticoagulation.   PE:  left UE N/V/M intact Left UE AV graft with thrill Lungs non labored breathing    Diagnosis: ESRD with Functioning AVF/AVGG  Plan:  Remove right diatek catheter  Consent signed:  yes Time out completed:  yes Coumadin:  No. PT/INR (if applicable):   Other labs:   Procedure: 1.  Sterile prepping and draping over catheter area 2. 6 ml 2% lidocaine plain instilled at removal site. 3.  right catheter removed in its entirety with cuff in tact. 4.  Complications: none  5. Tip of catheter sent for culture:  no   Patient tolerated procedure well:  yes Pressure held, no bleeding noted, dressing applied Instructions given to the pt regarding wound care and bleeding.  Other:  Roxy Horseman 10/31/2021 12:32 PM

## 2021-11-01 DIAGNOSIS — R110 Nausea: Secondary | ICD-10-CM | POA: Diagnosis not present

## 2021-11-01 DIAGNOSIS — T7840XA Allergy, unspecified, initial encounter: Secondary | ICD-10-CM | POA: Diagnosis not present

## 2021-11-01 DIAGNOSIS — Z992 Dependence on renal dialysis: Secondary | ICD-10-CM | POA: Diagnosis not present

## 2021-11-01 DIAGNOSIS — D689 Coagulation defect, unspecified: Secondary | ICD-10-CM | POA: Diagnosis not present

## 2021-11-01 DIAGNOSIS — R52 Pain, unspecified: Secondary | ICD-10-CM | POA: Diagnosis not present

## 2021-11-01 DIAGNOSIS — N186 End stage renal disease: Secondary | ICD-10-CM | POA: Diagnosis not present

## 2021-11-01 DIAGNOSIS — D631 Anemia in chronic kidney disease: Secondary | ICD-10-CM | POA: Diagnosis not present

## 2021-11-01 DIAGNOSIS — R209 Unspecified disturbances of skin sensation: Secondary | ICD-10-CM | POA: Diagnosis not present

## 2021-11-01 DIAGNOSIS — N2581 Secondary hyperparathyroidism of renal origin: Secondary | ICD-10-CM | POA: Diagnosis not present

## 2021-11-03 DIAGNOSIS — Z992 Dependence on renal dialysis: Secondary | ICD-10-CM | POA: Diagnosis not present

## 2021-11-03 DIAGNOSIS — D689 Coagulation defect, unspecified: Secondary | ICD-10-CM | POA: Diagnosis not present

## 2021-11-03 DIAGNOSIS — N2581 Secondary hyperparathyroidism of renal origin: Secondary | ICD-10-CM | POA: Diagnosis not present

## 2021-11-03 DIAGNOSIS — N186 End stage renal disease: Secondary | ICD-10-CM | POA: Diagnosis not present

## 2021-11-03 DIAGNOSIS — T7840XA Allergy, unspecified, initial encounter: Secondary | ICD-10-CM | POA: Diagnosis not present

## 2021-11-03 DIAGNOSIS — D631 Anemia in chronic kidney disease: Secondary | ICD-10-CM | POA: Diagnosis not present

## 2021-11-03 DIAGNOSIS — R209 Unspecified disturbances of skin sensation: Secondary | ICD-10-CM | POA: Diagnosis not present

## 2021-11-03 DIAGNOSIS — R52 Pain, unspecified: Secondary | ICD-10-CM | POA: Diagnosis not present

## 2021-11-03 DIAGNOSIS — R110 Nausea: Secondary | ICD-10-CM | POA: Diagnosis not present

## 2021-11-05 IMAGING — CR DG ABDOMEN 2V
3 series · 3 of 3 positions shown · non-contrast
Comparison: 11/27/2019

CLINICAL DATA: Constipation, N/V today

EXAM:
ABDOMEN - 2 VIEW

[w abdomen upright]
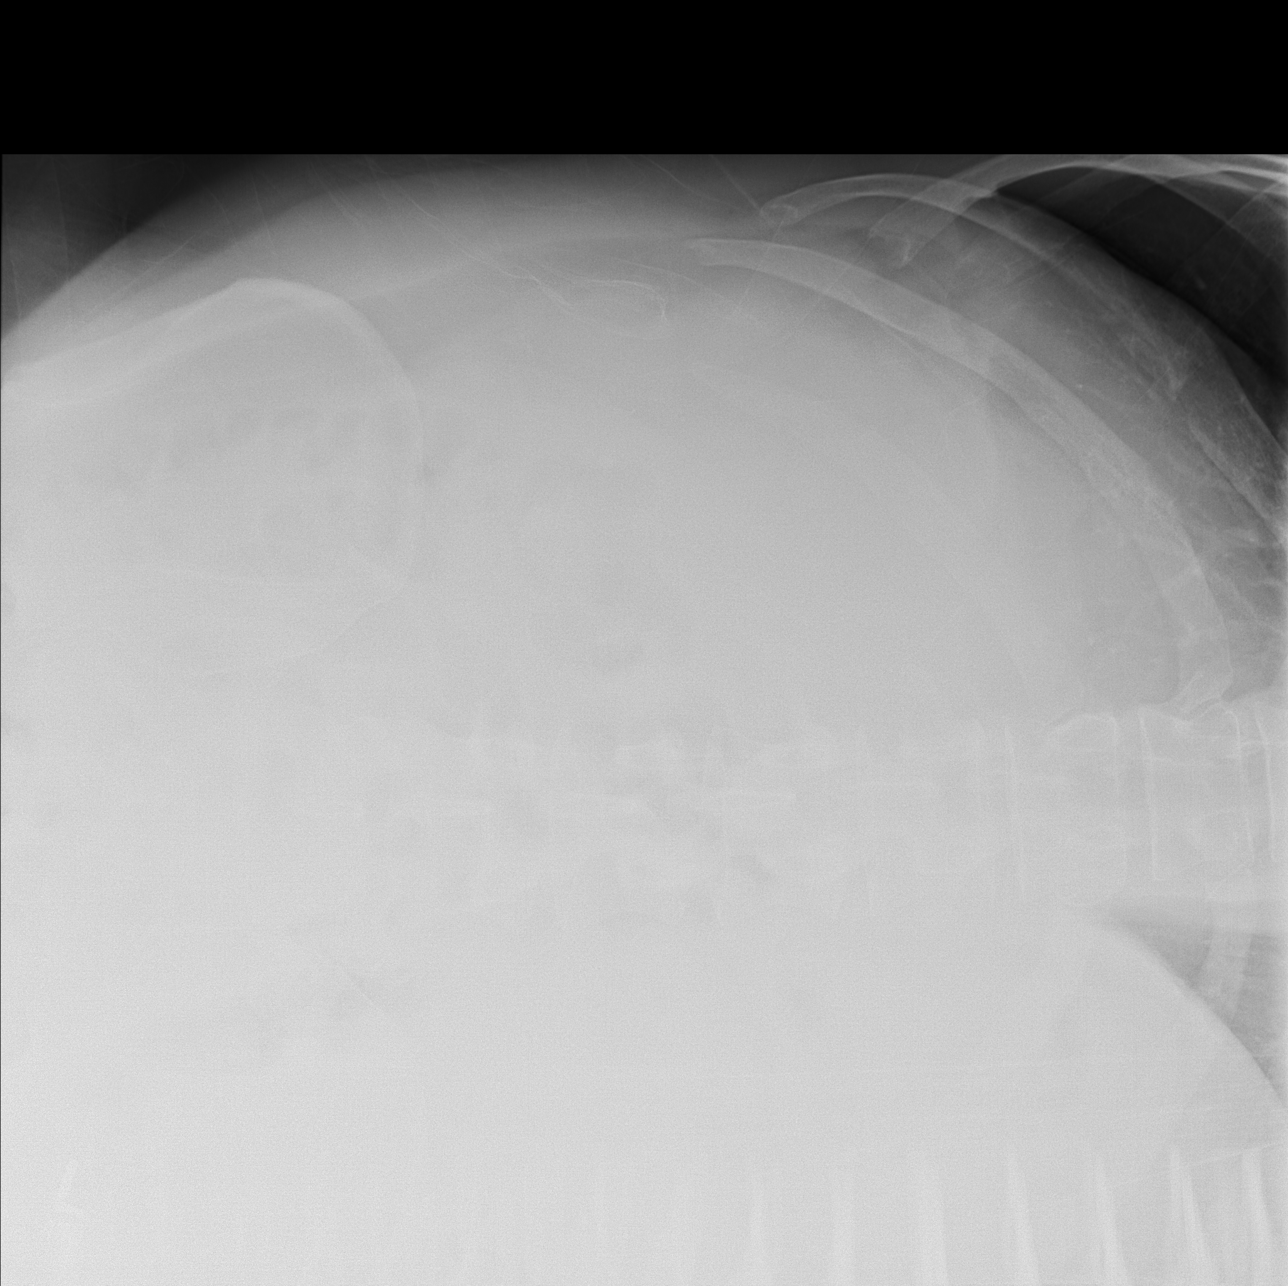

[t abdomen supine (1 of 2)]
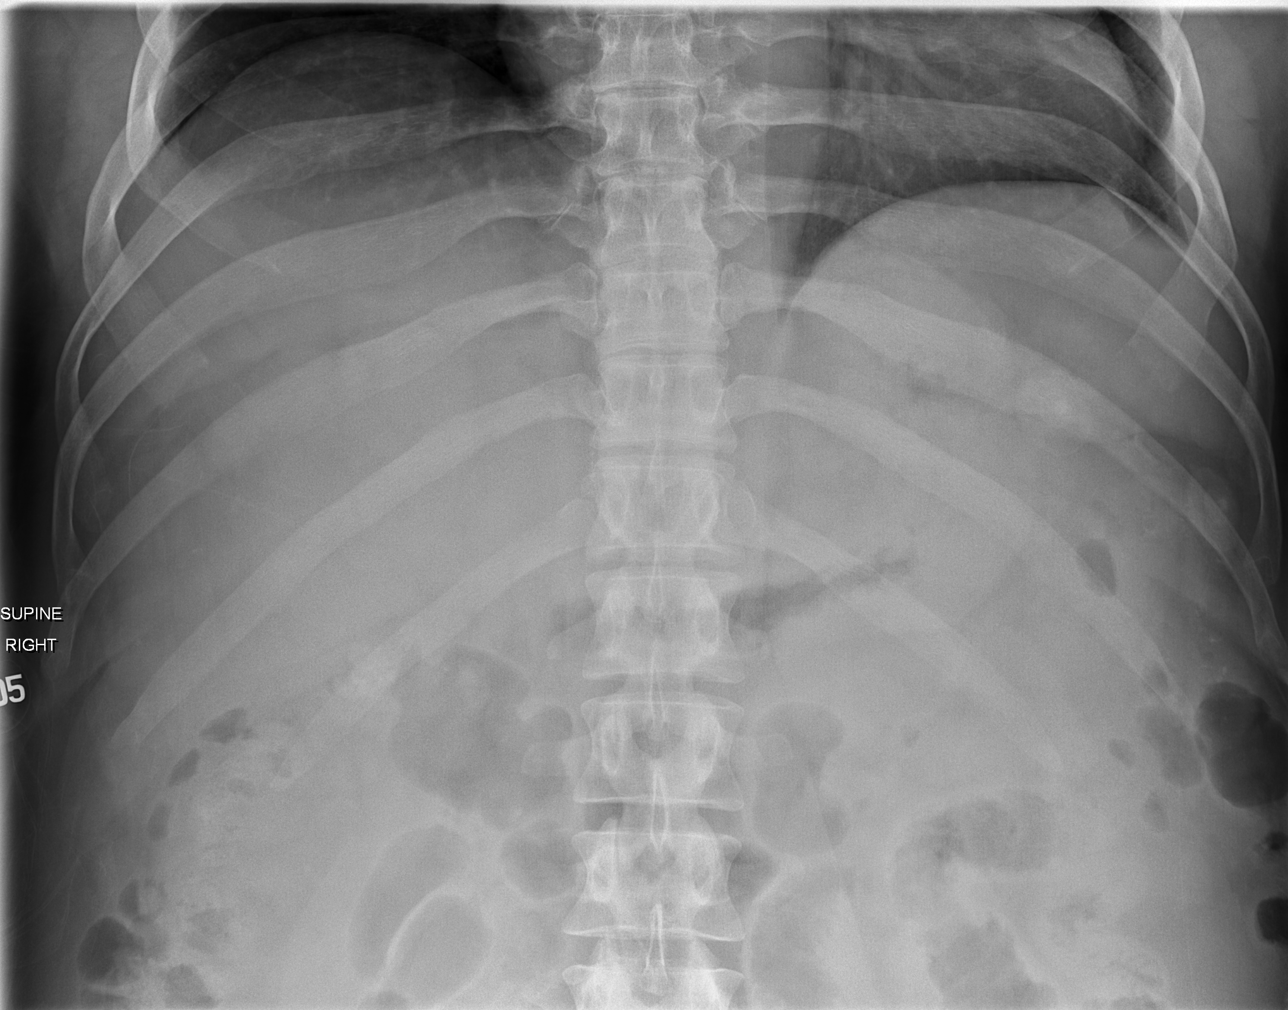

[t abdomen supine (2 of 2)]
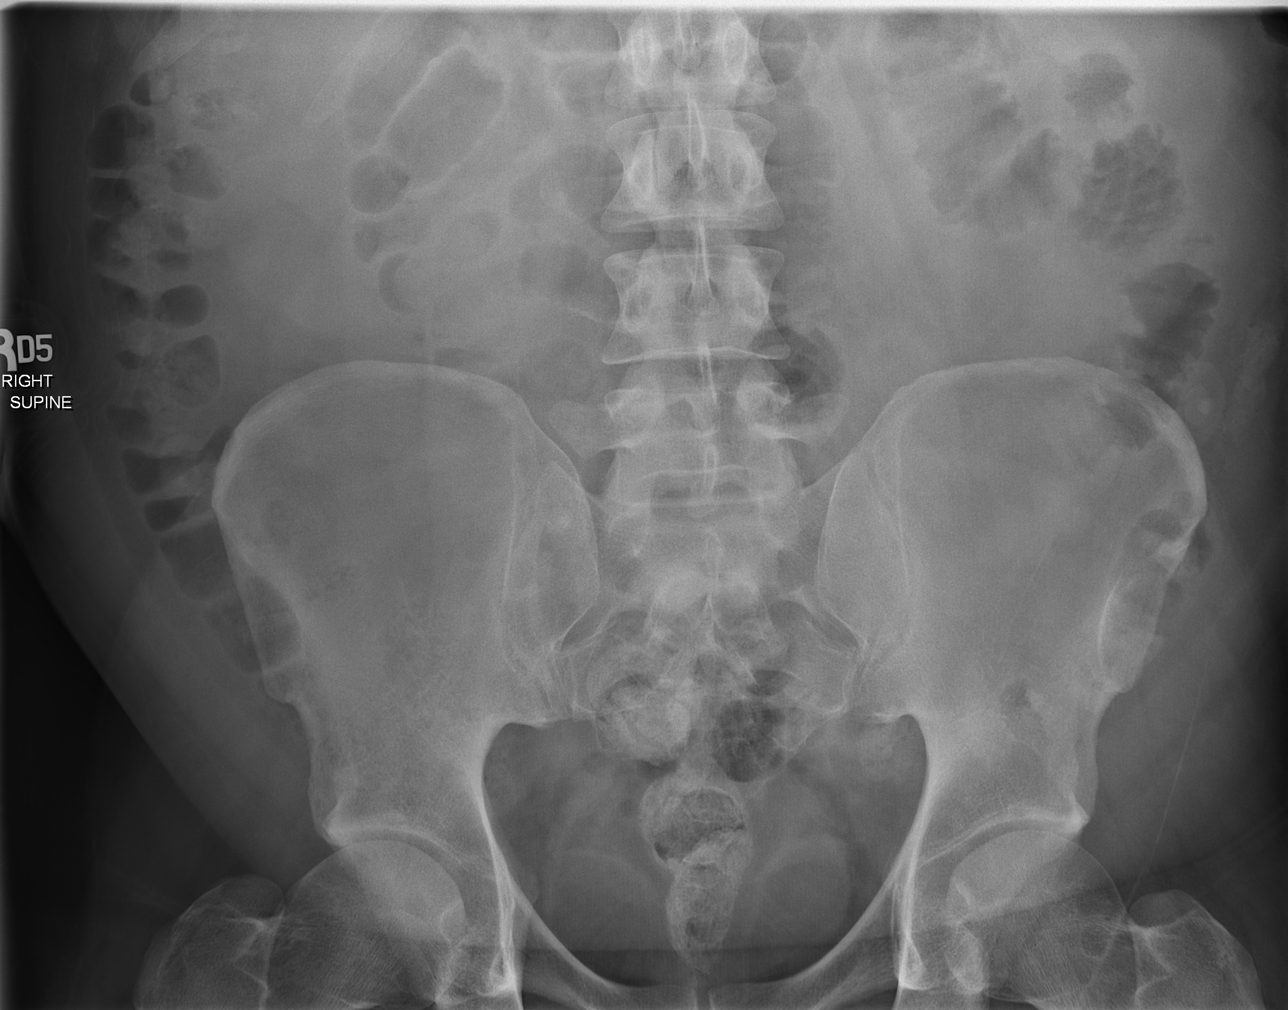

[3 of 3 positions shown; findings below may reference images not displayed]

FINDINGS: Visualized lung bases clear.

No free air.

Normal bowel gas pattern.

No abnormal abdominal calcifications.

Regional bones unremarkable.
IMPRESSION: Negative.

## 2021-11-06 DIAGNOSIS — R52 Pain, unspecified: Secondary | ICD-10-CM | POA: Diagnosis not present

## 2021-11-06 DIAGNOSIS — N2581 Secondary hyperparathyroidism of renal origin: Secondary | ICD-10-CM | POA: Diagnosis not present

## 2021-11-06 DIAGNOSIS — R209 Unspecified disturbances of skin sensation: Secondary | ICD-10-CM | POA: Diagnosis not present

## 2021-11-06 DIAGNOSIS — Z992 Dependence on renal dialysis: Secondary | ICD-10-CM | POA: Diagnosis not present

## 2021-11-06 DIAGNOSIS — T7840XA Allergy, unspecified, initial encounter: Secondary | ICD-10-CM | POA: Diagnosis not present

## 2021-11-06 DIAGNOSIS — D631 Anemia in chronic kidney disease: Secondary | ICD-10-CM | POA: Diagnosis not present

## 2021-11-06 DIAGNOSIS — D689 Coagulation defect, unspecified: Secondary | ICD-10-CM | POA: Diagnosis not present

## 2021-11-06 DIAGNOSIS — N186 End stage renal disease: Secondary | ICD-10-CM | POA: Diagnosis not present

## 2021-11-06 DIAGNOSIS — R110 Nausea: Secondary | ICD-10-CM | POA: Diagnosis not present

## 2021-11-08 DIAGNOSIS — G4733 Obstructive sleep apnea (adult) (pediatric): Secondary | ICD-10-CM | POA: Diagnosis not present

## 2021-11-08 DIAGNOSIS — N186 End stage renal disease: Secondary | ICD-10-CM | POA: Diagnosis not present

## 2021-11-08 DIAGNOSIS — N2581 Secondary hyperparathyroidism of renal origin: Secondary | ICD-10-CM | POA: Diagnosis not present

## 2021-11-08 DIAGNOSIS — R209 Unspecified disturbances of skin sensation: Secondary | ICD-10-CM | POA: Diagnosis not present

## 2021-11-08 DIAGNOSIS — T7840XA Allergy, unspecified, initial encounter: Secondary | ICD-10-CM | POA: Diagnosis not present

## 2021-11-08 DIAGNOSIS — Z992 Dependence on renal dialysis: Secondary | ICD-10-CM | POA: Diagnosis not present

## 2021-11-08 DIAGNOSIS — D631 Anemia in chronic kidney disease: Secondary | ICD-10-CM | POA: Diagnosis not present

## 2021-11-08 DIAGNOSIS — R52 Pain, unspecified: Secondary | ICD-10-CM | POA: Diagnosis not present

## 2021-11-08 DIAGNOSIS — R110 Nausea: Secondary | ICD-10-CM | POA: Diagnosis not present

## 2021-11-08 DIAGNOSIS — D689 Coagulation defect, unspecified: Secondary | ICD-10-CM | POA: Diagnosis not present

## 2021-11-10 DIAGNOSIS — D689 Coagulation defect, unspecified: Secondary | ICD-10-CM | POA: Diagnosis not present

## 2021-11-10 DIAGNOSIS — R52 Pain, unspecified: Secondary | ICD-10-CM | POA: Diagnosis not present

## 2021-11-10 DIAGNOSIS — Z992 Dependence on renal dialysis: Secondary | ICD-10-CM | POA: Diagnosis not present

## 2021-11-10 DIAGNOSIS — N186 End stage renal disease: Secondary | ICD-10-CM | POA: Diagnosis not present

## 2021-11-10 DIAGNOSIS — N2581 Secondary hyperparathyroidism of renal origin: Secondary | ICD-10-CM | POA: Diagnosis not present

## 2021-11-10 DIAGNOSIS — R209 Unspecified disturbances of skin sensation: Secondary | ICD-10-CM | POA: Diagnosis not present

## 2021-11-10 DIAGNOSIS — T7840XA Allergy, unspecified, initial encounter: Secondary | ICD-10-CM | POA: Diagnosis not present

## 2021-11-10 DIAGNOSIS — D631 Anemia in chronic kidney disease: Secondary | ICD-10-CM | POA: Diagnosis not present

## 2021-11-10 DIAGNOSIS — R110 Nausea: Secondary | ICD-10-CM | POA: Diagnosis not present

## 2021-11-13 DIAGNOSIS — N186 End stage renal disease: Secondary | ICD-10-CM | POA: Diagnosis not present

## 2021-11-13 DIAGNOSIS — N2581 Secondary hyperparathyroidism of renal origin: Secondary | ICD-10-CM | POA: Diagnosis not present

## 2021-11-13 DIAGNOSIS — R110 Nausea: Secondary | ICD-10-CM | POA: Diagnosis not present

## 2021-11-13 DIAGNOSIS — Z992 Dependence on renal dialysis: Secondary | ICD-10-CM | POA: Diagnosis not present

## 2021-11-13 DIAGNOSIS — D631 Anemia in chronic kidney disease: Secondary | ICD-10-CM | POA: Diagnosis not present

## 2021-11-13 DIAGNOSIS — R209 Unspecified disturbances of skin sensation: Secondary | ICD-10-CM | POA: Diagnosis not present

## 2021-11-13 DIAGNOSIS — T7840XA Allergy, unspecified, initial encounter: Secondary | ICD-10-CM | POA: Diagnosis not present

## 2021-11-13 DIAGNOSIS — R52 Pain, unspecified: Secondary | ICD-10-CM | POA: Diagnosis not present

## 2021-11-13 DIAGNOSIS — D689 Coagulation defect, unspecified: Secondary | ICD-10-CM | POA: Diagnosis not present

## 2021-11-15 DIAGNOSIS — R209 Unspecified disturbances of skin sensation: Secondary | ICD-10-CM | POA: Diagnosis not present

## 2021-11-15 DIAGNOSIS — R110 Nausea: Secondary | ICD-10-CM | POA: Diagnosis not present

## 2021-11-15 DIAGNOSIS — D689 Coagulation defect, unspecified: Secondary | ICD-10-CM | POA: Diagnosis not present

## 2021-11-15 DIAGNOSIS — D631 Anemia in chronic kidney disease: Secondary | ICD-10-CM | POA: Diagnosis not present

## 2021-11-15 DIAGNOSIS — N186 End stage renal disease: Secondary | ICD-10-CM | POA: Diagnosis not present

## 2021-11-15 DIAGNOSIS — T7840XA Allergy, unspecified, initial encounter: Secondary | ICD-10-CM | POA: Diagnosis not present

## 2021-11-15 DIAGNOSIS — R52 Pain, unspecified: Secondary | ICD-10-CM | POA: Diagnosis not present

## 2021-11-15 DIAGNOSIS — Z992 Dependence on renal dialysis: Secondary | ICD-10-CM | POA: Diagnosis not present

## 2021-11-15 DIAGNOSIS — N2581 Secondary hyperparathyroidism of renal origin: Secondary | ICD-10-CM | POA: Diagnosis not present

## 2021-11-16 ENCOUNTER — Encounter (HOSPITAL_BASED_OUTPATIENT_CLINIC_OR_DEPARTMENT_OTHER): Payer: Self-pay | Admitting: Emergency Medicine

## 2021-11-16 ENCOUNTER — Other Ambulatory Visit (HOSPITAL_BASED_OUTPATIENT_CLINIC_OR_DEPARTMENT_OTHER): Payer: Self-pay

## 2021-11-16 ENCOUNTER — Other Ambulatory Visit: Payer: Self-pay

## 2021-11-16 ENCOUNTER — Emergency Department (HOSPITAL_BASED_OUTPATIENT_CLINIC_OR_DEPARTMENT_OTHER)
Admission: EM | Admit: 2021-11-16 | Discharge: 2021-11-16 | Disposition: A | Discharge status: Home / Self Care | Payer: Medicare Other | Attending: Emergency Medicine | Admitting: Emergency Medicine

## 2021-11-16 DIAGNOSIS — M545 Low back pain, unspecified: Secondary | ICD-10-CM | POA: Diagnosis not present

## 2021-11-16 DIAGNOSIS — I10 Essential (primary) hypertension: Secondary | ICD-10-CM | POA: Insufficient documentation

## 2021-11-16 DIAGNOSIS — Z992 Dependence on renal dialysis: Secondary | ICD-10-CM | POA: Insufficient documentation

## 2021-11-16 DIAGNOSIS — N186 End stage renal disease: Secondary | ICD-10-CM | POA: Diagnosis not present

## 2021-11-16 MED ORDER — CYCLOBENZAPRINE HCL 10 MG PO TABS
10.0000 mg | ORAL_TABLET | Freq: Two times a day (BID) | ORAL | 0 refills | Status: DC | PRN
  Filled 2021-11-16: qty 20, 10d supply, fill #0

## 2021-11-16 NOTE — ED Triage Notes (Signed)
Pt arrives to ED with c/o back/buttocks pain.

## 2021-11-16 NOTE — Discharge Instructions (Addendum)
Take muscle relaxant Flexeril as prescribed.  This medication is sedating so do not take if doing dangerous activities including driving.  Do not mix with alcohol or drugs.

## 2021-11-16 NOTE — ED Provider Notes (Signed)
[place] EMERGENCY DEPT Provider Note   CSN: [id number] Arrival date & time: 11/16/21  1006     History  Chief Complaint  Patient presents with   Back Pain    Michael [name] is a 46 y.o. male.  Patient here with low back pain.  Normal vitals.  No fever.  Works a sedentary job and sits a lot.  Denies any trauma.  Denies any blood in his stool.  Denies any weakness or numbness or tingling.  No loss of bowel or bladder.  No pain with urination.  Nothing makes it worse or better.  He has tried Tylenol and ibuprofen.  The history is provided by the patient.       Home Medications Prior to Admission medications   Medication Sig Start Date End Date Taking? Authorizing Provider  cyclobenzaprine (FLEXERIL) 10 MG tablet Take 1 tablet (10 mg total) by mouth 2 (two) times daily as needed for muscle spasms. 11/16/21  Yes Roen Macgowan, DO  calcium acetate (PHOSLO) 667 MG capsule Take 1,334-2,001 mg by mouth See admin instructions. Take 1334 to 2001 mg with each meal (based on the size) 12/12/20   [provider]  ibuprofen (ADVIL) 200 MG tablet Take 600 mg by mouth every 6 (six) hours as needed for moderate pain.    [provider]  oxyCODONE (OXY IR/ROXICODONE) 5 MG immediate release tablet Take 1 tablet (5 mg total) by mouth every 8 (eight) hours as needed for severe pain. 09/15/21   Serafina Mitchell, MD  oxyCODONE (ROXICODONE) 5 MG immediate release tablet Take 1 tablet (5 mg total) by mouth every 8 (eight) hours as needed. Patient not taking: Reported on 10/13/2021 09/26/21 09/26/22  Vicente Serene P, PA-C  oxyCODONE-acetaminophen (PERCOCET) 10-325 MG tablet Take 1 tablet by mouth every 6 (six) hours as needed for pain. Patient not taking: Reported on 10/13/2021 09/03/21 09/03/22  [name], MD  polyethylene glycol (MIRALAX) 17 g packet Take 17 g by mouth daily. Patient taking differently: Take 17 g by mouth daily as needed (constipation.). 02/23/21   Sharyn Creamer, MD  PRESCRIPTION MEDICATION Inhale into the lungs at bedtime. CPAP    [provider]  isosorbide dinitrate (ISORDIL) 10 MG tablet Take 1 tablet (10 mg total) by mouth 2 (two) times daily. Patient not taking: No sig reported 02/04/20 03/24/20  [name], FNP      Allergies    Ivp dye [iodinated contrast media]    Review of Systems   Review of Systems  Physical Exam Updated Vital Signs BP (!) 143/97 (BP Location: Right Arm)   Pulse 97   Temp 99.2 F (37.3 C) (Oral)   Resp 16   Ht '6\' 2"'$  (1.88 m)   Wt 117 kg   SpO2 98%   BMI 33.13 kg/m  Physical Exam Vitals and nursing note reviewed.  Constitutional:      General: He is not in acute distress.    Appearance: He is well-developed.  HENT:     Head: Normocephalic and atraumatic.  Cardiovascular:     Rate and Rhythm: Normal rate and regular rhythm.     Pulses: Normal pulses.     Heart sounds: No murmur heard. Pulmonary:     Effort: Pulmonary effort is normal. No respiratory distress.     Breath sounds: Normal breath sounds.  Abdominal:     Palpations: Abdomen is soft.     Tenderness: There is no abdominal tenderness.  Musculoskeletal:  General: Tenderness present. No swelling.     Cervical back: Neck supple.     Comments: Tenderness to the right SI joint area  Skin:    General: Skin is warm and dry.     Capillary Refill: Capillary refill takes less than 2 seconds.  Neurological:     General: No focal deficit present.     Mental Status: He is alert.     Sensory: No sensory deficit.     Motor: No weakness.  Psychiatric:        Mood and Affect: Mood normal.     ED Results / Procedures / Treatments   Labs (all labs ordered are listed, but only abnormal results are displayed) Labs Reviewed - No data to display  EKG None  Radiology No results found.  Procedures Procedures    Medications Ordered in ED Medications - No data to display  ED Course/ Medical Decision Making/ A&P                            Medical Decision Making Risk Prescription drug management.   [name] is here with low back pain.  History of hypertension, dialysis, high cholesterol.  Overall he is point tender in his right SI joint area.  I suspect that this is a muscular process.  I have no concern for cauda equina or other acute emergent process.  He is not having any hemorrhoids or blood in his stool.  No pain with urination.  No abdominal pain.  Recommend Flexeril and Tylenol for pain relief.  Recommend rest and hydration.  He is neurovascular neuromuscular intact.  Discharged in good condition.  Understands return precautions.  Recommended that he does not take Motrin given his history of dialysis and end-stage renal disease.  This chart was dictated using voice recognition software.  Despite best efforts to proofread,  errors can occur which can change the documentation meaning.         Final Clinical Impression(s) / ED Diagnoses Final diagnoses:  Acute low back pain without sciatica, unspecified back pain laterality    Rx / DC Orders ED Discharge Orders          Ordered    cyclobenzaprine (FLEXERIL) 10 MG tablet  2 times daily PRN        11/16/21 1018              Allin Frix, DO 11/16/21 1026

## 2021-11-16 NOTE — ED Notes (Signed)
Patient verbalizes understanding of discharge instructions. Opportunity for questioning and answers were provided. Patient discharged from ED.  °

## 2021-11-17 DIAGNOSIS — L299 Pruritus, unspecified: Secondary | ICD-10-CM | POA: Diagnosis not present

## 2021-11-17 DIAGNOSIS — Z992 Dependence on renal dialysis: Secondary | ICD-10-CM | POA: Diagnosis not present

## 2021-11-17 DIAGNOSIS — R209 Unspecified disturbances of skin sensation: Secondary | ICD-10-CM | POA: Diagnosis not present

## 2021-11-17 DIAGNOSIS — R110 Nausea: Secondary | ICD-10-CM | POA: Diagnosis not present

## 2021-11-17 DIAGNOSIS — T7840XA Allergy, unspecified, initial encounter: Secondary | ICD-10-CM | POA: Diagnosis not present

## 2021-11-17 DIAGNOSIS — N186 End stage renal disease: Secondary | ICD-10-CM | POA: Diagnosis not present

## 2021-11-17 DIAGNOSIS — D631 Anemia in chronic kidney disease: Secondary | ICD-10-CM | POA: Diagnosis not present

## 2021-11-17 DIAGNOSIS — R52 Pain, unspecified: Secondary | ICD-10-CM | POA: Diagnosis not present

## 2021-11-17 DIAGNOSIS — D689 Coagulation defect, unspecified: Secondary | ICD-10-CM | POA: Diagnosis not present

## 2021-11-17 DIAGNOSIS — N2581 Secondary hyperparathyroidism of renal origin: Secondary | ICD-10-CM | POA: Diagnosis not present

## 2021-11-21 DIAGNOSIS — N2581 Secondary hyperparathyroidism of renal origin: Secondary | ICD-10-CM | POA: Diagnosis not present

## 2021-11-21 DIAGNOSIS — Z992 Dependence on renal dialysis: Secondary | ICD-10-CM | POA: Diagnosis not present

## 2021-11-21 DIAGNOSIS — T7840XA Allergy, unspecified, initial encounter: Secondary | ICD-10-CM | POA: Diagnosis not present

## 2021-11-21 DIAGNOSIS — N186 End stage renal disease: Secondary | ICD-10-CM | POA: Diagnosis not present

## 2021-11-21 DIAGNOSIS — R209 Unspecified disturbances of skin sensation: Secondary | ICD-10-CM | POA: Diagnosis not present

## 2021-11-21 DIAGNOSIS — L299 Pruritus, unspecified: Secondary | ICD-10-CM | POA: Diagnosis not present

## 2021-11-21 DIAGNOSIS — R52 Pain, unspecified: Secondary | ICD-10-CM | POA: Diagnosis not present

## 2021-11-21 DIAGNOSIS — R110 Nausea: Secondary | ICD-10-CM | POA: Diagnosis not present

## 2021-11-21 DIAGNOSIS — D631 Anemia in chronic kidney disease: Secondary | ICD-10-CM | POA: Diagnosis not present

## 2021-11-21 DIAGNOSIS — D689 Coagulation defect, unspecified: Secondary | ICD-10-CM | POA: Diagnosis not present

## 2021-11-22 DIAGNOSIS — Z992 Dependence on renal dialysis: Secondary | ICD-10-CM | POA: Diagnosis not present

## 2021-11-22 DIAGNOSIS — R209 Unspecified disturbances of skin sensation: Secondary | ICD-10-CM | POA: Diagnosis not present

## 2021-11-22 DIAGNOSIS — N2581 Secondary hyperparathyroidism of renal origin: Secondary | ICD-10-CM | POA: Diagnosis not present

## 2021-11-22 DIAGNOSIS — T7840XA Allergy, unspecified, initial encounter: Secondary | ICD-10-CM | POA: Diagnosis not present

## 2021-11-22 DIAGNOSIS — L299 Pruritus, unspecified: Secondary | ICD-10-CM | POA: Diagnosis not present

## 2021-11-22 DIAGNOSIS — R52 Pain, unspecified: Secondary | ICD-10-CM | POA: Diagnosis not present

## 2021-11-22 DIAGNOSIS — R110 Nausea: Secondary | ICD-10-CM | POA: Diagnosis not present

## 2021-11-22 DIAGNOSIS — D689 Coagulation defect, unspecified: Secondary | ICD-10-CM | POA: Diagnosis not present

## 2021-11-22 DIAGNOSIS — D631 Anemia in chronic kidney disease: Secondary | ICD-10-CM | POA: Diagnosis not present

## 2021-11-22 DIAGNOSIS — N186 End stage renal disease: Secondary | ICD-10-CM | POA: Diagnosis not present

## 2021-11-24 DIAGNOSIS — N186 End stage renal disease: Secondary | ICD-10-CM | POA: Diagnosis not present

## 2021-11-24 DIAGNOSIS — R209 Unspecified disturbances of skin sensation: Secondary | ICD-10-CM | POA: Diagnosis not present

## 2021-11-24 DIAGNOSIS — T7840XA Allergy, unspecified, initial encounter: Secondary | ICD-10-CM | POA: Diagnosis not present

## 2021-11-24 DIAGNOSIS — R110 Nausea: Secondary | ICD-10-CM | POA: Diagnosis not present

## 2021-11-24 DIAGNOSIS — Z992 Dependence on renal dialysis: Secondary | ICD-10-CM | POA: Diagnosis not present

## 2021-11-24 DIAGNOSIS — D631 Anemia in chronic kidney disease: Secondary | ICD-10-CM | POA: Diagnosis not present

## 2021-11-24 DIAGNOSIS — D689 Coagulation defect, unspecified: Secondary | ICD-10-CM | POA: Diagnosis not present

## 2021-11-24 DIAGNOSIS — N2581 Secondary hyperparathyroidism of renal origin: Secondary | ICD-10-CM | POA: Diagnosis not present

## 2021-11-24 DIAGNOSIS — R52 Pain, unspecified: Secondary | ICD-10-CM | POA: Diagnosis not present

## 2021-11-24 DIAGNOSIS — L299 Pruritus, unspecified: Secondary | ICD-10-CM | POA: Diagnosis not present

## 2021-11-27 DIAGNOSIS — Z992 Dependence on renal dialysis: Secondary | ICD-10-CM | POA: Diagnosis not present

## 2021-11-27 DIAGNOSIS — D631 Anemia in chronic kidney disease: Secondary | ICD-10-CM | POA: Diagnosis not present

## 2021-11-27 DIAGNOSIS — L299 Pruritus, unspecified: Secondary | ICD-10-CM | POA: Diagnosis not present

## 2021-11-27 DIAGNOSIS — N2581 Secondary hyperparathyroidism of renal origin: Secondary | ICD-10-CM | POA: Diagnosis not present

## 2021-11-27 DIAGNOSIS — R110 Nausea: Secondary | ICD-10-CM | POA: Diagnosis not present

## 2021-11-27 DIAGNOSIS — R209 Unspecified disturbances of skin sensation: Secondary | ICD-10-CM | POA: Diagnosis not present

## 2021-11-27 DIAGNOSIS — D689 Coagulation defect, unspecified: Secondary | ICD-10-CM | POA: Diagnosis not present

## 2021-11-27 DIAGNOSIS — T7840XA Allergy, unspecified, initial encounter: Secondary | ICD-10-CM | POA: Diagnosis not present

## 2021-11-27 DIAGNOSIS — N186 End stage renal disease: Secondary | ICD-10-CM | POA: Diagnosis not present

## 2021-11-27 DIAGNOSIS — R52 Pain, unspecified: Secondary | ICD-10-CM | POA: Diagnosis not present

## 2021-11-29 DIAGNOSIS — D689 Coagulation defect, unspecified: Secondary | ICD-10-CM | POA: Diagnosis not present

## 2021-11-29 DIAGNOSIS — D631 Anemia in chronic kidney disease: Secondary | ICD-10-CM | POA: Diagnosis not present

## 2021-11-29 DIAGNOSIS — R209 Unspecified disturbances of skin sensation: Secondary | ICD-10-CM | POA: Diagnosis not present

## 2021-11-29 DIAGNOSIS — Z992 Dependence on renal dialysis: Secondary | ICD-10-CM | POA: Diagnosis not present

## 2021-11-29 DIAGNOSIS — N186 End stage renal disease: Secondary | ICD-10-CM | POA: Diagnosis not present

## 2021-11-29 DIAGNOSIS — R52 Pain, unspecified: Secondary | ICD-10-CM | POA: Diagnosis not present

## 2021-11-29 DIAGNOSIS — N2581 Secondary hyperparathyroidism of renal origin: Secondary | ICD-10-CM | POA: Diagnosis not present

## 2021-11-29 DIAGNOSIS — R110 Nausea: Secondary | ICD-10-CM | POA: Diagnosis not present

## 2021-11-29 DIAGNOSIS — L299 Pruritus, unspecified: Secondary | ICD-10-CM | POA: Diagnosis not present

## 2021-11-29 DIAGNOSIS — T7840XA Allergy, unspecified, initial encounter: Secondary | ICD-10-CM | POA: Diagnosis not present

## 2021-12-01 DIAGNOSIS — L299 Pruritus, unspecified: Secondary | ICD-10-CM | POA: Diagnosis not present

## 2021-12-01 DIAGNOSIS — T7840XA Allergy, unspecified, initial encounter: Secondary | ICD-10-CM | POA: Diagnosis not present

## 2021-12-01 DIAGNOSIS — R209 Unspecified disturbances of skin sensation: Secondary | ICD-10-CM | POA: Diagnosis not present

## 2021-12-01 DIAGNOSIS — Z992 Dependence on renal dialysis: Secondary | ICD-10-CM | POA: Diagnosis not present

## 2021-12-01 DIAGNOSIS — D631 Anemia in chronic kidney disease: Secondary | ICD-10-CM | POA: Diagnosis not present

## 2021-12-01 DIAGNOSIS — R52 Pain, unspecified: Secondary | ICD-10-CM | POA: Diagnosis not present

## 2021-12-01 DIAGNOSIS — N186 End stage renal disease: Secondary | ICD-10-CM | POA: Diagnosis not present

## 2021-12-01 DIAGNOSIS — D689 Coagulation defect, unspecified: Secondary | ICD-10-CM | POA: Diagnosis not present

## 2021-12-01 DIAGNOSIS — N2581 Secondary hyperparathyroidism of renal origin: Secondary | ICD-10-CM | POA: Diagnosis not present

## 2021-12-01 DIAGNOSIS — R110 Nausea: Secondary | ICD-10-CM | POA: Diagnosis not present

## 2021-12-04 DIAGNOSIS — D631 Anemia in chronic kidney disease: Secondary | ICD-10-CM | POA: Diagnosis not present

## 2021-12-04 DIAGNOSIS — N186 End stage renal disease: Secondary | ICD-10-CM | POA: Diagnosis not present

## 2021-12-04 DIAGNOSIS — T7840XA Allergy, unspecified, initial encounter: Secondary | ICD-10-CM | POA: Diagnosis not present

## 2021-12-04 DIAGNOSIS — L299 Pruritus, unspecified: Secondary | ICD-10-CM | POA: Diagnosis not present

## 2021-12-04 DIAGNOSIS — R110 Nausea: Secondary | ICD-10-CM | POA: Diagnosis not present

## 2021-12-04 DIAGNOSIS — R209 Unspecified disturbances of skin sensation: Secondary | ICD-10-CM | POA: Diagnosis not present

## 2021-12-04 DIAGNOSIS — R52 Pain, unspecified: Secondary | ICD-10-CM | POA: Diagnosis not present

## 2021-12-04 DIAGNOSIS — N2581 Secondary hyperparathyroidism of renal origin: Secondary | ICD-10-CM | POA: Diagnosis not present

## 2021-12-04 DIAGNOSIS — Z992 Dependence on renal dialysis: Secondary | ICD-10-CM | POA: Diagnosis not present

## 2021-12-04 DIAGNOSIS — D689 Coagulation defect, unspecified: Secondary | ICD-10-CM | POA: Diagnosis not present

## 2021-12-06 ENCOUNTER — Telehealth: Payer: Self-pay

## 2021-12-06 DIAGNOSIS — T7840XA Allergy, unspecified, initial encounter: Secondary | ICD-10-CM | POA: Diagnosis not present

## 2021-12-06 DIAGNOSIS — R209 Unspecified disturbances of skin sensation: Secondary | ICD-10-CM | POA: Diagnosis not present

## 2021-12-06 DIAGNOSIS — D689 Coagulation defect, unspecified: Secondary | ICD-10-CM | POA: Diagnosis not present

## 2021-12-06 DIAGNOSIS — R52 Pain, unspecified: Secondary | ICD-10-CM | POA: Diagnosis not present

## 2021-12-06 DIAGNOSIS — R110 Nausea: Secondary | ICD-10-CM | POA: Diagnosis not present

## 2021-12-06 DIAGNOSIS — L299 Pruritus, unspecified: Secondary | ICD-10-CM | POA: Diagnosis not present

## 2021-12-06 DIAGNOSIS — N2581 Secondary hyperparathyroidism of renal origin: Secondary | ICD-10-CM | POA: Diagnosis not present

## 2021-12-06 DIAGNOSIS — Z992 Dependence on renal dialysis: Secondary | ICD-10-CM | POA: Diagnosis not present

## 2021-12-06 DIAGNOSIS — D631 Anemia in chronic kidney disease: Secondary | ICD-10-CM | POA: Diagnosis not present

## 2021-12-06 DIAGNOSIS — N186 End stage renal disease: Secondary | ICD-10-CM | POA: Diagnosis not present

## 2021-12-06 NOTE — Patient Outreach (Signed)
  Care Coordination   12/06/2021 Name: Petro Talent MRN: [id number] DOB: 02/16/1976   Care Coordination Outreach Attempts:  An unsuccessful telephone outreach was attempted for a scheduled appointment today.  Follow Up Plan:  Additional outreach attempts will be made to offer the patient care coordination information and services.   Encounter Outcome:  No Answer  Care Coordination Interventions Activated:  No   Care Coordination Interventions:  No, not indicated    Thea Silversmith, RN, MSN, BSN, [place] Coordinator [phone]

## 2021-12-08 ENCOUNTER — Telehealth: Payer: Self-pay | Admitting: *Deleted

## 2021-12-08 DIAGNOSIS — T7840XA Allergy, unspecified, initial encounter: Secondary | ICD-10-CM | POA: Diagnosis not present

## 2021-12-08 DIAGNOSIS — L299 Pruritus, unspecified: Secondary | ICD-10-CM | POA: Diagnosis not present

## 2021-12-08 DIAGNOSIS — R52 Pain, unspecified: Secondary | ICD-10-CM | POA: Diagnosis not present

## 2021-12-08 DIAGNOSIS — Z992 Dependence on renal dialysis: Secondary | ICD-10-CM | POA: Diagnosis not present

## 2021-12-08 DIAGNOSIS — D689 Coagulation defect, unspecified: Secondary | ICD-10-CM | POA: Diagnosis not present

## 2021-12-08 DIAGNOSIS — R110 Nausea: Secondary | ICD-10-CM | POA: Diagnosis not present

## 2021-12-08 DIAGNOSIS — N2581 Secondary hyperparathyroidism of renal origin: Secondary | ICD-10-CM | POA: Diagnosis not present

## 2021-12-08 DIAGNOSIS — R209 Unspecified disturbances of skin sensation: Secondary | ICD-10-CM | POA: Diagnosis not present

## 2021-12-08 DIAGNOSIS — D631 Anemia in chronic kidney disease: Secondary | ICD-10-CM | POA: Diagnosis not present

## 2021-12-08 DIAGNOSIS — N186 End stage renal disease: Secondary | ICD-10-CM | POA: Diagnosis not present

## 2021-12-08 NOTE — Chronic Care Management (AMB) (Signed)
  Care Coordination  Outreach Note  12/08/2021 Name: Michael [name] MRN: [id number] DOB: Aug 31, 1975   Care Coordination Outreach Attempts: An unsuccessful telephone outreach was attempted today to offer the patient information about available care coordination services as a benefit of their health plan.   Follow Up Plan:  Additional outreach attempts will be made to offer the patient care coordination information and services.   Encounter Outcome:  No Answer  Julian Hy, [place] Direct Dial: [phone]

## 2021-12-11 DIAGNOSIS — T7840XA Allergy, unspecified, initial encounter: Secondary | ICD-10-CM | POA: Diagnosis not present

## 2021-12-11 DIAGNOSIS — N186 End stage renal disease: Secondary | ICD-10-CM | POA: Diagnosis not present

## 2021-12-11 DIAGNOSIS — D689 Coagulation defect, unspecified: Secondary | ICD-10-CM | POA: Diagnosis not present

## 2021-12-11 DIAGNOSIS — R52 Pain, unspecified: Secondary | ICD-10-CM | POA: Diagnosis not present

## 2021-12-11 DIAGNOSIS — L299 Pruritus, unspecified: Secondary | ICD-10-CM | POA: Diagnosis not present

## 2021-12-11 DIAGNOSIS — N2581 Secondary hyperparathyroidism of renal origin: Secondary | ICD-10-CM | POA: Diagnosis not present

## 2021-12-11 DIAGNOSIS — R209 Unspecified disturbances of skin sensation: Secondary | ICD-10-CM | POA: Diagnosis not present

## 2021-12-11 DIAGNOSIS — R110 Nausea: Secondary | ICD-10-CM | POA: Diagnosis not present

## 2021-12-11 DIAGNOSIS — Z992 Dependence on renal dialysis: Secondary | ICD-10-CM | POA: Diagnosis not present

## 2021-12-11 DIAGNOSIS — D631 Anemia in chronic kidney disease: Secondary | ICD-10-CM | POA: Diagnosis not present

## 2021-12-13 DIAGNOSIS — N2581 Secondary hyperparathyroidism of renal origin: Secondary | ICD-10-CM | POA: Diagnosis not present

## 2021-12-13 DIAGNOSIS — L299 Pruritus, unspecified: Secondary | ICD-10-CM | POA: Diagnosis not present

## 2021-12-13 DIAGNOSIS — N186 End stage renal disease: Secondary | ICD-10-CM | POA: Diagnosis not present

## 2021-12-13 DIAGNOSIS — D631 Anemia in chronic kidney disease: Secondary | ICD-10-CM | POA: Diagnosis not present

## 2021-12-13 DIAGNOSIS — Z992 Dependence on renal dialysis: Secondary | ICD-10-CM | POA: Diagnosis not present

## 2021-12-13 DIAGNOSIS — R52 Pain, unspecified: Secondary | ICD-10-CM | POA: Diagnosis not present

## 2021-12-13 DIAGNOSIS — T7840XA Allergy, unspecified, initial encounter: Secondary | ICD-10-CM | POA: Diagnosis not present

## 2021-12-13 DIAGNOSIS — D689 Coagulation defect, unspecified: Secondary | ICD-10-CM | POA: Diagnosis not present

## 2021-12-13 DIAGNOSIS — R110 Nausea: Secondary | ICD-10-CM | POA: Diagnosis not present

## 2021-12-13 DIAGNOSIS — R209 Unspecified disturbances of skin sensation: Secondary | ICD-10-CM | POA: Diagnosis not present

## 2021-12-14 ENCOUNTER — Encounter (HOSPITAL_BASED_OUTPATIENT_CLINIC_OR_DEPARTMENT_OTHER): Payer: Self-pay

## 2021-12-14 ENCOUNTER — Emergency Department (HOSPITAL_BASED_OUTPATIENT_CLINIC_OR_DEPARTMENT_OTHER)
Admission: EM | Admit: 2021-12-14 | Discharge: 2021-12-14 | Disposition: A | Discharge status: Home / Self Care | Payer: Medicare Other | Attending: Emergency Medicine | Admitting: Emergency Medicine

## 2021-12-14 ENCOUNTER — Other Ambulatory Visit: Payer: Self-pay

## 2021-12-14 ENCOUNTER — Emergency Department (HOSPITAL_BASED_OUTPATIENT_CLINIC_OR_DEPARTMENT_OTHER)
Admission: EM | Admit: 2021-12-14 | Discharge: 2021-12-14 | Discharge status: Absent without leave | Payer: Medicare Other | Source: Home / Self Care

## 2021-12-14 DIAGNOSIS — I120 Hypertensive chronic kidney disease with stage 5 chronic kidney disease or end stage renal disease: Secondary | ICD-10-CM | POA: Insufficient documentation

## 2021-12-14 DIAGNOSIS — N186 End stage renal disease: Secondary | ICD-10-CM | POA: Insufficient documentation

## 2021-12-14 DIAGNOSIS — Z992 Dependence on renal dialysis: Secondary | ICD-10-CM | POA: Diagnosis not present

## 2021-12-14 DIAGNOSIS — K0889 Other specified disorders of teeth and supporting structures: Secondary | ICD-10-CM | POA: Diagnosis not present

## 2021-12-14 MED ORDER — LIDOCAINE VISCOUS HCL 2 % MT SOLN: 10.0000 mL | Freq: Four times a day (QID) | OROMUCOSAL | 0 refills | Status: DC | PRN

## 2021-12-14 MED ORDER — LIDOCAINE-EPINEPHRINE (PF) 2 %-1:200000 IJ SOLN
INTRAMUSCULAR | Status: AC
  Filled 2021-12-14: qty 20

## 2021-12-14 MED ORDER — LIDOCAINE-EPINEPHRINE (PF) 2 %-1:200000 IJ SOLN
10.0000 mL | Freq: Once | INTRAMUSCULAR | Status: AC
  Administered 2021-12-14: 2 mL
  Filled 2021-12-14: qty 20

## 2021-12-14 MED ORDER — BUPIVACAINE-EPINEPHRINE (PF) 0.5% -1:200000 IJ SOLN
1.8000 mL | Freq: Once | INTRAMUSCULAR | Status: AC
  Administered 2021-12-14: 1.8 mL
  Filled 2021-12-14: qty 1.8

## 2021-12-14 MED ORDER — LIDOCAINE HCL (CARDIAC) PF 100 MG/5ML IV SOSY
PREFILLED_SYRINGE | INTRAVENOUS | Status: AC
  Filled 2021-12-14: qty 5

## 2021-12-14 MED ORDER — AMOXICILLIN-POT CLAVULANATE 875-125 MG PO TABS: 1.0000 | ORAL_TABLET | Freq: Two times a day (BID) | ORAL | 0 refills | Status: DC

## 2021-12-14 MED ORDER — ACETAMINOPHEN 500 MG PO TABS
1000.0000 mg | ORAL_TABLET | Freq: Once | ORAL | Status: AC
  Administered 2021-12-14: 1000 mg via ORAL
  Filled 2021-12-14: qty 2

## 2021-12-14 MED ORDER — LIDOCAINE-EPINEPHRINE 2 %-1:50000 IJ SOLN
1.7000 mL | Freq: Once | INTRAMUSCULAR | Status: DC
Start: 2021-12-14 — End: 2021-12-14

## 2021-12-14 MED ORDER — LIDOCAINE VISCOUS HCL 2 % MT SOLN
15.0000 mL | Freq: Once | OROMUCOSAL | Status: AC
  Administered 2021-12-14: 15 mL via OROMUCOSAL
  Filled 2021-12-14: qty 15

## 2021-12-14 NOTE — ED Notes (Signed)
Pt. returned stating his pain had not improved. Dr. Leonette Monarch did not want pt to be re-registered. Dr. Leonette Monarch  saw him in triage and administered tylenol, vicious lidocaine, and injected the area of pain with lidocaine-epi. Pt tolerated well. Dr. Leonette Monarch provided RX to the 24 hour pharmacy. Pt voiced understanding of all instructions.

## 2021-12-14 NOTE — ED Triage Notes (Signed)
POV, pt c/o left sided teeth pain, top and bottom, had recent fillings on 9/26. Pt tried salt water at home without relief. A&O x4, ambulatory.

## 2021-12-14 NOTE — ED Provider Notes (Signed)
[facility] EMERG[facility] DEPT Provider Note  CSN: [id number] Arrival date & time: 12/14/21 0356  Chief Complaint(s) Dental Pain  HPI Michael [name] is a 46 y.o. male here for 1 day of left lower and upper dental pain gradually worsened throughout the day.  He reports having fillings placed last week without complication.  Pain is now severe, throbbing, radiating from teeth up to the temple.  Has tried over-the-counter medicine at home without relief.  Denies any fevers, nausea, vomiting, chest discomfort, other issues.  The history is provided by the patient.    Past Medical History Past Medical History:  Diagnosis Date   GERD (gastroesophageal reflux disease)    Gout    History of renal dialysis    M-W-F   HLD (hyperlipidemia)    Hypertension    Intracranial aneurysm    s/p coil embolization: right middle meningeal artery 09/08/20, pericallosal artery 09/22/20, right MCA 12/22/20   Pneumonia    Polycystic kidney disease    SDH (subdural hematoma) ([place])    subacute right SDH 08/23/20 CTA head   Sleep apnea 07/29/2018   uses cpap nightly   Vitamin D deficiency 11/2018   Patient Active Problem List   Diagnosis Date Noted   Cerebral aneurysm 09/08/2020   Elevated blood-pressure reading, without diagnosis of hypertension 08/01/2020   SDH (subdural hematoma) ([place]) 07/19/2020   AKI (acute kidney injury) ([place]) 12/07/2019   Uremia 12/02/2019   Chest pain 12/02/2019   Elevated troponin 12/02/2019   Anemia in CKD (chronic kidney disease) 12/02/2019   Adjustment disorder with mixed anxiety and depressed mood 11/28/2019   Renal failure 11/25/2019   Hyperkalemia 11/25/2019   Normocytic anemia 03/47/4259   Metabolic acidosis, increased anion gap    Hematochezia    Chronic gout due to renal impairment without tophus 12/14/2018   Polycystic kidney disease 11/09/2016   ESRD on hemodialysis ([place]) 11/09/2016   Observed sleep apnea 09/01/2016   Excessive daytime sleepiness  09/01/2016   Primary snoring 09/01/2016   Acute pyelonephritis 05/25/2016   Acute gouty arthritis 05/25/2016   Hematuria 05/25/2016   Essential hypertension, benign 05/25/2016   Body mass index (BMI) 34.0-34.9, adult 05/25/2016   Hypertensive crisis 05/23/2016   Adult polycystic kidney disease 05/23/2016   SIRS (systemic inflammatory response syndrome) ([place]) 05/23/2016   Home Medication(s) Prior to Admission medications   Medication Sig Start Date End Date Taking? Authorizing Provider  amoxicillin-clavulanate (AUGMENTIN) 875-125 MG tablet Take 1 tablet by mouth every 12 (twelve) hours. 12/14/21  Yes Kedrick Mcnamee, Grayce Sessions, MD  calcium acetate (PHOSLO) 667 MG capsule Take 1,334-2,001 mg by mouth See admin instructions. Take 1334 to 2001 mg with each meal (based on the size) 12/12/20   [provider]  cyclobenzaprine (FLEXERIL) 10 MG tablet Take 1 tablet (10 mg total) by mouth 2 (two) times daily as needed for muscle spasms. 11/16/21   Curatolo, Adam, DO  ibuprofen (ADVIL) 200 MG tablet Take 600 mg by mouth every 6 (six) hours as needed for moderate pain.    [provider]  oxyCODONE (OXY IR/ROXICODONE) 5 MG immediate release tablet Take 1 tablet (5 mg total) by mouth every 8 (eight) hours as needed for severe pain. 09/15/21   Serafina Mitchell, MD  oxyCODONE (ROXICODONE) 5 MG immediate release tablet Take 1 tablet (5 mg total) by mouth every 8 (eight) hours as needed. Patient not taking: Reported on 10/13/2021 09/26/21 09/26/22  Vicente Serene P, PA-C  oxyCODONE-acetaminophen (PERCOCET) 10-325 MG tablet Take 1 tablet by  mouth every 6 (six) hours as needed for pain. Patient not taking: Reported on 10/13/2021 09/03/21 09/03/22  [name], MD  polyethylene glycol (MIRALAX) 17 g packet Take 17 g by mouth daily. Patient taking differently: Take 17 g by mouth daily as needed (constipation.). 02/23/21   Sharyn Creamer, MD  PRESCRIPTION MEDICATION Inhale into the lungs at bedtime. CPAP     [provider]  isosorbide dinitrate (ISORDIL) 10 MG tablet Take 1 tablet (10 mg total) by mouth 2 (two) times daily. Patient not taking: No sig reported 02/04/20 03/24/20  [name], FNP                                                                                                                                    Allergies Ivp dye [iodinated contrast media]  Review of Systems Review of Systems As noted in HPI  Physical Exam Vital Signs  I have reviewed the triage vital signs BP (!) 144/104   Pulse 97   Temp 98.4 F (36.9 C) (Oral)   Resp 16   Ht '6\' 2"'$  (1.88 m)   Wt 117 kg   SpO2 100%   BMI 33.12 kg/m   Physical Exam Vitals reviewed.  Constitutional:      General: He is not in acute distress.    Appearance: He is well-developed. He is not diaphoretic.  HENT:     Head: Normocephalic and atraumatic.     Right Ear: External ear normal.     Left Ear: External ear normal.     Nose: Nose normal.     Mouth/Throat:     Mouth: Mucous membranes are moist.     Dentition: Dental tenderness present. No gingival swelling or dental abscesses.     Tongue: No lesions.     Palate: No lesions.   Eyes:     General: No scleral icterus.    Conjunctiva/sclera: Conjunctivae normal.  Neck:     Trachea: Phonation normal.  Cardiovascular:     Rate and Rhythm: Normal rate and regular rhythm.  Pulmonary:     Effort: Pulmonary effort is normal. No respiratory distress.     Breath sounds: No stridor.  Abdominal:     General: There is no distension.  Musculoskeletal:        General: Normal range of motion.     Cervical back: Normal range of motion.  Neurological:     Mental Status: He is alert and oriented to person, place, and time.  Psychiatric:        Behavior: Behavior normal.     ED Results and Treatments Labs (all labs ordered are listed, but only abnormal results are displayed) Labs Reviewed - No data to display  EKG  EKG Interpretation  Date/Time:    Ventricular Rate:    PR Interval:    QRS Duration:   QT Interval:    QTC Calculation:   R Axis:     Text Interpretation:         Radiology No results found.  Medications Ordered in ED Medications  bupivacaine-epinephrine (PF) (MARCAINE W/ EPI) 0.5% -1:200000 injection 1.8 mL (1.8 mLs Infiltration Given by Other 12/14/21 0506)                                                                                                                                     Procedures Dental Block  Date/Time: 12/14/2021 5:22 AM  Performed by: Fatima Blank, MD Authorized by: Fatima Blank, MD   Consent:    Consent obtained:  Verbal   Consent given by:  Patient   Risks discussed:  Allergic reaction, infection, nerve damage, pain and intravascular injection   Alternatives discussed:  No treatment Universal protocol:    Procedure explained and questions answered to patient or proxy's satisfaction: yes     Patient identity confirmed:  Arm band and verbally with patient Indications:    Indications: dental pain   Location:    Block type:  Supraperiosteal   Supraperiosteal location:  Upper teeth   Upper teeth location:  15/LU 2nd molar Procedure details:    Anesthetic injected:  Lidocaine 2% WITH epi Post-procedure details:    Outcome:  Pain relieved   Procedure completion:  Tolerated Dental Block  Date/Time: 12/14/2021 5:23 AM  Performed by: Fatima Blank, MD Authorized by: Fatima Blank, MD   Consent:    Consent obtained:  Verbal   Consent given by:  Patient   Risks discussed:  Allergic reaction, infection, intravascular injection, nerve damage, pain and unsuccessful block   Alternatives discussed:  Delayed treatment and no treatment Universal protocol:    Procedure explained and questions answered to patient or proxy's satisfaction:  yes     Patient identity confirmed:  Verbally with patient and arm band Indications:    Indications: dental pain   Location:    Block type:  Inferior alveolar   Laterality:  Left Procedure details:    Needle gauge:  27 G   Anesthetic injected:  Lidocaine 2% WITH epi   Injection procedure:  Anatomic landmarks identified, introduced needle, incremental injection, negative aspiration for blood and anatomic landmarks palpated Post-procedure details:    Outcome:  Anesthesia achieved   Procedure completion:  Tolerated   (including critical care time)  Medical Decision Making / ED Course   Medical Decision Making Risk Prescription drug management.    Patient presents with dental pain Primary dental, not referred pain. Fillings in place No obvious signs of infection/dental abscess requiring I&D. Pain control with periosteal and inferior alveolar blocks. Will Rx antibiotics for possible pulpitis       Final Clinical Impression(s) / ED  Diagnoses Final diagnoses:  Pain, dental   The patient appears reasonably screened and/or stabilized for discharge and I doubt any other medical condition or other [facility] requiring further screening, evaluation, or treatment in the ED at this time. I have discussed the findings, Dx and Tx plan with the patient/family who expressed understanding and agree(s) with the plan. Discharge instructions discussed at length. The patient/family was given strict return precautions who verbalized understanding of the instructions. No further questions at time of discharge.  Disposition: Discharge  Condition: Good  ED Discharge Orders          Ordered    amoxicillin-clavulanate (AUGMENTIN) 875-125 MG tablet  Every 12 hours        12/14/21 0525             Follow Up: Dentist  Call  to schedule an appointment for close follow up           This chart was dictated using voice recognition software.  Despite best efforts to proofread,  errors  can occur which can change the documentation meaning.    Fatima Blank, MD 12/14/21 [phone]

## 2021-12-15 DIAGNOSIS — R52 Pain, unspecified: Secondary | ICD-10-CM | POA: Diagnosis not present

## 2021-12-15 DIAGNOSIS — N186 End stage renal disease: Secondary | ICD-10-CM | POA: Diagnosis not present

## 2021-12-15 DIAGNOSIS — D631 Anemia in chronic kidney disease: Secondary | ICD-10-CM | POA: Diagnosis not present

## 2021-12-15 DIAGNOSIS — R209 Unspecified disturbances of skin sensation: Secondary | ICD-10-CM | POA: Diagnosis not present

## 2021-12-15 DIAGNOSIS — N2581 Secondary hyperparathyroidism of renal origin: Secondary | ICD-10-CM | POA: Diagnosis not present

## 2021-12-15 DIAGNOSIS — T7840XA Allergy, unspecified, initial encounter: Secondary | ICD-10-CM | POA: Diagnosis not present

## 2021-12-15 DIAGNOSIS — L299 Pruritus, unspecified: Secondary | ICD-10-CM | POA: Diagnosis not present

## 2021-12-15 DIAGNOSIS — D689 Coagulation defect, unspecified: Secondary | ICD-10-CM | POA: Diagnosis not present

## 2021-12-15 DIAGNOSIS — R110 Nausea: Secondary | ICD-10-CM | POA: Diagnosis not present

## 2021-12-15 DIAGNOSIS — Z992 Dependence on renal dialysis: Secondary | ICD-10-CM | POA: Diagnosis not present

## 2021-12-16 DIAGNOSIS — N186 End stage renal disease: Secondary | ICD-10-CM | POA: Diagnosis not present

## 2021-12-16 DIAGNOSIS — Z992 Dependence on renal dialysis: Secondary | ICD-10-CM | POA: Diagnosis not present

## 2021-12-18 DIAGNOSIS — L299 Pruritus, unspecified: Secondary | ICD-10-CM | POA: Diagnosis not present

## 2021-12-18 DIAGNOSIS — N186 End stage renal disease: Secondary | ICD-10-CM | POA: Diagnosis not present

## 2021-12-18 DIAGNOSIS — T782XXA Anaphylactic shock, unspecified, initial encounter: Secondary | ICD-10-CM | POA: Diagnosis not present

## 2021-12-18 DIAGNOSIS — D631 Anemia in chronic kidney disease: Secondary | ICD-10-CM | POA: Diagnosis not present

## 2021-12-18 DIAGNOSIS — R110 Nausea: Secondary | ICD-10-CM | POA: Diagnosis not present

## 2021-12-18 DIAGNOSIS — R209 Unspecified disturbances of skin sensation: Secondary | ICD-10-CM | POA: Diagnosis not present

## 2021-12-18 DIAGNOSIS — Z992 Dependence on renal dialysis: Secondary | ICD-10-CM | POA: Diagnosis not present

## 2021-12-18 DIAGNOSIS — Z23 Encounter for immunization: Secondary | ICD-10-CM | POA: Diagnosis not present

## 2021-12-18 DIAGNOSIS — D689 Coagulation defect, unspecified: Secondary | ICD-10-CM | POA: Diagnosis not present

## 2021-12-18 DIAGNOSIS — N2581 Secondary hyperparathyroidism of renal origin: Secondary | ICD-10-CM | POA: Diagnosis not present

## 2021-12-18 DIAGNOSIS — R52 Pain, unspecified: Secondary | ICD-10-CM | POA: Diagnosis not present

## 2021-12-20 DIAGNOSIS — R110 Nausea: Secondary | ICD-10-CM | POA: Diagnosis not present

## 2021-12-20 DIAGNOSIS — N186 End stage renal disease: Secondary | ICD-10-CM | POA: Diagnosis not present

## 2021-12-20 DIAGNOSIS — L299 Pruritus, unspecified: Secondary | ICD-10-CM | POA: Diagnosis not present

## 2021-12-20 DIAGNOSIS — N2581 Secondary hyperparathyroidism of renal origin: Secondary | ICD-10-CM | POA: Diagnosis not present

## 2021-12-20 DIAGNOSIS — D689 Coagulation defect, unspecified: Secondary | ICD-10-CM | POA: Diagnosis not present

## 2021-12-20 DIAGNOSIS — R209 Unspecified disturbances of skin sensation: Secondary | ICD-10-CM | POA: Diagnosis not present

## 2021-12-20 DIAGNOSIS — D631 Anemia in chronic kidney disease: Secondary | ICD-10-CM | POA: Diagnosis not present

## 2021-12-20 DIAGNOSIS — R52 Pain, unspecified: Secondary | ICD-10-CM | POA: Diagnosis not present

## 2021-12-20 DIAGNOSIS — T782XXA Anaphylactic shock, unspecified, initial encounter: Secondary | ICD-10-CM | POA: Diagnosis not present

## 2021-12-20 DIAGNOSIS — Z23 Encounter for immunization: Secondary | ICD-10-CM | POA: Diagnosis not present

## 2021-12-20 DIAGNOSIS — Z992 Dependence on renal dialysis: Secondary | ICD-10-CM | POA: Diagnosis not present

## 2021-12-21 NOTE — Chronic Care Management (AMB) (Signed)
  Care Coordination   Note   12/21/2021 Name: Michael [name] MRN: [id number] DOB: 1976/02/15  Michael [name] is a 46 y.o. year old male who sees Pcp, No for primary care. I reached out to [employer] by phone today to offer care coordination services.  Rescheduling   Michael [name] was given information about Care Coordination services today including:   The Care Coordination services include support from the care team which includes your Nurse Coordinator, Clinical Social Worker, or Pharmacist.  The Care Coordination team is here to help remove barriers to the health concerns and goals most important to you. Care Coordination services are voluntary, and the patient may decline or stop services at any time by request to their care team member.   Care Coordination Consent Status: Patient agreed to services and verbal consent obtained.   Follow up plan:  Telephone appointment with care coordination team member scheduled for:  12/29/2021  Encounter Outcome:  Pt. Scheduled  Julian Hy, [place] Direct Dial: [phone]

## 2021-12-22 DIAGNOSIS — N2581 Secondary hyperparathyroidism of renal origin: Secondary | ICD-10-CM | POA: Diagnosis not present

## 2021-12-22 DIAGNOSIS — Z23 Encounter for immunization: Secondary | ICD-10-CM | POA: Diagnosis not present

## 2021-12-22 DIAGNOSIS — N186 End stage renal disease: Secondary | ICD-10-CM | POA: Diagnosis not present

## 2021-12-22 DIAGNOSIS — D631 Anemia in chronic kidney disease: Secondary | ICD-10-CM | POA: Diagnosis not present

## 2021-12-22 DIAGNOSIS — R110 Nausea: Secondary | ICD-10-CM | POA: Diagnosis not present

## 2021-12-22 DIAGNOSIS — Z992 Dependence on renal dialysis: Secondary | ICD-10-CM | POA: Diagnosis not present

## 2021-12-22 DIAGNOSIS — T782XXA Anaphylactic shock, unspecified, initial encounter: Secondary | ICD-10-CM | POA: Diagnosis not present

## 2021-12-22 DIAGNOSIS — R52 Pain, unspecified: Secondary | ICD-10-CM | POA: Diagnosis not present

## 2021-12-22 DIAGNOSIS — D689 Coagulation defect, unspecified: Secondary | ICD-10-CM | POA: Diagnosis not present

## 2021-12-22 DIAGNOSIS — R209 Unspecified disturbances of skin sensation: Secondary | ICD-10-CM | POA: Diagnosis not present

## 2021-12-22 DIAGNOSIS — L299 Pruritus, unspecified: Secondary | ICD-10-CM | POA: Diagnosis not present

## 2021-12-25 DIAGNOSIS — Z23 Encounter for immunization: Secondary | ICD-10-CM | POA: Diagnosis not present

## 2021-12-25 DIAGNOSIS — R52 Pain, unspecified: Secondary | ICD-10-CM | POA: Diagnosis not present

## 2021-12-25 DIAGNOSIS — T782XXA Anaphylactic shock, unspecified, initial encounter: Secondary | ICD-10-CM | POA: Diagnosis not present

## 2021-12-25 DIAGNOSIS — D631 Anemia in chronic kidney disease: Secondary | ICD-10-CM | POA: Diagnosis not present

## 2021-12-25 DIAGNOSIS — N2581 Secondary hyperparathyroidism of renal origin: Secondary | ICD-10-CM | POA: Diagnosis not present

## 2021-12-25 DIAGNOSIS — N186 End stage renal disease: Secondary | ICD-10-CM | POA: Diagnosis not present

## 2021-12-25 DIAGNOSIS — Z992 Dependence on renal dialysis: Secondary | ICD-10-CM | POA: Diagnosis not present

## 2021-12-25 DIAGNOSIS — R110 Nausea: Secondary | ICD-10-CM | POA: Diagnosis not present

## 2021-12-25 DIAGNOSIS — R209 Unspecified disturbances of skin sensation: Secondary | ICD-10-CM | POA: Diagnosis not present

## 2021-12-25 DIAGNOSIS — D689 Coagulation defect, unspecified: Secondary | ICD-10-CM | POA: Diagnosis not present

## 2021-12-25 DIAGNOSIS — L299 Pruritus, unspecified: Secondary | ICD-10-CM | POA: Diagnosis not present

## 2021-12-27 DIAGNOSIS — Z992 Dependence on renal dialysis: Secondary | ICD-10-CM | POA: Diagnosis not present

## 2021-12-27 DIAGNOSIS — N2581 Secondary hyperparathyroidism of renal origin: Secondary | ICD-10-CM | POA: Diagnosis not present

## 2021-12-27 DIAGNOSIS — T782XXA Anaphylactic shock, unspecified, initial encounter: Secondary | ICD-10-CM | POA: Diagnosis not present

## 2021-12-27 DIAGNOSIS — N186 End stage renal disease: Secondary | ICD-10-CM | POA: Diagnosis not present

## 2021-12-27 DIAGNOSIS — D631 Anemia in chronic kidney disease: Secondary | ICD-10-CM | POA: Diagnosis not present

## 2021-12-27 DIAGNOSIS — D689 Coagulation defect, unspecified: Secondary | ICD-10-CM | POA: Diagnosis not present

## 2021-12-27 DIAGNOSIS — R52 Pain, unspecified: Secondary | ICD-10-CM | POA: Diagnosis not present

## 2021-12-27 DIAGNOSIS — Z23 Encounter for immunization: Secondary | ICD-10-CM | POA: Diagnosis not present

## 2021-12-27 DIAGNOSIS — L299 Pruritus, unspecified: Secondary | ICD-10-CM | POA: Diagnosis not present

## 2021-12-27 DIAGNOSIS — R110 Nausea: Secondary | ICD-10-CM | POA: Diagnosis not present

## 2021-12-27 DIAGNOSIS — R209 Unspecified disturbances of skin sensation: Secondary | ICD-10-CM | POA: Diagnosis not present

## 2021-12-29 ENCOUNTER — Telehealth: Payer: Self-pay

## 2021-12-29 DIAGNOSIS — Z992 Dependence on renal dialysis: Secondary | ICD-10-CM | POA: Diagnosis not present

## 2021-12-29 DIAGNOSIS — D631 Anemia in chronic kidney disease: Secondary | ICD-10-CM | POA: Diagnosis not present

## 2021-12-29 DIAGNOSIS — Z23 Encounter for immunization: Secondary | ICD-10-CM | POA: Diagnosis not present

## 2021-12-29 DIAGNOSIS — T782XXA Anaphylactic shock, unspecified, initial encounter: Secondary | ICD-10-CM | POA: Diagnosis not present

## 2021-12-29 DIAGNOSIS — R110 Nausea: Secondary | ICD-10-CM | POA: Diagnosis not present

## 2021-12-29 DIAGNOSIS — N186 End stage renal disease: Secondary | ICD-10-CM | POA: Diagnosis not present

## 2021-12-29 DIAGNOSIS — N2581 Secondary hyperparathyroidism of renal origin: Secondary | ICD-10-CM | POA: Diagnosis not present

## 2021-12-29 DIAGNOSIS — D689 Coagulation defect, unspecified: Secondary | ICD-10-CM | POA: Diagnosis not present

## 2021-12-29 DIAGNOSIS — L299 Pruritus, unspecified: Secondary | ICD-10-CM | POA: Diagnosis not present

## 2021-12-29 DIAGNOSIS — R52 Pain, unspecified: Secondary | ICD-10-CM | POA: Diagnosis not present

## 2021-12-29 DIAGNOSIS — R209 Unspecified disturbances of skin sensation: Secondary | ICD-10-CM | POA: Diagnosis not present

## 2021-12-29 NOTE — Patient Outreach (Signed)
  Care Coordination   12/29/2021 Name: Knoah Nedeau MRN: [id number] DOB: [birth year]   Care Coordination Outreach Attempts:  An unsuccessful telephone outreach was attempted for a scheduled appointment today.  Follow Up Plan:  Additional outreach attempts will be made to offer the patient care coordination information and services.   Encounter Outcome:  No Answer  Care Coordination Interventions Activated:  No   Care Coordination Interventions:  No, not indicated    Thea Silversmith, RN, MSN, BSN, [place] Coordinator [phone]

## 2022-01-01 DIAGNOSIS — Z992 Dependence on renal dialysis: Secondary | ICD-10-CM | POA: Diagnosis not present

## 2022-01-01 DIAGNOSIS — T782XXA Anaphylactic shock, unspecified, initial encounter: Secondary | ICD-10-CM | POA: Diagnosis not present

## 2022-01-01 DIAGNOSIS — R52 Pain, unspecified: Secondary | ICD-10-CM | POA: Diagnosis not present

## 2022-01-01 DIAGNOSIS — N186 End stage renal disease: Secondary | ICD-10-CM | POA: Diagnosis not present

## 2022-01-01 DIAGNOSIS — N2581 Secondary hyperparathyroidism of renal origin: Secondary | ICD-10-CM | POA: Diagnosis not present

## 2022-01-01 DIAGNOSIS — D689 Coagulation defect, unspecified: Secondary | ICD-10-CM | POA: Diagnosis not present

## 2022-01-01 DIAGNOSIS — L299 Pruritus, unspecified: Secondary | ICD-10-CM | POA: Diagnosis not present

## 2022-01-01 DIAGNOSIS — D631 Anemia in chronic kidney disease: Secondary | ICD-10-CM | POA: Diagnosis not present

## 2022-01-01 DIAGNOSIS — R209 Unspecified disturbances of skin sensation: Secondary | ICD-10-CM | POA: Diagnosis not present

## 2022-01-01 DIAGNOSIS — Z23 Encounter for immunization: Secondary | ICD-10-CM | POA: Diagnosis not present

## 2022-01-01 DIAGNOSIS — R110 Nausea: Secondary | ICD-10-CM | POA: Diagnosis not present

## 2022-01-02 DIAGNOSIS — S0502XA Injury of conjunctiva and corneal abrasion without foreign body, left eye, initial encounter: Secondary | ICD-10-CM | POA: Diagnosis not present

## 2022-01-03 DIAGNOSIS — R110 Nausea: Secondary | ICD-10-CM | POA: Diagnosis not present

## 2022-01-03 DIAGNOSIS — D689 Coagulation defect, unspecified: Secondary | ICD-10-CM | POA: Diagnosis not present

## 2022-01-03 DIAGNOSIS — N186 End stage renal disease: Secondary | ICD-10-CM | POA: Diagnosis not present

## 2022-01-03 DIAGNOSIS — D631 Anemia in chronic kidney disease: Secondary | ICD-10-CM | POA: Diagnosis not present

## 2022-01-03 DIAGNOSIS — Z992 Dependence on renal dialysis: Secondary | ICD-10-CM | POA: Diagnosis not present

## 2022-01-03 DIAGNOSIS — Z23 Encounter for immunization: Secondary | ICD-10-CM | POA: Diagnosis not present

## 2022-01-03 DIAGNOSIS — R209 Unspecified disturbances of skin sensation: Secondary | ICD-10-CM | POA: Diagnosis not present

## 2022-01-03 DIAGNOSIS — N2581 Secondary hyperparathyroidism of renal origin: Secondary | ICD-10-CM | POA: Diagnosis not present

## 2022-01-03 DIAGNOSIS — R52 Pain, unspecified: Secondary | ICD-10-CM | POA: Diagnosis not present

## 2022-01-03 DIAGNOSIS — L299 Pruritus, unspecified: Secondary | ICD-10-CM | POA: Diagnosis not present

## 2022-01-03 DIAGNOSIS — T782XXA Anaphylactic shock, unspecified, initial encounter: Secondary | ICD-10-CM | POA: Diagnosis not present

## 2022-01-05 DIAGNOSIS — D631 Anemia in chronic kidney disease: Secondary | ICD-10-CM | POA: Diagnosis not present

## 2022-01-05 DIAGNOSIS — D689 Coagulation defect, unspecified: Secondary | ICD-10-CM | POA: Diagnosis not present

## 2022-01-05 DIAGNOSIS — Z992 Dependence on renal dialysis: Secondary | ICD-10-CM | POA: Diagnosis not present

## 2022-01-05 DIAGNOSIS — N186 End stage renal disease: Secondary | ICD-10-CM | POA: Diagnosis not present

## 2022-01-05 DIAGNOSIS — N2581 Secondary hyperparathyroidism of renal origin: Secondary | ICD-10-CM | POA: Diagnosis not present

## 2022-01-05 DIAGNOSIS — R52 Pain, unspecified: Secondary | ICD-10-CM | POA: Diagnosis not present

## 2022-01-05 DIAGNOSIS — R209 Unspecified disturbances of skin sensation: Secondary | ICD-10-CM | POA: Diagnosis not present

## 2022-01-05 DIAGNOSIS — R110 Nausea: Secondary | ICD-10-CM | POA: Diagnosis not present

## 2022-01-05 DIAGNOSIS — Z23 Encounter for immunization: Secondary | ICD-10-CM | POA: Diagnosis not present

## 2022-01-05 DIAGNOSIS — T782XXA Anaphylactic shock, unspecified, initial encounter: Secondary | ICD-10-CM | POA: Diagnosis not present

## 2022-01-05 DIAGNOSIS — L299 Pruritus, unspecified: Secondary | ICD-10-CM | POA: Diagnosis not present

## 2022-01-08 DIAGNOSIS — L299 Pruritus, unspecified: Secondary | ICD-10-CM | POA: Diagnosis not present

## 2022-01-08 DIAGNOSIS — R209 Unspecified disturbances of skin sensation: Secondary | ICD-10-CM | POA: Diagnosis not present

## 2022-01-08 DIAGNOSIS — R110 Nausea: Secondary | ICD-10-CM | POA: Diagnosis not present

## 2022-01-08 DIAGNOSIS — Z992 Dependence on renal dialysis: Secondary | ICD-10-CM | POA: Diagnosis not present

## 2022-01-08 DIAGNOSIS — N186 End stage renal disease: Secondary | ICD-10-CM | POA: Diagnosis not present

## 2022-01-08 DIAGNOSIS — D631 Anemia in chronic kidney disease: Secondary | ICD-10-CM | POA: Diagnosis not present

## 2022-01-08 DIAGNOSIS — N2581 Secondary hyperparathyroidism of renal origin: Secondary | ICD-10-CM | POA: Diagnosis not present

## 2022-01-08 DIAGNOSIS — Z23 Encounter for immunization: Secondary | ICD-10-CM | POA: Diagnosis not present

## 2022-01-08 DIAGNOSIS — R52 Pain, unspecified: Secondary | ICD-10-CM | POA: Diagnosis not present

## 2022-01-08 DIAGNOSIS — T782XXA Anaphylactic shock, unspecified, initial encounter: Secondary | ICD-10-CM | POA: Diagnosis not present

## 2022-01-08 DIAGNOSIS — D689 Coagulation defect, unspecified: Secondary | ICD-10-CM | POA: Diagnosis not present

## 2022-01-10 DIAGNOSIS — N186 End stage renal disease: Secondary | ICD-10-CM | POA: Diagnosis not present

## 2022-01-10 DIAGNOSIS — R209 Unspecified disturbances of skin sensation: Secondary | ICD-10-CM | POA: Diagnosis not present

## 2022-01-10 DIAGNOSIS — R110 Nausea: Secondary | ICD-10-CM | POA: Diagnosis not present

## 2022-01-10 DIAGNOSIS — L299 Pruritus, unspecified: Secondary | ICD-10-CM | POA: Diagnosis not present

## 2022-01-10 DIAGNOSIS — N2581 Secondary hyperparathyroidism of renal origin: Secondary | ICD-10-CM | POA: Diagnosis not present

## 2022-01-10 DIAGNOSIS — T782XXA Anaphylactic shock, unspecified, initial encounter: Secondary | ICD-10-CM | POA: Diagnosis not present

## 2022-01-10 DIAGNOSIS — D631 Anemia in chronic kidney disease: Secondary | ICD-10-CM | POA: Diagnosis not present

## 2022-01-10 DIAGNOSIS — Z992 Dependence on renal dialysis: Secondary | ICD-10-CM | POA: Diagnosis not present

## 2022-01-10 DIAGNOSIS — D689 Coagulation defect, unspecified: Secondary | ICD-10-CM | POA: Diagnosis not present

## 2022-01-10 DIAGNOSIS — Z23 Encounter for immunization: Secondary | ICD-10-CM | POA: Diagnosis not present

## 2022-01-10 DIAGNOSIS — R52 Pain, unspecified: Secondary | ICD-10-CM | POA: Diagnosis not present

## 2022-01-11 ENCOUNTER — Emergency Department (HOSPITAL_COMMUNITY)
Admission: EM | Admit: 2022-01-11 | Discharge: 2022-01-12 | Discharge status: Against Medical Advice | Payer: Medicare Other | Attending: Emergency Medicine | Admitting: Emergency Medicine

## 2022-01-11 ENCOUNTER — Encounter (HOSPITAL_COMMUNITY): Payer: Self-pay

## 2022-01-11 ENCOUNTER — Other Ambulatory Visit: Payer: Self-pay

## 2022-01-11 DIAGNOSIS — R443 Hallucinations, unspecified: Secondary | ICD-10-CM | POA: Diagnosis present

## 2022-01-11 DIAGNOSIS — Z5321 Procedure and treatment not carried out due to patient leaving prior to being seen by health care provider: Secondary | ICD-10-CM | POA: Insufficient documentation

## 2022-01-11 LAB — BASIC METABOLIC PANEL
Anion gap: 15 (ref 5–15)
BUN: 46 mg/dL — ABNORMAL HIGH (ref 6–20)
CO2: 24 mmol/L (ref 22–32)
Calcium: 9.9 mg/dL (ref 8.9–10.3)
Chloride: 100 mmol/L (ref 98–111)
Creatinine, Ser: 13.99 mg/dL — ABNORMAL HIGH (ref 0.61–1.24)
GFR, Estimated: 4 mL/min — ABNORMAL LOW (ref >60)
Glucose, Bld: 140 mg/dL — ABNORMAL HIGH (ref 70–99)
Potassium: 4.8 mmol/L (ref 3.5–5.1)
Sodium: 139 mmol/L (ref 135–145)

## 2022-01-11 LAB — CBG MONITORING, ED: Glucose-Capillary: 144 mg/dL — ABNORMAL HIGH (ref 70–99)

## 2022-01-11 LAB — CBC
HCT: 49.1 % (ref 39.0–52.0)
Hemoglobin: 14.1 g/dL (ref 13.0–17.0)
MCH: 29.7 pg (ref 26.0–34.0)
MCHC: 28.7 g/dL — ABNORMAL LOW (ref 30.0–36.0)
MCV: 103.4 fL — ABNORMAL HIGH (ref 80.0–100.0)
Platelets: 180 10*3/uL (ref 150–400)
RBC: 4.75 MIL/uL (ref 4.22–5.81)
RDW: 15.5 % (ref 11.5–15.5)
WBC: 8.3 10*3/uL (ref 4.0–10.5)
nRBC: 0 % (ref 0.0–0.2)

## 2022-01-11 NOTE — ED Provider Triage Note (Signed)
Emergency Medicine Provider Triage Evaluation Note  Clancey Welton , a 46 y.o. male  was evaluated in triage.  Pt complains of hallucinations. States that 1 hour ago he took mushrooms from a stranger and has been hallucinating since. Denies any other symptoms including headache, dizziness, blurred vision, chest pain, or shortness of breath. Denies any other recreational drug use  Review of Systems  Positive:  Negative:   Physical Exam  BP (!) 159/115   Pulse (!) 122   Temp 98.5 F (36.9 C) (Oral)   Resp (!) 22   SpO2 90%  Gen:   Awake, no distress   Resp:  Normal effort  MSK:   Moves extremities without difficulty  Other:  Alert and oriented and neurologically intact without focal deficits  Medical Decision Making  Medically screening exam initiated at 9:08 PM.  Appropriate orders placed.  Josian Lanese was informed that the remainder of the evaluation will be completed by another provider, this initial triage assessment does not replace that evaluation, and the importance of remaining in the ED until their evaluation is complete.     Nestor Lewandowsky 01/11/22 2110

## 2022-01-11 NOTE — ED Triage Notes (Signed)
Pt states that at approx 2000 tonight he took 2 mushrooms from a stranger [place]. Pt c/o hallucinations, feeling hot, and not feeling himself. Pt denies HI and SI. Pt is a dialysis patient and last appointment was yesterday, states he has another one tomorrow.

## 2022-01-12 DIAGNOSIS — R110 Nausea: Secondary | ICD-10-CM | POA: Diagnosis not present

## 2022-01-12 DIAGNOSIS — D689 Coagulation defect, unspecified: Secondary | ICD-10-CM | POA: Diagnosis not present

## 2022-01-12 DIAGNOSIS — N186 End stage renal disease: Secondary | ICD-10-CM | POA: Diagnosis not present

## 2022-01-12 DIAGNOSIS — T782XXA Anaphylactic shock, unspecified, initial encounter: Secondary | ICD-10-CM | POA: Diagnosis not present

## 2022-01-12 DIAGNOSIS — L299 Pruritus, unspecified: Secondary | ICD-10-CM | POA: Diagnosis not present

## 2022-01-12 DIAGNOSIS — R209 Unspecified disturbances of skin sensation: Secondary | ICD-10-CM | POA: Diagnosis not present

## 2022-01-12 DIAGNOSIS — Z992 Dependence on renal dialysis: Secondary | ICD-10-CM | POA: Diagnosis not present

## 2022-01-12 DIAGNOSIS — Z23 Encounter for immunization: Secondary | ICD-10-CM | POA: Diagnosis not present

## 2022-01-12 DIAGNOSIS — N2581 Secondary hyperparathyroidism of renal origin: Secondary | ICD-10-CM | POA: Diagnosis not present

## 2022-01-12 DIAGNOSIS — D631 Anemia in chronic kidney disease: Secondary | ICD-10-CM | POA: Diagnosis not present

## 2022-01-12 DIAGNOSIS — R52 Pain, unspecified: Secondary | ICD-10-CM | POA: Diagnosis not present

## 2022-01-15 DIAGNOSIS — R110 Nausea: Secondary | ICD-10-CM | POA: Diagnosis not present

## 2022-01-15 DIAGNOSIS — T782XXA Anaphylactic shock, unspecified, initial encounter: Secondary | ICD-10-CM | POA: Diagnosis not present

## 2022-01-15 DIAGNOSIS — D631 Anemia in chronic kidney disease: Secondary | ICD-10-CM | POA: Diagnosis not present

## 2022-01-15 DIAGNOSIS — N186 End stage renal disease: Secondary | ICD-10-CM | POA: Diagnosis not present

## 2022-01-15 DIAGNOSIS — Z23 Encounter for immunization: Secondary | ICD-10-CM | POA: Diagnosis not present

## 2022-01-15 DIAGNOSIS — L299 Pruritus, unspecified: Secondary | ICD-10-CM | POA: Diagnosis not present

## 2022-01-15 DIAGNOSIS — R52 Pain, unspecified: Secondary | ICD-10-CM | POA: Diagnosis not present

## 2022-01-15 DIAGNOSIS — R209 Unspecified disturbances of skin sensation: Secondary | ICD-10-CM | POA: Diagnosis not present

## 2022-01-15 DIAGNOSIS — Z992 Dependence on renal dialysis: Secondary | ICD-10-CM | POA: Diagnosis not present

## 2022-01-15 DIAGNOSIS — N2581 Secondary hyperparathyroidism of renal origin: Secondary | ICD-10-CM | POA: Diagnosis not present

## 2022-01-15 DIAGNOSIS — D689 Coagulation defect, unspecified: Secondary | ICD-10-CM | POA: Diagnosis not present

## 2022-01-16 DIAGNOSIS — Z992 Dependence on renal dialysis: Secondary | ICD-10-CM | POA: Diagnosis not present

## 2022-01-16 DIAGNOSIS — N186 End stage renal disease: Secondary | ICD-10-CM | POA: Diagnosis not present

## 2022-01-17 DIAGNOSIS — R209 Unspecified disturbances of skin sensation: Secondary | ICD-10-CM | POA: Diagnosis not present

## 2022-01-17 DIAGNOSIS — D689 Coagulation defect, unspecified: Secondary | ICD-10-CM | POA: Diagnosis not present

## 2022-01-17 DIAGNOSIS — N2581 Secondary hyperparathyroidism of renal origin: Secondary | ICD-10-CM | POA: Diagnosis not present

## 2022-01-17 DIAGNOSIS — D631 Anemia in chronic kidney disease: Secondary | ICD-10-CM | POA: Diagnosis not present

## 2022-01-17 DIAGNOSIS — Z992 Dependence on renal dialysis: Secondary | ICD-10-CM | POA: Diagnosis not present

## 2022-01-17 DIAGNOSIS — N186 End stage renal disease: Secondary | ICD-10-CM | POA: Diagnosis not present

## 2022-01-17 DIAGNOSIS — R52 Pain, unspecified: Secondary | ICD-10-CM | POA: Diagnosis not present

## 2022-01-17 DIAGNOSIS — R110 Nausea: Secondary | ICD-10-CM | POA: Diagnosis not present

## 2022-01-17 DIAGNOSIS — L299 Pruritus, unspecified: Secondary | ICD-10-CM | POA: Diagnosis not present

## 2022-01-18 DIAGNOSIS — D689 Coagulation defect, unspecified: Secondary | ICD-10-CM | POA: Diagnosis not present

## 2022-01-18 DIAGNOSIS — N2581 Secondary hyperparathyroidism of renal origin: Secondary | ICD-10-CM | POA: Diagnosis not present

## 2022-01-18 DIAGNOSIS — D631 Anemia in chronic kidney disease: Secondary | ICD-10-CM | POA: Diagnosis not present

## 2022-01-18 DIAGNOSIS — L299 Pruritus, unspecified: Secondary | ICD-10-CM | POA: Diagnosis not present

## 2022-01-18 DIAGNOSIS — R110 Nausea: Secondary | ICD-10-CM | POA: Diagnosis not present

## 2022-01-18 DIAGNOSIS — R52 Pain, unspecified: Secondary | ICD-10-CM | POA: Diagnosis not present

## 2022-01-18 DIAGNOSIS — R209 Unspecified disturbances of skin sensation: Secondary | ICD-10-CM | POA: Diagnosis not present

## 2022-01-18 DIAGNOSIS — N186 End stage renal disease: Secondary | ICD-10-CM | POA: Diagnosis not present

## 2022-01-18 DIAGNOSIS — Z992 Dependence on renal dialysis: Secondary | ICD-10-CM | POA: Diagnosis not present

## 2022-01-22 DIAGNOSIS — L299 Pruritus, unspecified: Secondary | ICD-10-CM | POA: Diagnosis not present

## 2022-01-22 DIAGNOSIS — R52 Pain, unspecified: Secondary | ICD-10-CM | POA: Diagnosis not present

## 2022-01-22 DIAGNOSIS — D631 Anemia in chronic kidney disease: Secondary | ICD-10-CM | POA: Diagnosis not present

## 2022-01-22 DIAGNOSIS — Z992 Dependence on renal dialysis: Secondary | ICD-10-CM | POA: Diagnosis not present

## 2022-01-22 DIAGNOSIS — D689 Coagulation defect, unspecified: Secondary | ICD-10-CM | POA: Diagnosis not present

## 2022-01-22 DIAGNOSIS — N2581 Secondary hyperparathyroidism of renal origin: Secondary | ICD-10-CM | POA: Diagnosis not present

## 2022-01-22 DIAGNOSIS — N186 End stage renal disease: Secondary | ICD-10-CM | POA: Diagnosis not present

## 2022-01-22 DIAGNOSIS — R110 Nausea: Secondary | ICD-10-CM | POA: Diagnosis not present

## 2022-01-22 DIAGNOSIS — R209 Unspecified disturbances of skin sensation: Secondary | ICD-10-CM | POA: Diagnosis not present

## 2022-01-24 DIAGNOSIS — N186 End stage renal disease: Secondary | ICD-10-CM | POA: Diagnosis not present

## 2022-01-24 DIAGNOSIS — L299 Pruritus, unspecified: Secondary | ICD-10-CM | POA: Diagnosis not present

## 2022-01-24 DIAGNOSIS — R110 Nausea: Secondary | ICD-10-CM | POA: Diagnosis not present

## 2022-01-24 DIAGNOSIS — R209 Unspecified disturbances of skin sensation: Secondary | ICD-10-CM | POA: Diagnosis not present

## 2022-01-24 DIAGNOSIS — N2581 Secondary hyperparathyroidism of renal origin: Secondary | ICD-10-CM | POA: Diagnosis not present

## 2022-01-24 DIAGNOSIS — Z992 Dependence on renal dialysis: Secondary | ICD-10-CM | POA: Diagnosis not present

## 2022-01-24 DIAGNOSIS — D631 Anemia in chronic kidney disease: Secondary | ICD-10-CM | POA: Diagnosis not present

## 2022-01-24 DIAGNOSIS — R52 Pain, unspecified: Secondary | ICD-10-CM | POA: Diagnosis not present

## 2022-01-24 DIAGNOSIS — D689 Coagulation defect, unspecified: Secondary | ICD-10-CM | POA: Diagnosis not present

## 2022-01-26 DIAGNOSIS — Z992 Dependence on renal dialysis: Secondary | ICD-10-CM | POA: Diagnosis not present

## 2022-01-26 DIAGNOSIS — N186 End stage renal disease: Secondary | ICD-10-CM | POA: Diagnosis not present

## 2022-01-26 DIAGNOSIS — R209 Unspecified disturbances of skin sensation: Secondary | ICD-10-CM | POA: Diagnosis not present

## 2022-01-26 DIAGNOSIS — D689 Coagulation defect, unspecified: Secondary | ICD-10-CM | POA: Diagnosis not present

## 2022-01-26 DIAGNOSIS — R52 Pain, unspecified: Secondary | ICD-10-CM | POA: Diagnosis not present

## 2022-01-26 DIAGNOSIS — D631 Anemia in chronic kidney disease: Secondary | ICD-10-CM | POA: Diagnosis not present

## 2022-01-26 DIAGNOSIS — L299 Pruritus, unspecified: Secondary | ICD-10-CM | POA: Diagnosis not present

## 2022-01-26 DIAGNOSIS — N2581 Secondary hyperparathyroidism of renal origin: Secondary | ICD-10-CM | POA: Diagnosis not present

## 2022-01-26 DIAGNOSIS — R110 Nausea: Secondary | ICD-10-CM | POA: Diagnosis not present

## 2022-01-29 DIAGNOSIS — N186 End stage renal disease: Secondary | ICD-10-CM | POA: Diagnosis not present

## 2022-01-29 DIAGNOSIS — Z992 Dependence on renal dialysis: Secondary | ICD-10-CM | POA: Diagnosis not present

## 2022-01-29 DIAGNOSIS — R209 Unspecified disturbances of skin sensation: Secondary | ICD-10-CM | POA: Diagnosis not present

## 2022-01-29 DIAGNOSIS — R110 Nausea: Secondary | ICD-10-CM | POA: Diagnosis not present

## 2022-01-29 DIAGNOSIS — D689 Coagulation defect, unspecified: Secondary | ICD-10-CM | POA: Diagnosis not present

## 2022-01-29 DIAGNOSIS — L299 Pruritus, unspecified: Secondary | ICD-10-CM | POA: Diagnosis not present

## 2022-01-29 DIAGNOSIS — N2581 Secondary hyperparathyroidism of renal origin: Secondary | ICD-10-CM | POA: Diagnosis not present

## 2022-01-29 DIAGNOSIS — D631 Anemia in chronic kidney disease: Secondary | ICD-10-CM | POA: Diagnosis not present

## 2022-01-29 DIAGNOSIS — R52 Pain, unspecified: Secondary | ICD-10-CM | POA: Diagnosis not present

## 2022-01-31 DIAGNOSIS — N2581 Secondary hyperparathyroidism of renal origin: Secondary | ICD-10-CM | POA: Diagnosis not present

## 2022-01-31 DIAGNOSIS — R52 Pain, unspecified: Secondary | ICD-10-CM | POA: Diagnosis not present

## 2022-01-31 DIAGNOSIS — R110 Nausea: Secondary | ICD-10-CM | POA: Diagnosis not present

## 2022-01-31 DIAGNOSIS — Z992 Dependence on renal dialysis: Secondary | ICD-10-CM | POA: Diagnosis not present

## 2022-01-31 DIAGNOSIS — L299 Pruritus, unspecified: Secondary | ICD-10-CM | POA: Diagnosis not present

## 2022-01-31 DIAGNOSIS — N186 End stage renal disease: Secondary | ICD-10-CM | POA: Diagnosis not present

## 2022-01-31 DIAGNOSIS — D689 Coagulation defect, unspecified: Secondary | ICD-10-CM | POA: Diagnosis not present

## 2022-01-31 DIAGNOSIS — R209 Unspecified disturbances of skin sensation: Secondary | ICD-10-CM | POA: Diagnosis not present

## 2022-01-31 DIAGNOSIS — D631 Anemia in chronic kidney disease: Secondary | ICD-10-CM | POA: Diagnosis not present

## 2022-02-02 DIAGNOSIS — L299 Pruritus, unspecified: Secondary | ICD-10-CM | POA: Diagnosis not present

## 2022-02-02 DIAGNOSIS — R209 Unspecified disturbances of skin sensation: Secondary | ICD-10-CM | POA: Diagnosis not present

## 2022-02-02 DIAGNOSIS — R52 Pain, unspecified: Secondary | ICD-10-CM | POA: Diagnosis not present

## 2022-02-02 DIAGNOSIS — D631 Anemia in chronic kidney disease: Secondary | ICD-10-CM | POA: Diagnosis not present

## 2022-02-02 DIAGNOSIS — R110 Nausea: Secondary | ICD-10-CM | POA: Diagnosis not present

## 2022-02-02 DIAGNOSIS — D689 Coagulation defect, unspecified: Secondary | ICD-10-CM | POA: Diagnosis not present

## 2022-02-02 DIAGNOSIS — N2581 Secondary hyperparathyroidism of renal origin: Secondary | ICD-10-CM | POA: Diagnosis not present

## 2022-02-02 DIAGNOSIS — Z992 Dependence on renal dialysis: Secondary | ICD-10-CM | POA: Diagnosis not present

## 2022-02-02 DIAGNOSIS — N186 End stage renal disease: Secondary | ICD-10-CM | POA: Diagnosis not present

## 2022-02-06 DIAGNOSIS — R52 Pain, unspecified: Secondary | ICD-10-CM | POA: Diagnosis not present

## 2022-02-06 DIAGNOSIS — R209 Unspecified disturbances of skin sensation: Secondary | ICD-10-CM | POA: Diagnosis not present

## 2022-02-06 DIAGNOSIS — R110 Nausea: Secondary | ICD-10-CM | POA: Diagnosis not present

## 2022-02-06 DIAGNOSIS — G4733 Obstructive sleep apnea (adult) (pediatric): Secondary | ICD-10-CM | POA: Diagnosis not present

## 2022-02-06 DIAGNOSIS — D689 Coagulation defect, unspecified: Secondary | ICD-10-CM | POA: Diagnosis not present

## 2022-02-06 DIAGNOSIS — N186 End stage renal disease: Secondary | ICD-10-CM | POA: Diagnosis not present

## 2022-02-06 DIAGNOSIS — L299 Pruritus, unspecified: Secondary | ICD-10-CM | POA: Diagnosis not present

## 2022-02-06 DIAGNOSIS — N2581 Secondary hyperparathyroidism of renal origin: Secondary | ICD-10-CM | POA: Diagnosis not present

## 2022-02-06 DIAGNOSIS — Z992 Dependence on renal dialysis: Secondary | ICD-10-CM | POA: Diagnosis not present

## 2022-02-06 DIAGNOSIS — D631 Anemia in chronic kidney disease: Secondary | ICD-10-CM | POA: Diagnosis not present

## 2022-02-10 DIAGNOSIS — Z992 Dependence on renal dialysis: Secondary | ICD-10-CM | POA: Diagnosis not present

## 2022-02-10 DIAGNOSIS — D689 Coagulation defect, unspecified: Secondary | ICD-10-CM | POA: Diagnosis not present

## 2022-02-10 DIAGNOSIS — L299 Pruritus, unspecified: Secondary | ICD-10-CM | POA: Diagnosis not present

## 2022-02-10 DIAGNOSIS — R110 Nausea: Secondary | ICD-10-CM | POA: Diagnosis not present

## 2022-02-10 DIAGNOSIS — N186 End stage renal disease: Secondary | ICD-10-CM | POA: Diagnosis not present

## 2022-02-10 DIAGNOSIS — D631 Anemia in chronic kidney disease: Secondary | ICD-10-CM | POA: Diagnosis not present

## 2022-02-10 DIAGNOSIS — R209 Unspecified disturbances of skin sensation: Secondary | ICD-10-CM | POA: Diagnosis not present

## 2022-02-10 DIAGNOSIS — R52 Pain, unspecified: Secondary | ICD-10-CM | POA: Diagnosis not present

## 2022-02-10 DIAGNOSIS — N2581 Secondary hyperparathyroidism of renal origin: Secondary | ICD-10-CM | POA: Diagnosis not present

## 2022-02-12 DIAGNOSIS — D689 Coagulation defect, unspecified: Secondary | ICD-10-CM | POA: Diagnosis not present

## 2022-02-12 DIAGNOSIS — N186 End stage renal disease: Secondary | ICD-10-CM | POA: Diagnosis not present

## 2022-02-12 DIAGNOSIS — Z992 Dependence on renal dialysis: Secondary | ICD-10-CM | POA: Diagnosis not present

## 2022-02-12 DIAGNOSIS — R52 Pain, unspecified: Secondary | ICD-10-CM | POA: Diagnosis not present

## 2022-02-12 DIAGNOSIS — L299 Pruritus, unspecified: Secondary | ICD-10-CM | POA: Diagnosis not present

## 2022-02-12 DIAGNOSIS — D631 Anemia in chronic kidney disease: Secondary | ICD-10-CM | POA: Diagnosis not present

## 2022-02-12 DIAGNOSIS — N2581 Secondary hyperparathyroidism of renal origin: Secondary | ICD-10-CM | POA: Diagnosis not present

## 2022-02-12 DIAGNOSIS — R209 Unspecified disturbances of skin sensation: Secondary | ICD-10-CM | POA: Diagnosis not present

## 2022-02-12 DIAGNOSIS — R110 Nausea: Secondary | ICD-10-CM | POA: Diagnosis not present

## 2022-02-14 DIAGNOSIS — R110 Nausea: Secondary | ICD-10-CM | POA: Diagnosis not present

## 2022-02-14 DIAGNOSIS — N186 End stage renal disease: Secondary | ICD-10-CM | POA: Diagnosis not present

## 2022-02-14 DIAGNOSIS — L299 Pruritus, unspecified: Secondary | ICD-10-CM | POA: Diagnosis not present

## 2022-02-14 DIAGNOSIS — N2581 Secondary hyperparathyroidism of renal origin: Secondary | ICD-10-CM | POA: Diagnosis not present

## 2022-02-14 DIAGNOSIS — R52 Pain, unspecified: Secondary | ICD-10-CM | POA: Diagnosis not present

## 2022-02-14 DIAGNOSIS — D689 Coagulation defect, unspecified: Secondary | ICD-10-CM | POA: Diagnosis not present

## 2022-02-14 DIAGNOSIS — Z992 Dependence on renal dialysis: Secondary | ICD-10-CM | POA: Diagnosis not present

## 2022-02-14 DIAGNOSIS — D631 Anemia in chronic kidney disease: Secondary | ICD-10-CM | POA: Diagnosis not present

## 2022-02-14 DIAGNOSIS — R209 Unspecified disturbances of skin sensation: Secondary | ICD-10-CM | POA: Diagnosis not present

## 2022-02-15 DIAGNOSIS — Z992 Dependence on renal dialysis: Secondary | ICD-10-CM | POA: Diagnosis not present

## 2022-02-15 DIAGNOSIS — N186 End stage renal disease: Secondary | ICD-10-CM | POA: Diagnosis not present

## 2022-02-16 DIAGNOSIS — L299 Pruritus, unspecified: Secondary | ICD-10-CM | POA: Diagnosis not present

## 2022-02-16 DIAGNOSIS — D689 Coagulation defect, unspecified: Secondary | ICD-10-CM | POA: Diagnosis not present

## 2022-02-16 DIAGNOSIS — N2581 Secondary hyperparathyroidism of renal origin: Secondary | ICD-10-CM | POA: Diagnosis not present

## 2022-02-16 DIAGNOSIS — R209 Unspecified disturbances of skin sensation: Secondary | ICD-10-CM | POA: Diagnosis not present

## 2022-02-16 DIAGNOSIS — D631 Anemia in chronic kidney disease: Secondary | ICD-10-CM | POA: Diagnosis not present

## 2022-02-16 DIAGNOSIS — R110 Nausea: Secondary | ICD-10-CM | POA: Diagnosis not present

## 2022-02-16 DIAGNOSIS — Z992 Dependence on renal dialysis: Secondary | ICD-10-CM | POA: Diagnosis not present

## 2022-02-16 DIAGNOSIS — N186 End stage renal disease: Secondary | ICD-10-CM | POA: Diagnosis not present

## 2022-02-16 DIAGNOSIS — R52 Pain, unspecified: Secondary | ICD-10-CM | POA: Diagnosis not present

## 2022-02-19 DIAGNOSIS — D689 Coagulation defect, unspecified: Secondary | ICD-10-CM | POA: Diagnosis not present

## 2022-02-19 DIAGNOSIS — N186 End stage renal disease: Secondary | ICD-10-CM | POA: Diagnosis not present

## 2022-02-19 DIAGNOSIS — R52 Pain, unspecified: Secondary | ICD-10-CM | POA: Diagnosis not present

## 2022-02-19 DIAGNOSIS — N2581 Secondary hyperparathyroidism of renal origin: Secondary | ICD-10-CM | POA: Diagnosis not present

## 2022-02-19 DIAGNOSIS — R110 Nausea: Secondary | ICD-10-CM | POA: Diagnosis not present

## 2022-02-19 DIAGNOSIS — Z992 Dependence on renal dialysis: Secondary | ICD-10-CM | POA: Diagnosis not present

## 2022-02-19 DIAGNOSIS — R209 Unspecified disturbances of skin sensation: Secondary | ICD-10-CM | POA: Diagnosis not present

## 2022-02-19 DIAGNOSIS — L299 Pruritus, unspecified: Secondary | ICD-10-CM | POA: Diagnosis not present

## 2022-02-19 DIAGNOSIS — D631 Anemia in chronic kidney disease: Secondary | ICD-10-CM | POA: Diagnosis not present

## 2022-02-20 ENCOUNTER — Ambulatory Visit (INDEPENDENT_AMBULATORY_CARE_PROVIDER_SITE_OTHER): Payer: Medicare Other | Admitting: Pulmonary Disease

## 2022-02-20 ENCOUNTER — Encounter: Payer: Self-pay | Admitting: Pulmonary Disease

## 2022-02-20 VITALS — BP 108/86 | HR 111 | Temp 97.9°F | Ht 74.0 in | Wt 256.8 lb

## 2022-02-20 DIAGNOSIS — G4733 Obstructive sleep apnea (adult) (pediatric): Secondary | ICD-10-CM

## 2022-02-20 MED ORDER — ESZOPICLONE 2 MG PO TABS: 2.0000 mg | ORAL_TABLET | Freq: Every evening | ORAL | 2 refills | Status: DC | PRN

## 2022-02-20 NOTE — Progress Notes (Signed)
Michael [name]    [id number]    08-Sep-1975  Primary Care Physician:Pcp, No  Referring Physician: No referring provider defined for this encounter.  Chief complaint:   Nonrestorative sleep Early awakening  HPI:  Patient with known mild obstructive sleep apnea on CPAP therapy  On auto CPAP 5-20 Current machine is about a year to a year and a half old  Was having great success with CPAP previously but lately has been waking up very early in the morning about 4 AM  he does have some difficulty falling asleep and difficulty going back to sleep once he wakes up  Obstructive sleep apnea was diagnosed in 2019 Started on auto CPAP  Has no difficulty tolerating CPAP Recently had a sinus infection Has had 2 machines overall  Usually tries to go to bed about 11:00, falls asleep sometimes quickly and sometimes takes him an hour to 2 hours Multiple awakenings at least 2 or 3 Final get up time is about 6 AM  Weight is down from when he had a sleep study by about 3035 pounds  History of hypertension, end-stage renal disease on hemodialysis-this is secondary to polycystic kidneys Wakes up with a dry mouth in the mornings No morning headaches Over-the-counter sleep aids have not helped previously    Outpatient Encounter Medications as of 02/20/2022  Medication Sig   calcium acetate (PHOSLO) 667 MG capsule Take 1,334-2,001 mg by mouth See admin instructions. Take 1334 to 2001 mg with each meal (based on the size)   ibuprofen (ADVIL) 200 MG tablet Take 600 mg by mouth every 6 (six) hours as needed for moderate pain.   lidocaine (XYLOCAINE) 2 % solution Use as directed 10 mLs in the mouth or throat every 6 (six) hours as needed for mouth pain.   polyethylene glycol (MIRALAX) 17 g packet Take 17 g by mouth daily. (Patient taking differently: Take 17 g by mouth daily as needed (constipation.).)   PRESCRIPTION MEDICATION Inhale into the lungs at bedtime. CPAP    amoxicillin-clavulanate (AUGMENTIN) 875-125 MG tablet Take 1 tablet by mouth every 12 (twelve) hours. (Patient not taking: Reported on 02/20/2022)   cyclobenzaprine (FLEXERIL) 10 MG tablet Take 1 tablet (10 mg total) by mouth 2 (two) times daily as needed for muscle spasms. (Patient not taking: Reported on 02/20/2022)   oxyCODONE (OXY IR/ROXICODONE) 5 MG immediate release tablet Take 1 tablet (5 mg total) by mouth every 8 (eight) hours as needed for severe pain. (Patient not taking: Reported on 02/20/2022)   oxyCODONE (ROXICODONE) 5 MG immediate release tablet Take 1 tablet (5 mg total) by mouth every 8 (eight) hours as needed. (Patient not taking: Reported on 10/13/2021)   oxyCODONE-acetaminophen (PERCOCET) 10-325 MG tablet Take 1 tablet by mouth every 6 (six) hours as needed for pain. (Patient not taking: Reported on 10/13/2021)   [DISCONTINUED] isosorbide dinitrate (ISORDIL) 10 MG tablet Take 1 tablet (10 mg total) by mouth 2 (two) times daily. (Patient not taking: No sig reported)   No facility-administered encounter medications on file as of 02/20/2022.    Allergies as of 02/20/2022 - Review Complete 02/20/2022  Allergen Reaction Noted   Ivp dye [iodinated contrast media] Itching 05/29/2021    Past Medical History:  Diagnosis Date   GERD (gastroesophageal reflux disease)    Gout    History of renal dialysis    M-W-F   HLD (hyperlipidemia)    Hypertension    Intracranial aneurysm    s/p coil  embolization: right middle meningeal artery 09/08/20, pericallosal artery 09/22/20, right MCA 12/22/20   Pneumonia    Polycystic kidney disease    SDH (subdural hematoma) ([facility])    subacute right SDH 08/23/20 CTA head   Sleep apnea 07/29/2018   uses cpap nightly   Vitamin D deficiency 11/2018    Past Surgical History:  Procedure Laterality Date   AV FISTULA PLACEMENT Left 03/24/2020   Procedure: INSERTION OF LEFT UPPER EXTREMITY ARTERIOVENOUS (AV) GORE-TEX GRAFT;  Surgeon: [name], MD;   Location: [facility];  Service: Vascular;  Laterality: Left;  PERIPHERAL NERVE BLOCK   [place] Left 12/11/2019   Procedure: 1ST STAGE BASILIC TRANSPOSITION OF LEFT ARM;  Surgeon: Angelia Mould, MD;  Location: [facility];  Service: Vascular;  Laterality: Left;   BIOPSY  04/06/2021   Procedure: BIOPSY;  Surgeon: Thornton Park, MD;  Location: [facility];  Service: Gastroenterology;;   COLONOSCOPY WITH PROPOFOL N/A 04/06/2021   Procedure: COLONOSCOPY WITH PROPOFOL;  Surgeon: Thornton Park, MD;  Location: [facility];  Service: Gastroenterology;  Laterality: N/A;   COLONOSCOPY WITH PROPOFOL N/A 05/29/2021   Procedure: COLONOSCOPY WITH PROPOFOL;  Surgeon: Rush Landmark Telford Nab., MD;  Location: [facility];  Service: Gastroenterology;  Laterality: N/A;   DG AV DIALYSIS GRAFT DECLOT OR Left 05/17/2020   ENDOSCOPIC MUCOSAL RESECTION  05/29/2021   Procedure: ENDOSCOPIC MUCOSAL RESECTION;  Surgeon: Rush Landmark Telford Nab., MD;  Location: [facility];  Service: Gastroenterology;;   HEMOSTASIS CLIP PLACEMENT  05/29/2021   Procedure: HEMOSTASIS CLIP PLACEMENT;  Surgeon: Irving Copas., MD;  Location: [facility];  Service: Gastroenterology;;   IR FLUORO GUIDE CV LINE RIGHT  12/09/2019   IR US GUIDE VASC ACCESS RIGHT  12/09/2019   LEFT HEART CATH AND CORONARY ANGIOGRAPHY N/A 12/09/2019   Procedure: LEFT HEART CATH AND CORONARY ANGIOGRAPHY;  Surgeon: Adrian Prows, MD;  Location: [facility];  Service: Cardiovascular;  Laterality: N/A;   POLYPECTOMY  04/06/2021   Procedure: POLYPECTOMY;  Surgeon: Thornton Park, MD;  Location: [facility];  Service: Gastroenterology;;   POLYPECTOMY  05/29/2021   Procedure: POLYPECTOMY;  Surgeon: Irving Copas., MD;  Location: [facility];  Service: Gastroenterology;;   REVISION OF ARTERIOVENOUS GORETEX GRAFT Left 08/31/2021   Procedure: REDO LEFT UPPER ARM ARTERIOVENOUS GORETEX GRAFT;  Surgeon: Broadus John, MD;   Location: [facility];  Service: Vascular;  Laterality: Left;  [place] INJECTION  05/29/2021   Procedure: SUBMUCOSAL INJECTION;  Surgeon: Irving Copas., MD;  Location: [facility];  Service: Gastroenterology;;  EPI   SUBMUCOSAL LIFTING INJECTION  05/29/2021   Procedure: SUBMUCOSAL LIFTING INJECTION;  Surgeon: Irving Copas., MD;  Location: [facility];  Service: Gastroenterology;;  [place] INJECTION  04/06/2021   Procedure: SUBMUCOSAL TATTOO INJECTION;  Surgeon: Thornton Park, MD;  Location: [facility];  Service: Gastroenterology;;   UPPER EXTREMITY VENOGRAPHY Bilateral 08/24/2021   Procedure: UPPER EXTREMITY VENOGRAPHY;  Surgeon: Marty Heck, MD;  Location: [facility];  Service: Cardiovascular;  Laterality: Bilateral;    Family History  Problem Relation Age of Onset   Diabetes Mother    Ovarian cancer Mother    Bipolar disorder Father    Hypertension Father    Prostate cancer Father    Prostate cancer Paternal Grandfather    Polycystic kidney disease Neg Hx     Social History   Socioeconomic History   Marital status: Single    Spouse name: Not on  file   Number of children: 1   Years of education: Not on file   Highest education level: Not on file  Occupational History   Occupation: [employer]   Occupation: disabled  Tobacco Use   Smoking status: Former    Packs/day: 0.20    Years: 10.00    Total pack years: 2.00    Types: Cigarettes    Quit date: 2015    Years since quitting: 8.9   Smokeless tobacco: Never  Vaping Use   Vaping Use: Never used  Substance and Sexual Activity   Alcohol use: Not Currently    Comment: occasional wine   Drug use: No   Sexual activity: Yes  Other Topics Concern   Not on file  Social History Narrative   Not on file   Social Determinants of Health   Financial Resource Strain: High Risk (08/30/2020)   Overall Financial Resource Strain (CARDIA)     Difficulty of Paying Living Expenses: Very hard  Food Insecurity: Food Insecurity Present (10/19/2021)   Hunger Vital Sign    Worried About [place] in the Last Year: Often true    [place] in the Last Year: Often true  Transportation Needs: No Transportation Needs (10/19/2021)   PRAPARE - Hydrologist (Medical): No    Lack of Transportation (Non-Medical): No  Physical Activity: Insufficiently Active (08/30/2020)   Exercise Vital Sign    Days of Exercise per Week: 3 days    Minutes of Exercise per Session: 30 min  Stress: Stress Concern Present (08/30/2020)   [place]    Feeling of Stress : To some extent  Social Connections: Moderately Integrated (08/30/2020)   Social Connection and Isolation Panel [NHANES]    Frequency of Communication with Friends and Family: More than three times a week    Frequency of Social Gatherings with Friends and Family: Once a week    Attends Religious Services: 1 to 4 times per year    Active Member of [employer] or Organizations: Yes    Attends Archivist Meetings: 1 to 4 times per year    Marital Status: Never married  Intimate Partner Violence: Not At Risk (08/30/2020)   Humiliation, Afraid, Rape, and Kick questionnaire    Fear of Current or Ex-Partner: No    Emotionally Abused: No    Physically Abused: No    Sexually Abused: No    Review of Systems  Respiratory:  Positive for apnea.   Psychiatric/Behavioral:  Positive for sleep disturbance.     Vitals:   02/20/22 0922  BP: 108/86  Pulse: (!) 111  Temp: 97.9 F (36.6 C)  SpO2: 96%     Physical Exam Constitutional:      Appearance: He is obese.  HENT:     Head: Normocephalic and atraumatic.     Mouth/Throat:     Mouth: Mucous membranes are moist.  Eyes:     Pupils: Pupils are equal, round, and reactive to light.  Cardiovascular:     Rate and Rhythm: Normal rate and  regular rhythm.  Pulmonary:     Effort: No respiratory distress.     Breath sounds: No stridor. No wheezing or rhonchi.  Musculoskeletal:     Cervical back: No rigidity or tenderness.  Neurological:     Mental Status: He is alert.  Psychiatric:        Mood and Affect: Mood normal.  02/20/2022    9:00 AM  Results of the [place] flowsheet  Sitting and reading 1  Watching TV 1  Sitting, inactive in a public place (e.g. a theatre or a meeting) 0  As a passenger in a car for an hour without a break 0  Lying down to rest in the afternoon when circumstances permit 1  Sitting and talking to someone 0  Sitting quietly after a lunch without alcohol 0  In a car, while stopped for a few minutes in traffic 0  Total score 3     Data Reviewed: Sleep study from 2019 did reveal mild obstructive sleep apneNo smartcarda  Smartcard machine is not working well  [employer], do do not have a download on him, has to take his machine in to be downloaded  Assessment:  Obstructive sleep apnea  Nonrestorative sleep  Insomnia both sleep onset and sleep maintenance insomnia  End-stage renal disease on hemodialysis  Plan/Recommendations:  Trial with Lunesta for sleep onset and sleep maintenance insomnia  2 mg Lunesta sent to pharmacy  May need to escalate dose if not effective  Other agents may be tried if not effective  If still having significant difficulty with his sleep despite optimizing sleep aid, may need a repeat sleep study  Encouraged to continue using CPAP nightly  Tentative follow-up in 2 to 3 months   Sherrilyn Rist MD [facility] 02/20/2022, 9:35 AM  CC: No ref. provider found

## 2022-02-20 NOTE — Patient Instructions (Signed)
Continue using his CPAP machine on a nightly basis  Prescription for Lunesta sent into pharmacy for you  2 mg to be used probably about 30 minutes prior to bedtime  Allow yourself about 6 to 8 hours of sleep to avoid residual sleepiness in the morning  Dose can be increased to 3 mg if 2 mg not effective enough  We can consider an alternative medication if Lunesta does not work well for you  Follow-up in about 2 to 3 months  If still having symptoms/problems despite sleep aids then we will need to reconsider a sleep study

## 2022-02-21 DIAGNOSIS — D689 Coagulation defect, unspecified: Secondary | ICD-10-CM | POA: Diagnosis not present

## 2022-02-21 DIAGNOSIS — N2581 Secondary hyperparathyroidism of renal origin: Secondary | ICD-10-CM | POA: Diagnosis not present

## 2022-02-21 DIAGNOSIS — L299 Pruritus, unspecified: Secondary | ICD-10-CM | POA: Diagnosis not present

## 2022-02-21 DIAGNOSIS — D631 Anemia in chronic kidney disease: Secondary | ICD-10-CM | POA: Diagnosis not present

## 2022-02-21 DIAGNOSIS — R110 Nausea: Secondary | ICD-10-CM | POA: Diagnosis not present

## 2022-02-21 DIAGNOSIS — R52 Pain, unspecified: Secondary | ICD-10-CM | POA: Diagnosis not present

## 2022-02-21 DIAGNOSIS — N186 End stage renal disease: Secondary | ICD-10-CM | POA: Diagnosis not present

## 2022-02-21 DIAGNOSIS — R209 Unspecified disturbances of skin sensation: Secondary | ICD-10-CM | POA: Diagnosis not present

## 2022-02-21 DIAGNOSIS — Z992 Dependence on renal dialysis: Secondary | ICD-10-CM | POA: Diagnosis not present

## 2022-02-23 DIAGNOSIS — D689 Coagulation defect, unspecified: Secondary | ICD-10-CM | POA: Diagnosis not present

## 2022-02-23 DIAGNOSIS — R209 Unspecified disturbances of skin sensation: Secondary | ICD-10-CM | POA: Diagnosis not present

## 2022-02-23 DIAGNOSIS — R110 Nausea: Secondary | ICD-10-CM | POA: Diagnosis not present

## 2022-02-23 DIAGNOSIS — N2581 Secondary hyperparathyroidism of renal origin: Secondary | ICD-10-CM | POA: Diagnosis not present

## 2022-02-23 DIAGNOSIS — L299 Pruritus, unspecified: Secondary | ICD-10-CM | POA: Diagnosis not present

## 2022-02-23 DIAGNOSIS — D631 Anemia in chronic kidney disease: Secondary | ICD-10-CM | POA: Diagnosis not present

## 2022-02-23 DIAGNOSIS — Z992 Dependence on renal dialysis: Secondary | ICD-10-CM | POA: Diagnosis not present

## 2022-02-23 DIAGNOSIS — R52 Pain, unspecified: Secondary | ICD-10-CM | POA: Diagnosis not present

## 2022-02-23 DIAGNOSIS — N186 End stage renal disease: Secondary | ICD-10-CM | POA: Diagnosis not present

## 2022-02-26 DIAGNOSIS — N2581 Secondary hyperparathyroidism of renal origin: Secondary | ICD-10-CM | POA: Diagnosis not present

## 2022-02-26 DIAGNOSIS — R110 Nausea: Secondary | ICD-10-CM | POA: Diagnosis not present

## 2022-02-26 DIAGNOSIS — L299 Pruritus, unspecified: Secondary | ICD-10-CM | POA: Diagnosis not present

## 2022-02-26 DIAGNOSIS — R52 Pain, unspecified: Secondary | ICD-10-CM | POA: Diagnosis not present

## 2022-02-26 DIAGNOSIS — Z992 Dependence on renal dialysis: Secondary | ICD-10-CM | POA: Diagnosis not present

## 2022-02-26 DIAGNOSIS — R209 Unspecified disturbances of skin sensation: Secondary | ICD-10-CM | POA: Diagnosis not present

## 2022-02-26 DIAGNOSIS — N186 End stage renal disease: Secondary | ICD-10-CM | POA: Diagnosis not present

## 2022-02-26 DIAGNOSIS — D689 Coagulation defect, unspecified: Secondary | ICD-10-CM | POA: Diagnosis not present

## 2022-02-26 DIAGNOSIS — D631 Anemia in chronic kidney disease: Secondary | ICD-10-CM | POA: Diagnosis not present

## 2022-02-28 DIAGNOSIS — L299 Pruritus, unspecified: Secondary | ICD-10-CM | POA: Diagnosis not present

## 2022-02-28 DIAGNOSIS — D689 Coagulation defect, unspecified: Secondary | ICD-10-CM | POA: Diagnosis not present

## 2022-02-28 DIAGNOSIS — N186 End stage renal disease: Secondary | ICD-10-CM | POA: Diagnosis not present

## 2022-02-28 DIAGNOSIS — N2581 Secondary hyperparathyroidism of renal origin: Secondary | ICD-10-CM | POA: Diagnosis not present

## 2022-02-28 DIAGNOSIS — R209 Unspecified disturbances of skin sensation: Secondary | ICD-10-CM | POA: Diagnosis not present

## 2022-02-28 DIAGNOSIS — R110 Nausea: Secondary | ICD-10-CM | POA: Diagnosis not present

## 2022-02-28 DIAGNOSIS — R52 Pain, unspecified: Secondary | ICD-10-CM | POA: Diagnosis not present

## 2022-02-28 DIAGNOSIS — Z992 Dependence on renal dialysis: Secondary | ICD-10-CM | POA: Diagnosis not present

## 2022-02-28 DIAGNOSIS — D631 Anemia in chronic kidney disease: Secondary | ICD-10-CM | POA: Diagnosis not present

## 2022-03-02 DIAGNOSIS — R209 Unspecified disturbances of skin sensation: Secondary | ICD-10-CM | POA: Diagnosis not present

## 2022-03-02 DIAGNOSIS — N2581 Secondary hyperparathyroidism of renal origin: Secondary | ICD-10-CM | POA: Diagnosis not present

## 2022-03-02 DIAGNOSIS — Z992 Dependence on renal dialysis: Secondary | ICD-10-CM | POA: Diagnosis not present

## 2022-03-02 DIAGNOSIS — D689 Coagulation defect, unspecified: Secondary | ICD-10-CM | POA: Diagnosis not present

## 2022-03-02 DIAGNOSIS — R52 Pain, unspecified: Secondary | ICD-10-CM | POA: Diagnosis not present

## 2022-03-02 DIAGNOSIS — L299 Pruritus, unspecified: Secondary | ICD-10-CM | POA: Diagnosis not present

## 2022-03-02 DIAGNOSIS — R110 Nausea: Secondary | ICD-10-CM | POA: Diagnosis not present

## 2022-03-02 DIAGNOSIS — N186 End stage renal disease: Secondary | ICD-10-CM | POA: Diagnosis not present

## 2022-03-02 DIAGNOSIS — D631 Anemia in chronic kidney disease: Secondary | ICD-10-CM | POA: Diagnosis not present

## 2022-03-05 DIAGNOSIS — R209 Unspecified disturbances of skin sensation: Secondary | ICD-10-CM | POA: Diagnosis not present

## 2022-03-05 DIAGNOSIS — R110 Nausea: Secondary | ICD-10-CM | POA: Diagnosis not present

## 2022-03-05 DIAGNOSIS — Z992 Dependence on renal dialysis: Secondary | ICD-10-CM | POA: Diagnosis not present

## 2022-03-05 DIAGNOSIS — N186 End stage renal disease: Secondary | ICD-10-CM | POA: Diagnosis not present

## 2022-03-05 DIAGNOSIS — D631 Anemia in chronic kidney disease: Secondary | ICD-10-CM | POA: Diagnosis not present

## 2022-03-05 DIAGNOSIS — D689 Coagulation defect, unspecified: Secondary | ICD-10-CM | POA: Diagnosis not present

## 2022-03-05 DIAGNOSIS — R52 Pain, unspecified: Secondary | ICD-10-CM | POA: Diagnosis not present

## 2022-03-05 DIAGNOSIS — N2581 Secondary hyperparathyroidism of renal origin: Secondary | ICD-10-CM | POA: Diagnosis not present

## 2022-03-05 DIAGNOSIS — L299 Pruritus, unspecified: Secondary | ICD-10-CM | POA: Diagnosis not present

## 2022-03-07 DIAGNOSIS — D689 Coagulation defect, unspecified: Secondary | ICD-10-CM | POA: Diagnosis not present

## 2022-03-07 DIAGNOSIS — N2581 Secondary hyperparathyroidism of renal origin: Secondary | ICD-10-CM | POA: Diagnosis not present

## 2022-03-07 DIAGNOSIS — Z992 Dependence on renal dialysis: Secondary | ICD-10-CM | POA: Diagnosis not present

## 2022-03-07 DIAGNOSIS — D631 Anemia in chronic kidney disease: Secondary | ICD-10-CM | POA: Diagnosis not present

## 2022-03-07 DIAGNOSIS — R209 Unspecified disturbances of skin sensation: Secondary | ICD-10-CM | POA: Diagnosis not present

## 2022-03-07 DIAGNOSIS — N186 End stage renal disease: Secondary | ICD-10-CM | POA: Diagnosis not present

## 2022-03-07 DIAGNOSIS — R52 Pain, unspecified: Secondary | ICD-10-CM | POA: Diagnosis not present

## 2022-03-07 DIAGNOSIS — L299 Pruritus, unspecified: Secondary | ICD-10-CM | POA: Diagnosis not present

## 2022-03-07 DIAGNOSIS — R110 Nausea: Secondary | ICD-10-CM | POA: Diagnosis not present

## 2022-03-09 DIAGNOSIS — D631 Anemia in chronic kidney disease: Secondary | ICD-10-CM | POA: Diagnosis not present

## 2022-03-09 DIAGNOSIS — L299 Pruritus, unspecified: Secondary | ICD-10-CM | POA: Diagnosis not present

## 2022-03-09 DIAGNOSIS — N2581 Secondary hyperparathyroidism of renal origin: Secondary | ICD-10-CM | POA: Diagnosis not present

## 2022-03-09 DIAGNOSIS — R110 Nausea: Secondary | ICD-10-CM | POA: Diagnosis not present

## 2022-03-09 DIAGNOSIS — R52 Pain, unspecified: Secondary | ICD-10-CM | POA: Diagnosis not present

## 2022-03-09 DIAGNOSIS — N186 End stage renal disease: Secondary | ICD-10-CM | POA: Diagnosis not present

## 2022-03-09 DIAGNOSIS — D689 Coagulation defect, unspecified: Secondary | ICD-10-CM | POA: Diagnosis not present

## 2022-03-09 DIAGNOSIS — Z992 Dependence on renal dialysis: Secondary | ICD-10-CM | POA: Diagnosis not present

## 2022-03-09 DIAGNOSIS — R209 Unspecified disturbances of skin sensation: Secondary | ICD-10-CM | POA: Diagnosis not present

## 2022-03-11 DIAGNOSIS — L299 Pruritus, unspecified: Secondary | ICD-10-CM | POA: Diagnosis not present

## 2022-03-11 DIAGNOSIS — R110 Nausea: Secondary | ICD-10-CM | POA: Diagnosis not present

## 2022-03-11 DIAGNOSIS — D689 Coagulation defect, unspecified: Secondary | ICD-10-CM | POA: Diagnosis not present

## 2022-03-11 DIAGNOSIS — R209 Unspecified disturbances of skin sensation: Secondary | ICD-10-CM | POA: Diagnosis not present

## 2022-03-11 DIAGNOSIS — N186 End stage renal disease: Secondary | ICD-10-CM | POA: Diagnosis not present

## 2022-03-11 DIAGNOSIS — R52 Pain, unspecified: Secondary | ICD-10-CM | POA: Diagnosis not present

## 2022-03-11 DIAGNOSIS — D631 Anemia in chronic kidney disease: Secondary | ICD-10-CM | POA: Diagnosis not present

## 2022-03-11 DIAGNOSIS — N2581 Secondary hyperparathyroidism of renal origin: Secondary | ICD-10-CM | POA: Diagnosis not present

## 2022-03-11 DIAGNOSIS — Z992 Dependence on renal dialysis: Secondary | ICD-10-CM | POA: Diagnosis not present

## 2022-03-15 DIAGNOSIS — N2581 Secondary hyperparathyroidism of renal origin: Secondary | ICD-10-CM | POA: Diagnosis not present

## 2022-03-15 DIAGNOSIS — R110 Nausea: Secondary | ICD-10-CM | POA: Diagnosis not present

## 2022-03-15 DIAGNOSIS — R52 Pain, unspecified: Secondary | ICD-10-CM | POA: Diagnosis not present

## 2022-03-15 DIAGNOSIS — L299 Pruritus, unspecified: Secondary | ICD-10-CM | POA: Diagnosis not present

## 2022-03-15 DIAGNOSIS — R209 Unspecified disturbances of skin sensation: Secondary | ICD-10-CM | POA: Diagnosis not present

## 2022-03-15 DIAGNOSIS — D631 Anemia in chronic kidney disease: Secondary | ICD-10-CM | POA: Diagnosis not present

## 2022-03-15 DIAGNOSIS — N186 End stage renal disease: Secondary | ICD-10-CM | POA: Diagnosis not present

## 2022-03-15 DIAGNOSIS — Z992 Dependence on renal dialysis: Secondary | ICD-10-CM | POA: Diagnosis not present

## 2022-03-15 DIAGNOSIS — D689 Coagulation defect, unspecified: Secondary | ICD-10-CM | POA: Diagnosis not present

## 2022-03-16 DIAGNOSIS — L299 Pruritus, unspecified: Secondary | ICD-10-CM | POA: Diagnosis not present

## 2022-03-16 DIAGNOSIS — R209 Unspecified disturbances of skin sensation: Secondary | ICD-10-CM | POA: Diagnosis not present

## 2022-03-16 DIAGNOSIS — D631 Anemia in chronic kidney disease: Secondary | ICD-10-CM | POA: Diagnosis not present

## 2022-03-16 DIAGNOSIS — N2581 Secondary hyperparathyroidism of renal origin: Secondary | ICD-10-CM | POA: Diagnosis not present

## 2022-03-16 DIAGNOSIS — Z992 Dependence on renal dialysis: Secondary | ICD-10-CM | POA: Diagnosis not present

## 2022-03-16 DIAGNOSIS — R52 Pain, unspecified: Secondary | ICD-10-CM | POA: Diagnosis not present

## 2022-03-16 DIAGNOSIS — R110 Nausea: Secondary | ICD-10-CM | POA: Diagnosis not present

## 2022-03-16 DIAGNOSIS — D689 Coagulation defect, unspecified: Secondary | ICD-10-CM | POA: Diagnosis not present

## 2022-03-16 DIAGNOSIS — N186 End stage renal disease: Secondary | ICD-10-CM | POA: Diagnosis not present

## 2022-03-18 DIAGNOSIS — D631 Anemia in chronic kidney disease: Secondary | ICD-10-CM | POA: Diagnosis not present

## 2022-03-18 DIAGNOSIS — D689 Coagulation defect, unspecified: Secondary | ICD-10-CM | POA: Diagnosis not present

## 2022-03-18 DIAGNOSIS — Z992 Dependence on renal dialysis: Secondary | ICD-10-CM | POA: Diagnosis not present

## 2022-03-18 DIAGNOSIS — L299 Pruritus, unspecified: Secondary | ICD-10-CM | POA: Diagnosis not present

## 2022-03-18 DIAGNOSIS — R209 Unspecified disturbances of skin sensation: Secondary | ICD-10-CM | POA: Diagnosis not present

## 2022-03-18 DIAGNOSIS — R110 Nausea: Secondary | ICD-10-CM | POA: Diagnosis not present

## 2022-03-18 DIAGNOSIS — N2581 Secondary hyperparathyroidism of renal origin: Secondary | ICD-10-CM | POA: Diagnosis not present

## 2022-03-18 DIAGNOSIS — N186 End stage renal disease: Secondary | ICD-10-CM | POA: Diagnosis not present

## 2022-03-18 DIAGNOSIS — R52 Pain, unspecified: Secondary | ICD-10-CM | POA: Diagnosis not present

## 2022-03-21 DIAGNOSIS — R110 Nausea: Secondary | ICD-10-CM | POA: Diagnosis not present

## 2022-03-21 DIAGNOSIS — N2581 Secondary hyperparathyroidism of renal origin: Secondary | ICD-10-CM | POA: Diagnosis not present

## 2022-03-21 DIAGNOSIS — D509 Iron deficiency anemia, unspecified: Secondary | ICD-10-CM | POA: Diagnosis not present

## 2022-03-21 DIAGNOSIS — N186 End stage renal disease: Secondary | ICD-10-CM | POA: Diagnosis not present

## 2022-03-21 DIAGNOSIS — D689 Coagulation defect, unspecified: Secondary | ICD-10-CM | POA: Diagnosis not present

## 2022-03-21 DIAGNOSIS — R52 Pain, unspecified: Secondary | ICD-10-CM | POA: Diagnosis not present

## 2022-03-21 DIAGNOSIS — D631 Anemia in chronic kidney disease: Secondary | ICD-10-CM | POA: Diagnosis not present

## 2022-03-21 DIAGNOSIS — L299 Pruritus, unspecified: Secondary | ICD-10-CM | POA: Diagnosis not present

## 2022-03-21 DIAGNOSIS — R209 Unspecified disturbances of skin sensation: Secondary | ICD-10-CM | POA: Diagnosis not present

## 2022-03-21 DIAGNOSIS — Z992 Dependence on renal dialysis: Secondary | ICD-10-CM | POA: Diagnosis not present

## 2022-03-23 DIAGNOSIS — Z992 Dependence on renal dialysis: Secondary | ICD-10-CM | POA: Diagnosis not present

## 2022-03-23 DIAGNOSIS — D689 Coagulation defect, unspecified: Secondary | ICD-10-CM | POA: Diagnosis not present

## 2022-03-23 DIAGNOSIS — N186 End stage renal disease: Secondary | ICD-10-CM | POA: Diagnosis not present

## 2022-03-23 DIAGNOSIS — R209 Unspecified disturbances of skin sensation: Secondary | ICD-10-CM | POA: Diagnosis not present

## 2022-03-23 DIAGNOSIS — N2581 Secondary hyperparathyroidism of renal origin: Secondary | ICD-10-CM | POA: Diagnosis not present

## 2022-03-23 DIAGNOSIS — D631 Anemia in chronic kidney disease: Secondary | ICD-10-CM | POA: Diagnosis not present

## 2022-03-23 DIAGNOSIS — L299 Pruritus, unspecified: Secondary | ICD-10-CM | POA: Diagnosis not present

## 2022-03-23 DIAGNOSIS — R110 Nausea: Secondary | ICD-10-CM | POA: Diagnosis not present

## 2022-03-23 DIAGNOSIS — R52 Pain, unspecified: Secondary | ICD-10-CM | POA: Diagnosis not present

## 2022-03-23 DIAGNOSIS — D509 Iron deficiency anemia, unspecified: Secondary | ICD-10-CM | POA: Diagnosis not present

## 2022-03-26 DIAGNOSIS — D509 Iron deficiency anemia, unspecified: Secondary | ICD-10-CM | POA: Diagnosis not present

## 2022-03-26 DIAGNOSIS — D631 Anemia in chronic kidney disease: Secondary | ICD-10-CM | POA: Diagnosis not present

## 2022-03-26 DIAGNOSIS — Z992 Dependence on renal dialysis: Secondary | ICD-10-CM | POA: Diagnosis not present

## 2022-03-26 DIAGNOSIS — L299 Pruritus, unspecified: Secondary | ICD-10-CM | POA: Diagnosis not present

## 2022-03-26 DIAGNOSIS — N2581 Secondary hyperparathyroidism of renal origin: Secondary | ICD-10-CM | POA: Diagnosis not present

## 2022-03-26 DIAGNOSIS — D689 Coagulation defect, unspecified: Secondary | ICD-10-CM | POA: Diagnosis not present

## 2022-03-26 DIAGNOSIS — R209 Unspecified disturbances of skin sensation: Secondary | ICD-10-CM | POA: Diagnosis not present

## 2022-03-26 DIAGNOSIS — N186 End stage renal disease: Secondary | ICD-10-CM | POA: Diagnosis not present

## 2022-03-26 DIAGNOSIS — R110 Nausea: Secondary | ICD-10-CM | POA: Diagnosis not present

## 2022-03-26 DIAGNOSIS — R52 Pain, unspecified: Secondary | ICD-10-CM | POA: Diagnosis not present

## 2022-03-28 DIAGNOSIS — N186 End stage renal disease: Secondary | ICD-10-CM | POA: Diagnosis not present

## 2022-03-28 DIAGNOSIS — L299 Pruritus, unspecified: Secondary | ICD-10-CM | POA: Diagnosis not present

## 2022-03-28 DIAGNOSIS — D631 Anemia in chronic kidney disease: Secondary | ICD-10-CM | POA: Diagnosis not present

## 2022-03-28 DIAGNOSIS — D509 Iron deficiency anemia, unspecified: Secondary | ICD-10-CM | POA: Diagnosis not present

## 2022-03-28 DIAGNOSIS — R209 Unspecified disturbances of skin sensation: Secondary | ICD-10-CM | POA: Diagnosis not present

## 2022-03-28 DIAGNOSIS — R52 Pain, unspecified: Secondary | ICD-10-CM | POA: Diagnosis not present

## 2022-03-28 DIAGNOSIS — R110 Nausea: Secondary | ICD-10-CM | POA: Diagnosis not present

## 2022-03-28 DIAGNOSIS — N2581 Secondary hyperparathyroidism of renal origin: Secondary | ICD-10-CM | POA: Diagnosis not present

## 2022-03-28 DIAGNOSIS — Z992 Dependence on renal dialysis: Secondary | ICD-10-CM | POA: Diagnosis not present

## 2022-03-28 DIAGNOSIS — D689 Coagulation defect, unspecified: Secondary | ICD-10-CM | POA: Diagnosis not present

## 2022-03-30 DIAGNOSIS — R52 Pain, unspecified: Secondary | ICD-10-CM | POA: Diagnosis not present

## 2022-03-30 DIAGNOSIS — R110 Nausea: Secondary | ICD-10-CM | POA: Diagnosis not present

## 2022-03-30 DIAGNOSIS — N186 End stage renal disease: Secondary | ICD-10-CM | POA: Diagnosis not present

## 2022-03-30 DIAGNOSIS — D689 Coagulation defect, unspecified: Secondary | ICD-10-CM | POA: Diagnosis not present

## 2022-03-30 DIAGNOSIS — Z992 Dependence on renal dialysis: Secondary | ICD-10-CM | POA: Diagnosis not present

## 2022-03-30 DIAGNOSIS — N2581 Secondary hyperparathyroidism of renal origin: Secondary | ICD-10-CM | POA: Diagnosis not present

## 2022-03-30 DIAGNOSIS — R209 Unspecified disturbances of skin sensation: Secondary | ICD-10-CM | POA: Diagnosis not present

## 2022-03-30 DIAGNOSIS — D631 Anemia in chronic kidney disease: Secondary | ICD-10-CM | POA: Diagnosis not present

## 2022-03-30 DIAGNOSIS — L299 Pruritus, unspecified: Secondary | ICD-10-CM | POA: Diagnosis not present

## 2022-03-30 DIAGNOSIS — D509 Iron deficiency anemia, unspecified: Secondary | ICD-10-CM | POA: Diagnosis not present

## 2022-04-02 ENCOUNTER — Other Ambulatory Visit (HOSPITAL_COMMUNITY): Payer: Self-pay | Admitting: Nephrology

## 2022-04-02 DIAGNOSIS — R52 Pain, unspecified: Secondary | ICD-10-CM | POA: Diagnosis not present

## 2022-04-02 DIAGNOSIS — Z992 Dependence on renal dialysis: Secondary | ICD-10-CM | POA: Diagnosis not present

## 2022-04-02 DIAGNOSIS — R110 Nausea: Secondary | ICD-10-CM | POA: Diagnosis not present

## 2022-04-02 DIAGNOSIS — L299 Pruritus, unspecified: Secondary | ICD-10-CM | POA: Diagnosis not present

## 2022-04-02 DIAGNOSIS — D509 Iron deficiency anemia, unspecified: Secondary | ICD-10-CM | POA: Diagnosis not present

## 2022-04-02 DIAGNOSIS — R209 Unspecified disturbances of skin sensation: Secondary | ICD-10-CM | POA: Diagnosis not present

## 2022-04-02 DIAGNOSIS — D689 Coagulation defect, unspecified: Secondary | ICD-10-CM | POA: Diagnosis not present

## 2022-04-02 DIAGNOSIS — D631 Anemia in chronic kidney disease: Secondary | ICD-10-CM | POA: Diagnosis not present

## 2022-04-02 DIAGNOSIS — N2581 Secondary hyperparathyroidism of renal origin: Secondary | ICD-10-CM | POA: Diagnosis not present

## 2022-04-02 DIAGNOSIS — N186 End stage renal disease: Secondary | ICD-10-CM | POA: Diagnosis not present

## 2022-04-04 DIAGNOSIS — D689 Coagulation defect, unspecified: Secondary | ICD-10-CM | POA: Diagnosis not present

## 2022-04-04 DIAGNOSIS — R209 Unspecified disturbances of skin sensation: Secondary | ICD-10-CM | POA: Diagnosis not present

## 2022-04-04 DIAGNOSIS — Z992 Dependence on renal dialysis: Secondary | ICD-10-CM | POA: Diagnosis not present

## 2022-04-04 DIAGNOSIS — N186 End stage renal disease: Secondary | ICD-10-CM | POA: Diagnosis not present

## 2022-04-04 DIAGNOSIS — N2581 Secondary hyperparathyroidism of renal origin: Secondary | ICD-10-CM | POA: Diagnosis not present

## 2022-04-04 DIAGNOSIS — L299 Pruritus, unspecified: Secondary | ICD-10-CM | POA: Diagnosis not present

## 2022-04-04 DIAGNOSIS — D509 Iron deficiency anemia, unspecified: Secondary | ICD-10-CM | POA: Diagnosis not present

## 2022-04-04 DIAGNOSIS — R110 Nausea: Secondary | ICD-10-CM | POA: Diagnosis not present

## 2022-04-04 DIAGNOSIS — D631 Anemia in chronic kidney disease: Secondary | ICD-10-CM | POA: Diagnosis not present

## 2022-04-04 DIAGNOSIS — R52 Pain, unspecified: Secondary | ICD-10-CM | POA: Diagnosis not present

## 2022-04-05 ENCOUNTER — Ambulatory Visit (INDEPENDENT_AMBULATORY_CARE_PROVIDER_SITE_OTHER): Payer: 59 | Admitting: Family Medicine

## 2022-04-05 ENCOUNTER — Other Ambulatory Visit: Payer: Self-pay | Admitting: Radiology

## 2022-04-05 ENCOUNTER — Encounter (HOSPITAL_BASED_OUTPATIENT_CLINIC_OR_DEPARTMENT_OTHER): Payer: Self-pay | Admitting: Family Medicine

## 2022-04-05 VITALS — BP 130/81 | HR 109 | Ht 74.0 in | Wt 255.5 lb

## 2022-04-05 DIAGNOSIS — G4700 Insomnia, unspecified: Secondary | ICD-10-CM | POA: Diagnosis not present

## 2022-04-05 DIAGNOSIS — Z0000 Encounter for general adult medical examination without abnormal findings: Secondary | ICD-10-CM

## 2022-04-05 NOTE — Progress Notes (Signed)
New Patient Office Visit  Subjective    Patient ID: Michael [name], male    DOB: 12-Sep-1975  Age: 47 y.o. MRN: [id number]  CC:  Chief Complaint  Patient presents with   New Patient (Initial Visit)    Pt here to establish new care    HPI Elius Etheredge presents to establish care Last PCP - last visit over a year   ESRD on dialysis: follows with Dr. Clover Mealy. Has been on dialysis for about 2.5 years. Underlying diagnosis was polycystic kidney disease  Patient does have history of insomnia for which he uses Lunesta to help with controlling symptoms  Patient is originally from [place], [place]. Has lived here since 2006. Patient is currently on disability, does do some [employer]. Outside of work, he enjoys fishing, hiking, camping, watching movies.  Outpatient Encounter Medications as of 04/05/2022  Medication Sig   eszopiclone (LUNESTA) 2 MG TABS tablet Take 1 tablet (2 mg total) by mouth at bedtime as needed for sleep. Take immediately before bedtime   [DISCONTINUED] amoxicillin-clavulanate (AUGMENTIN) 875-125 MG tablet Take 1 tablet by mouth every 12 (twelve) hours. (Patient not taking: Reported on 02/20/2022)   [DISCONTINUED] calcium acetate (PHOSLO) 667 MG capsule Take 1,334-2,001 mg by mouth See admin instructions. Take 1334 to 2001 mg with each meal (based on the size)   [DISCONTINUED] cyclobenzaprine (FLEXERIL) 10 MG tablet Take 1 tablet (10 mg total) by mouth 2 (two) times daily as needed for muscle spasms. (Patient not taking: Reported on 02/20/2022)   [DISCONTINUED] ibuprofen (ADVIL) 200 MG tablet Take 600 mg by mouth every 6 (six) hours as needed for moderate pain.   [DISCONTINUED] isosorbide dinitrate (ISORDIL) 10 MG tablet Take 1 tablet (10 mg total) by mouth 2 (two) times daily. (Patient not taking: No sig reported)   [DISCONTINUED] lidocaine (XYLOCAINE) 2 % solution Use as directed 10 mLs in the mouth or throat every 6 (six) hours as needed for mouth pain.   [DISCONTINUED] oxyCODONE  (OXY IR/ROXICODONE) 5 MG immediate release tablet Take 1 tablet (5 mg total) by mouth every 8 (eight) hours as needed for severe pain. (Patient not taking: Reported on 02/20/2022)   [DISCONTINUED] oxyCODONE (ROXICODONE) 5 MG immediate release tablet Take 1 tablet (5 mg total) by mouth every 8 (eight) hours as needed. (Patient not taking: Reported on 10/13/2021)   [DISCONTINUED] oxyCODONE-acetaminophen (PERCOCET) 10-325 MG tablet Take 1 tablet by mouth every 6 (six) hours as needed for pain. (Patient not taking: Reported on 10/13/2021)   [DISCONTINUED] polyethylene glycol (MIRALAX) 17 g packet Take 17 g by mouth daily. (Patient taking differently: Take 17 g by mouth daily as needed (constipation.).)   [DISCONTINUED] PRESCRIPTION MEDICATION Inhale into the lungs at bedtime. CPAP   No facility-administered encounter medications on file as of 04/05/2022.    Past Medical History:  Diagnosis Date   GERD (gastroesophageal reflux disease)    Gout    History of renal dialysis    M-W-F   HLD (hyperlipidemia)    Hypertension    Intracranial aneurysm    s/p coil embolization: right middle meningeal artery 09/08/20, pericallosal artery 09/22/20, right MCA 12/22/20   Pneumonia    Polycystic kidney disease    SDH (subdural hematoma) ([place])    subacute right SDH 08/23/20 CTA head   Sleep apnea 07/29/2018   uses cpap nightly   Vitamin D deficiency 11/2018    Past Surgical History:  Procedure Laterality Date   AV FISTULA PLACEMENT Left 03/24/2020   Procedure: INSERTION OF LEFT  UPPER EXTREMITY ARTERIOVENOUS (AV) GORE-TEX GRAFT;  Surgeon: [name], MD;  Location: [facility];  Service: Vascular;  Laterality: Left;  PERIPHERAL NERVE BLOCK   [place] Left 12/11/2019   Procedure: 1ST STAGE BASILIC TRANSPOSITION OF LEFT ARM;  Surgeon: Angelia Mould, MD;  Location: [place];  Service: Vascular;  Laterality: Left;   BIOPSY  04/06/2021   Procedure: BIOPSY;  Surgeon: Thornton Park, MD;   [facility];  Service: Gastroenterology;;   COLONOSCOPY WITH PROPOFOL N/A 04/06/2021   Procedure: COLONOSCOPY WITH PROPOFOL;  Surgeon: Thornton Park, MD;  [facility];  Service: Gastroenterology;  Laterality: N/A;   COLONOSCOPY WITH PROPOFOL N/A 05/29/2021   Procedure: COLONOSCOPY WITH PROPOFOL;  Surgeon: Rush Landmark Telford Nab., MD;  [facility];  Service: Gastroenterology;  Laterality: N/A;   DG AV DIALYSIS GRAFT DECLOT OR Left 05/17/2020   ENDOSCOPIC MUCOSAL RESECTION  05/29/2021   Procedure: ENDOSCOPIC MUCOSAL RESECTION;  Surgeon: Rush Landmark Telford Nab., MD;  Location: [facility];  Service: Gastroenterology;;   HEMOSTASIS CLIP PLACEMENT  05/29/2021   Procedure: HEMOSTASIS CLIP PLACEMENT;  Surgeon: Irving Copas., MD;  Location: [facility];  Service: Gastroenterology;;   IR FLUORO GUIDE CV LINE RIGHT  12/09/2019   IR US GUIDE VASC ACCESS RIGHT  12/09/2019   LEFT HEART CATH AND CORONARY ANGIOGRAPHY N/A 12/09/2019   Procedure: LEFT HEART CATH AND CORONARY ANGIOGRAPHY;  Surgeon: Adrian Prows, MD;  Location: [facility];  Service: Cardiovascular;  Laterality: N/A;   POLYPECTOMY  04/06/2021   Procedure: POLYPECTOMY;  Surgeon: Thornton Park, MD;  [facility];  Service: Gastroenterology;;   POLYPECTOMY  05/29/2021   Procedure: POLYPECTOMY;  Surgeon: Irving Copas., MD;  Location: [facility];  Service: Gastroenterology;;   REVISION OF ARTERIOVENOUS GORETEX GRAFT Left 08/31/2021   Procedure: REDO LEFT UPPER ARM ARTERIOVENOUS GORETEX GRAFT;  Surgeon: Broadus John, MD;  Location: [place];  Service: Vascular;  Laterality: Left;  [place] INJECTION  05/29/2021   Procedure: SUBMUCOSAL INJECTION;  Surgeon: Irving Copas., MD;  Location: [facility];  Service: Gastroenterology;;  EPI   SUBMUCOSAL LIFTING INJECTION  05/29/2021   Procedure: SUBMUCOSAL LIFTING INJECTION;  Surgeon: Irving Copas., MD;  Location: [facility];  Service: Gastroenterology;;  [place] INJECTION  04/06/2021   Procedure: SUBMUCOSAL TATTOO INJECTION;  Surgeon: Thornton Park, MD;  Location: [facility];  Service: Gastroenterology;;   UPPER EXTREMITY VENOGRAPHY Bilateral 08/24/2021   Procedure: UPPER EXTREMITY VENOGRAPHY;  Surgeon: Marty Heck, MD;  Location: [facility];  Service: Cardiovascular;  Laterality: Bilateral;    Family History  Problem Relation Age of Onset   Diabetes Mother    Ovarian cancer Mother    Bipolar disorder Father    Hypertension Father    Prostate cancer Father    Prostate cancer Paternal Grandfather    Polycystic kidney disease Neg Hx     Social History   Socioeconomic History   Marital status: Single    Spouse name: Not on file   Number of children: 1   Years of education: Not on file   Highest education level: Not on file  Occupational History   Occupation: [employer]   Occupation: disabled  Tobacco Use   Smoking status: Former    Packs/day: 0.20    Years: 10.00    Total pack years: 2.00    Types: Cigarettes    Quit date: 2015    Years since  quitting: 9.0   Smokeless tobacco: Never  Vaping Use   Vaping Use: Never used  Substance and Sexual Activity   Alcohol use: Not Currently    Comment: occasional wine   Drug use: No   Sexual activity: Yes  Other Topics Concern   Not on file  Social History Narrative   Not on file   Social Determinants of Health   Financial Resource Strain: High Risk (08/30/2020)   Overall Financial Resource Strain (CARDIA)    Difficulty of Paying Living Expenses: Very hard  Food Insecurity: Food Insecurity Present (10/19/2021)   Hunger Vital Sign    Worried About Running Out of Food in the Last Year: Often true    Ran Out of Food in the Last Year: Often true  Transportation Needs: No Transportation Needs (10/19/2021)   PRAPARE - Hydrologist (Medical): No     Lack of Transportation (Non-Medical): No  Physical Activity: Insufficiently Active (08/30/2020)   Exercise Vital Sign    Days of Exercise per Week: 3 days    Minutes of Exercise per Session: 30 min  Stress: Stress Concern Present (08/30/2020)   [place]    Feeling of Stress : To some extent  Social Connections: Moderately Integrated (08/30/2020)   Social Connection and Isolation Panel [NHANES]    Frequency of Communication with Friends and Family: More than three times a week    Frequency of Social Gatherings with Friends and Family: Once a week    Attends Religious Services: 1 to 4 times per year    Active Member of [employer] or Organizations: Yes    Attends Archivist Meetings: 1 to 4 times per year    Marital Status: Never married  Intimate Partner Violence: Not At Risk (08/30/2020)   Humiliation, Afraid, Rape, and Kick questionnaire    Fear of Current or Ex-Partner: No    Emotionally Abused: No    Physically Abused: No    Sexually Abused: No    Objective    BP 130/81 (BP Location: Left Arm, Patient Position: Sitting, Cuff Size: Large)   Pulse (!) 109   Ht '6\' 2"'$  (1.88 m)   Wt 255 lb 8 oz (115.9 kg)   SpO2 99%   BMI 32.80 kg/m   Physical Exam  47 year old male in no acute distress Cardiovascular exam with regular rate and rhythm Lungs clear to auscultation bilaterally  Assessment & Plan:   Problem List Items Addressed This Visit       Other   Insomnia - Primary    Can continue with Lunesta at this time.  Most recently was prescribed by his sleep medicine provider      Will arrange for physical exam to be completed in the near future.  Patient will present shortly before appointment to complete baseline labs  Return in about 3 months (around 07/05/2022) for CPE with [facility]W a few days prior.   Danay Mckellar J [place], MD

## 2022-04-05 NOTE — Assessment & Plan Note (Signed)
Can continue with Lunesta at this time.  Most recently was prescribed by his sleep medicine provider

## 2022-04-06 DIAGNOSIS — R52 Pain, unspecified: Secondary | ICD-10-CM | POA: Diagnosis not present

## 2022-04-06 DIAGNOSIS — N186 End stage renal disease: Secondary | ICD-10-CM | POA: Diagnosis not present

## 2022-04-06 DIAGNOSIS — R110 Nausea: Secondary | ICD-10-CM | POA: Diagnosis not present

## 2022-04-06 DIAGNOSIS — D509 Iron deficiency anemia, unspecified: Secondary | ICD-10-CM | POA: Diagnosis not present

## 2022-04-06 DIAGNOSIS — D689 Coagulation defect, unspecified: Secondary | ICD-10-CM | POA: Diagnosis not present

## 2022-04-06 DIAGNOSIS — N2581 Secondary hyperparathyroidism of renal origin: Secondary | ICD-10-CM | POA: Diagnosis not present

## 2022-04-06 DIAGNOSIS — L299 Pruritus, unspecified: Secondary | ICD-10-CM | POA: Diagnosis not present

## 2022-04-06 DIAGNOSIS — Z992 Dependence on renal dialysis: Secondary | ICD-10-CM | POA: Diagnosis not present

## 2022-04-06 DIAGNOSIS — D631 Anemia in chronic kidney disease: Secondary | ICD-10-CM | POA: Diagnosis not present

## 2022-04-06 DIAGNOSIS — R209 Unspecified disturbances of skin sensation: Secondary | ICD-10-CM | POA: Diagnosis not present

## 2022-04-09 ENCOUNTER — Other Ambulatory Visit: Payer: Self-pay | Admitting: Radiology

## 2022-04-09 ENCOUNTER — Other Ambulatory Visit: Payer: Self-pay | Admitting: Student

## 2022-04-09 DIAGNOSIS — D509 Iron deficiency anemia, unspecified: Secondary | ICD-10-CM | POA: Diagnosis not present

## 2022-04-09 DIAGNOSIS — L299 Pruritus, unspecified: Secondary | ICD-10-CM | POA: Diagnosis not present

## 2022-04-09 DIAGNOSIS — D689 Coagulation defect, unspecified: Secondary | ICD-10-CM | POA: Diagnosis not present

## 2022-04-09 DIAGNOSIS — R110 Nausea: Secondary | ICD-10-CM | POA: Diagnosis not present

## 2022-04-09 DIAGNOSIS — N2581 Secondary hyperparathyroidism of renal origin: Secondary | ICD-10-CM | POA: Diagnosis not present

## 2022-04-09 DIAGNOSIS — Z992 Dependence on renal dialysis: Secondary | ICD-10-CM | POA: Diagnosis not present

## 2022-04-09 DIAGNOSIS — N186 End stage renal disease: Secondary | ICD-10-CM | POA: Diagnosis not present

## 2022-04-09 DIAGNOSIS — R209 Unspecified disturbances of skin sensation: Secondary | ICD-10-CM | POA: Diagnosis not present

## 2022-04-09 DIAGNOSIS — D631 Anemia in chronic kidney disease: Secondary | ICD-10-CM | POA: Diagnosis not present

## 2022-04-09 DIAGNOSIS — R52 Pain, unspecified: Secondary | ICD-10-CM | POA: Diagnosis not present

## 2022-04-09 MED ORDER — PREDNISONE 50 MG PO TABS: ORAL_TABLET | ORAL | 3 refills | Status: DC

## 2022-04-09 NOTE — H&P (Incomplete)
Chief Complaint: Patient was seen in consultation today for AVF/G shuntogram and possible interventions  at the request of [name]  Referring Physician(s): Corliss Parish  Supervising Physician: Daryll Brod  Patient Status: [facility] - Out-pt  History of Present Illness: Michael [name] is a 47 y.o. male with PMHs of HTN, HLD, polycystic kidney disease, SDH, intracranial aneurysm s/p intervention and ESRD on RRT via LUE brachioaxillary AVF/G who presents for shuntogram and possible interventions.   Patient is known to IR service for image guided R IJ tunneled HA cath placement on 12/09/19, performed by Dr. Pascal Lux.  Patient underwent LUE AVG insertion in Janary 2022, and redo in June 2023.  Patient went to dialysis center recently and it was found that his graft could be malfunctioning, they were not able to get to prescribed blood flow rate.   IR was requested for AVG shuntogram and possible interventions. ***  Past Medical History:  Diagnosis Date   GERD (gastroesophageal reflux disease)    Gout    History of renal dialysis    M-W-F   HLD (hyperlipidemia)    Hypertension    Intracranial aneurysm    s/p coil embolization: right middle meningeal artery 09/08/20, pericallosal artery 09/22/20, right MCA 12/22/20   Pneumonia    Polycystic kidney disease    SDH (subdural hematoma) ([place])    subacute right SDH 08/23/20 CTA head   Sleep apnea 07/29/2018   uses cpap nightly   Vitamin D deficiency 11/2018    Past Surgical History:  Procedure Laterality Date   AV FISTULA PLACEMENT Left 03/24/2020   Procedure: INSERTION OF LEFT UPPER EXTREMITY ARTERIOVENOUS (AV) GORE-TEX GRAFT;  Surgeon: [name], MD;  Location: [facility];  Service: Vascular;  Laterality: Left;  PERIPHERAL NERVE BLOCK   [place] Left 12/11/2019   Procedure: 1ST STAGE BASILIC TRANSPOSITION OF LEFT ARM;  Surgeon: Angelia Mould, MD;  Location: [place];  Service: Vascular;   Laterality: Left;   BIOPSY  04/06/2021   Procedure: BIOPSY;  Surgeon: Thornton Park, MD;  [facility];  Service: Gastroenterology;;   COLONOSCOPY WITH PROPOFOL N/A 04/06/2021   Procedure: COLONOSCOPY WITH PROPOFOL;  Surgeon: Thornton Park, MD;  [facility];  Service: Gastroenterology;  Laterality: N/A;   COLONOSCOPY WITH PROPOFOL N/A 05/29/2021   Procedure: COLONOSCOPY WITH PROPOFOL;  Surgeon: Rush Landmark Telford Nab., MD;  [facility];  Service: Gastroenterology;  Laterality: N/A;   DG AV DIALYSIS GRAFT DECLOT OR Left 05/17/2020   ENDOSCOPIC MUCOSAL RESECTION  05/29/2021   Procedure: ENDOSCOPIC MUCOSAL RESECTION;  Surgeon: Rush Landmark Telford Nab., MD;  Location: [facility];  Service: Gastroenterology;;   HEMOSTASIS CLIP PLACEMENT  05/29/2021   Procedure: HEMOSTASIS CLIP PLACEMENT;  Surgeon: Irving Copas., MD;  Location: [facility];  Service: Gastroenterology;;   IR FLUORO GUIDE CV LINE RIGHT  12/09/2019   IR US GUIDE VASC ACCESS RIGHT  12/09/2019   LEFT HEART CATH AND CORONARY ANGIOGRAPHY N/A 12/09/2019   Procedure: LEFT HEART CATH AND CORONARY ANGIOGRAPHY;  Surgeon: Adrian Prows, MD;  Location: [place] [facility];  Service: Cardiovascular;  Laterality: N/A;   POLYPECTOMY  04/06/2021   Procedure: POLYPECTOMY;  Surgeon: Thornton Park, MD;  [facility];  Service: Gastroenterology;;   POLYPECTOMY  05/29/2021   Procedure: POLYPECTOMY;  Surgeon: Irving Copas., MD;  Location: [facility];  Service: Gastroenterology;;   REVISION OF ARTERIOVENOUS GORETEX GRAFT Left 08/31/2021   Procedure: REDO LEFT UPPER ARM ARTERIOVENOUS GORETEX GRAFT;  Surgeon: Broadus John, MD;  [facility];  Service: Vascular;  Laterality: Left;  PERIPHERAL NERVE BLOCK   SUBMUCOSAL INJECTION  05/29/2021   Procedure: SUBMUCOSAL INJECTION;  Surgeon: Rush Landmark Telford Nab., MD;  Location: [facility];  Service: Gastroenterology;;  EPI   SUBMUCOSAL  LIFTING INJECTION  05/29/2021   Procedure: SUBMUCOSAL LIFTING INJECTION;  Surgeon: Irving Copas., MD;  Location: [facility];  Service: Gastroenterology;;  [place] INJECTION  04/06/2021   Procedure: SUBMUCOSAL TATTOO INJECTION;  Surgeon: Thornton Park, MD;  Location: [facility];  Service: Gastroenterology;;   UPPER EXTREMITY VENOGRAPHY Bilateral 08/24/2021   Procedure: UPPER EXTREMITY VENOGRAPHY;  Surgeon: Marty Heck, MD;  Location: [place] [facility];  Service: Cardiovascular;  Laterality: Bilateral;    Allergies: Ivp dye [iodinated contrast media]  Medications: Prior to Admission medications   Medication Sig Start Date End Date Taking? Authorizing Provider  eszopiclone (LUNESTA) 2 MG TABS tablet Take 1 tablet (2 mg total) by mouth at bedtime as needed for sleep. Take immediately before bedtime 02/20/22   Sherrilyn Rist A, MD  predniSONE (DELTASONE) 50 MG tablet To be taken 13 hours, 7 hours and 1 hour prior to CT exam. 04/09/22   Jacqualine Mau, NP  isosorbide dinitrate (ISORDIL) 10 MG tablet Take 1 tablet (10 mg total) by mouth 2 (two) times daily. Patient not taking: No sig reported 02/04/20 03/24/20  [name], FNP     Family History  Problem Relation Age of Onset   Diabetes Mother    Ovarian cancer Mother    Bipolar disorder Father    Hypertension Father    Prostate cancer Father    Prostate cancer Paternal Grandfather    Polycystic kidney disease Neg Hx     Social History   Socioeconomic History   Marital status: Single    Spouse name: Not on file   Number of children: 1   Years of education: Not on file   Highest education level: Not on file  Occupational History   Occupation: [employer]   Occupation: disabled  Tobacco Use   Smoking status: Former    Packs/day: 0.20    Years: 10.00    Total pack years: 2.00    Types: Cigarettes    Quit date: 2015    Years since quitting: 9.0   Smokeless tobacco: Never   Vaping Use   Vaping Use: Never used  Substance and Sexual Activity   Alcohol use: Not Currently    Comment: occasional wine   Drug use: No   Sexual activity: Yes  Other Topics Concern   Not on file  Social History Narrative   Not on file   Social Determinants of Health   Financial Resource Strain: High Risk (08/30/2020)   Overall Financial Resource Strain (CARDIA)    Difficulty of Paying Living Expenses: Very hard  Food Insecurity: Food Insecurity Present (10/19/2021)   Hunger Vital Sign    Worried About [place] in the Last Year: Often true    Ran Out of Food in the Last Year: Often true  Transportation Needs: No Transportation Needs (10/19/2021)   PRAPARE - Hydrologist (Medical): No    Lack of Transportation (Non-Medical): No  Physical Activity: Insufficiently Active (08/30/2020)   Exercise Vital Sign    Days of Exercise per Week: 3 days    Minutes of Exercise per Session: 30 min  Stress: Stress Concern Present (08/30/2020)   [place]  Stress Questionnaire    Feeling of Stress : To some extent  Social Connections: Moderately Integrated (08/30/2020)   Social Connection and Isolation Panel [NHANES]    Frequency of Communication with Friends and Family: More than three times a week    Frequency of Social Gatherings with Friends and Family: Once a week    Attends Religious Services: 1 to 4 times per year    Active Member of [employer] or Organizations: Yes    Attends Archivist Meetings: 1 to 4 times per year    Marital Status: Never married     Review of Systems: A 12 point ROS discussed and pertinent positives are indicated in the HPI above.  All other systems are negative.  Vital Signs: There were no vitals taken for this visit.  *** Physical Exam     Imaging: No results found.  Labs:  CBC: Recent Labs    08/24/21 0957 08/31/21 0605 09/03/21 1244 01/11/22 2118  WBC   --   --  10.6* 8.3  HGB 12.6* 13.9 11.4* 14.1  HCT 37.0* 41.0 36.2* 49.1  PLT  --   --  197 180    COAGS: No results for input(s): "INR", "APTT" in the last 8760 hours.  BMP: Recent Labs    08/24/21 0957 08/31/21 0605 09/03/21 1244 01/11/22 2118  NA 137 135 138 139  K 5.5* 4.6 6.0* 4.8  CL 99 105 96* 100  CO2  --   --  27 24  GLUCOSE 86 98 81 140*  BUN 44* 40* 57* 46*  CALCIUM  --   --  9.4 9.9  CREATININE 10.30* 10.60* 13.64* 13.99*  GFRNONAA  --   --  4* 4*    LIVER FUNCTION TESTS: No results for input(s): "BILITOT", "AST", "ALT", "ALKPHOS", "PROT", "ALBUMIN" in the last 8760 hours.  TUMOR MARKERS: No results for input(s): "AFPTM", "CEA", "CA199", "CHROMGRNA" in the last 8760 hours.  Assessment and Plan: 47 y.o. male with ESRD on RRT via brachioaxillary LUE  AVG who presents for shuntogram and possible interventions.   NPO since [place] VSS Not on AC/AP   Risks and benefits of the dialysis circuit study were discussed with the patient including, but not limited to bleeding, infection, vascular injury, and inability to improve the function of the fistula/graft which would require placement of a tunneled dialysis catheter.  All of the patient's questions were answered, patient is agreeable to proceed. Consent signed and in chart.   Thank you for this interesting consult.  I greatly enjoyed meeting [employer] and look forward to participating in their care.  A copy of this report was sent to the requesting provider on this date.  Electronically Signed: Tera Mater, PA-C 04/09/2022, 1:26 PM   I spent a total of  30 Minutes   in face to face in clinical consultation, greater than 50% of which was counseling/coordinating care for AVG shuntogram with possible interventions.   This chart was dictated using voice recognition software.  Despite best efforts to proofread,  errors can occur which can change the documentation meaning.

## 2022-04-10 ENCOUNTER — Other Ambulatory Visit: Payer: Self-pay

## 2022-04-10 ENCOUNTER — Ambulatory Visit (HOSPITAL_COMMUNITY)
Admission: RE | Admit: 2022-04-10 | Discharge: 2022-04-10 | Disposition: A | Discharge status: Home / Self Care | Payer: 59 | Source: Ambulatory Visit | Attending: Nephrology | Admitting: Nephrology

## 2022-04-10 DIAGNOSIS — N186 End stage renal disease: Secondary | ICD-10-CM

## 2022-04-10 MED ORDER — SODIUM CHLORIDE 0.9 % IV SOLN: INTRAVENOUS | Status: DC

## 2022-04-10 NOTE — Progress Notes (Signed)
Harriett,RN notified that pt wishes to speak to someone about reimbursement for procedure including gas money.  Asked her to come speak to pt.

## 2022-04-10 NOTE — Progress Notes (Signed)
Upon admission assessment, I discovered that pt has a dye allergy and has not been pretreated.  17 and Aimee,PA notified.  They verbalize that procedure will be cancelled.  Pt is very upset and wants to proceed with procedure anyway.  Explained risks to pt but pt still wants to proceed.  Aimee,PA asked to come and speak with pt.

## 2022-04-10 NOTE — Progress Notes (Addendum)
Michael [name] presented to [facility] for shuntogram and possible inter it was found that he is allergic to IV contrast, develops itchiness and had to be treated before with Benadryl and possible steroid.   Discussed with Dr. Annamaria Boots, need to reschedule the procedure.  Informed by the [facility] staff that patient is upset and would like to discuss why the procedure needs to be rescheduled as he only develops itchiness after IV contrast.  After lengthy discussion, patient agreed to reschedule the procedure but would like to speak with representative from [facility] to discuss his experience today.  SS staff notified.   IR schedulers notified, patient will receive a call from IR schedulers to reschedule the procedure.   Armando Gang Aubreana Cornacchia PA-C 04/10/2022 8:56 AM

## 2022-04-11 DIAGNOSIS — R52 Pain, unspecified: Secondary | ICD-10-CM | POA: Diagnosis not present

## 2022-04-11 DIAGNOSIS — N186 End stage renal disease: Secondary | ICD-10-CM | POA: Diagnosis not present

## 2022-04-11 DIAGNOSIS — Z992 Dependence on renal dialysis: Secondary | ICD-10-CM | POA: Diagnosis not present

## 2022-04-11 DIAGNOSIS — D509 Iron deficiency anemia, unspecified: Secondary | ICD-10-CM | POA: Diagnosis not present

## 2022-04-11 DIAGNOSIS — D631 Anemia in chronic kidney disease: Secondary | ICD-10-CM | POA: Diagnosis not present

## 2022-04-11 DIAGNOSIS — N2581 Secondary hyperparathyroidism of renal origin: Secondary | ICD-10-CM | POA: Diagnosis not present

## 2022-04-11 DIAGNOSIS — L299 Pruritus, unspecified: Secondary | ICD-10-CM | POA: Diagnosis not present

## 2022-04-11 DIAGNOSIS — D689 Coagulation defect, unspecified: Secondary | ICD-10-CM | POA: Diagnosis not present

## 2022-04-11 DIAGNOSIS — R209 Unspecified disturbances of skin sensation: Secondary | ICD-10-CM | POA: Diagnosis not present

## 2022-04-11 DIAGNOSIS — R110 Nausea: Secondary | ICD-10-CM | POA: Diagnosis not present

## 2022-04-12 ENCOUNTER — Other Ambulatory Visit: Payer: Self-pay | Admitting: Radiology

## 2022-04-12 ENCOUNTER — Telehealth: Payer: Self-pay | Admitting: Radiology

## 2022-04-12 NOTE — Telephone Encounter (Signed)
   Left message to pt today regarding 04/17/22 IR procedure. He is allergic to contrast --- meds were called to [facility] 04/09/22 per chart.  No pre meds for 1/22 procedure were taken He was rescheduled from 04/10/22 to 04/17/22 to allow for premedication for contrast allergy.  LM reminding of appt and pre medication needs

## 2022-04-13 ENCOUNTER — Encounter (HOSPITAL_BASED_OUTPATIENT_CLINIC_OR_DEPARTMENT_OTHER): Payer: Self-pay | Admitting: Emergency Medicine

## 2022-04-13 ENCOUNTER — Emergency Department (HOSPITAL_BASED_OUTPATIENT_CLINIC_OR_DEPARTMENT_OTHER)
Admission: EM | Admit: 2022-04-13 | Discharge: 2022-04-13 | Disposition: A | Discharge status: Home / Self Care | Payer: 59 | Attending: Emergency Medicine | Admitting: Emergency Medicine

## 2022-04-13 ENCOUNTER — Other Ambulatory Visit: Payer: Self-pay

## 2022-04-13 DIAGNOSIS — R52 Pain, unspecified: Secondary | ICD-10-CM | POA: Diagnosis not present

## 2022-04-13 DIAGNOSIS — N2581 Secondary hyperparathyroidism of renal origin: Secondary | ICD-10-CM | POA: Diagnosis not present

## 2022-04-13 DIAGNOSIS — N3001 Acute cystitis with hematuria: Secondary | ICD-10-CM | POA: Insufficient documentation

## 2022-04-13 DIAGNOSIS — Z992 Dependence on renal dialysis: Secondary | ICD-10-CM | POA: Insufficient documentation

## 2022-04-13 DIAGNOSIS — R319 Hematuria, unspecified: Secondary | ICD-10-CM | POA: Diagnosis present

## 2022-04-13 DIAGNOSIS — N186 End stage renal disease: Secondary | ICD-10-CM | POA: Diagnosis not present

## 2022-04-13 DIAGNOSIS — D689 Coagulation defect, unspecified: Secondary | ICD-10-CM | POA: Diagnosis not present

## 2022-04-13 DIAGNOSIS — R209 Unspecified disturbances of skin sensation: Secondary | ICD-10-CM | POA: Diagnosis not present

## 2022-04-13 DIAGNOSIS — L299 Pruritus, unspecified: Secondary | ICD-10-CM | POA: Diagnosis not present

## 2022-04-13 DIAGNOSIS — R110 Nausea: Secondary | ICD-10-CM | POA: Diagnosis not present

## 2022-04-13 DIAGNOSIS — R300 Dysuria: Secondary | ICD-10-CM

## 2022-04-13 DIAGNOSIS — D509 Iron deficiency anemia, unspecified: Secondary | ICD-10-CM | POA: Diagnosis not present

## 2022-04-13 DIAGNOSIS — D631 Anemia in chronic kidney disease: Secondary | ICD-10-CM | POA: Diagnosis not present

## 2022-04-13 LAB — URINALYSIS, W/ REFLEX TO CULTURE (INFECTION SUSPECTED)
Bilirubin Urine: NEGATIVE
Glucose, UA: NEGATIVE mg/dL
Ketones, ur: NEGATIVE mg/dL
Nitrite: NEGATIVE
Protein, ur: >300 mg/dL — AB
RBC / HPF: >50 RBC/hpf (ref 0–5)
Specific Gravity, Urine: 1.01 (ref 1.005–1.030)
WBC, UA: >50 WBC/hpf (ref 0–5)
pH: 8 (ref 5.0–8.0)

## 2022-04-13 MED ORDER — DOXYCYCLINE HYCLATE 100 MG PO CAPS: 100.0000 mg | ORAL_CAPSULE | Freq: Two times a day (BID) | ORAL | 0 refills | Status: DC

## 2022-04-13 MED ORDER — CEPHALEXIN 500 MG PO CAPS: 500.0000 mg | ORAL_CAPSULE | Freq: Every day | ORAL | 0 refills | Status: DC

## 2022-04-13 MED ORDER — STERILE WATER FOR INJECTION IJ SOLN
INTRAMUSCULAR | Status: AC
  Administered 2022-04-13: 1 mL
  Filled 2022-04-13: qty 10

## 2022-04-13 MED ORDER — CEFTRIAXONE SODIUM 500 MG IJ SOLR
500.0000 mg | Freq: Once | INTRAMUSCULAR | Status: AC
  Administered 2022-04-13: 500 mg via INTRAMUSCULAR
  Filled 2022-04-13: qty 500

## 2022-04-13 NOTE — Discharge Instructions (Signed)
Your urine test today shows blood and infection fighting cells.  You will be covered for both sexually-transmitted infection as well as atypical bladder infection.  You were given a shot of Rocephin here.  You will be started on doxycycline.  This medication you can discontinue if your gonorrhea and Chlamydia test are both negative.  You may check your MyChart.  The cephalexin you will take once per day, after dialysis on dialysis days.  See your doctor in the next 5 to 7 days for follow-up to ensure symptoms are improving.  Return to the emergency department with fever, vomiting, worsening pain.

## 2022-04-13 NOTE — ED Provider Notes (Signed)
[place] Provider Note   CSN: [id number] Arrival date & time: 04/13/22  1353     History  Chief Complaint  Patient presents with   Hematuria   Dysuria    Michael [name] is a 47 y.o. male.  Patient with history of ESRD on hemodialysis presents to the emergency department for dysuria, and hematuria.  Patient has had some discomfort with urination over the past 4 days.  He states that he urinates 3-4 times per day.  Over the past day he has noted some blood in the urine as well.  He denies associated fever, vomiting, flank pain.  He denies abdominal pain.  He is going to dialysis like usual.  He does report receptive oral intercourse and does have a concern about possibility of STI.  Denies any sores, lesions externally and denies penile discharge.  No treatments prior to arrival.       Home Medications Prior to Admission medications   Medication Sig Start Date End Date Taking? Authorizing Provider  cephALEXin (KEFLEX) 500 MG capsule Take 1 capsule (500 mg total) by mouth daily. Administer after hemodialysis on dialysis days. 04/13/22  Yes Carlisle Cater, PA-C  doxycycline (VIBRAMYCIN) 100 MG capsule Take 1 capsule (100 mg total) by mouth 2 (two) times daily. 04/13/22  Yes Carlisle Cater, PA-C  eszopiclone (LUNESTA) 2 MG TABS tablet Take 1 tablet (2 mg total) by mouth at bedtime as needed for sleep. Take immediately before bedtime 02/20/22   Sherrilyn Rist A, MD  predniSONE (DELTASONE) 50 MG tablet To be taken 13 hours, 7 hours and 1 hour prior to CT exam. 04/09/22   Jacqualine Mau, NP  isosorbide dinitrate (ISORDIL) 10 MG tablet Take 1 tablet (10 mg total) by mouth 2 (two) times daily. Patient not taking: No sig reported 02/04/20 03/24/20  [name], FNP      Allergies    Ivp dye [iodinated contrast media]    Review of Systems   Review of Systems  Physical Exam Updated Vital Signs BP 127/88 (BP Location: Right Arm)    Pulse 97   Temp 97.8 F (36.6 C) (Temporal)   Resp 16   Wt 115.2 kg   SpO2 97%   BMI 32.61 kg/m   Physical Exam Vitals and nursing note reviewed.  Constitutional:      Appearance: He is well-developed.  HENT:     Head: Normocephalic and atraumatic.  Eyes:     Conjunctiva/sclera: Conjunctivae normal.  Pulmonary:     Effort: No respiratory distress.  Abdominal:     Tenderness: There is no abdominal tenderness. There is no guarding or rebound.  Musculoskeletal:     Cervical back: Normal range of motion and neck supple.     Comments: Dialysis access left upper extremity, bandaged.  Skin:    General: Skin is warm and dry.  Neurological:     Mental Status: He is alert.     ED Results / Procedures / Treatments   Labs (all labs ordered are listed, but only abnormal results are displayed) Labs Reviewed  URINALYSIS, W/ REFLEX TO CULTURE (INFECTION SUSPECTED) - Abnormal; Notable for the following components:      Result Value   APPearance CLOUDY (*)    Hgb urine dipstick LARGE (*)    Protein, ur >300 (*)    Leukocytes,Ua LARGE (*)    Bacteria, UA RARE (*)    All other components within normal limits  URINE CULTURE  GC/CHLAMYDIA PROBE  AMP ([place]) NOT AT [facility]    EKG None  Radiology No results found.  Procedures Procedures    Medications Ordered in ED Medications  cefTRIAXone (ROCEPHIN) injection 500 mg (500 mg Intramuscular Given 04/13/22 1549)  sterile water (preservative free) injection (1 mL  Given 04/13/22 1549)    ED Course/ Medical Decision Making/ A&P    Patient seen and examined. History obtained directly from patient. Work-up including labs, imaging, EKG ordered in triage, if performed, were reviewed.    Labs/EKG: Independently reviewed and interpreted.  This included: UA shows white and red blood cells. Agree with GC/chlamydia testing which was sent and urine culture which is pending as well.  Imaging: None ordered.  Considered CT imaging to  evaluate for possibility of kidney stone or pyelonephritis, however patient symptoms are not suggestive of this at this time.  Medications/Fluids: IM Rocephin, ensured appropriate renal dose per up to date. No dosage changes advised.   Most recent vital signs reviewed and are as follows: BP 127/88 (BP Location: Right Arm)   Pulse 97   Temp 97.8 F (36.6 C) (Temporal)   Resp 16   Wt 115.2 kg   SpO2 97%   BMI 32.61 kg/m   Initial impression: Cystitis/urethritis  Home treatment plan: Will cover for both gonorrhea and chlamydia and cystitis.  Patient will be given doxycycline.  Per up-to-date, no renal dosage adjustments.  Will also be started on Keflex.  Per up-to-date, 500 mg daily, after dialysis on dialysis days.  Return instructions discussed with patient: Return with worsening pain, fever, vomiting  Follow-up instructions discussed with patient: Follow-up with PCP in 5 to 7 days for follow-up on culture results.                            Medical Decision Making Risk Prescription drug management.   Patient with dysuria, hematuria today.  Concern for STI versus uncomplicated cystitis.  Patient without fever, tachycardia, other signs of sepsis.  He does not have flank pain, vomiting or fever to suggest pyelonephritis.  Overall low concern for prostatitis, however this is possible as well.  The patient's vital signs, pertinent lab work and imaging were reviewed and interpreted as discussed in the ED course. Hospitalization was considered for further testing, treatments, or serial exams/observation. However as patient is well-appearing, has a stable exam, and reassuring studies today, I do not feel that they warrant admission at this time. This plan was discussed with the patient who verbalizes agreement and comfort with this plan and seems reliable and able to return to the Emergency Department with worsening or changing symptoms.          Final Clinical Impression(s) / ED  Diagnoses Final diagnoses:  Dysuria  Acute cystitis with hematuria    Rx / DC Orders ED Discharge Orders          Ordered    cephALEXin (KEFLEX) 500 MG capsule  Daily        04/13/22 1546    doxycycline (VIBRAMYCIN) 100 MG capsule  2 times daily        04/13/22 1546              Carlisle Cater, PA-C 04/13/22 1555    Blanchie Dessert, MD 04/14/22 0018

## 2022-04-13 NOTE — ED Triage Notes (Signed)
Pt arrives to ED with c/o dysuria x4 days and hematuria starting today. He notes receiving oral sex a week prior to symptoms.

## 2022-04-14 LAB — URINE CULTURE: Culture: NO GROWTH

## 2022-04-16 ENCOUNTER — Other Ambulatory Visit (HOSPITAL_COMMUNITY): Payer: Self-pay | Admitting: Physician Assistant

## 2022-04-16 DIAGNOSIS — L299 Pruritus, unspecified: Secondary | ICD-10-CM | POA: Diagnosis not present

## 2022-04-16 DIAGNOSIS — R209 Unspecified disturbances of skin sensation: Secondary | ICD-10-CM | POA: Diagnosis not present

## 2022-04-16 DIAGNOSIS — N2581 Secondary hyperparathyroidism of renal origin: Secondary | ICD-10-CM | POA: Diagnosis not present

## 2022-04-16 DIAGNOSIS — R110 Nausea: Secondary | ICD-10-CM | POA: Diagnosis not present

## 2022-04-16 DIAGNOSIS — Z992 Dependence on renal dialysis: Secondary | ICD-10-CM | POA: Diagnosis not present

## 2022-04-16 DIAGNOSIS — D631 Anemia in chronic kidney disease: Secondary | ICD-10-CM | POA: Diagnosis not present

## 2022-04-16 DIAGNOSIS — D689 Coagulation defect, unspecified: Secondary | ICD-10-CM | POA: Diagnosis not present

## 2022-04-16 DIAGNOSIS — N186 End stage renal disease: Secondary | ICD-10-CM | POA: Diagnosis not present

## 2022-04-16 DIAGNOSIS — D509 Iron deficiency anemia, unspecified: Secondary | ICD-10-CM | POA: Diagnosis not present

## 2022-04-16 DIAGNOSIS — R52 Pain, unspecified: Secondary | ICD-10-CM | POA: Diagnosis not present

## 2022-04-16 LAB — GC/CHLAMYDIA PROBE AMP (~~LOC~~) NOT AT ARMC
Chlamydia: NEGATIVE
Comment: NEGATIVE
Comment: NORMAL
Neisseria Gonorrhea: NEGATIVE

## 2022-04-17 ENCOUNTER — Ambulatory Visit (HOSPITAL_COMMUNITY)
Admission: RE | Admit: 2022-04-17 | Discharge: 2022-04-17 | Disposition: A | Discharge status: Home / Self Care | Payer: 59 | Source: Ambulatory Visit | Attending: Nephrology | Admitting: Nephrology

## 2022-04-18 DIAGNOSIS — R110 Nausea: Secondary | ICD-10-CM | POA: Diagnosis not present

## 2022-04-18 DIAGNOSIS — X58XXXA Exposure to other specified factors, initial encounter: Secondary | ICD-10-CM | POA: Diagnosis not present

## 2022-04-18 DIAGNOSIS — N2581 Secondary hyperparathyroidism of renal origin: Secondary | ICD-10-CM | POA: Diagnosis not present

## 2022-04-18 DIAGNOSIS — T82510A Breakdown (mechanical) of surgically created arteriovenous fistula, initial encounter: Secondary | ICD-10-CM | POA: Diagnosis not present

## 2022-04-18 DIAGNOSIS — Y999 Unspecified external cause status: Secondary | ICD-10-CM | POA: Diagnosis not present

## 2022-04-18 DIAGNOSIS — Z992 Dependence on renal dialysis: Secondary | ICD-10-CM | POA: Diagnosis not present

## 2022-04-18 DIAGNOSIS — D509 Iron deficiency anemia, unspecified: Secondary | ICD-10-CM | POA: Diagnosis not present

## 2022-04-18 DIAGNOSIS — T82858A Stenosis of vascular prosthetic devices, implants and grafts, initial encounter: Secondary | ICD-10-CM | POA: Diagnosis not present

## 2022-04-18 DIAGNOSIS — R209 Unspecified disturbances of skin sensation: Secondary | ICD-10-CM | POA: Diagnosis not present

## 2022-04-18 DIAGNOSIS — R52 Pain, unspecified: Secondary | ICD-10-CM | POA: Diagnosis not present

## 2022-04-18 DIAGNOSIS — T82898A Other specified complication of vascular prosthetic devices, implants and grafts, initial encounter: Secondary | ICD-10-CM | POA: Diagnosis not present

## 2022-04-18 DIAGNOSIS — D631 Anemia in chronic kidney disease: Secondary | ICD-10-CM | POA: Diagnosis not present

## 2022-04-18 DIAGNOSIS — D689 Coagulation defect, unspecified: Secondary | ICD-10-CM | POA: Diagnosis not present

## 2022-04-18 DIAGNOSIS — N186 End stage renal disease: Secondary | ICD-10-CM | POA: Diagnosis not present

## 2022-04-18 DIAGNOSIS — L299 Pruritus, unspecified: Secondary | ICD-10-CM | POA: Diagnosis not present

## 2022-04-19 ENCOUNTER — Other Ambulatory Visit: Payer: Self-pay | Admitting: Physician Assistant

## 2022-04-19 DIAGNOSIS — R9431 Abnormal electrocardiogram [ECG] [EKG]: Secondary | ICD-10-CM | POA: Diagnosis not present

## 2022-04-19 DIAGNOSIS — Z01810 Encounter for preprocedural cardiovascular examination: Secondary | ICD-10-CM | POA: Diagnosis not present

## 2022-04-20 ENCOUNTER — Telehealth: Payer: Self-pay | Admitting: Student

## 2022-04-20 DIAGNOSIS — N2581 Secondary hyperparathyroidism of renal origin: Secondary | ICD-10-CM | POA: Diagnosis not present

## 2022-04-20 DIAGNOSIS — R110 Nausea: Secondary | ICD-10-CM | POA: Diagnosis not present

## 2022-04-20 DIAGNOSIS — L299 Pruritus, unspecified: Secondary | ICD-10-CM | POA: Diagnosis not present

## 2022-04-20 DIAGNOSIS — D689 Coagulation defect, unspecified: Secondary | ICD-10-CM | POA: Diagnosis not present

## 2022-04-20 DIAGNOSIS — R52 Pain, unspecified: Secondary | ICD-10-CM | POA: Diagnosis not present

## 2022-04-20 DIAGNOSIS — Z992 Dependence on renal dialysis: Secondary | ICD-10-CM | POA: Diagnosis not present

## 2022-04-20 DIAGNOSIS — N186 End stage renal disease: Secondary | ICD-10-CM | POA: Diagnosis not present

## 2022-04-20 DIAGNOSIS — D631 Anemia in chronic kidney disease: Secondary | ICD-10-CM | POA: Diagnosis not present

## 2022-04-20 DIAGNOSIS — R209 Unspecified disturbances of skin sensation: Secondary | ICD-10-CM | POA: Diagnosis not present

## 2022-04-20 MED ORDER — PREDNISONE 50 MG PO TABS: ORAL_TABLET | ORAL | 3 refills | Status: DC

## 2022-04-20 NOTE — Telephone Encounter (Signed)
Prescription for prednisone called into patient's preferred pharmacy for premedication prior to planned fistulagram 05/01/22.  Attempted to notify patient however no answer, no identifiable VM.   Brynda Greathouse, MS RD PA-C

## 2022-04-23 DIAGNOSIS — R209 Unspecified disturbances of skin sensation: Secondary | ICD-10-CM | POA: Diagnosis not present

## 2022-04-23 DIAGNOSIS — D689 Coagulation defect, unspecified: Secondary | ICD-10-CM | POA: Diagnosis not present

## 2022-04-23 DIAGNOSIS — R52 Pain, unspecified: Secondary | ICD-10-CM | POA: Diagnosis not present

## 2022-04-23 DIAGNOSIS — D631 Anemia in chronic kidney disease: Secondary | ICD-10-CM | POA: Diagnosis not present

## 2022-04-23 DIAGNOSIS — N186 End stage renal disease: Secondary | ICD-10-CM | POA: Diagnosis not present

## 2022-04-23 DIAGNOSIS — L299 Pruritus, unspecified: Secondary | ICD-10-CM | POA: Diagnosis not present

## 2022-04-23 DIAGNOSIS — N2581 Secondary hyperparathyroidism of renal origin: Secondary | ICD-10-CM | POA: Diagnosis not present

## 2022-04-23 DIAGNOSIS — Z992 Dependence on renal dialysis: Secondary | ICD-10-CM | POA: Diagnosis not present

## 2022-04-23 DIAGNOSIS — R110 Nausea: Secondary | ICD-10-CM | POA: Diagnosis not present

## 2022-04-25 DIAGNOSIS — Z992 Dependence on renal dialysis: Secondary | ICD-10-CM | POA: Diagnosis not present

## 2022-04-25 DIAGNOSIS — N2581 Secondary hyperparathyroidism of renal origin: Secondary | ICD-10-CM | POA: Diagnosis not present

## 2022-04-25 DIAGNOSIS — R209 Unspecified disturbances of skin sensation: Secondary | ICD-10-CM | POA: Diagnosis not present

## 2022-04-25 DIAGNOSIS — L299 Pruritus, unspecified: Secondary | ICD-10-CM | POA: Diagnosis not present

## 2022-04-25 DIAGNOSIS — N186 End stage renal disease: Secondary | ICD-10-CM | POA: Diagnosis not present

## 2022-04-25 DIAGNOSIS — R52 Pain, unspecified: Secondary | ICD-10-CM | POA: Diagnosis not present

## 2022-04-25 DIAGNOSIS — D689 Coagulation defect, unspecified: Secondary | ICD-10-CM | POA: Diagnosis not present

## 2022-04-25 DIAGNOSIS — R110 Nausea: Secondary | ICD-10-CM | POA: Diagnosis not present

## 2022-04-25 DIAGNOSIS — D631 Anemia in chronic kidney disease: Secondary | ICD-10-CM | POA: Diagnosis not present

## 2022-04-26 ENCOUNTER — Other Ambulatory Visit: Payer: Self-pay | Admitting: Radiology

## 2022-04-27 DIAGNOSIS — N186 End stage renal disease: Secondary | ICD-10-CM | POA: Diagnosis not present

## 2022-04-27 DIAGNOSIS — R110 Nausea: Secondary | ICD-10-CM | POA: Diagnosis not present

## 2022-04-27 DIAGNOSIS — Z992 Dependence on renal dialysis: Secondary | ICD-10-CM | POA: Diagnosis not present

## 2022-04-27 DIAGNOSIS — D689 Coagulation defect, unspecified: Secondary | ICD-10-CM | POA: Diagnosis not present

## 2022-04-27 DIAGNOSIS — L299 Pruritus, unspecified: Secondary | ICD-10-CM | POA: Diagnosis not present

## 2022-04-27 DIAGNOSIS — D631 Anemia in chronic kidney disease: Secondary | ICD-10-CM | POA: Diagnosis not present

## 2022-04-27 DIAGNOSIS — N2581 Secondary hyperparathyroidism of renal origin: Secondary | ICD-10-CM | POA: Diagnosis not present

## 2022-04-27 DIAGNOSIS — R209 Unspecified disturbances of skin sensation: Secondary | ICD-10-CM | POA: Diagnosis not present

## 2022-04-27 DIAGNOSIS — R52 Pain, unspecified: Secondary | ICD-10-CM | POA: Diagnosis not present

## 2022-04-30 ENCOUNTER — Other Ambulatory Visit: Payer: Self-pay | Admitting: Internal Medicine

## 2022-04-30 DIAGNOSIS — R52 Pain, unspecified: Secondary | ICD-10-CM | POA: Diagnosis not present

## 2022-04-30 DIAGNOSIS — Z992 Dependence on renal dialysis: Secondary | ICD-10-CM | POA: Diagnosis not present

## 2022-04-30 DIAGNOSIS — D689 Coagulation defect, unspecified: Secondary | ICD-10-CM | POA: Diagnosis not present

## 2022-04-30 DIAGNOSIS — L299 Pruritus, unspecified: Secondary | ICD-10-CM | POA: Diagnosis not present

## 2022-04-30 DIAGNOSIS — N2581 Secondary hyperparathyroidism of renal origin: Secondary | ICD-10-CM | POA: Diagnosis not present

## 2022-04-30 DIAGNOSIS — R209 Unspecified disturbances of skin sensation: Secondary | ICD-10-CM | POA: Diagnosis not present

## 2022-04-30 DIAGNOSIS — N186 End stage renal disease: Secondary | ICD-10-CM | POA: Diagnosis not present

## 2022-04-30 DIAGNOSIS — R110 Nausea: Secondary | ICD-10-CM | POA: Diagnosis not present

## 2022-04-30 DIAGNOSIS — D631 Anemia in chronic kidney disease: Secondary | ICD-10-CM | POA: Diagnosis not present

## 2022-05-01 ENCOUNTER — Encounter (HOSPITAL_COMMUNITY): Payer: Self-pay

## 2022-05-01 ENCOUNTER — Ambulatory Visit (HOSPITAL_COMMUNITY)
Admission: RE | Admit: 2022-05-01 | Discharge: 2022-05-01 | Disposition: A | Discharge status: Home / Self Care | Payer: 59 | Source: Ambulatory Visit | Attending: Nephrology | Admitting: Nephrology

## 2022-05-01 ENCOUNTER — Other Ambulatory Visit: Payer: Self-pay

## 2022-05-01 ENCOUNTER — Other Ambulatory Visit (HOSPITAL_COMMUNITY): Payer: Self-pay | Admitting: Nephrology

## 2022-05-01 ENCOUNTER — Other Ambulatory Visit: Payer: Self-pay | Admitting: Radiology

## 2022-05-01 DIAGNOSIS — T82590A Other mechanical complication of surgically created arteriovenous fistula, initial encounter: Secondary | ICD-10-CM | POA: Diagnosis not present

## 2022-05-01 DIAGNOSIS — N186 End stage renal disease: Secondary | ICD-10-CM | POA: Insufficient documentation

## 2022-05-01 DIAGNOSIS — Z992 Dependence on renal dialysis: Secondary | ICD-10-CM | POA: Insufficient documentation

## 2022-05-01 DIAGNOSIS — I120 Hypertensive chronic kidney disease with stage 5 chronic kidney disease or end stage renal disease: Secondary | ICD-10-CM | POA: Diagnosis not present

## 2022-05-01 DIAGNOSIS — T82858A Stenosis of vascular prosthetic devices, implants and grafts, initial encounter: Secondary | ICD-10-CM | POA: Insufficient documentation

## 2022-05-01 DIAGNOSIS — Z87891 Personal history of nicotine dependence: Secondary | ICD-10-CM | POA: Diagnosis not present

## 2022-05-01 DIAGNOSIS — Y841 Kidney dialysis as the cause of abnormal reaction of the patient, or of later complication, without mention of misadventure at the time of the procedure: Secondary | ICD-10-CM | POA: Diagnosis not present

## 2022-05-01 HISTORY — PX: IR AV DIALY SHUNT INTRO NEEDLE/INTRACATH INITIAL W/PTA/IMG LEFT: IMG6103

## 2022-05-01 HISTORY — PX: IR US GUIDE VASC ACCESS LEFT: IMG2389

## 2022-05-01 MED ORDER — MIDAZOLAM HCL 2 MG/2ML IJ SOLN
INTRAMUSCULAR | Status: AC | PRN
  Administered 2022-05-01: 1 mg via INTRAVENOUS

## 2022-05-01 MED ORDER — MIDAZOLAM HCL 2 MG/2ML IJ SOLN
INTRAMUSCULAR | Status: AC
  Filled 2022-05-01: qty 4

## 2022-05-01 MED ORDER — FENTANYL CITRATE (PF) 100 MCG/2ML IJ SOLN
INTRAMUSCULAR | Status: AC
  Filled 2022-05-01: qty 2

## 2022-05-01 MED ORDER — HEPARIN SODIUM (PORCINE) 1000 UNIT/ML IJ SOLN
INTRAMUSCULAR | Status: AC
  Filled 2022-05-01: qty 10

## 2022-05-01 MED ORDER — SODIUM CHLORIDE 0.9 % IV SOLN: INTRAVENOUS | Status: DC

## 2022-05-01 MED ORDER — FENTANYL CITRATE (PF) 100 MCG/2ML IJ SOLN
INTRAMUSCULAR | Status: AC | PRN
  Administered 2022-05-01: 50 ug via INTRAVENOUS

## 2022-05-01 MED ORDER — LIDOCAINE HCL 1 % IJ SOLN
INTRAMUSCULAR | Status: AC
  Administered 2022-05-01: 10 mL
  Filled 2022-05-01: qty 20

## 2022-05-01 MED ORDER — IOHEXOL 300 MG/ML  SOLN
50.0000 mL | Freq: Once | INTRAMUSCULAR | Status: AC | PRN
  Administered 2022-05-01: 20 mL via INTRA_ARTERIAL

## 2022-05-01 NOTE — H&P (Signed)
Chief Complaint: Patient was seen in consultation today for left arm dialysis fistula evaluation/possible intervention at the request of [place]  Referring Physician(s): Corliss [place]  Supervising Physician: Mir, Sharen Heck  Patient Status: [facility] - Out-pt  History of Present Illness: Michael [name] is a 47 y.o. male   ESRD Left arm dialysis fistula placed 03/2021- Dr Rosalia Hammers Last use Monday - 04/28/22 Slow flow per notes  Pt is scheduled for left arm dialysis evaluation with possible intervention  Code status verified as FULL CODE   Past Medical History:  Diagnosis Date   GERD (gastroesophageal reflux disease)    Gout    History of renal dialysis    M-W-F   HLD (hyperlipidemia)    Hypertension    Intracranial aneurysm    s/p coil embolization: right middle meningeal artery 09/08/20, pericallosal artery 09/22/20, right MCA 12/22/20   Pneumonia    Polycystic kidney disease    SDH (subdural hematoma) ([place])    subacute right SDH 08/23/20 CTA head   Sleep apnea 07/29/2018   uses cpap nightly   Vitamin D deficiency 11/2018    Past Surgical History:  Procedure Laterality Date   AV FISTULA PLACEMENT Left 03/24/2020   Procedure: INSERTION OF LEFT UPPER EXTREMITY ARTERIOVENOUS (AV) GORE-TEX GRAFT;  Surgeon: [name], MD;  Location: [place];  Service: Vascular;  Laterality: Left;  PERIPHERAL NERVE BLOCK   [place] Left 12/11/2019   Procedure: 1ST STAGE BASILIC TRANSPOSITION OF LEFT ARM;  Surgeon: Angelia Mould, MD;  Location: [place];  Service: Vascular;  Laterality: Left;   BIOPSY  04/06/2021   Procedure: BIOPSY;  Surgeon: Thornton Park, MD;  Location: [facility];  Service: Gastroenterology;;   COLONOSCOPY WITH PROPOFOL N/A 04/06/2021   Procedure: COLONOSCOPY WITH PROPOFOL;  Surgeon: Thornton Park, MD;  Location: [facility];  Service: Gastroenterology;  Laterality: N/A;   COLONOSCOPY WITH PROPOFOL N/A 05/29/2021    Procedure: COLONOSCOPY WITH PROPOFOL;  Surgeon: Rush Landmark Telford Nab., MD;  Location: [facility];  Service: Gastroenterology;  Laterality: N/A;   DG AV DIALYSIS GRAFT DECLOT OR Left 05/17/2020   ENDOSCOPIC MUCOSAL RESECTION  05/29/2021   Procedure: ENDOSCOPIC MUCOSAL RESECTION;  Surgeon: Rush Landmark Telford Nab., MD;  Location: [facility];  Service: Gastroenterology;;   HEMOSTASIS CLIP PLACEMENT  05/29/2021   Procedure: HEMOSTASIS CLIP PLACEMENT;  Surgeon: Irving Copas., MD;  Location: [facility];  Service: Gastroenterology;;   IR FLUORO GUIDE CV LINE RIGHT  12/09/2019   IR US GUIDE VASC ACCESS RIGHT  12/09/2019   LEFT HEART CATH AND CORONARY ANGIOGRAPHY N/A 12/09/2019   Procedure: LEFT HEART CATH AND CORONARY ANGIOGRAPHY;  Surgeon: Adrian Prows, MD;  Location: [place] CV LAB;  Service: Cardiovascular;  Laterality: N/A;   POLYPECTOMY  04/06/2021   Procedure: POLYPECTOMY;  Surgeon: Thornton Park, MD;  Location: [facility];  Service: Gastroenterology;;   POLYPECTOMY  05/29/2021   Procedure: POLYPECTOMY;  Surgeon: Irving Copas., MD;  Location: [facility];  Service: Gastroenterology;;   REVISION OF ARTERIOVENOUS GORETEX GRAFT Left 08/31/2021   Procedure: REDO LEFT UPPER ARM ARTERIOVENOUS GORETEX GRAFT;  Surgeon: Broadus John, MD;  Location: [place];  Service: Vascular;  Laterality: Left;  [place] INJECTION  05/29/2021   Procedure: SUBMUCOSAL INJECTION;  Surgeon: Irving Copas., MD;  Location: [facility];  Service: Gastroenterology;;  EPI   SUBMUCOSAL LIFTING INJECTION  05/29/2021   Procedure: SUBMUCOSAL LIFTING INJECTION;  Surgeon: Irving Copas., MD;  Location: [facility];  Service:  Gastroenterology;;  [place]   SUBMUCOSAL TATTOO INJECTION  04/06/2021   Procedure: SUBMUCOSAL TATTOO INJECTION;  Surgeon: Thornton Park, MD;  Location: [facility];  Service: Gastroenterology;;   UPPER EXTREMITY VENOGRAPHY Bilateral  08/24/2021   Procedure: UPPER EXTREMITY VENOGRAPHY;  Surgeon: Marty Heck, MD;  Location: [place] CV LAB;  Service: Cardiovascular;  Laterality: Bilateral;    Allergies: Ivp dye [iodinated contrast media]  Medications: Prior to Admission medications   Medication Sig Start Date End Date Taking? Authorizing Provider  cephALEXin (KEFLEX) 500 MG capsule Take 1 capsule (500 mg total) by mouth daily. Administer after hemodialysis on dialysis days. 04/13/22   Carlisle Cater, PA-C  doxycycline (VIBRAMYCIN) 100 MG capsule Take 1 capsule (100 mg total) by mouth 2 (two) times daily. 04/13/22   Carlisle Cater, PA-C  eszopiclone (LUNESTA) 2 MG TABS tablet Take 1 tablet (2 mg total) by mouth at bedtime as needed for sleep. Take immediately before bedtime 02/20/22   Sherrilyn Rist A, MD  predniSONE (DELTASONE) 50 MG tablet To be taken 13 hours, 7 hours and 1 hour prior to Radiology procedure 2/13 04/20/22   Docia Barrier, PA  isosorbide dinitrate (ISORDIL) 10 MG tablet Take 1 tablet (10 mg total) by mouth 2 (two) times daily. Patient not taking: No sig reported 02/04/20 03/24/20  [name], FNP     Family History  Problem Relation Age of Onset   Diabetes Mother    Ovarian cancer Mother    Bipolar disorder Father    Hypertension Father    Prostate cancer Father    Prostate cancer Paternal Grandfather    Polycystic kidney disease Neg Hx     Social History   Socioeconomic History   Marital status: Single    Spouse name: Not on file   Number of children: 1   Years of education: Not on file   Highest education level: Not on file  Occupational History   Occupation: [employer]   Occupation: disabled  Tobacco Use   Smoking status: Former    Packs/day: 0.20    Years: 10.00    Total pack years: 2.00    Types: Cigarettes    Quit date: 2015    Years since quitting: 9.1   Smokeless tobacco: Never  Vaping Use   Vaping Use: Never used  Substance and Sexual Activity    Alcohol use: Not Currently    Comment: occasional wine   Drug use: No   Sexual activity: Yes  Other Topics Concern   Not on file  Social History Narrative   Not on file   Social Determinants of Health   Financial Resource Strain: High Risk (08/30/2020)   Overall Financial Resource Strain (CARDIA)    Difficulty of Paying Living Expenses: Very hard  Food Insecurity: Food Insecurity Present (10/19/2021)   Hunger Vital Sign    Worried About [place] in the Last Year: Often true    Ran Out of Food in the Last Year: Often true  Transportation Needs: No Transportation Needs (10/19/2021)   PRAPARE - Hydrologist (Medical): No    Lack of Transportation (Non-Medical): No  Physical Activity: Insufficiently Active (08/30/2020)   Exercise Vital Sign    Days of Exercise per Week: 3 days    Minutes of Exercise per Session: 30 min  Stress: Stress Concern Present (08/30/2020)   [place]    Feeling of Stress : To some extent  Social Connections: Moderately Integrated (08/30/2020)   Social Connection and Isolation Panel [NHANES]    Frequency of Communication with Friends and Family: More than three times a week    Frequency of Social Gatherings with Friends and Family: Once a week    Attends Religious Services: 1 to 4 times per year    Active Member of [employer] or Organizations: Yes    Attends Archivist Meetings: 1 to 4 times per year    Marital Status: Never married    Review of Systems: A 12 point ROS discussed and pertinent positives are indicated in the HPI above.  All other systems are negative.  Review of Systems  Constitutional:  Negative for activity change, fatigue and fever.  Respiratory:  Negative for cough and shortness of breath.   Cardiovascular:  Negative for chest pain.  Gastrointestinal:  Negative for abdominal pain.  Neurological:  Negative for weakness.   Psychiatric/Behavioral:  Negative for behavioral problems and confusion.     Vital Signs: BP (!) 120/92   Pulse 92   Temp (!) 97 F (36.1 C) (Temporal)   Resp 18   Ht 6' 2"$  (1.88 m)   Wt 255 lb (115.7 kg)   SpO2 96%   BMI 32.74 kg/m     Physical Exam Vitals reviewed.  HENT:     Mouth/Throat:     Mouth: Mucous membranes are moist.  Cardiovascular:     Rate and Rhythm: Normal rate and regular rhythm.     Heart sounds: Normal heart sounds.  Pulmonary:     Effort: Pulmonary effort is normal.     Breath sounds: Normal breath sounds.  Abdominal:     Palpations: Abdomen is soft.  Musculoskeletal:        General: Normal range of motion.     Comments: Left arm dialysis fistula- good pulse No thrill  Skin:    General: Skin is warm.  Neurological:     Mental Status: He is alert and oriented to person, place, and time.  Psychiatric:        Behavior: Behavior normal.     Imaging: No results found.  Labs:  CBC: Recent Labs    08/24/21 0957 08/31/21 0605 09/03/21 1244 01/11/22 2118  WBC  --   --  10.6* 8.3  HGB 12.6* 13.9 11.4* 14.1  HCT 37.0* 41.0 36.2* 49.1  PLT  --   --  197 180    COAGS: No results for input(s): "INR", "APTT" in the last 8760 hours.  BMP: Recent Labs    08/24/21 0957 08/31/21 0605 09/03/21 1244 01/11/22 2118  NA 137 135 138 139  K 5.5* 4.6 6.0* 4.8  CL 99 105 96* 100  CO2  --   --  27 24  GLUCOSE 86 98 81 140*  BUN 44* 40* 57* 46*  CALCIUM  --   --  9.4 9.9  CREATININE 10.30* 10.60* 13.64* 13.99*  GFRNONAA  --   --  4* 4*    LIVER FUNCTION TESTS: No results for input(s): "BILITOT", "AST", "ALT", "ALKPHOS", "PROT", "ALBUMIN" in the last 8760 hours.  TUMOR MARKERS: No results for input(s): "AFPTM", "CEA", "CA199", "CHROMGRNA" in the last 8760 hours.  Assessment and Plan:  ESRD Left arm fistula slow flow in dialysis Scheduled for evaluation of fistula with possible intervention Possible tunneled dialysis catheter  placement if needed Pt is aware of procedure benefits and risks Including but not limited to Infection; bleeding; vessel damage; damage to fistula or surrounding  structures Agreeable to proceed Consent signed    Thank you for this interesting consult.  I greatly enjoyed meeting [employer] and look forward to participating in their care.  A copy of this report was sent to the requesting provider on this date.  Electronically Signed: Lavonia Drafts, PA-C 05/01/2022, 12:15 PM   I spent a total of    25 Minutes in face to face in clinical consultation, greater than 50% of which was counseling/coordinating care for left arm fistulogram with possible intervention

## 2022-05-01 NOTE — Sedation Documentation (Signed)
Xrays for evaluation of left upper arm being completed prior to sedation.

## 2022-05-01 NOTE — Procedures (Signed)
Interventional Radiology Procedure Note  Procedure: Left upper extremity Fistulagram  Indication: Poor flow  Findings: Please refer to procedural dictation for full description.  Complications: None  EBL: < 10 mL  Miachel Roux, MD [phone]

## 2022-05-02 DIAGNOSIS — D631 Anemia in chronic kidney disease: Secondary | ICD-10-CM | POA: Diagnosis not present

## 2022-05-02 DIAGNOSIS — N2581 Secondary hyperparathyroidism of renal origin: Secondary | ICD-10-CM | POA: Diagnosis not present

## 2022-05-02 DIAGNOSIS — Z992 Dependence on renal dialysis: Secondary | ICD-10-CM | POA: Diagnosis not present

## 2022-05-02 DIAGNOSIS — R110 Nausea: Secondary | ICD-10-CM | POA: Diagnosis not present

## 2022-05-02 DIAGNOSIS — R52 Pain, unspecified: Secondary | ICD-10-CM | POA: Diagnosis not present

## 2022-05-02 DIAGNOSIS — L299 Pruritus, unspecified: Secondary | ICD-10-CM | POA: Diagnosis not present

## 2022-05-02 DIAGNOSIS — D689 Coagulation defect, unspecified: Secondary | ICD-10-CM | POA: Diagnosis not present

## 2022-05-02 DIAGNOSIS — R209 Unspecified disturbances of skin sensation: Secondary | ICD-10-CM | POA: Diagnosis not present

## 2022-05-02 DIAGNOSIS — N186 End stage renal disease: Secondary | ICD-10-CM | POA: Diagnosis not present

## 2022-05-04 DIAGNOSIS — N2581 Secondary hyperparathyroidism of renal origin: Secondary | ICD-10-CM | POA: Diagnosis not present

## 2022-05-04 DIAGNOSIS — R110 Nausea: Secondary | ICD-10-CM | POA: Diagnosis not present

## 2022-05-04 DIAGNOSIS — R52 Pain, unspecified: Secondary | ICD-10-CM | POA: Diagnosis not present

## 2022-05-04 DIAGNOSIS — N186 End stage renal disease: Secondary | ICD-10-CM | POA: Diagnosis not present

## 2022-05-04 DIAGNOSIS — D631 Anemia in chronic kidney disease: Secondary | ICD-10-CM | POA: Diagnosis not present

## 2022-05-04 DIAGNOSIS — D689 Coagulation defect, unspecified: Secondary | ICD-10-CM | POA: Diagnosis not present

## 2022-05-04 DIAGNOSIS — L299 Pruritus, unspecified: Secondary | ICD-10-CM | POA: Diagnosis not present

## 2022-05-04 DIAGNOSIS — R209 Unspecified disturbances of skin sensation: Secondary | ICD-10-CM | POA: Diagnosis not present

## 2022-05-04 DIAGNOSIS — Z992 Dependence on renal dialysis: Secondary | ICD-10-CM | POA: Diagnosis not present

## 2022-05-07 DIAGNOSIS — L299 Pruritus, unspecified: Secondary | ICD-10-CM | POA: Diagnosis not present

## 2022-05-07 DIAGNOSIS — D631 Anemia in chronic kidney disease: Secondary | ICD-10-CM | POA: Diagnosis not present

## 2022-05-07 DIAGNOSIS — R209 Unspecified disturbances of skin sensation: Secondary | ICD-10-CM | POA: Diagnosis not present

## 2022-05-07 DIAGNOSIS — N186 End stage renal disease: Secondary | ICD-10-CM | POA: Diagnosis not present

## 2022-05-07 DIAGNOSIS — R110 Nausea: Secondary | ICD-10-CM | POA: Diagnosis not present

## 2022-05-07 DIAGNOSIS — R52 Pain, unspecified: Secondary | ICD-10-CM | POA: Diagnosis not present

## 2022-05-07 DIAGNOSIS — Z992 Dependence on renal dialysis: Secondary | ICD-10-CM | POA: Diagnosis not present

## 2022-05-07 DIAGNOSIS — G4733 Obstructive sleep apnea (adult) (pediatric): Secondary | ICD-10-CM | POA: Diagnosis not present

## 2022-05-07 DIAGNOSIS — D689 Coagulation defect, unspecified: Secondary | ICD-10-CM | POA: Diagnosis not present

## 2022-05-07 DIAGNOSIS — N2581 Secondary hyperparathyroidism of renal origin: Secondary | ICD-10-CM | POA: Diagnosis not present

## 2022-05-08 ENCOUNTER — Telehealth (HOSPITAL_BASED_OUTPATIENT_CLINIC_OR_DEPARTMENT_OTHER): Payer: Self-pay | Admitting: Family Medicine

## 2022-05-08 ENCOUNTER — Encounter: Payer: Self-pay | Admitting: Gastroenterology

## 2022-05-08 NOTE — Telephone Encounter (Signed)
Contacted Michael [name] to schedule their annual wellness visit. Appointment made for 05/15/2022.  Thank you,  Angelina Direct dial  [phone]

## 2022-05-09 ENCOUNTER — Other Ambulatory Visit: Payer: Self-pay

## 2022-05-09 ENCOUNTER — Telehealth: Payer: Self-pay

## 2022-05-09 DIAGNOSIS — D631 Anemia in chronic kidney disease: Secondary | ICD-10-CM | POA: Diagnosis not present

## 2022-05-09 DIAGNOSIS — D125 Benign neoplasm of sigmoid colon: Secondary | ICD-10-CM

## 2022-05-09 DIAGNOSIS — R209 Unspecified disturbances of skin sensation: Secondary | ICD-10-CM | POA: Diagnosis not present

## 2022-05-09 DIAGNOSIS — R52 Pain, unspecified: Secondary | ICD-10-CM | POA: Diagnosis not present

## 2022-05-09 DIAGNOSIS — N186 End stage renal disease: Secondary | ICD-10-CM | POA: Diagnosis not present

## 2022-05-09 DIAGNOSIS — N2581 Secondary hyperparathyroidism of renal origin: Secondary | ICD-10-CM | POA: Diagnosis not present

## 2022-05-09 DIAGNOSIS — Z992 Dependence on renal dialysis: Secondary | ICD-10-CM | POA: Diagnosis not present

## 2022-05-09 DIAGNOSIS — D689 Coagulation defect, unspecified: Secondary | ICD-10-CM | POA: Diagnosis not present

## 2022-05-09 DIAGNOSIS — L299 Pruritus, unspecified: Secondary | ICD-10-CM | POA: Diagnosis not present

## 2022-05-09 DIAGNOSIS — R110 Nausea: Secondary | ICD-10-CM | POA: Diagnosis not present

## 2022-05-09 MED ORDER — NA SULFATE-K SULFATE-MG SULF 17.5-3.13-1.6 GM/177ML PO SOLN: 1.0000 | Freq: Once | ORAL | 0 refills | Status: AC

## 2022-05-09 NOTE — Telephone Encounter (Signed)
Yevette Edwards, RN  [place], Jaxxson Cavanah L, RN Can schedule in any open slot per [place]. Thanks       Previous Messages    ----- Message ----- From: Timothy Lasso, RN Sent: 05/08/2022   5:01 PM EST To: Yevette Edwards, RN  Am I suppose to see if GM can do it? ----- Message ----- From: Yevette Edwards, RN Sent: 05/08/2022   4:56 PM EST To: Timothy Lasso, RN  I tried to help. Please see note below. ----- Message ----- From: Horris Latino, CMA Sent: 05/08/2022   4:53 PM EST To: Yevette Edwards, RN  Dr. Tarri Glenn is leaving in April and her hospital dates are full. I do not know who her patient are going to. Kaira and Ammie have kept the hospital list previously. ----- Message ----- From: Yevette Edwards, RN Sent: 05/08/2022   4:49 PM EST To: [place], can patient be placed on a wait list or does she have availability to be scheduled at a later date? ----- Message ----- From: Horris Latino, CMA Sent: 05/08/2022   2:12 PM EST To: Yevette Edwards, RN  As of yesterday Dr. Tarri Glenn is out of hospital cases.  Nira Conn, CMA ----- Message ----- From: Yevette Edwards, RN Sent: 05/08/2022  12:41 PM EST To: Horris Latino, CMA  Heather, when you have a chance will you please contact this patient to schedule recall hospital colonoscopy. Thank you ----- Message ----- From: Thornton Park, MD Sent: 04/16/2022   8:47 PM EST To: Marice Potter, RN  Patient is due hospital surveillance colonoscopy in March. Please schedule.  Thanks.  KLB ----- Message ----- From: SYSTEM Sent: 04/09/2022   5:35 AM EST To: Thornton Park, MD

## 2022-05-09 NOTE — Telephone Encounter (Signed)
Appt made for 05/28/22 at 1030 am with Dr Fuller Plan for colon at [facility]  Left message on machine to call back

## 2022-05-09 NOTE — Telephone Encounter (Signed)
The pt has been moved to 4/11 with GM since he did the last colon.  The pt has been advised and new instructions mailed and sent to My Chart

## 2022-05-11 DIAGNOSIS — D689 Coagulation defect, unspecified: Secondary | ICD-10-CM | POA: Diagnosis not present

## 2022-05-11 DIAGNOSIS — D631 Anemia in chronic kidney disease: Secondary | ICD-10-CM | POA: Diagnosis not present

## 2022-05-11 DIAGNOSIS — N186 End stage renal disease: Secondary | ICD-10-CM | POA: Diagnosis not present

## 2022-05-11 DIAGNOSIS — R110 Nausea: Secondary | ICD-10-CM | POA: Diagnosis not present

## 2022-05-11 DIAGNOSIS — L299 Pruritus, unspecified: Secondary | ICD-10-CM | POA: Diagnosis not present

## 2022-05-11 DIAGNOSIS — N2581 Secondary hyperparathyroidism of renal origin: Secondary | ICD-10-CM | POA: Diagnosis not present

## 2022-05-11 DIAGNOSIS — R52 Pain, unspecified: Secondary | ICD-10-CM | POA: Diagnosis not present

## 2022-05-11 DIAGNOSIS — R209 Unspecified disturbances of skin sensation: Secondary | ICD-10-CM | POA: Diagnosis not present

## 2022-05-11 DIAGNOSIS — Z992 Dependence on renal dialysis: Secondary | ICD-10-CM | POA: Diagnosis not present

## 2022-05-14 DIAGNOSIS — N2581 Secondary hyperparathyroidism of renal origin: Secondary | ICD-10-CM | POA: Diagnosis not present

## 2022-05-14 DIAGNOSIS — D631 Anemia in chronic kidney disease: Secondary | ICD-10-CM | POA: Diagnosis not present

## 2022-05-14 DIAGNOSIS — L299 Pruritus, unspecified: Secondary | ICD-10-CM | POA: Diagnosis not present

## 2022-05-14 DIAGNOSIS — Z992 Dependence on renal dialysis: Secondary | ICD-10-CM | POA: Diagnosis not present

## 2022-05-14 DIAGNOSIS — R52 Pain, unspecified: Secondary | ICD-10-CM | POA: Diagnosis not present

## 2022-05-14 DIAGNOSIS — R110 Nausea: Secondary | ICD-10-CM | POA: Diagnosis not present

## 2022-05-14 DIAGNOSIS — N186 End stage renal disease: Secondary | ICD-10-CM | POA: Diagnosis not present

## 2022-05-14 DIAGNOSIS — R209 Unspecified disturbances of skin sensation: Secondary | ICD-10-CM | POA: Diagnosis not present

## 2022-05-14 DIAGNOSIS — D689 Coagulation defect, unspecified: Secondary | ICD-10-CM | POA: Diagnosis not present

## 2022-05-15 ENCOUNTER — Encounter (HOSPITAL_BASED_OUTPATIENT_CLINIC_OR_DEPARTMENT_OTHER): Payer: Self-pay

## 2022-05-15 ENCOUNTER — Ambulatory Visit (INDEPENDENT_AMBULATORY_CARE_PROVIDER_SITE_OTHER): Payer: 59

## 2022-05-15 VITALS — Ht 74.0 in | Wt 255.0 lb

## 2022-05-15 DIAGNOSIS — Z0000 Encounter for general adult medical examination without abnormal findings: Secondary | ICD-10-CM | POA: Diagnosis not present

## 2022-05-15 NOTE — Progress Notes (Addendum)
Subjective:   Michael [name] is a 47 y.o. male who presents for Medicare Annual/Subsequent preventive examination.  Review of Systems    Virtual Visit via Telephone Note  I connected with  Michael [name] on 05/15/22 at  9:45 AM EST by telephone and verified that I am speaking with the correct person using two identifiers.  Location: Patient: Home  Provider: office Persons participating in the virtual visit: patient/Nurse Health Advisor   I discussed the limitations, risks, security and privacy concerns of performing an evaluation and management service by telephone and the availability of in person appointments. The patient expressed understanding and agreed to proceed.  Interactive audio and video telecommunications were attempted between this nurse and patient, however failed, due to patient having technical difficulties OR patient did not have access to video capability.  We continued and completed visit with audio only.  Some vital signs may be absent or patient reported.   Criselda Peaches, LPN  Cardiac Risk Factors include: advanced age (>9mn, >>40women);male gender;hypertension     Objective:    Today's Vitals   05/15/22 0953  Weight: 255 lb (115.7 kg)  Height: '6\' 2"'$  (1.88 m)  PainSc: 0-No pain   Body mass index is 32.74 kg/m.     05/15/2022   10:01 AM 05/01/2022   12:26 PM 04/13/2022    2:01 PM 01/11/2022    9:05 PM 12/14/2021    4:07 AM 11/16/2021   10:19 AM 09/03/2021   11:12 AM  Advanced Directives  Does Patient Have a Medical Advance Directive? No No No No No No No  Would patient like information on creating a medical advance directive? No - Patient declined No - Patient declined  No - Patient declined No - Patient declined  No - Patient declined    Current Medications (verified) Outpatient Encounter Medications as of 05/15/2022  Medication Sig   cephALEXin (KEFLEX) 500 MG capsule Take 1 capsule (500 mg total) by mouth daily. Administer after hemodialysis on  dialysis days.   doxycycline (VIBRAMYCIN) 100 MG capsule Take 1 capsule (100 mg total) by mouth 2 (two) times daily.   eszopiclone (LUNESTA) 2 MG TABS tablet Take 1 tablet (2 mg total) by mouth at bedtime as needed for sleep. Take immediately before bedtime   predniSONE (DELTASONE) 50 MG tablet To be taken 13 hours, 7 hours and 1 hour prior to Radiology procedure 2/13   [DISCONTINUED] isosorbide dinitrate (ISORDIL) 10 MG tablet Take 1 tablet (10 mg total) by mouth 2 (two) times daily. (Patient not taking: No sig reported)   No facility-administered encounter medications on file as of 05/15/2022.    Allergies (verified) Ivp dye [iodinated contrast media]   History: Past Medical History:  Diagnosis Date   GERD (gastroesophageal reflux disease)    Gout    History of renal dialysis    M-W-F   HLD (hyperlipidemia)    Hypertension    Intracranial aneurysm    s/p coil embolization: right middle meningeal artery 09/08/20, pericallosal artery 09/22/20, right MCA 12/22/20   Pneumonia    Polycystic kidney disease    SDH (subdural hematoma) ([facility]    subacute right SDH 08/23/20 CTA head   Sleep apnea 07/29/2018   uses cpap nightly   Vitamin D deficiency 11/2018   Past Surgical History:  Procedure Laterality Date   AV FISTULA PLACEMENT Left 03/24/2020   Procedure: INSERTION OF LEFT UPPER EXTREMITY ARTERIOVENOUS (AV) GORE-TEX GRAFT;  Surgeon: HCherre Robins MD;  [facility]  Service: Vascular;  Laterality: Left;  PERIPHERAL NERVE BLOCK   BASCILIC VEIN TRANSPOSITION Left 12/11/2019   Procedure: 1ST STAGE BASILIC TRANSPOSITION OF LEFT ARM;  Surgeon: Angelia Mould, MD;  Location: [facility];  Service: Vascular;  Laterality: Left;   BIOPSY  04/06/2021   Procedure: BIOPSY;  Surgeon: Thornton Park, MD;  Location: [facility];  Service: Gastroenterology;;   COLONOSCOPY WITH PROPOFOL N/A 04/06/2021   Procedure: COLONOSCOPY WITH PROPOFOL;  Surgeon: Thornton Park, MD;  Location: [facility];  Service: Gastroenterology;  Laterality: N/A;   COLONOSCOPY WITH PROPOFOL N/A 05/29/2021   Procedure: COLONOSCOPY WITH PROPOFOL;  Surgeon: Rush Landmark Telford Nab., MD;  Location: [facility];  Service: Gastroenterology;  Laterality: N/A;   DG AV DIALYSIS GRAFT DECLOT OR Left 05/17/2020   ENDOSCOPIC MUCOSAL RESECTION  05/29/2021   Procedure: ENDOSCOPIC MUCOSAL RESECTION;  Surgeon: Rush Landmark Telford Nab., MD;  Location: [facility];  Service: Gastroenterology;;   HEMOSTASIS CLIP PLACEMENT  05/29/2021   Procedure: HEMOSTASIS CLIP PLACEMENT;  Surgeon: Irving Copas., MD;  Location: [facility];  Service: Gastroenterology;;   IR AV DIALY SHUNT INTRO [place] W/PTA/IMG LEFT  05/01/2022   IR FLUORO GUIDE CV LINE RIGHT  12/09/2019   IR US GUIDE VASC ACCESS LEFT  05/01/2022   IR US GUIDE VASC ACCESS RIGHT  12/09/2019   LEFT HEART CATH AND CORONARY ANGIOGRAPHY N/A 12/09/2019   Procedure: LEFT HEART CATH AND CORONARY ANGIOGRAPHY;  Surgeon: Adrian Prows, MD;  Location: [facility];  Service: Cardiovascular;  Laterality: N/A;   POLYPECTOMY  04/06/2021   Procedure: POLYPECTOMY;  Surgeon: Thornton Park, MD;  Location: [facility];  Service: Gastroenterology;;   POLYPECTOMY  05/29/2021   Procedure: POLYPECTOMY;  Surgeon: Irving Copas., MD;  Location: [facility];  Service: Gastroenterology;;   REVISION OF ARTERIOVENOUS GORETEX GRAFT Left 08/31/2021   Procedure: REDO LEFT UPPER ARM ARTERIOVENOUS GORETEX GRAFT;  Surgeon: Broadus John, MD;  Location: [facility];  Service: Vascular;  Laterality: Left;  [place] INJECTION  05/29/2021   Procedure: SUBMUCOSAL INJECTION;  Surgeon: Irving Copas., MD;  Location: [facility];  Service: Gastroenterology;;  EPI   SUBMUCOSAL LIFTING INJECTION  05/29/2021   Procedure: SUBMUCOSAL LIFTING INJECTION;  Surgeon: Irving Copas., MD;  Location: [facility];  Service:  Gastroenterology;;  [place] INJECTION  04/06/2021   Procedure: SUBMUCOSAL TATTOO INJECTION;  Surgeon: Thornton Park, MD;  Location: [facility];  Service: Gastroenterology;;   UPPER EXTREMITY VENOGRAPHY Bilateral 08/24/2021   Procedure: UPPER EXTREMITY VENOGRAPHY;  Surgeon: Marty Heck, MD;  Location: [facility];  Service: Cardiovascular;  Laterality: Bilateral;   Family History  Problem Relation Age of Onset   Diabetes Mother    Ovarian cancer Mother    Bipolar disorder Father    Hypertension Father    Prostate cancer Father    Prostate cancer Paternal Grandfather    Polycystic kidney disease Neg Hx    Social History   Socioeconomic History   Marital status: Single    Spouse name: Not on file   Number of children: 1   Years of education: Not on file   Highest education level: Not on file  Occupational History   Occupation: [employer]   Occupation: disabled  Tobacco Use   Smoking status: Former    Packs/day: 0.20    Years: 10.00    Total pack years: 2.00    Types: Cigarettes    Quit date: 2015  Years since quitting: 9.1   Smokeless tobacco: Never  Vaping Use   Vaping Use: Never used  Substance and Sexual Activity   Alcohol use: Not Currently    Comment: occasional wine   Drug use: No   Sexual activity: Yes  Other Topics Concern   Not on file  Social History Narrative   Not on file   Social Determinants of Health   Financial Resource Strain: Low Risk  (05/15/2022)   Overall Financial Resource Strain (CARDIA)    Difficulty of Paying Living Expenses: Not hard at all  Food Insecurity: No Food Insecurity (05/15/2022)   Hunger Vital Sign    Worried About Running Out of Food in the Last Year: Never true    Ran Out of Food in the Last Year: Never true  Transportation Needs: No Transportation Needs (05/15/2022)   PRAPARE - Hydrologist (Medical): No    Lack of Transportation (Non-Medical): No   Physical Activity: Insufficiently Active (05/15/2022)   Exercise Vital Sign    Days of Exercise per Week: 2 days    Minutes of Exercise per Session: 60 min  Stress: No Stress Concern Present (05/15/2022)   [place]    Feeling of Stress : Not at all  Social Connections: Moderately Integrated (05/15/2022)   Social Connection and Isolation Panel [NHANES]    Frequency of Communication with Friends and Family: More than three times a week    Frequency of Social Gatherings with Friends and Family: More than three times a week    Attends Religious Services: More than 4 times per year    Active Member of [employer] or Organizations: Yes    Attends Music therapist: More than 4 times per year    Marital Status: Never married    Tobacco Counseling Counseling given: Not Answered   Clinical Intake:  Pre-visit preparation completed: Yes  Pain : No/denies pain Pain Score: 0-No pain     BMI - recorded: 32.74 Nutritional Status: BMI > 30  Obese Nutritional Risks: None Diabetes: No  How often do you need to have someone help you when you read instructions, pamphlets, or other written materials from your doctor or pharmacy?: 1 - Never  Diabetic?  No  Interpreter Needed?: No  Information entered by :: Rolene Arbour LPN   Activities of Daily Living    05/15/2022    9:59 AM 04/05/2022   10:25 AM  In your present state of health, do you have any difficulty performing the following activities:  Hearing? 0 0  Vision? 0 0  Difficulty concentrating or making decisions? 0 0  Walking or climbing stairs? 0 0  Dressing or bathing? 0 0  Doing errands, shopping? 0 0  Preparing Food and eating ? N   Using the Toilet? N   In the past six months, have you accidently leaked urine? N   Do you have problems with loss of bowel control? N   Managing your Medications? N   Managing your Finances? N   Housekeeping or  managing your Housekeeping? N     Patient Care Team: [place], Blondell Reveal, MD as PCP - [facility] [place] any recent Medical Services you may have received from other than [facility] providers in the past year (date may be approximate).     Assessment:   This is a routine wellness examination for Shawn.  Hearing/Vision screen Hearing Screening -  Comments:: Denies hearing difficulties   Vision Screening - Comments:: Wears rx glasses - up to date with routine eye exams with  [facility]  Dietary issues and exercise activities discussed: Current Exercise Habits: Home exercise routine, Type of exercise: walking, Time (Minutes): 30, Frequency (Times/Week): 2, Weekly Exercise (Minutes/Week): 60, Intensity: Moderate, Exercise limited by: None identified   Goals Addressed               This Visit's Progress     Patient Stated (pt-stated)        I want to receive a kidney, get married and get my life back in order.       Depression Screen    05/15/2022    9:57 AM 04/05/2022   10:24 AM 07/28/2021    8:20 AM 08/30/2020    1:32 PM 02/16/2020   10:37 AM 11/24/2019    9:52 AM 10/19/2019   11:39 AM  PHQ 2/9 Scores  PHQ - 2 Score 0 2 0 0 1 2 0  PHQ- 9 Score 0 '7 3   6   '$ Exception Documentation  Medical reason         Fall Risk    05/15/2022    9:59 AM 04/05/2022   10:25 AM 07/28/2021    8:20 AM 08/30/2020    1:40 PM 02/16/2020   10:38 AM  Fall Risk   Falls in the past year? 0 0 0 0 1  Number falls in past yr: 0 0 0 0 1  Injury with Fall? 0 0 0 0 1  Risk for fall due to : No Fall Risks No Fall Risks No Fall Risks No Fall Risks   Follow up Falls prevention discussed Falls evaluation completed Falls evaluation completed      FALL RISK PREVENTION PERTAINING TO THE HOME:  Any stairs in or around the home? Yes  If so, are there any without handrails? No  Home free of loose throw rugs in walkways, pet beds, electrical cords, etc? Yes  Adequate  lighting in your home to reduce risk of falls? Yes   ASSISTIVE DEVICES UTILIZED TO PREVENT FALLS:  Life alert? Yes Use of a cane, walker or w/c? No  Grab bars in the bathroom? Yes  Shower chair or bench in shower? Yes  Elevated toilet seat or a handicapped toilet? No   TIMED UP AND GO:  Was the test performed? No . Audio Visit    Cognitive Function:        05/15/2022   10:01 AM  6CIT Screen  What Year? 0 points  What month? 0 points  What time? 0 points  Count back from 20 0 points  Months in reverse 0 points  Repeat phrase 0 points  Total Score 0 points    Immunizations Immunization History  Administered Date(s) Administered   Hepatitis A, Adult 08/23/2020   Influenza Inj Mdck Quad With Preservative 01/28/2018   Influenza Split 01/30/2017, 01/28/2018   Influenza,inj,Quad PF,6+ Mos 01/30/2017   Moderna Sars-Covid-2 Vaccination 08/18/2019, 09/16/2019   Pneumococcal Conjugate-13 08/23/2020    TDAP status: Due, Education has been provided regarding the importance of this vaccine. Advised may receive this vaccine at local pharmacy or Health Dept. Aware to provide a copy of the vaccination record if obtained from local pharmacy or Health Dept. Verbalized acceptance and understanding.  Flu Vaccine status: Up to date    Covid-19 vaccine status: Completed vaccines  Qualifies for Shingles Vaccine? No   Zostavax completed No  Shingrix Completed?: No.    Education has been provided regarding the importance of this vaccine. Patient has been advised to call insurance company to determine out of pocket expense if they have not yet received this vaccine. Advised may also receive vaccine at local pharmacy or Health Dept. Verbalized acceptance and understanding.  Screening Tests Health Maintenance  Topic Date Due   DTaP/Tdap/Td (1 - Tdap) Never done   COVID-19 Vaccine (3 - Moderna risk series) 05/31/2022 (Originally 10/14/2019)   Medicare Annual Wellness (AWV)  05/16/2023    COLONOSCOPY (Pts 45-46yr Insurance coverage will need to be confirmed)  05/30/2031   Hepatitis C Screening  Completed   HIV Screening  Completed   HPV VACCINES  Aged Out    Health Maintenance  Health Maintenance Due  Topic Date Due   DTaP/Tdap/Td (1 - Tdap) Never done    Colorectal cancer screening: Type of screening: Colonoscopy. Completed 05/29/21. Repeat every 10 years  Lung Cancer Screening: (Low Dose CT Chest recommended if Age 47-80years, 30 pack-year currently smoking OR have quit w/in 15years.) does qualify.   Lung Cancer Screening Referral: Deferred  Additional Screening:  Hepatitis C Screening: does qualify; Completed 12/08/09  Vision Screening: Recommended annual ophthalmology exams for early detection of glaucoma and other disorders of the eye. Is the patient up to date with their annual eye exam?  Yes  Who is the provider or what is the name of the office in which the patient attends annual eye exams? [facility] If pt is not established with a provider, would they like to be referred to a provider to establish care? No .   Dental Screening: Recommended annual dental exams for proper oral hygiene  Community Resource Referral / Chronic Care Management:  CRR required this visit?  No   CCM required this visit?  No      Plan:     I have personally reviewed and noted the following in the patient's chart:   Medical and social history Use of alcohol, tobacco or illicit drugs  Current medications and supplements including opioid prescriptions. Patient is not currently taking opioid prescriptions. Functional ability and status Nutritional status Physical activity Advanced directives List of other physicians Hospitalizations, surgeries, and ER visits in previous 12 months Vitals Screenings to include cognitive, depression, and falls Referrals and appointments  In addition, I have reviewed and discussed with patient certain preventive protocols, quality  metrics, and best practice recommendations. A written personalized care plan for preventive services as well as general preventive health recommendations were provided to patient.     BCriselda Peaches LPN   [id number]  Nurse Notes: None

## 2022-05-15 NOTE — Patient Instructions (Addendum)
[name] , Thank you for taking time to come for your Medicare Wellness Visit. I appreciate your ongoing commitment to your health goals. Please review the following plan we discussed and let me know if I can assist you in the future.   These are the goals we discussed:  Goals       DIET - REDUCE FAT INTAKE      Find a PCP      Care Coordination Interventions: Discussed the importance of seeing a Primary Care Provider Advised patient to review list provided by insurance provider to obtain PCP Provider contact number for physician referral [phone]       Patient Stated (pt-stated)      I want to receive a kidney, get married and get my life back in order.        This is a list of the screening recommended for you and due dates:  Health Maintenance  Topic Date Due   DTaP/Tdap/Td vaccine (1 - Tdap) Never done   COVID-19 Vaccine (3 - Moderna risk series) 05/31/2022*   Medicare Annual Wellness Visit  05/16/2023   Colon Cancer Screening  05/30/2031   Hepatitis C Screening: USPSTF Recommendation to screen - Ages 18-79 yo.  Completed   HIV Screening  Completed   HPV Vaccine  Aged Out  *Topic was postponed. The date shown is not the original due date.    Advanced directives: Advance directive discussed with you today. Even though you declined this today, please call our office should you change your mind, and we can give you the proper paperwork for you to fill out.   Conditions/risks identified: None  Next appointment: Follow up in one year for your annual wellness visit    Preventive Care 40-64 Years, Male Preventive care refers to lifestyle choices and visits with your health care provider that can promote health and wellness. What does preventive care include? A yearly physical exam. This is also called an annual well check. Dental exams once or twice a year. Routine eye exams. Ask your health care provider how often you should have your eyes checked. Personal lifestyle  choices, including: Daily care of your teeth and gums. Regular physical activity. Eating a healthy diet. Avoiding tobacco and drug use. Limiting alcohol use. Practicing safe sex. Taking low-dose aspirin every day starting at age 55. What happens during an annual well check? The services and screenings done by your health care provider during your annual well check will depend on your age, overall health, lifestyle risk factors, and family history of disease. Counseling  Your health care provider may ask you questions about your: Alcohol use. Tobacco use. Drug use. Emotional well-being. Home and relationship well-being. Sexual activity. Eating habits. Work and work Statistician. Screening  You may have the following tests or measurements: Height, weight, and BMI. Blood pressure. Lipid and cholesterol levels. These may be checked every 5 years, or more frequently if you are over 61 years old. Skin check. Lung cancer screening. You may have this screening every year starting at age 81 if you have a 30-pack-year history of smoking and currently smoke or have quit within the past 15 years. Fecal occult blood test (FOBT) of the stool. You may have this test every year starting at age 14. Flexible sigmoidoscopy or colonoscopy. You may have a sigmoidoscopy every 5 years or a colonoscopy every 10 years starting at age 35. Prostate cancer screening. Recommendations will vary depending on your family history and other risks. Hepatitis C  blood test. Hepatitis B blood test. Sexually transmitted disease (STD) testing. Diabetes screening. This is done by checking your blood sugar (glucose) after you have not eaten for a while (fasting). You may have this done every 1-3 years. Discuss your test results, treatment options, and if necessary, the need for more tests with your health care provider. Vaccines  Your health care provider may recommend certain vaccines, such as: Influenza vaccine. This is  recommended every year. Tetanus, diphtheria, and acellular pertussis (Tdap, Td) vaccine. You may need a Td booster every 10 years. Zoster vaccine. You may need this after age 58. Pneumococcal 13-valent conjugate (PCV13) vaccine. You may need this if you have certain conditions and have not been vaccinated. Pneumococcal polysaccharide (PPSV23) vaccine. You may need one or two doses if you smoke cigarettes or if you have certain conditions. Talk to your health care provider about which screenings and vaccines you need and how often you need them. This information is not intended to replace advice given to you by your health care provider. Make sure you discuss any questions you have with your health care provider. Document Released: 04/01/2015 Document Revised: 11/23/2015 Document Reviewed: 01/04/2015 Elsevier Interactive Patient Education  2017 [place] Prevention in the Home Falls can cause injuries. They can happen to people of all ages. There are many things you can do to make your home safe and to help prevent falls. What can I do on the outside of my home? Regularly fix the edges of walkways and driveways and fix any cracks. Remove anything that might make you trip as you walk through a door, such as a raised step or threshold. Trim any bushes or trees on the path to your home. Use bright outdoor lighting. Clear any walking paths of anything that might make someone trip, such as rocks or tools. Regularly check to see if handrails are loose or broken. Make sure that both sides of any steps have handrails. Any raised decks and porches should have guardrails on the edges. Have any leaves, snow, or ice cleared regularly. Use sand or salt on walking paths during winter. Clean up any spills in your garage right away. This includes oil or grease spills. What can I do in the bathroom? Use night lights. Install grab bars by the toilet and in the tub and shower. Do not use towel bars  as grab bars. Use non-skid mats or decals in the tub or shower. If you need to sit down in the shower, use a plastic, non-slip stool. Keep the floor dry. Clean up any water that spills on the floor as soon as it happens. Remove soap buildup in the tub or shower regularly. Attach bath mats securely with double-sided non-slip rug tape. Do not have throw rugs and other things on the floor that can make you trip. What can I do in the bedroom? Use night lights. Make sure that you have a light by your bed that is easy to reach. Do not use any sheets or blankets that are too big for your bed. They should not hang down onto the floor. Have a firm chair that has side arms. You can use this for support while you get dressed. Do not have throw rugs and other things on the floor that can make you trip. What can I do in the kitchen? Clean up any spills right away. Avoid walking on wet floors. Keep items that you use a lot in easy-to-reach places. If you need to  reach something above you, use a strong step stool that has a grab bar. Keep electrical cords out of the way. Do not use floor polish or wax that makes floors slippery. If you must use wax, use non-skid floor wax. Do not have throw rugs and other things on the floor that can make you trip. What can I do with my stairs? Do not leave any items on the stairs. Make sure that there are handrails on both sides of the stairs and use them. Fix handrails that are broken or loose. Make sure that handrails are as long as the stairways. Check any carpeting to make sure that it is firmly attached to the stairs. Fix any carpet that is loose or worn. Avoid having throw rugs at the top or bottom of the stairs. If you do have throw rugs, attach them to the floor with carpet tape. Make sure that you have a light switch at the top of the stairs and the bottom of the stairs. If you do not have them, ask someone to add them for you. What else can I do to help  prevent falls? Wear shoes that: Do not have high heels. Have rubber bottoms. Are comfortable and fit you well. Are closed at the toe. Do not wear sandals. If you use a stepladder: Make sure that it is fully opened. Do not climb a closed stepladder. Make sure that both sides of the stepladder are locked into place. Ask someone to hold it for you, if possible. Clearly mark and make sure that you can see: Any grab bars or handrails. First and last steps. Where the edge of each step is. Use tools that help you move around (mobility aids) if they are needed. These include: Canes. Walkers. Scooters. Crutches. Turn on the lights when you go into a dark area. Replace any light bulbs as soon as they burn out. Set up your furniture so you have a clear path. Avoid moving your furniture around. If any of your floors are uneven, fix them. If there are any pets around you, be aware of where they are. Review your medicines with your doctor. Some medicines can make you feel dizzy. This can increase your chance of falling. Ask your doctor what other things that you can do to help prevent falls. This information is not intended to replace advice given to you by your health care provider. Make sure you discuss any questions you have with your health care provider. Document Released: 12/30/2008 Document Revised: 08/11/2015 Document Reviewed: 04/09/2014 Elsevier Interactive Patient Education  2017 [employer].

## 2022-05-16 DIAGNOSIS — R209 Unspecified disturbances of skin sensation: Secondary | ICD-10-CM | POA: Diagnosis not present

## 2022-05-16 DIAGNOSIS — D689 Coagulation defect, unspecified: Secondary | ICD-10-CM | POA: Diagnosis not present

## 2022-05-16 DIAGNOSIS — Z992 Dependence on renal dialysis: Secondary | ICD-10-CM | POA: Diagnosis not present

## 2022-05-16 DIAGNOSIS — N2581 Secondary hyperparathyroidism of renal origin: Secondary | ICD-10-CM | POA: Diagnosis not present

## 2022-05-16 DIAGNOSIS — R110 Nausea: Secondary | ICD-10-CM | POA: Diagnosis not present

## 2022-05-16 DIAGNOSIS — D631 Anemia in chronic kidney disease: Secondary | ICD-10-CM | POA: Diagnosis not present

## 2022-05-16 DIAGNOSIS — N186 End stage renal disease: Secondary | ICD-10-CM | POA: Diagnosis not present

## 2022-05-16 DIAGNOSIS — L299 Pruritus, unspecified: Secondary | ICD-10-CM | POA: Diagnosis not present

## 2022-05-16 DIAGNOSIS — R52 Pain, unspecified: Secondary | ICD-10-CM | POA: Diagnosis not present

## 2022-05-17 ENCOUNTER — Ambulatory Visit (INDEPENDENT_AMBULATORY_CARE_PROVIDER_SITE_OTHER): Payer: 59 | Admitting: Pulmonary Disease

## 2022-05-17 ENCOUNTER — Encounter: Payer: Self-pay | Admitting: Pulmonary Disease

## 2022-05-17 VITALS — BP 122/82 | HR 104 | Ht 74.0 in | Wt 253.0 lb

## 2022-05-17 DIAGNOSIS — G4733 Obstructive sleep apnea (adult) (pediatric): Secondary | ICD-10-CM

## 2022-05-17 DIAGNOSIS — Z992 Dependence on renal dialysis: Secondary | ICD-10-CM | POA: Diagnosis not present

## 2022-05-17 DIAGNOSIS — N186 End stage renal disease: Secondary | ICD-10-CM | POA: Diagnosis not present

## 2022-05-17 NOTE — Progress Notes (Signed)
Michael [name]    [id number]    01/11/1976  Primary Care Physician:[place], Blondell Reveal, MD  Referring Physician: [place], Blondell Reveal, MD [address] [place],  [place] [address]  Chief complaint:   Nonrestorative sleep Early awakening  HPI:  Patient with known mild obstructive sleep apnea on CPAP therapy  Continues to use CPAP nightly -On auto CPAP 5-20  Tolerate CPAP well Feels he continues to take the CPAP machine off during the night Makes him uncomfortable after a few hours  Able to fall asleep easily Able to stay asleep the early part of the night but wakes up early  He feels is on the best mask that he is tolerated the best so far, he uses a fullface mask  We do not have a download on the machine at present  Says he does not have any significant difficulty tolerating CPAP  Usually tries to go to bed about 11:00, falls asleep sometimes quickly and sometimes takes him an hour to 2 hours Multiple awakenings at least 2 or 3 Final wake up time is about 6 AM  Continues to work on weight loss Exercises regularly  On dialysis  History of hypertension, end-stage renal disease on hemodialysis-this is secondary to polycystic kidneys Wakes up with a dry mouth in the mornings No morning headaches Over-the-counter sleep aids have not helped previously    Outpatient Encounter Medications as of 05/17/2022  Medication Sig   eszopiclone (LUNESTA) 2 MG TABS tablet Take 1 tablet (2 mg total) by mouth at bedtime as needed for sleep. Take immediately before bedtime   cephALEXin (KEFLEX) 500 MG capsule Take 1 capsule (500 mg total) by mouth daily. Administer after hemodialysis on dialysis days. (Patient not taking: Reported on 05/17/2022)   doxycycline (VIBRAMYCIN) 100 MG capsule Take 1 capsule (100 mg total) by mouth 2 (two) times daily. (Patient not taking: Reported on 05/17/2022)   predniSONE (DELTASONE) 50 MG tablet To be taken 13 hours, 7 hours and 1 hour prior to  Radiology procedure 2/13 (Patient not taking: Reported on 05/17/2022)   [DISCONTINUED] isosorbide dinitrate (ISORDIL) 10 MG tablet Take 1 tablet (10 mg total) by mouth 2 (two) times daily. (Patient not taking: No sig reported)   No facility-administered encounter medications on file as of 05/17/2022.    Allergies as of 05/17/2022 - Review Complete 05/17/2022  Allergen Reaction Noted   Ivp dye [iodinated contrast media] Itching 05/29/2021    Past Medical History:  Diagnosis Date   GERD (gastroesophageal reflux disease)    Gout    History of renal dialysis    M-W-F   HLD (hyperlipidemia)    Hypertension    Intracranial aneurysm    s/p coil embolization: right middle meningeal artery 09/08/20, pericallosal artery 09/22/20, right MCA 12/22/20   Pneumonia    Polycystic kidney disease    SDH (subdural hematoma) ([facility])    subacute right SDH 08/23/20 CTA head   Sleep apnea 07/29/2018   uses cpap nightly   Vitamin D deficiency 11/2018    Past Surgical History:  Procedure Laterality Date   AV FISTULA PLACEMENT Left 03/24/2020   Procedure: INSERTION OF LEFT UPPER EXTREMITY ARTERIOVENOUS (AV) GORE-TEX GRAFT;  Surgeon: [name], MD;  Location: [facility];  Service: Vascular;  Laterality: Left;  PERIPHERAL NERVE BLOCK   [place] Left 12/11/2019   Procedure: 1ST STAGE BASILIC TRANSPOSITION OF LEFT ARM;  Surgeon: Angelia Mould, MD;  Location: [facility];  Service: Vascular;  Laterality: Left;   BIOPSY  04/06/2021   Procedure: BIOPSY;  Surgeon: Thornton Park, MD;  Location: [facility];  Service: Gastroenterology;;   COLONOSCOPY WITH PROPOFOL N/A 04/06/2021   Procedure: COLONOSCOPY WITH PROPOFOL;  Surgeon: Thornton Park, MD;  Location: [facility];  Service: Gastroenterology;  Laterality: N/A;   COLONOSCOPY WITH PROPOFOL N/A 05/29/2021   Procedure: COLONOSCOPY WITH PROPOFOL;  Surgeon: Rush Landmark Telford Nab., MD;  Location: [facility];  Service: Gastroenterology;   Laterality: N/A;   DG AV DIALYSIS GRAFT DECLOT OR Left 05/17/2020   ENDOSCOPIC MUCOSAL RESECTION  05/29/2021   Procedure: ENDOSCOPIC MUCOSAL RESECTION;  Surgeon: Rush Landmark Telford Nab., MD;  Location: [facility];  Service: Gastroenterology;;   HEMOSTASIS CLIP PLACEMENT  05/29/2021   Procedure: HEMOSTASIS CLIP PLACEMENT;  Surgeon: Irving Copas., MD;  Location: [facility];  Service: Gastroenterology;;   IR AV DIALY SHUNT INTRO [place] W/PTA/IMG LEFT  05/01/2022   IR FLUORO GUIDE CV LINE RIGHT  12/09/2019   IR US GUIDE VASC ACCESS LEFT  05/01/2022   IR US GUIDE VASC ACCESS RIGHT  12/09/2019   LEFT HEART CATH AND CORONARY ANGIOGRAPHY N/A 12/09/2019   Procedure: LEFT HEART CATH AND CORONARY ANGIOGRAPHY;  Surgeon: Adrian Prows, MD;  Location: [facility];  Service: Cardiovascular;  Laterality: N/A;   POLYPECTOMY  04/06/2021   Procedure: POLYPECTOMY;  Surgeon: Thornton Park, MD;  [facility];  Service: Gastroenterology;;   POLYPECTOMY  05/29/2021   Procedure: POLYPECTOMY;  Surgeon: Irving Copas., MD;  Location: [facility];  Service: Gastroenterology;;   REVISION OF ARTERIOVENOUS GORETEX GRAFT Left 08/31/2021   Procedure: REDO LEFT UPPER ARM ARTERIOVENOUS GORETEX GRAFT;  Surgeon: Broadus John, MD;  Location: [facility];  Service: Vascular;  Laterality: Left;  [place] INJECTION  05/29/2021   Procedure: SUBMUCOSAL INJECTION;  Surgeon: Irving Copas., MD;  Location: [facility];  Service: Gastroenterology;;  EPI   SUBMUCOSAL LIFTING INJECTION  05/29/2021   Procedure: SUBMUCOSAL LIFTING INJECTION;  Surgeon: Irving Copas., MD;  Location: [facility];  Service: Gastroenterology;;  [place] INJECTION  04/06/2021   Procedure: SUBMUCOSAL TATTOO INJECTION;  Surgeon: Thornton Park, MD;  Location: [facility];  Service: Gastroenterology;;   UPPER EXTREMITY VENOGRAPHY Bilateral 08/24/2021    Procedure: UPPER EXTREMITY VENOGRAPHY;  Surgeon: Marty Heck, MD;  Location: [facility];  Service: Cardiovascular;  Laterality: Bilateral;    Family History  Problem Relation Age of Onset   Diabetes Mother    Ovarian cancer Mother    Bipolar disorder Father    Hypertension Father    Prostate cancer Father    Prostate cancer Paternal Grandfather    Polycystic kidney disease Neg Hx     Social History   Socioeconomic History   Marital status: Single    Spouse name: Not on file   Number of children: 1   Years of education: Not on file   Highest education level: Not on file  Occupational History   Occupation: [employer]   Occupation: disabled  Tobacco Use   Smoking status: Former    Packs/day: 0.20    Years: 10.00    Total pack years: 2.00    Types: Cigarettes    Quit date: 2015    Years since quitting: 9.1   Smokeless tobacco: Never  Vaping Use   Vaping Use: Never used  Substance and Sexual Activity   Alcohol use: Not Currently    Comment:  occasional wine   Drug use: No   Sexual activity: Yes  Other Topics Concern   Not on file  Social History Narrative   Not on file   Social Determinants of Health   Financial Resource Strain: Low Risk  (05/15/2022)   Overall Financial Resource Strain (CARDIA)    Difficulty of Paying Living Expenses: Not hard at all  Food Insecurity: No Food Insecurity (05/15/2022)   Hunger Vital Sign    Worried About Running Out of Food in the Last Year: Never true    Ran Out of Food in the Last Year: Never true  Transportation Needs: No Transportation Needs (05/15/2022)   PRAPARE - Hydrologist (Medical): No    Lack of Transportation (Non-Medical): No  Physical Activity: Insufficiently Active (05/15/2022)   Exercise Vital Sign    Days of Exercise per Week: 2 days    Minutes of Exercise per Session: 60 min  Stress: No Stress Concern Present (05/15/2022)   [place]    Feeling of Stress : Not at all  Social Connections: Moderately Integrated (05/15/2022)   Social Connection and Isolation Panel [NHANES]    Frequency of Communication with Friends and Family: More than three times a week    Frequency of Social Gatherings with Friends and Family: More than three times a week    Attends Religious Services: More than 4 times per year    Active Member of [employer] or Organizations: Yes    Attends Archivist Meetings: More than 4 times per year    Marital Status: Never married  Intimate Partner Violence: Not At Risk (05/15/2022)   Humiliation, Afraid, Rape, and Kick questionnaire    Fear of Current or Ex-Partner: No    Emotionally Abused: No    Physically Abused: No    Sexually Abused: No    Review of Systems  Respiratory:  Positive for apnea.   Psychiatric/Behavioral:  Positive for sleep disturbance.     Vitals:   05/17/22 1036  BP: 122/82  Pulse: (!) 104  SpO2: 96%     Physical Exam Constitutional:      Appearance: He is obese.  HENT:     Head: Normocephalic and atraumatic.     Mouth/Throat:     Mouth: Mucous membranes are moist.  Eyes:     Pupils: Pupils are equal, round, and reactive to light.  Cardiovascular:     Rate and Rhythm: Normal rate and regular rhythm.  Pulmonary:     Effort: No respiratory distress.     Breath sounds: No stridor. No wheezing or rhonchi.  Musculoskeletal:     Cervical back: No rigidity or tenderness.  Neurological:     Mental Status: He is alert.  Psychiatric:        Mood and Affect: Mood normal.   Not not not not with distress with this morning already    02/20/2022    9:00 AM  Results of the Epworth flowsheet  Sitting and reading 1  Watching TV 1  Sitting, inactive in a public place (e.g. a theatre or a meeting) 0  As a passenger in a car for an hour without a break 0  Lying down to rest in the afternoon when circumstances permit 1  Sitting and talking to  someone 0  Sitting quietly after a lunch without alcohol 0  In a car, while stopped for a few minutes in traffic 0  Total  score 3    Data Reviewed: Sleep study from 2019 did reveal mild obstructive sleep apnea No smartcard data -he was able to bring his smart card in for [place] to take a look at Not able to download any information from the card  Smartcard machine is not working well  [employer], do do not have a download on him, has to take his machine in to be downloaded  Assessment:  Obstructive sleep apnea  Nonrestorative sleep  Sleep onset and sleep maintenance insomnia -Continues to use Lunesta and benefits from its use  End-stage renal disease on hemodialysis   Plan/Recommendations:  Continue Lunesta 2 mg  Encouraged to continue using CPAP on a nightly basis  Could not download data from his smart card  Call with significant concerns  Follow-up in 3 months   Sherrilyn Rist MD [facility] 05/17/2022, 10:41 AM  CC: de [place], Blondell Reveal, MD

## 2022-05-17 NOTE — Addendum Note (Signed)
Addended by: June Leap on: 05/17/2022 03:50 PM   Modules accepted: Orders

## 2022-05-17 NOTE — Patient Instructions (Signed)
I will see you back in about 3 months  If you can bring your card back for [place] to, take a look at what is going on with the settings, we may be able to make changes on the pressure that may help  Continue staying active  Continue Lunesta  Call us with significant concerns

## 2022-05-18 DIAGNOSIS — D689 Coagulation defect, unspecified: Secondary | ICD-10-CM | POA: Diagnosis not present

## 2022-05-18 DIAGNOSIS — N186 End stage renal disease: Secondary | ICD-10-CM | POA: Diagnosis not present

## 2022-05-18 DIAGNOSIS — R52 Pain, unspecified: Secondary | ICD-10-CM | POA: Diagnosis not present

## 2022-05-18 DIAGNOSIS — N2581 Secondary hyperparathyroidism of renal origin: Secondary | ICD-10-CM | POA: Diagnosis not present

## 2022-05-18 DIAGNOSIS — R209 Unspecified disturbances of skin sensation: Secondary | ICD-10-CM | POA: Diagnosis not present

## 2022-05-18 DIAGNOSIS — R110 Nausea: Secondary | ICD-10-CM | POA: Diagnosis not present

## 2022-05-18 DIAGNOSIS — L299 Pruritus, unspecified: Secondary | ICD-10-CM | POA: Diagnosis not present

## 2022-05-18 DIAGNOSIS — Z992 Dependence on renal dialysis: Secondary | ICD-10-CM | POA: Diagnosis not present

## 2022-05-18 DIAGNOSIS — D631 Anemia in chronic kidney disease: Secondary | ICD-10-CM | POA: Diagnosis not present

## 2022-05-21 DIAGNOSIS — N186 End stage renal disease: Secondary | ICD-10-CM | POA: Diagnosis not present

## 2022-05-21 DIAGNOSIS — R110 Nausea: Secondary | ICD-10-CM | POA: Diagnosis not present

## 2022-05-21 DIAGNOSIS — R209 Unspecified disturbances of skin sensation: Secondary | ICD-10-CM | POA: Diagnosis not present

## 2022-05-21 DIAGNOSIS — D689 Coagulation defect, unspecified: Secondary | ICD-10-CM | POA: Diagnosis not present

## 2022-05-21 DIAGNOSIS — N2581 Secondary hyperparathyroidism of renal origin: Secondary | ICD-10-CM | POA: Diagnosis not present

## 2022-05-21 DIAGNOSIS — Z992 Dependence on renal dialysis: Secondary | ICD-10-CM | POA: Diagnosis not present

## 2022-05-21 DIAGNOSIS — L299 Pruritus, unspecified: Secondary | ICD-10-CM | POA: Diagnosis not present

## 2022-05-21 DIAGNOSIS — D631 Anemia in chronic kidney disease: Secondary | ICD-10-CM | POA: Diagnosis not present

## 2022-05-21 DIAGNOSIS — R52 Pain, unspecified: Secondary | ICD-10-CM | POA: Diagnosis not present

## 2022-05-23 ENCOUNTER — Telehealth: Payer: Self-pay | Admitting: *Deleted

## 2022-05-23 DIAGNOSIS — R110 Nausea: Secondary | ICD-10-CM | POA: Diagnosis not present

## 2022-05-23 DIAGNOSIS — D689 Coagulation defect, unspecified: Secondary | ICD-10-CM | POA: Diagnosis not present

## 2022-05-23 DIAGNOSIS — N2581 Secondary hyperparathyroidism of renal origin: Secondary | ICD-10-CM | POA: Diagnosis not present

## 2022-05-23 DIAGNOSIS — D631 Anemia in chronic kidney disease: Secondary | ICD-10-CM | POA: Diagnosis not present

## 2022-05-23 DIAGNOSIS — R52 Pain, unspecified: Secondary | ICD-10-CM | POA: Diagnosis not present

## 2022-05-23 DIAGNOSIS — N186 End stage renal disease: Secondary | ICD-10-CM | POA: Diagnosis not present

## 2022-05-23 DIAGNOSIS — L299 Pruritus, unspecified: Secondary | ICD-10-CM | POA: Diagnosis not present

## 2022-05-23 DIAGNOSIS — Z992 Dependence on renal dialysis: Secondary | ICD-10-CM | POA: Diagnosis not present

## 2022-05-23 DIAGNOSIS — R209 Unspecified disturbances of skin sensation: Secondary | ICD-10-CM | POA: Diagnosis not present

## 2022-05-23 NOTE — Progress Notes (Signed)
  Care Coordination   Note   05/23/2022 Name: Michael [name] MRN: [id number] DOB: Aug 07, 1975  Michael [name] is a 47 y.o. year old male who sees [place], Blondell Reveal, MD for primary care. I reached out to [employer] by phone today to offer care coordination services.  Michael [name] was given information about Care Coordination services today including:   The Care Coordination services include support from the care team which includes your Nurse Coordinator, Clinical Social Worker, or Pharmacist.  The Care Coordination team is here to help remove barriers to the health concerns and goals most important to you. Care Coordination services are voluntary, and the patient may decline or stop services at any time by request to their care team member.   Care Coordination Consent Status: Patient agreed to services and verbal consent obtained.   Follow up plan:  Telephone appointment with care coordination team member scheduled for:  05/25/2022  Encounter Outcome:  Pt. Scheduled  Julian Hy, [place] Direct Dial: [phone]

## 2022-05-25 ENCOUNTER — Telehealth: Payer: Self-pay

## 2022-05-25 DIAGNOSIS — R110 Nausea: Secondary | ICD-10-CM | POA: Diagnosis not present

## 2022-05-25 DIAGNOSIS — N186 End stage renal disease: Secondary | ICD-10-CM | POA: Diagnosis not present

## 2022-05-25 DIAGNOSIS — R209 Unspecified disturbances of skin sensation: Secondary | ICD-10-CM | POA: Diagnosis not present

## 2022-05-25 DIAGNOSIS — N2581 Secondary hyperparathyroidism of renal origin: Secondary | ICD-10-CM | POA: Diagnosis not present

## 2022-05-25 DIAGNOSIS — R52 Pain, unspecified: Secondary | ICD-10-CM | POA: Diagnosis not present

## 2022-05-25 DIAGNOSIS — Z992 Dependence on renal dialysis: Secondary | ICD-10-CM | POA: Diagnosis not present

## 2022-05-25 DIAGNOSIS — L299 Pruritus, unspecified: Secondary | ICD-10-CM | POA: Diagnosis not present

## 2022-05-25 DIAGNOSIS — D689 Coagulation defect, unspecified: Secondary | ICD-10-CM | POA: Diagnosis not present

## 2022-05-25 DIAGNOSIS — D631 Anemia in chronic kidney disease: Secondary | ICD-10-CM | POA: Diagnosis not present

## 2022-05-25 NOTE — Patient Outreach (Signed)
  Care Coordination   05/25/2022 Name: Michael [name] MRN: [id number] DOB: 1975-03-22   Care Coordination Outreach Attempts:  An unsuccessful telephone outreach was attempted for a scheduled appointment today.  Follow Up Plan:  Additional outreach attempts will be made to offer the patient care coordination information and services.   Encounter Outcome:  No Answer   Care Coordination Interventions:  No, not indicated    Daneen Schick, BSW, CDP Social Worker, Certified Dementia Practitioner [facility]  Care Coordination [phone]

## 2022-05-28 ENCOUNTER — Telehealth: Payer: Self-pay | Admitting: *Deleted

## 2022-05-28 DIAGNOSIS — D689 Coagulation defect, unspecified: Secondary | ICD-10-CM | POA: Diagnosis not present

## 2022-05-28 DIAGNOSIS — R209 Unspecified disturbances of skin sensation: Secondary | ICD-10-CM | POA: Diagnosis not present

## 2022-05-28 DIAGNOSIS — R52 Pain, unspecified: Secondary | ICD-10-CM | POA: Diagnosis not present

## 2022-05-28 DIAGNOSIS — N186 End stage renal disease: Secondary | ICD-10-CM | POA: Diagnosis not present

## 2022-05-28 DIAGNOSIS — D631 Anemia in chronic kidney disease: Secondary | ICD-10-CM | POA: Diagnosis not present

## 2022-05-28 DIAGNOSIS — N2581 Secondary hyperparathyroidism of renal origin: Secondary | ICD-10-CM | POA: Diagnosis not present

## 2022-05-28 DIAGNOSIS — Z992 Dependence on renal dialysis: Secondary | ICD-10-CM | POA: Diagnosis not present

## 2022-05-28 DIAGNOSIS — R110 Nausea: Secondary | ICD-10-CM | POA: Diagnosis not present

## 2022-05-28 DIAGNOSIS — L299 Pruritus, unspecified: Secondary | ICD-10-CM | POA: Diagnosis not present

## 2022-05-28 NOTE — Progress Notes (Signed)
  Care Coordination Note  05/28/2022 Name: Michael [name] MRN: [id number] DOB: 25-Aug-1975  Michael [name] is a 47 y.o. year old male who is a primary care patient of de [place], Blondell Reveal, MD and is actively engaged with the care management team. I reached out to [employer] by phone today to assist with re-scheduling an initial visit with the BSW  Follow up plan: Pt says he has already spoken with social worker from [place] office and declined to reschedule at this time   Julian Hy, [place]: [phone]

## 2022-05-29 DIAGNOSIS — B351 Tinea unguium: Secondary | ICD-10-CM | POA: Diagnosis not present

## 2022-05-29 DIAGNOSIS — M79674 Pain in right toe(s): Secondary | ICD-10-CM | POA: Diagnosis not present

## 2022-05-29 DIAGNOSIS — M79675 Pain in left toe(s): Secondary | ICD-10-CM | POA: Diagnosis not present

## 2022-05-30 DIAGNOSIS — R52 Pain, unspecified: Secondary | ICD-10-CM | POA: Diagnosis not present

## 2022-05-30 DIAGNOSIS — R209 Unspecified disturbances of skin sensation: Secondary | ICD-10-CM | POA: Diagnosis not present

## 2022-05-30 DIAGNOSIS — D631 Anemia in chronic kidney disease: Secondary | ICD-10-CM | POA: Diagnosis not present

## 2022-05-30 DIAGNOSIS — N186 End stage renal disease: Secondary | ICD-10-CM | POA: Diagnosis not present

## 2022-05-30 DIAGNOSIS — L299 Pruritus, unspecified: Secondary | ICD-10-CM | POA: Diagnosis not present

## 2022-05-30 DIAGNOSIS — N2581 Secondary hyperparathyroidism of renal origin: Secondary | ICD-10-CM | POA: Diagnosis not present

## 2022-05-30 DIAGNOSIS — D689 Coagulation defect, unspecified: Secondary | ICD-10-CM | POA: Diagnosis not present

## 2022-05-30 DIAGNOSIS — R110 Nausea: Secondary | ICD-10-CM | POA: Diagnosis not present

## 2022-05-30 DIAGNOSIS — Z992 Dependence on renal dialysis: Secondary | ICD-10-CM | POA: Diagnosis not present

## 2022-06-01 ENCOUNTER — Telehealth: Payer: Self-pay | Admitting: *Deleted

## 2022-06-01 DIAGNOSIS — R209 Unspecified disturbances of skin sensation: Secondary | ICD-10-CM | POA: Diagnosis not present

## 2022-06-01 DIAGNOSIS — N2581 Secondary hyperparathyroidism of renal origin: Secondary | ICD-10-CM | POA: Diagnosis not present

## 2022-06-01 DIAGNOSIS — R52 Pain, unspecified: Secondary | ICD-10-CM | POA: Diagnosis not present

## 2022-06-01 DIAGNOSIS — N186 End stage renal disease: Secondary | ICD-10-CM | POA: Diagnosis not present

## 2022-06-01 DIAGNOSIS — Z992 Dependence on renal dialysis: Secondary | ICD-10-CM | POA: Diagnosis not present

## 2022-06-01 DIAGNOSIS — D689 Coagulation defect, unspecified: Secondary | ICD-10-CM | POA: Diagnosis not present

## 2022-06-01 DIAGNOSIS — L299 Pruritus, unspecified: Secondary | ICD-10-CM | POA: Diagnosis not present

## 2022-06-01 DIAGNOSIS — D631 Anemia in chronic kidney disease: Secondary | ICD-10-CM | POA: Diagnosis not present

## 2022-06-01 DIAGNOSIS — R110 Nausea: Secondary | ICD-10-CM | POA: Diagnosis not present

## 2022-06-01 NOTE — Telephone Encounter (Signed)
   Telephone encounter was:  Successful.  06/01/2022 Name: Skyy Messerly MRN: [id number] DOB: 01-24-1976  Davio Riskin is a 47 y.o. year old male who is a primary care patient of [place], Blondell Reveal, MD . The community resource team was consulted for assistance with Transportation Needs  and [place] guide performed the following interventions: Patient provided with information about care guide support team and interviewed to confirm resource needs.  Follow Up Plan:  Care guide will follow up with patient by phone over the next [place] [phone] [address] , [place] [address] Email : [email or web address]

## 2022-06-04 ENCOUNTER — Telehealth: Payer: Self-pay | Admitting: *Deleted

## 2022-06-04 DIAGNOSIS — R209 Unspecified disturbances of skin sensation: Secondary | ICD-10-CM | POA: Diagnosis not present

## 2022-06-04 DIAGNOSIS — L299 Pruritus, unspecified: Secondary | ICD-10-CM | POA: Diagnosis not present

## 2022-06-04 DIAGNOSIS — N186 End stage renal disease: Secondary | ICD-10-CM | POA: Diagnosis not present

## 2022-06-04 DIAGNOSIS — Z992 Dependence on renal dialysis: Secondary | ICD-10-CM | POA: Diagnosis not present

## 2022-06-04 DIAGNOSIS — D689 Coagulation defect, unspecified: Secondary | ICD-10-CM | POA: Diagnosis not present

## 2022-06-04 DIAGNOSIS — N2581 Secondary hyperparathyroidism of renal origin: Secondary | ICD-10-CM | POA: Diagnosis not present

## 2022-06-04 DIAGNOSIS — R52 Pain, unspecified: Secondary | ICD-10-CM | POA: Diagnosis not present

## 2022-06-04 DIAGNOSIS — R110 Nausea: Secondary | ICD-10-CM | POA: Diagnosis not present

## 2022-06-04 DIAGNOSIS — D631 Anemia in chronic kidney disease: Secondary | ICD-10-CM | POA: Diagnosis not present

## 2022-06-04 NOTE — Telephone Encounter (Signed)
   Telephone encounter was:  Successful.  06/04/2022 Name: Xaviar Mathiasen MRN: [id number] DOB: 09-Dec-1975  Donivon Eulberg is a 47 y.o. year old male who is a primary care patient of de [place], Blondell Reveal, MD . The community resource team was consulted for assistance with Transportation Needs  Care guide performed the following interventions: Patient provided with information about care guide support team and interviewed to confirm resource needs. Patient with elevating rent and needs to have assistance with housing  Follow Up Plan:  Will call in day or two  [place] [phone] [address] , [name] [address] Email : [email or web address]

## 2022-06-05 NOTE — Telephone Encounter (Signed)
Patient called states he received a letter regarding prep for a procedure that is not aware of.

## 2022-06-05 NOTE — Telephone Encounter (Signed)
I have spoken to the pt and reminded him that we spoke in Feb regarding his colon being moved to 4/11 with GM.  I did also guide him to the instructions in My Chart as well. No further questions or concerns.

## 2022-06-06 DIAGNOSIS — L299 Pruritus, unspecified: Secondary | ICD-10-CM | POA: Diagnosis not present

## 2022-06-06 DIAGNOSIS — N2581 Secondary hyperparathyroidism of renal origin: Secondary | ICD-10-CM | POA: Diagnosis not present

## 2022-06-06 DIAGNOSIS — D689 Coagulation defect, unspecified: Secondary | ICD-10-CM | POA: Diagnosis not present

## 2022-06-06 DIAGNOSIS — N186 End stage renal disease: Secondary | ICD-10-CM | POA: Diagnosis not present

## 2022-06-06 DIAGNOSIS — D631 Anemia in chronic kidney disease: Secondary | ICD-10-CM | POA: Diagnosis not present

## 2022-06-06 DIAGNOSIS — R110 Nausea: Secondary | ICD-10-CM | POA: Diagnosis not present

## 2022-06-06 DIAGNOSIS — Z992 Dependence on renal dialysis: Secondary | ICD-10-CM | POA: Diagnosis not present

## 2022-06-06 DIAGNOSIS — R52 Pain, unspecified: Secondary | ICD-10-CM | POA: Diagnosis not present

## 2022-06-06 DIAGNOSIS — R209 Unspecified disturbances of skin sensation: Secondary | ICD-10-CM | POA: Diagnosis not present

## 2022-06-08 DIAGNOSIS — D631 Anemia in chronic kidney disease: Secondary | ICD-10-CM | POA: Diagnosis not present

## 2022-06-08 DIAGNOSIS — Z992 Dependence on renal dialysis: Secondary | ICD-10-CM | POA: Diagnosis not present

## 2022-06-08 DIAGNOSIS — R209 Unspecified disturbances of skin sensation: Secondary | ICD-10-CM | POA: Diagnosis not present

## 2022-06-08 DIAGNOSIS — N186 End stage renal disease: Secondary | ICD-10-CM | POA: Diagnosis not present

## 2022-06-08 DIAGNOSIS — R52 Pain, unspecified: Secondary | ICD-10-CM | POA: Diagnosis not present

## 2022-06-08 DIAGNOSIS — R110 Nausea: Secondary | ICD-10-CM | POA: Diagnosis not present

## 2022-06-08 DIAGNOSIS — N2581 Secondary hyperparathyroidism of renal origin: Secondary | ICD-10-CM | POA: Diagnosis not present

## 2022-06-08 DIAGNOSIS — D689 Coagulation defect, unspecified: Secondary | ICD-10-CM | POA: Diagnosis not present

## 2022-06-08 DIAGNOSIS — L299 Pruritus, unspecified: Secondary | ICD-10-CM | POA: Diagnosis not present

## 2022-06-11 ENCOUNTER — Telehealth: Payer: Self-pay | Admitting: *Deleted

## 2022-06-11 DIAGNOSIS — Z992 Dependence on renal dialysis: Secondary | ICD-10-CM | POA: Diagnosis not present

## 2022-06-11 DIAGNOSIS — N186 End stage renal disease: Secondary | ICD-10-CM | POA: Diagnosis not present

## 2022-06-11 DIAGNOSIS — D689 Coagulation defect, unspecified: Secondary | ICD-10-CM | POA: Diagnosis not present

## 2022-06-11 DIAGNOSIS — R209 Unspecified disturbances of skin sensation: Secondary | ICD-10-CM | POA: Diagnosis not present

## 2022-06-11 DIAGNOSIS — D631 Anemia in chronic kidney disease: Secondary | ICD-10-CM | POA: Diagnosis not present

## 2022-06-11 DIAGNOSIS — N2581 Secondary hyperparathyroidism of renal origin: Secondary | ICD-10-CM | POA: Diagnosis not present

## 2022-06-11 DIAGNOSIS — R52 Pain, unspecified: Secondary | ICD-10-CM | POA: Diagnosis not present

## 2022-06-11 DIAGNOSIS — R110 Nausea: Secondary | ICD-10-CM | POA: Diagnosis not present

## 2022-06-11 DIAGNOSIS — L299 Pruritus, unspecified: Secondary | ICD-10-CM | POA: Diagnosis not present

## 2022-06-13 DIAGNOSIS — R209 Unspecified disturbances of skin sensation: Secondary | ICD-10-CM | POA: Diagnosis not present

## 2022-06-13 DIAGNOSIS — D689 Coagulation defect, unspecified: Secondary | ICD-10-CM | POA: Diagnosis not present

## 2022-06-13 DIAGNOSIS — L299 Pruritus, unspecified: Secondary | ICD-10-CM | POA: Diagnosis not present

## 2022-06-13 DIAGNOSIS — D631 Anemia in chronic kidney disease: Secondary | ICD-10-CM | POA: Diagnosis not present

## 2022-06-13 DIAGNOSIS — R110 Nausea: Secondary | ICD-10-CM | POA: Diagnosis not present

## 2022-06-13 DIAGNOSIS — R52 Pain, unspecified: Secondary | ICD-10-CM | POA: Diagnosis not present

## 2022-06-13 DIAGNOSIS — N186 End stage renal disease: Secondary | ICD-10-CM | POA: Diagnosis not present

## 2022-06-13 DIAGNOSIS — Z992 Dependence on renal dialysis: Secondary | ICD-10-CM | POA: Diagnosis not present

## 2022-06-13 DIAGNOSIS — N2581 Secondary hyperparathyroidism of renal origin: Secondary | ICD-10-CM | POA: Diagnosis not present

## 2022-06-15 DIAGNOSIS — D631 Anemia in chronic kidney disease: Secondary | ICD-10-CM | POA: Diagnosis not present

## 2022-06-15 DIAGNOSIS — N186 End stage renal disease: Secondary | ICD-10-CM | POA: Diagnosis not present

## 2022-06-15 DIAGNOSIS — R209 Unspecified disturbances of skin sensation: Secondary | ICD-10-CM | POA: Diagnosis not present

## 2022-06-15 DIAGNOSIS — N2581 Secondary hyperparathyroidism of renal origin: Secondary | ICD-10-CM | POA: Diagnosis not present

## 2022-06-15 DIAGNOSIS — Z992 Dependence on renal dialysis: Secondary | ICD-10-CM | POA: Diagnosis not present

## 2022-06-15 DIAGNOSIS — D689 Coagulation defect, unspecified: Secondary | ICD-10-CM | POA: Diagnosis not present

## 2022-06-15 DIAGNOSIS — L299 Pruritus, unspecified: Secondary | ICD-10-CM | POA: Diagnosis not present

## 2022-06-15 DIAGNOSIS — R110 Nausea: Secondary | ICD-10-CM | POA: Diagnosis not present

## 2022-06-15 DIAGNOSIS — R52 Pain, unspecified: Secondary | ICD-10-CM | POA: Diagnosis not present

## 2022-06-17 DIAGNOSIS — N186 End stage renal disease: Secondary | ICD-10-CM | POA: Diagnosis not present

## 2022-06-17 DIAGNOSIS — Z992 Dependence on renal dialysis: Secondary | ICD-10-CM | POA: Diagnosis not present

## 2022-06-18 DIAGNOSIS — D689 Coagulation defect, unspecified: Secondary | ICD-10-CM | POA: Diagnosis not present

## 2022-06-18 DIAGNOSIS — N2581 Secondary hyperparathyroidism of renal origin: Secondary | ICD-10-CM | POA: Diagnosis not present

## 2022-06-18 DIAGNOSIS — L299 Pruritus, unspecified: Secondary | ICD-10-CM | POA: Diagnosis not present

## 2022-06-18 DIAGNOSIS — R52 Pain, unspecified: Secondary | ICD-10-CM | POA: Diagnosis not present

## 2022-06-18 DIAGNOSIS — Z992 Dependence on renal dialysis: Secondary | ICD-10-CM | POA: Diagnosis not present

## 2022-06-18 DIAGNOSIS — D631 Anemia in chronic kidney disease: Secondary | ICD-10-CM | POA: Diagnosis not present

## 2022-06-18 DIAGNOSIS — T7840XA Allergy, unspecified, initial encounter: Secondary | ICD-10-CM | POA: Diagnosis not present

## 2022-06-18 DIAGNOSIS — R209 Unspecified disturbances of skin sensation: Secondary | ICD-10-CM | POA: Diagnosis not present

## 2022-06-18 DIAGNOSIS — R110 Nausea: Secondary | ICD-10-CM | POA: Diagnosis not present

## 2022-06-18 DIAGNOSIS — N186 End stage renal disease: Secondary | ICD-10-CM | POA: Diagnosis not present

## 2022-06-20 DIAGNOSIS — N186 End stage renal disease: Secondary | ICD-10-CM | POA: Diagnosis not present

## 2022-06-20 DIAGNOSIS — Z992 Dependence on renal dialysis: Secondary | ICD-10-CM | POA: Diagnosis not present

## 2022-06-20 DIAGNOSIS — R110 Nausea: Secondary | ICD-10-CM | POA: Diagnosis not present

## 2022-06-20 DIAGNOSIS — D689 Coagulation defect, unspecified: Secondary | ICD-10-CM | POA: Diagnosis not present

## 2022-06-20 DIAGNOSIS — R209 Unspecified disturbances of skin sensation: Secondary | ICD-10-CM | POA: Diagnosis not present

## 2022-06-20 DIAGNOSIS — R52 Pain, unspecified: Secondary | ICD-10-CM | POA: Diagnosis not present

## 2022-06-20 DIAGNOSIS — N2581 Secondary hyperparathyroidism of renal origin: Secondary | ICD-10-CM | POA: Diagnosis not present

## 2022-06-20 DIAGNOSIS — T7840XA Allergy, unspecified, initial encounter: Secondary | ICD-10-CM | POA: Diagnosis not present

## 2022-06-20 DIAGNOSIS — L299 Pruritus, unspecified: Secondary | ICD-10-CM | POA: Diagnosis not present

## 2022-06-20 DIAGNOSIS — D631 Anemia in chronic kidney disease: Secondary | ICD-10-CM | POA: Diagnosis not present

## 2022-06-21 ENCOUNTER — Encounter (HOSPITAL_COMMUNITY): Payer: Self-pay | Admitting: Gastroenterology

## 2022-06-22 DIAGNOSIS — R209 Unspecified disturbances of skin sensation: Secondary | ICD-10-CM | POA: Diagnosis not present

## 2022-06-22 DIAGNOSIS — L299 Pruritus, unspecified: Secondary | ICD-10-CM | POA: Diagnosis not present

## 2022-06-22 DIAGNOSIS — N186 End stage renal disease: Secondary | ICD-10-CM | POA: Diagnosis not present

## 2022-06-22 DIAGNOSIS — D689 Coagulation defect, unspecified: Secondary | ICD-10-CM | POA: Diagnosis not present

## 2022-06-22 DIAGNOSIS — T7840XA Allergy, unspecified, initial encounter: Secondary | ICD-10-CM | POA: Diagnosis not present

## 2022-06-22 DIAGNOSIS — R110 Nausea: Secondary | ICD-10-CM | POA: Diagnosis not present

## 2022-06-22 DIAGNOSIS — Z992 Dependence on renal dialysis: Secondary | ICD-10-CM | POA: Diagnosis not present

## 2022-06-22 DIAGNOSIS — N2581 Secondary hyperparathyroidism of renal origin: Secondary | ICD-10-CM | POA: Diagnosis not present

## 2022-06-22 DIAGNOSIS — R52 Pain, unspecified: Secondary | ICD-10-CM | POA: Diagnosis not present

## 2022-06-22 DIAGNOSIS — D631 Anemia in chronic kidney disease: Secondary | ICD-10-CM | POA: Diagnosis not present

## 2022-06-25 DIAGNOSIS — L299 Pruritus, unspecified: Secondary | ICD-10-CM | POA: Diagnosis not present

## 2022-06-25 DIAGNOSIS — N2581 Secondary hyperparathyroidism of renal origin: Secondary | ICD-10-CM | POA: Diagnosis not present

## 2022-06-25 DIAGNOSIS — D631 Anemia in chronic kidney disease: Secondary | ICD-10-CM | POA: Diagnosis not present

## 2022-06-25 DIAGNOSIS — R110 Nausea: Secondary | ICD-10-CM | POA: Diagnosis not present

## 2022-06-25 DIAGNOSIS — T7840XA Allergy, unspecified, initial encounter: Secondary | ICD-10-CM | POA: Diagnosis not present

## 2022-06-25 DIAGNOSIS — Z992 Dependence on renal dialysis: Secondary | ICD-10-CM | POA: Diagnosis not present

## 2022-06-25 DIAGNOSIS — N186 End stage renal disease: Secondary | ICD-10-CM | POA: Diagnosis not present

## 2022-06-25 DIAGNOSIS — D689 Coagulation defect, unspecified: Secondary | ICD-10-CM | POA: Diagnosis not present

## 2022-06-25 DIAGNOSIS — R52 Pain, unspecified: Secondary | ICD-10-CM | POA: Diagnosis not present

## 2022-06-25 DIAGNOSIS — R209 Unspecified disturbances of skin sensation: Secondary | ICD-10-CM | POA: Diagnosis not present

## 2022-06-26 ENCOUNTER — Ambulatory Visit (HOSPITAL_BASED_OUTPATIENT_CLINIC_OR_DEPARTMENT_OTHER): Payer: 59

## 2022-06-26 DIAGNOSIS — Z0000 Encounter for general adult medical examination without abnormal findings: Secondary | ICD-10-CM

## 2022-06-27 ENCOUNTER — Other Ambulatory Visit: Payer: Self-pay

## 2022-06-27 DIAGNOSIS — L299 Pruritus, unspecified: Secondary | ICD-10-CM | POA: Diagnosis not present

## 2022-06-27 DIAGNOSIS — R52 Pain, unspecified: Secondary | ICD-10-CM | POA: Diagnosis not present

## 2022-06-27 DIAGNOSIS — R209 Unspecified disturbances of skin sensation: Secondary | ICD-10-CM | POA: Diagnosis not present

## 2022-06-27 DIAGNOSIS — N186 End stage renal disease: Secondary | ICD-10-CM | POA: Diagnosis not present

## 2022-06-27 DIAGNOSIS — D631 Anemia in chronic kidney disease: Secondary | ICD-10-CM | POA: Diagnosis not present

## 2022-06-27 DIAGNOSIS — R110 Nausea: Secondary | ICD-10-CM | POA: Diagnosis not present

## 2022-06-27 DIAGNOSIS — T7840XA Allergy, unspecified, initial encounter: Secondary | ICD-10-CM | POA: Diagnosis not present

## 2022-06-27 DIAGNOSIS — D689 Coagulation defect, unspecified: Secondary | ICD-10-CM | POA: Diagnosis not present

## 2022-06-27 DIAGNOSIS — Z992 Dependence on renal dialysis: Secondary | ICD-10-CM | POA: Diagnosis not present

## 2022-06-27 DIAGNOSIS — N2581 Secondary hyperparathyroidism of renal origin: Secondary | ICD-10-CM | POA: Diagnosis not present

## 2022-06-27 LAB — COMPREHENSIVE METABOLIC PANEL
ALT: 17 IU/L (ref 0–44)
AST: 13 IU/L (ref 0–40)
Albumin/Globulin Ratio: 1.3 (ref 1.2–2.2)
Albumin: 4.7 g/dL (ref 4.1–5.1)
Alkaline Phosphatase: 56 IU/L (ref 44–121)
BUN/Creatinine Ratio: 4 — ABNORMAL LOW (ref 9–20)
BUN: 46 mg/dL — ABNORMAL HIGH (ref 6–24)
Bilirubin Total: 0.6 mg/dL (ref 0.0–1.2)
CO2: 24 mmol/L (ref 20–29)
Calcium: 9.6 mg/dL (ref 8.7–10.2)
Chloride: 91 mmol/L — ABNORMAL LOW (ref 96–106)
Creatinine, Ser: 12.86 mg/dL — ABNORMAL HIGH (ref 0.76–1.27)
Globulin, Total: 3.6 g/dL (ref 1.5–4.5)
Glucose: 91 mg/dL (ref 70–99)
Potassium: 5.2 mmol/L (ref 3.5–5.2)
Sodium: 138 mmol/L (ref 134–144)
Total Protein: 8.3 g/dL (ref 6.0–8.5)
eGFR: 4 mL/min/{1.73_m2} — ABNORMAL LOW (ref >59)

## 2022-06-27 LAB — CBC WITH DIFFERENTIAL/PLATELET
Basophils Absolute: 0.1 10*3/uL (ref 0.0–0.2)
Basos: 1 %
EOS (ABSOLUTE): 0.2 10*3/uL (ref 0.0–0.4)
Eos: 2 %
Hematocrit: 45.7 % (ref 37.5–51.0)
Hemoglobin: 14.5 g/dL (ref 13.0–17.7)
Immature Grans (Abs): 0 10*3/uL (ref 0.0–0.1)
Immature Granulocytes: 0 %
Lymphocytes Absolute: 2.6 10*3/uL (ref 0.7–3.1)
Lymphs: 27 %
MCH: 28.3 pg (ref 26.6–33.0)
MCHC: 31.7 g/dL (ref 31.5–35.7)
MCV: 89 fL (ref 79–97)
Monocytes Absolute: 0.7 10*3/uL (ref 0.1–0.9)
Monocytes: 7 %
Neutrophils Absolute: 6.1 10*3/uL (ref 1.4–7.0)
Neutrophils: 63 %
Platelets: 260 10*3/uL (ref 150–450)
RBC: 5.13 x10E6/uL (ref 4.14–5.80)
RDW: 14.8 % (ref 11.6–15.4)
WBC: 9.7 10*3/uL (ref 3.4–10.8)

## 2022-06-27 LAB — LIPID PANEL
Chol/HDL Ratio: 7.4 ratio — ABNORMAL HIGH (ref 0.0–5.0)
Cholesterol, Total: 244 mg/dL — ABNORMAL HIGH (ref 100–199)
HDL: 33 mg/dL — ABNORMAL LOW (ref >39)
LDL Chol Calc (NIH): 163 mg/dL — ABNORMAL HIGH (ref 0–99)
Triglycerides: 255 mg/dL — ABNORMAL HIGH (ref 0–149)
VLDL Cholesterol Cal: 48 mg/dL — ABNORMAL HIGH (ref 5–40)

## 2022-06-27 LAB — TSH RFX ON ABNORMAL TO FREE T4: TSH: 0.801 u[IU]/mL (ref 0.450–4.500)

## 2022-06-27 MED ORDER — NA SULFATE-K SULFATE-MG SULF 17.5-3.13-1.6 GM/177ML PO SOLN: 1.0000 | Freq: Once | ORAL | 0 refills | Status: AC

## 2022-06-28 ENCOUNTER — Ambulatory Visit (HOSPITAL_COMMUNITY): Payer: 59 | Admitting: Anesthesiology

## 2022-06-28 ENCOUNTER — Encounter (HOSPITAL_COMMUNITY)
Admission: RE | Disposition: A | Discharge status: Home / Self Care | Payer: Self-pay | Source: Home / Self Care | Attending: Gastroenterology

## 2022-06-28 ENCOUNTER — Encounter (HOSPITAL_COMMUNITY): Payer: Self-pay | Admitting: Gastroenterology

## 2022-06-28 ENCOUNTER — Ambulatory Visit (HOSPITAL_BASED_OUTPATIENT_CLINIC_OR_DEPARTMENT_OTHER): Payer: 59 | Admitting: Anesthesiology

## 2022-06-28 ENCOUNTER — Ambulatory Visit (HOSPITAL_COMMUNITY)
Admission: RE | Admit: 2022-06-28 | Discharge: 2022-06-28 | Disposition: A | Discharge status: Home / Self Care | Payer: 59 | Attending: Gastroenterology | Admitting: Gastroenterology

## 2022-06-28 ENCOUNTER — Other Ambulatory Visit: Payer: Self-pay

## 2022-06-28 DIAGNOSIS — Z1211 Encounter for screening for malignant neoplasm of colon: Secondary | ICD-10-CM

## 2022-06-28 DIAGNOSIS — K641 Second degree hemorrhoids: Secondary | ICD-10-CM | POA: Diagnosis not present

## 2022-06-28 DIAGNOSIS — D120 Benign neoplasm of cecum: Secondary | ICD-10-CM | POA: Diagnosis not present

## 2022-06-28 DIAGNOSIS — I120 Hypertensive chronic kidney disease with stage 5 chronic kidney disease or end stage renal disease: Secondary | ICD-10-CM | POA: Insufficient documentation

## 2022-06-28 DIAGNOSIS — D127 Benign neoplasm of rectosigmoid junction: Secondary | ICD-10-CM | POA: Diagnosis not present

## 2022-06-28 DIAGNOSIS — D125 Benign neoplasm of sigmoid colon: Secondary | ICD-10-CM | POA: Diagnosis not present

## 2022-06-28 DIAGNOSIS — N186 End stage renal disease: Secondary | ICD-10-CM

## 2022-06-28 DIAGNOSIS — Z86010 Personal history of colonic polyps: Secondary | ICD-10-CM | POA: Diagnosis not present

## 2022-06-28 DIAGNOSIS — G4730 Sleep apnea, unspecified: Secondary | ICD-10-CM | POA: Diagnosis not present

## 2022-06-28 DIAGNOSIS — Z992 Dependence on renal dialysis: Secondary | ICD-10-CM | POA: Diagnosis not present

## 2022-06-28 DIAGNOSIS — K635 Polyp of colon: Secondary | ICD-10-CM

## 2022-06-28 DIAGNOSIS — Z87891 Personal history of nicotine dependence: Secondary | ICD-10-CM | POA: Diagnosis not present

## 2022-06-28 DIAGNOSIS — Z09 Encounter for follow-up examination after completed treatment for conditions other than malignant neoplasm: Secondary | ICD-10-CM | POA: Diagnosis present

## 2022-06-28 HISTORY — PX: POLYPECTOMY: SHX5525

## 2022-06-28 HISTORY — PX: COLONOSCOPY WITH PROPOFOL: SHX5780

## 2022-06-28 LAB — POCT I-STAT, CHEM 8
BUN: 40 mg/dL — ABNORMAL HIGH (ref 6–20)
Calcium, Ion: 1.04 mmol/L — ABNORMAL LOW (ref 1.15–1.40)
Chloride: 98 mmol/L (ref 98–111)
Creatinine, Ser: 13.8 mg/dL — ABNORMAL HIGH (ref 0.61–1.24)
Glucose, Bld: 139 mg/dL — ABNORMAL HIGH (ref 70–99)
HCT: 48 % (ref 39.0–52.0)
Hemoglobin: 16.3 g/dL (ref 13.0–17.0)
Potassium: 5.6 mmol/L — ABNORMAL HIGH (ref 3.5–5.1)
Sodium: 138 mmol/L (ref 135–145)
TCO2: 30 mmol/L (ref 22–32)

## 2022-06-28 SURGERY — COLONOSCOPY WITH PROPOFOL
Anesthesia: Monitor Anesthesia Care

## 2022-06-28 MED ORDER — SIMETHICONE 40 MG/0.6ML PO SUSP
ORAL | Status: AC
  Filled 2022-06-28: qty 0.6

## 2022-06-28 MED ORDER — SODIUM CHLORIDE 0.9 % IV SOLN: INTRAVENOUS | Status: DC

## 2022-06-28 MED ORDER — LIDOCAINE 2% (20 MG/ML) 5 ML SYRINGE
INTRAMUSCULAR | Status: DC | PRN
  Administered 2022-06-28: 100 mg via INTRAVENOUS

## 2022-06-28 MED ORDER — PROPOFOL 500 MG/50ML IV EMUL
INTRAVENOUS | Status: DC | PRN
  Administered 2022-06-28: 120 ug/kg/min via INTRAVENOUS

## 2022-06-28 MED ORDER — PROPOFOL 10 MG/ML IV BOLUS
INTRAVENOUS | Status: DC | PRN
  Administered 2022-06-28 (×4): 20 mg via INTRAVENOUS

## 2022-06-28 MED ORDER — PROPOFOL 1000 MG/100ML IV EMUL
INTRAVENOUS | Status: AC
  Filled 2022-06-28: qty 100

## 2022-06-28 MED ORDER — SODIUM CHLORIDE 0.9 % IR SOLN: 500.0000 mL | Freq: Once | Status: DC

## 2022-06-28 SURGICAL SUPPLY — 22 items

## 2022-06-28 NOTE — Transfer of Care (Signed)
Immediate Anesthesia Transfer of Care Note  Patient: Michael [name]  Procedure(s) Performed: COLONOSCOPY WITH PROPOFOL POLYPECTOMY  Patient Location: PACU  Anesthesia Type:MAC  Level of Consciousness: sedated  Airway & Oxygen Therapy: Patient Spontanous Breathing and Patient connected to face mask oxygen  Post-op Assessment: Report given to RN and Post -op Vital signs reviewed and stable  Post vital signs: Reviewed and stable  Last Vitals:  Vitals Value Taken Time  BP    Temp    Pulse    Resp    SpO2      Last Pain:  Vitals:   06/28/22 0713  TempSrc: Temporal  PainSc: 0-No pain         Complications: No notable events documented.

## 2022-06-28 NOTE — Op Note (Addendum)
[facility] Patient Name: Michael [name] Procedure Date: 06/28/2022 MRN: [id number] Attending MD: Corliss Parish , MD, [id number] Date of Birth: [birth year] CSN: [id number] Age: 47 Admit Type: Outpatient Procedure:                Colonoscopy Indications:              High risk colon cancer surveillance: Personal                            history of adenoma (10 mm or greater in size), High                            risk colon cancer surveillance: Personal history of                            adenoma with high grade dysplasia Providers:                Corliss Parish, MD, Zoe Lan, RN, Harrington Challenger, Technician Referring MD:             Tressia Danas MD, MD, Romie Levee, MD Medicines:                Monitored Anesthesia Care Complications:            No immediate complications. Estimated Blood Loss:     Estimated blood loss was minimal. Procedure:                Pre-Anesthesia Assessment:                           - Prior to the procedure, a History and Physical                            was performed, and patient medications and                            allergies were reviewed. The patient's tolerance of                            previous anesthesia was also reviewed. The risks                            and benefits of the procedure and the sedation                            options and risks were discussed with the patient.                            All questions were answered, and informed consent                            was obtained. Prior Anticoagulants: The patient has  taken no anticoagulant or antiplatelet agents. ASA                            Grade Assessment: III - A patient with severe                            systemic disease. After reviewing the risks and                            benefits, the patient was deemed in satisfactory                            condition to undergo the  procedure.                           After obtaining informed consent, the colonoscope                            was passed under direct vision. Throughout the                            procedure, the patient's blood pressure, pulse, and                            oxygen saturations were monitored continuously. The                            CF-HQ190L ([id number]) Olympus colonoscope was                            introduced through the anus and advanced to the 3                            cm into the ileum. The colonoscopy was performed                            without difficulty. The patient tolerated the                            procedure. The quality of the bowel preparation was                            adequate. The terminal ileum, ileocecal valve,                            appendiceal orifice, and rectum were photographed. Scope In: 7:48:32 AM Scope Out: 8:06:00 AM Scope Withdrawal Time: 0 hours 15 minutes 20 seconds  Total Procedure Duration: 0 hours 17 minutes 28 seconds  Findings:      The digital rectal exam findings include hemorrhoids. Pertinent       negatives include no palpable rectal lesions.      A moderate amount of semi-liquid stool was found in the entire colon,       interfering with visualization. Lavage of the area was performed using  copious amounts, resulting in clearance with adequate visualization.      A 3 mm polyp was found in the cecum. The polyp was sessile. The polyp       was removed with a cold snare. Resection and retrieval were complete.      Six sessile polyps were found in the recto-sigmoid colon (1) and sigmoid       colon (5). The polyps were 2 to 4 mm in size. These polyps were removed       with a cold snare. Resection and retrieval were complete.      A tattoo was seen in the sigmoid colon. A post-mucosectomy scar was       found at the tattoo site. There was no evidence of residual polyp tissue.      Non-bleeding non-thrombosed  internal hemorrhoids were found during       retroflexion, during perianal exam and during digital exam. The       hemorrhoids were Grade II (internal hemorrhoids that prolapse but reduce       spontaneously). Impression:               - Hemorrhoids found on digital rectal exam.                           - Stool in the entire examined colon.                           - One 3 mm polyp in the cecum, removed with a cold                            snare. Resected and retrieved.                           - Six, 2 to 4 mm polyps at the recto-sigmoid colon                            and in the sigmoid colon, removed with a cold                            snare. Resected and retrieved.                           - A tattoo was seen in the sigmoid colon. A                            post-mucosectomy scar was found at the tattoo site.                            There was no evidence of residual polyp tissue.                           - Non-bleeding non-thrombosed internal hemorrhoids. Moderate Sedation:      Not Applicable - Patient had care per Anesthesia. Recommendation:           - The patient will be observed post-procedure,  until all discharge criteria are met.                           - Discharge patient to home.                           - Patient has a contact number available for                            emergencies. The signs and symptoms of potential                            delayed complications were discussed with the                            patient. Return to normal activities tomorrow.                            Written discharge instructions were provided to the                            patient.                           - High fiber diet.                           - Use FiberCon 1-2 tablets PO daily.                           - Continue present medications.                           - Await pathology results.                           - Repeat  colonoscopy in 3 years for surveillance.                           - The findings and recommendations were discussed                            with the patient. Procedure Code(s):        --- Professional ---                           [phone], Colonoscopy, flexible; with removal of                            tumor(s), polyp(s), or other lesion(s) by snare                            technique Diagnosis Code(s):        --- Professional ---                           K64.1, Second  degree hemorrhoids                           D12.0, Benign neoplasm of cecum                           D12.7, Benign neoplasm of rectosigmoid junction                           D12.5, Benign neoplasm of sigmoid colon                           Z86.010, Personal history of colonic polyps CPT copyright 2022 American Medical Association. All rights reserved. The codes documented in this report are preliminary and upon coder review may  be revised to meet current compliance requirements. Corliss ParishGabriel Mansouraty, MD 06/28/2022 8:26:36 AM Number of Addenda: 0

## 2022-06-28 NOTE — H&P (Signed)
GASTROENTEROLOGY PROCEDURE H&P NOTE   Primary Care Physician: Alyson Reedy, FNP  HPI: Michael [name] is a 47 y.o. male who presents for Colonoscopy for colon polyp surveillance.  Past Medical History:  Diagnosis Date   GERD (gastroesophageal reflux disease)    Gout    History of renal dialysis    M-W-F   HLD (hyperlipidemia)    Hypertension    Intracranial aneurysm    s/p coil embolization: right middle meningeal artery 09/08/20, pericallosal artery 09/22/20, right MCA 12/22/20   Pneumonia    Polycystic kidney disease    SDH (subdural hematoma)    subacute right SDH 08/23/20 CTA head   Sleep apnea 07/29/2018   uses cpap nightly   Vitamin D deficiency 11/2018   Past Surgical History:  Procedure Laterality Date   AV FISTULA PLACEMENT Left 03/24/2020   Procedure: INSERTION OF LEFT UPPER EXTREMITY ARTERIOVENOUS (AV) GORE-TEX GRAFT;  Surgeon: Leonie Douglas, MD;  Location: [facility] OR;  Service: Vascular;  Laterality: Left;  PERIPHERAL NERVE BLOCK   BASCILIC VEIN TRANSPOSITION Left 12/11/2019   Procedure: 1ST STAGE BASILIC TRANSPOSITION OF LEFT ARM;  Surgeon: Chuck Hint, MD;  Location: [facility] OR;  Service: Vascular;  Laterality: Left;   BIOPSY  04/06/2021   Procedure: BIOPSY;  Surgeon: Tressia Danas, MD;  Location: [facility] ENDOSCOPY;  Service: Gastroenterology;;   COLONOSCOPY WITH PROPOFOL N/A 04/06/2021   Procedure: COLONOSCOPY WITH PROPOFOL;  Surgeon: Tressia Danas, MD;  Location: [facility]OSCOPY;  Service: Gastroenterology;  Laterality: N/A;   COLONOSCOPY WITH PROPOFOL N/A 05/29/2021   Procedure: COLONOSCOPY WITH PROPOFOL;  Surgeon: Meridee Score Netty Starring., MD;  Location: [facility] ENDOSCOPY;  Service: Gastroenterology;  Laterality: N/A;   DG AV DIALYSIS GRAFT DECLOT OR Left 05/17/2020   ENDOSCOPIC MUCOSAL RESECTION  05/29/2021   Procedure: ENDOSCOPIC MUCOSAL RESECTION;  Surgeon: Meridee Score Netty Starring., MD;  Location: [facility];  Service: Gastroenterology;;   HEMOSTASIS CLIP  PLACEMENT  05/29/2021   Procedure: HEMOSTASIS CLIP PLACEMENT;  Surgeon: Lemar Lofty., MD;  Location: [facility];  Service: Gastroenterology;;   IR AV DIALY SHUNT INTRO NEEDLE/INTRACATH INITIAL W/PTA/IMG LEFT  05/01/2022   IR FLUORO GUIDE CV LINE RIGHT  12/09/2019   IR US GUIDE VASC ACCESS LEFT  05/01/2022   IR US GUIDE VASC ACCESS RIGHT  12/09/2019   LEFT HEART CATH AND CORONARY ANGIOGRAPHY N/A 12/09/2019   Procedure: LEFT HEART CATH AND CORONARY ANGIOGRAPHY;  Surgeon: Yates Decamp, MD;  Location: [facility] INVASIVE CV LAB;  Service: Cardiovascular;  Laterality: N/A;   POLYPECTOMY  04/06/2021   Procedure: POLYPECTOMY;  Surgeon: Tressia Danas, MD;  Location: [facility];  Service: Gastroenterology;;   POLYPECTOMY  05/29/2021   Procedure: POLYPECTOMY;  Surgeon: Lemar Lofty., MD;  Location: [facility];  Service: Gastroenterology;;   REVISION OF ARTERIOVENOUS GORETEX GRAFT Left 08/31/2021   Procedure: REDO LEFT UPPER ARM ARTERIOVENOUS GORETEX GRAFT;  Surgeon: Victorino Sparrow, MD;  Location: [facility] OR;  Service: Vascular;  Laterality: Left;  PERIPHERAL NERVE BLOCK   SUBMUCOSAL INJECTION  05/29/2021   Procedure: SUBMUCOSAL INJECTION;  Surgeon: Lemar Lofty., MD;  Location: [facility];  Service: Gastroenterology;;  EPI   SUBMUCOSAL LIFTING INJECTION  05/29/2021   Procedure: SUBMUCOSAL LIFTING INJECTION;  Surgeon: Lemar Lofty., MD;  Location: [facility];  Service: Gastroenterology;;  Everlift   SUBMUCOSAL TATTOO INJECTION  04/06/2021   Procedure: SUBMUCOSAL TATTOO INJECTION;  Surgeon: Tressia Danas, MD;  Location: [facility];  Service: Gastroenterology;;   UPPER EXTREMITY VENOGRAPHY Bilateral 08/24/2021   Procedure:  UPPER EXTREMITY VENOGRAPHY;  Surgeon: Cephus Shelling, MD;  Location: [facility] INVASIVE CV LAB;  Service: Cardiovascular;  Laterality: Bilateral;   Current Facility-Administered Medications  Medication Dose Route Frequency Provider Last Rate Last  Admin   0.9 %  sodium chloride infusion   Intravenous Continuous Meryl Dare, MD 20 mL/hr at 06/28/22 0715 New Bag at 06/28/22 0715   sodium chloride irrigation 0.9 % 500 mL  500 mL Irrigation Once Mansouraty, Netty Starring., MD        Current Facility-Administered Medications:    0.9 %  sodium chloride infusion, , Intravenous, Continuous, Meryl Dare, MD, Last Rate: 20 mL/hr at 06/28/22 0715, New Bag at 06/28/22 0715   sodium chloride irrigation 0.9 % 500 mL, 500 mL, Irrigation, Once, Mansouraty, Netty Starring., MD Allergies  Allergen Reactions   Ivp Dye [Iodinated Contrast Media] Itching   Family History  Problem Relation Age of Onset   Diabetes Mother    Ovarian cancer Mother    Bipolar disorder Father    Hypertension Father    Prostate cancer Father    Prostate cancer Paternal Grandfather    Polycystic kidney disease Neg Hx    Social History   Socioeconomic History   Marital status: Single    Spouse name: Not on file   Number of children: 1   Years of education: Not on file   Highest education level: Not on file  Occupational History   Occupation: [employer]   Occupation: disabled  Tobacco Use   Smoking status: Former    Packs/day: 0.20    Years: 10.00    Additional pack years: 0.00    Total pack years: 2.00    Types: Cigarettes    Quit date: 2015    Years since quitting: 9.2   Smokeless tobacco: Never  Vaping Use   Vaping Use: Never used  Substance and Sexual Activity   Alcohol use: Not Currently    Comment: occasional wine   Drug use: No   Sexual activity: Yes  Other Topics Concern   Not on file  Social History Narrative   Not on file   Social Determinants of Health   Financial Resource Strain: Low Risk  (05/15/2022)   Overall Financial Resource Strain (CARDIA)    Difficulty of Paying Living Expenses: Not hard at all  Food Insecurity: No Food Insecurity (05/15/2022)   Hunger Vital Sign    Worried About Running Out of Food in the Last Year: Never  true    Ran Out of Food in the Last Year: Never true  Transportation Needs: No Transportation Needs (05/15/2022)   PRAPARE - Administrator, Civil Service (Medical): No    Lack of Transportation (Non-Medical): No  Physical Activity: Insufficiently Active (05/15/2022)   Exercise Vital Sign    Days of Exercise per Week: 2 days    Minutes of Exercise per Session: 60 min  Stress: No Stress Concern Present (05/15/2022)   [employer] of Occupational Health - Occupational Stress Questionnaire    Feeling of Stress : Not at all  Social Connections: Moderately Integrated (05/15/2022)   Social Connection and Isolation Panel [NHANES]    Frequency of Communication with Friends and Family: More than three times a week    Frequency of Social Gatherings with Friends and Family: More than three times a week    Attends Religious Services: More than 4 times per year    Active Member of Clubs or Organizations: Yes    Attends  Club or Organization Meetings: More than 4 times per year    Marital Status: Never married  Intimate Partner Violence: Not At Risk (05/15/2022)   Humiliation, Afraid, Rape, and Kick questionnaire    Fear of Current or Ex-Partner: No    Emotionally Abused: No    Physically Abused: No    Sexually Abused: No    Physical Exam: Today's Vitals   06/28/22 0713  BP: 130/85  Pulse: 90  Resp: 13  Temp: (!) 97.5 F (36.4 C)  TempSrc: Temporal  SpO2: 96%  PainSc: 0-No pain   There is no height or weight on file to calculate BMI. GEN: NAD EYE: Sclerae anicteric ENT: MMM CV: Non-tachycardic GI: Soft, NT/ND NEURO:  Alert & Oriented x 3  Lab Results: Recent Labs    06/26/22 0832  WBC 9.7  HGB 14.5  HCT 45.7  PLT 260   BMET Recent Labs    06/26/22 0832  NA 138  K 5.2  CL 91*  CO2 24  GLUCOSE 91  BUN 46*  CREATININE 12.86*  CALCIUM 9.6   LFT Recent Labs    06/26/22 0832  PROT 8.3  ALBUMIN 4.7  AST 13  ALT 17  ALKPHOS 56  BILITOT 0.6    PT/INR No results for input(s): "LABPROT", "INR" in the last 72 hours.   Impression / Plan: This is a 47 y.o.male who presents for Colonoscopy for colon polyp surveillance.  The risks and benefits of endoscopic evaluation/treatment were discussed with the patient and/or family; these include but are not limited to the risk of perforation, infection, bleeding, missed lesions, lack of diagnosis, severe illness requiring hospitalization, as well as anesthesia and sedation related illnesses.  The patient's history has been reviewed, patient examined, no change in status, and deemed stable for procedure.  The patient and/or family is agreeable to proceed.    Corliss ParishGabriel Mansouraty, MD [facility]oscopy Office # [phone]

## 2022-06-28 NOTE — Discharge Instructions (Signed)

## 2022-06-28 NOTE — Anesthesia Preprocedure Evaluation (Addendum)
Anesthesia Evaluation  Patient identified by MRN, date of birth, ID band Patient awake    Reviewed: Allergy & Precautions, NPO status , Patient's Chart, lab work & pertinent test results  Airway Mallampati: II  TM Distance: >3 FB Neck ROM: Full    Dental  (+) Teeth Intact, Dental Advisory Given   Pulmonary sleep apnea and Continuous Positive Airway Pressure Ventilation , former smoker   Pulmonary exam normal breath sounds clear to auscultation       Cardiovascular hypertension, Normal cardiovascular exam Rhythm:Regular Rate:Normal     Neuro/Psych  PSYCHIATRIC DISORDERS  Depression    H/o aneurysm s/p coiling    GI/Hepatic Neg liver ROS,GERD  ,,  Endo/Other  negative endocrine ROS    Renal/GU ESRF and DialysisRenal disease (MWF)     Musculoskeletal  (+) Arthritis ,    Abdominal   Peds  Hematology negative hematology ROS (+)   Anesthesia Other Findings Day of surgery medications reviewed with the patient.  Reproductive/Obstetrics                             Anesthesia Physical Anesthesia Plan  ASA: 3  Anesthesia Plan: MAC   Post-op Pain Management: Minimal or no pain anticipated   Induction: Intravenous  PONV Risk Score and Plan: 1 and TIVA and Treatment may vary due to age or medical condition  Airway Management Planned: Natural Airway and Simple Face Mask  Additional Equipment:   Intra-op Plan:   Post-operative Plan:   Informed Consent: I have reviewed the patients History and Physical, chart, labs and discussed the procedure including the risks, benefits and alternatives for the proposed anesthesia with the patient or authorized representative who has indicated his/her understanding and acceptance.     Dental advisory given  Plan Discussed with: CRNA  Anesthesia Plan Comments: (Needs DOS K+)        Anesthesia Quick Evaluation

## 2022-06-29 DIAGNOSIS — R52 Pain, unspecified: Secondary | ICD-10-CM | POA: Diagnosis not present

## 2022-06-29 DIAGNOSIS — R110 Nausea: Secondary | ICD-10-CM | POA: Diagnosis not present

## 2022-06-29 DIAGNOSIS — D689 Coagulation defect, unspecified: Secondary | ICD-10-CM | POA: Diagnosis not present

## 2022-06-29 DIAGNOSIS — T7840XA Allergy, unspecified, initial encounter: Secondary | ICD-10-CM | POA: Diagnosis not present

## 2022-06-29 DIAGNOSIS — Z992 Dependence on renal dialysis: Secondary | ICD-10-CM | POA: Diagnosis not present

## 2022-06-29 DIAGNOSIS — D631 Anemia in chronic kidney disease: Secondary | ICD-10-CM | POA: Diagnosis not present

## 2022-06-29 DIAGNOSIS — L299 Pruritus, unspecified: Secondary | ICD-10-CM | POA: Diagnosis not present

## 2022-06-29 DIAGNOSIS — R209 Unspecified disturbances of skin sensation: Secondary | ICD-10-CM | POA: Diagnosis not present

## 2022-06-29 DIAGNOSIS — N186 End stage renal disease: Secondary | ICD-10-CM | POA: Diagnosis not present

## 2022-06-29 DIAGNOSIS — N2581 Secondary hyperparathyroidism of renal origin: Secondary | ICD-10-CM | POA: Diagnosis not present

## 2022-06-29 NOTE — Anesthesia Postprocedure Evaluation (Signed)
Anesthesia Post Note  Patient: Ship broker  Procedure(s) Performed: COLONOSCOPY WITH PROPOFOL POLYPECTOMY     Patient location during evaluation: Endoscopy Anesthesia Type: MAC Level of consciousness: oriented, awake and alert and awake Pain management: pain level controlled Vital Signs Assessment: post-procedure vital signs reviewed and stable Respiratory status: spontaneous breathing, nonlabored ventilation, respiratory function stable and patient connected to nasal cannula oxygen Cardiovascular status: blood pressure returned to baseline and stable Postop Assessment: no headache, no backache and no apparent nausea or vomiting Anesthetic complications: no   No notable events documented.  Last Vitals:  Vitals:   06/28/22 0850 06/28/22 0900  BP: (!) 94/56 (!) 102/51  Pulse: 76 76  Resp: 16   Temp:    SpO2: 95% 96%    Last Pain:  Vitals:   06/28/22 0900  TempSrc:   PainSc: 0-No pain                 Collene Schlichter

## 2022-07-01 ENCOUNTER — Encounter (HOSPITAL_COMMUNITY): Payer: Self-pay | Admitting: Gastroenterology

## 2022-07-02 DIAGNOSIS — R209 Unspecified disturbances of skin sensation: Secondary | ICD-10-CM | POA: Diagnosis not present

## 2022-07-02 DIAGNOSIS — D689 Coagulation defect, unspecified: Secondary | ICD-10-CM | POA: Diagnosis not present

## 2022-07-02 DIAGNOSIS — T7840XA Allergy, unspecified, initial encounter: Secondary | ICD-10-CM | POA: Diagnosis not present

## 2022-07-02 DIAGNOSIS — R52 Pain, unspecified: Secondary | ICD-10-CM | POA: Diagnosis not present

## 2022-07-02 DIAGNOSIS — Z992 Dependence on renal dialysis: Secondary | ICD-10-CM | POA: Diagnosis not present

## 2022-07-02 DIAGNOSIS — R110 Nausea: Secondary | ICD-10-CM | POA: Diagnosis not present

## 2022-07-02 DIAGNOSIS — N186 End stage renal disease: Secondary | ICD-10-CM | POA: Diagnosis not present

## 2022-07-02 DIAGNOSIS — D631 Anemia in chronic kidney disease: Secondary | ICD-10-CM | POA: Diagnosis not present

## 2022-07-02 DIAGNOSIS — N2581 Secondary hyperparathyroidism of renal origin: Secondary | ICD-10-CM | POA: Diagnosis not present

## 2022-07-02 DIAGNOSIS — L299 Pruritus, unspecified: Secondary | ICD-10-CM | POA: Diagnosis not present

## 2022-07-02 LAB — SURGICAL PATHOLOGY

## 2022-07-03 ENCOUNTER — Encounter (HOSPITAL_BASED_OUTPATIENT_CLINIC_OR_DEPARTMENT_OTHER): Payer: 59 | Admitting: Family Medicine

## 2022-07-05 DIAGNOSIS — N2581 Secondary hyperparathyroidism of renal origin: Secondary | ICD-10-CM | POA: Diagnosis not present

## 2022-07-05 DIAGNOSIS — T7840XA Allergy, unspecified, initial encounter: Secondary | ICD-10-CM | POA: Diagnosis not present

## 2022-07-05 DIAGNOSIS — R209 Unspecified disturbances of skin sensation: Secondary | ICD-10-CM | POA: Diagnosis not present

## 2022-07-05 DIAGNOSIS — L299 Pruritus, unspecified: Secondary | ICD-10-CM | POA: Diagnosis not present

## 2022-07-05 DIAGNOSIS — N186 End stage renal disease: Secondary | ICD-10-CM | POA: Diagnosis not present

## 2022-07-05 DIAGNOSIS — Z992 Dependence on renal dialysis: Secondary | ICD-10-CM | POA: Diagnosis not present

## 2022-07-05 DIAGNOSIS — D689 Coagulation defect, unspecified: Secondary | ICD-10-CM | POA: Diagnosis not present

## 2022-07-05 DIAGNOSIS — R110 Nausea: Secondary | ICD-10-CM | POA: Diagnosis not present

## 2022-07-05 DIAGNOSIS — D631 Anemia in chronic kidney disease: Secondary | ICD-10-CM | POA: Diagnosis not present

## 2022-07-05 DIAGNOSIS — R52 Pain, unspecified: Secondary | ICD-10-CM | POA: Diagnosis not present

## 2022-07-06 ENCOUNTER — Encounter: Payer: Self-pay | Admitting: Gastroenterology

## 2022-07-06 DIAGNOSIS — N186 End stage renal disease: Secondary | ICD-10-CM | POA: Diagnosis not present

## 2022-07-06 DIAGNOSIS — Z992 Dependence on renal dialysis: Secondary | ICD-10-CM | POA: Diagnosis not present

## 2022-07-06 DIAGNOSIS — R209 Unspecified disturbances of skin sensation: Secondary | ICD-10-CM | POA: Diagnosis not present

## 2022-07-06 DIAGNOSIS — D631 Anemia in chronic kidney disease: Secondary | ICD-10-CM | POA: Diagnosis not present

## 2022-07-06 DIAGNOSIS — T7840XA Allergy, unspecified, initial encounter: Secondary | ICD-10-CM | POA: Diagnosis not present

## 2022-07-06 DIAGNOSIS — R52 Pain, unspecified: Secondary | ICD-10-CM | POA: Diagnosis not present

## 2022-07-06 DIAGNOSIS — N2581 Secondary hyperparathyroidism of renal origin: Secondary | ICD-10-CM | POA: Diagnosis not present

## 2022-07-06 DIAGNOSIS — L299 Pruritus, unspecified: Secondary | ICD-10-CM | POA: Diagnosis not present

## 2022-07-06 DIAGNOSIS — D689 Coagulation defect, unspecified: Secondary | ICD-10-CM | POA: Diagnosis not present

## 2022-07-06 DIAGNOSIS — R110 Nausea: Secondary | ICD-10-CM | POA: Diagnosis not present

## 2022-07-09 DIAGNOSIS — L299 Pruritus, unspecified: Secondary | ICD-10-CM | POA: Diagnosis not present

## 2022-07-09 DIAGNOSIS — R209 Unspecified disturbances of skin sensation: Secondary | ICD-10-CM | POA: Diagnosis not present

## 2022-07-09 DIAGNOSIS — Z992 Dependence on renal dialysis: Secondary | ICD-10-CM | POA: Diagnosis not present

## 2022-07-09 DIAGNOSIS — R110 Nausea: Secondary | ICD-10-CM | POA: Diagnosis not present

## 2022-07-09 DIAGNOSIS — N186 End stage renal disease: Secondary | ICD-10-CM | POA: Diagnosis not present

## 2022-07-09 DIAGNOSIS — N2581 Secondary hyperparathyroidism of renal origin: Secondary | ICD-10-CM | POA: Diagnosis not present

## 2022-07-09 DIAGNOSIS — R52 Pain, unspecified: Secondary | ICD-10-CM | POA: Diagnosis not present

## 2022-07-09 DIAGNOSIS — D631 Anemia in chronic kidney disease: Secondary | ICD-10-CM | POA: Diagnosis not present

## 2022-07-09 DIAGNOSIS — D689 Coagulation defect, unspecified: Secondary | ICD-10-CM | POA: Diagnosis not present

## 2022-07-09 DIAGNOSIS — T7840XA Allergy, unspecified, initial encounter: Secondary | ICD-10-CM | POA: Diagnosis not present

## 2022-07-10 ENCOUNTER — Ambulatory Visit (INDEPENDENT_AMBULATORY_CARE_PROVIDER_SITE_OTHER): Payer: 59 | Admitting: Family Medicine

## 2022-07-10 ENCOUNTER — Encounter (HOSPITAL_BASED_OUTPATIENT_CLINIC_OR_DEPARTMENT_OTHER): Payer: Self-pay | Admitting: Family Medicine

## 2022-07-10 VITALS — BP 139/93 | HR 97 | Ht 74.0 in | Wt 260.3 lb

## 2022-07-10 DIAGNOSIS — E782 Mixed hyperlipidemia: Secondary | ICD-10-CM | POA: Diagnosis not present

## 2022-07-10 DIAGNOSIS — N186 End stage renal disease: Secondary | ICD-10-CM

## 2022-07-10 DIAGNOSIS — Z992 Dependence on renal dialysis: Secondary | ICD-10-CM | POA: Diagnosis not present

## 2022-07-10 DIAGNOSIS — Z0000 Encounter for general adult medical examination without abnormal findings: Secondary | ICD-10-CM | POA: Diagnosis not present

## 2022-07-10 DIAGNOSIS — R030 Elevated blood-pressure reading, without diagnosis of hypertension: Secondary | ICD-10-CM

## 2022-07-10 NOTE — Progress Notes (Signed)
Complete physical exam  Patient: Michael [name]   DOB: 1975/03/24   47 y.o. Male  MRN: [id number]  Subjective:    Michael [name] is a 47 y.o. male who presents today for a complete physical exam. He reports consuming a general diet. He tries to eat healthy and to watch his sodium and potassium. Gym/ health club routine includes cardio, mod to heavy weightlifting, and walking on track . [employer] to workout. He generally feels well. He reports fairly well, able to fall asleep but has issues staying asleep. Does wears CPAP nightly. Taking Lunesta PRN. He does not have additional problems to discuss today.   ESRD on HD: recent labs done on 06/26/2022; Cr 12.86, eGFR 4  Depression screenings:    07/10/2022    9:14 AM 05/15/2022    9:57 AM 04/05/2022   10:24 AM  Depression screen PHQ 2/9  Decreased Interest 1 0 1  Down, Depressed, Hopeless 1 0 1  PHQ - 2 Score 2 0 2  Altered sleeping 1 0 1  Tired, decreased energy 1 0 1  Change in appetite 0 0 1  Feeling bad or failure about yourself  1 0 1  Trouble concentrating 0 0 1  Moving slowly or fidgety/restless 0 0 0  Suicidal thoughts 0 0 0  PHQ-9 Score 5 0 7  Difficult doing work/chores Somewhat difficult Not difficult at all Somewhat difficult    Anxiety screenings:    07/10/2022    9:15 AM 04/05/2022   10:24 AM 02/16/2020   10:37 AM 06/05/2017    2:54 PM  GAD 7 : Generalized Anxiety Score  Nervous, Anxious, on Edge Control/stop worrying 0  Worry too much - different things 1 1 0 0  Trouble relaxing 0 1 0 0  Restless 1 1 0 0  Easily annoyed or irritable Afraid - awful might happen 1 3 0 0  Total GAD 7 Score Anxiety Difficulty Not difficult at all Somewhat difficult Somewhat difficult    Vision: not this year, Nov 2024 Dentist: recently, Feb 2024- had crowns placed and a few root canals   Patient Care Team: Alyson Reedy, FNP as PCP - [facility] ([facility]) [facility], [facility]   Outpatient Medications Prior to Visit  Medication Sig   eszopiclone (LUNESTA) 2 MG TABS tablet Take 1 tablet (2 mg total) by mouth at bedtime as needed for sleep. Take immediately before bedtime   Ferric Citrate (AURYXIA PO) Take by mouth.   No facility-administered medications prior to visit.   Review of Systems  Constitutional:  Negative for malaise/fatigue and weight loss.  Eyes:  Negative for blurred vision and double vision.  Respiratory:  Negative for cough and shortness of breath.   Cardiovascular:  Negative for chest pain and palpitations.  Gastrointestinal:  Negative for abdominal pain, nausea and vomiting.  Musculoskeletal:  Negative for myalgias.  Neurological:  Negative for dizziness, weakness and headaches.  Psychiatric/Behavioral:  Negative for depression and suicidal ideas. The patient is not nervous/anxious.      Objective:    BP (!) 139/93   Pulse 97   Ht  (1.88 m)   Wt 260 lb 4.8 oz (118.1 kg)   SpO2 97%   BMI 33.42 kg/m  BP Readings from Last 3 Encounters:  07/10/22 (!) 139/93  06/28/22 (!) 102/51  05/17/22 122/82     Physical Exam  Constitutional:      Appearance: Normal appearance.  HENT:     Head: Normocephalic.     Right Ear: Tympanic membrane, ear canal and external ear normal.     Left Ear: Tympanic membrane, ear canal and external ear normal.     Nose: Nose normal.     Mouth/Throat:     Mouth: Mucous membranes are moist.     Pharynx: Oropharynx is clear.  Eyes:     Extraocular Movements: Extraocular movements intact.     Pupils: Pupils are equal, round, and reactive to light.  Cardiovascular:     Rate and Rhythm: Normal rate and regular rhythm.     Pulses: Normal pulses.     Heart sounds: Normal heart sounds.  Pulmonary:     Effort: Pulmonary effort is normal.     Breath sounds: Normal breath sounds.  Abdominal:     General: Abdomen is flat. Bowel sounds are normal.     Palpations: Abdomen is soft.  Musculoskeletal:         General: Normal range of motion.     Right upper arm: Normal.     Left upper arm: Normal. Tenderness: fistula present in LUE.     Cervical back: Normal range of motion.  Skin:    General: Skin is warm and dry.  Neurological:     Mental Status: He is alert.  Psychiatric:        Mood and Affect: Mood normal.        Behavior: Behavior normal.        Thought Content: Thought content normal.        Judgment: Judgment normal.     Assessment & Plan:    Routine Health Maintenance and Physical Exam  Health Maintenance  Topic Date Due   DTaP/Tdap/Td vaccine (1 - Tdap) Never done   COVID-19 Vaccine (3 - Moderna risk series) 03/18/2024*   Medicare Annual Wellness Visit  05/16/2023   Colon Cancer Screening  06/27/2025   Hepatitis C Screening: USPSTF Recommendation to screen - Ages 18-79 yo.  Completed   HIV Screening  Completed   HPV Vaccine  Aged Out  *Topic was postponed. The date shown is not the original due date.   1. Wellness examination Routine HCM labs ordered. Labs reviewed/discussed today.  Review of PMH, FH, SH, medications and HM performed.  Recommend healthy diet.  Recommend approximately 150 minutes/week of moderate intensity exercise. Recommend regular dental and vision exams. Always use seatbelt/lap and shoulder restraints. Recommend using smoke alarms and checking batteries at least twice a year. Recommend using sunscreen when outside. Discussed immunization recommendations for tetanus vaccine. Patient declines at this time, will consider.   Colonoscopy: done 06/28/2022, repeat Q3 years  2. ESRD on hemodialysis Follows with Dr. Lacy Duverney. Has been on HD for about 2.5 years. Underlying diagnosis was polycystic kidney disease. Currently on transplant list.   3. Elevated blood-pressure reading, without diagnosis of hypertension Blood pressure is labile due to hemodialysis. Currently borderline in office today. Reviewed healthy lifestyle modifications- including  healthy diet and daily exercise.   4. Mixed hyperlipidemia Reviewed lipid panel in-depth. Discussed ASCVD risk and lifestyle modifications- including healthy diet and exercise. Recommend heart healthy/Mediterranean diet, with whole grains, fruits, vegetable, fish, lean meats, nuts, and olive oil. Limit salt. Recommend moderate walking, 3-5 times/week for 30-50 minutes each session. Aim for at least 150 minutes.week. Goal should be pace of 3 miles/hours, or walking 1.5 miles in 30 minutes. Recommend avoidance of tobacco products.  Avoid excess alcohol.   The 10-year ASCVD risk score (Arnett DK, et al., 2019) is: 6%   Values used to calculate the score:     Age: 82 years     Sex: Male     Is Non-Hispanic African American: Yes     Diabetic: No     Tobacco smoker: No     Systolic Blood Pressure: 139 mmHg     Is BP treated: No     HDL Cholesterol: 33 mg/dL     Total Cholesterol: 244 mg/dL     Return in about 3 months (around 10/09/2022) for chronic conditions .     Alyson Reedy, FNP

## 2022-07-11 DIAGNOSIS — D689 Coagulation defect, unspecified: Secondary | ICD-10-CM | POA: Diagnosis not present

## 2022-07-11 DIAGNOSIS — R52 Pain, unspecified: Secondary | ICD-10-CM | POA: Diagnosis not present

## 2022-07-11 DIAGNOSIS — N186 End stage renal disease: Secondary | ICD-10-CM | POA: Diagnosis not present

## 2022-07-11 DIAGNOSIS — Z992 Dependence on renal dialysis: Secondary | ICD-10-CM | POA: Diagnosis not present

## 2022-07-11 DIAGNOSIS — T7840XA Allergy, unspecified, initial encounter: Secondary | ICD-10-CM | POA: Diagnosis not present

## 2022-07-11 DIAGNOSIS — N2581 Secondary hyperparathyroidism of renal origin: Secondary | ICD-10-CM | POA: Diagnosis not present

## 2022-07-11 DIAGNOSIS — L299 Pruritus, unspecified: Secondary | ICD-10-CM | POA: Diagnosis not present

## 2022-07-11 DIAGNOSIS — D631 Anemia in chronic kidney disease: Secondary | ICD-10-CM | POA: Diagnosis not present

## 2022-07-11 DIAGNOSIS — R110 Nausea: Secondary | ICD-10-CM | POA: Diagnosis not present

## 2022-07-11 DIAGNOSIS — R209 Unspecified disturbances of skin sensation: Secondary | ICD-10-CM | POA: Diagnosis not present

## 2022-07-12 DIAGNOSIS — T7840XA Allergy, unspecified, initial encounter: Secondary | ICD-10-CM | POA: Diagnosis not present

## 2022-07-12 DIAGNOSIS — Z992 Dependence on renal dialysis: Secondary | ICD-10-CM | POA: Diagnosis not present

## 2022-07-12 DIAGNOSIS — N2581 Secondary hyperparathyroidism of renal origin: Secondary | ICD-10-CM | POA: Diagnosis not present

## 2022-07-12 DIAGNOSIS — R52 Pain, unspecified: Secondary | ICD-10-CM | POA: Diagnosis not present

## 2022-07-12 DIAGNOSIS — D631 Anemia in chronic kidney disease: Secondary | ICD-10-CM | POA: Diagnosis not present

## 2022-07-12 DIAGNOSIS — N186 End stage renal disease: Secondary | ICD-10-CM | POA: Diagnosis not present

## 2022-07-12 DIAGNOSIS — L299 Pruritus, unspecified: Secondary | ICD-10-CM | POA: Diagnosis not present

## 2022-07-12 DIAGNOSIS — R209 Unspecified disturbances of skin sensation: Secondary | ICD-10-CM | POA: Diagnosis not present

## 2022-07-12 DIAGNOSIS — R110 Nausea: Secondary | ICD-10-CM | POA: Diagnosis not present

## 2022-07-12 DIAGNOSIS — D689 Coagulation defect, unspecified: Secondary | ICD-10-CM | POA: Diagnosis not present

## 2022-07-13 DIAGNOSIS — I671 Cerebral aneurysm, nonruptured: Secondary | ICD-10-CM | POA: Diagnosis not present

## 2022-07-16 DIAGNOSIS — N186 End stage renal disease: Secondary | ICD-10-CM | POA: Diagnosis not present

## 2022-07-16 DIAGNOSIS — N2581 Secondary hyperparathyroidism of renal origin: Secondary | ICD-10-CM | POA: Diagnosis not present

## 2022-07-16 DIAGNOSIS — R52 Pain, unspecified: Secondary | ICD-10-CM | POA: Diagnosis not present

## 2022-07-16 DIAGNOSIS — T7840XA Allergy, unspecified, initial encounter: Secondary | ICD-10-CM | POA: Diagnosis not present

## 2022-07-16 DIAGNOSIS — R110 Nausea: Secondary | ICD-10-CM | POA: Diagnosis not present

## 2022-07-16 DIAGNOSIS — R209 Unspecified disturbances of skin sensation: Secondary | ICD-10-CM | POA: Diagnosis not present

## 2022-07-16 DIAGNOSIS — L299 Pruritus, unspecified: Secondary | ICD-10-CM | POA: Diagnosis not present

## 2022-07-16 DIAGNOSIS — Z992 Dependence on renal dialysis: Secondary | ICD-10-CM | POA: Diagnosis not present

## 2022-07-16 DIAGNOSIS — D689 Coagulation defect, unspecified: Secondary | ICD-10-CM | POA: Diagnosis not present

## 2022-07-16 DIAGNOSIS — D631 Anemia in chronic kidney disease: Secondary | ICD-10-CM | POA: Diagnosis not present

## 2022-07-17 DIAGNOSIS — N186 End stage renal disease: Secondary | ICD-10-CM | POA: Diagnosis not present

## 2022-07-17 DIAGNOSIS — Z992 Dependence on renal dialysis: Secondary | ICD-10-CM | POA: Diagnosis not present

## 2022-07-18 DIAGNOSIS — D689 Coagulation defect, unspecified: Secondary | ICD-10-CM | POA: Diagnosis not present

## 2022-07-18 DIAGNOSIS — N2581 Secondary hyperparathyroidism of renal origin: Secondary | ICD-10-CM | POA: Diagnosis not present

## 2022-07-18 DIAGNOSIS — Z992 Dependence on renal dialysis: Secondary | ICD-10-CM | POA: Diagnosis not present

## 2022-07-18 DIAGNOSIS — D631 Anemia in chronic kidney disease: Secondary | ICD-10-CM | POA: Diagnosis not present

## 2022-07-18 DIAGNOSIS — N186 End stage renal disease: Secondary | ICD-10-CM | POA: Diagnosis not present

## 2022-07-18 DIAGNOSIS — R197 Diarrhea, unspecified: Secondary | ICD-10-CM | POA: Diagnosis not present

## 2022-07-18 DIAGNOSIS — L299 Pruritus, unspecified: Secondary | ICD-10-CM | POA: Diagnosis not present

## 2022-07-18 DIAGNOSIS — T7840XA Allergy, unspecified, initial encounter: Secondary | ICD-10-CM | POA: Diagnosis not present

## 2022-07-18 DIAGNOSIS — R209 Unspecified disturbances of skin sensation: Secondary | ICD-10-CM | POA: Diagnosis not present

## 2022-07-18 DIAGNOSIS — R52 Pain, unspecified: Secondary | ICD-10-CM | POA: Diagnosis not present

## 2022-07-18 DIAGNOSIS — R110 Nausea: Secondary | ICD-10-CM | POA: Diagnosis not present

## 2022-07-20 DIAGNOSIS — L299 Pruritus, unspecified: Secondary | ICD-10-CM | POA: Diagnosis not present

## 2022-07-20 DIAGNOSIS — N186 End stage renal disease: Secondary | ICD-10-CM | POA: Diagnosis not present

## 2022-07-20 DIAGNOSIS — R209 Unspecified disturbances of skin sensation: Secondary | ICD-10-CM | POA: Diagnosis not present

## 2022-07-20 DIAGNOSIS — N2581 Secondary hyperparathyroidism of renal origin: Secondary | ICD-10-CM | POA: Diagnosis not present

## 2022-07-20 DIAGNOSIS — T7840XA Allergy, unspecified, initial encounter: Secondary | ICD-10-CM | POA: Diagnosis not present

## 2022-07-20 DIAGNOSIS — R110 Nausea: Secondary | ICD-10-CM | POA: Diagnosis not present

## 2022-07-20 DIAGNOSIS — Z992 Dependence on renal dialysis: Secondary | ICD-10-CM | POA: Diagnosis not present

## 2022-07-20 DIAGNOSIS — R52 Pain, unspecified: Secondary | ICD-10-CM | POA: Diagnosis not present

## 2022-07-20 DIAGNOSIS — R197 Diarrhea, unspecified: Secondary | ICD-10-CM | POA: Diagnosis not present

## 2022-07-20 DIAGNOSIS — D689 Coagulation defect, unspecified: Secondary | ICD-10-CM | POA: Diagnosis not present

## 2022-07-20 DIAGNOSIS — D631 Anemia in chronic kidney disease: Secondary | ICD-10-CM | POA: Diagnosis not present

## 2022-07-24 DIAGNOSIS — R52 Pain, unspecified: Secondary | ICD-10-CM | POA: Diagnosis not present

## 2022-07-24 DIAGNOSIS — N186 End stage renal disease: Secondary | ICD-10-CM | POA: Diagnosis not present

## 2022-07-24 DIAGNOSIS — R110 Nausea: Secondary | ICD-10-CM | POA: Diagnosis not present

## 2022-07-24 DIAGNOSIS — D631 Anemia in chronic kidney disease: Secondary | ICD-10-CM | POA: Diagnosis not present

## 2022-07-24 DIAGNOSIS — T7840XA Allergy, unspecified, initial encounter: Secondary | ICD-10-CM | POA: Diagnosis not present

## 2022-07-24 DIAGNOSIS — L299 Pruritus, unspecified: Secondary | ICD-10-CM | POA: Diagnosis not present

## 2022-07-24 DIAGNOSIS — R197 Diarrhea, unspecified: Secondary | ICD-10-CM | POA: Diagnosis not present

## 2022-07-24 DIAGNOSIS — R209 Unspecified disturbances of skin sensation: Secondary | ICD-10-CM | POA: Diagnosis not present

## 2022-07-24 DIAGNOSIS — Z992 Dependence on renal dialysis: Secondary | ICD-10-CM | POA: Diagnosis not present

## 2022-07-24 DIAGNOSIS — N2581 Secondary hyperparathyroidism of renal origin: Secondary | ICD-10-CM | POA: Diagnosis not present

## 2022-07-24 DIAGNOSIS — D689 Coagulation defect, unspecified: Secondary | ICD-10-CM | POA: Diagnosis not present

## 2022-07-25 DIAGNOSIS — R197 Diarrhea, unspecified: Secondary | ICD-10-CM | POA: Diagnosis not present

## 2022-07-25 DIAGNOSIS — R209 Unspecified disturbances of skin sensation: Secondary | ICD-10-CM | POA: Diagnosis not present

## 2022-07-25 DIAGNOSIS — D689 Coagulation defect, unspecified: Secondary | ICD-10-CM | POA: Diagnosis not present

## 2022-07-25 DIAGNOSIS — L299 Pruritus, unspecified: Secondary | ICD-10-CM | POA: Diagnosis not present

## 2022-07-25 DIAGNOSIS — Z992 Dependence on renal dialysis: Secondary | ICD-10-CM | POA: Diagnosis not present

## 2022-07-25 DIAGNOSIS — N186 End stage renal disease: Secondary | ICD-10-CM | POA: Diagnosis not present

## 2022-07-25 DIAGNOSIS — T7840XA Allergy, unspecified, initial encounter: Secondary | ICD-10-CM | POA: Diagnosis not present

## 2022-07-25 DIAGNOSIS — R110 Nausea: Secondary | ICD-10-CM | POA: Diagnosis not present

## 2022-07-25 DIAGNOSIS — D631 Anemia in chronic kidney disease: Secondary | ICD-10-CM | POA: Diagnosis not present

## 2022-07-25 DIAGNOSIS — R52 Pain, unspecified: Secondary | ICD-10-CM | POA: Diagnosis not present

## 2022-07-25 DIAGNOSIS — N2581 Secondary hyperparathyroidism of renal origin: Secondary | ICD-10-CM | POA: Diagnosis not present

## 2022-07-27 DIAGNOSIS — L299 Pruritus, unspecified: Secondary | ICD-10-CM | POA: Diagnosis not present

## 2022-07-27 DIAGNOSIS — D631 Anemia in chronic kidney disease: Secondary | ICD-10-CM | POA: Diagnosis not present

## 2022-07-27 DIAGNOSIS — N2581 Secondary hyperparathyroidism of renal origin: Secondary | ICD-10-CM | POA: Diagnosis not present

## 2022-07-27 DIAGNOSIS — N186 End stage renal disease: Secondary | ICD-10-CM | POA: Diagnosis not present

## 2022-07-27 DIAGNOSIS — D689 Coagulation defect, unspecified: Secondary | ICD-10-CM | POA: Diagnosis not present

## 2022-07-27 DIAGNOSIS — Z992 Dependence on renal dialysis: Secondary | ICD-10-CM | POA: Diagnosis not present

## 2022-07-27 DIAGNOSIS — R110 Nausea: Secondary | ICD-10-CM | POA: Diagnosis not present

## 2022-07-27 DIAGNOSIS — R197 Diarrhea, unspecified: Secondary | ICD-10-CM | POA: Diagnosis not present

## 2022-07-27 DIAGNOSIS — R209 Unspecified disturbances of skin sensation: Secondary | ICD-10-CM | POA: Diagnosis not present

## 2022-07-27 DIAGNOSIS — T7840XA Allergy, unspecified, initial encounter: Secondary | ICD-10-CM | POA: Diagnosis not present

## 2022-07-27 DIAGNOSIS — R52 Pain, unspecified: Secondary | ICD-10-CM | POA: Diagnosis not present

## 2022-07-30 DIAGNOSIS — R209 Unspecified disturbances of skin sensation: Secondary | ICD-10-CM | POA: Diagnosis not present

## 2022-07-30 DIAGNOSIS — L299 Pruritus, unspecified: Secondary | ICD-10-CM | POA: Diagnosis not present

## 2022-07-30 DIAGNOSIS — D689 Coagulation defect, unspecified: Secondary | ICD-10-CM | POA: Diagnosis not present

## 2022-07-30 DIAGNOSIS — R110 Nausea: Secondary | ICD-10-CM | POA: Diagnosis not present

## 2022-07-30 DIAGNOSIS — N186 End stage renal disease: Secondary | ICD-10-CM | POA: Diagnosis not present

## 2022-07-30 DIAGNOSIS — R52 Pain, unspecified: Secondary | ICD-10-CM | POA: Diagnosis not present

## 2022-07-30 DIAGNOSIS — D631 Anemia in chronic kidney disease: Secondary | ICD-10-CM | POA: Diagnosis not present

## 2022-07-30 DIAGNOSIS — N2581 Secondary hyperparathyroidism of renal origin: Secondary | ICD-10-CM | POA: Diagnosis not present

## 2022-07-30 DIAGNOSIS — R197 Diarrhea, unspecified: Secondary | ICD-10-CM | POA: Diagnosis not present

## 2022-07-30 DIAGNOSIS — Z992 Dependence on renal dialysis: Secondary | ICD-10-CM | POA: Diagnosis not present

## 2022-07-30 DIAGNOSIS — T7840XA Allergy, unspecified, initial encounter: Secondary | ICD-10-CM | POA: Diagnosis not present

## 2022-08-02 DIAGNOSIS — D631 Anemia in chronic kidney disease: Secondary | ICD-10-CM | POA: Diagnosis not present

## 2022-08-02 DIAGNOSIS — N186 End stage renal disease: Secondary | ICD-10-CM | POA: Diagnosis not present

## 2022-08-02 DIAGNOSIS — L299 Pruritus, unspecified: Secondary | ICD-10-CM | POA: Diagnosis not present

## 2022-08-02 DIAGNOSIS — R110 Nausea: Secondary | ICD-10-CM | POA: Diagnosis not present

## 2022-08-02 DIAGNOSIS — Z992 Dependence on renal dialysis: Secondary | ICD-10-CM | POA: Diagnosis not present

## 2022-08-02 DIAGNOSIS — N2581 Secondary hyperparathyroidism of renal origin: Secondary | ICD-10-CM | POA: Diagnosis not present

## 2022-08-02 DIAGNOSIS — D689 Coagulation defect, unspecified: Secondary | ICD-10-CM | POA: Diagnosis not present

## 2022-08-02 DIAGNOSIS — R197 Diarrhea, unspecified: Secondary | ICD-10-CM | POA: Diagnosis not present

## 2022-08-02 DIAGNOSIS — T7840XA Allergy, unspecified, initial encounter: Secondary | ICD-10-CM | POA: Diagnosis not present

## 2022-08-02 DIAGNOSIS — R52 Pain, unspecified: Secondary | ICD-10-CM | POA: Diagnosis not present

## 2022-08-02 DIAGNOSIS — R209 Unspecified disturbances of skin sensation: Secondary | ICD-10-CM | POA: Diagnosis not present

## 2022-08-03 DIAGNOSIS — Z992 Dependence on renal dialysis: Secondary | ICD-10-CM | POA: Diagnosis not present

## 2022-08-03 DIAGNOSIS — R197 Diarrhea, unspecified: Secondary | ICD-10-CM | POA: Diagnosis not present

## 2022-08-03 DIAGNOSIS — R52 Pain, unspecified: Secondary | ICD-10-CM | POA: Diagnosis not present

## 2022-08-03 DIAGNOSIS — R110 Nausea: Secondary | ICD-10-CM | POA: Diagnosis not present

## 2022-08-03 DIAGNOSIS — D689 Coagulation defect, unspecified: Secondary | ICD-10-CM | POA: Diagnosis not present

## 2022-08-03 DIAGNOSIS — T7840XA Allergy, unspecified, initial encounter: Secondary | ICD-10-CM | POA: Diagnosis not present

## 2022-08-03 DIAGNOSIS — R209 Unspecified disturbances of skin sensation: Secondary | ICD-10-CM | POA: Diagnosis not present

## 2022-08-03 DIAGNOSIS — N186 End stage renal disease: Secondary | ICD-10-CM | POA: Diagnosis not present

## 2022-08-03 DIAGNOSIS — D631 Anemia in chronic kidney disease: Secondary | ICD-10-CM | POA: Diagnosis not present

## 2022-08-03 DIAGNOSIS — L299 Pruritus, unspecified: Secondary | ICD-10-CM | POA: Diagnosis not present

## 2022-08-03 DIAGNOSIS — N2581 Secondary hyperparathyroidism of renal origin: Secondary | ICD-10-CM | POA: Diagnosis not present

## 2022-08-06 DIAGNOSIS — G4733 Obstructive sleep apnea (adult) (pediatric): Secondary | ICD-10-CM | POA: Diagnosis not present

## 2022-08-06 DIAGNOSIS — Z992 Dependence on renal dialysis: Secondary | ICD-10-CM | POA: Diagnosis not present

## 2022-08-06 DIAGNOSIS — N186 End stage renal disease: Secondary | ICD-10-CM | POA: Diagnosis not present

## 2022-08-06 DIAGNOSIS — D631 Anemia in chronic kidney disease: Secondary | ICD-10-CM | POA: Diagnosis not present

## 2022-08-06 DIAGNOSIS — N2581 Secondary hyperparathyroidism of renal origin: Secondary | ICD-10-CM | POA: Diagnosis not present

## 2022-08-06 DIAGNOSIS — R52 Pain, unspecified: Secondary | ICD-10-CM | POA: Diagnosis not present

## 2022-08-06 DIAGNOSIS — D689 Coagulation defect, unspecified: Secondary | ICD-10-CM | POA: Diagnosis not present

## 2022-08-06 DIAGNOSIS — R197 Diarrhea, unspecified: Secondary | ICD-10-CM | POA: Diagnosis not present

## 2022-08-06 DIAGNOSIS — R209 Unspecified disturbances of skin sensation: Secondary | ICD-10-CM | POA: Diagnosis not present

## 2022-08-06 DIAGNOSIS — L299 Pruritus, unspecified: Secondary | ICD-10-CM | POA: Diagnosis not present

## 2022-08-06 DIAGNOSIS — T7840XA Allergy, unspecified, initial encounter: Secondary | ICD-10-CM | POA: Diagnosis not present

## 2022-08-06 DIAGNOSIS — R110 Nausea: Secondary | ICD-10-CM | POA: Diagnosis not present

## 2022-08-08 DIAGNOSIS — R209 Unspecified disturbances of skin sensation: Secondary | ICD-10-CM | POA: Diagnosis not present

## 2022-08-08 DIAGNOSIS — D689 Coagulation defect, unspecified: Secondary | ICD-10-CM | POA: Diagnosis not present

## 2022-08-08 DIAGNOSIS — T7840XA Allergy, unspecified, initial encounter: Secondary | ICD-10-CM | POA: Diagnosis not present

## 2022-08-08 DIAGNOSIS — L299 Pruritus, unspecified: Secondary | ICD-10-CM | POA: Diagnosis not present

## 2022-08-08 DIAGNOSIS — D631 Anemia in chronic kidney disease: Secondary | ICD-10-CM | POA: Diagnosis not present

## 2022-08-08 DIAGNOSIS — R110 Nausea: Secondary | ICD-10-CM | POA: Diagnosis not present

## 2022-08-08 DIAGNOSIS — Z992 Dependence on renal dialysis: Secondary | ICD-10-CM | POA: Diagnosis not present

## 2022-08-08 DIAGNOSIS — R52 Pain, unspecified: Secondary | ICD-10-CM | POA: Diagnosis not present

## 2022-08-08 DIAGNOSIS — N186 End stage renal disease: Secondary | ICD-10-CM | POA: Diagnosis not present

## 2022-08-08 DIAGNOSIS — N2581 Secondary hyperparathyroidism of renal origin: Secondary | ICD-10-CM | POA: Diagnosis not present

## 2022-08-08 DIAGNOSIS — R197 Diarrhea, unspecified: Secondary | ICD-10-CM | POA: Diagnosis not present

## 2022-08-10 DIAGNOSIS — Z992 Dependence on renal dialysis: Secondary | ICD-10-CM | POA: Diagnosis not present

## 2022-08-10 DIAGNOSIS — D631 Anemia in chronic kidney disease: Secondary | ICD-10-CM | POA: Diagnosis not present

## 2022-08-10 DIAGNOSIS — R197 Diarrhea, unspecified: Secondary | ICD-10-CM | POA: Diagnosis not present

## 2022-08-10 DIAGNOSIS — D689 Coagulation defect, unspecified: Secondary | ICD-10-CM | POA: Diagnosis not present

## 2022-08-10 DIAGNOSIS — N186 End stage renal disease: Secondary | ICD-10-CM | POA: Diagnosis not present

## 2022-08-10 DIAGNOSIS — R209 Unspecified disturbances of skin sensation: Secondary | ICD-10-CM | POA: Diagnosis not present

## 2022-08-10 DIAGNOSIS — R52 Pain, unspecified: Secondary | ICD-10-CM | POA: Diagnosis not present

## 2022-08-10 DIAGNOSIS — N2581 Secondary hyperparathyroidism of renal origin: Secondary | ICD-10-CM | POA: Diagnosis not present

## 2022-08-10 DIAGNOSIS — T7840XA Allergy, unspecified, initial encounter: Secondary | ICD-10-CM | POA: Diagnosis not present

## 2022-08-10 DIAGNOSIS — R110 Nausea: Secondary | ICD-10-CM | POA: Diagnosis not present

## 2022-08-10 DIAGNOSIS — L299 Pruritus, unspecified: Secondary | ICD-10-CM | POA: Diagnosis not present

## 2022-08-14 DIAGNOSIS — R110 Nausea: Secondary | ICD-10-CM | POA: Diagnosis not present

## 2022-08-14 DIAGNOSIS — D631 Anemia in chronic kidney disease: Secondary | ICD-10-CM | POA: Diagnosis not present

## 2022-08-14 DIAGNOSIS — L299 Pruritus, unspecified: Secondary | ICD-10-CM | POA: Diagnosis not present

## 2022-08-14 DIAGNOSIS — Z992 Dependence on renal dialysis: Secondary | ICD-10-CM | POA: Diagnosis not present

## 2022-08-14 DIAGNOSIS — T7840XA Allergy, unspecified, initial encounter: Secondary | ICD-10-CM | POA: Diagnosis not present

## 2022-08-14 DIAGNOSIS — R52 Pain, unspecified: Secondary | ICD-10-CM | POA: Diagnosis not present

## 2022-08-14 DIAGNOSIS — R209 Unspecified disturbances of skin sensation: Secondary | ICD-10-CM | POA: Diagnosis not present

## 2022-08-14 DIAGNOSIS — N2581 Secondary hyperparathyroidism of renal origin: Secondary | ICD-10-CM | POA: Diagnosis not present

## 2022-08-14 DIAGNOSIS — R197 Diarrhea, unspecified: Secondary | ICD-10-CM | POA: Diagnosis not present

## 2022-08-14 DIAGNOSIS — N186 End stage renal disease: Secondary | ICD-10-CM | POA: Diagnosis not present

## 2022-08-14 DIAGNOSIS — D689 Coagulation defect, unspecified: Secondary | ICD-10-CM | POA: Diagnosis not present

## 2022-08-15 DIAGNOSIS — R110 Nausea: Secondary | ICD-10-CM | POA: Diagnosis not present

## 2022-08-15 DIAGNOSIS — R209 Unspecified disturbances of skin sensation: Secondary | ICD-10-CM | POA: Diagnosis not present

## 2022-08-15 DIAGNOSIS — R52 Pain, unspecified: Secondary | ICD-10-CM | POA: Diagnosis not present

## 2022-08-15 DIAGNOSIS — D631 Anemia in chronic kidney disease: Secondary | ICD-10-CM | POA: Diagnosis not present

## 2022-08-15 DIAGNOSIS — L299 Pruritus, unspecified: Secondary | ICD-10-CM | POA: Diagnosis not present

## 2022-08-15 DIAGNOSIS — D689 Coagulation defect, unspecified: Secondary | ICD-10-CM | POA: Diagnosis not present

## 2022-08-15 DIAGNOSIS — Z992 Dependence on renal dialysis: Secondary | ICD-10-CM | POA: Diagnosis not present

## 2022-08-15 DIAGNOSIS — T7840XA Allergy, unspecified, initial encounter: Secondary | ICD-10-CM | POA: Diagnosis not present

## 2022-08-15 DIAGNOSIS — N2581 Secondary hyperparathyroidism of renal origin: Secondary | ICD-10-CM | POA: Diagnosis not present

## 2022-08-15 DIAGNOSIS — N186 End stage renal disease: Secondary | ICD-10-CM | POA: Diagnosis not present

## 2022-08-15 DIAGNOSIS — R197 Diarrhea, unspecified: Secondary | ICD-10-CM | POA: Diagnosis not present

## 2022-08-17 DIAGNOSIS — R110 Nausea: Secondary | ICD-10-CM | POA: Diagnosis not present

## 2022-08-17 DIAGNOSIS — D689 Coagulation defect, unspecified: Secondary | ICD-10-CM | POA: Diagnosis not present

## 2022-08-17 DIAGNOSIS — L299 Pruritus, unspecified: Secondary | ICD-10-CM | POA: Diagnosis not present

## 2022-08-17 DIAGNOSIS — R197 Diarrhea, unspecified: Secondary | ICD-10-CM | POA: Diagnosis not present

## 2022-08-17 DIAGNOSIS — N2581 Secondary hyperparathyroidism of renal origin: Secondary | ICD-10-CM | POA: Diagnosis not present

## 2022-08-17 DIAGNOSIS — R52 Pain, unspecified: Secondary | ICD-10-CM | POA: Diagnosis not present

## 2022-08-17 DIAGNOSIS — R209 Unspecified disturbances of skin sensation: Secondary | ICD-10-CM | POA: Diagnosis not present

## 2022-08-17 DIAGNOSIS — Z992 Dependence on renal dialysis: Secondary | ICD-10-CM | POA: Diagnosis not present

## 2022-08-17 DIAGNOSIS — T7840XA Allergy, unspecified, initial encounter: Secondary | ICD-10-CM | POA: Diagnosis not present

## 2022-08-17 DIAGNOSIS — N186 End stage renal disease: Secondary | ICD-10-CM | POA: Diagnosis not present

## 2022-08-17 DIAGNOSIS — D631 Anemia in chronic kidney disease: Secondary | ICD-10-CM | POA: Diagnosis not present

## 2022-08-20 DIAGNOSIS — N186 End stage renal disease: Secondary | ICD-10-CM | POA: Diagnosis not present

## 2022-08-20 DIAGNOSIS — R52 Pain, unspecified: Secondary | ICD-10-CM | POA: Diagnosis not present

## 2022-08-20 DIAGNOSIS — Z992 Dependence on renal dialysis: Secondary | ICD-10-CM | POA: Diagnosis not present

## 2022-08-20 DIAGNOSIS — D631 Anemia in chronic kidney disease: Secondary | ICD-10-CM | POA: Diagnosis not present

## 2022-08-20 DIAGNOSIS — R209 Unspecified disturbances of skin sensation: Secondary | ICD-10-CM | POA: Diagnosis not present

## 2022-08-20 DIAGNOSIS — D689 Coagulation defect, unspecified: Secondary | ICD-10-CM | POA: Diagnosis not present

## 2022-08-20 DIAGNOSIS — N2581 Secondary hyperparathyroidism of renal origin: Secondary | ICD-10-CM | POA: Diagnosis not present

## 2022-08-20 DIAGNOSIS — R110 Nausea: Secondary | ICD-10-CM | POA: Diagnosis not present

## 2022-08-22 DIAGNOSIS — R209 Unspecified disturbances of skin sensation: Secondary | ICD-10-CM | POA: Diagnosis not present

## 2022-08-22 DIAGNOSIS — D689 Coagulation defect, unspecified: Secondary | ICD-10-CM | POA: Diagnosis not present

## 2022-08-22 DIAGNOSIS — Z992 Dependence on renal dialysis: Secondary | ICD-10-CM | POA: Diagnosis not present

## 2022-08-22 DIAGNOSIS — D631 Anemia in chronic kidney disease: Secondary | ICD-10-CM | POA: Diagnosis not present

## 2022-08-22 DIAGNOSIS — N186 End stage renal disease: Secondary | ICD-10-CM | POA: Diagnosis not present

## 2022-08-22 DIAGNOSIS — R110 Nausea: Secondary | ICD-10-CM | POA: Diagnosis not present

## 2022-08-22 DIAGNOSIS — R52 Pain, unspecified: Secondary | ICD-10-CM | POA: Diagnosis not present

## 2022-08-22 DIAGNOSIS — N2581 Secondary hyperparathyroidism of renal origin: Secondary | ICD-10-CM | POA: Diagnosis not present

## 2022-08-23 DIAGNOSIS — R52 Pain, unspecified: Secondary | ICD-10-CM | POA: Diagnosis not present

## 2022-08-23 DIAGNOSIS — N186 End stage renal disease: Secondary | ICD-10-CM | POA: Diagnosis not present

## 2022-08-23 DIAGNOSIS — R209 Unspecified disturbances of skin sensation: Secondary | ICD-10-CM | POA: Diagnosis not present

## 2022-08-23 DIAGNOSIS — N2581 Secondary hyperparathyroidism of renal origin: Secondary | ICD-10-CM | POA: Diagnosis not present

## 2022-08-23 DIAGNOSIS — R110 Nausea: Secondary | ICD-10-CM | POA: Diagnosis not present

## 2022-08-23 DIAGNOSIS — Z992 Dependence on renal dialysis: Secondary | ICD-10-CM | POA: Diagnosis not present

## 2022-08-23 DIAGNOSIS — D631 Anemia in chronic kidney disease: Secondary | ICD-10-CM | POA: Diagnosis not present

## 2022-08-23 DIAGNOSIS — D689 Coagulation defect, unspecified: Secondary | ICD-10-CM | POA: Diagnosis not present

## 2022-08-27 DIAGNOSIS — N186 End stage renal disease: Secondary | ICD-10-CM | POA: Diagnosis not present

## 2022-08-27 DIAGNOSIS — D631 Anemia in chronic kidney disease: Secondary | ICD-10-CM | POA: Diagnosis not present

## 2022-08-27 DIAGNOSIS — R110 Nausea: Secondary | ICD-10-CM | POA: Diagnosis not present

## 2022-08-27 DIAGNOSIS — Z992 Dependence on renal dialysis: Secondary | ICD-10-CM | POA: Diagnosis not present

## 2022-08-27 DIAGNOSIS — D689 Coagulation defect, unspecified: Secondary | ICD-10-CM | POA: Diagnosis not present

## 2022-08-27 DIAGNOSIS — R209 Unspecified disturbances of skin sensation: Secondary | ICD-10-CM | POA: Diagnosis not present

## 2022-08-27 DIAGNOSIS — N2581 Secondary hyperparathyroidism of renal origin: Secondary | ICD-10-CM | POA: Diagnosis not present

## 2022-08-27 DIAGNOSIS — R52 Pain, unspecified: Secondary | ICD-10-CM | POA: Diagnosis not present

## 2022-08-28 DIAGNOSIS — B351 Tinea unguium: Secondary | ICD-10-CM | POA: Diagnosis not present

## 2022-08-29 DIAGNOSIS — N186 End stage renal disease: Secondary | ICD-10-CM | POA: Diagnosis not present

## 2022-08-29 DIAGNOSIS — D631 Anemia in chronic kidney disease: Secondary | ICD-10-CM | POA: Diagnosis not present

## 2022-08-29 DIAGNOSIS — Z992 Dependence on renal dialysis: Secondary | ICD-10-CM | POA: Diagnosis not present

## 2022-08-29 DIAGNOSIS — D689 Coagulation defect, unspecified: Secondary | ICD-10-CM | POA: Diagnosis not present

## 2022-08-29 DIAGNOSIS — R52 Pain, unspecified: Secondary | ICD-10-CM | POA: Diagnosis not present

## 2022-08-29 DIAGNOSIS — R110 Nausea: Secondary | ICD-10-CM | POA: Diagnosis not present

## 2022-08-29 DIAGNOSIS — R209 Unspecified disturbances of skin sensation: Secondary | ICD-10-CM | POA: Diagnosis not present

## 2022-08-29 DIAGNOSIS — N2581 Secondary hyperparathyroidism of renal origin: Secondary | ICD-10-CM | POA: Diagnosis not present

## 2022-08-31 DIAGNOSIS — N186 End stage renal disease: Secondary | ICD-10-CM | POA: Diagnosis not present

## 2022-08-31 DIAGNOSIS — N2581 Secondary hyperparathyroidism of renal origin: Secondary | ICD-10-CM | POA: Diagnosis not present

## 2022-08-31 DIAGNOSIS — R110 Nausea: Secondary | ICD-10-CM | POA: Diagnosis not present

## 2022-08-31 DIAGNOSIS — D689 Coagulation defect, unspecified: Secondary | ICD-10-CM | POA: Diagnosis not present

## 2022-08-31 DIAGNOSIS — R209 Unspecified disturbances of skin sensation: Secondary | ICD-10-CM | POA: Diagnosis not present

## 2022-08-31 DIAGNOSIS — D631 Anemia in chronic kidney disease: Secondary | ICD-10-CM | POA: Diagnosis not present

## 2022-08-31 DIAGNOSIS — Z992 Dependence on renal dialysis: Secondary | ICD-10-CM | POA: Diagnosis not present

## 2022-08-31 DIAGNOSIS — R52 Pain, unspecified: Secondary | ICD-10-CM | POA: Diagnosis not present

## 2022-09-03 DIAGNOSIS — D689 Coagulation defect, unspecified: Secondary | ICD-10-CM | POA: Diagnosis not present

## 2022-09-03 DIAGNOSIS — R209 Unspecified disturbances of skin sensation: Secondary | ICD-10-CM | POA: Diagnosis not present

## 2022-09-03 DIAGNOSIS — D631 Anemia in chronic kidney disease: Secondary | ICD-10-CM | POA: Diagnosis not present

## 2022-09-03 DIAGNOSIS — N2581 Secondary hyperparathyroidism of renal origin: Secondary | ICD-10-CM | POA: Diagnosis not present

## 2022-09-03 DIAGNOSIS — N186 End stage renal disease: Secondary | ICD-10-CM | POA: Diagnosis not present

## 2022-09-03 DIAGNOSIS — R52 Pain, unspecified: Secondary | ICD-10-CM | POA: Diagnosis not present

## 2022-09-03 DIAGNOSIS — Z992 Dependence on renal dialysis: Secondary | ICD-10-CM | POA: Diagnosis not present

## 2022-09-03 DIAGNOSIS — R110 Nausea: Secondary | ICD-10-CM | POA: Diagnosis not present

## 2022-09-05 DIAGNOSIS — N2581 Secondary hyperparathyroidism of renal origin: Secondary | ICD-10-CM | POA: Diagnosis not present

## 2022-09-05 DIAGNOSIS — D689 Coagulation defect, unspecified: Secondary | ICD-10-CM | POA: Diagnosis not present

## 2022-09-05 DIAGNOSIS — R110 Nausea: Secondary | ICD-10-CM | POA: Diagnosis not present

## 2022-09-05 DIAGNOSIS — Z992 Dependence on renal dialysis: Secondary | ICD-10-CM | POA: Diagnosis not present

## 2022-09-05 DIAGNOSIS — D631 Anemia in chronic kidney disease: Secondary | ICD-10-CM | POA: Diagnosis not present

## 2022-09-05 DIAGNOSIS — N186 End stage renal disease: Secondary | ICD-10-CM | POA: Diagnosis not present

## 2022-09-05 DIAGNOSIS — R209 Unspecified disturbances of skin sensation: Secondary | ICD-10-CM | POA: Diagnosis not present

## 2022-09-05 DIAGNOSIS — R52 Pain, unspecified: Secondary | ICD-10-CM | POA: Diagnosis not present

## 2022-09-07 DIAGNOSIS — Z992 Dependence on renal dialysis: Secondary | ICD-10-CM | POA: Diagnosis not present

## 2022-09-07 DIAGNOSIS — N186 End stage renal disease: Secondary | ICD-10-CM | POA: Diagnosis not present

## 2022-09-07 DIAGNOSIS — N2581 Secondary hyperparathyroidism of renal origin: Secondary | ICD-10-CM | POA: Diagnosis not present

## 2022-09-07 DIAGNOSIS — D689 Coagulation defect, unspecified: Secondary | ICD-10-CM | POA: Diagnosis not present

## 2022-09-07 DIAGNOSIS — R52 Pain, unspecified: Secondary | ICD-10-CM | POA: Diagnosis not present

## 2022-09-07 DIAGNOSIS — R110 Nausea: Secondary | ICD-10-CM | POA: Diagnosis not present

## 2022-09-07 DIAGNOSIS — D631 Anemia in chronic kidney disease: Secondary | ICD-10-CM | POA: Diagnosis not present

## 2022-09-07 DIAGNOSIS — R209 Unspecified disturbances of skin sensation: Secondary | ICD-10-CM | POA: Diagnosis not present

## 2022-09-10 DIAGNOSIS — Z992 Dependence on renal dialysis: Secondary | ICD-10-CM | POA: Diagnosis not present

## 2022-09-10 DIAGNOSIS — D689 Coagulation defect, unspecified: Secondary | ICD-10-CM | POA: Diagnosis not present

## 2022-09-10 DIAGNOSIS — N2581 Secondary hyperparathyroidism of renal origin: Secondary | ICD-10-CM | POA: Diagnosis not present

## 2022-09-10 DIAGNOSIS — R209 Unspecified disturbances of skin sensation: Secondary | ICD-10-CM | POA: Diagnosis not present

## 2022-09-10 DIAGNOSIS — R52 Pain, unspecified: Secondary | ICD-10-CM | POA: Diagnosis not present

## 2022-09-10 DIAGNOSIS — R110 Nausea: Secondary | ICD-10-CM | POA: Diagnosis not present

## 2022-09-10 DIAGNOSIS — N186 End stage renal disease: Secondary | ICD-10-CM | POA: Diagnosis not present

## 2022-09-10 DIAGNOSIS — D631 Anemia in chronic kidney disease: Secondary | ICD-10-CM | POA: Diagnosis not present

## 2022-09-12 DIAGNOSIS — R52 Pain, unspecified: Secondary | ICD-10-CM | POA: Diagnosis not present

## 2022-09-12 DIAGNOSIS — R209 Unspecified disturbances of skin sensation: Secondary | ICD-10-CM | POA: Diagnosis not present

## 2022-09-12 DIAGNOSIS — D689 Coagulation defect, unspecified: Secondary | ICD-10-CM | POA: Diagnosis not present

## 2022-09-12 DIAGNOSIS — R110 Nausea: Secondary | ICD-10-CM | POA: Diagnosis not present

## 2022-09-12 DIAGNOSIS — N186 End stage renal disease: Secondary | ICD-10-CM | POA: Diagnosis not present

## 2022-09-12 DIAGNOSIS — D631 Anemia in chronic kidney disease: Secondary | ICD-10-CM | POA: Diagnosis not present

## 2022-09-12 DIAGNOSIS — Z992 Dependence on renal dialysis: Secondary | ICD-10-CM | POA: Diagnosis not present

## 2022-09-12 DIAGNOSIS — N2581 Secondary hyperparathyroidism of renal origin: Secondary | ICD-10-CM | POA: Diagnosis not present

## 2022-09-13 ENCOUNTER — Emergency Department (HOSPITAL_BASED_OUTPATIENT_CLINIC_OR_DEPARTMENT_OTHER)
Admission: EM | Admit: 2022-09-13 | Discharge: 2022-09-13 | Disposition: A | Discharge status: Home / Self Care | Payer: 59 | Attending: Emergency Medicine | Admitting: Emergency Medicine

## 2022-09-13 ENCOUNTER — Other Ambulatory Visit: Payer: Self-pay

## 2022-09-13 ENCOUNTER — Emergency Department (HOSPITAL_BASED_OUTPATIENT_CLINIC_OR_DEPARTMENT_OTHER): Payer: 59

## 2022-09-13 ENCOUNTER — Encounter (HOSPITAL_BASED_OUTPATIENT_CLINIC_OR_DEPARTMENT_OTHER): Payer: Self-pay | Admitting: Emergency Medicine

## 2022-09-13 DIAGNOSIS — Z20822 Contact with and (suspected) exposure to covid-19: Secondary | ICD-10-CM | POA: Insufficient documentation

## 2022-09-13 DIAGNOSIS — N186 End stage renal disease: Secondary | ICD-10-CM | POA: Insufficient documentation

## 2022-09-13 DIAGNOSIS — Z992 Dependence on renal dialysis: Secondary | ICD-10-CM | POA: Insufficient documentation

## 2022-09-13 DIAGNOSIS — J029 Acute pharyngitis, unspecified: Secondary | ICD-10-CM | POA: Diagnosis present

## 2022-09-13 DIAGNOSIS — Z886 Allergy status to analgesic agent status: Secondary | ICD-10-CM | POA: Diagnosis not present

## 2022-09-13 DIAGNOSIS — J069 Acute upper respiratory infection, unspecified: Secondary | ICD-10-CM | POA: Insufficient documentation

## 2022-09-13 DIAGNOSIS — R5381 Other malaise: Secondary | ICD-10-CM | POA: Diagnosis not present

## 2022-09-13 DIAGNOSIS — Z7985 Long-term (current) use of injectable non-insulin antidiabetic drugs: Secondary | ICD-10-CM | POA: Diagnosis not present

## 2022-09-13 DIAGNOSIS — Z881 Allergy status to other antibiotic agents status: Secondary | ICD-10-CM | POA: Diagnosis not present

## 2022-09-13 DIAGNOSIS — R059 Cough, unspecified: Secondary | ICD-10-CM | POA: Diagnosis not present

## 2022-09-13 DIAGNOSIS — Z79899 Other long term (current) drug therapy: Secondary | ICD-10-CM | POA: Diagnosis not present

## 2022-09-13 DIAGNOSIS — B9789 Other viral agents as the cause of diseases classified elsewhere: Secondary | ICD-10-CM | POA: Diagnosis not present

## 2022-09-13 LAB — GROUP A STREP BY PCR: Group A Strep by PCR: NOT DETECTED

## 2022-09-13 LAB — SARS CORONAVIRUS 2 BY RT PCR: SARS Coronavirus 2 by RT PCR: NEGATIVE

## 2022-09-13 NOTE — Discharge Instructions (Signed)
Your chest x-ray, COVID-19 and strep test are negative. We think what you have is a viral syndrome - the treatment for which is symptomatic relief only, and your body will fight the infection off in a few days.  Please return to the ER if your symptoms worsen; you have increased pain, fevers, chills, inability to keep any medications down, confusion. Otherwise see the outpatient doctor as requested.

## 2022-09-13 NOTE — ED Triage Notes (Signed)
Pt arrives pov, steady gait, c/o sore throat, cough, HA and sneezing x 3 days, concern for strep.

## 2022-09-13 NOTE — ED Provider Notes (Signed)
EMERGENCY DEPARTMENT AT [facility] Provider Note   CSN: [id number] Arrival date & time: 09/13/22  4010     History  Chief Complaint  Patient presents with   Sore Throat    Michael [name] is a 47 y.o. male.  HPI    47 year old male with history of polycystic kidney disease, ESRD on hemodialysis comes in with chief complaint of URI-like symptoms, sore throat.  Patient states for the last 2 days he has been having sneezing, congestion, cough with green phlegm and headaches.  He also has generalized malaise.  Patient works with elderly population, states that he has been around people coughing.  Patient is having subjective fevers, denies any chills, sweats.  Patient has been up-to-date with his dialysis.  Review of system is negative for any vomiting, diarrhea, neck pain or stiffness.  Home Medications Prior to Admission medications   Medication Sig Start Date End Date Taking? Authorizing Provider  eszopiclone (LUNESTA) 2 MG TABS tablet Take 1 tablet (2 mg total) by mouth at bedtime as needed for sleep. Take immediately before bedtime 02/20/22   Olalere, Onnie Boer A, MD  Ferric Citrate (AURYXIA PO) Take by mouth.    [provider]  isosorbide dinitrate (ISORDIL) 10 MG tablet Take 1 tablet (10 mg total) by mouth 2 (two) times daily. Patient not taking: No sig reported 02/04/20 03/24/20  Massie Maroon, FNP      Allergies    Ivp dye [iodinated contrast media]    Review of Systems   Review of Systems  All other systems reviewed and are negative.   Physical Exam Updated Vital Signs BP 135/77   Pulse 91   Temp 98.3 F (36.8 C) (Oral)   Resp 20   Ht 6\' 2"  (1.88 m)   Wt 113.4 kg   SpO2 98%   BMI 32.10 kg/m  Physical Exam Vitals and nursing note reviewed.  Constitutional:      General: He is not in acute distress.    Appearance: He is well-developed. He is not ill-appearing.  HENT:     Head: Atraumatic.     Nose: No congestion.      Mouth/Throat:     Tonsils: No tonsillar exudate or tonsillar abscesses.  Cardiovascular:     Rate and Rhythm: Normal rate.  Pulmonary:     Effort: Pulmonary effort is normal.  Musculoskeletal:     Cervical back: Neck supple.  Skin:    General: Skin is warm.  Neurological:     Mental Status: He is alert and oriented to person, place, and time.     ED Results / Procedures / Treatments   Labs (all labs ordered are listed, but only abnormal results are displayed) Labs Reviewed  GROUP A STREP BY PCR  SARS CORONAVIRUS 2 BY RT PCR    EKG None  Radiology DG Chest Port 1 View  Result Date: 09/13/2022 CLINICAL DATA:  cough EXAM: PORTABLE CHEST 1 VIEW COMPARISON:  CXR 05/29/21 FINDINGS: The heart size and mediastinal contours are within normal limits. Both lungs are clear. The visualized skeletal structures are unremarkable. IMPRESSION: No focal airspace opacity. Electronically Signed   By: Lorenza Cambridge M.D.   On: 09/13/2022 09:59    Procedures Procedures    Medications Ordered in ED Medications - No data to display  ED Course/ Medical Decision Making/ A&P  Medical Decision Making Amount and/or Complexity of Data Reviewed Radiology: ordered.  47 year old patient comes in with chief complaint of URI-like symptoms, generalized malaise.  He has a history of polycystic kidney disease and ESRD on hemodialysis.  Differential diagnosis considered for this patient includes viral illness, strep is less likely given that patient is coughing, pneumonia, COVID.  Patient denies any vomiting, sweats, chills -no fevers here.  No SIRS criteria on vital signs.  Doubt bacteremia, and I do not think any blood work is indicated at this time.  Plan is to get x-ray of the chest along with strep and COVID test.   Final Clinical Impression(s) / ED Diagnoses Final diagnoses:  Viral URI with cough    Rx / DC Orders ED Discharge Orders     None          Derwood Kaplan, MD 09/13/22 1010

## 2022-09-14 DIAGNOSIS — R52 Pain, unspecified: Secondary | ICD-10-CM | POA: Diagnosis not present

## 2022-09-14 DIAGNOSIS — D689 Coagulation defect, unspecified: Secondary | ICD-10-CM | POA: Diagnosis not present

## 2022-09-14 DIAGNOSIS — N2581 Secondary hyperparathyroidism of renal origin: Secondary | ICD-10-CM | POA: Diagnosis not present

## 2022-09-14 DIAGNOSIS — R209 Unspecified disturbances of skin sensation: Secondary | ICD-10-CM | POA: Diagnosis not present

## 2022-09-14 DIAGNOSIS — R110 Nausea: Secondary | ICD-10-CM | POA: Diagnosis not present

## 2022-09-14 DIAGNOSIS — Z992 Dependence on renal dialysis: Secondary | ICD-10-CM | POA: Diagnosis not present

## 2022-09-14 DIAGNOSIS — D631 Anemia in chronic kidney disease: Secondary | ICD-10-CM | POA: Diagnosis not present

## 2022-09-14 DIAGNOSIS — N186 End stage renal disease: Secondary | ICD-10-CM | POA: Diagnosis not present

## 2022-09-16 DIAGNOSIS — N186 End stage renal disease: Secondary | ICD-10-CM | POA: Diagnosis not present

## 2022-09-16 DIAGNOSIS — Z992 Dependence on renal dialysis: Secondary | ICD-10-CM | POA: Diagnosis not present

## 2022-09-17 DIAGNOSIS — D631 Anemia in chronic kidney disease: Secondary | ICD-10-CM | POA: Diagnosis not present

## 2022-09-17 DIAGNOSIS — Z992 Dependence on renal dialysis: Secondary | ICD-10-CM | POA: Diagnosis not present

## 2022-09-17 DIAGNOSIS — R110 Nausea: Secondary | ICD-10-CM | POA: Diagnosis not present

## 2022-09-17 DIAGNOSIS — R209 Unspecified disturbances of skin sensation: Secondary | ICD-10-CM | POA: Diagnosis not present

## 2022-09-17 DIAGNOSIS — N186 End stage renal disease: Secondary | ICD-10-CM | POA: Diagnosis not present

## 2022-09-17 DIAGNOSIS — N2581 Secondary hyperparathyroidism of renal origin: Secondary | ICD-10-CM | POA: Diagnosis not present

## 2022-09-17 DIAGNOSIS — D689 Coagulation defect, unspecified: Secondary | ICD-10-CM | POA: Diagnosis not present

## 2022-09-17 DIAGNOSIS — R52 Pain, unspecified: Secondary | ICD-10-CM | POA: Diagnosis not present

## 2022-09-19 DIAGNOSIS — R110 Nausea: Secondary | ICD-10-CM | POA: Diagnosis not present

## 2022-09-19 DIAGNOSIS — D631 Anemia in chronic kidney disease: Secondary | ICD-10-CM | POA: Diagnosis not present

## 2022-09-19 DIAGNOSIS — R209 Unspecified disturbances of skin sensation: Secondary | ICD-10-CM | POA: Diagnosis not present

## 2022-09-19 DIAGNOSIS — D689 Coagulation defect, unspecified: Secondary | ICD-10-CM | POA: Diagnosis not present

## 2022-09-19 DIAGNOSIS — N2581 Secondary hyperparathyroidism of renal origin: Secondary | ICD-10-CM | POA: Diagnosis not present

## 2022-09-19 DIAGNOSIS — R52 Pain, unspecified: Secondary | ICD-10-CM | POA: Diagnosis not present

## 2022-09-19 DIAGNOSIS — Z992 Dependence on renal dialysis: Secondary | ICD-10-CM | POA: Diagnosis not present

## 2022-09-19 DIAGNOSIS — N186 End stage renal disease: Secondary | ICD-10-CM | POA: Diagnosis not present

## 2022-09-21 DIAGNOSIS — N2581 Secondary hyperparathyroidism of renal origin: Secondary | ICD-10-CM | POA: Diagnosis not present

## 2022-09-21 DIAGNOSIS — D631 Anemia in chronic kidney disease: Secondary | ICD-10-CM | POA: Diagnosis not present

## 2022-09-21 DIAGNOSIS — D689 Coagulation defect, unspecified: Secondary | ICD-10-CM | POA: Diagnosis not present

## 2022-09-21 DIAGNOSIS — R209 Unspecified disturbances of skin sensation: Secondary | ICD-10-CM | POA: Diagnosis not present

## 2022-09-21 DIAGNOSIS — Z992 Dependence on renal dialysis: Secondary | ICD-10-CM | POA: Diagnosis not present

## 2022-09-21 DIAGNOSIS — R110 Nausea: Secondary | ICD-10-CM | POA: Diagnosis not present

## 2022-09-21 DIAGNOSIS — N186 End stage renal disease: Secondary | ICD-10-CM | POA: Diagnosis not present

## 2022-09-21 DIAGNOSIS — R52 Pain, unspecified: Secondary | ICD-10-CM | POA: Diagnosis not present

## 2022-09-24 DIAGNOSIS — R209 Unspecified disturbances of skin sensation: Secondary | ICD-10-CM | POA: Diagnosis not present

## 2022-09-24 DIAGNOSIS — R110 Nausea: Secondary | ICD-10-CM | POA: Diagnosis not present

## 2022-09-24 DIAGNOSIS — N2581 Secondary hyperparathyroidism of renal origin: Secondary | ICD-10-CM | POA: Diagnosis not present

## 2022-09-24 DIAGNOSIS — N186 End stage renal disease: Secondary | ICD-10-CM | POA: Diagnosis not present

## 2022-09-24 DIAGNOSIS — R52 Pain, unspecified: Secondary | ICD-10-CM | POA: Diagnosis not present

## 2022-09-24 DIAGNOSIS — Z992 Dependence on renal dialysis: Secondary | ICD-10-CM | POA: Diagnosis not present

## 2022-09-24 DIAGNOSIS — D689 Coagulation defect, unspecified: Secondary | ICD-10-CM | POA: Diagnosis not present

## 2022-09-24 DIAGNOSIS — D631 Anemia in chronic kidney disease: Secondary | ICD-10-CM | POA: Diagnosis not present

## 2022-09-27 DIAGNOSIS — D631 Anemia in chronic kidney disease: Secondary | ICD-10-CM | POA: Diagnosis not present

## 2022-09-27 DIAGNOSIS — N186 End stage renal disease: Secondary | ICD-10-CM | POA: Diagnosis not present

## 2022-09-27 DIAGNOSIS — Z992 Dependence on renal dialysis: Secondary | ICD-10-CM | POA: Diagnosis not present

## 2022-09-27 DIAGNOSIS — R110 Nausea: Secondary | ICD-10-CM | POA: Diagnosis not present

## 2022-09-27 DIAGNOSIS — N2581 Secondary hyperparathyroidism of renal origin: Secondary | ICD-10-CM | POA: Diagnosis not present

## 2022-09-27 DIAGNOSIS — D689 Coagulation defect, unspecified: Secondary | ICD-10-CM | POA: Diagnosis not present

## 2022-09-27 DIAGNOSIS — R52 Pain, unspecified: Secondary | ICD-10-CM | POA: Diagnosis not present

## 2022-09-27 DIAGNOSIS — R209 Unspecified disturbances of skin sensation: Secondary | ICD-10-CM | POA: Diagnosis not present

## 2022-09-27 LAB — CBC AND DIFFERENTIAL: Hemoglobin: 12.2 — AB (ref 13.5–17.5)

## 2022-09-28 DIAGNOSIS — N186 End stage renal disease: Secondary | ICD-10-CM | POA: Diagnosis not present

## 2022-09-28 DIAGNOSIS — R209 Unspecified disturbances of skin sensation: Secondary | ICD-10-CM | POA: Diagnosis not present

## 2022-09-28 DIAGNOSIS — R110 Nausea: Secondary | ICD-10-CM | POA: Diagnosis not present

## 2022-09-28 DIAGNOSIS — N2581 Secondary hyperparathyroidism of renal origin: Secondary | ICD-10-CM | POA: Diagnosis not present

## 2022-09-28 DIAGNOSIS — D689 Coagulation defect, unspecified: Secondary | ICD-10-CM | POA: Diagnosis not present

## 2022-09-28 DIAGNOSIS — D631 Anemia in chronic kidney disease: Secondary | ICD-10-CM | POA: Diagnosis not present

## 2022-09-28 DIAGNOSIS — Z992 Dependence on renal dialysis: Secondary | ICD-10-CM | POA: Diagnosis not present

## 2022-09-28 DIAGNOSIS — R52 Pain, unspecified: Secondary | ICD-10-CM | POA: Diagnosis not present

## 2022-09-29 ENCOUNTER — Emergency Department (HOSPITAL_BASED_OUTPATIENT_CLINIC_OR_DEPARTMENT_OTHER)
Admission: EM | Admit: 2022-09-29 | Discharge: 2022-09-29 | Disposition: A | Discharge status: Home / Self Care | Payer: 59 | Attending: Emergency Medicine | Admitting: Emergency Medicine

## 2022-09-29 ENCOUNTER — Encounter (HOSPITAL_BASED_OUTPATIENT_CLINIC_OR_DEPARTMENT_OTHER): Payer: Self-pay

## 2022-09-29 ENCOUNTER — Emergency Department (HOSPITAL_BASED_OUTPATIENT_CLINIC_OR_DEPARTMENT_OTHER): Payer: 59

## 2022-09-29 ENCOUNTER — Other Ambulatory Visit: Payer: Self-pay

## 2022-09-29 DIAGNOSIS — Z992 Dependence on renal dialysis: Secondary | ICD-10-CM | POA: Insufficient documentation

## 2022-09-29 DIAGNOSIS — I120 Hypertensive chronic kidney disease with stage 5 chronic kidney disease or end stage renal disease: Secondary | ICD-10-CM | POA: Diagnosis not present

## 2022-09-29 DIAGNOSIS — S0990XA Unspecified injury of head, initial encounter: Secondary | ICD-10-CM | POA: Diagnosis not present

## 2022-09-29 DIAGNOSIS — R519 Headache, unspecified: Secondary | ICD-10-CM | POA: Diagnosis not present

## 2022-09-29 DIAGNOSIS — Y92410 Unspecified street and highway as the place of occurrence of the external cause: Secondary | ICD-10-CM | POA: Insufficient documentation

## 2022-09-29 DIAGNOSIS — N186 End stage renal disease: Secondary | ICD-10-CM | POA: Insufficient documentation

## 2022-09-29 DIAGNOSIS — M542 Cervicalgia: Secondary | ICD-10-CM | POA: Diagnosis not present

## 2022-09-29 MED ORDER — HYDROCODONE-ACETAMINOPHEN 5-325 MG PO TABS
1.0000 | ORAL_TABLET | Freq: Once | ORAL | Status: AC
  Administered 2022-09-29: 1 via ORAL
  Filled 2022-09-29: qty 1

## 2022-09-29 MED ORDER — METHOCARBAMOL 500 MG PO TABS: 500.0000 mg | ORAL_TABLET | Freq: Two times a day (BID) | ORAL | 0 refills | Status: DC

## 2022-09-29 MED ORDER — LIDOCAINE 5 % EX PTCH: 1.0000 | MEDICATED_PATCH | CUTANEOUS | 0 refills | Status: DC
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
## 2022-09-29 NOTE — ED Notes (Signed)
Pt oriented to call light. Visitors in the room.

## 2022-10-01 DIAGNOSIS — R110 Nausea: Secondary | ICD-10-CM | POA: Diagnosis not present

## 2022-10-01 DIAGNOSIS — N2581 Secondary hyperparathyroidism of renal origin: Secondary | ICD-10-CM | POA: Diagnosis not present

## 2022-10-01 DIAGNOSIS — R209 Unspecified disturbances of skin sensation: Secondary | ICD-10-CM | POA: Diagnosis not present

## 2022-10-01 DIAGNOSIS — D631 Anemia in chronic kidney disease: Secondary | ICD-10-CM | POA: Diagnosis not present

## 2022-10-01 DIAGNOSIS — N186 End stage renal disease: Secondary | ICD-10-CM | POA: Diagnosis not present

## 2022-10-01 DIAGNOSIS — R52 Pain, unspecified: Secondary | ICD-10-CM | POA: Diagnosis not present

## 2022-10-01 DIAGNOSIS — D689 Coagulation defect, unspecified: Secondary | ICD-10-CM | POA: Diagnosis not present

## 2022-10-01 DIAGNOSIS — Z992 Dependence on renal dialysis: Secondary | ICD-10-CM | POA: Diagnosis not present

## 2022-10-02 ENCOUNTER — Encounter (HOSPITAL_BASED_OUTPATIENT_CLINIC_OR_DEPARTMENT_OTHER): Payer: Self-pay | Admitting: Emergency Medicine

## 2022-10-02 ENCOUNTER — Emergency Department (HOSPITAL_BASED_OUTPATIENT_CLINIC_OR_DEPARTMENT_OTHER): Payer: 59 | Admitting: Radiology

## 2022-10-02 ENCOUNTER — Emergency Department (HOSPITAL_BASED_OUTPATIENT_CLINIC_OR_DEPARTMENT_OTHER)
Admission: EM | Admit: 2022-10-02 | Discharge: 2022-10-02 | Disposition: A | Discharge status: Home / Self Care | Payer: 59 | Attending: Emergency Medicine | Admitting: Emergency Medicine

## 2022-10-02 ENCOUNTER — Other Ambulatory Visit: Payer: Self-pay

## 2022-10-02 DIAGNOSIS — M542 Cervicalgia: Secondary | ICD-10-CM | POA: Diagnosis not present

## 2022-10-02 DIAGNOSIS — M549 Dorsalgia, unspecified: Secondary | ICD-10-CM | POA: Diagnosis not present

## 2022-10-02 DIAGNOSIS — M545 Low back pain, unspecified: Secondary | ICD-10-CM | POA: Diagnosis not present

## 2022-10-02 MED ORDER — CYCLOBENZAPRINE HCL 10 MG PO TABS: 10.0000 mg | ORAL_TABLET | Freq: Two times a day (BID) | ORAL | 0 refills | Status: DC | PRN

## 2022-10-02 MED ORDER — IBUPROFEN 800 MG PO TABS
800.0000 mg | ORAL_TABLET | Freq: Once | ORAL | Status: AC
  Administered 2022-10-02: 800 mg via ORAL
  Filled 2022-10-02: qty 1

## 2022-10-02 MED ORDER — LIDOCAINE 5 % EX PTCH
1.0000 | MEDICATED_PATCH | CUTANEOUS | Status: DC
  Administered 2022-10-02: 1 via TRANSDERMAL
  Filled 2022-10-02: qty 1

## 2022-10-02 MED ORDER — LIDOCAINE 5 % EX PTCH: 1.0000 | MEDICATED_PATCH | CUTANEOUS | 0 refills | Status: DC

## 2022-10-02 NOTE — Discharge Instructions (Addendum)
Evaluation for your back pain was overall reassuring.  Recommend you follow-up with your PCP if your symptoms persist.  Sent Flexeril and you do have a prescription for Lidoderm patches at your pharmacy as well.
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
## 2022-10-02 NOTE — ED Notes (Signed)
Pt verbalized understanding of d/c instructions, meds, and followup care. Denies questions. VSS, no distress noted. Steady gait to exit with all belongings.  ?
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
## 2022-10-03 DIAGNOSIS — N186 End stage renal disease: Secondary | ICD-10-CM | POA: Diagnosis not present

## 2022-10-03 DIAGNOSIS — N2581 Secondary hyperparathyroidism of renal origin: Secondary | ICD-10-CM | POA: Diagnosis not present

## 2022-10-03 DIAGNOSIS — R52 Pain, unspecified: Secondary | ICD-10-CM | POA: Diagnosis not present

## 2022-10-03 DIAGNOSIS — R110 Nausea: Secondary | ICD-10-CM | POA: Diagnosis not present

## 2022-10-03 DIAGNOSIS — Z992 Dependence on renal dialysis: Secondary | ICD-10-CM | POA: Diagnosis not present

## 2022-10-03 DIAGNOSIS — R209 Unspecified disturbances of skin sensation: Secondary | ICD-10-CM | POA: Diagnosis not present

## 2022-10-03 DIAGNOSIS — D631 Anemia in chronic kidney disease: Secondary | ICD-10-CM | POA: Diagnosis not present

## 2022-10-03 DIAGNOSIS — D689 Coagulation defect, unspecified: Secondary | ICD-10-CM | POA: Diagnosis not present

## 2022-10-05 ENCOUNTER — Other Ambulatory Visit: Payer: Self-pay

## 2022-10-05 ENCOUNTER — Emergency Department (HOSPITAL_BASED_OUTPATIENT_CLINIC_OR_DEPARTMENT_OTHER)
Admission: EM | Admit: 2022-10-05 | Discharge: 2022-10-05 | Discharge status: Against Medical Advice | Payer: 59 | Attending: Emergency Medicine | Admitting: Emergency Medicine

## 2022-10-05 ENCOUNTER — Encounter (HOSPITAL_BASED_OUTPATIENT_CLINIC_OR_DEPARTMENT_OTHER): Payer: Self-pay | Admitting: Emergency Medicine

## 2022-10-05 DIAGNOSIS — M79669 Pain in unspecified lower leg: Secondary | ICD-10-CM | POA: Insufficient documentation

## 2022-10-05 DIAGNOSIS — R52 Pain, unspecified: Secondary | ICD-10-CM | POA: Diagnosis not present

## 2022-10-05 DIAGNOSIS — Z5321 Procedure and treatment not carried out due to patient leaving prior to being seen by health care provider: Secondary | ICD-10-CM | POA: Diagnosis not present

## 2022-10-05 DIAGNOSIS — N186 End stage renal disease: Secondary | ICD-10-CM | POA: Diagnosis not present

## 2022-10-05 DIAGNOSIS — Y92410 Unspecified street and highway as the place of occurrence of the external cause: Secondary | ICD-10-CM | POA: Insufficient documentation

## 2022-10-05 DIAGNOSIS — R110 Nausea: Secondary | ICD-10-CM | POA: Diagnosis not present

## 2022-10-05 DIAGNOSIS — D631 Anemia in chronic kidney disease: Secondary | ICD-10-CM | POA: Diagnosis not present

## 2022-10-05 DIAGNOSIS — D689 Coagulation defect, unspecified: Secondary | ICD-10-CM | POA: Diagnosis not present

## 2022-10-05 DIAGNOSIS — M549 Dorsalgia, unspecified: Secondary | ICD-10-CM | POA: Insufficient documentation

## 2022-10-05 DIAGNOSIS — M62838 Other muscle spasm: Secondary | ICD-10-CM | POA: Insufficient documentation

## 2022-10-05 DIAGNOSIS — R209 Unspecified disturbances of skin sensation: Secondary | ICD-10-CM | POA: Diagnosis not present

## 2022-10-05 DIAGNOSIS — N2581 Secondary hyperparathyroidism of renal origin: Secondary | ICD-10-CM | POA: Diagnosis not present

## 2022-10-05 DIAGNOSIS — Z992 Dependence on renal dialysis: Secondary | ICD-10-CM | POA: Diagnosis not present

## 2022-10-05 DIAGNOSIS — M79606 Pain in leg, unspecified: Secondary | ICD-10-CM

## 2022-10-05 LAB — CBC AND DIFFERENTIAL
HCT: 40 — AB (ref 41–53)
Hemoglobin: 12.3 — AB (ref 13.5–17.5)
Platelets: 273 10*3/uL (ref 150–400)
WBC: 7.7

## 2022-10-05 LAB — BASIC METABOLIC PANEL
Creatinine: 15.8 — AB (ref 0.6–1.3)
Potassium: 5.2 mEq/L — AB (ref 3.5–5.1)

## 2022-10-05 LAB — CBC: RBC: 4.13 (ref 3.87–5.11)
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
## 2022-10-05 NOTE — ED Notes (Signed)
Patient expressed desire to leave. Does not wish to wait for provider to discharge. Patient reports pain is manageable to this time and was ambulatory to exit. Offer to have provider come talk to him, he did not wish to stay. Did not sign AMA paper work. Aox4 and verbalized that he would return if needed

## 2022-10-08 DIAGNOSIS — Z992 Dependence on renal dialysis: Secondary | ICD-10-CM | POA: Diagnosis not present

## 2022-10-08 DIAGNOSIS — R110 Nausea: Secondary | ICD-10-CM | POA: Diagnosis not present

## 2022-10-08 DIAGNOSIS — N2581 Secondary hyperparathyroidism of renal origin: Secondary | ICD-10-CM | POA: Diagnosis not present

## 2022-10-08 DIAGNOSIS — D631 Anemia in chronic kidney disease: Secondary | ICD-10-CM | POA: Diagnosis not present

## 2022-10-08 DIAGNOSIS — R209 Unspecified disturbances of skin sensation: Secondary | ICD-10-CM | POA: Diagnosis not present

## 2022-10-08 DIAGNOSIS — R52 Pain, unspecified: Secondary | ICD-10-CM | POA: Diagnosis not present

## 2022-10-08 DIAGNOSIS — N186 End stage renal disease: Secondary | ICD-10-CM | POA: Diagnosis not present

## 2022-10-08 DIAGNOSIS — D689 Coagulation defect, unspecified: Secondary | ICD-10-CM | POA: Diagnosis not present

## 2022-10-09 ENCOUNTER — Ambulatory Visit (HOSPITAL_BASED_OUTPATIENT_CLINIC_OR_DEPARTMENT_OTHER): Payer: 59 | Admitting: Family Medicine

## 2022-10-09 ENCOUNTER — Encounter (HOSPITAL_BASED_OUTPATIENT_CLINIC_OR_DEPARTMENT_OTHER): Payer: Self-pay | Admitting: Family Medicine

## 2022-10-09 VITALS — BP 128/79 | HR 94 | Ht 74.0 in | Wt 257.7 lb

## 2022-10-09 DIAGNOSIS — I150 Renovascular hypertension: Secondary | ICD-10-CM | POA: Diagnosis not present

## 2022-10-09 DIAGNOSIS — N186 End stage renal disease: Secondary | ICD-10-CM | POA: Diagnosis not present

## 2022-10-09 DIAGNOSIS — Z992 Dependence on renal dialysis: Secondary | ICD-10-CM | POA: Diagnosis not present

## 2022-10-09 DIAGNOSIS — M545 Low back pain, unspecified: Secondary | ICD-10-CM | POA: Diagnosis not present
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
## 2022-10-10 DIAGNOSIS — Z992 Dependence on renal dialysis: Secondary | ICD-10-CM | POA: Diagnosis not present

## 2022-10-10 DIAGNOSIS — N2581 Secondary hyperparathyroidism of renal origin: Secondary | ICD-10-CM | POA: Diagnosis not present

## 2022-10-10 DIAGNOSIS — R209 Unspecified disturbances of skin sensation: Secondary | ICD-10-CM | POA: Diagnosis not present

## 2022-10-10 DIAGNOSIS — D689 Coagulation defect, unspecified: Secondary | ICD-10-CM | POA: Diagnosis not present

## 2022-10-10 DIAGNOSIS — R110 Nausea: Secondary | ICD-10-CM | POA: Diagnosis not present

## 2022-10-10 DIAGNOSIS — D631 Anemia in chronic kidney disease: Secondary | ICD-10-CM | POA: Diagnosis not present

## 2022-10-10 DIAGNOSIS — N186 End stage renal disease: Secondary | ICD-10-CM | POA: Diagnosis not present

## 2022-10-10 DIAGNOSIS — R52 Pain, unspecified: Secondary | ICD-10-CM | POA: Diagnosis not present

## 2022-10-10 LAB — CBC AND DIFFERENTIAL
Hemoglobin: 12.3 — AB (ref 13.5–17.5)
Platelets: 273 10*3/uL (ref 150–400)
WBC: 7.7
WBC: 7.7

## 2022-10-10 LAB — COMPREHENSIVE METABOLIC PANEL
Albumin: 4 (ref 3.5–5.0)
Calcium: 8.4 — AB (ref 8.7–10.7)

## 2022-10-10 LAB — MICROALBUMIN / CREATININE URINE RATIO
Creatinine, Urine: 85.1 mg/dL
Microalb/Creat Ratio: 285 mg/g creat — ABNORMAL HIGH (ref 0–29)
Microalbumin, Urine: 242.7 ug/mL

## 2022-10-10 LAB — BASIC METABOLIC PANEL
Creatinine: 15.8 — AB (ref 0.6–1.3)
Potassium: 5.2 mEq/L — AB (ref 3.5–5.1)
Sodium: 139 (ref 137–147)

## 2022-10-12 ENCOUNTER — Telehealth: Payer: Self-pay

## 2022-10-12 DIAGNOSIS — N186 End stage renal disease: Secondary | ICD-10-CM | POA: Diagnosis not present

## 2022-10-12 DIAGNOSIS — D689 Coagulation defect, unspecified: Secondary | ICD-10-CM | POA: Diagnosis not present

## 2022-10-12 DIAGNOSIS — N2581 Secondary hyperparathyroidism of renal origin: Secondary | ICD-10-CM | POA: Diagnosis not present

## 2022-10-12 DIAGNOSIS — R52 Pain, unspecified: Secondary | ICD-10-CM | POA: Diagnosis not present

## 2022-10-12 DIAGNOSIS — R110 Nausea: Secondary | ICD-10-CM | POA: Diagnosis not present

## 2022-10-12 DIAGNOSIS — Z992 Dependence on renal dialysis: Secondary | ICD-10-CM | POA: Diagnosis not present

## 2022-10-12 DIAGNOSIS — D631 Anemia in chronic kidney disease: Secondary | ICD-10-CM | POA: Diagnosis not present

## 2022-10-12 DIAGNOSIS — R209 Unspecified disturbances of skin sensation: Secondary | ICD-10-CM | POA: Diagnosis not present

## 2022-10-12 NOTE — Telephone Encounter (Signed)
Transition Care Management Unsuccessful Follow-up Telephone Call  Date of discharge and from where:  [facility] 7/19  Attempts:  1st Attempt  Reason for unsuccessful TCM follow-up call:  No answer/busy   Lenard Forth [facility] Guide, [facility] [phone] [address] [place], [place], [place] [address] Phone: [phone] Email: [email or web address]

## 2022-10-12 NOTE — Telephone Encounter (Signed)
Transition Care Management Unsuccessful Follow-up Telephone Call  Date of discharge and from where:  [facility] 7/19  Attempts:  2nd Attempt  Reason for unsuccessful TCM follow-up call:  No answer/busy   Lenard Forth [facility] Guide, [facility] [phone] [address] [place], [place], [place] [address] Phone: [phone] Email: [email or web address]

## 2022-10-15 DIAGNOSIS — N186 End stage renal disease: Secondary | ICD-10-CM | POA: Diagnosis not present

## 2022-10-15 DIAGNOSIS — N2581 Secondary hyperparathyroidism of renal origin: Secondary | ICD-10-CM | POA: Diagnosis not present

## 2022-10-15 DIAGNOSIS — R110 Nausea: Secondary | ICD-10-CM | POA: Diagnosis not present

## 2022-10-15 DIAGNOSIS — R209 Unspecified disturbances of skin sensation: Secondary | ICD-10-CM | POA: Diagnosis not present

## 2022-10-15 DIAGNOSIS — Z992 Dependence on renal dialysis: Secondary | ICD-10-CM | POA: Diagnosis not present

## 2022-10-15 DIAGNOSIS — D631 Anemia in chronic kidney disease: Secondary | ICD-10-CM | POA: Diagnosis not present

## 2022-10-15 DIAGNOSIS — D689 Coagulation defect, unspecified: Secondary | ICD-10-CM | POA: Diagnosis not present

## 2022-10-15 DIAGNOSIS — R52 Pain, unspecified: Secondary | ICD-10-CM | POA: Diagnosis not present

## 2022-10-17 ENCOUNTER — Encounter (HOSPITAL_BASED_OUTPATIENT_CLINIC_OR_DEPARTMENT_OTHER): Payer: Self-pay | Admitting: Family Medicine

## 2022-10-17 DIAGNOSIS — R110 Nausea: Secondary | ICD-10-CM | POA: Diagnosis not present

## 2022-10-17 DIAGNOSIS — R209 Unspecified disturbances of skin sensation: Secondary | ICD-10-CM | POA: Diagnosis not present

## 2022-10-17 DIAGNOSIS — D689 Coagulation defect, unspecified: Secondary | ICD-10-CM | POA: Diagnosis not present

## 2022-10-17 DIAGNOSIS — R52 Pain, unspecified: Secondary | ICD-10-CM | POA: Diagnosis not present

## 2022-10-17 DIAGNOSIS — Z992 Dependence on renal dialysis: Secondary | ICD-10-CM | POA: Diagnosis not present

## 2022-10-17 DIAGNOSIS — D631 Anemia in chronic kidney disease: Secondary | ICD-10-CM | POA: Diagnosis not present

## 2022-10-17 DIAGNOSIS — N2581 Secondary hyperparathyroidism of renal origin: Secondary | ICD-10-CM | POA: Diagnosis not present

## 2022-10-17 DIAGNOSIS — N186 End stage renal disease: Secondary | ICD-10-CM | POA: Diagnosis not present

## 2022-10-17 NOTE — Progress Notes (Signed)
Records received from [facility] included some blood pressure readings provider would like entered into patients chart. Pre and post dialysis readings 07/22 Pre 147/79, Post 122/89, Pre Standing 159/95, Post standing 147/88 07/24 Pre 148/94, Post 163/95, Pre Standing 148/89, Post standing 151/104 07/26 Pre 180/58 Post 93/59, Pre standing 151/113, Post standing 111/74

## 2022-10-19 ENCOUNTER — Encounter (HOSPITAL_BASED_OUTPATIENT_CLINIC_OR_DEPARTMENT_OTHER): Payer: Self-pay | Admitting: Family Medicine

## 2022-10-19 DIAGNOSIS — D689 Coagulation defect, unspecified: Secondary | ICD-10-CM | POA: Diagnosis not present

## 2022-10-19 DIAGNOSIS — N2581 Secondary hyperparathyroidism of renal origin: Secondary | ICD-10-CM | POA: Diagnosis not present

## 2022-10-19 DIAGNOSIS — T7840XA Allergy, unspecified, initial encounter: Secondary | ICD-10-CM | POA: Diagnosis not present

## 2022-10-19 DIAGNOSIS — R209 Unspecified disturbances of skin sensation: Secondary | ICD-10-CM | POA: Diagnosis not present

## 2022-10-19 DIAGNOSIS — Z992 Dependence on renal dialysis: Secondary | ICD-10-CM | POA: Diagnosis not present

## 2022-10-19 DIAGNOSIS — N186 End stage renal disease: Secondary | ICD-10-CM | POA: Diagnosis not present

## 2022-10-19 DIAGNOSIS — R110 Nausea: Secondary | ICD-10-CM | POA: Diagnosis not present

## 2022-10-19 DIAGNOSIS — R52 Pain, unspecified: Secondary | ICD-10-CM | POA: Diagnosis not present

## 2022-10-19 DIAGNOSIS — D631 Anemia in chronic kidney disease: Secondary | ICD-10-CM | POA: Diagnosis not present

## 2022-10-22 DIAGNOSIS — N186 End stage renal disease: Secondary | ICD-10-CM | POA: Diagnosis not present

## 2022-10-22 DIAGNOSIS — R209 Unspecified disturbances of skin sensation: Secondary | ICD-10-CM | POA: Diagnosis not present

## 2022-10-22 DIAGNOSIS — R110 Nausea: Secondary | ICD-10-CM | POA: Diagnosis not present

## 2022-10-22 DIAGNOSIS — R52 Pain, unspecified: Secondary | ICD-10-CM | POA: Diagnosis not present

## 2022-10-22 DIAGNOSIS — D631 Anemia in chronic kidney disease: Secondary | ICD-10-CM | POA: Diagnosis not present

## 2022-10-22 DIAGNOSIS — T7840XA Allergy, unspecified, initial encounter: Secondary | ICD-10-CM | POA: Diagnosis not present

## 2022-10-22 DIAGNOSIS — Z992 Dependence on renal dialysis: Secondary | ICD-10-CM | POA: Diagnosis not present

## 2022-10-22 DIAGNOSIS — D689 Coagulation defect, unspecified: Secondary | ICD-10-CM | POA: Diagnosis not present

## 2022-10-22 DIAGNOSIS — N2581 Secondary hyperparathyroidism of renal origin: Secondary | ICD-10-CM | POA: Diagnosis not present

## 2022-10-25 DIAGNOSIS — Z992 Dependence on renal dialysis: Secondary | ICD-10-CM | POA: Diagnosis not present

## 2022-10-25 DIAGNOSIS — R52 Pain, unspecified: Secondary | ICD-10-CM | POA: Diagnosis not present

## 2022-10-25 DIAGNOSIS — N186 End stage renal disease: Secondary | ICD-10-CM | POA: Diagnosis not present

## 2022-10-25 DIAGNOSIS — R209 Unspecified disturbances of skin sensation: Secondary | ICD-10-CM | POA: Diagnosis not present

## 2022-10-25 DIAGNOSIS — N2581 Secondary hyperparathyroidism of renal origin: Secondary | ICD-10-CM | POA: Diagnosis not present

## 2022-10-25 DIAGNOSIS — T7840XA Allergy, unspecified, initial encounter: Secondary | ICD-10-CM | POA: Diagnosis not present

## 2022-10-25 DIAGNOSIS — D689 Coagulation defect, unspecified: Secondary | ICD-10-CM | POA: Diagnosis not present

## 2022-10-25 DIAGNOSIS — D631 Anemia in chronic kidney disease: Secondary | ICD-10-CM | POA: Diagnosis not present

## 2022-10-25 DIAGNOSIS — R110 Nausea: Secondary | ICD-10-CM | POA: Diagnosis not present

## 2022-10-26 DIAGNOSIS — R52 Pain, unspecified: Secondary | ICD-10-CM | POA: Diagnosis not present

## 2022-10-26 DIAGNOSIS — T7840XA Allergy, unspecified, initial encounter: Secondary | ICD-10-CM | POA: Diagnosis not present

## 2022-10-26 DIAGNOSIS — Z992 Dependence on renal dialysis: Secondary | ICD-10-CM | POA: Diagnosis not present

## 2022-10-26 DIAGNOSIS — N2581 Secondary hyperparathyroidism of renal origin: Secondary | ICD-10-CM | POA: Diagnosis not present

## 2022-10-26 DIAGNOSIS — D631 Anemia in chronic kidney disease: Secondary | ICD-10-CM | POA: Diagnosis not present

## 2022-10-26 DIAGNOSIS — R209 Unspecified disturbances of skin sensation: Secondary | ICD-10-CM | POA: Diagnosis not present

## 2022-10-26 DIAGNOSIS — N186 End stage renal disease: Secondary | ICD-10-CM | POA: Diagnosis not present

## 2022-10-26 DIAGNOSIS — D689 Coagulation defect, unspecified: Secondary | ICD-10-CM | POA: Diagnosis not present

## 2022-10-26 DIAGNOSIS — R110 Nausea: Secondary | ICD-10-CM | POA: Diagnosis not present

## 2022-10-29 DIAGNOSIS — R209 Unspecified disturbances of skin sensation: Secondary | ICD-10-CM | POA: Diagnosis not present

## 2022-10-29 DIAGNOSIS — N2581 Secondary hyperparathyroidism of renal origin: Secondary | ICD-10-CM | POA: Diagnosis not present

## 2022-10-29 DIAGNOSIS — Z992 Dependence on renal dialysis: Secondary | ICD-10-CM | POA: Diagnosis not present

## 2022-10-29 DIAGNOSIS — D689 Coagulation defect, unspecified: Secondary | ICD-10-CM | POA: Diagnosis not present

## 2022-10-29 DIAGNOSIS — R52 Pain, unspecified: Secondary | ICD-10-CM | POA: Diagnosis not present

## 2022-10-29 DIAGNOSIS — N186 End stage renal disease: Secondary | ICD-10-CM | POA: Diagnosis not present

## 2022-10-29 DIAGNOSIS — D631 Anemia in chronic kidney disease: Secondary | ICD-10-CM | POA: Diagnosis not present

## 2022-10-29 DIAGNOSIS — R110 Nausea: Secondary | ICD-10-CM | POA: Diagnosis not present

## 2022-10-29 DIAGNOSIS — T7840XA Allergy, unspecified, initial encounter: Secondary | ICD-10-CM | POA: Diagnosis not present

## 2022-10-31 DIAGNOSIS — N2581 Secondary hyperparathyroidism of renal origin: Secondary | ICD-10-CM | POA: Diagnosis not present

## 2022-10-31 DIAGNOSIS — T7840XA Allergy, unspecified, initial encounter: Secondary | ICD-10-CM | POA: Diagnosis not present

## 2022-10-31 DIAGNOSIS — D631 Anemia in chronic kidney disease: Secondary | ICD-10-CM | POA: Diagnosis not present

## 2022-10-31 DIAGNOSIS — Z992 Dependence on renal dialysis: Secondary | ICD-10-CM | POA: Diagnosis not present

## 2022-10-31 DIAGNOSIS — R209 Unspecified disturbances of skin sensation: Secondary | ICD-10-CM | POA: Diagnosis not present

## 2022-10-31 DIAGNOSIS — N186 End stage renal disease: Secondary | ICD-10-CM | POA: Diagnosis not present

## 2022-10-31 DIAGNOSIS — R52 Pain, unspecified: Secondary | ICD-10-CM | POA: Diagnosis not present

## 2022-10-31 DIAGNOSIS — D689 Coagulation defect, unspecified: Secondary | ICD-10-CM | POA: Diagnosis not present

## 2022-10-31 DIAGNOSIS — R110 Nausea: Secondary | ICD-10-CM | POA: Diagnosis not present

## 2022-11-02 DIAGNOSIS — N2581 Secondary hyperparathyroidism of renal origin: Secondary | ICD-10-CM | POA: Diagnosis not present

## 2022-11-02 DIAGNOSIS — R209 Unspecified disturbances of skin sensation: Secondary | ICD-10-CM | POA: Diagnosis not present

## 2022-11-02 DIAGNOSIS — R52 Pain, unspecified: Secondary | ICD-10-CM | POA: Diagnosis not present

## 2022-11-02 DIAGNOSIS — N186 End stage renal disease: Secondary | ICD-10-CM | POA: Diagnosis not present

## 2022-11-02 DIAGNOSIS — T7840XA Allergy, unspecified, initial encounter: Secondary | ICD-10-CM | POA: Diagnosis not present

## 2022-11-02 DIAGNOSIS — D689 Coagulation defect, unspecified: Secondary | ICD-10-CM | POA: Diagnosis not present

## 2022-11-02 DIAGNOSIS — D631 Anemia in chronic kidney disease: Secondary | ICD-10-CM | POA: Diagnosis not present

## 2022-11-02 DIAGNOSIS — Z992 Dependence on renal dialysis: Secondary | ICD-10-CM | POA: Diagnosis not present

## 2022-11-02 DIAGNOSIS — R110 Nausea: Secondary | ICD-10-CM | POA: Diagnosis not present

## 2022-11-05 DIAGNOSIS — N2581 Secondary hyperparathyroidism of renal origin: Secondary | ICD-10-CM | POA: Diagnosis not present

## 2022-11-05 DIAGNOSIS — N186 End stage renal disease: Secondary | ICD-10-CM | POA: Diagnosis not present

## 2022-11-05 DIAGNOSIS — R209 Unspecified disturbances of skin sensation: Secondary | ICD-10-CM | POA: Diagnosis not present

## 2022-11-05 DIAGNOSIS — T7840XA Allergy, unspecified, initial encounter: Secondary | ICD-10-CM | POA: Diagnosis not present

## 2022-11-05 DIAGNOSIS — R52 Pain, unspecified: Secondary | ICD-10-CM | POA: Diagnosis not present

## 2022-11-05 DIAGNOSIS — D631 Anemia in chronic kidney disease: Secondary | ICD-10-CM | POA: Diagnosis not present

## 2022-11-05 DIAGNOSIS — D689 Coagulation defect, unspecified: Secondary | ICD-10-CM | POA: Diagnosis not present

## 2022-11-05 DIAGNOSIS — Z992 Dependence on renal dialysis: Secondary | ICD-10-CM | POA: Diagnosis not present

## 2022-11-05 DIAGNOSIS — R110 Nausea: Secondary | ICD-10-CM | POA: Diagnosis not present

## 2022-11-07 DIAGNOSIS — N2581 Secondary hyperparathyroidism of renal origin: Secondary | ICD-10-CM | POA: Diagnosis not present

## 2022-11-07 DIAGNOSIS — N186 End stage renal disease: Secondary | ICD-10-CM | POA: Diagnosis not present

## 2022-11-07 DIAGNOSIS — R110 Nausea: Secondary | ICD-10-CM | POA: Diagnosis not present

## 2022-11-07 DIAGNOSIS — T7840XA Allergy, unspecified, initial encounter: Secondary | ICD-10-CM | POA: Diagnosis not present

## 2022-11-07 DIAGNOSIS — R209 Unspecified disturbances of skin sensation: Secondary | ICD-10-CM | POA: Diagnosis not present

## 2022-11-07 DIAGNOSIS — R52 Pain, unspecified: Secondary | ICD-10-CM | POA: Diagnosis not present

## 2022-11-07 DIAGNOSIS — Z992 Dependence on renal dialysis: Secondary | ICD-10-CM | POA: Diagnosis not present

## 2022-11-07 DIAGNOSIS — D631 Anemia in chronic kidney disease: Secondary | ICD-10-CM | POA: Diagnosis not present

## 2022-11-07 DIAGNOSIS — D689 Coagulation defect, unspecified: Secondary | ICD-10-CM | POA: Diagnosis not present

## 2022-11-09 DIAGNOSIS — N2581 Secondary hyperparathyroidism of renal origin: Secondary | ICD-10-CM | POA: Diagnosis not present

## 2022-11-09 DIAGNOSIS — D631 Anemia in chronic kidney disease: Secondary | ICD-10-CM | POA: Diagnosis not present

## 2022-11-09 DIAGNOSIS — R209 Unspecified disturbances of skin sensation: Secondary | ICD-10-CM | POA: Diagnosis not present

## 2022-11-09 DIAGNOSIS — D689 Coagulation defect, unspecified: Secondary | ICD-10-CM | POA: Diagnosis not present

## 2022-11-09 DIAGNOSIS — Z992 Dependence on renal dialysis: Secondary | ICD-10-CM | POA: Diagnosis not present

## 2022-11-09 DIAGNOSIS — T7840XA Allergy, unspecified, initial encounter: Secondary | ICD-10-CM | POA: Diagnosis not present

## 2022-11-09 DIAGNOSIS — R110 Nausea: Secondary | ICD-10-CM | POA: Diagnosis not present

## 2022-11-09 DIAGNOSIS — R52 Pain, unspecified: Secondary | ICD-10-CM | POA: Diagnosis not present

## 2022-11-09 DIAGNOSIS — N186 End stage renal disease: Secondary | ICD-10-CM | POA: Diagnosis not present

## 2022-11-12 DIAGNOSIS — Z992 Dependence on renal dialysis: Secondary | ICD-10-CM | POA: Diagnosis not present

## 2022-11-12 DIAGNOSIS — D689 Coagulation defect, unspecified: Secondary | ICD-10-CM | POA: Diagnosis not present

## 2022-11-12 DIAGNOSIS — T7840XA Allergy, unspecified, initial encounter: Secondary | ICD-10-CM | POA: Diagnosis not present

## 2022-11-12 DIAGNOSIS — R52 Pain, unspecified: Secondary | ICD-10-CM | POA: Diagnosis not present

## 2022-11-12 DIAGNOSIS — R209 Unspecified disturbances of skin sensation: Secondary | ICD-10-CM | POA: Diagnosis not present

## 2022-11-12 DIAGNOSIS — N186 End stage renal disease: Secondary | ICD-10-CM | POA: Diagnosis not present

## 2022-11-12 DIAGNOSIS — R110 Nausea: Secondary | ICD-10-CM | POA: Diagnosis not present

## 2022-11-12 DIAGNOSIS — N2581 Secondary hyperparathyroidism of renal origin: Secondary | ICD-10-CM | POA: Diagnosis not present

## 2022-11-12 DIAGNOSIS — D631 Anemia in chronic kidney disease: Secondary | ICD-10-CM | POA: Diagnosis not present

## 2022-11-14 DIAGNOSIS — N186 End stage renal disease: Secondary | ICD-10-CM | POA: Diagnosis not present

## 2022-11-14 DIAGNOSIS — R209 Unspecified disturbances of skin sensation: Secondary | ICD-10-CM | POA: Diagnosis not present

## 2022-11-14 DIAGNOSIS — R110 Nausea: Secondary | ICD-10-CM | POA: Diagnosis not present

## 2022-11-14 DIAGNOSIS — T7840XA Allergy, unspecified, initial encounter: Secondary | ICD-10-CM | POA: Diagnosis not present

## 2022-11-14 DIAGNOSIS — N2581 Secondary hyperparathyroidism of renal origin: Secondary | ICD-10-CM | POA: Diagnosis not present

## 2022-11-14 DIAGNOSIS — D689 Coagulation defect, unspecified: Secondary | ICD-10-CM | POA: Diagnosis not present

## 2022-11-14 DIAGNOSIS — R52 Pain, unspecified: Secondary | ICD-10-CM | POA: Diagnosis not present

## 2022-11-14 DIAGNOSIS — Z992 Dependence on renal dialysis: Secondary | ICD-10-CM | POA: Diagnosis not present

## 2022-11-14 DIAGNOSIS — D631 Anemia in chronic kidney disease: Secondary | ICD-10-CM | POA: Diagnosis not present

## 2022-11-16 DIAGNOSIS — R52 Pain, unspecified: Secondary | ICD-10-CM | POA: Diagnosis not present

## 2022-11-16 DIAGNOSIS — D689 Coagulation defect, unspecified: Secondary | ICD-10-CM | POA: Diagnosis not present

## 2022-11-16 DIAGNOSIS — Z992 Dependence on renal dialysis: Secondary | ICD-10-CM | POA: Diagnosis not present

## 2022-11-16 DIAGNOSIS — N186 End stage renal disease: Secondary | ICD-10-CM | POA: Diagnosis not present

## 2022-11-16 DIAGNOSIS — R110 Nausea: Secondary | ICD-10-CM | POA: Diagnosis not present

## 2022-11-16 DIAGNOSIS — N2581 Secondary hyperparathyroidism of renal origin: Secondary | ICD-10-CM | POA: Diagnosis not present

## 2022-11-16 DIAGNOSIS — T7840XA Allergy, unspecified, initial encounter: Secondary | ICD-10-CM | POA: Diagnosis not present

## 2022-11-16 DIAGNOSIS — R209 Unspecified disturbances of skin sensation: Secondary | ICD-10-CM | POA: Diagnosis not present

## 2022-11-16 DIAGNOSIS — D631 Anemia in chronic kidney disease: Secondary | ICD-10-CM | POA: Diagnosis not present

## 2022-11-17 DIAGNOSIS — N186 End stage renal disease: Secondary | ICD-10-CM | POA: Diagnosis not present

## 2022-11-17 DIAGNOSIS — Z992 Dependence on renal dialysis: Secondary | ICD-10-CM | POA: Diagnosis not present

## 2022-11-19 DIAGNOSIS — Z992 Dependence on renal dialysis: Secondary | ICD-10-CM | POA: Diagnosis not present

## 2022-11-19 DIAGNOSIS — D689 Coagulation defect, unspecified: Secondary | ICD-10-CM | POA: Diagnosis not present

## 2022-11-19 DIAGNOSIS — R110 Nausea: Secondary | ICD-10-CM | POA: Diagnosis not present

## 2022-11-19 DIAGNOSIS — R209 Unspecified disturbances of skin sensation: Secondary | ICD-10-CM | POA: Diagnosis not present

## 2022-11-19 DIAGNOSIS — D631 Anemia in chronic kidney disease: Secondary | ICD-10-CM | POA: Diagnosis not present

## 2022-11-19 DIAGNOSIS — N2581 Secondary hyperparathyroidism of renal origin: Secondary | ICD-10-CM | POA: Diagnosis not present

## 2022-11-19 DIAGNOSIS — R52 Pain, unspecified: Secondary | ICD-10-CM | POA: Diagnosis not present

## 2022-11-19 DIAGNOSIS — N186 End stage renal disease: Secondary | ICD-10-CM | POA: Diagnosis not present

## 2022-11-21 DIAGNOSIS — N2581 Secondary hyperparathyroidism of renal origin: Secondary | ICD-10-CM | POA: Diagnosis not present

## 2022-11-21 DIAGNOSIS — R110 Nausea: Secondary | ICD-10-CM | POA: Diagnosis not present

## 2022-11-21 DIAGNOSIS — Z992 Dependence on renal dialysis: Secondary | ICD-10-CM | POA: Diagnosis not present

## 2022-11-21 DIAGNOSIS — D631 Anemia in chronic kidney disease: Secondary | ICD-10-CM | POA: Diagnosis not present

## 2022-11-21 DIAGNOSIS — R209 Unspecified disturbances of skin sensation: Secondary | ICD-10-CM | POA: Diagnosis not present

## 2022-11-21 DIAGNOSIS — D689 Coagulation defect, unspecified: Secondary | ICD-10-CM | POA: Diagnosis not present

## 2022-11-21 DIAGNOSIS — N186 End stage renal disease: Secondary | ICD-10-CM | POA: Diagnosis not present

## 2022-11-21 DIAGNOSIS — R52 Pain, unspecified: Secondary | ICD-10-CM | POA: Diagnosis not present

## 2022-11-23 DIAGNOSIS — D631 Anemia in chronic kidney disease: Secondary | ICD-10-CM | POA: Diagnosis not present

## 2022-11-23 DIAGNOSIS — D689 Coagulation defect, unspecified: Secondary | ICD-10-CM | POA: Diagnosis not present

## 2022-11-23 DIAGNOSIS — R110 Nausea: Secondary | ICD-10-CM | POA: Diagnosis not present

## 2022-11-23 DIAGNOSIS — R209 Unspecified disturbances of skin sensation: Secondary | ICD-10-CM | POA: Diagnosis not present

## 2022-11-23 DIAGNOSIS — N186 End stage renal disease: Secondary | ICD-10-CM | POA: Diagnosis not present

## 2022-11-23 DIAGNOSIS — R52 Pain, unspecified: Secondary | ICD-10-CM | POA: Diagnosis not present

## 2022-11-23 DIAGNOSIS — Z992 Dependence on renal dialysis: Secondary | ICD-10-CM | POA: Diagnosis not present

## 2022-11-23 DIAGNOSIS — N2581 Secondary hyperparathyroidism of renal origin: Secondary | ICD-10-CM | POA: Diagnosis not present

## 2022-11-26 DIAGNOSIS — D631 Anemia in chronic kidney disease: Secondary | ICD-10-CM | POA: Diagnosis not present

## 2022-11-26 DIAGNOSIS — N186 End stage renal disease: Secondary | ICD-10-CM | POA: Diagnosis not present

## 2022-11-26 DIAGNOSIS — R110 Nausea: Secondary | ICD-10-CM | POA: Diagnosis not present

## 2022-11-26 DIAGNOSIS — D689 Coagulation defect, unspecified: Secondary | ICD-10-CM | POA: Diagnosis not present

## 2022-11-26 DIAGNOSIS — N2581 Secondary hyperparathyroidism of renal origin: Secondary | ICD-10-CM | POA: Diagnosis not present

## 2022-11-26 DIAGNOSIS — R52 Pain, unspecified: Secondary | ICD-10-CM | POA: Diagnosis not present

## 2022-11-26 DIAGNOSIS — Z992 Dependence on renal dialysis: Secondary | ICD-10-CM | POA: Diagnosis not present

## 2022-11-26 DIAGNOSIS — R209 Unspecified disturbances of skin sensation: Secondary | ICD-10-CM | POA: Diagnosis not present

## 2022-11-28 DIAGNOSIS — R110 Nausea: Secondary | ICD-10-CM | POA: Diagnosis not present

## 2022-11-28 DIAGNOSIS — D689 Coagulation defect, unspecified: Secondary | ICD-10-CM | POA: Diagnosis not present

## 2022-11-28 DIAGNOSIS — D631 Anemia in chronic kidney disease: Secondary | ICD-10-CM | POA: Diagnosis not present

## 2022-11-28 DIAGNOSIS — N186 End stage renal disease: Secondary | ICD-10-CM | POA: Diagnosis not present

## 2022-11-28 DIAGNOSIS — Z992 Dependence on renal dialysis: Secondary | ICD-10-CM | POA: Diagnosis not present

## 2022-11-28 DIAGNOSIS — N2581 Secondary hyperparathyroidism of renal origin: Secondary | ICD-10-CM | POA: Diagnosis not present

## 2022-11-28 DIAGNOSIS — R209 Unspecified disturbances of skin sensation: Secondary | ICD-10-CM | POA: Diagnosis not present

## 2022-11-28 DIAGNOSIS — R52 Pain, unspecified: Secondary | ICD-10-CM | POA: Diagnosis not present

## 2022-11-30 DIAGNOSIS — D631 Anemia in chronic kidney disease: Secondary | ICD-10-CM | POA: Diagnosis not present

## 2022-11-30 DIAGNOSIS — R110 Nausea: Secondary | ICD-10-CM | POA: Diagnosis not present

## 2022-11-30 DIAGNOSIS — R209 Unspecified disturbances of skin sensation: Secondary | ICD-10-CM | POA: Diagnosis not present

## 2022-11-30 DIAGNOSIS — Z992 Dependence on renal dialysis: Secondary | ICD-10-CM | POA: Diagnosis not present

## 2022-11-30 DIAGNOSIS — N186 End stage renal disease: Secondary | ICD-10-CM | POA: Diagnosis not present

## 2022-11-30 DIAGNOSIS — D689 Coagulation defect, unspecified: Secondary | ICD-10-CM | POA: Diagnosis not present

## 2022-11-30 DIAGNOSIS — R52 Pain, unspecified: Secondary | ICD-10-CM | POA: Diagnosis not present

## 2022-11-30 DIAGNOSIS — N2581 Secondary hyperparathyroidism of renal origin: Secondary | ICD-10-CM | POA: Diagnosis not present

## 2022-12-03 DIAGNOSIS — N186 End stage renal disease: Secondary | ICD-10-CM | POA: Diagnosis not present

## 2022-12-03 DIAGNOSIS — R110 Nausea: Secondary | ICD-10-CM | POA: Diagnosis not present

## 2022-12-03 DIAGNOSIS — D689 Coagulation defect, unspecified: Secondary | ICD-10-CM | POA: Diagnosis not present

## 2022-12-03 DIAGNOSIS — N2581 Secondary hyperparathyroidism of renal origin: Secondary | ICD-10-CM | POA: Diagnosis not present

## 2022-12-03 DIAGNOSIS — D631 Anemia in chronic kidney disease: Secondary | ICD-10-CM | POA: Diagnosis not present

## 2022-12-03 DIAGNOSIS — Z992 Dependence on renal dialysis: Secondary | ICD-10-CM | POA: Diagnosis not present

## 2022-12-03 DIAGNOSIS — R52 Pain, unspecified: Secondary | ICD-10-CM | POA: Diagnosis not present

## 2022-12-03 DIAGNOSIS — R209 Unspecified disturbances of skin sensation: Secondary | ICD-10-CM | POA: Diagnosis not present

## 2022-12-04 ENCOUNTER — Ambulatory Visit (HOSPITAL_BASED_OUTPATIENT_CLINIC_OR_DEPARTMENT_OTHER): Payer: 59 | Admitting: Family Medicine

## 2022-12-05 DIAGNOSIS — D689 Coagulation defect, unspecified: Secondary | ICD-10-CM | POA: Diagnosis not present

## 2022-12-05 DIAGNOSIS — D631 Anemia in chronic kidney disease: Secondary | ICD-10-CM | POA: Diagnosis not present

## 2022-12-05 DIAGNOSIS — Z992 Dependence on renal dialysis: Secondary | ICD-10-CM | POA: Diagnosis not present

## 2022-12-05 DIAGNOSIS — R110 Nausea: Secondary | ICD-10-CM | POA: Diagnosis not present

## 2022-12-05 DIAGNOSIS — R209 Unspecified disturbances of skin sensation: Secondary | ICD-10-CM | POA: Diagnosis not present

## 2022-12-05 DIAGNOSIS — N2581 Secondary hyperparathyroidism of renal origin: Secondary | ICD-10-CM | POA: Diagnosis not present

## 2022-12-05 DIAGNOSIS — N186 End stage renal disease: Secondary | ICD-10-CM | POA: Diagnosis not present

## 2022-12-05 DIAGNOSIS — R52 Pain, unspecified: Secondary | ICD-10-CM | POA: Diagnosis not present

## 2022-12-07 DIAGNOSIS — N2581 Secondary hyperparathyroidism of renal origin: Secondary | ICD-10-CM | POA: Diagnosis not present

## 2022-12-07 DIAGNOSIS — R209 Unspecified disturbances of skin sensation: Secondary | ICD-10-CM | POA: Diagnosis not present

## 2022-12-07 DIAGNOSIS — D631 Anemia in chronic kidney disease: Secondary | ICD-10-CM | POA: Diagnosis not present

## 2022-12-07 DIAGNOSIS — R110 Nausea: Secondary | ICD-10-CM | POA: Diagnosis not present

## 2022-12-07 DIAGNOSIS — R52 Pain, unspecified: Secondary | ICD-10-CM | POA: Diagnosis not present

## 2022-12-07 DIAGNOSIS — Z992 Dependence on renal dialysis: Secondary | ICD-10-CM | POA: Diagnosis not present

## 2022-12-07 DIAGNOSIS — D689 Coagulation defect, unspecified: Secondary | ICD-10-CM | POA: Diagnosis not present

## 2022-12-07 DIAGNOSIS — N186 End stage renal disease: Secondary | ICD-10-CM | POA: Diagnosis not present

## 2022-12-10 DIAGNOSIS — N186 End stage renal disease: Secondary | ICD-10-CM | POA: Diagnosis not present

## 2022-12-10 DIAGNOSIS — R52 Pain, unspecified: Secondary | ICD-10-CM | POA: Diagnosis not present

## 2022-12-10 DIAGNOSIS — Z992 Dependence on renal dialysis: Secondary | ICD-10-CM | POA: Diagnosis not present

## 2022-12-10 DIAGNOSIS — D689 Coagulation defect, unspecified: Secondary | ICD-10-CM | POA: Diagnosis not present

## 2022-12-10 DIAGNOSIS — N2581 Secondary hyperparathyroidism of renal origin: Secondary | ICD-10-CM | POA: Diagnosis not present

## 2022-12-10 DIAGNOSIS — D631 Anemia in chronic kidney disease: Secondary | ICD-10-CM | POA: Diagnosis not present

## 2022-12-10 DIAGNOSIS — R110 Nausea: Secondary | ICD-10-CM | POA: Diagnosis not present

## 2022-12-10 DIAGNOSIS — R209 Unspecified disturbances of skin sensation: Secondary | ICD-10-CM | POA: Diagnosis not present

## 2022-12-12 DIAGNOSIS — N186 End stage renal disease: Secondary | ICD-10-CM | POA: Diagnosis not present

## 2022-12-12 DIAGNOSIS — Z992 Dependence on renal dialysis: Secondary | ICD-10-CM | POA: Diagnosis not present

## 2022-12-12 DIAGNOSIS — N2581 Secondary hyperparathyroidism of renal origin: Secondary | ICD-10-CM | POA: Diagnosis not present

## 2022-12-12 DIAGNOSIS — R209 Unspecified disturbances of skin sensation: Secondary | ICD-10-CM | POA: Diagnosis not present

## 2022-12-12 DIAGNOSIS — R110 Nausea: Secondary | ICD-10-CM | POA: Diagnosis not present

## 2022-12-12 DIAGNOSIS — D689 Coagulation defect, unspecified: Secondary | ICD-10-CM | POA: Diagnosis not present

## 2022-12-12 DIAGNOSIS — D631 Anemia in chronic kidney disease: Secondary | ICD-10-CM | POA: Diagnosis not present

## 2022-12-12 DIAGNOSIS — R52 Pain, unspecified: Secondary | ICD-10-CM | POA: Diagnosis not present

## 2022-12-14 DIAGNOSIS — Z992 Dependence on renal dialysis: Secondary | ICD-10-CM | POA: Diagnosis not present

## 2022-12-14 DIAGNOSIS — N186 End stage renal disease: Secondary | ICD-10-CM | POA: Diagnosis not present

## 2022-12-14 DIAGNOSIS — D689 Coagulation defect, unspecified: Secondary | ICD-10-CM | POA: Diagnosis not present

## 2022-12-14 DIAGNOSIS — R52 Pain, unspecified: Secondary | ICD-10-CM | POA: Diagnosis not present

## 2022-12-14 DIAGNOSIS — N2581 Secondary hyperparathyroidism of renal origin: Secondary | ICD-10-CM | POA: Diagnosis not present

## 2022-12-14 DIAGNOSIS — R110 Nausea: Secondary | ICD-10-CM | POA: Diagnosis not present

## 2022-12-14 DIAGNOSIS — R209 Unspecified disturbances of skin sensation: Secondary | ICD-10-CM | POA: Diagnosis not present

## 2022-12-14 DIAGNOSIS — D631 Anemia in chronic kidney disease: Secondary | ICD-10-CM | POA: Diagnosis not present

## 2022-12-17 DIAGNOSIS — N2581 Secondary hyperparathyroidism of renal origin: Secondary | ICD-10-CM | POA: Diagnosis not present

## 2022-12-17 DIAGNOSIS — N186 End stage renal disease: Secondary | ICD-10-CM | POA: Diagnosis not present

## 2022-12-17 DIAGNOSIS — R209 Unspecified disturbances of skin sensation: Secondary | ICD-10-CM | POA: Diagnosis not present

## 2022-12-17 DIAGNOSIS — D689 Coagulation defect, unspecified: Secondary | ICD-10-CM | POA: Diagnosis not present

## 2022-12-17 DIAGNOSIS — R110 Nausea: Secondary | ICD-10-CM | POA: Diagnosis not present

## 2022-12-17 DIAGNOSIS — R52 Pain, unspecified: Secondary | ICD-10-CM | POA: Diagnosis not present

## 2022-12-17 DIAGNOSIS — Z992 Dependence on renal dialysis: Secondary | ICD-10-CM | POA: Diagnosis not present

## 2022-12-17 DIAGNOSIS — D631 Anemia in chronic kidney disease: Secondary | ICD-10-CM | POA: Diagnosis not present

## 2022-12-19 DIAGNOSIS — T7840XA Allergy, unspecified, initial encounter: Secondary | ICD-10-CM | POA: Diagnosis not present

## 2022-12-19 DIAGNOSIS — D631 Anemia in chronic kidney disease: Secondary | ICD-10-CM | POA: Diagnosis not present

## 2022-12-19 DIAGNOSIS — L299 Pruritus, unspecified: Secondary | ICD-10-CM | POA: Diagnosis not present

## 2022-12-19 DIAGNOSIS — R52 Pain, unspecified: Secondary | ICD-10-CM | POA: Diagnosis not present

## 2022-12-19 DIAGNOSIS — R110 Nausea: Secondary | ICD-10-CM | POA: Diagnosis not present

## 2022-12-19 DIAGNOSIS — Z992 Dependence on renal dialysis: Secondary | ICD-10-CM | POA: Diagnosis not present

## 2022-12-19 DIAGNOSIS — R209 Unspecified disturbances of skin sensation: Secondary | ICD-10-CM | POA: Diagnosis not present

## 2022-12-19 DIAGNOSIS — N2581 Secondary hyperparathyroidism of renal origin: Secondary | ICD-10-CM | POA: Diagnosis not present

## 2022-12-19 DIAGNOSIS — D689 Coagulation defect, unspecified: Secondary | ICD-10-CM | POA: Diagnosis not present

## 2022-12-19 DIAGNOSIS — N186 End stage renal disease: Secondary | ICD-10-CM | POA: Diagnosis not present

## 2022-12-19 DIAGNOSIS — Z23 Encounter for immunization: Secondary | ICD-10-CM | POA: Diagnosis not present

## 2022-12-21 DIAGNOSIS — N2581 Secondary hyperparathyroidism of renal origin: Secondary | ICD-10-CM | POA: Diagnosis not present

## 2022-12-21 DIAGNOSIS — N186 End stage renal disease: Secondary | ICD-10-CM | POA: Diagnosis not present

## 2022-12-21 DIAGNOSIS — R110 Nausea: Secondary | ICD-10-CM | POA: Diagnosis not present

## 2022-12-21 DIAGNOSIS — R52 Pain, unspecified: Secondary | ICD-10-CM | POA: Diagnosis not present

## 2022-12-21 DIAGNOSIS — T7840XA Allergy, unspecified, initial encounter: Secondary | ICD-10-CM | POA: Diagnosis not present

## 2022-12-21 DIAGNOSIS — R209 Unspecified disturbances of skin sensation: Secondary | ICD-10-CM | POA: Diagnosis not present

## 2022-12-21 DIAGNOSIS — Z992 Dependence on renal dialysis: Secondary | ICD-10-CM | POA: Diagnosis not present

## 2022-12-21 DIAGNOSIS — D689 Coagulation defect, unspecified: Secondary | ICD-10-CM | POA: Diagnosis not present

## 2022-12-21 DIAGNOSIS — Z23 Encounter for immunization: Secondary | ICD-10-CM | POA: Diagnosis not present

## 2022-12-21 DIAGNOSIS — L299 Pruritus, unspecified: Secondary | ICD-10-CM | POA: Diagnosis not present

## 2022-12-21 DIAGNOSIS — D631 Anemia in chronic kidney disease: Secondary | ICD-10-CM | POA: Diagnosis not present

## 2022-12-24 DIAGNOSIS — Z23 Encounter for immunization: Secondary | ICD-10-CM | POA: Diagnosis not present

## 2022-12-24 DIAGNOSIS — R209 Unspecified disturbances of skin sensation: Secondary | ICD-10-CM | POA: Diagnosis not present

## 2022-12-24 DIAGNOSIS — T7840XA Allergy, unspecified, initial encounter: Secondary | ICD-10-CM | POA: Diagnosis not present

## 2022-12-24 DIAGNOSIS — R52 Pain, unspecified: Secondary | ICD-10-CM | POA: Diagnosis not present

## 2022-12-24 DIAGNOSIS — L299 Pruritus, unspecified: Secondary | ICD-10-CM | POA: Diagnosis not present

## 2022-12-24 DIAGNOSIS — R110 Nausea: Secondary | ICD-10-CM | POA: Diagnosis not present

## 2022-12-24 DIAGNOSIS — N186 End stage renal disease: Secondary | ICD-10-CM | POA: Diagnosis not present

## 2022-12-24 DIAGNOSIS — D689 Coagulation defect, unspecified: Secondary | ICD-10-CM | POA: Diagnosis not present

## 2022-12-24 DIAGNOSIS — Z992 Dependence on renal dialysis: Secondary | ICD-10-CM | POA: Diagnosis not present

## 2022-12-24 DIAGNOSIS — D631 Anemia in chronic kidney disease: Secondary | ICD-10-CM | POA: Diagnosis not present

## 2022-12-24 DIAGNOSIS — N2581 Secondary hyperparathyroidism of renal origin: Secondary | ICD-10-CM | POA: Diagnosis not present

## 2022-12-26 DIAGNOSIS — D689 Coagulation defect, unspecified: Secondary | ICD-10-CM | POA: Diagnosis not present

## 2022-12-26 DIAGNOSIS — L299 Pruritus, unspecified: Secondary | ICD-10-CM | POA: Diagnosis not present

## 2022-12-26 DIAGNOSIS — N2581 Secondary hyperparathyroidism of renal origin: Secondary | ICD-10-CM | POA: Diagnosis not present

## 2022-12-26 DIAGNOSIS — Z23 Encounter for immunization: Secondary | ICD-10-CM | POA: Diagnosis not present

## 2022-12-26 DIAGNOSIS — Z992 Dependence on renal dialysis: Secondary | ICD-10-CM | POA: Diagnosis not present

## 2022-12-26 DIAGNOSIS — D631 Anemia in chronic kidney disease: Secondary | ICD-10-CM | POA: Diagnosis not present

## 2022-12-26 DIAGNOSIS — N186 End stage renal disease: Secondary | ICD-10-CM | POA: Diagnosis not present

## 2022-12-26 DIAGNOSIS — R209 Unspecified disturbances of skin sensation: Secondary | ICD-10-CM | POA: Diagnosis not present

## 2022-12-26 DIAGNOSIS — R52 Pain, unspecified: Secondary | ICD-10-CM | POA: Diagnosis not present

## 2022-12-26 DIAGNOSIS — T7840XA Allergy, unspecified, initial encounter: Secondary | ICD-10-CM | POA: Diagnosis not present

## 2022-12-26 DIAGNOSIS — R110 Nausea: Secondary | ICD-10-CM | POA: Diagnosis not present

## 2022-12-28 DIAGNOSIS — L299 Pruritus, unspecified: Secondary | ICD-10-CM | POA: Diagnosis not present

## 2022-12-28 DIAGNOSIS — D689 Coagulation defect, unspecified: Secondary | ICD-10-CM | POA: Diagnosis not present

## 2022-12-28 DIAGNOSIS — T7840XA Allergy, unspecified, initial encounter: Secondary | ICD-10-CM | POA: Diagnosis not present

## 2022-12-28 DIAGNOSIS — R209 Unspecified disturbances of skin sensation: Secondary | ICD-10-CM | POA: Diagnosis not present

## 2022-12-28 DIAGNOSIS — Z23 Encounter for immunization: Secondary | ICD-10-CM | POA: Diagnosis not present

## 2022-12-28 DIAGNOSIS — N2581 Secondary hyperparathyroidism of renal origin: Secondary | ICD-10-CM | POA: Diagnosis not present

## 2022-12-28 DIAGNOSIS — N186 End stage renal disease: Secondary | ICD-10-CM | POA: Diagnosis not present

## 2022-12-28 DIAGNOSIS — R110 Nausea: Secondary | ICD-10-CM | POA: Diagnosis not present

## 2022-12-28 DIAGNOSIS — R52 Pain, unspecified: Secondary | ICD-10-CM | POA: Diagnosis not present

## 2022-12-28 DIAGNOSIS — Z992 Dependence on renal dialysis: Secondary | ICD-10-CM | POA: Diagnosis not present

## 2022-12-28 DIAGNOSIS — D631 Anemia in chronic kidney disease: Secondary | ICD-10-CM | POA: Diagnosis not present

## 2022-12-31 DIAGNOSIS — T7840XA Allergy, unspecified, initial encounter: Secondary | ICD-10-CM | POA: Diagnosis not present

## 2022-12-31 DIAGNOSIS — D631 Anemia in chronic kidney disease: Secondary | ICD-10-CM | POA: Diagnosis not present

## 2022-12-31 DIAGNOSIS — N186 End stage renal disease: Secondary | ICD-10-CM | POA: Diagnosis not present

## 2022-12-31 DIAGNOSIS — Z992 Dependence on renal dialysis: Secondary | ICD-10-CM | POA: Diagnosis not present

## 2022-12-31 DIAGNOSIS — L299 Pruritus, unspecified: Secondary | ICD-10-CM | POA: Diagnosis not present

## 2022-12-31 DIAGNOSIS — N2581 Secondary hyperparathyroidism of renal origin: Secondary | ICD-10-CM | POA: Diagnosis not present

## 2022-12-31 DIAGNOSIS — Z23 Encounter for immunization: Secondary | ICD-10-CM | POA: Diagnosis not present

## 2022-12-31 DIAGNOSIS — R52 Pain, unspecified: Secondary | ICD-10-CM | POA: Diagnosis not present

## 2022-12-31 DIAGNOSIS — D689 Coagulation defect, unspecified: Secondary | ICD-10-CM | POA: Diagnosis not present

## 2022-12-31 DIAGNOSIS — R110 Nausea: Secondary | ICD-10-CM | POA: Diagnosis not present

## 2022-12-31 DIAGNOSIS — R209 Unspecified disturbances of skin sensation: Secondary | ICD-10-CM | POA: Diagnosis not present

## 2023-01-02 DIAGNOSIS — Z992 Dependence on renal dialysis: Secondary | ICD-10-CM | POA: Diagnosis not present

## 2023-01-02 DIAGNOSIS — R110 Nausea: Secondary | ICD-10-CM | POA: Diagnosis not present

## 2023-01-02 DIAGNOSIS — D689 Coagulation defect, unspecified: Secondary | ICD-10-CM | POA: Diagnosis not present

## 2023-01-02 DIAGNOSIS — T7840XA Allergy, unspecified, initial encounter: Secondary | ICD-10-CM | POA: Diagnosis not present

## 2023-01-02 DIAGNOSIS — D631 Anemia in chronic kidney disease: Secondary | ICD-10-CM | POA: Diagnosis not present

## 2023-01-02 DIAGNOSIS — R209 Unspecified disturbances of skin sensation: Secondary | ICD-10-CM | POA: Diagnosis not present

## 2023-01-02 DIAGNOSIS — L299 Pruritus, unspecified: Secondary | ICD-10-CM | POA: Diagnosis not present

## 2023-01-02 DIAGNOSIS — Z23 Encounter for immunization: Secondary | ICD-10-CM | POA: Diagnosis not present

## 2023-01-02 DIAGNOSIS — N2581 Secondary hyperparathyroidism of renal origin: Secondary | ICD-10-CM | POA: Diagnosis not present

## 2023-01-02 DIAGNOSIS — R52 Pain, unspecified: Secondary | ICD-10-CM | POA: Diagnosis not present

## 2023-01-02 DIAGNOSIS — N186 End stage renal disease: Secondary | ICD-10-CM | POA: Diagnosis not present

## 2023-01-04 DIAGNOSIS — N2581 Secondary hyperparathyroidism of renal origin: Secondary | ICD-10-CM | POA: Diagnosis not present

## 2023-01-04 DIAGNOSIS — D631 Anemia in chronic kidney disease: Secondary | ICD-10-CM | POA: Diagnosis not present

## 2023-01-04 DIAGNOSIS — T7840XA Allergy, unspecified, initial encounter: Secondary | ICD-10-CM | POA: Diagnosis not present

## 2023-01-04 DIAGNOSIS — L299 Pruritus, unspecified: Secondary | ICD-10-CM | POA: Diagnosis not present

## 2023-01-04 DIAGNOSIS — D689 Coagulation defect, unspecified: Secondary | ICD-10-CM | POA: Diagnosis not present

## 2023-01-04 DIAGNOSIS — Z23 Encounter for immunization: Secondary | ICD-10-CM | POA: Diagnosis not present

## 2023-01-04 DIAGNOSIS — R110 Nausea: Secondary | ICD-10-CM | POA: Diagnosis not present

## 2023-01-04 DIAGNOSIS — N186 End stage renal disease: Secondary | ICD-10-CM | POA: Diagnosis not present

## 2023-01-04 DIAGNOSIS — R52 Pain, unspecified: Secondary | ICD-10-CM | POA: Diagnosis not present

## 2023-01-04 DIAGNOSIS — Z992 Dependence on renal dialysis: Secondary | ICD-10-CM | POA: Diagnosis not present

## 2023-01-04 DIAGNOSIS — R209 Unspecified disturbances of skin sensation: Secondary | ICD-10-CM | POA: Diagnosis not present

## 2023-01-07 DIAGNOSIS — R209 Unspecified disturbances of skin sensation: Secondary | ICD-10-CM | POA: Diagnosis not present

## 2023-01-07 DIAGNOSIS — N2581 Secondary hyperparathyroidism of renal origin: Secondary | ICD-10-CM | POA: Diagnosis not present

## 2023-01-07 DIAGNOSIS — T7840XA Allergy, unspecified, initial encounter: Secondary | ICD-10-CM | POA: Diagnosis not present

## 2023-01-07 DIAGNOSIS — D631 Anemia in chronic kidney disease: Secondary | ICD-10-CM | POA: Diagnosis not present

## 2023-01-07 DIAGNOSIS — Z992 Dependence on renal dialysis: Secondary | ICD-10-CM | POA: Diagnosis not present

## 2023-01-07 DIAGNOSIS — L299 Pruritus, unspecified: Secondary | ICD-10-CM | POA: Diagnosis not present

## 2023-01-07 DIAGNOSIS — R110 Nausea: Secondary | ICD-10-CM | POA: Diagnosis not present

## 2023-01-07 DIAGNOSIS — N186 End stage renal disease: Secondary | ICD-10-CM | POA: Diagnosis not present

## 2023-01-07 DIAGNOSIS — D689 Coagulation defect, unspecified: Secondary | ICD-10-CM | POA: Diagnosis not present

## 2023-01-07 DIAGNOSIS — Z23 Encounter for immunization: Secondary | ICD-10-CM | POA: Diagnosis not present

## 2023-01-07 DIAGNOSIS — R52 Pain, unspecified: Secondary | ICD-10-CM | POA: Diagnosis not present

## 2023-01-10 DIAGNOSIS — D689 Coagulation defect, unspecified: Secondary | ICD-10-CM | POA: Diagnosis not present

## 2023-01-10 DIAGNOSIS — D631 Anemia in chronic kidney disease: Secondary | ICD-10-CM | POA: Diagnosis not present

## 2023-01-10 DIAGNOSIS — R110 Nausea: Secondary | ICD-10-CM | POA: Diagnosis not present

## 2023-01-10 DIAGNOSIS — N2581 Secondary hyperparathyroidism of renal origin: Secondary | ICD-10-CM | POA: Diagnosis not present

## 2023-01-10 DIAGNOSIS — L299 Pruritus, unspecified: Secondary | ICD-10-CM | POA: Diagnosis not present

## 2023-01-10 DIAGNOSIS — R209 Unspecified disturbances of skin sensation: Secondary | ICD-10-CM | POA: Diagnosis not present

## 2023-01-10 DIAGNOSIS — T7840XA Allergy, unspecified, initial encounter: Secondary | ICD-10-CM | POA: Diagnosis not present

## 2023-01-10 DIAGNOSIS — N186 End stage renal disease: Secondary | ICD-10-CM | POA: Diagnosis not present

## 2023-01-10 DIAGNOSIS — R52 Pain, unspecified: Secondary | ICD-10-CM | POA: Diagnosis not present

## 2023-01-10 DIAGNOSIS — Z23 Encounter for immunization: Secondary | ICD-10-CM | POA: Diagnosis not present

## 2023-01-10 DIAGNOSIS — Z992 Dependence on renal dialysis: Secondary | ICD-10-CM | POA: Diagnosis not present

## 2023-01-11 DIAGNOSIS — N186 End stage renal disease: Secondary | ICD-10-CM | POA: Diagnosis not present

## 2023-01-11 DIAGNOSIS — Z992 Dependence on renal dialysis: Secondary | ICD-10-CM | POA: Diagnosis not present

## 2023-01-11 DIAGNOSIS — D689 Coagulation defect, unspecified: Secondary | ICD-10-CM | POA: Diagnosis not present

## 2023-01-11 DIAGNOSIS — R110 Nausea: Secondary | ICD-10-CM | POA: Diagnosis not present

## 2023-01-11 DIAGNOSIS — R52 Pain, unspecified: Secondary | ICD-10-CM | POA: Diagnosis not present

## 2023-01-11 DIAGNOSIS — T7840XA Allergy, unspecified, initial encounter: Secondary | ICD-10-CM | POA: Diagnosis not present

## 2023-01-11 DIAGNOSIS — Z23 Encounter for immunization: Secondary | ICD-10-CM | POA: Diagnosis not present

## 2023-01-11 DIAGNOSIS — R209 Unspecified disturbances of skin sensation: Secondary | ICD-10-CM | POA: Diagnosis not present

## 2023-01-11 DIAGNOSIS — D631 Anemia in chronic kidney disease: Secondary | ICD-10-CM | POA: Diagnosis not present

## 2023-01-11 DIAGNOSIS — N2581 Secondary hyperparathyroidism of renal origin: Secondary | ICD-10-CM | POA: Diagnosis not present

## 2023-01-11 DIAGNOSIS — L299 Pruritus, unspecified: Secondary | ICD-10-CM | POA: Diagnosis not present

## 2023-01-14 DIAGNOSIS — D689 Coagulation defect, unspecified: Secondary | ICD-10-CM | POA: Diagnosis not present

## 2023-01-14 DIAGNOSIS — Z23 Encounter for immunization: Secondary | ICD-10-CM | POA: Diagnosis not present

## 2023-01-14 DIAGNOSIS — N2581 Secondary hyperparathyroidism of renal origin: Secondary | ICD-10-CM | POA: Diagnosis not present

## 2023-01-14 DIAGNOSIS — L299 Pruritus, unspecified: Secondary | ICD-10-CM | POA: Diagnosis not present

## 2023-01-14 DIAGNOSIS — Z992 Dependence on renal dialysis: Secondary | ICD-10-CM | POA: Diagnosis not present

## 2023-01-14 DIAGNOSIS — R110 Nausea: Secondary | ICD-10-CM | POA: Diagnosis not present

## 2023-01-14 DIAGNOSIS — T7840XA Allergy, unspecified, initial encounter: Secondary | ICD-10-CM | POA: Diagnosis not present

## 2023-01-14 DIAGNOSIS — R209 Unspecified disturbances of skin sensation: Secondary | ICD-10-CM | POA: Diagnosis not present

## 2023-01-14 DIAGNOSIS — R52 Pain, unspecified: Secondary | ICD-10-CM | POA: Diagnosis not present

## 2023-01-14 DIAGNOSIS — D631 Anemia in chronic kidney disease: Secondary | ICD-10-CM | POA: Diagnosis not present

## 2023-01-14 DIAGNOSIS — N186 End stage renal disease: Secondary | ICD-10-CM | POA: Diagnosis not present

## 2023-01-16 DIAGNOSIS — Z23 Encounter for immunization: Secondary | ICD-10-CM | POA: Diagnosis not present

## 2023-01-16 DIAGNOSIS — R110 Nausea: Secondary | ICD-10-CM | POA: Diagnosis not present

## 2023-01-16 DIAGNOSIS — L299 Pruritus, unspecified: Secondary | ICD-10-CM | POA: Diagnosis not present

## 2023-01-16 DIAGNOSIS — N2581 Secondary hyperparathyroidism of renal origin: Secondary | ICD-10-CM | POA: Diagnosis not present

## 2023-01-16 DIAGNOSIS — N186 End stage renal disease: Secondary | ICD-10-CM | POA: Diagnosis not present

## 2023-01-16 DIAGNOSIS — T7840XA Allergy, unspecified, initial encounter: Secondary | ICD-10-CM | POA: Diagnosis not present

## 2023-01-16 DIAGNOSIS — R209 Unspecified disturbances of skin sensation: Secondary | ICD-10-CM | POA: Diagnosis not present

## 2023-01-16 DIAGNOSIS — Z992 Dependence on renal dialysis: Secondary | ICD-10-CM | POA: Diagnosis not present

## 2023-01-16 DIAGNOSIS — D689 Coagulation defect, unspecified: Secondary | ICD-10-CM | POA: Diagnosis not present

## 2023-01-16 DIAGNOSIS — R52 Pain, unspecified: Secondary | ICD-10-CM | POA: Diagnosis not present

## 2023-01-16 DIAGNOSIS — D631 Anemia in chronic kidney disease: Secondary | ICD-10-CM | POA: Diagnosis not present

## 2023-01-17 DIAGNOSIS — Z992 Dependence on renal dialysis: Secondary | ICD-10-CM | POA: Diagnosis not present

## 2023-01-17 DIAGNOSIS — N186 End stage renal disease: Secondary | ICD-10-CM | POA: Diagnosis not present

## 2023-01-18 DIAGNOSIS — N186 End stage renal disease: Secondary | ICD-10-CM | POA: Diagnosis not present

## 2023-01-18 DIAGNOSIS — R52 Pain, unspecified: Secondary | ICD-10-CM | POA: Diagnosis not present

## 2023-01-18 DIAGNOSIS — D689 Coagulation defect, unspecified: Secondary | ICD-10-CM | POA: Diagnosis not present

## 2023-01-18 DIAGNOSIS — L299 Pruritus, unspecified: Secondary | ICD-10-CM | POA: Diagnosis not present

## 2023-01-18 DIAGNOSIS — R209 Unspecified disturbances of skin sensation: Secondary | ICD-10-CM | POA: Diagnosis not present

## 2023-01-18 DIAGNOSIS — N2581 Secondary hyperparathyroidism of renal origin: Secondary | ICD-10-CM | POA: Diagnosis not present

## 2023-01-18 DIAGNOSIS — T7840XA Allergy, unspecified, initial encounter: Secondary | ICD-10-CM | POA: Diagnosis not present

## 2023-01-18 DIAGNOSIS — D631 Anemia in chronic kidney disease: Secondary | ICD-10-CM | POA: Diagnosis not present

## 2023-01-18 DIAGNOSIS — Z992 Dependence on renal dialysis: Secondary | ICD-10-CM | POA: Diagnosis not present

## 2023-01-21 DIAGNOSIS — N2581 Secondary hyperparathyroidism of renal origin: Secondary | ICD-10-CM | POA: Diagnosis not present

## 2023-01-21 DIAGNOSIS — R209 Unspecified disturbances of skin sensation: Secondary | ICD-10-CM | POA: Diagnosis not present

## 2023-01-21 DIAGNOSIS — D631 Anemia in chronic kidney disease: Secondary | ICD-10-CM | POA: Diagnosis not present

## 2023-01-21 DIAGNOSIS — T7840XA Allergy, unspecified, initial encounter: Secondary | ICD-10-CM | POA: Diagnosis not present

## 2023-01-21 DIAGNOSIS — D689 Coagulation defect, unspecified: Secondary | ICD-10-CM | POA: Diagnosis not present

## 2023-01-21 DIAGNOSIS — N186 End stage renal disease: Secondary | ICD-10-CM | POA: Diagnosis not present

## 2023-01-21 DIAGNOSIS — R52 Pain, unspecified: Secondary | ICD-10-CM | POA: Diagnosis not present

## 2023-01-21 DIAGNOSIS — L299 Pruritus, unspecified: Secondary | ICD-10-CM | POA: Diagnosis not present

## 2023-01-21 DIAGNOSIS — Z992 Dependence on renal dialysis: Secondary | ICD-10-CM | POA: Diagnosis not present

## 2023-01-23 DIAGNOSIS — N186 End stage renal disease: Secondary | ICD-10-CM | POA: Diagnosis not present

## 2023-01-23 DIAGNOSIS — R209 Unspecified disturbances of skin sensation: Secondary | ICD-10-CM | POA: Diagnosis not present

## 2023-01-23 DIAGNOSIS — N2581 Secondary hyperparathyroidism of renal origin: Secondary | ICD-10-CM | POA: Diagnosis not present

## 2023-01-23 DIAGNOSIS — T7840XA Allergy, unspecified, initial encounter: Secondary | ICD-10-CM | POA: Diagnosis not present

## 2023-01-23 DIAGNOSIS — D689 Coagulation defect, unspecified: Secondary | ICD-10-CM | POA: Diagnosis not present

## 2023-01-23 DIAGNOSIS — R52 Pain, unspecified: Secondary | ICD-10-CM | POA: Diagnosis not present

## 2023-01-23 DIAGNOSIS — Z992 Dependence on renal dialysis: Secondary | ICD-10-CM | POA: Diagnosis not present

## 2023-01-23 DIAGNOSIS — L299 Pruritus, unspecified: Secondary | ICD-10-CM | POA: Diagnosis not present

## 2023-01-23 DIAGNOSIS — D631 Anemia in chronic kidney disease: Secondary | ICD-10-CM | POA: Diagnosis not present

## 2023-01-25 DIAGNOSIS — R209 Unspecified disturbances of skin sensation: Secondary | ICD-10-CM | POA: Diagnosis not present

## 2023-01-25 DIAGNOSIS — T7840XA Allergy, unspecified, initial encounter: Secondary | ICD-10-CM | POA: Diagnosis not present

## 2023-01-25 DIAGNOSIS — N2581 Secondary hyperparathyroidism of renal origin: Secondary | ICD-10-CM | POA: Diagnosis not present

## 2023-01-25 DIAGNOSIS — L299 Pruritus, unspecified: Secondary | ICD-10-CM | POA: Diagnosis not present

## 2023-01-25 DIAGNOSIS — D689 Coagulation defect, unspecified: Secondary | ICD-10-CM | POA: Diagnosis not present

## 2023-01-25 DIAGNOSIS — Z992 Dependence on renal dialysis: Secondary | ICD-10-CM | POA: Diagnosis not present

## 2023-01-25 DIAGNOSIS — R52 Pain, unspecified: Secondary | ICD-10-CM | POA: Diagnosis not present

## 2023-01-25 DIAGNOSIS — N186 End stage renal disease: Secondary | ICD-10-CM | POA: Diagnosis not present

## 2023-01-25 DIAGNOSIS — D631 Anemia in chronic kidney disease: Secondary | ICD-10-CM | POA: Diagnosis not present

## 2023-01-28 DIAGNOSIS — N2581 Secondary hyperparathyroidism of renal origin: Secondary | ICD-10-CM | POA: Diagnosis not present

## 2023-01-28 DIAGNOSIS — Z992 Dependence on renal dialysis: Secondary | ICD-10-CM | POA: Diagnosis not present

## 2023-01-28 DIAGNOSIS — D689 Coagulation defect, unspecified: Secondary | ICD-10-CM | POA: Diagnosis not present

## 2023-01-28 DIAGNOSIS — T7849XA Other allergy, initial encounter: Secondary | ICD-10-CM | POA: Diagnosis not present

## 2023-01-28 DIAGNOSIS — N186 End stage renal disease: Secondary | ICD-10-CM | POA: Diagnosis not present

## 2023-01-30 DIAGNOSIS — T7849XA Other allergy, initial encounter: Secondary | ICD-10-CM | POA: Diagnosis not present

## 2023-01-30 DIAGNOSIS — Z992 Dependence on renal dialysis: Secondary | ICD-10-CM | POA: Diagnosis not present

## 2023-01-30 DIAGNOSIS — N186 End stage renal disease: Secondary | ICD-10-CM | POA: Diagnosis not present

## 2023-01-30 DIAGNOSIS — D689 Coagulation defect, unspecified: Secondary | ICD-10-CM | POA: Diagnosis not present

## 2023-01-30 DIAGNOSIS — N2581 Secondary hyperparathyroidism of renal origin: Secondary | ICD-10-CM | POA: Diagnosis not present

## 2023-02-01 DIAGNOSIS — Z992 Dependence on renal dialysis: Secondary | ICD-10-CM | POA: Diagnosis not present

## 2023-02-01 DIAGNOSIS — T7849XA Other allergy, initial encounter: Secondary | ICD-10-CM | POA: Diagnosis not present

## 2023-02-01 DIAGNOSIS — D689 Coagulation defect, unspecified: Secondary | ICD-10-CM | POA: Diagnosis not present

## 2023-02-01 DIAGNOSIS — N186 End stage renal disease: Secondary | ICD-10-CM | POA: Diagnosis not present

## 2023-02-01 DIAGNOSIS — N2581 Secondary hyperparathyroidism of renal origin: Secondary | ICD-10-CM | POA: Diagnosis not present

## 2023-02-04 DIAGNOSIS — Z992 Dependence on renal dialysis: Secondary | ICD-10-CM | POA: Diagnosis not present

## 2023-02-04 DIAGNOSIS — N186 End stage renal disease: Secondary | ICD-10-CM | POA: Diagnosis not present

## 2023-02-04 DIAGNOSIS — D689 Coagulation defect, unspecified: Secondary | ICD-10-CM | POA: Diagnosis not present

## 2023-02-04 DIAGNOSIS — N2581 Secondary hyperparathyroidism of renal origin: Secondary | ICD-10-CM | POA: Diagnosis not present

## 2023-02-04 DIAGNOSIS — T7849XA Other allergy, initial encounter: Secondary | ICD-10-CM | POA: Diagnosis not present

## 2023-02-06 DIAGNOSIS — N186 End stage renal disease: Secondary | ICD-10-CM | POA: Diagnosis not present

## 2023-02-06 DIAGNOSIS — D689 Coagulation defect, unspecified: Secondary | ICD-10-CM | POA: Diagnosis not present

## 2023-02-06 DIAGNOSIS — T7849XA Other allergy, initial encounter: Secondary | ICD-10-CM | POA: Diagnosis not present

## 2023-02-06 DIAGNOSIS — N2581 Secondary hyperparathyroidism of renal origin: Secondary | ICD-10-CM | POA: Diagnosis not present

## 2023-02-06 DIAGNOSIS — Z992 Dependence on renal dialysis: Secondary | ICD-10-CM | POA: Diagnosis not present

## 2023-02-07 ENCOUNTER — Encounter (HOSPITAL_BASED_OUTPATIENT_CLINIC_OR_DEPARTMENT_OTHER): Payer: Self-pay | Admitting: Family Medicine

## 2023-02-08 DIAGNOSIS — N186 End stage renal disease: Secondary | ICD-10-CM | POA: Diagnosis not present

## 2023-02-08 DIAGNOSIS — N2581 Secondary hyperparathyroidism of renal origin: Secondary | ICD-10-CM | POA: Diagnosis not present

## 2023-02-08 DIAGNOSIS — Z992 Dependence on renal dialysis: Secondary | ICD-10-CM | POA: Diagnosis not present

## 2023-02-08 DIAGNOSIS — T7849XA Other allergy, initial encounter: Secondary | ICD-10-CM | POA: Diagnosis not present

## 2023-02-08 DIAGNOSIS — D689 Coagulation defect, unspecified: Secondary | ICD-10-CM | POA: Diagnosis not present

## 2023-02-10 DIAGNOSIS — D689 Coagulation defect, unspecified: Secondary | ICD-10-CM | POA: Diagnosis not present

## 2023-02-10 DIAGNOSIS — N186 End stage renal disease: Secondary | ICD-10-CM | POA: Diagnosis not present

## 2023-02-10 DIAGNOSIS — Z992 Dependence on renal dialysis: Secondary | ICD-10-CM | POA: Diagnosis not present

## 2023-02-10 DIAGNOSIS — N2581 Secondary hyperparathyroidism of renal origin: Secondary | ICD-10-CM | POA: Diagnosis not present

## 2023-02-10 DIAGNOSIS — T7849XA Other allergy, initial encounter: Secondary | ICD-10-CM | POA: Diagnosis not present

## 2023-02-12 DIAGNOSIS — N186 End stage renal disease: Secondary | ICD-10-CM | POA: Diagnosis not present

## 2023-02-12 DIAGNOSIS — T7849XA Other allergy, initial encounter: Secondary | ICD-10-CM | POA: Diagnosis not present

## 2023-02-12 DIAGNOSIS — Z992 Dependence on renal dialysis: Secondary | ICD-10-CM | POA: Diagnosis not present

## 2023-02-12 DIAGNOSIS — N2581 Secondary hyperparathyroidism of renal origin: Secondary | ICD-10-CM | POA: Diagnosis not present

## 2023-02-12 DIAGNOSIS — D689 Coagulation defect, unspecified: Secondary | ICD-10-CM | POA: Diagnosis not present

## 2023-02-16 DIAGNOSIS — N186 End stage renal disease: Secondary | ICD-10-CM | POA: Diagnosis not present

## 2023-02-16 DIAGNOSIS — Z992 Dependence on renal dialysis: Secondary | ICD-10-CM | POA: Diagnosis not present

## 2023-02-18 DIAGNOSIS — N2581 Secondary hyperparathyroidism of renal origin: Secondary | ICD-10-CM | POA: Diagnosis not present

## 2023-02-18 DIAGNOSIS — Z992 Dependence on renal dialysis: Secondary | ICD-10-CM | POA: Diagnosis not present

## 2023-02-18 DIAGNOSIS — N186 End stage renal disease: Secondary | ICD-10-CM | POA: Diagnosis not present

## 2023-02-18 DIAGNOSIS — D689 Coagulation defect, unspecified: Secondary | ICD-10-CM | POA: Diagnosis not present

## 2023-02-20 DIAGNOSIS — N2581 Secondary hyperparathyroidism of renal origin: Secondary | ICD-10-CM | POA: Diagnosis not present

## 2023-02-20 DIAGNOSIS — Z992 Dependence on renal dialysis: Secondary | ICD-10-CM | POA: Diagnosis not present

## 2023-02-20 DIAGNOSIS — D689 Coagulation defect, unspecified: Secondary | ICD-10-CM | POA: Diagnosis not present

## 2023-02-20 DIAGNOSIS — N186 End stage renal disease: Secondary | ICD-10-CM | POA: Diagnosis not present

## 2023-02-22 DIAGNOSIS — D689 Coagulation defect, unspecified: Secondary | ICD-10-CM | POA: Diagnosis not present

## 2023-02-22 DIAGNOSIS — N186 End stage renal disease: Secondary | ICD-10-CM | POA: Diagnosis not present

## 2023-02-22 DIAGNOSIS — Z992 Dependence on renal dialysis: Secondary | ICD-10-CM | POA: Diagnosis not present

## 2023-02-22 DIAGNOSIS — N2581 Secondary hyperparathyroidism of renal origin: Secondary | ICD-10-CM | POA: Diagnosis not present

## 2023-02-25 DIAGNOSIS — D689 Coagulation defect, unspecified: Secondary | ICD-10-CM | POA: Diagnosis not present

## 2023-02-25 DIAGNOSIS — N186 End stage renal disease: Secondary | ICD-10-CM | POA: Diagnosis not present

## 2023-02-25 DIAGNOSIS — Z992 Dependence on renal dialysis: Secondary | ICD-10-CM | POA: Diagnosis not present

## 2023-02-25 DIAGNOSIS — N2581 Secondary hyperparathyroidism of renal origin: Secondary | ICD-10-CM | POA: Diagnosis not present

## 2023-02-27 DIAGNOSIS — Z992 Dependence on renal dialysis: Secondary | ICD-10-CM | POA: Diagnosis not present

## 2023-02-27 DIAGNOSIS — D689 Coagulation defect, unspecified: Secondary | ICD-10-CM | POA: Diagnosis not present

## 2023-02-27 DIAGNOSIS — N186 End stage renal disease: Secondary | ICD-10-CM | POA: Diagnosis not present

## 2023-02-27 DIAGNOSIS — N2581 Secondary hyperparathyroidism of renal origin: Secondary | ICD-10-CM | POA: Diagnosis not present

## 2023-03-01 DIAGNOSIS — N2581 Secondary hyperparathyroidism of renal origin: Secondary | ICD-10-CM | POA: Diagnosis not present

## 2023-03-01 DIAGNOSIS — N186 End stage renal disease: Secondary | ICD-10-CM | POA: Diagnosis not present

## 2023-03-01 DIAGNOSIS — Z992 Dependence on renal dialysis: Secondary | ICD-10-CM | POA: Diagnosis not present

## 2023-03-01 DIAGNOSIS — D689 Coagulation defect, unspecified: Secondary | ICD-10-CM | POA: Diagnosis not present

## 2023-03-04 DIAGNOSIS — N186 End stage renal disease: Secondary | ICD-10-CM | POA: Diagnosis not present

## 2023-03-04 DIAGNOSIS — N2581 Secondary hyperparathyroidism of renal origin: Secondary | ICD-10-CM | POA: Diagnosis not present

## 2023-03-04 DIAGNOSIS — Z992 Dependence on renal dialysis: Secondary | ICD-10-CM | POA: Diagnosis not present

## 2023-03-04 DIAGNOSIS — D689 Coagulation defect, unspecified: Secondary | ICD-10-CM | POA: Diagnosis not present

## 2023-03-06 DIAGNOSIS — Z992 Dependence on renal dialysis: Secondary | ICD-10-CM | POA: Diagnosis not present

## 2023-03-06 DIAGNOSIS — N2581 Secondary hyperparathyroidism of renal origin: Secondary | ICD-10-CM | POA: Diagnosis not present

## 2023-03-06 DIAGNOSIS — D689 Coagulation defect, unspecified: Secondary | ICD-10-CM | POA: Diagnosis not present

## 2023-03-06 DIAGNOSIS — N186 End stage renal disease: Secondary | ICD-10-CM | POA: Diagnosis not present

## 2023-03-08 DIAGNOSIS — Z992 Dependence on renal dialysis: Secondary | ICD-10-CM | POA: Diagnosis not present

## 2023-03-08 DIAGNOSIS — N186 End stage renal disease: Secondary | ICD-10-CM | POA: Diagnosis not present

## 2023-03-08 DIAGNOSIS — N2581 Secondary hyperparathyroidism of renal origin: Secondary | ICD-10-CM | POA: Diagnosis not present

## 2023-03-08 DIAGNOSIS — D689 Coagulation defect, unspecified: Secondary | ICD-10-CM | POA: Diagnosis not present

## 2023-03-10 DIAGNOSIS — N2581 Secondary hyperparathyroidism of renal origin: Secondary | ICD-10-CM | POA: Diagnosis not present

## 2023-03-10 DIAGNOSIS — D689 Coagulation defect, unspecified: Secondary | ICD-10-CM | POA: Diagnosis not present

## 2023-03-10 DIAGNOSIS — Z992 Dependence on renal dialysis: Secondary | ICD-10-CM | POA: Diagnosis not present

## 2023-03-10 DIAGNOSIS — N186 End stage renal disease: Secondary | ICD-10-CM | POA: Diagnosis not present

## 2023-03-12 DIAGNOSIS — N2581 Secondary hyperparathyroidism of renal origin: Secondary | ICD-10-CM | POA: Diagnosis not present

## 2023-03-12 DIAGNOSIS — N186 End stage renal disease: Secondary | ICD-10-CM | POA: Diagnosis not present

## 2023-03-12 DIAGNOSIS — D689 Coagulation defect, unspecified: Secondary | ICD-10-CM | POA: Diagnosis not present

## 2023-03-12 DIAGNOSIS — Z992 Dependence on renal dialysis: Secondary | ICD-10-CM | POA: Diagnosis not present

## 2023-03-16 DIAGNOSIS — D689 Coagulation defect, unspecified: Secondary | ICD-10-CM | POA: Diagnosis not present

## 2023-03-16 DIAGNOSIS — N2581 Secondary hyperparathyroidism of renal origin: Secondary | ICD-10-CM | POA: Diagnosis not present

## 2023-03-16 DIAGNOSIS — N186 End stage renal disease: Secondary | ICD-10-CM | POA: Diagnosis not present

## 2023-03-16 DIAGNOSIS — Z992 Dependence on renal dialysis: Secondary | ICD-10-CM | POA: Diagnosis not present

## 2023-03-17 DIAGNOSIS — D689 Coagulation defect, unspecified: Secondary | ICD-10-CM | POA: Diagnosis not present

## 2023-03-17 DIAGNOSIS — N2581 Secondary hyperparathyroidism of renal origin: Secondary | ICD-10-CM | POA: Diagnosis not present

## 2023-03-17 DIAGNOSIS — N186 End stage renal disease: Secondary | ICD-10-CM | POA: Diagnosis not present

## 2023-03-17 DIAGNOSIS — Z992 Dependence on renal dialysis: Secondary | ICD-10-CM | POA: Diagnosis not present

## 2023-03-19 DIAGNOSIS — Z992 Dependence on renal dialysis: Secondary | ICD-10-CM | POA: Diagnosis not present

## 2023-03-19 DIAGNOSIS — D689 Coagulation defect, unspecified: Secondary | ICD-10-CM | POA: Diagnosis not present

## 2023-03-19 DIAGNOSIS — N2581 Secondary hyperparathyroidism of renal origin: Secondary | ICD-10-CM | POA: Diagnosis not present

## 2023-03-19 DIAGNOSIS — N186 End stage renal disease: Secondary | ICD-10-CM | POA: Diagnosis not present

## 2023-03-22 DIAGNOSIS — N2581 Secondary hyperparathyroidism of renal origin: Secondary | ICD-10-CM | POA: Diagnosis not present

## 2023-03-22 DIAGNOSIS — N186 End stage renal disease: Secondary | ICD-10-CM | POA: Diagnosis not present

## 2023-03-22 DIAGNOSIS — Z992 Dependence on renal dialysis: Secondary | ICD-10-CM | POA: Diagnosis not present

## 2023-03-22 DIAGNOSIS — D689 Coagulation defect, unspecified: Secondary | ICD-10-CM | POA: Diagnosis not present

## 2023-03-25 DIAGNOSIS — N186 End stage renal disease: Secondary | ICD-10-CM | POA: Diagnosis not present

## 2023-03-25 DIAGNOSIS — D689 Coagulation defect, unspecified: Secondary | ICD-10-CM | POA: Diagnosis not present

## 2023-03-25 DIAGNOSIS — Z992 Dependence on renal dialysis: Secondary | ICD-10-CM | POA: Diagnosis not present

## 2023-03-25 DIAGNOSIS — N2581 Secondary hyperparathyroidism of renal origin: Secondary | ICD-10-CM | POA: Diagnosis not present

## 2023-03-26 ENCOUNTER — Encounter (HOSPITAL_COMMUNITY)
Admission: RE | Disposition: A | Discharge status: Home / Self Care | Payer: Self-pay | Source: Home / Self Care | Attending: Internal Medicine

## 2023-03-26 ENCOUNTER — Other Ambulatory Visit: Payer: Self-pay

## 2023-03-26 ENCOUNTER — Encounter (HOSPITAL_COMMUNITY): Payer: Self-pay | Admitting: Internal Medicine

## 2023-03-26 ENCOUNTER — Ambulatory Visit (HOSPITAL_COMMUNITY)
Admission: RE | Admit: 2023-03-26 | Discharge: 2023-03-26 | Disposition: A | Discharge status: Home / Self Care | Payer: 59 | Attending: Internal Medicine | Admitting: Internal Medicine

## 2023-03-26 DIAGNOSIS — E785 Hyperlipidemia, unspecified: Secondary | ICD-10-CM | POA: Insufficient documentation

## 2023-03-26 DIAGNOSIS — G4733 Obstructive sleep apnea (adult) (pediatric): Secondary | ICD-10-CM | POA: Diagnosis not present

## 2023-03-26 DIAGNOSIS — Q613 Polycystic kidney, unspecified: Secondary | ICD-10-CM | POA: Diagnosis not present

## 2023-03-26 DIAGNOSIS — M109 Gout, unspecified: Secondary | ICD-10-CM | POA: Insufficient documentation

## 2023-03-26 DIAGNOSIS — Z992 Dependence on renal dialysis: Secondary | ICD-10-CM | POA: Diagnosis not present

## 2023-03-26 DIAGNOSIS — N186 End stage renal disease: Secondary | ICD-10-CM | POA: Diagnosis not present

## 2023-03-26 DIAGNOSIS — I120 Hypertensive chronic kidney disease with stage 5 chronic kidney disease or end stage renal disease: Secondary | ICD-10-CM | POA: Diagnosis not present

## 2023-03-26 DIAGNOSIS — D631 Anemia in chronic kidney disease: Secondary | ICD-10-CM | POA: Insufficient documentation

## 2023-03-26 DIAGNOSIS — Z87891 Personal history of nicotine dependence: Secondary | ICD-10-CM | POA: Insufficient documentation

## 2023-03-26 DIAGNOSIS — Y832 Surgical operation with anastomosis, bypass or graft as the cause of abnormal reaction of the patient, or of later complication, without mention of misadventure at the time of the procedure: Secondary | ICD-10-CM | POA: Insufficient documentation

## 2023-03-26 DIAGNOSIS — T82858A Stenosis of vascular prosthetic devices, implants and grafts, initial encounter: Secondary | ICD-10-CM | POA: Insufficient documentation

## 2023-03-26 DIAGNOSIS — I871 Compression of vein: Secondary | ICD-10-CM | POA: Diagnosis not present

## 2023-03-26 HISTORY — PX: PERIPHERAL VASCULAR BALLOON ANGIOPLASTY: CATH118281

## 2023-03-26 HISTORY — PX: A/V FISTULAGRAM: CATH118298

## 2023-03-26 SURGERY — A/V FISTULAGRAM
Anesthesia: LOCAL

## 2023-03-26 MED ORDER — IODIXANOL 320 MG/ML IV SOLN
INTRAVENOUS | Status: DC | PRN
  Administered 2023-03-26: 10 mL

## 2023-03-26 MED ORDER — DIPHENHYDRAMINE HCL 50 MG/ML IJ SOLN
INTRAMUSCULAR | Status: AC
  Filled 2023-03-26: qty 1

## 2023-03-26 MED ORDER — MIDAZOLAM HCL 2 MG/2ML IJ SOLN
INTRAMUSCULAR | Status: AC
  Filled 2023-03-26: qty 2

## 2023-03-26 MED ORDER — HEPARIN (PORCINE) IN NACL 1000-0.9 UT/500ML-% IV SOLN
INTRAVENOUS | Status: DC | PRN
  Administered 2023-03-26: 500 mL

## 2023-03-26 MED ORDER — METHYLPREDNISOLONE SODIUM SUCC 125 MG IJ SOLR
INTRAMUSCULAR | Status: DC | PRN
  Administered 2023-03-26: 125 mg via INTRAVENOUS

## 2023-03-26 MED ORDER — LIDOCAINE HCL (PF) 1 % IJ SOLN
INTRAMUSCULAR | Status: DC | PRN
  Administered 2023-03-26: 2 mL via INTRADERMAL

## 2023-03-26 MED ORDER — MIDAZOLAM HCL 2 MG/2ML IJ SOLN
INTRAMUSCULAR | Status: DC | PRN
  Administered 2023-03-26: 1 mg via INTRAVENOUS

## 2023-03-26 MED ORDER — FENTANYL CITRATE (PF) 100 MCG/2ML IJ SOLN
INTRAMUSCULAR | Status: DC | PRN
  Administered 2023-03-26: 25 ug via INTRAVENOUS

## 2023-03-26 MED ORDER — METHYLPREDNISOLONE SODIUM SUCC 125 MG IJ SOLR
INTRAMUSCULAR | Status: AC
  Filled 2023-03-26: qty 2

## 2023-03-26 MED ORDER — FENTANYL CITRATE (PF) 100 MCG/2ML IJ SOLN
INTRAMUSCULAR | Status: AC
  Filled 2023-03-26: qty 2

## 2023-03-26 MED ORDER — DIPHENHYDRAMINE HCL 50 MG/ML IJ SOLN
INTRAMUSCULAR | Status: DC | PRN
  Administered 2023-03-26: 25 mg via INTRAVENOUS

## 2023-03-26 MED ORDER — LIDOCAINE HCL (PF) 1 % IJ SOLN
INTRAMUSCULAR | Status: AC
  Filled 2023-03-26: qty 30

## 2023-03-26 SURGICAL SUPPLY — 8 items
BALLN MUSTANG 8.0X40 75 (BALLOONS) ×2
BALLOON MUSTANG 8.0X40 75 (BALLOONS) IMPLANT
COVER DOME SNAP 22 D (MISCELLANEOUS) ×2 IMPLANT
GUIDEWIRE ANGLED .035X150CM (WIRE) IMPLANT
KIT MICROPUNCTURE NIT STIFF (SHEATH) IMPLANT
SHEATH PINNACLE R/O II 6F 4CM (SHEATH) IMPLANT
SYR MEDALLION 10ML (SYRINGE) IMPLANT
TRAY PV CATH (CUSTOM PROCEDURE TRAY) ×2 IMPLANT

## 2023-03-26 NOTE — H&P (Signed)
 [facility]  Vascular Access H&P  Reason for Consultation: AVG malfunction - decline in AF and low clearances Requesting Provider: Dr. Dolan  HPI: Michael [name] is an 48 y.o. male with ESRD on HD MWF, HTN, HL, anemia, PKD, OSA, gout who present for evaluation of AVG malfunction.   Dialysis reports low clearances and declining AF.  Pt notes no cannulation issues or prolonged bleeding.  He has a contrast allergy listed but says it's a small amount of itching, no h/o tongue or lip issues.  He initially said he premedicated but on further questioning did not .  He is not on anticoagulation.   PMH: Past Medical History:  Diagnosis Date   Anemia in neoplastic disease    Compression of vein    Disorder of phosphorus metabolism    End stage renal disease (HCC) 12/11/2019   Essential hypertension, malignant    Familial lipoprotein deficiency    GERD (gastroesophageal reflux disease)    Gout    History of renal dialysis    M-W-F   HLD (hyperlipidemia)    Hypertension    Intracranial aneurysm    s/p coil embolization: right middle meningeal artery 09/08/20, pericallosal artery 09/22/20, right MCA 12/22/20   Iron  deficiency anemia, unspecified    Melanoma in situ of eyelid (HCC)    Nontraumatic acute subdural hemorrhage (HCC)    Obesity, unspecified    Pneumonia    Polycystic kidney disease    Pruritus of skin    SDH (subdural hematoma) (HCC)    subacute right SDH 08/23/20 CTA head   Secondary hyperparathyroidism of renal origin [facility])    Sleep apnea 07/29/2018   uses cpap nightly   Vitamin D  deficiency 11/2018   PSH: Past Surgical History:  Procedure Laterality Date   AV FISTULA PLACEMENT Left 03/24/2020   Procedure: INSERTION OF LEFT UPPER EXTREMITY ARTERIOVENOUS (AV) GORE-TEX GRAFT;  Surgeon: Magda Debby SAILOR, MD;  Location: [facility] OR;  Service: Vascular;  Laterality: Left;  PERIPHERAL NERVE BLOCK   BASCILIC VEIN TRANSPOSITION Left 12/11/2019   Procedure: 1ST STAGE BASILIC  TRANSPOSITION OF LEFT ARM;  Surgeon: Eliza Lonni RAMAN, MD;  Location: [facility] OR;  Service: Vascular;  Laterality: Left;   BIOPSY  04/06/2021   Procedure: BIOPSY;  Surgeon: Eda Iha, MD;  Location: [facility];  Service: Gastroenterology;;   COLONOSCOPY WITH PROPOFOL  N/A 04/06/2021   Procedure: COLONOSCOPY WITH PROPOFOL ;  Surgeon: Eda Iha, MD;  Location: [facility];  Service: Gastroenterology;  Laterality: N/A;   COLONOSCOPY WITH PROPOFOL  N/A 05/29/2021   Procedure: COLONOSCOPY WITH PROPOFOL ;  Surgeon: Mansouraty, Aloha Raddle., MD;  Location: [facility];  Service: Gastroenterology;  Laterality: N/A;   COLONOSCOPY WITH PROPOFOL  N/A 06/28/2022   Procedure: COLONOSCOPY WITH PROPOFOL ;  Surgeon: Mansouraty, Aloha Raddle., MD;  Location: [facility];  Service: Gastroenterology;  Laterality: N/A;   DG AV DIALYSIS GRAFT DECLOT OR Left 05/17/2020   ENDOSCOPIC MUCOSAL RESECTION  05/29/2021   Procedure: ENDOSCOPIC MUCOSAL RESECTION;  Surgeon: Wilhelmenia Aloha Raddle., MD;  Location: [facility];  Service: Gastroenterology;;   HEMOSTASIS CLIP PLACEMENT  05/29/2021   Procedure: HEMOSTASIS CLIP PLACEMENT;  Surgeon: Wilhelmenia Aloha Raddle., MD;  Location: [facility];  Service: Gastroenterology;;   IR AV DIALY SHUNT INTRO NEEDLE/INTRACATH INITIAL W/PTA/IMG LEFT  05/01/2022   IR FLUORO GUIDE CV LINE RIGHT  12/09/2019   IR US  GUIDE VASC ACCESS LEFT  05/01/2022   IR US  GUIDE VASC ACCESS RIGHT  12/09/2019   LEFT HEART CATH AND CORONARY ANGIOGRAPHY N/A 12/09/2019  Procedure: LEFT HEART CATH AND CORONARY ANGIOGRAPHY;  Surgeon: Ladona Heinz, MD;  [facility]: [facility] CV [facility];  Service: Cardiovascular;  Laterality: N/A;   POLYPECTOMY  04/06/2021   Procedure: POLYPECTOMY;  Surgeon: Eda Iha, MD;  Location: [facility];  Service: Gastroenterology;;   POLYPECTOMY  05/29/2021   Procedure: POLYPECTOMY;  Surgeon: Wilhelmenia Aloha Raddle., MD;  Location: [facility];  Service:  Gastroenterology;;   POLYPECTOMY  06/28/2022   Procedure: POLYPECTOMY;  Surgeon: Wilhelmenia Aloha Raddle., MD;  Location: [facility];  Service: Gastroenterology;;   REVISION OF ARTERIOVENOUS GORETEX GRAFT Left 08/31/2021   Procedure: REDO LEFT UPPER ARM ARTERIOVENOUS GORETEX GRAFT;  Surgeon: Lanis Fonda BRAVO, MD;  Location: [facility] OR;  Service: Vascular;  Laterality: Left;  PERIPHERAL NERVE BLOCK   SUBMUCOSAL INJECTION  05/29/2021   Procedure: SUBMUCOSAL INJECTION;  Surgeon: Wilhelmenia Aloha Raddle., MD;  Location: [facility];  Service: Gastroenterology;;  EPI   SUBMUCOSAL LIFTING INJECTION  05/29/2021   Procedure: SUBMUCOSAL LIFTING INJECTION;  Surgeon: Wilhelmenia Aloha Raddle., MD;  Location: [facility];  Service: Gastroenterology;;  Everlift   SUBMUCOSAL TATTOO INJECTION  04/06/2021   Procedure: SUBMUCOSAL TATTOO INJECTION;  Surgeon: Eda Iha, MD;  Location: [facility];  Service: Gastroenterology;;   UPPER EXTREMITY VENOGRAPHY Bilateral 08/24/2021   Procedure: UPPER EXTREMITY VENOGRAPHY;  Surgeon: Gretta Lonni PARAS, MD;  [facility]: [facility];  Service: Cardiovascular;  Laterality: Bilateral;    Past Medical History:  Diagnosis Date   Anemia in neoplastic disease    Compression of vein    Disorder of phosphorus metabolism    End stage renal disease (HCC) 12/11/2019   Essential hypertension, malignant    Familial lipoprotein deficiency    GERD (gastroesophageal reflux disease)    Gout    History of renal dialysis    M-W-F   HLD (hyperlipidemia)    Hypertension    Intracranial aneurysm    s/p coil embolization: right middle meningeal artery 09/08/20, pericallosal artery 09/22/20, right MCA 12/22/20   Iron  deficiency anemia, unspecified    Melanoma in situ of eyelid (HCC)    Nontraumatic acute subdural hemorrhage (HCC)    Obesity, unspecified    Pneumonia    Polycystic kidney disease    Pruritus of skin    SDH (subdural hematoma) (HCC)    subacute right SDH  08/23/20 CTA head   Secondary hyperparathyroidism of renal origin [facility])    Sleep apnea 07/29/2018   uses cpap nightly   Vitamin D  deficiency 11/2018    Medications:  I have reviewed the patient's current medications.  Medications Prior to Admission  Medication Sig Dispense Refill   AURYXIA  1 GM 210 MG(Fe) tablet Take 210 mg by mouth 3 (three) times daily with meals.     cyclobenzaprine  (FLEXERIL ) 10 MG tablet Take 1 tablet (10 mg total) by mouth 2 (two) times daily as needed for muscle spasms. 20 tablet 0   eszopiclone  (LUNESTA ) 2 MG TABS tablet Take 1 tablet (2 mg total) by mouth at bedtime as needed for sleep. Take immediately before bedtime 30 tablet 2   lidocaine  (LIDODERM ) 5 % Place 1 patch onto the skin daily. Remove & Discard patch within 12 hours or as directed by MD 30 patch 0   lidocaine  (LIDODERM ) 5 % Place 1 patch onto the skin daily. Remove & Discard patch within 12 hours or as directed by MD 30 patch 0    ALLERGIES:   Allergies  Allergen Reactions   Ivp Dye [Iodinated Contrast Media] Itching  FAM HX: Family History  Problem Relation Age of Onset   Diabetes Mother    Ovarian cancer Mother    Bipolar disorder Father    Hypertension Father    Prostate cancer Father    Prostate cancer Paternal Grandfather    Polycystic kidney disease Neg Hx     Social History:   reports that he quit smoking about 10 years ago. His smoking use included cigarettes. He started smoking about 20 years ago. He has a 2 pack-year smoking history. He has never used smokeless tobacco. He reports that he does not currently use alcohol. He reports that he does not use drugs.  ROS: 12 system ROS neg except per HPI  Blood pressure 117/81, pulse 88, resp. rate 14, SpO2 99%. PHYSICAL EXAM: Gen:  well appearing  Eyes: EOMI ENT: class 3 airway, good dentition Neck: thick CV:  RRR Lungs: clear on RA  Neuro:nonfocal LUE AVG lateral to prior AVG - +pulsatile, high pitched bruit in outflow,  normal overlying skin.    No results found for this or any previous visit (from the past 48 hours).  No results found.  Assessment/Plan Michael [name] is an 48 y.o. male with ESRD on HD MWF, HTN, HL, anemia, PKD, OSA, gout who present for evaluation of AVG malfunction.   **ESRD with AVG malfunction: low flows and clearances, consented for graftogram with planned intervention if stenosis identified.  Reported mild contrast allergy but did not premedicate.  Will give solumedrol 125 and benadryl  25 IV at start of procedure.  Patient consents to proceed.   Manuelita DELENA Barters 03/26/2023, 8:31 AM

## 2023-03-26 NOTE — Op Note (Signed)
 Patient presents with decreased access flows and prolonged cannulation site bleeding from his LUE AVG.    The graft was placed in 08/2021 and is lateral to a previous failed AVG.  On exam the RUA arc AVG is hyperpulsatile.   Summary:  1) Successful angiogram of a right upper arm straight AVG   with evidence of a 70% venous anastomosis to axillary vein stenosis treated to 10% with a 8 mm Mustang FE ~16 atm.   2) The body of the graft, central veins, and inflow were widely patent. 3) This right upper arm straight graft remains amenable to future percutaneous intervention.   Description of procedure: The right upper arm was prepped and draped in the usual fashion. The right upper arm straight AVG was cannulated ([id number]) in the arterial limb of the graft in an antegrade direction with an 21g micropuncture needle and then a 6 Fr sheath was inserted by guidewire exchange technique. The angiogram revealed a patent body of the graft with a 70% venous anastomosis stenosis. The body of the graft, central veins and inflow were patent.  A guidewire was easily advanced past the outflow stenosis and parked in the central veins. I then advanced an 8 x 40mm Mustang angioplasty balloon through the antegrade sheath over the guidewire to the level of the venous anastomosis to axillary vein stenosis. Venous angioplasty was performed to 100% balloon effacement with approximately 20ATM of pressure via a hand syringe assembly.   Final arteriogram and completion venogram revealed no evidence of extravasation or dissection, more rapid access flows through the graft and 10% residual stenosis in the venous anastomosis.  Hemostasis: A 3-0 ethilon purse string suture was placed at the cannulation site on removal of the sheath.  Sedation: 1mg  Versed , 25mcg Fentanyl   Contrast. 12 mL  Monitoring: Because of the patient's comorbid conditions and sedation during the procedure, continuous EKG monitoring and O2 saturation monitoring  was performed throughout the procedure by the RN. There were no abnormal arrhythmias encountered.  Complications: None.   Diagnoses: I87.1 Stricture of vein  N18.6 ESRD T82.858A Stricture of access  Procedure Coding:  [phone] Cannulation and angiogram of fistula, venous angioplasty (AVG VA) V0032 Contrast  Recommendations:  1. Continue to cannulate the fistula with 15G needles.  2. Refer back for problems with flows. 3. Remove the suture next treatment.   Discharge: The patient was discharged home in stable condition. The patient was given education regarding the care of the dialysis access AVF and specific instructions in case of any problems.

## 2023-03-26 NOTE — Discharge Instructions (Signed)

## 2023-03-27 ENCOUNTER — Encounter (HOSPITAL_COMMUNITY): Payer: Self-pay | Admitting: Internal Medicine

## 2023-03-27 DIAGNOSIS — N2581 Secondary hyperparathyroidism of renal origin: Secondary | ICD-10-CM | POA: Diagnosis not present

## 2023-03-27 DIAGNOSIS — Z992 Dependence on renal dialysis: Secondary | ICD-10-CM | POA: Diagnosis not present

## 2023-03-27 DIAGNOSIS — D689 Coagulation defect, unspecified: Secondary | ICD-10-CM | POA: Diagnosis not present

## 2023-03-27 DIAGNOSIS — N186 End stage renal disease: Secondary | ICD-10-CM | POA: Diagnosis not present

## 2023-03-29 DIAGNOSIS — D689 Coagulation defect, unspecified: Secondary | ICD-10-CM | POA: Diagnosis not present

## 2023-03-29 DIAGNOSIS — Z992 Dependence on renal dialysis: Secondary | ICD-10-CM | POA: Diagnosis not present

## 2023-03-29 DIAGNOSIS — N2581 Secondary hyperparathyroidism of renal origin: Secondary | ICD-10-CM | POA: Diagnosis not present

## 2023-03-29 DIAGNOSIS — N186 End stage renal disease: Secondary | ICD-10-CM | POA: Diagnosis not present

## 2023-04-01 DIAGNOSIS — Z992 Dependence on renal dialysis: Secondary | ICD-10-CM | POA: Diagnosis not present

## 2023-04-01 DIAGNOSIS — D689 Coagulation defect, unspecified: Secondary | ICD-10-CM | POA: Diagnosis not present

## 2023-04-01 DIAGNOSIS — N2581 Secondary hyperparathyroidism of renal origin: Secondary | ICD-10-CM | POA: Diagnosis not present

## 2023-04-01 DIAGNOSIS — N186 End stage renal disease: Secondary | ICD-10-CM | POA: Diagnosis not present

## 2023-04-03 DIAGNOSIS — N2581 Secondary hyperparathyroidism of renal origin: Secondary | ICD-10-CM | POA: Diagnosis not present

## 2023-04-03 DIAGNOSIS — D689 Coagulation defect, unspecified: Secondary | ICD-10-CM | POA: Diagnosis not present

## 2023-04-03 DIAGNOSIS — Z992 Dependence on renal dialysis: Secondary | ICD-10-CM | POA: Diagnosis not present

## 2023-04-03 DIAGNOSIS — N186 End stage renal disease: Secondary | ICD-10-CM | POA: Diagnosis not present

## 2023-04-05 DIAGNOSIS — Z992 Dependence on renal dialysis: Secondary | ICD-10-CM | POA: Diagnosis not present

## 2023-04-05 DIAGNOSIS — N186 End stage renal disease: Secondary | ICD-10-CM | POA: Diagnosis not present

## 2023-04-05 DIAGNOSIS — D689 Coagulation defect, unspecified: Secondary | ICD-10-CM | POA: Diagnosis not present

## 2023-04-05 DIAGNOSIS — N2581 Secondary hyperparathyroidism of renal origin: Secondary | ICD-10-CM | POA: Diagnosis not present

## 2023-04-08 DIAGNOSIS — N2581 Secondary hyperparathyroidism of renal origin: Secondary | ICD-10-CM | POA: Diagnosis not present

## 2023-04-08 DIAGNOSIS — D689 Coagulation defect, unspecified: Secondary | ICD-10-CM | POA: Diagnosis not present

## 2023-04-08 DIAGNOSIS — Z992 Dependence on renal dialysis: Secondary | ICD-10-CM | POA: Diagnosis not present

## 2023-04-08 DIAGNOSIS — N186 End stage renal disease: Secondary | ICD-10-CM | POA: Diagnosis not present

## 2023-04-10 DIAGNOSIS — N186 End stage renal disease: Secondary | ICD-10-CM | POA: Diagnosis not present

## 2023-04-10 DIAGNOSIS — Z992 Dependence on renal dialysis: Secondary | ICD-10-CM | POA: Diagnosis not present

## 2023-04-10 DIAGNOSIS — D689 Coagulation defect, unspecified: Secondary | ICD-10-CM | POA: Diagnosis not present

## 2023-04-10 DIAGNOSIS — N2581 Secondary hyperparathyroidism of renal origin: Secondary | ICD-10-CM | POA: Diagnosis not present

## 2023-04-12 DIAGNOSIS — Z992 Dependence on renal dialysis: Secondary | ICD-10-CM | POA: Diagnosis not present

## 2023-04-12 DIAGNOSIS — N186 End stage renal disease: Secondary | ICD-10-CM | POA: Diagnosis not present

## 2023-04-15 DIAGNOSIS — N186 End stage renal disease: Secondary | ICD-10-CM | POA: Diagnosis not present

## 2023-04-15 DIAGNOSIS — Z992 Dependence on renal dialysis: Secondary | ICD-10-CM | POA: Diagnosis not present

## 2023-04-17 DIAGNOSIS — N2581 Secondary hyperparathyroidism of renal origin: Secondary | ICD-10-CM | POA: Diagnosis not present

## 2023-04-17 DIAGNOSIS — Z992 Dependence on renal dialysis: Secondary | ICD-10-CM | POA: Diagnosis not present

## 2023-04-17 DIAGNOSIS — D689 Coagulation defect, unspecified: Secondary | ICD-10-CM | POA: Diagnosis not present

## 2023-04-17 DIAGNOSIS — N186 End stage renal disease: Secondary | ICD-10-CM | POA: Diagnosis not present

## 2023-04-19 DIAGNOSIS — N2581 Secondary hyperparathyroidism of renal origin: Secondary | ICD-10-CM | POA: Diagnosis not present

## 2023-04-19 DIAGNOSIS — Z992 Dependence on renal dialysis: Secondary | ICD-10-CM | POA: Diagnosis not present

## 2023-04-19 DIAGNOSIS — D689 Coagulation defect, unspecified: Secondary | ICD-10-CM | POA: Diagnosis not present

## 2023-04-19 DIAGNOSIS — N186 End stage renal disease: Secondary | ICD-10-CM | POA: Diagnosis not present

## 2023-04-22 DIAGNOSIS — N2581 Secondary hyperparathyroidism of renal origin: Secondary | ICD-10-CM | POA: Diagnosis not present

## 2023-04-22 DIAGNOSIS — Z992 Dependence on renal dialysis: Secondary | ICD-10-CM | POA: Diagnosis not present

## 2023-04-22 DIAGNOSIS — N186 End stage renal disease: Secondary | ICD-10-CM | POA: Diagnosis not present

## 2023-04-22 DIAGNOSIS — D689 Coagulation defect, unspecified: Secondary | ICD-10-CM | POA: Diagnosis not present

## 2023-04-24 DIAGNOSIS — Z992 Dependence on renal dialysis: Secondary | ICD-10-CM | POA: Diagnosis not present

## 2023-04-24 DIAGNOSIS — N2581 Secondary hyperparathyroidism of renal origin: Secondary | ICD-10-CM | POA: Diagnosis not present

## 2023-04-24 DIAGNOSIS — N186 End stage renal disease: Secondary | ICD-10-CM | POA: Diagnosis not present

## 2023-04-24 DIAGNOSIS — D689 Coagulation defect, unspecified: Secondary | ICD-10-CM | POA: Diagnosis not present

## 2023-04-26 DIAGNOSIS — N2581 Secondary hyperparathyroidism of renal origin: Secondary | ICD-10-CM | POA: Diagnosis not present

## 2023-04-26 DIAGNOSIS — D689 Coagulation defect, unspecified: Secondary | ICD-10-CM | POA: Diagnosis not present

## 2023-04-26 DIAGNOSIS — N186 End stage renal disease: Secondary | ICD-10-CM | POA: Diagnosis not present

## 2023-04-26 DIAGNOSIS — Z992 Dependence on renal dialysis: Secondary | ICD-10-CM | POA: Diagnosis not present

## 2023-04-28 IMAGING — DX DG CHEST 1V PORT
1 series · 1 of 1 positions shown · non-contrast
Comparison: 02/02/2020

CLINICAL DATA: Colonoscopy, abdominal pain

EXAM:
PORTABLE CHEST 1 VIEW

[chest ap]
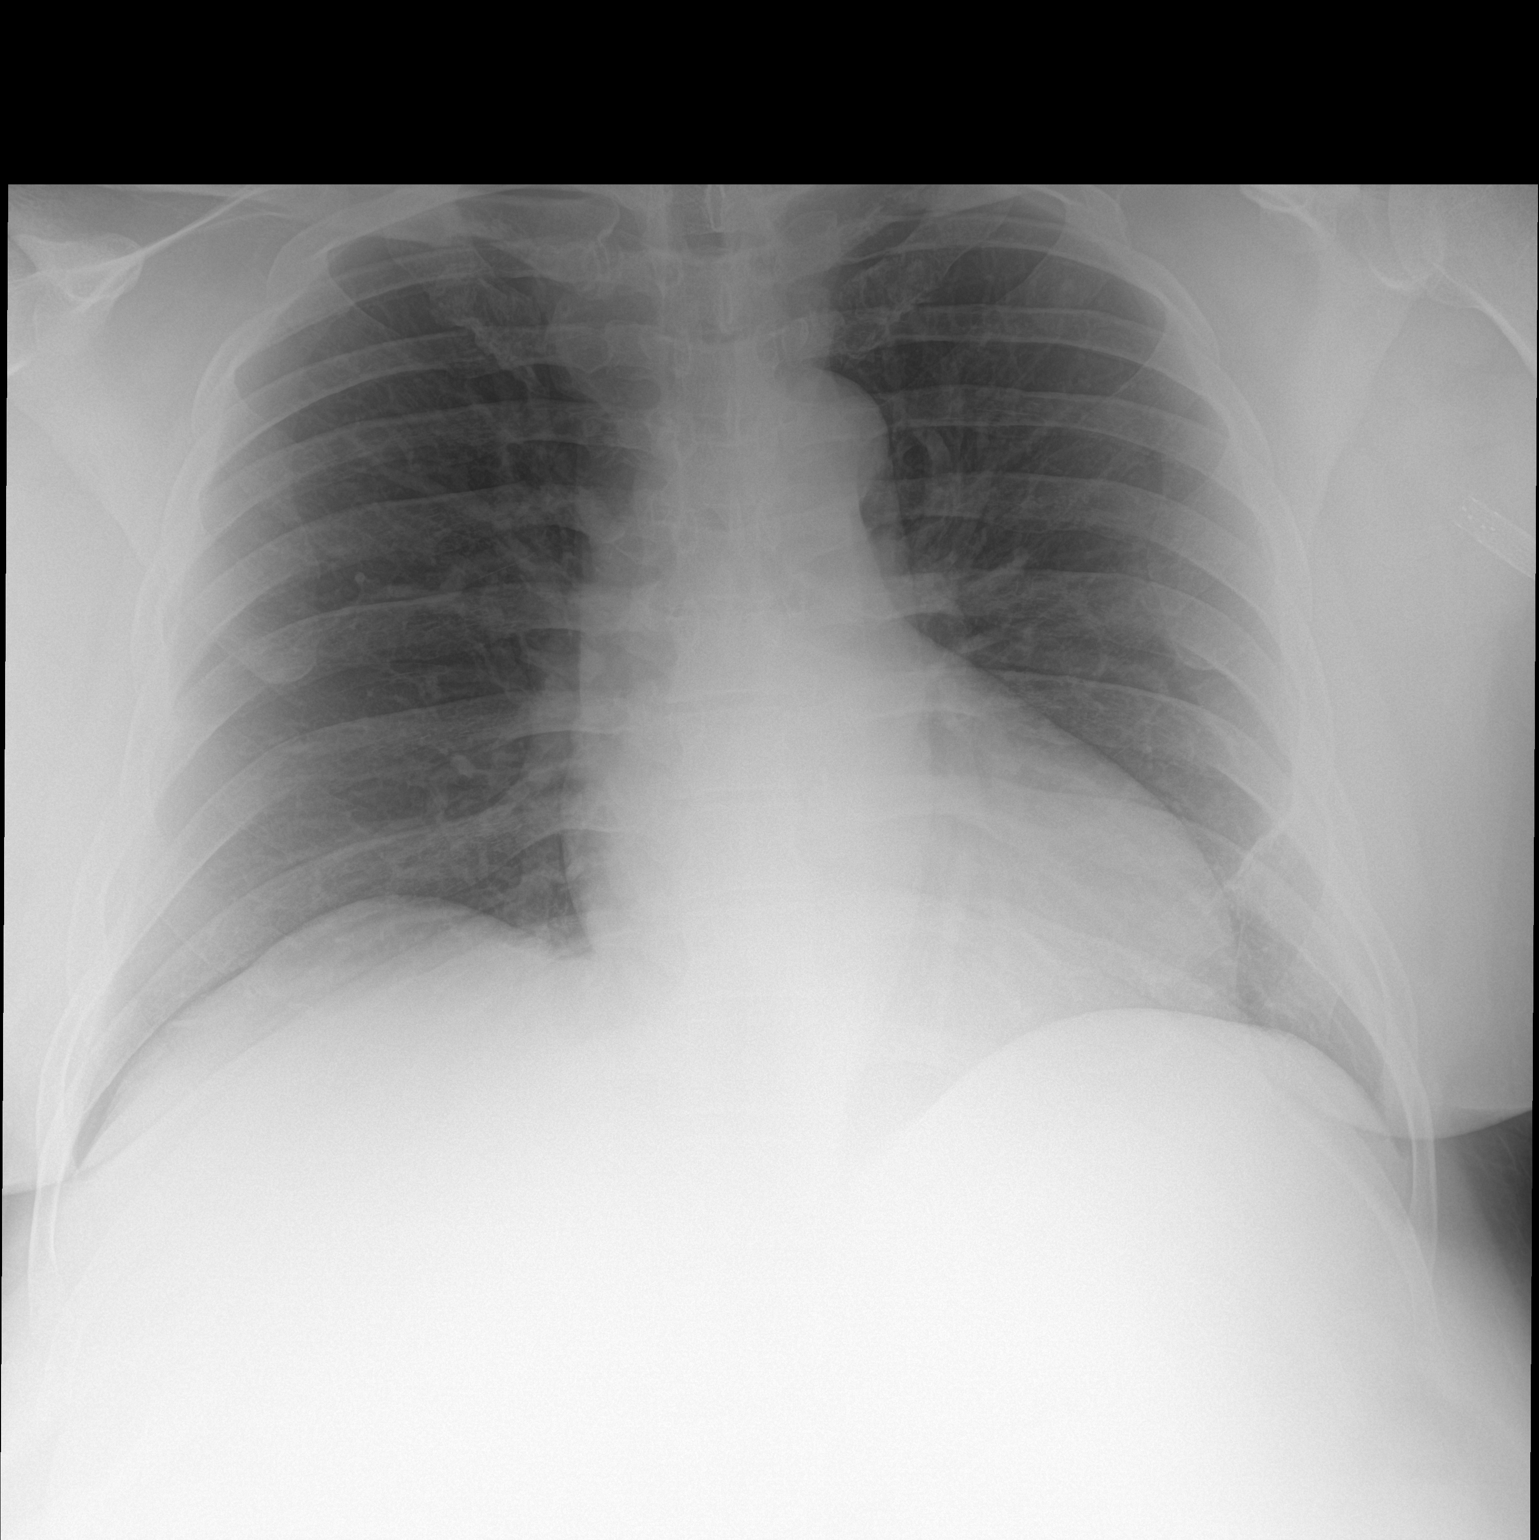

[1 of 1 positions shown; findings below may reference images not displayed]

FINDINGS: Transverse diameter of heart is increased. There are no signs of
pulmonary edema or focal pulmonary consolidation. There is no
pleural effusion or pneumothorax. There is no evidence of
pneumoperitoneum.
IMPRESSION: No active disease.

## 2023-04-28 IMAGING — DX DG ABD PORTABLE 2V
3 series · 3 of 3 positions shown · non-contrast
Comparison: None.

CLINICAL DATA: Recent colonoscopy, abdominal pain

EXAM:
PORTABLE ABDOMEN - 2 VIEW

[abdomen erect (1 of 2)]
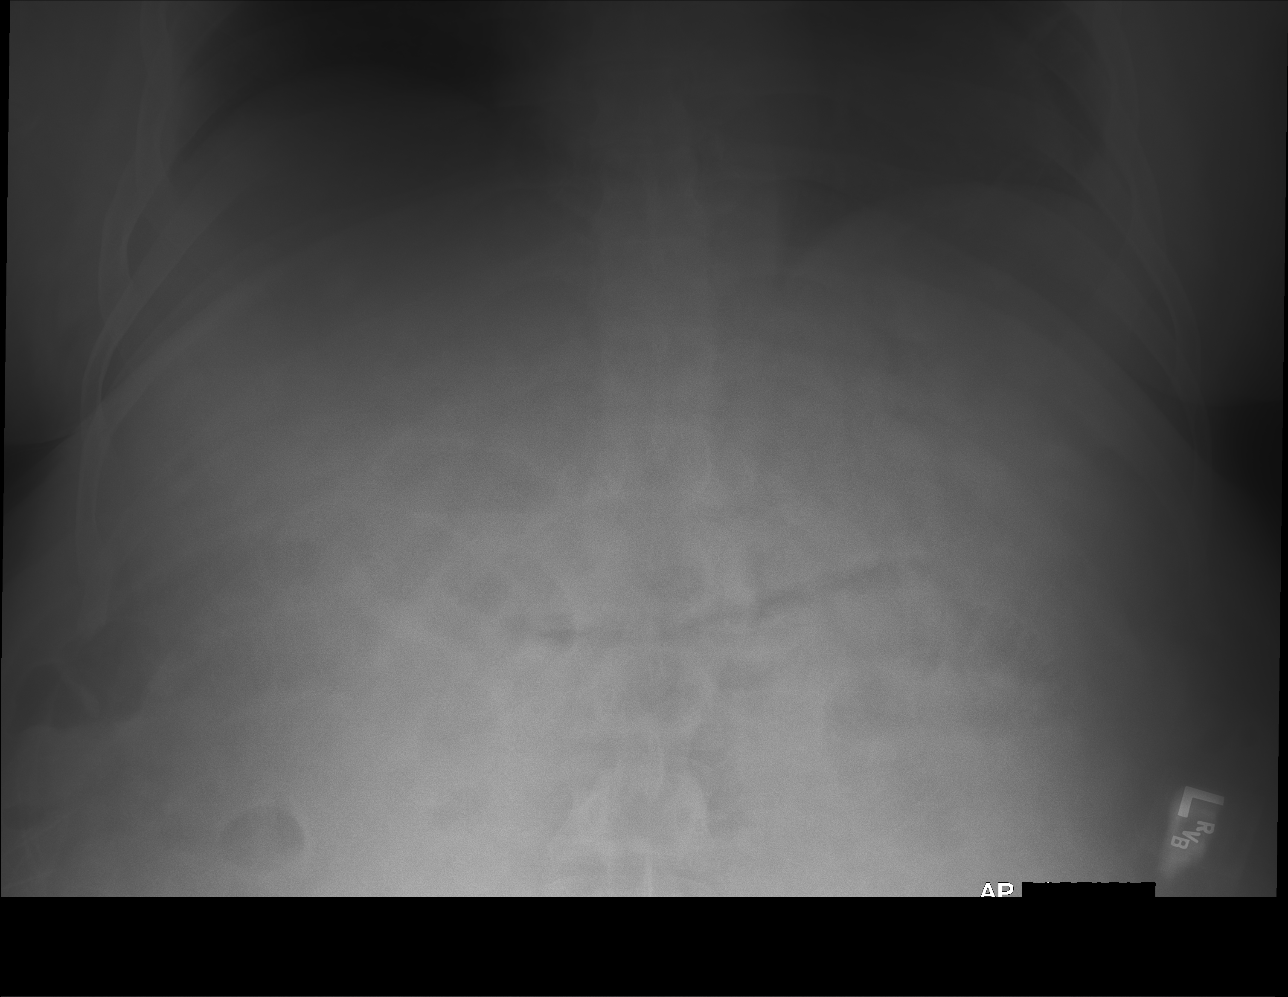

[abdomen erect (2 of 2)]
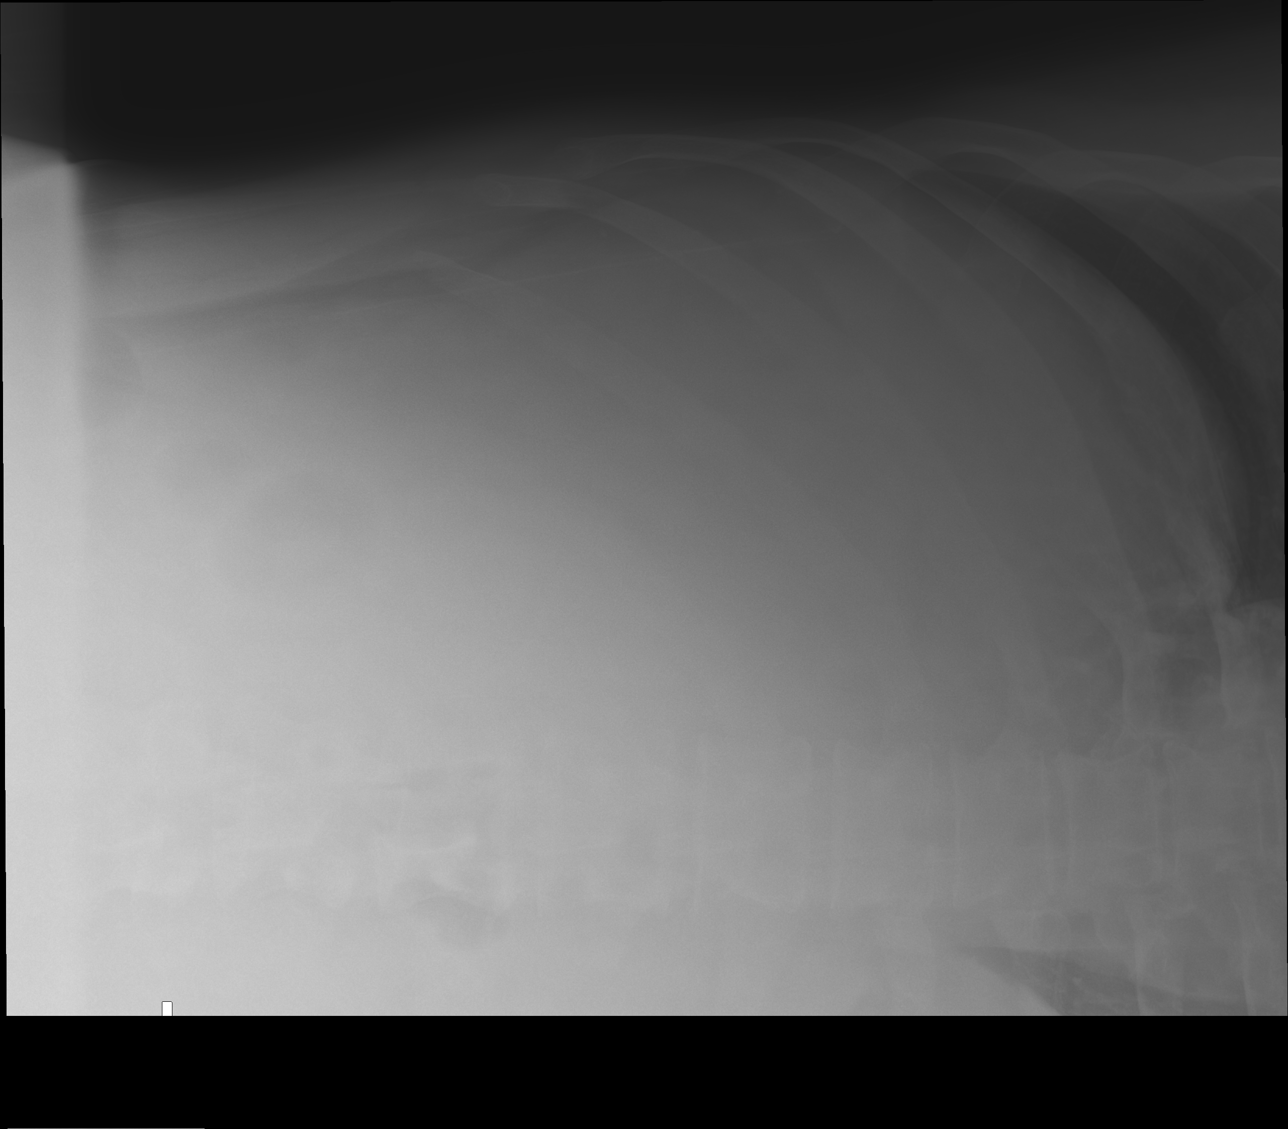

[abdomen supine]
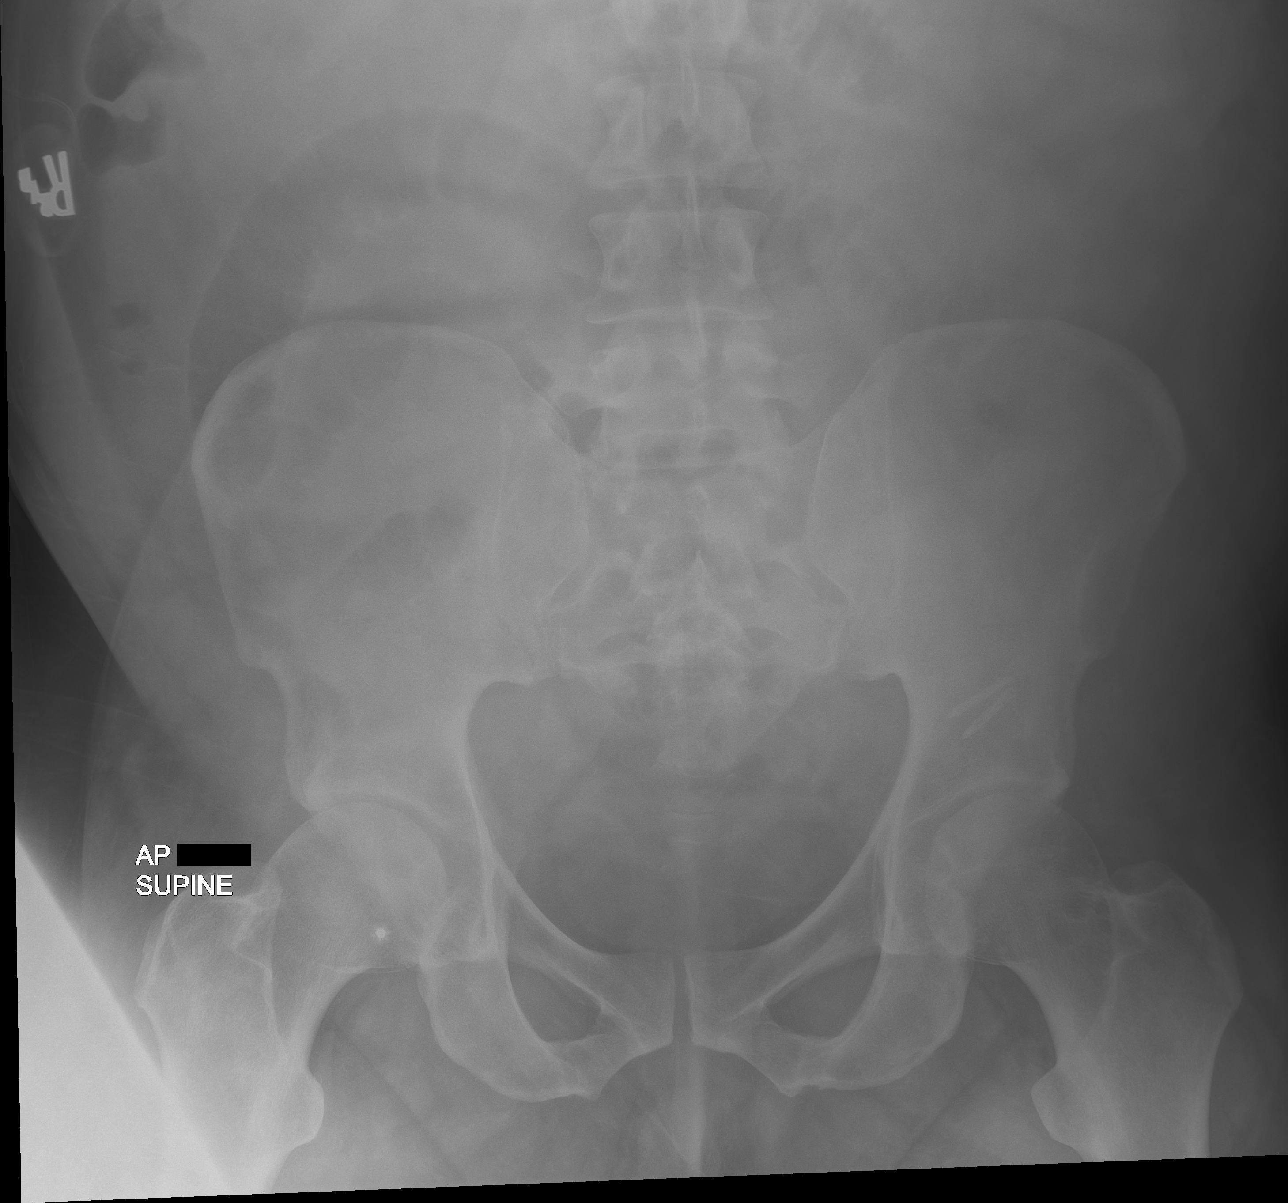

[3 of 3 positions shown; findings below may reference images not displayed]

FINDINGS: There is gas present in slightly dilated small bowel loops. There is
no evidence of pneumoperitoneum in the left lateral decubitus view.
No abnormal masses or calcifications are seen. Evaluation for small
calculus is limited by the body habitus. Kidneys are partly obscured
by bowel contents.
IMPRESSION: Presence of gas in the slightly dilated small bowel loops may
suggest mild ileus. There is no evidence of intestinal obstruction
or pneumoperitoneum.

## 2023-04-29 DIAGNOSIS — N2581 Secondary hyperparathyroidism of renal origin: Secondary | ICD-10-CM | POA: Diagnosis not present

## 2023-04-29 DIAGNOSIS — Z992 Dependence on renal dialysis: Secondary | ICD-10-CM | POA: Diagnosis not present

## 2023-04-29 DIAGNOSIS — N186 End stage renal disease: Secondary | ICD-10-CM | POA: Diagnosis not present

## 2023-04-29 DIAGNOSIS — D689 Coagulation defect, unspecified: Secondary | ICD-10-CM | POA: Diagnosis not present

## 2023-05-01 DIAGNOSIS — D689 Coagulation defect, unspecified: Secondary | ICD-10-CM | POA: Diagnosis not present

## 2023-05-01 DIAGNOSIS — N2581 Secondary hyperparathyroidism of renal origin: Secondary | ICD-10-CM | POA: Diagnosis not present

## 2023-05-01 DIAGNOSIS — Z992 Dependence on renal dialysis: Secondary | ICD-10-CM | POA: Diagnosis not present

## 2023-05-01 DIAGNOSIS — N186 End stage renal disease: Secondary | ICD-10-CM | POA: Diagnosis not present

## 2023-05-03 DIAGNOSIS — N186 End stage renal disease: Secondary | ICD-10-CM | POA: Diagnosis not present

## 2023-05-03 DIAGNOSIS — D689 Coagulation defect, unspecified: Secondary | ICD-10-CM | POA: Diagnosis not present

## 2023-05-03 DIAGNOSIS — N2581 Secondary hyperparathyroidism of renal origin: Secondary | ICD-10-CM | POA: Diagnosis not present

## 2023-05-03 DIAGNOSIS — Z992 Dependence on renal dialysis: Secondary | ICD-10-CM | POA: Diagnosis not present

## 2023-05-06 DIAGNOSIS — Z992 Dependence on renal dialysis: Secondary | ICD-10-CM | POA: Diagnosis not present

## 2023-05-06 DIAGNOSIS — N2581 Secondary hyperparathyroidism of renal origin: Secondary | ICD-10-CM | POA: Diagnosis not present

## 2023-05-06 DIAGNOSIS — N186 End stage renal disease: Secondary | ICD-10-CM | POA: Diagnosis not present

## 2023-05-06 DIAGNOSIS — D689 Coagulation defect, unspecified: Secondary | ICD-10-CM | POA: Diagnosis not present

## 2023-05-08 DIAGNOSIS — N2581 Secondary hyperparathyroidism of renal origin: Secondary | ICD-10-CM | POA: Diagnosis not present

## 2023-05-08 DIAGNOSIS — Z992 Dependence on renal dialysis: Secondary | ICD-10-CM | POA: Diagnosis not present

## 2023-05-08 DIAGNOSIS — D689 Coagulation defect, unspecified: Secondary | ICD-10-CM | POA: Diagnosis not present

## 2023-05-08 DIAGNOSIS — N186 End stage renal disease: Secondary | ICD-10-CM | POA: Diagnosis not present

## 2023-05-10 DIAGNOSIS — Z992 Dependence on renal dialysis: Secondary | ICD-10-CM | POA: Diagnosis not present

## 2023-05-10 DIAGNOSIS — D689 Coagulation defect, unspecified: Secondary | ICD-10-CM | POA: Diagnosis not present

## 2023-05-10 DIAGNOSIS — N186 End stage renal disease: Secondary | ICD-10-CM | POA: Diagnosis not present

## 2023-05-10 DIAGNOSIS — N2581 Secondary hyperparathyroidism of renal origin: Secondary | ICD-10-CM | POA: Diagnosis not present

## 2023-05-13 DIAGNOSIS — N186 End stage renal disease: Secondary | ICD-10-CM | POA: Diagnosis not present

## 2023-05-13 DIAGNOSIS — D689 Coagulation defect, unspecified: Secondary | ICD-10-CM | POA: Diagnosis not present

## 2023-05-13 DIAGNOSIS — N2581 Secondary hyperparathyroidism of renal origin: Secondary | ICD-10-CM | POA: Diagnosis not present

## 2023-05-13 DIAGNOSIS — Z992 Dependence on renal dialysis: Secondary | ICD-10-CM | POA: Diagnosis not present

## 2023-05-14 ENCOUNTER — Encounter (HOSPITAL_BASED_OUTPATIENT_CLINIC_OR_DEPARTMENT_OTHER): Payer: Self-pay

## 2023-05-14 ENCOUNTER — Ambulatory Visit (HOSPITAL_BASED_OUTPATIENT_CLINIC_OR_DEPARTMENT_OTHER): Payer: 59 | Admitting: *Deleted

## 2023-05-14 DIAGNOSIS — Z0000 Encounter for general adult medical examination without abnormal findings: Secondary | ICD-10-CM | POA: Diagnosis not present

## 2023-05-14 NOTE — Patient Instructions (Signed)
 [name] , Thank you for taking time to come for your Medicare Wellness Visit. I appreciate your ongoing commitment to your health goals. Please review the following plan we discussed and let me know if I can assist you in the future.   Screening recommendations/referrals: Colonoscopy: up to date Recommended yearly ophthalmology/optometry visit for glaucoma screening and checkup Recommended yearly dental visit for hygiene and checkup  Vaccinations: Influenza vaccine:  Pneumococcal vaccine: Education provided Tdap vaccine: Education provided     Advanced directives: Education provided     Preventive Care 40-64 Years, Male Preventive care refers to lifestyle choices and visits with your health care provider that can promote health and wellness. What does preventive care include? A yearly physical exam. This is also called an annual well check. Dental exams once or twice a year. Routine eye exams. Ask your health care provider how often you should have your eyes checked. Personal lifestyle choices, including: Daily care of your teeth and gums. Regular physical activity. Eating a healthy diet. Avoiding tobacco and drug use. Limiting alcohol use. Practicing safe sex. Taking low-dose aspirin every day starting at age 45. What happens during an annual well check? The services and screenings done by your health care provider during your annual well check will depend on your age, overall health, lifestyle risk factors, and family history of disease. Counseling  Your health care provider may ask you questions about your: Alcohol use. Tobacco use. Drug use. Emotional well-being. Home and relationship well-being. Sexual activity. Eating habits. Work and work Astron[employer]. Screening  You may have the following tests or measurements: Height, weight, and BMI. Blood pressure. Lipid and cholesterol levels. These may be checked every 5 years, or more frequently if you are over 43  years old. Skin check. Lung cancer screening. You may have this screening every year starting at age 41 if you have a 30-pack-year history of smoking and currently smoke or have quit within the past 15 years. Fecal occult blood test (FOBT) of the stool. You may have this test every year starting at age 53. Flexible sigmoidoscopy or colonoscopy. You may have a sigmoidoscopy every 5 years or a colonoscopy every 10 years starting at age 42. Prostate cancer screening. Recommendations will vary depending on your family history and other risks. Hepatitis C blood test. Hepatitis B blood test. Sexually transmitted disease (STD) testing. Diabetes screening. This is done by checking your blood sugar (glucose) after you have not eaten for a while (fasting). You may have this done every 1-3 years. Discuss your test results, treatment options, and if necessary, the need for more tests with your health care provider. Vaccines  Your health care provider may recommend certain vaccines, such as: Influenza vaccine. This is recommended every year. Tetanus, diphtheria, and acellular pertussis (Tdap, Td) vaccine. You may need a Td booster every 10 years. Zoster vaccine. You may need this after age 35. Pneumococcal 13-valent conjugate (PCV13) vaccine. You may need this if you have certain conditions and have not been vaccinated. Pneumococcal polysaccharide (PPSV23) vaccine. You may need one or two doses if you smoke cigarettes or if you have certain conditions. Talk to your health care provider about which screenings and vaccines you need and how often you need them. This information is not intended to replace advice given to you by your health care provider. Make sure you discuss any questions you have with your health care provider. Document Released: 04/01/2015 Document Revised: 11/23/2015 Document Reviewed: 01/04/2015 Elsevier Interactive Patient Education  2017  [employer].  Fall Prevention in the  Home Falls can cause injuries. They can happen to people of all ages. There are many things you can do to make your home safe and to help prevent falls. What can I do on the outside of my home? Regularly fix the edges of walkways and driveways and fix any cracks. Remove anything that might make you trip as you walk through a door, such as a raised step or threshold. Trim any bushes or trees on the path to your home. Use bright outdoor lighting. Clear any walking paths of anything that might make someone trip, such as rocks or tools. Regularly check to see if handrails are loose or broken. Make sure that both sides of any steps have handrails. Any raised decks and porches should have guardrails on the edges. Have any leaves, snow, or ice cleared regularly. Use sand or salt on walking paths during winter. Clean up any spills in your garage right away. This includes oil or grease spills. What can I do in the bathroom? Use night lights. Install grab bars by the toilet and in the tub and shower. Do not use towel bars as grab bars. Use non-skid mats or decals in the tub or shower. If you need to sit down in the shower, use a plastic, non-slip stool. Keep the floor dry. Clean up any water that spills on the floor as soon as it happens. Remove soap buildup in the tub or shower regularly. Attach bath mats securely with double-sided non-slip rug tape. Do not have throw rugs and other things on the floor that can make you trip. What can I do in the bedroom? Use night lights. Make sure that you have a light by your bed that is easy to reach. Do not use any sheets or blankets that are too big for your bed. They should not hang down onto the floor. Have a firm chair that has side arms. You can use this for support while you get dressed. Do not have throw rugs and other things on the floor that can make you trip. What can I do in the kitchen? Clean up any spills right away. Avoid walking on wet  floors. Keep items that you use a lot in easy-to-reach places. If you need to reach something above you, use a strong step stool that has a grab bar. Keep electrical cords out of the way. Do not use floor polish or wax that makes floors slippery. If you must use wax, use non-skid floor wax. Do not have throw rugs and other things on the floor that can make you trip. What can I do with my stairs? Do not leave any items on the stairs. Make sure that there are handrails on both sides of the stairs and use them. Fix handrails that are broken or loose. Make sure that handrails are as long as the stairways. Check any carpeting to make sure that it is firmly attached to the stairs. Fix any carpet that is loose or worn. Avoid having throw rugs at the top or bottom of the stairs. If you do have throw rugs, attach them to the floor with carpet tape. Make sure that you have a light switch at the top of the stairs and the bottom of the stairs. If you do not have them, ask someone to add them for you. What else can I do to help prevent falls? Wear shoes that: Do not have high heels. Have rubber bottoms. Are  comfortable and fit you well. Are closed at the toe. Do not wear sandals. If you use a stepladder: Make sure that it is fully opened. Do not climb a closed stepladder. Make sure that both sides of the stepladder are locked into place. Ask someone to hold it for you, if possible. Clearly mark and make sure that you can see: Any grab bars or handrails. First and last steps. Where the edge of each step is. Use tools that help you move around (mobility aids) if they are needed. These include: Canes. Walkers. Scooters. Crutches. Turn on the lights when you go into a dark area. Replace any light bulbs as soon as they burn out. Set up your furniture so you have a clear path. Avoid moving your furniture around. If any of your floors are uneven, fix them. If there are any pets around you, be aware of  where they are. Review your medicines with your doctor. Some medicines can make you feel dizzy. This can increase your chance of falling. Ask your doctor what other things that you can do to help prevent falls. This information is not intended to replace advice given to you by your health care provider. Make sure you discuss any questions you have with your health care provider. Document Released: 12/30/2008 Document Revised: 08/11/2015 Document Reviewed: 04/09/2014 Elsevier Interactive Patient Education  2017 [employer].

## 2023-05-14 NOTE — Progress Notes (Signed)
 Subjective:   Michael [name] is a 48 y.o. male who presents for Medicare Annual/Subsequent preventive examination.  Visit Complete: Virtual I connected with  [name] on 05/14/23 by a audio enabled telemedicine application and verified that I am speaking with the correct person using two identifiers.  Patient Location: Home  Provider Location: Home Office  I discussed the limitations of evaluation and management by telemedicine. The patient expressed understanding and agreed to proceed.  Vital Signs: Because this visit was a virtual/telehealth visit, some criteria may be missing or patient reported. Any vitals not documented were not able to be obtained and vitals that have been documented are patient reported.    Cardiac Risk Factors include: advanced age (>73men, >74 women);male gender;hypertension     Objective:    Today's Vitals   05/14/23 0817  PainSc: 6    There is no height or weight on file to calculate BMI.     05/14/2023    8:39 AM 10/05/2022    7:39 PM 10/02/2022    2:50 PM 09/29/2022    8:10 PM 09/13/2022    8:43 AM 06/28/2022    6:58 AM 06/28/2022    6:56 AM  Advanced Directives  Does Patient Have a Medical Advance Directive? No No No No No No No  Would patient like information on creating a medical advance directive? No - Patient declined No - Patient declined    No - Guardian declined     Current Medications (verified) Outpatient Encounter Medications as of 05/14/2023  Medication Sig   AURYXIA 1 GM 210 MG(Fe) tablet Take 210 mg by mouth 3 (three) times daily with meals.   cyclobenzaprine (FLEXERIL) 10 MG tablet Take 1 tablet (10 mg total) by mouth 2 (two) times daily as needed for muscle spasms.   eszopiclone (LUNESTA) 2 MG TABS tablet Take 1 tablet (2 mg total) by mouth at bedtime as needed for sleep. Take immediately before bedtime   lidocaine (LIDODERM) 5 % Place 1 patch onto the skin daily. Remove & Discard patch within 12 hours or as directed by MD    lidocaine (LIDODERM) 5 % Place 1 patch onto the skin daily. Remove & Discard patch within 12 hours or as directed by MD   [DISCONTINUED] isosorbide dinitrate (ISORDIL) 10 MG tablet Take 1 tablet (10 mg total) by mouth 2 (two) times daily. (Patient not taking: No sig reported)   No facility-administered encounter medications on file as of 05/14/2023.    Allergies (verified) Ivp dye [iodinated contrast media]   History: Past Medical History:  Diagnosis Date   Anemia in neoplastic disease    Compression of vein    Disorder of phosphorus metabolism    End stage renal disease (HCC) 12/11/2019   Essential hypertension, malignant    Familial lipoprotein deficiency    GERD (gastroesophageal reflux disease)    Gout    History of renal dialysis    M-W-F   HLD (hyperlipidemia)    Hypertension    Intracranial aneurysm    s/p coil embolization: right middle meningeal artery 09/08/20, pericallosal artery 09/22/20, right MCA 12/22/20   Iron deficiency anemia, unspecified    Melanoma in situ of eyelid (HCC)    Nontraumatic acute subdural hemorrhage (HCC)    Obesity, unspecified    Pneumonia    Polycystic kidney disease    Pruritus of skin    SDH (subdural hematoma) (HCC)    subacute right SDH 08/23/20 CTA head   Secondary hyperparathyroidism of renal origin (HCC)  Sleep apnea 07/29/2018   uses cpap nightly   Vitamin D deficiency 11/2018   Past Surgical History:  Procedure Laterality Date   A/V FISTULAGRAM N/A 03/26/2023   Procedure: A/V Fistulagram;  Surgeon: Tyler Pita, MD;  Location: [facility];  Service: Cardiovascular;  Laterality: N/A;   AV FISTULA PLACEMENT Left 03/24/2020   Procedure: INSERTION OF LEFT UPPER EXTREMITY ARTERIOVENOUS (AV) GORE-TEX GRAFT;  Surgeon: Leonie Douglas, MD;  Location: [facility] OR;  Service: Vascular;  Laterality: Left;  PERIPHERAL NERVE BLOCK   BASCILIC VEIN TRANSPOSITION Left 12/11/2019   Procedure: 1ST STAGE BASILIC TRANSPOSITION OF LEFT ARM;   Surgeon: Chuck Hint, MD;  Location: [facility];  Service: Vascular;  Laterality: Left;   BIOPSY  04/06/2021   Procedure: BIOPSY;  Surgeon: Tressia Danas, MD;  Location: [facility];  Service: Gastroenterology;;   COLONOSCOPY WITH PROPOFOL N/A 04/06/2021   Procedure: COLONOSCOPY WITH PROPOFOL;  Surgeon: Tressia Danas, MD;  Location: [facility];  Service: Gastroenterology;  Laterality: N/A;   COLONOSCOPY WITH PROPOFOL N/A 05/29/2021   Procedure: COLONOSCOPY WITH PROPOFOL;  Surgeon: Meridee Score Netty Starring., MD;  Location: [facility];  Service: Gastroenterology;  Laterality: N/A;   COLONOSCOPY WITH PROPOFOL N/A 06/28/2022   Procedure: COLONOSCOPY WITH PROPOFOL;  Surgeon: Meridee Score Netty Starring., MD;  Location: [facility];  Service: Gastroenterology;  Laterality: N/A;   DG AV DIALYSIS GRAFT DECLOT OR Left 05/17/2020   ENDOSCOPIC MUCOSAL RESECTION  05/29/2021   Procedure: ENDOSCOPIC MUCOSAL RESECTION;  Surgeon: Meridee Score Netty Starring., MD;  Location: [facility];  Service: Gastroenterology;;   HEMOSTASIS CLIP PLACEMENT  05/29/2021   Procedure: HEMOSTASIS CLIP PLACEMENT;  Surgeon: Lemar Lofty., MD;  Location: [facility];  Service: Gastroenterology;;   IR AV DIALY SHUNT INTRO NEEDLE/INTRACATH INITIAL W/PTA/IMG LEFT  05/01/2022   IR FLUORO GUIDE CV LINE RIGHT  12/09/2019   IR US GUIDE VASC ACCESS LEFT  05/01/2022   IR US GUIDE VASC ACCESS RIGHT  12/09/2019   LEFT HEART CATH AND CORONARY ANGIOGRAPHY N/A 12/09/2019   Procedure: LEFT HEART CATH AND CORONARY ANGIOGRAPHY;  Surgeon: Yates Decamp, MD;  [facility];  Service: Cardiovascular;  Laterality: N/A;   PERIPHERAL VASCULAR BALLOON ANGIOPLASTY Left 03/26/2023   Procedure: PERIPHERAL VASCULAR BALLOON ANGIOPLASTY;  Surgeon: Tyler Pita, MD;  [facility]: [facility];  Service: Cardiovascular;  Laterality: Left;  left AVG 60% VA   POLYPECTOMY  04/06/2021   Procedure: POLYPECTOMY;  Surgeon: Tressia Danas, MD;  Location: [facility];  Service: Gastroenterology;;   POLYPECTOMY  05/29/2021   Procedure: POLYPECTOMY;  Surgeon: Lemar Lofty., MD;  Location: [facility];  Service: Gastroenterology;;   POLYPECTOMY  06/28/2022   Procedure: POLYPECTOMY;  Surgeon: Lemar Lofty., MD;  Location: [facility];  Service: Gastroenterology;;   REVISION OF ARTERIOVENOUS GORETEX GRAFT Left 08/31/2021   Procedure: REDO LEFT UPPER ARM ARTERIOVENOUS GORETEX GRAFT;  Surgeon: Victorino Sparrow, MD;  Location: [facility] OR;  Service: Vascular;  Laterality: Left;  PERIPHERAL NERVE BLOCK   SUBMUCOSAL INJECTION  05/29/2021   Procedure: SUBMUCOSAL INJECTION;  Surgeon: Lemar Lofty., MD;  Location: [facility];  Service: Gastroenterology;;  EPI   SUBMUCOSAL LIFTING INJECTION  05/29/2021   Procedure: SUBMUCOSAL LIFTING INJECTION;  Surgeon: Lemar Lofty., MD;  Location: [facility];  Service: Gastroenterology;;  Everlift   SUBMUCOSAL TATTOO INJECTION  04/06/2021   Procedure: SUBMUCOSAL TATTOO INJECTION;  Surgeon: Tressia Danas, MD;  Location: [facility];  Service: Gastroenterology;;   UPPER EXTREMITY VENOGRAPHY  Bilateral 08/24/2021   Procedure: UPPER EXTREMITY VENOGRAPHY;  Surgeon: Cephus Shelling, MD;  Location: [facility] INVASIVE CV LAB;  Service: Cardiovascular;  Laterality: Bilateral;   Family History  Problem Relation Age of Onset   Diabetes Mother    Ovarian cancer Mother    Bipolar disorder Father    Hypertension Father    Prostate cancer Father    Prostate cancer Paternal Grandfather    Polycystic kidney disease Neg Hx    Social History   Socioeconomic History   Marital status: Single    Spouse name: Not on file   Number of children: 1   Years of education: Not on file   Highest education level: Not on file  Occupational History   Occupation: [employer]   Occupation: disabled  Tobacco Use   Smoking status: Former    Current packs/day: 0.00    Average  packs/day: 0.2 packs/day for 10.0 years (2.0 ttl pk-yrs)    Types: Cigarettes    Start date: 2005    Quit date: 2015    Years since quitting: 10.1   Smokeless tobacco: Never  Vaping Use   Vaping status: Never Used  Substance and Sexual Activity   Alcohol use: Not Currently    Comment: occasional wine   Drug use: No   Sexual activity: Yes  Other Topics Concern   Not on file  Social History Narrative   Not on file   Social Drivers of Health   Financial Resource Strain: Medium Risk (05/14/2023)   Overall Financial Resource Strain (CARDIA)    Difficulty of Paying Living Expenses: Somewhat hard  Food Insecurity: Food Insecurity Present (05/14/2023)   Hunger Vital Sign    Worried About Running Out of Food in the Last Year: Often true    Ran Out of Food in the Last Year: Sometimes true  Transportation Needs: No Transportation Needs (05/14/2023)   PRAPARE - Administrator, Civil Service (Medical): No    Lack of Transportation (Non-Medical): No  Physical Activity: Insufficiently Active (05/14/2023)   Exercise Vital Sign    Days of Exercise per Week: 2 days    Minutes of Exercise per Session: 60 min  Stress: No Stress Concern Present (05/14/2023)   [employer] of Occupational Health - Occupational Stress Questionnaire    Feeling of Stress : Only a little  Social Connections: Moderately Isolated (05/14/2023)   Social Connection and Isolation Panel [NHANES]    Frequency of Communication with Friends and Family: More than three times a week    Frequency of Social Gatherings with Friends and Family: More than three times a week    Attends Religious Services: More than 4 times per year    Active Member of [employer] or Organizations: No    Attends Engineer, structural: Never    Marital Status: Never married    Tobacco Counseling Counseling given: Not Answered   Clinical Intake:  Pre-visit preparation completed: Yes  Pain : 0-10 Pain Score: 6  Pain Type: Acute  pain Pain Location: Back Pain Descriptors / Indicators: Burning, Aching, Dull Pain Onset: In the past 7 days     Diabetes: No  How often do you need to have someone help you when you read instructions, pamphlets, or other written materials from your doctor or pharmacy?: 1 - Never  Interpreter Needed?: No  Information entered by :: Remi Haggard LPN   Activities of Daily Living    05/14/2023    8:29 AM 03/26/2023  7:23 AM  In your present state of health, do you have any difficulty performing the following activities:  Hearing? 0 0  Vision? 0 0  Difficulty concentrating or making decisions? 0 0  Walking or climbing stairs? 0   Dressing or bathing? 0   Doing errands, shopping? 0   Preparing Food and eating ? N   Using the Toilet? N   In the past six months, have you accidently leaked urine? N   Do you have problems with loss of bowel control? N   Managing your Medications? N   Managing your Finances? N   Housekeeping or managing your Housekeeping? N     Patient Care Team: Alyson Reedy, FNP as PCP - General (Family Medicine) Center, [facility]  Indicate any recent Medical Services you may have received from other than [facility] providers in the past year (date may be approximate).     Assessment:   This is a routine wellness examination for Bard.  Hearing/Vision screen Hearing Screening - Comments:: No trouble hearing Vision Screening - Comments:: Up to date Unsure of name [address]   Goals Addressed             This Visit's Progress    DIET - REDUCE FAT INTAKE   On track    Patient Stated       Get his kidney         Depression Screen    05/14/2023    8:22 AM 10/09/2022    8:28 AM 07/10/2022    9:14 AM 05/15/2022    9:57 AM 04/05/2022   10:24 AM 07/28/2021    8:20 AM 08/30/2020    1:32 PM  PHQ 2/9 Scores  PHQ - 2 Score 0 1 2 0 2 0 0  PHQ- 9 Score 3 4 5  0 7 3   Exception Documentation  Medical reason   Medical reason      Fall  Risk    05/14/2023    8:18 AM 10/09/2022    8:28 AM 05/15/2022    9:59 AM 04/05/2022   10:25 AM 07/28/2021    8:20 AM  Fall Risk   Falls in the past year? 0 0 0 0 0  Number falls in past yr: 0 0 0 0 0  Injury with Fall? 0 0 0 0 0  Risk for fall due to :  No Fall Risks No Fall Risks No Fall Risks No Fall Risks  Follow up Falls evaluation completed;Education provided;Falls prevention discussed Falls evaluation completed Falls prevention discussed Falls evaluation completed Falls evaluation completed    MEDICARE RISK AT HOME: Medicare Risk at Home Any stairs in or around the home?: Yes If so, are there any without handrails?: No Home free of loose throw rugs in walkways, pet beds, electrical cords, etc?: Yes Adequate lighting in your home to reduce risk of falls?: Yes Life alert?: No Use of a cane, walker or w/c?: No Grab bars in the bathroom?: No Shower chair or bench in shower?: No Elevated toilet seat or a handicapped toilet?: No  TIMED UP AND GO:  Was the test performed?  No    Cognitive Function:        05/14/2023    8:21 AM 05/15/2022   10:01 AM  6CIT Screen  What Year? 0 points 0 points  What month? 0 points 0 points  What time? 0 points 0 points  Count back from 20 0 points 0 points  Months in reverse  0 points 0 points  Repeat phrase 0 points 0 points  Total Score 0 points 0 points    Immunizations Immunization History  Administered Date(s) Administered   Hepatitis A, Adult 08/23/2020   Influenza Inj Mdck Quad With Preservative 01/28/2018   Influenza Split 01/30/2017, 01/28/2018   Influenza,inj,Quad PF,6+ Mos 01/30/2017   Moderna Sars-Covid-2 Vaccination 08/18/2019, 09/16/2019   Pneumococcal Conjugate-13 08/23/2020    TDAP status: Due, Education has been provided regarding the importance of this vaccine. Advised may receive this vaccine at local pharmacy or Health Dept. Aware to provide a copy of the vaccination record if obtained from local pharmacy or  Health Dept. Verbalized acceptance and understanding.  Flu Vaccine status: Due, Education has been provided regarding the importance of this vaccine. Advised may receive this vaccine at local pharmacy or Health Dept. Aware to provide a copy of the vaccination record if obtained from local pharmacy or Health Dept. Verbalized acceptance and understanding.  Pneumococcal vaccine status: Due, Education has been provided regarding the importance of this vaccine. Advised may receive this vaccine at local pharmacy or Health Dept. Aware to provide a copy of the vaccination record if obtained from local pharmacy or Health Dept. Verbalized acceptance and understanding.  Covid-19 vaccine status: Information provided on how to obtain vaccines.   Qualifies for Shingles Vaccine? No   Zostavax completed No     Screening Tests Health Maintenance  Topic Date Due   DTaP/Tdap/Td (1 - Tdap) Never done   Pneumococcal Vaccine 28-32 Years old (2 of 2 - PPSV23 or PCV20) 10/18/2020   COVID-19 Vaccine (3 - Moderna risk series) 03/18/2024 (Originally 10/14/2019)   Medicare Annual Wellness (AWV)  05/13/2024   Colonoscopy  06/27/2025   Hepatitis C Screening  Completed   HIV Screening  Completed   HPV VACCINES  Aged Out    Health Maintenance  Health Maintenance Due  Topic Date Due   DTaP/Tdap/Td (1 - Tdap) Never done   Pneumococcal Vaccine 31-86 Years old (2 of 2 - PPSV23 or PCV20) 10/18/2020    Colorectal cancer screening: Type of screening: Colonoscopy. Completed 2024. Repeat every 3 years  Lung Cancer Screening: (Low Dose CT Chest recommended if Age 41-80 years, 20 pack-year currently smoking OR have quit w/in 15years.) does not qualify.   Lung Cancer Screening Referral:   Additional Screening:  Hepatitis C Screening: does not qualify; Completed 2011  Vision Screening: Recommended annual ophthalmology exams for early detection of glaucoma and other disorders of the eye. Is the patient up to date with  their annual eye exam?  Yes  Who is the provider or what is the name of the office in which the patient attends annual eye exams? [facility] NAME  ON [address] If pt is not established with a provider, would they like to be referred to a provider to establish care? No .   Dental Screening: Recommended annual dental exams for proper oral hygiene    Community Resource Referral / Chronic Care Management: CRR required this visit?  No   CCM required this visit?  No     Plan:     I have personally reviewed and noted the following in the patient's chart:   Medical and social history Use of alcohol, tobacco or illicit drugs  Current medications and supplements including opioid prescriptions. Patient is not currently taking opioid prescriptions. Functional ability and status Nutritional status Physical activity Advanced directives List of other physicians Hospitalizations, surgeries, and ER visits in previous 12 months Vitals  Screenings to include cognitive, depression, and falls Referrals and appointments  In addition, I have reviewed and discussed with patient certain preventive protocols, quality metrics, and best practice recommendations. A written personalized care plan for preventive services as well as general preventive health recommendations were provided to patient.     Remi Haggard, LPN   0/98/1191   After Visit Summary: (MyChart) Due to this being a telephonic visit, the after visit summary with patients personalized plan was offered to patient via MyChart   Nurse Notes:

## 2023-05-15 DIAGNOSIS — Z992 Dependence on renal dialysis: Secondary | ICD-10-CM | POA: Diagnosis not present

## 2023-05-15 DIAGNOSIS — N186 End stage renal disease: Secondary | ICD-10-CM | POA: Diagnosis not present

## 2023-05-15 DIAGNOSIS — D689 Coagulation defect, unspecified: Secondary | ICD-10-CM | POA: Diagnosis not present

## 2023-05-15 DIAGNOSIS — N2581 Secondary hyperparathyroidism of renal origin: Secondary | ICD-10-CM | POA: Diagnosis not present

## 2023-05-16 ENCOUNTER — Telehealth: Payer: Self-pay | Admitting: *Deleted

## 2023-05-16 NOTE — Progress Notes (Signed)
 Complex Care Management Note  Care Guide Note 05/16/2023 Name: Michael [name] MRN: [id number] DOB: Dec 19, 1975  Michael [name] is a 48 y.o. year old male who sees Alyson Reedy, FNP for primary care. I reached out to [facility] by phone today to offer complex care management services.  Mr. Shader was given information about Complex Care Management services today including:   The Complex Care Management services include support from the care team which includes your Nurse Care Manager, Clinical Social Worker, or Pharmacist.  The Complex Care Management team is here to help remove barriers to the health concerns and goals most important to you. Complex Care Management services are voluntary, and the patient may decline or stop services at any time by request to their care team member.   Complex Care Management Consent Status: Patient agreed to services and verbal consent obtained.   Follow up plan:  Telephone appointment with complex care management team member scheduled for:  05/21/23  Encounter Outcome:  Patient Scheduled  Gwenevere Ghazi  [facility]  [facility], [facility] Guide  Direct Dial: [phone]  Fax [phone]

## 2023-05-17 DIAGNOSIS — Z992 Dependence on renal dialysis: Secondary | ICD-10-CM | POA: Diagnosis not present

## 2023-05-17 DIAGNOSIS — N186 End stage renal disease: Secondary | ICD-10-CM | POA: Diagnosis not present

## 2023-05-17 DIAGNOSIS — D689 Coagulation defect, unspecified: Secondary | ICD-10-CM | POA: Diagnosis not present

## 2023-05-17 DIAGNOSIS — N2581 Secondary hyperparathyroidism of renal origin: Secondary | ICD-10-CM | POA: Diagnosis not present

## 2023-05-20 DIAGNOSIS — D689 Coagulation defect, unspecified: Secondary | ICD-10-CM | POA: Diagnosis not present

## 2023-05-20 DIAGNOSIS — Z992 Dependence on renal dialysis: Secondary | ICD-10-CM | POA: Diagnosis not present

## 2023-05-20 DIAGNOSIS — N2581 Secondary hyperparathyroidism of renal origin: Secondary | ICD-10-CM | POA: Diagnosis not present

## 2023-05-20 DIAGNOSIS — N186 End stage renal disease: Secondary | ICD-10-CM | POA: Diagnosis not present

## 2023-05-21 ENCOUNTER — Encounter (HOSPITAL_BASED_OUTPATIENT_CLINIC_OR_DEPARTMENT_OTHER): Payer: 59

## 2023-05-21 ENCOUNTER — Ambulatory Visit: Payer: Self-pay | Admitting: Licensed Clinical Social Worker

## 2023-05-21 NOTE — Patient Instructions (Signed)
 Visit Information  Thank you for taking time to visit with me today. Please don't hesitate to contact me if I can be of assistance to you.   Following are the goals we discussed today:   Goals Addressed             This Visit's Progress    patient has financial stressors. Patient goes to dialysis 3 times weekly       Interventions: Spoke with client via phone today about client needs. Discussed program support with RN, LCSW, Pharmacist Discussed medications procurement Client has [facility] and Medicaid.  Client has U Card he uses as needed Client does have an aunt who lives in area and can provide some support Discussed transport needs. Client has a car he drives as needed to medical appointments and to dialysis. Discussed food needs Client has some food scarcity issues.  Client said he [facility] with [facility]. Regarding food pantry support,  Daisy said pantries may have processed foods and he cannot eat processed foods . Discussed energy level of client. He said he is fatigued after dialysis treatments. Each treatment takes 4 hours and 15 minutes. Discussed sleeping issues. He has C-PAP machine but wants to talk with NP about C-PAP needs Discussed family support. He has an aunt who lives in the area He resides in apartment; but, he claims that the rent is high and it is hard to pay rent and other monthly bills. Financial stressors are present.  He said he has started to receive bills from [facility] or D[facility].  He has bill with [facility] Client said he has met with representative of DSS in [place], [place] about his current financial needs and status.  He has financial strain Discussed mood of client.  He said he feels sad occasionally. He likes to watch sports. He has mountain bike. He has remote control cars to use as hobby.  He said he has a friend in [place] he speak with via phone Client said SW at [employer] had given him information on local food  pantries. 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 He has challenges with financial needs.    Used Active Listening techniques to hear needs of client Provided counseling support for client Thanked client for phone call with LCSW today Encouraged client to call LCSW as needed for SW support for client at [phone] Client was appreciative of call from LCSW today          Our next appointment is by telephone on 06/11/23 at 2:30 PM   Please call the care guide team at [phone] if you need to cancel or reschedule your appointment.   If you are experiencing a Mental Health or Behavioral Health Crisis or need someone to talk to, please go to [facility] [address], [place] [phone])   The patient verbalized understanding of instructions, educational materials, and care plan provided today and DECLINED offer to receive copy of patient instructions, educational materials, and care plan.   The patient has been provided with contact information for the care management team and has been advised to call with any health related questions or concerns.    Lorna Few  MSW, LCSW [facility] Licensed Clinical Social Worker Direct Dial:  [phone] Fax:  [phone] Website:  [email or web address]

## 2023-05-21 NOTE — Patient Outreach (Signed)
 Care Coordination   Initial Visit Note   05/21/2023 Name: Michael [name] MRN: [id number] DOB: 1975/09/22  Michael [name] is a 48 y.o. year old male who sees Alyson Reedy, FNP for primary care. I spoke with  [name] by phone today.  What matters to the patients health and wellness today?  patient has financial stressors. Patient goes to dialysis 3 times weekly     Goals Addressed             This Visit's Progress    patient has financial stressors. Patient goes to dialysis 3 times weekly       Interventions: Spoke with client via phone today about client needs. Discussed program support with RN, LCSW, Pharmacist Discussed medications procurement Client has [facility] and Medicaid.  Client has U Card he uses as needed Client does have an aunt who lives in area and can provide some support Discussed transport needs. Client has a car he drives as needed to medical appointments and to dialysis. Discussed food needs Client has some food scarcity issues.  Client said he [facility]. Regarding food pantry support,  Jomar said pantries may have processed foods and he cannot eat processed foods . Discussed energy level of client. He said he is fatigued after dialysis treatments. Each treatment takes 4 hours and 15 minutes. Discussed sleeping issues. He has C-PAP machine but wants to talk with NP about C-PAP needs Discussed family support. He has an aunt who lives in the area He resides in apartment; but, he claims that the rent is high and it is hard to pay rent and other monthly bills. Financial stressors are present.  He said he has started to receive bills from [facility] or [facility].  He has bill with [facility] Client said he has met with representative of DSS in [place], [place] about his current financial needs and status.  He has financial strain Discussed mood of client.  He said he feels sad occasionally. He likes to watch sports. He has mountain  bike. He has remote control cars to use as hobby.  He said he has a friend in [place] he speak with via phone Client said SW at [employer] had given him information on local food pantries. 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 He has challenges with financial needs.    Used Active Listening techniques to hear needs of client Provided counseling support for client Thanked client for phone call with LCSW today Encouraged client to call LCSW as needed for SW support for client at [phone] Client was appreciative of call from LCSW today          SDOH assessments and interventions completed:  Yes  SDOH Interventions Today    Flowsheet Row Most Recent Value  SDOH Interventions   Financial Strain Interventions Other (Comment)  [has financial strain]  Physical Activity Interventions Other (Comments)  [some mobility challenges]  Stress Interventions Provide Counseling  [has financial stress,  has stress in managing medical needs]        Care Coordination Interventions:  Yes, provided    Interventions Today    Flowsheet Row Most Recent Value  Chronic Disease   Chronic disease during today's visit Other  [spoke with client about client needs]  General Interventions   General Interventions Discussed/Reviewed General Interventions Discussed, Community Resources  Education Interventions   Education Provided Provided Education  Provided Engineer, petroleum On [employer]  Mental Health Interventions   Mental Health Discussed/Reviewed Coping Strategies  [trying to cope with financial needs]  Nutrition Interventions   Nutrition Discussed/Reviewed Nutrition Discussed  Pharmacy Interventions   Pharmacy Dicussed/Reviewed Pharmacy Topics Discussed  Safety Interventions   Safety Discussed/Reviewed Fall Risk       Follow up plan: Follow up call scheduled for 06/11/23 at 2:30 PM     Encounter Outcome:  Patient Visit Completed     Lorna Few  MSW, LCSW [place] Licensed Clinical Social Worker Direct Dial:  [phone] Fax:  [phone] Website:  [email or web address]

## 2023-05-22 DIAGNOSIS — N2581 Secondary hyperparathyroidism of renal origin: Secondary | ICD-10-CM | POA: Diagnosis not present

## 2023-05-22 DIAGNOSIS — Z992 Dependence on renal dialysis: Secondary | ICD-10-CM | POA: Diagnosis not present

## 2023-05-22 DIAGNOSIS — N186 End stage renal disease: Secondary | ICD-10-CM | POA: Diagnosis not present

## 2023-05-22 DIAGNOSIS — D689 Coagulation defect, unspecified: Secondary | ICD-10-CM | POA: Diagnosis not present

## 2023-05-24 DIAGNOSIS — Z992 Dependence on renal dialysis: Secondary | ICD-10-CM | POA: Diagnosis not present

## 2023-05-24 DIAGNOSIS — N186 End stage renal disease: Secondary | ICD-10-CM | POA: Diagnosis not present

## 2023-05-24 DIAGNOSIS — N2581 Secondary hyperparathyroidism of renal origin: Secondary | ICD-10-CM | POA: Diagnosis not present

## 2023-05-24 DIAGNOSIS — D689 Coagulation defect, unspecified: Secondary | ICD-10-CM | POA: Diagnosis not present

## 2023-05-27 ENCOUNTER — Encounter (HOSPITAL_COMMUNITY): Payer: Self-pay | Admitting: Internal Medicine

## 2023-05-27 DIAGNOSIS — N2581 Secondary hyperparathyroidism of renal origin: Secondary | ICD-10-CM | POA: Diagnosis not present

## 2023-05-27 DIAGNOSIS — Z992 Dependence on renal dialysis: Secondary | ICD-10-CM | POA: Diagnosis not present

## 2023-05-27 DIAGNOSIS — D689 Coagulation defect, unspecified: Secondary | ICD-10-CM | POA: Diagnosis not present

## 2023-05-27 DIAGNOSIS — N186 End stage renal disease: Secondary | ICD-10-CM | POA: Diagnosis not present

## 2023-05-28 ENCOUNTER — Ambulatory Visit (HOSPITAL_COMMUNITY)
Admission: RE | Admit: 2023-05-28 | Discharge: 2023-05-28 | Disposition: A | Discharge status: Home / Self Care | Attending: Internal Medicine | Admitting: Internal Medicine

## 2023-05-28 ENCOUNTER — Other Ambulatory Visit: Payer: Self-pay

## 2023-05-28 ENCOUNTER — Encounter (HOSPITAL_COMMUNITY)
Admission: RE | Disposition: A | Discharge status: Home / Self Care | Payer: Self-pay | Source: Home / Self Care | Attending: Internal Medicine

## 2023-05-28 DIAGNOSIS — D631 Anemia in chronic kidney disease: Secondary | ICD-10-CM | POA: Diagnosis not present

## 2023-05-28 DIAGNOSIS — Z992 Dependence on renal dialysis: Secondary | ICD-10-CM | POA: Diagnosis not present

## 2023-05-28 DIAGNOSIS — M109 Gout, unspecified: Secondary | ICD-10-CM | POA: Insufficient documentation

## 2023-05-28 DIAGNOSIS — T82858A Stenosis of vascular prosthetic devices, implants and grafts, initial encounter: Secondary | ICD-10-CM | POA: Insufficient documentation

## 2023-05-28 DIAGNOSIS — G4733 Obstructive sleep apnea (adult) (pediatric): Secondary | ICD-10-CM | POA: Diagnosis not present

## 2023-05-28 DIAGNOSIS — Y832 Surgical operation with anastomosis, bypass or graft as the cause of abnormal reaction of the patient, or of later complication, without mention of misadventure at the time of the procedure: Secondary | ICD-10-CM | POA: Diagnosis not present

## 2023-05-28 DIAGNOSIS — I871 Compression of vein: Secondary | ICD-10-CM | POA: Diagnosis not present

## 2023-05-28 DIAGNOSIS — I120 Hypertensive chronic kidney disease with stage 5 chronic kidney disease or end stage renal disease: Secondary | ICD-10-CM | POA: Insufficient documentation

## 2023-05-28 DIAGNOSIS — N186 End stage renal disease: Secondary | ICD-10-CM | POA: Diagnosis not present

## 2023-05-28 DIAGNOSIS — Z87891 Personal history of nicotine dependence: Secondary | ICD-10-CM | POA: Diagnosis not present

## 2023-05-28 SURGERY — A/V FISTULAGRAM
Anesthesia: LOCAL | Laterality: Left

## 2023-05-28 MED ORDER — FENTANYL CITRATE (PF) 100 MCG/2ML IJ SOLN
INTRAMUSCULAR | Status: AC
  Filled 2023-05-28: qty 2

## 2023-05-28 MED ORDER — LIDOCAINE HCL (PF) 1 % IJ SOLN
INTRAMUSCULAR | Status: DC | PRN
Start: 2023-05-28 — End: 2023-05-28
  Administered 2023-05-28: 2 mL via SUBCUTANEOUS

## 2023-05-28 MED ORDER — MIDAZOLAM HCL 2 MG/2ML IJ SOLN
INTRAMUSCULAR | Status: DC | PRN
  Administered 2023-05-28: 1 mg via INTRAVENOUS

## 2023-05-28 MED ORDER — SODIUM CHLORIDE 0.9 % IV SOLN: INTRAVENOUS | Status: DC

## 2023-05-28 MED ORDER — METHYLPREDNISOLONE SODIUM SUCC 125 MG IJ SOLR
INTRAMUSCULAR | Status: DC | PRN
  Administered 2023-05-28: 125 mg via INTRAVENOUS

## 2023-05-28 MED ORDER — MIDAZOLAM HCL 2 MG/2ML IJ SOLN
INTRAMUSCULAR | Status: AC
  Filled 2023-05-28: qty 2

## 2023-05-28 MED ORDER — FENTANYL CITRATE (PF) 100 MCG/2ML IJ SOLN
INTRAMUSCULAR | Status: DC | PRN
  Administered 2023-05-28: 25 ug via INTRAVENOUS

## 2023-05-28 MED ORDER — ACETAMINOPHEN 325 MG PO TABS: 650.0000 mg | ORAL_TABLET | ORAL | Status: DC | PRN

## 2023-05-28 MED ORDER — ONDANSETRON HCL 4 MG/2ML IJ SOLN: 4.0000 mg | Freq: Four times a day (QID) | INTRAMUSCULAR | Status: DC | PRN

## 2023-05-28 MED ORDER — HEPARIN (PORCINE) IN NACL 1000-0.9 UT/500ML-% IV SOLN
INTRAVENOUS | Status: DC | PRN
  Administered 2023-05-28: 500 mL

## 2023-05-28 MED ORDER — DIPHENHYDRAMINE HCL 50 MG/ML IJ SOLN
INTRAMUSCULAR | Status: DC | PRN
Start: 2023-05-28 — End: 2023-05-28
  Administered 2023-05-28: 50 mg via INTRAVENOUS

## 2023-05-28 MED ORDER — IODIXANOL 320 MG/ML IV SOLN
INTRAVENOUS | Status: DC | PRN
  Administered 2023-05-28: 9 mL via INTRAVENOUS

## 2023-05-28 SURGICAL SUPPLY — 7 items
BALLN MUSTANG 8.0X40 75 (BALLOONS) ×2 IMPLANT
BALLOON MUSTANG 8.0X40 75 (BALLOONS) IMPLANT
GUIDEWIRE ANGLED .035 180CM (WIRE) IMPLANT
KIT MICROPUNCTURE NIT STIFF (SHEATH) IMPLANT
PACK EP LF (CUSTOM PROCEDURE TRAY) IMPLANT
SHEATH PINNACLE R/O II 6F 4CM (SHEATH) IMPLANT
SYR MEDALLION 10ML (SYRINGE) IMPLANT

## 2023-05-28 NOTE — Discharge Instructions (Signed)

## 2023-05-28 NOTE — H&P (Signed)
 [facility]  INPATIENT CONSULTATION  Reason for Consultation: high VP Requesting Provider: Dr. Ronalee Belts  HPI: Michael [name] is an 48 y.o. male  with ESRD on HD MWF, HTN, HL, anemia, PKD, OSA, gout who present for evaluation of AVG malfunction.   He was seen here 03/26/23 for low AF and Kt/V - had PTA VA with 8mm.  Initially everything improved but lately has been having high VP.  Kt/V still remains adequate.  He has a possible h/o contrast allergy and rec'd IV medrol and benadryl at initiation of procedure after not taking pre meds.  He again has not taken pre meds today.    He is otherwise feeling well today.  He is NPO with a driver.   PMH: Past Medical History:  Diagnosis Date   Anemia in neoplastic disease    Compression of vein    Disorder of phosphorus metabolism    End stage renal disease (HCC) 12/11/2019   Essential hypertension, malignant    Familial lipoprotein deficiency    GERD (gastroesophageal reflux disease)    Gout    History of renal dialysis    M-W-F   HLD (hyperlipidemia)    Hypertension    Intracranial aneurysm    s/p coil embolization: right middle meningeal artery 09/08/20, pericallosal artery 09/22/20, right MCA 12/22/20   Iron deficiency anemia, unspecified    Melanoma in situ of eyelid (HCC)    Nontraumatic acute subdural hemorrhage (HCC)    Obesity, unspecified    Pneumonia    Polycystic kidney disease    Pruritus of skin    SDH (subdural hematoma) (HCC)    subacute right SDH 08/23/20 CTA head   Secondary hyperparathyroidism of renal origin [facility])    Sleep apnea 07/29/2018   uses cpap nightly   Vitamin D deficiency 11/2018   PSH: Past Surgical History:  Procedure Laterality Date   A/V FISTULAGRAM N/A 03/26/2023   Procedure: A/V Fistulagram;  Surgeon: Tyler Pita, MD;  [facility];  Service: Cardiovascular;  Laterality: N/A;   AV FISTULA PLACEMENT Left 03/24/2020   Procedure: INSERTION OF LEFT UPPER EXTREMITY  ARTERIOVENOUS (AV) GORE-TEX GRAFT;  Surgeon: Leonie Douglas, MD;  Location: [facility] OR;  Service: Vascular;  Laterality: Left;  PERIPHERAL NERVE BLOCK   BASCILIC VEIN TRANSPOSITION Left 12/11/2019   Procedure: 1ST STAGE BASILIC TRANSPOSITION OF LEFT ARM;  Surgeon: Chuck Hint, MD;  Location: [facility] OR;  Service: Vascular;  Laterality: Left;   BIOPSY  04/06/2021   Procedure: BIOPSY;  Surgeon: Tressia Danas, MD;  Location: [facility] ENDOS[facility];  Service: Gastroenterology;;   COLONOSCOPY WITH PROPOFOL N/A 04/06/2021   Procedure: COLONOSCOPY WITH PROPOFOL;  Surgeon: Tressia Danas, MD;  Location: [facility]OS[facility];  Service: Gastroenterology;  Laterality: N/A;   COLONOSCOPY WITH PROPOFOL N/A 05/29/2021   Procedure: COLONOSCOPY WITH PROPOFOL;  Surgeon: Meridee Score Netty Starring., MD;  Location: [facility];  Service: Gastroenterology;  Laterality: N/A;   COLONOSCOPY WITH PROPOFOL N/A 06/28/2022   Procedure: COLONOSCOPY WITH PROPOFOL;  Surgeon: Meridee Score Netty Starring., MD;  Location: [facility];  Service: Gastroenterology;  Laterality: N/A;   DG AV DIALYSIS GRAFT DECLOT OR Left 05/17/2020   ENDOSCOPIC MUCOSAL RESECTION  05/29/2021   Procedure: ENDOSCOPIC MUCOSAL RESECTION;  Surgeon: Meridee Score Netty Starring., MD;  Location: [facility];  Service: Gastroenterology;;   HEMOSTASIS CLIP PLACEMENT  05/29/2021   Procedure: HEMOSTASIS CLIP PLACEMENT;  Surgeon: Lemar Lofty., MD;  Location: [facility];  Service: Gastroenterology;;   IR AV DIALY SHUNT INTRO NEEDLE/INTRACATH  INITIAL W/PTA/IMG LEFT  05/01/2022   IR FLUORO GUIDE CV LINE RIGHT  12/09/2019   IR US GUIDE VASC ACCESS LEFT  05/01/2022   IR US GUIDE VASC ACCESS RIGHT  12/09/2019   LEFT HEART CATH AND CORONARY ANGIOGRAPHY N/A 12/09/2019   Procedure: LEFT HEART CATH AND CORONARY ANGIOGRAPHY;  Surgeon: Yates Decamp, MD;  [facility];  Service: Cardiovascular;  Laterality: N/A;   PERIPHERAL VASCULAR BALLOON ANGIOPLASTY Left  03/26/2023   Procedure: PERIPHERAL VASCULAR BALLOON ANGIOPLASTY;  Surgeon: Tyler Pita, MD;  [facility];  Service: Cardiovascular;  Laterality: Left;  left AVG 60% VA   POLYPECTOMY  04/06/2021   Procedure: POLYPECTOMY;  Surgeon: Tressia Danas, MD;  [facility]: [facility];  Service: Gastroenterology;;   POLYPECTOMY  05/29/2021   Procedure: POLYPECTOMY;  Surgeon: Lemar Lofty., MD;  Location: [facility];  Service: Gastroenterology;;   POLYPECTOMY  06/28/2022   Procedure: POLYPECTOMY;  Surgeon: Lemar Lofty., MD;  Location: [facility];  Service: Gastroenterology;;   REVISION OF ARTERIOVENOUS GORETEX GRAFT Left 08/31/2021   Procedure: REDO LEFT UPPER ARM ARTERIOVENOUS GORETEX GRAFT;  Surgeon: Victorino Sparrow, MD;  Location: [facility] OR;  Service: Vascular;  Laterality: Left;  PERIPHERAL NERVE BLOCK   SUBMUCOSAL INJECTION  05/29/2021   Procedure: SUBMUCOSAL INJECTION;  Surgeon: Lemar Lofty., MD;  Location: [facility];  Service: Gastroenterology;;  EPI   SUBMUCOSAL LIFTING INJECTION  05/29/2021   Procedure: SUBMUCOSAL LIFTING INJECTION;  Surgeon: Lemar Lofty., MD;  Location: [facility];  Service: Gastroenterology;;  [facility]   SUBMUCOSAL TATTOO INJECTION  04/06/2021   Procedure: SUBMUCOSAL TATTOO INJECTION;  Surgeon: Tressia Danas, MD;  [facility]: [facility];  Service: Gastroenterology;;   UPPER EXTREMITY VENOGRAPHY Bilateral 08/24/2021   Procedure: UPPER EXTREMITY VENOGRAPHY;  Surgeon: Cephus Shelling, MD;  [facility];  Service: Cardiovascular;  Laterality: Bilateral;    Past Medical History:  Diagnosis Date   Anemia in neoplastic disease    Compression of vein    Disorder of phosphorus metabolism    End stage renal disease (HCC) 12/11/2019   Essential hypertension, malignant    Familial lipoprotein deficiency    GERD (gastroesophageal reflux disease)    Gout    History of renal dialysis     M-W-F   HLD (hyperlipidemia)    Hypertension    Intracranial aneurysm    s/p coil embolization: right middle meningeal artery 09/08/20, pericallosal artery 09/22/20, right MCA 12/22/20   Iron deficiency anemia, unspecified    Melanoma in situ of eyelid (HCC)    Nontraumatic acute subdural hemorrhage (HCC)    Obesity, unspecified    Pneumonia    Polycystic kidney disease    Pruritus of skin    SDH (subdural hematoma) (HCC)    subacute right SDH 08/23/20 CTA head   Secondary hyperparathyroidism of renal origin [facility])    Sleep apnea 07/29/2018   uses cpap nightly   Vitamin D deficiency 11/2018    Medications:  I have reviewed the patient's current medications.  Medications Prior to Admission  Medication Sig Dispense Refill   AURYXIA 1 GM 210 MG(Fe) tablet Take 420 mg by mouth 3 (three) times daily with meals.     Multiple Vitamin (MULTIVITAMIN) tablet Take 1 tablet by mouth daily.      ALLERGIES:   Allergies  Allergen Reactions   Ivp Dye [Iodinated Contrast Media] Itching    FAM HX: Family History  Problem Relation Age of Onset  Diabetes Mother    Ovarian cancer Mother    Bipolar disorder Father    Hypertension Father    Prostate cancer Father    Prostate cancer Paternal Grandfather    Polycystic kidney disease Neg Hx     Social History:   reports that he quit smoking about 10 years ago. His smoking use included cigarettes. He started smoking about 20 years ago. He has a 2 pack-year smoking history. He has never used smokeless tobacco. He reports that he does not currently use alcohol. He reports that he does not use drugs.  ROS: 12 system Relevant ROS neg per HPI   Blood pressure (!) 128/100, pulse 96, resp. rate 14, SpO2 97%. PHYSICAL EXAM: Gen:  well appearing  Eyes: EOMI ENT: class 3 airway, good dentition Neck: thick CV:  RRR Lungs: clear on RA  Neuro:nonfocal LUE AVG lateral to prior AVG - +pulsatile, 2 small pseudoaneurysms in the cannulation zone, normal  overlying skin.   No results found for this or any previous visit (from the past 48 hours).  No results found.  Assessment/PlanEtiun Shear is an 48 y.o. male with ESRD on HD MWF, HTN, HL, anemia, PKD, OSA, gout who present for evaluation of AVG malfunction.    **ESRD with AVG malfunction: high VP with e/o outflow stenosis, consented for graftogram with planned intervention if stenosis identified.  If recurrent VA stenosis returning < 3 mo after last intervention, may benefit from stenting.  Reported mild contrast allergy but did not premedicate.  Will give solumedrol 125 and benadryl 25 IV at start of procedure.  Patient consents to proceed.   Tyler Pita 05/28/2023, 7:08 AM

## 2023-05-28 NOTE — Op Note (Signed)
 Patient presents with high VP and prolonged cannulation site bleeding (mainly arterial needle) from his LUE AVG.    The graft was placed in 08/2021 and is lateral to a previous failed AVG.  He had VA PTA here 2 months ago.  On exam the RUA arc AVG is hyperpulsatile.   There are 2 small pseudoaneurysms in the cannulation zone.    Summary:  1)         Successful angiogram of a right upper arm straight AVG   with evidence of a 60% venous anastomosis to axillary vein stenosis treated to 10% with a 8 mm Mustang FE ~22atm and 50% intragraft stenosis (between the 2 pseudoaneurysms) treated to 10% with 8mm Mustang FE ~16 ATM.   2)The body of the graft, central veins, and inflow were widely patent. 3)         This right upper arm straight graft remains amenable to future percutaneous intervention.    Description of procedure: The right upper arm was prepped and draped in the usual fashion. The right upper arm straight AVG was cannulated ([id number]) in the arterial limb of the graft in an antegrade direction with an 21g micropuncture needle and then a 6 Fr sheath was inserted by guidewire exchange technique. The angiogram revealed a patent body of the graft with a 60% venous anastomosis stenosis. The body of the graft, central veins and inflow were patent.   A guidewire was easily advanced past the outflow stenosis and parked in the central veins. I then advanced an 8 x 40mm Mustang angioplasty balloon through the antegrade sheath over the guidewire to the level of the venous anastomosis to axillary vein stenosis. Venous angioplasty was performed to 100% balloon effacement with approximately 22ATM of pressure via a hand syringe assembly.   Next the balloon was withdrawn to the level with the intragraft stenosis and angioplasty performed to 100% balloon effacement at 16 ATM.  Final arteriogram and completion venogram revealed no evidence of extravasation or dissection, more rapid access flows through the graft and 10%  residual stenosis at each site treated.   Hemostasis: A 3-0 ethilon purse string suture was placed at the cannulation site on removal of the sheath.   Sedation: 1mg  Versed, Fentanyl   Contrast. 9 mL   Monitoring: Because of the patient's comorbid conditions and sedation during the procedure, continuous EKG monitoring and O2 saturation monitoring was performed throughout the procedure by the RN. There were no abnormal arrhythmias encountered.   Complications: None.    Diagnoses: I87.1 Stricture of vein  N18.6 ESRD T82.858A Stricture of access   Procedure Coding:  [phone] Cannulation and angiogram of fistula, venous angioplasty [id number] Contrast   Recommendations:  1. Continue to cannulate the graft with 15G needles.  2. Refer back for problems with flows. 3. Remove the suture next treatment.    Discharge: The patient was discharged home in stable condition. The patient was given education regarding the care of the dialysis access and specific instructions in case of any problems.

## 2023-05-29 ENCOUNTER — Encounter (HOSPITAL_COMMUNITY): Payer: Self-pay | Admitting: Internal Medicine

## 2023-05-29 DIAGNOSIS — D689 Coagulation defect, unspecified: Secondary | ICD-10-CM | POA: Diagnosis not present

## 2023-05-29 DIAGNOSIS — Z992 Dependence on renal dialysis: Secondary | ICD-10-CM | POA: Diagnosis not present

## 2023-05-29 DIAGNOSIS — N2581 Secondary hyperparathyroidism of renal origin: Secondary | ICD-10-CM | POA: Diagnosis not present

## 2023-05-29 DIAGNOSIS — N186 End stage renal disease: Secondary | ICD-10-CM | POA: Diagnosis not present

## 2023-05-31 DIAGNOSIS — N2581 Secondary hyperparathyroidism of renal origin: Secondary | ICD-10-CM | POA: Diagnosis not present

## 2023-05-31 DIAGNOSIS — D689 Coagulation defect, unspecified: Secondary | ICD-10-CM | POA: Diagnosis not present

## 2023-05-31 DIAGNOSIS — Z992 Dependence on renal dialysis: Secondary | ICD-10-CM | POA: Diagnosis not present

## 2023-05-31 DIAGNOSIS — N186 End stage renal disease: Secondary | ICD-10-CM | POA: Diagnosis not present

## 2023-06-03 ENCOUNTER — Ambulatory Visit: Payer: Self-pay | Admitting: Licensed Clinical Social Worker

## 2023-06-03 DIAGNOSIS — D689 Coagulation defect, unspecified: Secondary | ICD-10-CM | POA: Diagnosis not present

## 2023-06-03 DIAGNOSIS — N186 End stage renal disease: Secondary | ICD-10-CM | POA: Diagnosis not present

## 2023-06-03 DIAGNOSIS — N2581 Secondary hyperparathyroidism of renal origin: Secondary | ICD-10-CM | POA: Diagnosis not present

## 2023-06-03 DIAGNOSIS — Z992 Dependence on renal dialysis: Secondary | ICD-10-CM | POA: Diagnosis not present

## 2023-06-03 NOTE — Patient Instructions (Signed)
 Visit Information  Thank you for taking time to visit with me today. Please don't hesitate to contact me if I can be of assistance to you.   Following are the goals we discussed today:   Goals Addressed             This Visit's Progress    patient has financial stressors. Patient goes to dialysis 3 times weekly       Interventions: Spoke with client via phone today about client needs. Discussed program support with RN, LCSW, Pharmacist Discussed medications procurement Client has [facility] and Medicaid.  Client has U Card he uses as needed. U card is helpful to client Client does have an aunt who lives in area and can provide some support Discussed transport needs. Client has a car he drives as needed to medical appointments and to dialysis. At dialysis center, client can talk with SW and Dietician about client needs. Client said he has names of several food pantries but he does not want to eat a lot of processed foods Discussed energy level of client. He said he is fatigued after dialysis treatments. Discussed sleeping issues. He has C-PAP machine but wants to talk with NP about C-PAP needs He resides in apartment; but, he claims that the rent is high and it is hard to pay rent and other monthly bills. Financial stressors are present.  He said he has started to receive bills from [facility] or [facility].  He has bill with [facility] Client said he has met with representative of DSS in [place], [place] about his current financial needs and status.  He has financial strain Used Sales executive to hear needs of client Client said that his light bill is usually high. It is hard for him to have enough money to buy healthy foods. Discussed pain issues of client He said sometimes after dialysis treatments he has some numbness in his feet Client previously said he receives Food Stamps benefit each month. Provided counseling support for client Thanked client for phone call with  LCSW today Encouraged client to call LCSW as needed for SW support for client at [phone] Client was appreciative of call from LCSW today         LCSW has provided client with LCSW name and phone number. LCSW has encouraged Jamai to call LCSW as needed for SW support for client at [phone]  Please call the care guide team at [phone] if you need to cancel or reschedule your appointment.   If you are experiencing a Mental Health or Behavioral Health Crisis or need someone to talk to, please go to [facility] [address], [place] [phone])   The patient verbalized understanding of instructions, educational materials, and care plan provided today and DECLINED offer to receive copy of patient instructions, educational materials, and care plan.   The patient has been provided with contact information for the care management team and has been advised to call with any health related questions or concerns.    Lorna Few  MSW, LCSW [place] Licensed Clinical Social Worker Direct Dial:  [phone] Fax:  [phone] Website:  [email or web address]

## 2023-06-03 NOTE — Patient Outreach (Signed)
 Care Coordination   Follow Up Visit Note   06/03/2023 Name: Michael [name] MRN: [id number] DOB: 07-31-1975  Michael [name] is a 48 y.o. year old male who sees Michael Reedy, FNP for primary care. I spoke with  Michael [name] by phone today.  What matters to the patients health and wellness today? patient has financial stressors. Patient goes to [name] 3 times weekly     Goals Addressed             This Visit's Progress    patient has financial stressors. Patient goes to [name] 3 times weekly       Interventions: Spoke with client via phone today about client needs. Discussed program support with RN, LCSW, Pharmacist Discussed medications procurement Client has Michael [name] and Medicaid.  Client has U Card he uses as needed. U card is helpful to client Client does have an aunt who lives in area and can provide some support Discussed transport needs. Client has a car he drives as needed to medical appointments and to [name]. At [name], client can talk with SW and Dietician about client needs. Client said he has names of several food pantries but he does not want to eat a lot of processed foods Discussed energy level of client. He said he is fatigued after [name] treatments. Discussed sleeping issues. He has C-PAP machine but wants to talk with NP about C-PAP needs He resides in apartment; but, he claims that the rent is high and it is hard to pay rent and other monthly bills. Financial stressors are present.  He said he has started to receive bills from [name] or [name].  He has bill with [name] Client said he has met with representative of DSS in [place], [place] about his current financial needs and status.  He has financial strain Used Sales executive to hear needs of client Client said that his light bill is usually high. It is hard for him to have enough money to buy healthy foods. Discussed pain issues of client He said sometimes after [name]  treatments he has some numbness in his feet Client previously said he receives Food Stamps benefit each month. Provided counseling support for client Thanked client for phone call with LCSW today Encouraged client to call LCSW as needed for SW support for client at [phone] Client was appreciative of call from LCSW today          SDOH assessments and interventions completed:  Yes  SDOH Interventions Today    Flowsheet Row Most Recent Value  SDOH Interventions   Physical Activity Interventions Other (Comments)  [client has occasional mobility issues. He has low energy sometimes and has to take rest breaks. Occasional shortness of breath]  Stress Interventions Provide Counseling  [client has stress related to financial needs. client has stress in managing medical needs]        Care Coordination Interventions:  Yes, provided     Interventions Today    Flowsheet Row Most Recent Value  Chronic Disease   Chronic disease during today's visit Other  [spoke with client via phone today about client needs]  General Interventions   General Interventions Discussed/Reviewed General Interventions Discussed, Community Resources  Education Interventions   Education Provided Provided Education  Provided Verbal Education On [employer]  Mental Health Interventions   Mental Health Discussed/Reviewed Coping Strategies  [client has financial stress issues,  he has stress in managing medical needs. He takes dialsysis treatments as scheduled 3 times weekly]  Nutrition  Interventions   Nutrition Discussed/Reviewed Nutrition Discussed  Pharmacy Interventions   Pharmacy Dicussed/Reviewed Pharmacy Topics Discussed  Safety Interventions   Safety Discussed/Reviewed Fall Risk        Follow up plan: client has LCSW name and phone number. LCSW encouraged client to call LCSW as needed for SW support at [phone]   Encounter Outcome:  Patient Visit Completed    Michael [name]  MSW,  LCSW [name] Licensed Clinical Social Worker Direct Dial:  [phone] Fax:  [phone] Website:  [email or web address]

## 2023-06-05 DIAGNOSIS — N186 End stage renal disease: Secondary | ICD-10-CM | POA: Diagnosis not present

## 2023-06-05 DIAGNOSIS — D689 Coagulation defect, unspecified: Secondary | ICD-10-CM | POA: Diagnosis not present

## 2023-06-05 DIAGNOSIS — Z992 Dependence on renal dialysis: Secondary | ICD-10-CM | POA: Diagnosis not present

## 2023-06-05 DIAGNOSIS — N2581 Secondary hyperparathyroidism of renal origin: Secondary | ICD-10-CM | POA: Diagnosis not present

## 2023-06-07 DIAGNOSIS — N2581 Secondary hyperparathyroidism of renal origin: Secondary | ICD-10-CM | POA: Diagnosis not present

## 2023-06-07 DIAGNOSIS — Z992 Dependence on renal dialysis: Secondary | ICD-10-CM | POA: Diagnosis not present

## 2023-06-07 DIAGNOSIS — D689 Coagulation defect, unspecified: Secondary | ICD-10-CM | POA: Diagnosis not present

## 2023-06-07 DIAGNOSIS — N186 End stage renal disease: Secondary | ICD-10-CM | POA: Diagnosis not present

## 2023-06-10 DIAGNOSIS — Z992 Dependence on renal dialysis: Secondary | ICD-10-CM | POA: Diagnosis not present

## 2023-06-10 DIAGNOSIS — D689 Coagulation defect, unspecified: Secondary | ICD-10-CM | POA: Diagnosis not present

## 2023-06-10 DIAGNOSIS — N186 End stage renal disease: Secondary | ICD-10-CM | POA: Diagnosis not present

## 2023-06-10 DIAGNOSIS — N2581 Secondary hyperparathyroidism of renal origin: Secondary | ICD-10-CM | POA: Diagnosis not present

## 2023-06-11 ENCOUNTER — Ambulatory Visit: Payer: Self-pay | Admitting: Licensed Clinical Social Worker

## 2023-06-11 NOTE — Patient Outreach (Signed)
 Care Coordination   Follow Up Visit Note   06/11/2023 Name: Michael [name] MRN: [id number] DOB: 07/04/1975  Michael [name] is a 48 y.o. year old male who sees Michael Reedy, FNP for primary care. I spoke with  Michael [name] by phone today.  What matters to the patients health and wellness today?  patient has financial stressors. Patient goes to dialysis 3 times weekly     Goals Addressed             This Visit's Progress    patient has financial stressors. Patient goes to dialysis 3 times weekly       Interventions: Spoke with client via phone today about client needs and status. Discussed program support with RN, LCSW, Pharmacist Client said he is in process of getting his taxes completed Client goes to dialysis 3 times weekly for dialysis treatments. He has said he can talk with SW and Dietician at dialysis center about resources available for client. Client has said SW has given him list of food pantries in area but most of food available at food pantries is processed food and he cannot eat processed food. He is trying to find fresh food and vegetables to eat regularly Discussed medications procurement Client has [name] and Medicaid.  Client has U Card he uses as needed. U card is helpful to client Client does have an aunt who lives in area and can provide some support Discussed transport needs. Client has a car he drives as needed to medical appointments and to dialysis. Discussed energy level of client. He said he is fatigued after dialysis treatments. Discussed sleeping issues. He has C-PAP machine but wants to talk with NP about C-PAP needs Discussed client ambulation. He said sometimes he has numbness and burning in his feet after dialysis. Discussed client support with FNP [name].  Client has medical appointent at FNP office next month.  Client said he used to get Mom's Meals but it only lasted for 30 days. He said food with Mom's Meals was helpful and he enjoyed eating this  food.  Client does not want to eat processed food. He wants fresh food and fresh vegetables. Client said he is on waiting list for kidney through [name]). He said he has been on waiting list for kidney for almost 4 years.  Provided counseling support for client Talked with client about his waiting on Kidney transplant list.  Client talked of process of getting on Kidney Transplant list and guidelines he has to follow to stay on Kidney Transplant list. Client is really trying to locate a food source for healthy , fresh foods that are affordable. Unfortunately, he said his rent will go up another $100.00 next month.  It is hard for him to pay rent currently and with a $100.00 rent increase in May, rent costs will  be difficult to pay for [name] Discussed financial stressors of client. He has to use U Card benefit to help in paying some of his utility costs. He has trouble getting gas and paying for car insurance LCSW had list of Food Pantries and Resources in [name], [place]. LCSW reviewed with [name] some of these [employer] and Food resources locations in [place], [place].  Client said he had been to several agencies on list mentioned.  Again, it is hard  for him to locate a source for fresh foods and fresh vegetables.which are affordable Client said he has met with representative of DSS in [place], [place]  about his current financial needs and status.  He has financial strain Used Sales executive to hear needs of client Client said that his light bill is usually high. It is hard for him to have enough money to buy healthy foods. Discussed pain issues of client He said sometimes after dialysis treatments he has some numbness in his feet and burning in his feet Thanked client for phone call with LCSW today Encouraged client to call LCSW as needed for SW support for client at [phone] Client was appreciative of call from LCSW today          SDOH  assessments and interventions completed:  Yes  SDOH Interventions Today    Flowsheet Row Most Recent Value  SDOH Interventions   Financial Strain Interventions Other (Comment)  [client receives Food Stamps benefit. Has difficulty paying rent, utility costs]  Physical Activity Interventions Other (Comments)  [fatigue issues. receives dialysis treatments 3 times weekly]  Stress Interventions Provide Counseling  [has stress in managing medical needs]        Care Coordination Interventions:  Yes, provided   Interventions Today    Flowsheet Row Most Recent Value  Chronic Disease   Chronic disease during today's visit Other  [spoke with client about client needs]  General Interventions   General Interventions Discussed/Reviewed General Interventions Discussed, Community Resources  Education Interventions   Education Provided Provided Education  Provided Verbal Education On [employer]  Mental Health Interventions   Mental Health Discussed/Reviewed Coping Strategies  Nutrition Interventions   Nutrition Discussed/Reviewed Nutrition Discussed  Pharmacy Interventions   Pharmacy Dicussed/Reviewed Pharmacy Topics Discussed        Follow up plan: LCSW has given [name] LCSW name and phone number to utilize.  LCSW has encouraged Michael [name] to call LCSW as needed for SW support at [phone]  Encounter Outcome:  Patient Visit Completed    Michael [name]  MSW, LCSW [name] Licensed Clinical Social Worker Direct Dial:  [phone] Fax:  [phone] Website:  [email or web address]

## 2023-06-11 NOTE — Patient Instructions (Signed)
 Visit Information  Thank you for taking time to visit with me today. Please don't hesitate to contact me if I can be of assistance to you.   Following are the goals we discussed today:   Goals Addressed             This Visit's Progress    patient has financial stressors. Patient goes to dialysis 3 times weekly       Interventions: Spoke with client via phone today about client needs and status. Discussed program support with RN, LCSW, Pharmacist Client said he is in process of getting his taxes completed Client goes to dialysis 3 times weekly for dialysis treatments. He has said he can talk with SW and Dietician at dialysis center about resources available for client. Client has said SW has given him list of food pantries in area but most of food available at food pantries is processed food and he cannot eat processed food. He is trying to find fresh food and vegetables to eat regularly Discussed medications procurement Client has [facility] and Medicaid.  Client has U Card he uses as needed. U card is helpful to client Client does have an aunt who lives in area and can provide some support Discussed transport needs. Client has a car he drives as needed to medical appointments and to dialysis. Discussed energy level of client. He said he is fatigued after dialysis treatments. Discussed sleeping issues. He has C-PAP machine but wants to talk with NP about C-PAP needs Discussed client ambulation. He said sometimes he has numbness and burning in his feet after dialysis. Discussed client support with FNP Alyson Reedy.  Client has medical appointent at FNP office next month.  Client said he used to get Mom's Meals but it only lasted for 30 days. He said food with Mom's Meals was helpful and he enjoyed eating this food.  Client does not want to eat processed food. He wants fresh food and fresh vegetables. Client said he is on waiting list for kidney through [facility]). He said he has been on waiting list for kidney for almost 4 years.  Provided counseling support for client Talked with client about his waiting on Kidney transplant list.  Client talked of process of getting on Kidney Transplant list and guidelines he has to follow to stay on Kidney Transplant list. Client is really trying to locate a food source for healthy , fresh foods that are affordable. Unfortunately, he said his rent will go up another $100.00 next month.  It is hard for him to pay rent currently and with a $100.00 rent increase in May, rent costs will  be difficult to pay for [place] Discussed financial stressors of client. He has to use U Card benefit to help in paying some of his utility costs. He has trouble getting gas and paying for car insurance LCSW had list of Food Pantries and Resources in [place], [place]. LCSW reviewed with [name] some of these [employer] and Food resources locations in [place], [place].  Client said he had been to several agencies on list mentioned.  Again, it is hard  for him to locate a source for fresh foods and fresh vegetables.which are affordable Client said he has met with representative of DSS in [place], [place] about his current financial needs and status.  He has financial strain Used Sales executive to hear needs of client Client said that his light bill is usually high. It is hard  for him to have enough money to buy healthy foods. Discussed pain issues of client He said sometimes after dialysis treatments he has some numbness in his feet and burning in his feet Thanked client for phone call with LCSW today Encouraged client to call LCSW as needed for SW support for client at [phone] Client was appreciative of call from LCSW today         LCSW has given Kadrian LCSW name and phone number to utilize.  LCSW has encouraged Jay[place]un to call LCSW as needed for SW support at [phone]  Please call the care guide team at  [phone] if you need to cancel or reschedule your appointment.   If you are experiencing a Mental Health or Behavioral Health Crisis or need someone to talk to, please go to [facility] [address] [place] [phone])   The patient verbalized understanding of instructions, educational materials, and care plan provided today and DECLINED offer to receive copy of patient instructions, educational materials, and care plan.   The patient has been provided with contact information for the care management team and has been advised to call with any health related questions or concerns.    Lorna Few  MSW, LCSW [place] Licensed Clinical Social Worker Direct Dial:  [phone] Fax:  [phone] Website:  [email or web address]

## 2023-06-12 DIAGNOSIS — Z992 Dependence on renal dialysis: Secondary | ICD-10-CM | POA: Diagnosis not present

## 2023-06-12 DIAGNOSIS — D689 Coagulation defect, unspecified: Secondary | ICD-10-CM | POA: Diagnosis not present

## 2023-06-12 DIAGNOSIS — N2581 Secondary hyperparathyroidism of renal origin: Secondary | ICD-10-CM | POA: Diagnosis not present

## 2023-06-12 DIAGNOSIS — N186 End stage renal disease: Secondary | ICD-10-CM | POA: Diagnosis not present

## 2023-06-14 DIAGNOSIS — N2581 Secondary hyperparathyroidism of renal origin: Secondary | ICD-10-CM | POA: Diagnosis not present

## 2023-06-14 DIAGNOSIS — N186 End stage renal disease: Secondary | ICD-10-CM | POA: Diagnosis not present

## 2023-06-14 DIAGNOSIS — Z992 Dependence on renal dialysis: Secondary | ICD-10-CM | POA: Diagnosis not present

## 2023-06-14 DIAGNOSIS — D689 Coagulation defect, unspecified: Secondary | ICD-10-CM | POA: Diagnosis not present

## 2023-06-17 DIAGNOSIS — N2581 Secondary hyperparathyroidism of renal origin: Secondary | ICD-10-CM | POA: Diagnosis not present

## 2023-06-17 DIAGNOSIS — Z992 Dependence on renal dialysis: Secondary | ICD-10-CM | POA: Diagnosis not present

## 2023-06-17 DIAGNOSIS — D689 Coagulation defect, unspecified: Secondary | ICD-10-CM | POA: Diagnosis not present

## 2023-06-17 DIAGNOSIS — N186 End stage renal disease: Secondary | ICD-10-CM | POA: Diagnosis not present

## 2023-06-19 DIAGNOSIS — D689 Coagulation defect, unspecified: Secondary | ICD-10-CM | POA: Diagnosis not present

## 2023-06-19 DIAGNOSIS — N186 End stage renal disease: Secondary | ICD-10-CM | POA: Diagnosis not present

## 2023-06-19 DIAGNOSIS — N2581 Secondary hyperparathyroidism of renal origin: Secondary | ICD-10-CM | POA: Diagnosis not present

## 2023-06-19 DIAGNOSIS — Z992 Dependence on renal dialysis: Secondary | ICD-10-CM | POA: Diagnosis not present

## 2023-06-21 DIAGNOSIS — D689 Coagulation defect, unspecified: Secondary | ICD-10-CM | POA: Diagnosis not present

## 2023-06-21 DIAGNOSIS — N2581 Secondary hyperparathyroidism of renal origin: Secondary | ICD-10-CM | POA: Diagnosis not present

## 2023-06-21 DIAGNOSIS — N186 End stage renal disease: Secondary | ICD-10-CM | POA: Diagnosis not present

## 2023-06-21 DIAGNOSIS — Z992 Dependence on renal dialysis: Secondary | ICD-10-CM | POA: Diagnosis not present

## 2023-06-24 DIAGNOSIS — N2581 Secondary hyperparathyroidism of renal origin: Secondary | ICD-10-CM | POA: Diagnosis not present

## 2023-06-24 DIAGNOSIS — Z992 Dependence on renal dialysis: Secondary | ICD-10-CM | POA: Diagnosis not present

## 2023-06-24 DIAGNOSIS — D689 Coagulation defect, unspecified: Secondary | ICD-10-CM | POA: Diagnosis not present

## 2023-06-24 DIAGNOSIS — N186 End stage renal disease: Secondary | ICD-10-CM | POA: Diagnosis not present

## 2023-06-25 ENCOUNTER — Ambulatory Visit (HOSPITAL_BASED_OUTPATIENT_CLINIC_OR_DEPARTMENT_OTHER): Admitting: Family Medicine

## 2023-06-26 DIAGNOSIS — D689 Coagulation defect, unspecified: Secondary | ICD-10-CM | POA: Diagnosis not present

## 2023-06-26 DIAGNOSIS — Z992 Dependence on renal dialysis: Secondary | ICD-10-CM | POA: Diagnosis not present

## 2023-06-26 DIAGNOSIS — N2581 Secondary hyperparathyroidism of renal origin: Secondary | ICD-10-CM | POA: Diagnosis not present

## 2023-06-26 DIAGNOSIS — N186 End stage renal disease: Secondary | ICD-10-CM | POA: Diagnosis not present

## 2023-06-28 DIAGNOSIS — N2581 Secondary hyperparathyroidism of renal origin: Secondary | ICD-10-CM | POA: Diagnosis not present

## 2023-06-28 DIAGNOSIS — N186 End stage renal disease: Secondary | ICD-10-CM | POA: Diagnosis not present

## 2023-06-28 DIAGNOSIS — Z992 Dependence on renal dialysis: Secondary | ICD-10-CM | POA: Diagnosis not present

## 2023-06-28 DIAGNOSIS — D689 Coagulation defect, unspecified: Secondary | ICD-10-CM | POA: Diagnosis not present

## 2023-07-01 DIAGNOSIS — N186 End stage renal disease: Secondary | ICD-10-CM | POA: Diagnosis not present

## 2023-07-01 DIAGNOSIS — D689 Coagulation defect, unspecified: Secondary | ICD-10-CM | POA: Diagnosis not present

## 2023-07-01 DIAGNOSIS — Z992 Dependence on renal dialysis: Secondary | ICD-10-CM | POA: Diagnosis not present

## 2023-07-01 DIAGNOSIS — N2581 Secondary hyperparathyroidism of renal origin: Secondary | ICD-10-CM | POA: Diagnosis not present

## 2023-07-03 DIAGNOSIS — Z992 Dependence on renal dialysis: Secondary | ICD-10-CM | POA: Diagnosis not present

## 2023-07-03 DIAGNOSIS — N186 End stage renal disease: Secondary | ICD-10-CM | POA: Diagnosis not present

## 2023-07-03 DIAGNOSIS — N2581 Secondary hyperparathyroidism of renal origin: Secondary | ICD-10-CM | POA: Diagnosis not present

## 2023-07-03 DIAGNOSIS — D689 Coagulation defect, unspecified: Secondary | ICD-10-CM | POA: Diagnosis not present

## 2023-07-05 DIAGNOSIS — Z992 Dependence on renal dialysis: Secondary | ICD-10-CM | POA: Diagnosis not present

## 2023-07-05 DIAGNOSIS — D689 Coagulation defect, unspecified: Secondary | ICD-10-CM | POA: Diagnosis not present

## 2023-07-05 DIAGNOSIS — N186 End stage renal disease: Secondary | ICD-10-CM | POA: Diagnosis not present

## 2023-07-05 DIAGNOSIS — N2581 Secondary hyperparathyroidism of renal origin: Secondary | ICD-10-CM | POA: Diagnosis not present

## 2023-07-08 DIAGNOSIS — N2581 Secondary hyperparathyroidism of renal origin: Secondary | ICD-10-CM | POA: Diagnosis not present

## 2023-07-08 DIAGNOSIS — Z992 Dependence on renal dialysis: Secondary | ICD-10-CM | POA: Diagnosis not present

## 2023-07-08 DIAGNOSIS — N186 End stage renal disease: Secondary | ICD-10-CM | POA: Diagnosis not present

## 2023-07-08 DIAGNOSIS — D689 Coagulation defect, unspecified: Secondary | ICD-10-CM | POA: Diagnosis not present

## 2023-07-10 DIAGNOSIS — D689 Coagulation defect, unspecified: Secondary | ICD-10-CM | POA: Diagnosis not present

## 2023-07-10 DIAGNOSIS — Z992 Dependence on renal dialysis: Secondary | ICD-10-CM | POA: Diagnosis not present

## 2023-07-10 DIAGNOSIS — N186 End stage renal disease: Secondary | ICD-10-CM | POA: Diagnosis not present

## 2023-07-10 DIAGNOSIS — N2581 Secondary hyperparathyroidism of renal origin: Secondary | ICD-10-CM | POA: Diagnosis not present

## 2023-07-12 DIAGNOSIS — D689 Coagulation defect, unspecified: Secondary | ICD-10-CM | POA: Diagnosis not present

## 2023-07-12 DIAGNOSIS — N186 End stage renal disease: Secondary | ICD-10-CM | POA: Diagnosis not present

## 2023-07-12 DIAGNOSIS — N2581 Secondary hyperparathyroidism of renal origin: Secondary | ICD-10-CM | POA: Diagnosis not present

## 2023-07-12 DIAGNOSIS — Z992 Dependence on renal dialysis: Secondary | ICD-10-CM | POA: Diagnosis not present

## 2023-07-15 DIAGNOSIS — D689 Coagulation defect, unspecified: Secondary | ICD-10-CM | POA: Diagnosis not present

## 2023-07-15 DIAGNOSIS — N2581 Secondary hyperparathyroidism of renal origin: Secondary | ICD-10-CM | POA: Diagnosis not present

## 2023-07-15 DIAGNOSIS — N186 End stage renal disease: Secondary | ICD-10-CM | POA: Diagnosis not present

## 2023-07-15 DIAGNOSIS — Z992 Dependence on renal dialysis: Secondary | ICD-10-CM | POA: Diagnosis not present

## 2023-07-16 ENCOUNTER — Ambulatory Visit (HOSPITAL_BASED_OUTPATIENT_CLINIC_OR_DEPARTMENT_OTHER): Admitting: Family Medicine

## 2023-07-17 DIAGNOSIS — N186 End stage renal disease: Secondary | ICD-10-CM | POA: Diagnosis not present

## 2023-07-17 DIAGNOSIS — Z992 Dependence on renal dialysis: Secondary | ICD-10-CM | POA: Diagnosis not present

## 2023-07-17 DIAGNOSIS — D689 Coagulation defect, unspecified: Secondary | ICD-10-CM | POA: Diagnosis not present

## 2023-07-17 DIAGNOSIS — N2581 Secondary hyperparathyroidism of renal origin: Secondary | ICD-10-CM | POA: Diagnosis not present

## 2023-07-19 DIAGNOSIS — Z992 Dependence on renal dialysis: Secondary | ICD-10-CM | POA: Diagnosis not present

## 2023-07-19 DIAGNOSIS — N2581 Secondary hyperparathyroidism of renal origin: Secondary | ICD-10-CM | POA: Diagnosis not present

## 2023-07-19 DIAGNOSIS — D689 Coagulation defect, unspecified: Secondary | ICD-10-CM | POA: Diagnosis not present

## 2023-07-19 DIAGNOSIS — N186 End stage renal disease: Secondary | ICD-10-CM | POA: Diagnosis not present

## 2023-07-22 DIAGNOSIS — Z992 Dependence on renal dialysis: Secondary | ICD-10-CM | POA: Diagnosis not present

## 2023-07-22 DIAGNOSIS — N2581 Secondary hyperparathyroidism of renal origin: Secondary | ICD-10-CM | POA: Diagnosis not present

## 2023-07-22 DIAGNOSIS — N186 End stage renal disease: Secondary | ICD-10-CM | POA: Diagnosis not present

## 2023-07-22 DIAGNOSIS — D689 Coagulation defect, unspecified: Secondary | ICD-10-CM | POA: Diagnosis not present

## 2023-07-24 DIAGNOSIS — N2581 Secondary hyperparathyroidism of renal origin: Secondary | ICD-10-CM | POA: Diagnosis not present

## 2023-07-24 DIAGNOSIS — Z992 Dependence on renal dialysis: Secondary | ICD-10-CM | POA: Diagnosis not present

## 2023-07-24 DIAGNOSIS — N186 End stage renal disease: Secondary | ICD-10-CM | POA: Diagnosis not present

## 2023-07-24 DIAGNOSIS — D689 Coagulation defect, unspecified: Secondary | ICD-10-CM | POA: Diagnosis not present

## 2023-07-25 ENCOUNTER — Encounter (HOSPITAL_BASED_OUTPATIENT_CLINIC_OR_DEPARTMENT_OTHER): Payer: Self-pay

## 2023-07-25 ENCOUNTER — Other Ambulatory Visit (HOSPITAL_BASED_OUTPATIENT_CLINIC_OR_DEPARTMENT_OTHER): Payer: Self-pay

## 2023-07-25 ENCOUNTER — Emergency Department (HOSPITAL_BASED_OUTPATIENT_CLINIC_OR_DEPARTMENT_OTHER)
Admission: EM | Admit: 2023-07-25 | Discharge: 2023-07-25 | Disposition: A | Discharge status: Home / Self Care | Attending: Emergency Medicine | Admitting: Emergency Medicine

## 2023-07-25 ENCOUNTER — Other Ambulatory Visit: Payer: Self-pay

## 2023-07-25 DIAGNOSIS — K0889 Other specified disorders of teeth and supporting structures: Secondary | ICD-10-CM | POA: Diagnosis present

## 2023-07-25 DIAGNOSIS — Z992 Dependence on renal dialysis: Secondary | ICD-10-CM | POA: Insufficient documentation

## 2023-07-25 DIAGNOSIS — N186 End stage renal disease: Secondary | ICD-10-CM | POA: Diagnosis not present

## 2023-07-25 DIAGNOSIS — K047 Periapical abscess without sinus: Secondary | ICD-10-CM | POA: Insufficient documentation

## 2023-07-25 MED ORDER — AMOXICILLIN 500 MG PO CAPS
500.0000 mg | ORAL_CAPSULE | Freq: Three times a day (TID) | ORAL | 0 refills | Status: DC
  Filled 2023-07-25: qty 21, 7d supply, fill #0

## 2023-07-25 MED ORDER — AMOXICILLIN 500 MG PO CAPS
500.0000 mg | ORAL_CAPSULE | Freq: Every day | ORAL | 0 refills | Status: DC
  Filled 2023-07-25: qty 7, 7d supply, fill #0

## 2023-07-25 NOTE — ED Provider Notes (Signed)
 [place] EMERGENCY DEPARTMENT AT [facility] Provider Note   CSN: [id number] Arrival date & time: 07/25/23  3244     History  Chief Complaint  Patient presents with   Dental Problem    [name] is a 48 y.o. male.  Patient is a 48 year old male who presents with dental pain.  He said it started about 3 days ago.  He has had some pain and swelling around his left upper tooth.  No fevers.  No vomiting.  He has been seen at dental works before, last time being about 7 months ago.  Of note, he does have a history of end-stage renal disease and is on dialysis.       Home Medications Prior to Admission medications   Medication Sig Start Date End Date Taking? Authorizing Provider  AURYXIA 1 GM 210 MG(Fe) tablet Take 420 mg by mouth 3 (three) times daily with meals. 08/10/22  Yes [provider]  Multiple Vitamin (MULTIVITAMIN) tablet Take 1 tablet by mouth daily.   Yes [provider]  amoxicillin  (AMOXIL ) 500 MG capsule Take 1 capsule (500 mg total) by mouth daily. On dialysis days, take after dialysis 07/25/23   Michael Los, MD  gabapentin (NEURONTIN) 100 MG capsule Take 100 mg by mouth daily.    [provider]  isosorbide  dinitrate (ISORDIL ) 10 MG tablet Take 1 tablet (10 mg total) by mouth 2 (two) times daily. Patient not taking: No sig reported 02/04/20 03/24/20  Sigurd Driver, FNP      Allergies    Ivp dye [iodinated contrast media]    Review of Systems   Review of Systems  Constitutional:  Negative for fever.  HENT:  Positive for dental problem.   Gastrointestinal:  Negative for nausea and vomiting.  Neurological:  Positive for headaches (Radiating from his tooth.).    Physical Exam Updated Vital Signs BP 129/89 (BP Location: Right Arm)   Pulse 84   Temp 98.1 F (36.7 C) (Oral)   Resp 18   Ht 6\' 2"  (1.88 m)   Wt 113.4 kg   SpO2 100%   BMI 32.10 kg/m  Physical Exam HENT:     Mouth/Throat:     Comments: Positive  small amount of swelling and tenderness around his left upper first molar.  No trismus, uvula is midline Cardiovascular:     Rate and Rhythm: Normal rate.  Pulmonary:     Effort: Pulmonary effort is normal.  Neurological:     Mental Status: He is alert.     ED Results / Procedures / Treatments   Labs (all labs ordered are listed, but only abnormal results are displayed) Labs Reviewed - No data to display  EKG None  Radiology No results found.  Procedures Procedures    Medications Ordered in ED Medications - No data to display  ED Course/ Medical Decision Making/ A&P                                 Medical Decision Making Risk Prescription drug management.   Patient is a 48 year old who presents with tooth pain and swelling.  He has evidence of a small dental abscess.  He is otherwise well-appearing.  No signs of systemic infection.  He does not have any other concerning findings for deeper tissue infection such as peritonsillar abscess or Ludwig angina.  Will start on amoxicillin .  This was renally dosed for dialysis patient.  He was encouraged to have follow-up with his dentist.  Return precautions were given.  Final Clinical Impression(s) / ED Diagnoses Final diagnoses:  Dental abscess    Rx / DC Orders ED Discharge Orders          Ordered    amoxicillin  (AMOXIL ) 500 MG capsule  3 times daily,   Status:  Discontinued        07/25/23 0855    amoxicillin  (AMOXIL ) 500 MG capsule  Daily        07/25/23 0900              Michael Los, MD 07/25/23 [phone]

## 2023-07-25 NOTE — Discharge Instructions (Addendum)
 Follow-up with your dentist as discussed.  Return to the emergency room if you have any worsening symptoms.

## 2023-07-25 NOTE — ED Triage Notes (Addendum)
 In for eval of pain and swelling to left upper gums onset approx 3 days ago.

## 2023-07-26 DIAGNOSIS — D689 Coagulation defect, unspecified: Secondary | ICD-10-CM | POA: Diagnosis not present

## 2023-07-26 DIAGNOSIS — N186 End stage renal disease: Secondary | ICD-10-CM | POA: Diagnosis not present

## 2023-07-26 DIAGNOSIS — Z992 Dependence on renal dialysis: Secondary | ICD-10-CM | POA: Diagnosis not present

## 2023-07-26 DIAGNOSIS — N2581 Secondary hyperparathyroidism of renal origin: Secondary | ICD-10-CM | POA: Diagnosis not present

## 2023-07-29 DIAGNOSIS — N2581 Secondary hyperparathyroidism of renal origin: Secondary | ICD-10-CM | POA: Diagnosis not present

## 2023-07-29 DIAGNOSIS — N186 End stage renal disease: Secondary | ICD-10-CM | POA: Diagnosis not present

## 2023-07-29 DIAGNOSIS — D689 Coagulation defect, unspecified: Secondary | ICD-10-CM | POA: Diagnosis not present

## 2023-07-29 DIAGNOSIS — Z992 Dependence on renal dialysis: Secondary | ICD-10-CM | POA: Diagnosis not present

## 2023-07-30 DIAGNOSIS — N2581 Secondary hyperparathyroidism of renal origin: Secondary | ICD-10-CM | POA: Diagnosis not present

## 2023-07-30 DIAGNOSIS — Z992 Dependence on renal dialysis: Secondary | ICD-10-CM | POA: Diagnosis not present

## 2023-07-30 DIAGNOSIS — N186 End stage renal disease: Secondary | ICD-10-CM | POA: Diagnosis not present

## 2023-07-30 DIAGNOSIS — D689 Coagulation defect, unspecified: Secondary | ICD-10-CM | POA: Diagnosis not present

## 2023-08-01 DIAGNOSIS — N186 End stage renal disease: Secondary | ICD-10-CM | POA: Diagnosis not present

## 2023-08-01 DIAGNOSIS — Z992 Dependence on renal dialysis: Secondary | ICD-10-CM | POA: Diagnosis not present

## 2023-08-01 DIAGNOSIS — D689 Coagulation defect, unspecified: Secondary | ICD-10-CM | POA: Diagnosis not present

## 2023-08-01 DIAGNOSIS — N2581 Secondary hyperparathyroidism of renal origin: Secondary | ICD-10-CM | POA: Diagnosis not present

## 2023-08-05 DIAGNOSIS — N186 End stage renal disease: Secondary | ICD-10-CM | POA: Diagnosis not present

## 2023-08-05 DIAGNOSIS — D689 Coagulation defect, unspecified: Secondary | ICD-10-CM | POA: Diagnosis not present

## 2023-08-05 DIAGNOSIS — N2581 Secondary hyperparathyroidism of renal origin: Secondary | ICD-10-CM | POA: Diagnosis not present

## 2023-08-05 DIAGNOSIS — Z992 Dependence on renal dialysis: Secondary | ICD-10-CM | POA: Diagnosis not present

## 2023-08-07 DIAGNOSIS — Z992 Dependence on renal dialysis: Secondary | ICD-10-CM | POA: Diagnosis not present

## 2023-08-07 DIAGNOSIS — N186 End stage renal disease: Secondary | ICD-10-CM | POA: Diagnosis not present

## 2023-08-07 DIAGNOSIS — D689 Coagulation defect, unspecified: Secondary | ICD-10-CM | POA: Diagnosis not present

## 2023-08-07 DIAGNOSIS — N2581 Secondary hyperparathyroidism of renal origin: Secondary | ICD-10-CM | POA: Diagnosis not present

## 2023-08-08 ENCOUNTER — Ambulatory Visit: Admitting: Podiatry

## 2023-08-09 DIAGNOSIS — Z992 Dependence on renal dialysis: Secondary | ICD-10-CM | POA: Diagnosis not present

## 2023-08-09 DIAGNOSIS — D689 Coagulation defect, unspecified: Secondary | ICD-10-CM | POA: Diagnosis not present

## 2023-08-09 DIAGNOSIS — N186 End stage renal disease: Secondary | ICD-10-CM | POA: Diagnosis not present

## 2023-08-09 DIAGNOSIS — N2581 Secondary hyperparathyroidism of renal origin: Secondary | ICD-10-CM | POA: Diagnosis not present

## 2023-08-12 DIAGNOSIS — Z992 Dependence on renal dialysis: Secondary | ICD-10-CM | POA: Diagnosis not present

## 2023-08-12 DIAGNOSIS — D689 Coagulation defect, unspecified: Secondary | ICD-10-CM | POA: Diagnosis not present

## 2023-08-12 DIAGNOSIS — N186 End stage renal disease: Secondary | ICD-10-CM | POA: Diagnosis not present

## 2023-08-12 DIAGNOSIS — N2581 Secondary hyperparathyroidism of renal origin: Secondary | ICD-10-CM | POA: Diagnosis not present

## 2023-08-14 DIAGNOSIS — N186 End stage renal disease: Secondary | ICD-10-CM | POA: Diagnosis not present

## 2023-08-14 DIAGNOSIS — Z992 Dependence on renal dialysis: Secondary | ICD-10-CM | POA: Diagnosis not present

## 2023-08-14 DIAGNOSIS — D689 Coagulation defect, unspecified: Secondary | ICD-10-CM | POA: Diagnosis not present

## 2023-08-14 DIAGNOSIS — N2581 Secondary hyperparathyroidism of renal origin: Secondary | ICD-10-CM | POA: Diagnosis not present

## 2023-08-16 DIAGNOSIS — D689 Coagulation defect, unspecified: Secondary | ICD-10-CM | POA: Diagnosis not present

## 2023-08-16 DIAGNOSIS — N186 End stage renal disease: Secondary | ICD-10-CM | POA: Diagnosis not present

## 2023-08-16 DIAGNOSIS — N2581 Secondary hyperparathyroidism of renal origin: Secondary | ICD-10-CM | POA: Diagnosis not present

## 2023-08-16 DIAGNOSIS — Z992 Dependence on renal dialysis: Secondary | ICD-10-CM | POA: Diagnosis not present

## 2023-08-17 DIAGNOSIS — Z992 Dependence on renal dialysis: Secondary | ICD-10-CM | POA: Diagnosis not present

## 2023-08-17 DIAGNOSIS — N186 End stage renal disease: Secondary | ICD-10-CM | POA: Diagnosis not present

## 2023-08-19 DIAGNOSIS — D689 Coagulation defect, unspecified: Secondary | ICD-10-CM | POA: Diagnosis not present

## 2023-08-19 DIAGNOSIS — N186 End stage renal disease: Secondary | ICD-10-CM | POA: Diagnosis not present

## 2023-08-19 DIAGNOSIS — Z992 Dependence on renal dialysis: Secondary | ICD-10-CM | POA: Diagnosis not present

## 2023-08-19 DIAGNOSIS — N2581 Secondary hyperparathyroidism of renal origin: Secondary | ICD-10-CM | POA: Diagnosis not present

## 2023-08-20 ENCOUNTER — Ambulatory Visit (HOSPITAL_BASED_OUTPATIENT_CLINIC_OR_DEPARTMENT_OTHER): Admitting: Family Medicine

## 2023-08-21 DIAGNOSIS — D689 Coagulation defect, unspecified: Secondary | ICD-10-CM | POA: Diagnosis not present

## 2023-08-21 DIAGNOSIS — Z992 Dependence on renal dialysis: Secondary | ICD-10-CM | POA: Diagnosis not present

## 2023-08-21 DIAGNOSIS — N2581 Secondary hyperparathyroidism of renal origin: Secondary | ICD-10-CM | POA: Diagnosis not present

## 2023-08-21 DIAGNOSIS — N186 End stage renal disease: Secondary | ICD-10-CM | POA: Diagnosis not present

## 2023-08-23 DIAGNOSIS — Z992 Dependence on renal dialysis: Secondary | ICD-10-CM | POA: Diagnosis not present

## 2023-08-23 DIAGNOSIS — N186 End stage renal disease: Secondary | ICD-10-CM | POA: Diagnosis not present

## 2023-08-23 DIAGNOSIS — N2581 Secondary hyperparathyroidism of renal origin: Secondary | ICD-10-CM | POA: Diagnosis not present

## 2023-08-23 DIAGNOSIS — D689 Coagulation defect, unspecified: Secondary | ICD-10-CM | POA: Diagnosis not present

## 2023-08-26 DIAGNOSIS — Z992 Dependence on renal dialysis: Secondary | ICD-10-CM | POA: Diagnosis not present

## 2023-08-26 DIAGNOSIS — N186 End stage renal disease: Secondary | ICD-10-CM | POA: Diagnosis not present

## 2023-08-26 DIAGNOSIS — N2581 Secondary hyperparathyroidism of renal origin: Secondary | ICD-10-CM | POA: Diagnosis not present

## 2023-08-28 DIAGNOSIS — N2581 Secondary hyperparathyroidism of renal origin: Secondary | ICD-10-CM | POA: Diagnosis not present

## 2023-08-28 DIAGNOSIS — Z992 Dependence on renal dialysis: Secondary | ICD-10-CM | POA: Diagnosis not present

## 2023-08-28 DIAGNOSIS — N186 End stage renal disease: Secondary | ICD-10-CM | POA: Diagnosis not present

## 2023-08-30 DIAGNOSIS — D689 Coagulation defect, unspecified: Secondary | ICD-10-CM | POA: Diagnosis not present

## 2023-08-30 DIAGNOSIS — N2581 Secondary hyperparathyroidism of renal origin: Secondary | ICD-10-CM | POA: Diagnosis not present

## 2023-08-30 DIAGNOSIS — N186 End stage renal disease: Secondary | ICD-10-CM | POA: Diagnosis not present

## 2023-08-30 DIAGNOSIS — Z992 Dependence on renal dialysis: Secondary | ICD-10-CM | POA: Diagnosis not present

## 2023-09-02 DIAGNOSIS — D689 Coagulation defect, unspecified: Secondary | ICD-10-CM | POA: Diagnosis not present

## 2023-09-02 DIAGNOSIS — N186 End stage renal disease: Secondary | ICD-10-CM | POA: Diagnosis not present

## 2023-09-02 DIAGNOSIS — Z992 Dependence on renal dialysis: Secondary | ICD-10-CM | POA: Diagnosis not present

## 2023-09-02 DIAGNOSIS — N2581 Secondary hyperparathyroidism of renal origin: Secondary | ICD-10-CM | POA: Diagnosis not present

## 2023-09-04 DIAGNOSIS — N186 End stage renal disease: Secondary | ICD-10-CM | POA: Diagnosis not present

## 2023-09-04 DIAGNOSIS — Z992 Dependence on renal dialysis: Secondary | ICD-10-CM | POA: Diagnosis not present

## 2023-09-04 DIAGNOSIS — D689 Coagulation defect, unspecified: Secondary | ICD-10-CM | POA: Diagnosis not present

## 2023-09-04 DIAGNOSIS — N2581 Secondary hyperparathyroidism of renal origin: Secondary | ICD-10-CM | POA: Diagnosis not present

## 2023-09-06 DIAGNOSIS — D689 Coagulation defect, unspecified: Secondary | ICD-10-CM | POA: Diagnosis not present

## 2023-09-06 DIAGNOSIS — Z992 Dependence on renal dialysis: Secondary | ICD-10-CM | POA: Diagnosis not present

## 2023-09-06 DIAGNOSIS — N186 End stage renal disease: Secondary | ICD-10-CM | POA: Diagnosis not present

## 2023-09-06 DIAGNOSIS — N2581 Secondary hyperparathyroidism of renal origin: Secondary | ICD-10-CM | POA: Diagnosis not present

## 2023-09-09 DIAGNOSIS — N2581 Secondary hyperparathyroidism of renal origin: Secondary | ICD-10-CM | POA: Diagnosis not present

## 2023-09-09 DIAGNOSIS — Z992 Dependence on renal dialysis: Secondary | ICD-10-CM | POA: Diagnosis not present

## 2023-09-09 DIAGNOSIS — N186 End stage renal disease: Secondary | ICD-10-CM | POA: Diagnosis not present

## 2023-09-09 DIAGNOSIS — D689 Coagulation defect, unspecified: Secondary | ICD-10-CM | POA: Diagnosis not present

## 2023-09-11 DIAGNOSIS — N2581 Secondary hyperparathyroidism of renal origin: Secondary | ICD-10-CM | POA: Diagnosis not present

## 2023-09-11 DIAGNOSIS — N186 End stage renal disease: Secondary | ICD-10-CM | POA: Diagnosis not present

## 2023-09-11 DIAGNOSIS — D689 Coagulation defect, unspecified: Secondary | ICD-10-CM | POA: Diagnosis not present

## 2023-09-11 DIAGNOSIS — Z992 Dependence on renal dialysis: Secondary | ICD-10-CM | POA: Diagnosis not present

## 2023-09-13 DIAGNOSIS — Z992 Dependence on renal dialysis: Secondary | ICD-10-CM | POA: Diagnosis not present

## 2023-09-13 DIAGNOSIS — N2581 Secondary hyperparathyroidism of renal origin: Secondary | ICD-10-CM | POA: Diagnosis not present

## 2023-09-13 DIAGNOSIS — N186 End stage renal disease: Secondary | ICD-10-CM | POA: Diagnosis not present

## 2023-09-13 DIAGNOSIS — D689 Coagulation defect, unspecified: Secondary | ICD-10-CM | POA: Diagnosis not present

## 2023-09-16 DIAGNOSIS — D689 Coagulation defect, unspecified: Secondary | ICD-10-CM | POA: Diagnosis not present

## 2023-09-16 DIAGNOSIS — N2581 Secondary hyperparathyroidism of renal origin: Secondary | ICD-10-CM | POA: Diagnosis not present

## 2023-09-16 DIAGNOSIS — N186 End stage renal disease: Secondary | ICD-10-CM | POA: Diagnosis not present

## 2023-09-16 DIAGNOSIS — Z992 Dependence on renal dialysis: Secondary | ICD-10-CM | POA: Diagnosis not present

## 2023-09-18 DIAGNOSIS — N2581 Secondary hyperparathyroidism of renal origin: Secondary | ICD-10-CM | POA: Diagnosis not present

## 2023-09-18 DIAGNOSIS — Z992 Dependence on renal dialysis: Secondary | ICD-10-CM | POA: Diagnosis not present

## 2023-09-18 DIAGNOSIS — D689 Coagulation defect, unspecified: Secondary | ICD-10-CM | POA: Diagnosis not present

## 2023-09-18 DIAGNOSIS — N186 End stage renal disease: Secondary | ICD-10-CM | POA: Diagnosis not present

## 2023-09-20 DIAGNOSIS — D689 Coagulation defect, unspecified: Secondary | ICD-10-CM | POA: Diagnosis not present

## 2023-09-23 DIAGNOSIS — N186 End stage renal disease: Secondary | ICD-10-CM | POA: Diagnosis not present

## 2023-09-25 ENCOUNTER — Ambulatory Visit (HOSPITAL_BASED_OUTPATIENT_CLINIC_OR_DEPARTMENT_OTHER): Admitting: Family Medicine

## 2023-09-27 DIAGNOSIS — D689 Coagulation defect, unspecified: Secondary | ICD-10-CM | POA: Diagnosis not present

## 2023-09-27 DIAGNOSIS — N186 End stage renal disease: Secondary | ICD-10-CM | POA: Diagnosis not present

## 2023-09-27 DIAGNOSIS — Z992 Dependence on renal dialysis: Secondary | ICD-10-CM | POA: Diagnosis not present

## 2023-09-27 DIAGNOSIS — N2581 Secondary hyperparathyroidism of renal origin: Secondary | ICD-10-CM | POA: Diagnosis not present

## 2023-10-04 DIAGNOSIS — N186 End stage renal disease: Secondary | ICD-10-CM | POA: Diagnosis not present

## 2023-10-11 DIAGNOSIS — D689 Coagulation defect, unspecified: Secondary | ICD-10-CM | POA: Diagnosis not present

## 2023-10-11 DIAGNOSIS — Z992 Dependence on renal dialysis: Secondary | ICD-10-CM | POA: Diagnosis not present

## 2023-10-11 DIAGNOSIS — N2581 Secondary hyperparathyroidism of renal origin: Secondary | ICD-10-CM | POA: Diagnosis not present

## 2023-10-11 DIAGNOSIS — N186 End stage renal disease: Secondary | ICD-10-CM | POA: Diagnosis not present

## 2023-10-14 DIAGNOSIS — D689 Coagulation defect, unspecified: Secondary | ICD-10-CM | POA: Diagnosis not present

## 2023-10-14 DIAGNOSIS — N2581 Secondary hyperparathyroidism of renal origin: Secondary | ICD-10-CM | POA: Diagnosis not present

## 2023-10-14 DIAGNOSIS — Z992 Dependence on renal dialysis: Secondary | ICD-10-CM | POA: Diagnosis not present

## 2023-10-17 ENCOUNTER — Ambulatory Visit (HOSPITAL_BASED_OUTPATIENT_CLINIC_OR_DEPARTMENT_OTHER): Admitting: Family Medicine

## 2023-10-17 DIAGNOSIS — Z992 Dependence on renal dialysis: Secondary | ICD-10-CM | POA: Diagnosis not present

## 2023-10-17 DIAGNOSIS — I1311 Hypertensive heart and chronic kidney disease without heart failure, with stage 5 chronic kidney disease, or end stage renal disease: Secondary | ICD-10-CM | POA: Diagnosis not present

## 2023-10-17 DIAGNOSIS — N186 End stage renal disease: Secondary | ICD-10-CM | POA: Diagnosis not present

## 2023-10-18 DIAGNOSIS — Z992 Dependence on renal dialysis: Secondary | ICD-10-CM | POA: Diagnosis not present

## 2023-10-18 DIAGNOSIS — I1311 Hypertensive heart and chronic kidney disease without heart failure, with stage 5 chronic kidney disease, or end stage renal disease: Secondary | ICD-10-CM | POA: Diagnosis not present

## 2023-10-18 DIAGNOSIS — N186 End stage renal disease: Secondary | ICD-10-CM | POA: Diagnosis not present

## 2023-10-21 DIAGNOSIS — I1311 Hypertensive heart and chronic kidney disease without heart failure, with stage 5 chronic kidney disease, or end stage renal disease: Secondary | ICD-10-CM | POA: Diagnosis not present

## 2023-10-25 DIAGNOSIS — N2581 Secondary hyperparathyroidism of renal origin: Secondary | ICD-10-CM | POA: Diagnosis not present

## 2023-10-25 DIAGNOSIS — D689 Coagulation defect, unspecified: Secondary | ICD-10-CM | POA: Diagnosis not present

## 2023-10-25 DIAGNOSIS — N186 End stage renal disease: Secondary | ICD-10-CM | POA: Diagnosis not present

## 2023-10-25 DIAGNOSIS — Z992 Dependence on renal dialysis: Secondary | ICD-10-CM | POA: Diagnosis not present

## 2023-10-29 DIAGNOSIS — Z940 Kidney transplant status: Secondary | ICD-10-CM | POA: Diagnosis not present

## 2023-10-29 DIAGNOSIS — N186 End stage renal disease: Secondary | ICD-10-CM | POA: Diagnosis not present

## 2023-10-29 DIAGNOSIS — Z01818 Encounter for other preprocedural examination: Secondary | ICD-10-CM | POA: Diagnosis not present

## 2023-10-30 DIAGNOSIS — D689 Coagulation defect, unspecified: Secondary | ICD-10-CM | POA: Diagnosis not present

## 2023-11-06 DIAGNOSIS — N186 End stage renal disease: Secondary | ICD-10-CM | POA: Diagnosis not present

## 2023-11-08 DIAGNOSIS — N186 End stage renal disease: Secondary | ICD-10-CM | POA: Diagnosis not present

## 2023-11-13 ENCOUNTER — Other Ambulatory Visit: Payer: Self-pay

## 2023-11-13 ENCOUNTER — Encounter (HOSPITAL_COMMUNITY)
Admission: RE | Disposition: A | Discharge status: Home / Self Care | Payer: Self-pay | Source: Home / Self Care | Attending: Vascular Surgery

## 2023-11-13 ENCOUNTER — Ambulatory Visit (HOSPITAL_COMMUNITY)
Admission: RE | Admit: 2023-11-13 | Discharge: 2023-11-13 | Disposition: A | Discharge status: Home / Self Care | Attending: Vascular Surgery | Admitting: Vascular Surgery

## 2023-11-13 DIAGNOSIS — T82858A Stenosis of vascular prosthetic devices, implants and grafts, initial encounter: Secondary | ICD-10-CM | POA: Insufficient documentation

## 2023-11-13 DIAGNOSIS — N2581 Secondary hyperparathyroidism of renal origin: Secondary | ICD-10-CM | POA: Diagnosis not present

## 2023-11-13 DIAGNOSIS — Y832 Surgical operation with anastomosis, bypass or graft as the cause of abnormal reaction of the patient, or of later complication, without mention of misadventure at the time of the procedure: Secondary | ICD-10-CM | POA: Insufficient documentation

## 2023-11-13 DIAGNOSIS — N186 End stage renal disease: Secondary | ICD-10-CM | POA: Diagnosis not present

## 2023-11-13 DIAGNOSIS — Z992 Dependence on renal dialysis: Secondary | ICD-10-CM | POA: Diagnosis not present

## 2023-11-13 DIAGNOSIS — D689 Coagulation defect, unspecified: Secondary | ICD-10-CM | POA: Diagnosis not present

## 2023-11-13 SURGERY — A/V SHUNT INTERVENTION
Anesthesia: LOCAL | Site: Arm Upper | Laterality: Left

## 2023-11-13 MED ORDER — METHYLPREDNISOLONE SODIUM SUCC 125 MG IJ SOLR
INTRAMUSCULAR | Status: AC
  Filled 2023-11-13: qty 2

## 2023-11-13 MED ORDER — LIDOCAINE HCL (PF) 1 % IJ SOLN
INTRAMUSCULAR | Status: AC
Start: 2023-11-13 — End: 2023-11-13
  Filled 2023-11-13: qty 30

## 2023-11-13 MED ORDER — LIDOCAINE HCL (PF) 1 % IJ SOLN
INTRAMUSCULAR | Status: DC | PRN
  Administered 2023-11-13: 2 mL via SUBCUTANEOUS

## 2023-11-13 MED ORDER — DIPHENHYDRAMINE HCL 50 MG/ML IJ SOLN
INTRAMUSCULAR | Status: DC | PRN
  Administered 2023-11-13: 50 mg via INTRAVENOUS

## 2023-11-13 MED ORDER — DIPHENHYDRAMINE HCL 50 MG/ML IJ SOLN
INTRAMUSCULAR | Status: AC
Start: 2023-11-13 — End: 2023-11-13
  Filled 2023-11-13: qty 1

## 2023-11-13 MED ORDER — METHYLPREDNISOLONE SODIUM SUCC 125 MG IJ SOLR
INTRAMUSCULAR | Status: DC | PRN
  Administered 2023-11-13: 125 mg via INTRAVENOUS

## 2023-11-13 MED ORDER — IODIXANOL 320 MG/ML IV SOLN
INTRAVENOUS | Status: DC | PRN
  Administered 2023-11-13: 25 mL via INTRAVENOUS

## 2023-11-13 MED ORDER — HEPARIN (PORCINE) IN NACL 1000-0.9 UT/500ML-% IV SOLN
INTRAVENOUS | Status: DC | PRN
  Administered 2023-11-13: 500 mL

## 2023-11-13 SURGICAL SUPPLY — 9 items
BALLOON MUSTANG 7X60X75 (BALLOONS) IMPLANT
GUIDEWIRE ANGLED .035X150CM (WIRE) IMPLANT
KIT ENCORE 26 ADVANTAGE (KITS) IMPLANT
KIT MICROPUNCTURE NIT STIFF (SHEATH) IMPLANT
SHEATH PINNACLE R/O II 6F 4CM (SHEATH) IMPLANT
SHEATH PROBE COVER 6X72 (BAG) IMPLANT
STOPCOCK MORSE 400PSI 3WAY (MISCELLANEOUS) IMPLANT
TRAY PV CATH (CUSTOM PROCEDURE TRAY) ×2 IMPLANT
TUBING CIL FLEX 10 FLL-RA (TUBING) IMPLANT

## 2023-11-13 NOTE — H&P (Signed)
 [facility] [place] PROGRESS NOTE  ASSESSMENT / PLAN: Michael [name] is a 48 y.o. male with ESRD on HD via LUE AVG. The graft is pulsatile and he has had prolonged bleeding after decannulation.  Plan fistulogram today for evaluation and management.  SUBJECTIVE: Doing well overall.  Reports left arm AV graft is working at dialysis, but he is suffering from prolonged bleeding after decannulation.  OBJECTIVE: BP 109/74   Pulse 73   Temp 98.4 F (36.9 C) (Oral)   Resp 14   SpO2 96%  No distress Regular rate and rhythm Unlabored breathing Left arm AV graft with mildly pulsatile thrill     Latest Ref Rng & Units 10/10/2022   12:00 AM 10/05/2022   12:00 AM 09/27/2022   12:00 AM  CBC  WBC  7.7       7.7     7.7       Hemoglobin 13.5 - 17.5 12.3     12.3     12.2      Hematocrit 41 - 53  40       Platelets 150 - 400 K/uL 273     273          This result is from an external source.        Latest Ref Rng & Units 10/10/2022   12:00 AM 10/05/2022   12:00 AM 06/28/2022    7:32 AM  CMP  Glucose 70 - 99 mg/dL   860   BUN 6 - 20 mg/dL   40   Creatinine 0.6 - 1.3 15.8     15.8     13.80   Sodium 137 - 147 139      138   Potassium 3.5 - 5.1 mEq/L 5.2     5.2     5.6   Chloride 98 - 111 mmol/L   98   Calcium  8.7 - 10.7 8.4           This result is from an external source.    Debby SAILOR. Magda, MD [facility] Phone Number: [phone] 11/13/2023 3:24 PM

## 2023-11-13 NOTE — Op Note (Signed)
 DATE OF SERVICE: 11/13/2023  PATIENT:  Michael [name]  48 y.o. male  PRE-OPERATIVE DIAGNOSIS:  end-stage renal disease  POST-OPERATIVE DIAGNOSIS:  Same  PROCEDURE:   1) Ultrasound guided left arm AVG access (CPT [phone]) 2) fistulagram with peripheral angioplasty (CPT [phone]) 3) established outpatient evaluation and management - level 3 (CPT 99213)  SURGEON:  Debby SAILOR. Magda, MD  ASSISTANT: none  ANESTHESIA:   local and IV sedation  ESTIMATED BLOOD LOSS: min  LOCAL MEDICATIONS USED:  LIDOCAINE    COUNTS: confirmed correct.  PATIENT DISPOSITION:  PACU - hemodynamically stable.   Delay start of Pharmacological VTE agent (>24hrs) due to surgical blood loss or risk of bleeding: no  INDICATION FOR PROCEDURE: Michael [name] is a 48 y.o. male with ESRD on HD via LUE AVG. After careful discussion of risks, benefits, and alternatives the patient was offered fistulagram. The patient understood and wished to proceed.  OPERATIVE FINDINGS:  Left Upper Extremity Central venous: No stenosis Subclavian vein: No stenosis Axillary vein: no stenosis Graft: Graft is sewn into vein that occludes about 2-3 cm beyond anastomosis. There is a large branch that connects the outflow to the axillary vein. There is some stenosis (60-70%) through this segment. No issues in the body of the graft or at the arterial anastomosis.  DESCRIPTION OF PROCEDURE: After identification of the patient in the pre-operative holding area, the patient was transferred to the operating room. The patient was positioned supine on the operating room table.  The left upper extremity was prepped and draped in standard fashion. A surgical pause was performed confirming correct patient, procedure, and operative location.  The left upper extremity was anesthetized with subcutaneous injection of 1% lidocaine  over the area of planned access. Using ultrasound guidance, the left arm dialysis access was accessed with micropuncture technique.   Fistulogram was performed in stations with the micro sheath.  See above for details.  The decision was made to intervene. The lesion was crossed with a glidewire. Access was upsized to 78F. The lesion was angioplastied with 7x54mm Mustang. 30% residual stenosis was noted. The graft had an improved thrill. I discussed next steps with the patient. I explained that stenting or open revision would be an option, but given these would limit further options, I encouraged him to wait and see if this intervention was enough to make his graft perform better. He was in agreement.  All endovascular equipment was removed.  A figure-of-eight stitch was applied to the exit site with good hemostasis.  Sterile bandage was applied.  Upon completion of the case instrument and sharps counts were confirmed correct. The patient was transferred to the PACU in good condition. I was present for all portions of the procedure.  PLAN:  discussed next steps with the patient. I explained that stenting or open revision would be an option, but given these would limit further options, I encouraged him to wait and see if this intervention was enough to make his graft perform better. He was in agreement. Graft remains amenable to future intervention. Outflow stenting or open revision could be considered if he continues to have issues.   Debby SAILOR. Magda, MD [facility] Phone Number: [phone] 11/13/2023 3:28 PM

## 2023-11-14 ENCOUNTER — Encounter (HOSPITAL_COMMUNITY): Payer: Self-pay | Admitting: Vascular Surgery

## 2023-11-15 DIAGNOSIS — N186 End stage renal disease: Secondary | ICD-10-CM | POA: Diagnosis not present

## 2023-11-15 DIAGNOSIS — N2581 Secondary hyperparathyroidism of renal origin: Secondary | ICD-10-CM | POA: Diagnosis not present

## 2023-11-15 DIAGNOSIS — Z992 Dependence on renal dialysis: Secondary | ICD-10-CM | POA: Diagnosis not present

## 2023-11-17 DIAGNOSIS — Z992 Dependence on renal dialysis: Secondary | ICD-10-CM | POA: Diagnosis not present

## 2023-11-17 DIAGNOSIS — N186 End stage renal disease: Secondary | ICD-10-CM | POA: Diagnosis not present

## 2023-11-18 DIAGNOSIS — T7849XA Other allergy, initial encounter: Secondary | ICD-10-CM | POA: Diagnosis not present

## 2023-11-18 DIAGNOSIS — L299 Pruritus, unspecified: Secondary | ICD-10-CM | POA: Diagnosis not present

## 2023-11-18 DIAGNOSIS — D689 Coagulation defect, unspecified: Secondary | ICD-10-CM | POA: Diagnosis not present

## 2023-11-18 DIAGNOSIS — N2581 Secondary hyperparathyroidism of renal origin: Secondary | ICD-10-CM | POA: Diagnosis not present

## 2023-11-18 DIAGNOSIS — N186 End stage renal disease: Secondary | ICD-10-CM | POA: Diagnosis not present

## 2023-11-18 DIAGNOSIS — Z992 Dependence on renal dialysis: Secondary | ICD-10-CM | POA: Diagnosis not present

## 2023-11-20 ENCOUNTER — Ambulatory Visit: Admitting: Podiatry

## 2023-11-20 DIAGNOSIS — N186 End stage renal disease: Secondary | ICD-10-CM | POA: Diagnosis not present

## 2023-11-20 DIAGNOSIS — D689 Coagulation defect, unspecified: Secondary | ICD-10-CM | POA: Diagnosis not present

## 2023-11-20 DIAGNOSIS — T7849XA Other allergy, initial encounter: Secondary | ICD-10-CM | POA: Diagnosis not present

## 2023-11-20 DIAGNOSIS — Z992 Dependence on renal dialysis: Secondary | ICD-10-CM | POA: Diagnosis not present

## 2023-11-20 DIAGNOSIS — N2581 Secondary hyperparathyroidism of renal origin: Secondary | ICD-10-CM | POA: Diagnosis not present

## 2023-11-22 DIAGNOSIS — D689 Coagulation defect, unspecified: Secondary | ICD-10-CM | POA: Diagnosis not present

## 2023-11-22 DIAGNOSIS — N2581 Secondary hyperparathyroidism of renal origin: Secondary | ICD-10-CM | POA: Diagnosis not present

## 2023-11-22 DIAGNOSIS — Z992 Dependence on renal dialysis: Secondary | ICD-10-CM | POA: Diagnosis not present

## 2023-11-22 DIAGNOSIS — T7849XA Other allergy, initial encounter: Secondary | ICD-10-CM | POA: Diagnosis not present

## 2023-11-22 DIAGNOSIS — L299 Pruritus, unspecified: Secondary | ICD-10-CM | POA: Diagnosis not present

## 2023-11-22 DIAGNOSIS — N186 End stage renal disease: Secondary | ICD-10-CM | POA: Diagnosis not present

## 2023-11-25 DIAGNOSIS — D689 Coagulation defect, unspecified: Secondary | ICD-10-CM | POA: Diagnosis not present

## 2023-11-25 DIAGNOSIS — N2581 Secondary hyperparathyroidism of renal origin: Secondary | ICD-10-CM | POA: Diagnosis not present

## 2023-11-25 DIAGNOSIS — Z992 Dependence on renal dialysis: Secondary | ICD-10-CM | POA: Diagnosis not present

## 2023-11-25 DIAGNOSIS — L299 Pruritus, unspecified: Secondary | ICD-10-CM | POA: Diagnosis not present

## 2023-11-25 DIAGNOSIS — N186 End stage renal disease: Secondary | ICD-10-CM | POA: Diagnosis not present

## 2023-11-27 DIAGNOSIS — T7849XA Other allergy, initial encounter: Secondary | ICD-10-CM | POA: Diagnosis not present

## 2023-11-27 DIAGNOSIS — N186 End stage renal disease: Secondary | ICD-10-CM | POA: Diagnosis not present

## 2023-11-27 DIAGNOSIS — Z992 Dependence on renal dialysis: Secondary | ICD-10-CM | POA: Diagnosis not present

## 2023-11-27 DIAGNOSIS — D689 Coagulation defect, unspecified: Secondary | ICD-10-CM | POA: Diagnosis not present

## 2023-11-27 DIAGNOSIS — L299 Pruritus, unspecified: Secondary | ICD-10-CM | POA: Diagnosis not present

## 2023-12-04 ENCOUNTER — Ambulatory Visit: Admitting: Podiatry

## 2023-12-04 DIAGNOSIS — L299 Pruritus, unspecified: Secondary | ICD-10-CM | POA: Diagnosis not present

## 2023-12-04 DIAGNOSIS — D689 Coagulation defect, unspecified: Secondary | ICD-10-CM | POA: Diagnosis not present

## 2023-12-04 DIAGNOSIS — Z992 Dependence on renal dialysis: Secondary | ICD-10-CM | POA: Diagnosis not present

## 2023-12-04 DIAGNOSIS — N186 End stage renal disease: Secondary | ICD-10-CM | POA: Diagnosis not present

## 2023-12-04 DIAGNOSIS — N2581 Secondary hyperparathyroidism of renal origin: Secondary | ICD-10-CM | POA: Diagnosis not present

## 2023-12-11 DIAGNOSIS — D689 Coagulation defect, unspecified: Secondary | ICD-10-CM | POA: Diagnosis not present

## 2023-12-11 DIAGNOSIS — N186 End stage renal disease: Secondary | ICD-10-CM | POA: Diagnosis not present

## 2023-12-11 DIAGNOSIS — N2581 Secondary hyperparathyroidism of renal origin: Secondary | ICD-10-CM | POA: Diagnosis not present

## 2023-12-11 DIAGNOSIS — Z992 Dependence on renal dialysis: Secondary | ICD-10-CM | POA: Diagnosis not present

## 2023-12-11 DIAGNOSIS — L299 Pruritus, unspecified: Secondary | ICD-10-CM | POA: Diagnosis not present

## 2023-12-13 DIAGNOSIS — N2581 Secondary hyperparathyroidism of renal origin: Secondary | ICD-10-CM | POA: Diagnosis not present

## 2023-12-13 DIAGNOSIS — L299 Pruritus, unspecified: Secondary | ICD-10-CM | POA: Diagnosis not present

## 2023-12-13 DIAGNOSIS — N186 End stage renal disease: Secondary | ICD-10-CM | POA: Diagnosis not present

## 2023-12-13 DIAGNOSIS — Z992 Dependence on renal dialysis: Secondary | ICD-10-CM | POA: Diagnosis not present

## 2023-12-13 DIAGNOSIS — T7849XA Other allergy, initial encounter: Secondary | ICD-10-CM | POA: Diagnosis not present

## 2023-12-13 DIAGNOSIS — D689 Coagulation defect, unspecified: Secondary | ICD-10-CM | POA: Diagnosis not present

## 2023-12-16 ENCOUNTER — Emergency Department (HOSPITAL_BASED_OUTPATIENT_CLINIC_OR_DEPARTMENT_OTHER): Admission: EM | Admit: 2023-12-16 | Discharge: 2023-12-16 | Disposition: A | Discharge status: Home / Self Care

## 2023-12-16 ENCOUNTER — Emergency Department (HOSPITAL_BASED_OUTPATIENT_CLINIC_OR_DEPARTMENT_OTHER): Admitting: Radiology

## 2023-12-16 ENCOUNTER — Other Ambulatory Visit: Payer: Self-pay

## 2023-12-16 ENCOUNTER — Encounter (HOSPITAL_BASED_OUTPATIENT_CLINIC_OR_DEPARTMENT_OTHER): Payer: Self-pay | Admitting: Emergency Medicine

## 2023-12-16 DIAGNOSIS — N2581 Secondary hyperparathyroidism of renal origin: Secondary | ICD-10-CM | POA: Diagnosis not present

## 2023-12-16 DIAGNOSIS — R079 Chest pain, unspecified: Secondary | ICD-10-CM | POA: Diagnosis not present

## 2023-12-16 DIAGNOSIS — N186 End stage renal disease: Secondary | ICD-10-CM | POA: Diagnosis not present

## 2023-12-16 DIAGNOSIS — L299 Pruritus, unspecified: Secondary | ICD-10-CM | POA: Diagnosis not present

## 2023-12-16 DIAGNOSIS — Z992 Dependence on renal dialysis: Secondary | ICD-10-CM | POA: Diagnosis not present

## 2023-12-16 DIAGNOSIS — D689 Coagulation defect, unspecified: Secondary | ICD-10-CM | POA: Diagnosis not present

## 2023-12-16 DIAGNOSIS — R0789 Other chest pain: Secondary | ICD-10-CM | POA: Diagnosis not present

## 2023-12-16 LAB — CBC
HCT: 42.5 % (ref 39.0–52.0)
Hemoglobin: 13.8 g/dL (ref 13.0–17.0)
MCH: 29.2 pg (ref 26.0–34.0)
MCHC: 32.5 g/dL (ref 30.0–36.0)
MCV: 89.9 fL (ref 80.0–100.0)
Platelets: 263 K/uL (ref 150–400)
RBC: 4.73 MIL/uL (ref 4.22–5.81)
RDW: 15.4 % (ref 11.5–15.5)
WBC: 7.2 K/uL (ref 4.0–10.5)
nRBC: 0 % (ref 0.0–0.2)

## 2023-12-16 LAB — BASIC METABOLIC PANEL WITH GFR
Anion gap: 18 — ABNORMAL HIGH (ref 5–15)
BUN: 25 mg/dL — ABNORMAL HIGH (ref 6–20)
CO2: 27 mmol/L (ref 22–32)
Calcium: 8.9 mg/dL (ref 8.9–10.3)
Chloride: 95 mmol/L — ABNORMAL LOW (ref 98–111)
Creatinine, Ser: 7.99 mg/dL — ABNORMAL HIGH (ref 0.61–1.24)
GFR, Estimated: 8 mL/min — ABNORMAL LOW (ref >60)
Glucose, Bld: 88 mg/dL (ref 70–99)
Potassium: 3.9 mmol/L (ref 3.5–5.1)
Sodium: 140 mmol/L (ref 135–145)

## 2023-12-16 LAB — TROPONIN T, HIGH SENSITIVITY
Troponin T High Sensitivity: 63 ng/L — ABNORMAL HIGH (ref 0–19)
Troponin T High Sensitivity: 54 ng/L — ABNORMAL HIGH (ref 0–19)

## 2023-12-16 NOTE — ED Provider Notes (Signed)
 [place] EMERGENCY DEPARTMENT AT [facility] Provider Note   CSN: [id number] Arrival date & time: 12/16/23  1056     Patient presents with: Chest Pain   Michael [name] is a 48 y.o. male.   This is a 48 year old male presenting emergency department with chest pain.  He notes symptoms since Friday after he woke up.  Thinks he slept on his chest wrong.  Hurts worse with movement.  Better with sitting still.  Notes that he has felt better after dialysis Friday and then today.  No associated shortness of breath.  No trauma.  No URI symptoms, no shortness of breath.  No palpitations.  No lightheadedness.  He notes that chest pain is essentially resolved currently and not having symptoms   Chest Pain      Prior to Admission medications   Medication Sig Start Date End Date Taking? Authorizing Provider  amoxicillin  (AMOXIL ) 500 MG capsule Take 1 capsule (500 mg total) by mouth daily. On dialysis days, take after dialysis 07/25/23   Lenor Hollering, MD  AURYXIA 1 GM 210 MG(Fe) tablet Take 420 mg by mouth 3 (three) times daily with meals. 08/10/22   [provider]  gabapentin (NEURONTIN) 100 MG capsule Take 100 mg by mouth daily.    [provider]  Multiple Vitamin (MULTIVITAMIN) tablet Take 1 tablet by mouth daily.    [provider]  isosorbide  dinitrate (ISORDIL ) 10 MG tablet Take 1 tablet (10 mg total) by mouth 2 (two) times daily. Patient not taking: No sig reported 02/04/20 03/24/20  Tilford Bertram HERO, FNP    Allergies: Ivp dye [iodinated contrast media]    Review of Systems  Cardiovascular:  Positive for chest pain.    Updated Vital Signs BP (!) 120/91 (BP Location: Left Arm)   Pulse 79   Temp 98.2 F (36.8 C) (Oral)   Resp 18   SpO2 99%   Physical Exam Vitals and nursing note reviewed.  Constitutional:      General: He is not in acute distress.    Appearance: He is not toxic-appearing.  HENT:     Head: Normocephalic.  Cardiovascular:      Rate and Rhythm: Normal rate and regular rhythm.     Pulses:          Radial pulses are 2+ on the right side and 2+ on the left side.     Heart sounds: Normal heart sounds.     Comments: Left upper extremity fistula in place.  Palpable thrill. Pulmonary:     Effort: Pulmonary effort is normal.     Breath sounds: Normal breath sounds.  Chest:     Chest wall: No tenderness.  Abdominal:     Palpations: Abdomen is soft.  Musculoskeletal:     Right lower leg: No edema.     Left lower leg: No edema.  Skin:    General: Skin is warm.     Capillary Refill: Capillary refill takes less than 2 seconds.  Neurological:     General: No focal deficit present.  Psychiatric:        Mood and Affect: Mood normal.        Behavior: Behavior normal.     (all labs ordered are listed, but only abnormal results are displayed) Labs Reviewed  BASIC METABOLIC PANEL WITH GFR - Abnormal; Notable for the following components:      Result Value   Chloride 95 (*)    BUN 25 (*)    Creatinine, Ser  7.99 (*)    GFR, Estimated 8 (*)    Anion gap 18 (*)    All other components within normal limits  TROPONIN T, HIGH SENSITIVITY - Abnormal; Notable for the following components:   Troponin T High Sensitivity 63 (*)    All other components within normal limits  TROPONIN T, HIGH SENSITIVITY - Abnormal; Notable for the following components:   Troponin T High Sensitivity 54 (*)    All other components within normal limits  CBC    EKG: EKG Interpretation Date/Time:  Monday December 16 2023 11:13:55 EDT Ventricular Rate:  79 PR Interval:  152 QRS Duration:  80 QT Interval:  402 QTC Calculation: 460 R Axis:   14  Text Interpretation: Normal sinus rhythm Normal ECG When compared with ECG of 24-Aug-2021 07:42, T wave amplitude has decreased in Anterior leads Confirmed by Neysa Clap [phone]) on 12/16/2023 2:59:50 PM  Radiology: DG Chest 2 View Result Date: 12/16/2023 CLINICAL DATA:  Left-sided chest  pain for several days. EXAM: CHEST - 2 VIEW COMPARISON:  09/13/2022 FINDINGS: The heart size and mediastinal contours are within normal limits. Both lungs are clear. Vascular stent again seen left axillary region. IMPRESSION: No active cardiopulmonary disease. Electronically Signed   By: Norleen DELENA Kil M.D.   On: 12/16/2023 11:53     Procedures   Medications Ordered in the ED - No data to display  Clinical Course as of 12/16/23 1705  Mon Dec 16, 2023  1510 Heart cath in 2021 per my chart review: Left Heart Catheterization 12/09/19:  Normal coronary arteries, tortuous vessels suggestive of hypertension with hypertensive heart disease.  Hyperdynamic LVEF at 70%.  Moderately elevated LVEDP.  30 mL contrast utilized.  Right femoral arterial access closed with Perclose.   Recommendation: Evaluation for noncardiac chest pain, chest pain probably related to hypertension and also musculoskeletal chest pain.  Abnormal EKG related to hypertension.  [TY]  1510 Troponin T High Sensitivity(!): 63 2/2 ESRD? Awaiting delta trop. ECG with no ischemic changes.  [TY]  1510 Basic metabolic panel(!) C/w ESRD on dialysis. Potassium normal.  [TY]  1511 CBC No evidence of infection. No anemia.  [TY]  1511 DG Chest 2 View IMPRESSION: No active cardiopulmonary disease.   Electronically Signed   By: Norleen DELENA Kil M.D.   On: 12/16/2023 11:53   [TY]  1549 Troponin T High Sensitivity(!): 63 Had prior elevations in troponin I [TY]    Clinical Course User Index [TY] Neysa Clap PARAS, DO                                 Medical Decision Making This is a 48 year old male presenting emergency department for chest pain.  He is afebrile nontachycardic, slightly hypertensive.  With a complex past medical history to include ESRD on dialysis, prior intracranial aneurysm currently undergoing evaluation for possible kidney transplant.  His EKG appears similar to prior with no ischemic changes.  Mild elevation in his  initial troponin, downtrending.  He is chest pain-free currently.  Chest x-ray without pneumonia or pneumothorax.  Labs otherwise as noted in ED course reassuring.  Low suspicion for PE.  Wells/PERC negative.  I do not see an emergent cause for his chest pain.  Also, asymptomatic currently.  Feel that further workup can be deferred outpatient with his primary doctors.  Will discharge in stable condition.  Amount and/or Complexity of Data Reviewed External Data Reviewed:  Details: See ED course.  Had normal cath in 2021 Labs: ordered. Decision-making details documented in ED Course. Radiology: ordered and independent interpretation performed. Decision-making details documented in ED Course. ECG/medicine tests: independent interpretation performed.    Details: See above  Risk Decision regarding hospitalization. Diagnosis or treatment significantly limited by social determinants of health. Risk Details: Poor health literacy       Final diagnoses:  None    ED Discharge Orders     None          Neysa Caron PARAS, DO 12/16/23 1705

## 2023-12-16 NOTE — ED Notes (Signed)
 Pt ambulated independently to the bathroom

## 2023-12-16 NOTE — Discharge Instructions (Signed)
 Please follow-up with your primary doctor.  Return if no fevers, chills, lightheadedness, passout, palpitations, chest pain returns and worsens, shortness of breath, or he develop any new or worsening symptoms that are concerning to you.

## 2023-12-16 NOTE — ED Notes (Signed)
 ED Provider at bedside.

## 2023-12-16 NOTE — ED Triage Notes (Signed)
 Pt arrives with L chest pain that started Friday. States he ate unhealthy this past week/weekend but chest pain hasn't resolved. Also states feels like he slept on it wrong and c/o soreness.  Hx: dialysis pt (takes renal medication and causes diarrhea)

## 2023-12-17 DIAGNOSIS — N186 End stage renal disease: Secondary | ICD-10-CM | POA: Diagnosis not present

## 2023-12-17 DIAGNOSIS — Z992 Dependence on renal dialysis: Secondary | ICD-10-CM | POA: Diagnosis not present

## 2023-12-17 NOTE — Progress Notes (Signed)
 Negative stress test

## 2023-12-17 NOTE — Progress Notes (Signed)
 [facility]  [place]  [facility] Transplant Infectious Diseases Consultation  Reason for Referral: pre-transplant evaluation Referring Clinician: Emery Garnell Cranston Sula, MD   History of Present Illness:  [name] is a 48 year old man with a history of ESRD due to PCKD, on HD since 2021 via a LUE AVG. He is referred today for pre-transplant consultation. He feels well and has no acute concerns.  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  Problem List[1]  Allergies[2]  Current Medications[3]   Family History[4]  Social History[5]   Physical Examination:  VITALS: BP 148/86   Pulse 92   Temp 97.6 F (36.4 C)   Wt 114 kg (251 lb)   SpO2 100%   BMI 32.23 kg/m , reviewed GENERAL: Alert, oriented, well-appearing and in no acute distress HEAD AND NECK: No thrush or oral mucositis. No cervical or supraclavicular lymphadenopathy. EYES: EOMI, PERRL. No scleral icterus or injection.  CHEST: Clear to auscultation bilaterally; no audible wheezes, rales, or rhonchi. CARDIAC: Regular rhythm without audible murmurs. ABDOMEN: Soft and nontender with audible bowel tones. No palpable masses or organomegaly. EXTREMITIES: No  peripheral edema; no synovitis or deformity. LUE AVG nontender with palpable thrill. SKIN: No rash, no jaundice. NEUROLOGIC: Alert and oriented to time, person, place. Speech fluent and appropriate. Moves all extremities, normal strength and distal sensation. No facial droop or tremor. GENITOURINARY: No suprapubic or CVA tenderness.    Diagnostic Studies: Laboratory studies, culture data, and diagnostic imaging reviewed in detail by myself. Pertinent findings are as follows.    10/29/23 15:08  Creatinine 12.69 (H)  eGFR 4 (L)  Alanine Aminotransferase 12  Hemoglobin A1c 5.3  WBC 9.70  Hemoglobin 13.0 (L)  Platelet Count (Plt) 265  Cytomegalovirus (CMV) Antibody, IgG Positive !  Cytomegalovirus (CMV) Antibody, IgM Negative  Epstein Barr Virus Nuclear Antigen Antibodies, IgG >600.0 (H)  Epstein Barr Virus VCA Antibody, IgG >600.0 (H)  Epstein Barr Virus VCA Antibody, IgM <36.0  Hepatitis A Virus Antibody, Total Reactive !  Hepatitis A IgM Antibody Non-Reactive  Hepatitis B Core IgM Antibody Non-Reactive  Hepatitis B Surface Ag Non-Reactive  Hep C Virus Ab Non-Reactive  Hepatitis B Surface Antibody, Qualitative REACTIVE (EQUIVALENT TO >=10 mIU/mL) !  HIV Ag/Ab Screen Non-Reactive  RPR Non-Reactive  Strongyloides IgG Antibody Positive !  Toxoplasma gondii Antibodies, IgG <3.0  Toxoplasma gondii Antibodies, IgM <3.0  Varicella Zoster Antibody, IgG Positive  Quantiferon Negative   Imaging reviewed, no acute findings.  No pertinent positive culture data.  Assessment and Recommendations:  Encounter Diagnoses  Name Primary?  . Pre-transplant evaluation for kidney transplant Yes  . ESRD on hemodialysis (HCC)   . Polycystic kidney disease   . Encounter for immunization   . Strongyloidiasis     Infectious Diseases Pre-Transplant Evaluation  Viral HIV-1/2: negative HAV: positive HBV: HBsAg: non-reactive, HBsAb: reactive,  HBcAb: non-reactive .  HCV: HCV Antibody: negative   CMV: CMV IgG - positive : IgM - negative  EBV: EBV Serologies: consistent with prior infection VZV: VZV IgG - positive . MMR: not ordered, will check MMR serologies today.   Bacterial Syphilis: negative  Tuberculosis:  Risk factors: no known exposures to persons with active tuberculosis, no contact with the correctional system, never experienced homelessness Chest radiography: no acute cardiopulmonary process, no cavitary lesions  TB IGRA: negative   Fungal Histoplasmosis: resided in endemic region - at risk, no therapy needed Blastomycosis: no pre-transplant testing indicated Coccidioidomycosis: prior exposure to an endemic region, screening indicated Cryptococcosis: no pre-transplant testing indicated Other (e.g., Talaromyces marneffei): no pre-transplant testing indicated  Parasitic Strongyloides: positive - will treat with ivermectin 15 mg PO daily x 2 days. Schistosomiasis: no exposure to an endemic region, no screening indicated  Chagas: no exposure to an endemic region, no screening indicated Toxoplasmosis: negative - will be on TMP-SMX prophylaxis after transplant  5. Immunizations. Vaccine records reviewed in detail. Pneumococcal: PCV13 and PPSV23 received, current Tdap: needs, will provide in clinic today MMR: not immediately indicated - will check serology and consider revaccination if negative HAV: immune HBV: immune Varicella: immune Zoster: needs, will provide in clinic today HPV: not immediately indicated Influenza: needs, will provide in clinic today COVID-19: previously vaccinated, recommend updated vaccine dose when available RSV: not immediately indicated  6. Antibiotic allergies. Allergy records reviewed in detail. Beta-lactams: no reported allergy Sulfonamides: no reported allergy Other antimicrobials: no reported allergy Plan: routine antimicrobial prophylaxis following transplant  I recommend getting his second dose of Shingrix in 2 months; he can  get this done as a nurse visit in our clinic or at a pharmacy near his home. If his MMR serology is negative, I will recommend revaccination. He should get a seasonal COVID-19 vaccine as well. However, he is overall in a good place regarding transplant from an ID standpoint, and routine ID follow-up is not mandatory. He is welcome to return to my clinic anytime with any future questions or concerns.  I have personally spent 45 minutes involved in face-to-face and non-face-to-face activities for this patient on the day of the visit.  Professional time spent includes the following activities, in addition to those noted in the documentation: preparing to see the patient (review of tests), obtaining and/or reviewing separately obtained history (admission/discharge record), performing a medically appropriate examination and/or evaluation, ordering medications/tests/procedures, referring and communicating with other health care professionals, documenting clinical information in the EMR or other health record, Independently interpreting results (not separately reported), communicating results to the patient/family/caregiver, counseling and educating the patient/family/caregiver and care coordination (not separately reported).  Electronically signed by: Bernardino MOTE Verne, MD, GENI KAPPA Professor of Medicine Section of Infectious Diseases 12/17/2023 9:21 AM       [1] Patient Active Problem List Diagnosis  . Polycystic kidney disease  . ESRD on hemodialysis (HCC)  . Hypertensive crisis  . Adjustment disorder with mixed anxiety and depressed mood  . Acute gouty arthritis  . Observed sleep apnea  . Anemia due to chronic kidney disease, on chronic dialysis (HCC)  . Cerebral aneurysm  . Renal failure  . Adult polycystic kidney disease  . Body mass index (BMI) 34.0-34.9, adult  . Chest pain  . Chronic gout due to renal impairment without tophus  . Elevated blood-pressure reading, without diagnosis of  hypertension  . Elevated troponin  . Excessive daytime sleepiness  . Hematochezia  . Hematuria  .  Hyperkalemia  . Insomnia  . Metabolic acidosis, increased anion gap  . Mixed hyperlipidemia  . Normocytic anemia  . Primary snoring  . SDH (subdural hematoma) (CMD)  . SIRS (systemic inflammatory response syndrome)    (CMD)  . Uremia  . Adenomatous polyp of colon  [2] Allergies Allergen Reactions  . Iodinated Contrast Media Itching  . Shellfish Containing Products Cough  [3] Current Outpatient Medications  Medication Sig Dispense Refill  . allopurinoL  (ZYLOPRIM ) 100 mg tablet Take 100 mg by mouth daily as needed. (Patient not taking: Reported on 10/29/2023)    . aspirin  81 mg EC tablet Resume medication on 01/07/21 after you have completed 2 weeks of Eliquis. (Patient not taking: Reported on 10/29/2023)    . Auryxia 210 mg iron tab tablet     . calcium  acetate (PHOSLO ) 667 mg (169 mg calcium ) capsule Take 2,001 mg by mouth 3 (three) times a day with meals.    . cyanocobalamin-levomefolate calcium -pyridoxine (Foltx) 2-1.13-25 mg tab tablet Take 1 tablet by mouth daily. (Patient not taking: Reported on 10/29/2023) 90 tablet 3  . diphenhydrAMINE  (BENADRYL ) 25 mg tablet Take 25 mg by mouth as needed for allergies.    . lidocaine -prilocaine  (EMLA ) cream Apply 1 Application topically as needed.    . multivit-min-folic acid -vit K-lycop (One-A-Day Men's Multivitamin) 400-20-300 mcg tab Take 1 tablet by mouth Once Daily.    . sennosides-docusate sodium  (PERICOLACE) 8.6-50 mg per tablet Take 2 tablets by mouth nightly. (Patient not taking: Reported on 10/29/2023)     No current facility-administered medications for this visit.  [4] No family history on file. [5] Social History Tobacco Use  . Smoking status: Former    Current packs/day: 0.00    Types: Cigarettes    Quit date: 11/09/2012    Years since quitting: 11.1  . Smokeless tobacco: Never  . Tobacco comments:    1 - 2 cigarettes in a  day  Substance Use Topics  . Alcohol use: Yes    Alcohol/week: 1.0 standard drink of alcohol  . Drug use: No

## 2024-01-31 ENCOUNTER — Encounter (HOSPITAL_COMMUNITY): Payer: Self-pay | Admitting: Emergency Medicine

## 2024-01-31 ENCOUNTER — Emergency Department (HOSPITAL_COMMUNITY)

## 2024-01-31 ENCOUNTER — Observation Stay (HOSPITAL_COMMUNITY)
Admission: EM | Admit: 2024-01-31 | Discharge: 2024-02-02 | Disposition: A | Discharge status: Home / Self Care | Attending: Emergency Medicine | Admitting: Emergency Medicine

## 2024-01-31 ENCOUNTER — Other Ambulatory Visit: Payer: Self-pay

## 2024-01-31 DIAGNOSIS — R079 Chest pain, unspecified: Secondary | ICD-10-CM | POA: Insufficient documentation

## 2024-01-31 DIAGNOSIS — I959 Hypotension, unspecified: Secondary | ICD-10-CM | POA: Diagnosis not present

## 2024-01-31 DIAGNOSIS — Z992 Dependence on renal dialysis: Secondary | ICD-10-CM | POA: Diagnosis not present

## 2024-01-31 DIAGNOSIS — Q613 Polycystic kidney, unspecified: Secondary | ICD-10-CM | POA: Diagnosis not present

## 2024-01-31 DIAGNOSIS — D631 Anemia in chronic kidney disease: Secondary | ICD-10-CM | POA: Diagnosis not present

## 2024-01-31 DIAGNOSIS — K529 Noninfective gastroenteritis and colitis, unspecified: Secondary | ICD-10-CM | POA: Insufficient documentation

## 2024-01-31 DIAGNOSIS — N186 End stage renal disease: Secondary | ICD-10-CM | POA: Insufficient documentation

## 2024-01-31 DIAGNOSIS — E8729 Other acidosis: Secondary | ICD-10-CM

## 2024-01-31 DIAGNOSIS — R197 Diarrhea, unspecified: Secondary | ICD-10-CM | POA: Diagnosis present

## 2024-01-31 DIAGNOSIS — A041 Enterotoxigenic Escherichia coli infection: Secondary | ICD-10-CM | POA: Diagnosis not present

## 2024-01-31 DIAGNOSIS — R112 Nausea with vomiting, unspecified: Secondary | ICD-10-CM

## 2024-01-31 DIAGNOSIS — R42 Dizziness and giddiness: Secondary | ICD-10-CM | POA: Diagnosis present

## 2024-01-31 DIAGNOSIS — E860 Dehydration: Secondary | ICD-10-CM | POA: Diagnosis not present

## 2024-01-31 DIAGNOSIS — E875 Hyperkalemia: Secondary | ICD-10-CM | POA: Insufficient documentation

## 2024-01-31 DIAGNOSIS — Q612 Polycystic kidney, adult type: Secondary | ICD-10-CM | POA: Insufficient documentation

## 2024-01-31 LAB — CBC WITH DIFFERENTIAL/PLATELET
Abs Immature Granulocytes: 0.09 K/uL — ABNORMAL HIGH (ref 0.00–0.07)
Basophils Absolute: 0.1 K/uL (ref 0.0–0.1)
Basophils Relative: 0 %
Eosinophils Absolute: 0.5 K/uL (ref 0.0–0.5)
Eosinophils Relative: 4 %
HCT: 52.6 % — ABNORMAL HIGH (ref 39.0–52.0)
Hemoglobin: 17.2 g/dL — ABNORMAL HIGH (ref 13.0–17.0)
Immature Granulocytes: 1 %
Lymphocytes Relative: 25 %
Lymphs Abs: 3.3 K/uL (ref 0.7–4.0)
MCH: 29.4 pg (ref 26.0–34.0)
MCHC: 32.7 g/dL (ref 30.0–36.0)
MCV: 89.9 fL (ref 80.0–100.0)
Monocytes Absolute: 1.2 K/uL — ABNORMAL HIGH (ref 0.1–1.0)
Monocytes Relative: 9 %
Neutro Abs: 7.7 K/uL (ref 1.7–7.7)
Neutrophils Relative %: 61 %
Platelets: 288 K/uL (ref 150–400)
RBC: 5.85 MIL/uL — ABNORMAL HIGH (ref 4.22–5.81)
RDW: 15.7 % — ABNORMAL HIGH (ref 11.5–15.5)
WBC: 12.9 K/uL — ABNORMAL HIGH (ref 4.0–10.5)
nRBC: 0.2 % (ref 0.0–0.2)

## 2024-01-31 LAB — BASIC METABOLIC PANEL WITH GFR
Anion gap: 23 — ABNORMAL HIGH (ref 5–15)
BUN: 89 mg/dL — ABNORMAL HIGH (ref 6–20)
CO2: 16 mmol/L — ABNORMAL LOW (ref 22–32)
Calcium: 9.2 mg/dL (ref 8.9–10.3)
Chloride: 97 mmol/L — ABNORMAL LOW (ref 98–111)
Creatinine, Ser: 17.96 mg/dL — ABNORMAL HIGH (ref 0.61–1.24)
GFR, Estimated: 3 mL/min — ABNORMAL LOW (ref >60)
Glucose, Bld: 110 mg/dL — ABNORMAL HIGH (ref 70–99)
Potassium: 5.3 mmol/L — ABNORMAL HIGH (ref 3.5–5.1)
Sodium: 136 mmol/L (ref 135–145)

## 2024-01-31 LAB — COMPREHENSIVE METABOLIC PANEL WITH GFR
ALT: 19 U/L (ref 0–44)
AST: 12 U/L — ABNORMAL LOW (ref 15–41)
Albumin: 4.1 g/dL (ref 3.5–5.0)
Alkaline Phosphatase: 59 U/L (ref 38–126)
Anion gap: 26 — ABNORMAL HIGH (ref 5–15)
BUN: 78 mg/dL — ABNORMAL HIGH (ref 6–20)
CO2: 20 mmol/L — ABNORMAL LOW (ref 22–32)
Calcium: 9.8 mg/dL (ref 8.9–10.3)
Chloride: 91 mmol/L — ABNORMAL LOW (ref 98–111)
Creatinine, Ser: 16.83 mg/dL — ABNORMAL HIGH (ref 0.61–1.24)
GFR, Estimated: 3 mL/min — ABNORMAL LOW (ref >60)
Glucose, Bld: 116 mg/dL — ABNORMAL HIGH (ref 70–99)
Potassium: 5.4 mmol/L — ABNORMAL HIGH (ref 3.5–5.1)
Sodium: 137 mmol/L (ref 135–145)
Total Bilirubin: 0.6 mg/dL (ref 0.0–1.2)
Total Protein: 9.7 g/dL — ABNORMAL HIGH (ref 6.5–8.1)

## 2024-01-31 LAB — HEPATIC FUNCTION PANEL
ALT: 13 U/L (ref 0–44)
AST: 16 U/L (ref 15–41)
Albumin: 3.9 g/dL (ref 3.5–5.0)
Alkaline Phosphatase: 54 U/L (ref 38–126)
Bilirubin, Direct: 0.4 mg/dL — ABNORMAL HIGH (ref 0.0–0.2)
Indirect Bilirubin: 1 mg/dL — ABNORMAL HIGH (ref 0.3–0.9)
Total Bilirubin: 1.4 mg/dL — ABNORMAL HIGH (ref 0.0–1.2)
Total Protein: 8 g/dL (ref 6.5–8.1)

## 2024-01-31 LAB — MRSA NEXT GEN BY PCR, NASAL: MRSA by PCR Next Gen: NOT DETECTED

## 2024-01-31 LAB — C DIFFICILE QUICK SCREEN W PCR REFLEX
C Diff antigen: NEGATIVE
C Diff interpretation: NOT DETECTED
C Diff toxin: NEGATIVE

## 2024-01-31 LAB — HEPATITIS PANEL, ACUTE
HCV Ab: NONREACTIVE
Hep A IgM: NONREACTIVE
Hep B C IgM: NONREACTIVE
Hepatitis B Surface Ag: NONREACTIVE

## 2024-01-31 LAB — RESP PANEL BY RT-PCR (RSV, FLU A&B, COVID)  RVPGX2
Influenza A by PCR: NEGATIVE
Influenza B by PCR: NEGATIVE
Resp Syncytial Virus by PCR: NEGATIVE
SARS Coronavirus 2 by RT PCR: NEGATIVE

## 2024-01-31 LAB — HEPATITIS B SURFACE ANTIGEN: Hepatitis B Surface Ag: NONREACTIVE

## 2024-01-31 LAB — HIV ANTIBODY (ROUTINE TESTING W REFLEX): HIV Screen 4th Generation wRfx: NONREACTIVE

## 2024-01-31 LAB — LIPASE, BLOOD: Lipase: 26 U/L (ref 11–51)

## 2024-01-31 MED ORDER — ONDANSETRON HCL 4 MG/2ML IJ SOLN
4.0000 mg | Freq: Three times a day (TID) | INTRAMUSCULAR | Status: DC | PRN
Start: 2024-01-31 — End: 2024-02-02
  Administered 2024-01-31: 4 mg via INTRAVENOUS
  Filled 2024-01-31: qty 2

## 2024-01-31 MED ORDER — PROCHLORPERAZINE EDISYLATE 10 MG/2ML IJ SOLN
10.0000 mg | Freq: Once | INTRAMUSCULAR | Status: AC
  Administered 2024-01-31: 10 mg via INTRAVENOUS
  Filled 2024-01-31: qty 2

## 2024-01-31 MED ORDER — SODIUM CHLORIDE 0.9 % IV BOLUS
1000.0000 mL | Freq: Once | INTRAVENOUS | Status: AC
  Administered 2024-01-31: 1000 mL via INTRAVENOUS

## 2024-01-31 MED ORDER — ADULT MULTIVITAMIN W/MINERALS CH
1.0000 | ORAL_TABLET | Freq: Every day | ORAL | Status: DC
  Filled 2024-01-31: qty 1

## 2024-01-31 MED ORDER — FERRIC CITRATE 1 GM 210 MG(FE) PO TABS
420.0000 mg | ORAL_TABLET | Freq: Three times a day (TID) | ORAL | Status: DC
  Administered 2024-01-31 – 2024-02-02 (×3): 420 mg via ORAL
  Filled 2024-01-31 (×3): qty 2

## 2024-01-31 MED ORDER — SODIUM CHLORIDE 0.9 % IV SOLN: INTRAVENOUS | Status: DC

## 2024-01-31 MED ORDER — ONDANSETRON HCL 4 MG/2ML IJ SOLN
4.0000 mg | Freq: Once | INTRAMUSCULAR | Status: AC
  Administered 2024-01-31: 4 mg via INTRAVENOUS
  Filled 2024-01-31: qty 2

## 2024-01-31 MED ORDER — SODIUM ZIRCONIUM CYCLOSILICATE 10 G PO PACK
10.0000 g | PACK | ORAL | Status: AC
  Administered 2024-01-31: 10 g via ORAL
  Filled 2024-01-31: qty 1

## 2024-01-31 MED ORDER — HYDROMORPHONE HCL 1 MG/ML IJ SOLN
0.5000 mg | Freq: Once | INTRAMUSCULAR | Status: AC
  Administered 2024-01-31: 0.5 mg via INTRAVENOUS
  Filled 2024-01-31: qty 1

## 2024-01-31 MED ORDER — SODIUM ZIRCONIUM CYCLOSILICATE 10 G PO PACK
10.0000 g | PACK | Freq: Once | ORAL | Status: AC
  Administered 2024-01-31: 10 g via ORAL
  Filled 2024-01-31: qty 1

## 2024-01-31 MED ORDER — SODIUM ZIRCONIUM CYCLOSILICATE 10 G PO PACK: 10.0000 g | PACK | Freq: Once | ORAL | Status: DC

## 2024-01-31 MED ORDER — HEPARIN SODIUM (PORCINE) 5000 UNIT/ML IJ SOLN
5000.0000 [IU] | Freq: Three times a day (TID) | INTRAMUSCULAR | Status: DC
  Administered 2024-01-31 – 2024-02-02 (×5): 5000 [IU] via SUBCUTANEOUS
  Filled 2024-01-31 (×4): qty 1

## 2024-01-31 MED ORDER — ONDANSETRON 4 MG PO TBDP
4.0000 mg | ORAL_TABLET | Freq: Three times a day (TID) | ORAL | Status: DC | PRN
Start: 2024-01-31 — End: 2024-02-02
  Administered 2024-02-01: 4 mg via ORAL
  Filled 2024-01-31: qty 1

## 2024-01-31 MED ORDER — ACETAMINOPHEN 325 MG PO TABS
650.0000 mg | ORAL_TABLET | Freq: Four times a day (QID) | ORAL | Status: DC | PRN
  Administered 2024-01-31 – 2024-02-02 (×3): 650 mg via ORAL
  Filled 2024-01-31 (×3): qty 2

## 2024-01-31 MED ORDER — ADULT MULTIVITAMIN W/MINERALS CH
1.0000 | ORAL_TABLET | Freq: Every day | ORAL | Status: DC
  Administered 2024-01-31 – 2024-02-02 (×3): 1 via ORAL
  Filled 2024-01-31 (×3): qty 1

## 2024-01-31 MED ORDER — CHLORHEXIDINE GLUCONATE CLOTH 2 % EX PADS
6.0000 | MEDICATED_PAD | Freq: Every day | CUTANEOUS | Status: DC
  Administered 2024-02-02: 6 via TOPICAL

## 2024-01-31 MED ORDER — PSYLLIUM 95 % PO PACK
1.0000 | PACK | Freq: Three times a day (TID) | ORAL | Status: DC
  Administered 2024-01-31 – 2024-02-02 (×5): 1 via ORAL
  Filled 2024-01-31 (×7): qty 1

## 2024-01-31 NOTE — ED Provider Notes (Signed)
 [place] [facility] Provider Note   CSN: [id number] Arrival date & time: 01/31/24  9472     Patient presents with: Emesis and Diarrhea   Michael [name] is a 48 y.o. male with past medical history of polycystic kidney disease, ESRD on dialysis, and hypertension presenting to the emergency department for nausea, vomiting, diarrhea.  Patient reports he has had progressively worsening symptoms since Monday.  He had a known sick contact over the weekend, friend's child was sick.  He denies fevers and chills, endorses malaise and generalized bodyaches.  Patient was supposed to attend dialysis this morning but unable to attend due to incontinence to stool.  Patient reports difficulty keeping down food or water  and feeling like everything is running through him.     Emesis Associated symptoms: diarrhea   Diarrhea Associated symptoms: vomiting        Prior to Admission medications   Medication Sig Start Date End Date Taking? Authorizing Provider  AURYXIA 1 GM 210 MG(Fe) tablet Take 420 mg by mouth 3 (three) times daily with meals. 08/10/22   [provider]  Multiple Vitamin (MULTIVITAMIN) tablet Take 1 tablet by mouth daily.    [provider]  isosorbide  dinitrate (ISORDIL ) 10 MG tablet Take 1 tablet (10 mg total) by mouth 2 (two) times daily. Patient not taking: No sig reported 02/04/20 03/24/20  Tilford Bertram HERO, FNP    Allergies: Ivp dye [iodinated contrast media]    Review of Systems  Gastrointestinal:  Positive for diarrhea and vomiting.    Updated Vital Signs BP 94/66   Pulse (!) 105   Temp 98.2 F (36.8 C)   Resp (!) 22   Ht 6' 2 (1.88 m)   Wt 115.7 kg   SpO2 97%   BMI 32.74 kg/m   Physical Exam Vitals and nursing note reviewed.  Constitutional:      General: He is not in acute distress.    Appearance: He is well-developed.  HENT:     Head: Normocephalic and atraumatic.  Eyes:     Conjunctiva/sclera:  Conjunctivae normal.  Cardiovascular:     Rate and Rhythm: Normal rate and regular rhythm.     Heart sounds: No murmur heard. Pulmonary:     Effort: Pulmonary effort is normal. No respiratory distress.     Breath sounds: Normal breath sounds.  Abdominal:     General: Abdomen is flat.     Palpations: Abdomen is soft.     Tenderness: There is no abdominal tenderness.  Musculoskeletal:        General: No swelling.     Cervical back: Neck supple.     Right lower leg: No edema.     Left lower leg: No edema.  Skin:    General: Skin is warm and dry.     Capillary Refill: Capillary refill takes less than 2 seconds.  Neurological:     Mental Status: He is alert.     (all labs ordered are listed, but only abnormal results are displayed) Labs Reviewed  CBC WITH DIFFERENTIAL/PLATELET - Abnormal; Notable for the following components:      Result Value   WBC 12.9 (*)    RBC 5.85 (*)    Hemoglobin 17.2 (*)    HCT 52.6 (*)    RDW 15.7 (*)    Monocytes Absolute 1.2 (*)    Abs Immature Granulocytes 0.09 (*)    All other components within normal limits  COMPREHENSIVE METABOLIC PANEL WITH  GFR - Abnormal; Notable for the following components:   Potassium 5.4 (*)    Chloride 91 (*)    CO2 20 (*)    Glucose, Bld 116 (*)    BUN 78 (*)    Creatinine, Ser 16.83 (*)    Total Protein 9.7 (*)    AST 12 (*)    GFR, Estimated 3 (*)    Anion gap 26 (*)    All other components within normal limits  RESP PANEL BY RT-PCR (RSV, FLU A&B, COVID)  RVPGX2  C DIFFICILE QUICK SCREEN W PCR REFLEX    GASTROINTESTINAL PANEL BY PCR, STOOL (REPLACES STOOL CULTURE)  LIPASE, BLOOD  HIV ANTIBODY (ROUTINE TESTING W REFLEX)  HEPATIC FUNCTION PANEL  HEPATITIS PANEL, ACUTE    EKG: EKG Interpretation Date/Time:  Friday January 31 2024 05:34:23 EST Ventricular Rate:  94 PR Interval:  138 QRS Duration:  86 QT Interval:  332 QTC Calculation: 416 R Axis:   17  Text Interpretation: Sinus rhythm LAE, consider  biatrial enlargement Minimal ST depression when compared to prior, similar appearance No STEMI Confirmed by Ginger Barefoot ([id number]) on 01/31/2024 7:38:15 AM  Radiology: [facility] Chest 1 View Result Date: 01/31/2024 CLINICAL DATA:  Chest pain. EXAM: CHEST  1 VIEW COMPARISON:  12/16/2023 FINDINGS: Low volumes. Cardiopericardial silhouette is at upper limits of normal for size. The lungs are clear without focal pneumonia, edema, pneumothorax or pleural effusion. No acute bony abnormality. Telemetry leads overlie the chest. IMPRESSION: Low volume film without acute cardiopulmonary findings. Electronically Signed   By: Camellia Candle M.D.   On: 01/31/2024 07:12     Procedures   Medications Ordered in the ED  heparin  injection 5,000 Units (5,000 Units Subcutaneous Given 01/31/24 1340)  ondansetron  (ZOFRAN -ODT) disintegrating tablet 4 mg ( Oral See Alternative 01/31/24 1101)    Or  ondansetron  (ZOFRAN ) injection 4 mg (4 mg Intravenous Given 01/31/24 1101)  0.9 %  sodium chloride  infusion ( Intravenous New Bag/Given 01/31/24 1220)  ferric citrate (AURYXIA) tablet 420 mg (420 mg Oral Given 01/31/24 1135)  acetaminophen  (TYLENOL ) tablet 650 mg (has no administration in time range)  multivitamin with minerals tablet 1 tablet (1 tablet Oral Given 01/31/24 1135)  sodium chloride  0.9 % bolus 1,000 mL (0 mLs Intravenous Stopped 01/31/24 0822)  ondansetron  (ZOFRAN ) injection 4 mg (4 mg Intravenous Given 01/31/24 0643)  prochlorperazine  (COMPAZINE ) injection 10 mg (10 mg Intravenous Given 01/31/24 0859)  HYDROmorphone  (DILAUDID ) injection 0.5 mg (0.5 mg Intravenous Given 01/31/24 0858)  sodium chloride  0.9 % bolus 1,000 mL (0 mLs Intravenous Stopped 01/31/24 1221)  sodium zirconium cyclosilicate  (LOKELMA ) packet 10 g (10 g Oral Given 01/31/24 1135)    Clinical Course as of 01/31/24 1344  Fri Jan 31, 2024  9365 N/V/D since Monday, sick contact on sat [LB]  0712 CBC with Differential(!) Mild leukocytosis,  polycythemia likely in hemoconcentration in the setting of GI losses  [LB]  0714 EKG 12-Lead NSR, no evidence of acute ischemia, unchanged from previous  [LB]  0733 DG Chest 1 View No opacity or consolidation concerning for PNA [LB]  0813 Comprehensive metabolic panel(!) Mild hyperkalemia, creatinine and BUN elevated from baseline [LB]  0815 Anion gap(!): 26 Anion gap metabolic acidosis, likely in the setting of uremia and ketosis [LB]    Clinical Course User Index [LB] Sharlet Dowdy, MD  Medical Decision Making Patient is a 48 year old male with ESRD on dialysis presenting for 1 week of persistent nausea, vomiting, and diarrhea.  On arrival, patient was found to be hypotensive with blood pressure 88/52,  during my evaluation this had improved to 102/72.  Abdominal exam reassuring, soft, nontender, nondistended.    Differential diagnosis includes but limited to viral gastroenteritis, foodborne illness, hyperglycemia,  dehydration, electrolyte abnormality  Patient with significant GI losses and missed dialysis, lab workup initiated patient given 1 L IV fluid bolus.  Abdomen soft  nontender, no indication for CT abdomen pelvis at this time.  Patient given Zofran  for nausea management.  EKG with normal sinus rhythm, no peaked T waves or prolonged QRS concerning for significant hyperkalemia  Labs with mild leukocytosis, hemoconcentration, mild hyperkalemia, increased creatinine and BUN from baseline, and an anion gap metabolic acidosis.  This is likely in the setting of GI losses and elevated BUN.  Patient requires admission for continued hydration in the setting of acute GI losses. Because patient missed dialysis today, nephrology was consulted who agreed with medical admission and they will follow to facilitate dialysis while inpatient.  Internal medicine team was consulted who agreed to admit the patient.  He was admitted to their team in stable condition.   Please see admitting providers note for remainder patient's care.      Amount and/or Complexity of Data Reviewed Labs: ordered. Decision-making details documented in ED Course. Radiology: ordered and independent interpretation performed. Decision-making details documented in ED Course. ECG/medicine tests: ordered and independent interpretation performed. Decision-making details documented in ED Course.  Risk Prescription drug management. Decision regarding hospitalization.        Final diagnoses:  Dehydration  Nausea vomiting and diarrhea  Gastroenteritis  Increased anion gap metabolic acidosis    ED Discharge Orders     None          Sharlet Dowdy, MD 01/31/24 1344    Tegeler, Lonni PARAS, MD 02/01/24 [phone]

## 2024-01-31 NOTE — ED Notes (Signed)
 X-ray at bedside.

## 2024-01-31 NOTE — ED Notes (Signed)
 Pt actively vomiting in room and now in restroom with diarrhea.

## 2024-01-31 NOTE — ED Triage Notes (Signed)
 PT BIB EMS with c/o N/V/D for 2 days. Denies fevers. Dialysis pt but missed today since being sick. Bp 90/p with ems. Cbg 127 hr 100 98%ra. Pt has right and left sided rib pain. Has emesis multiple time today.

## 2024-01-31 NOTE — Consult Note (Signed)
 Renal Service Consult Note [facility] Michael JONETTA Fret, MD  Patient: Michael [name] Date: 01/31/2024 Requesting Physician: Dr. Eben  Reason for Consult: ESRD pt w/  HPI: The patient is a 48 y.o. year-old w/ PMH as below who presented to ED this morning complaining of N/V/D for 2 days.  No fevers and no abdominal pain.  He has thrown up multiple times today.  In the ED BP was 96/63, HR 90, RR 18, temp 97.  K+ 5.4, CO2 20, anion gap 26, BUN 78, creatinine 16, albumin  4.1.  WBC 12.9, hemoglobin 17.2 (usual hemoglobin for him is 12-13).  He is on dialysis Monday Wednesday Friday, last dialysis was Wednesday.  He is not on any blood pressure lowering medications.  Patient was admitted to medicine service.  We are asked to see for dialysis.   Pt seen seen in room. No c/o's at this time.    ROS - denies CP, no joint pain, no HA, no blurry vision, no rash, no diarrhea, no nausea/ vomiting   Past Medical History  Past Medical History:  Diagnosis Date   Anemia in neoplastic disease    Compression of vein    Disorder of phosphorus metabolism    End stage renal disease (HCC) 12/11/2019   Essential hypertension, malignant    Familial lipoprotein deficiency    GERD (gastroesophageal reflux disease)    Gout    History of renal dialysis    M-W-F   HLD (hyperlipidemia)    Hypertension    Intracranial aneurysm    s/p coil embolization: right middle meningeal artery 09/08/20, pericallosal artery 09/22/20, right MCA 12/22/20   Iron deficiency anemia, unspecified    Melanoma in situ of eyelid (HCC)    Nontraumatic acute subdural hemorrhage (HCC)    Obesity, unspecified    Pneumonia    Polycystic kidney disease    Pruritus of skin    SDH (subdural hematoma) (HCC)    subacute right SDH 08/23/20 CTA head   Secondary hyperparathyroidism of renal origin    Sleep apnea 07/29/2018   uses cpap nightly   Vitamin D  deficiency 11/2018   Past Surgical History  Past Surgical History:   Procedure Laterality Date   A/V FISTULAGRAM N/A 03/26/2023   Procedure: A/V Fistulagram;  Surgeon: Norine Manuelita LABOR, MD;  Location: [facility] INVASIVE CV LAB;  Service: Cardiovascular;  Laterality: N/A;   A/V FISTULAGRAM Left 05/28/2023   Procedure: A/V Fistulagram;  Surgeon: Norine Manuelita LABOR, MD;  Location: [facility] INVASIVE CV LAB;  Service: Cardiovascular;  Laterality: Left;   A/V SHUNT INTERVENTION Left 11/13/2023   Procedure: A/V SHUNT INTERVENTION;  Surgeon: Magda Debby SAILOR, MD;  Location: [facility] LAB;  Service: Cardiovascular;  Laterality: Left;   AV FISTULA PLACEMENT Left 03/24/2020   Procedure: INSERTION OF LEFT UPPER EXTREMITY ARTERIOVENOUS (AV) GORE-TEX GRAFT;  Surgeon: Magda Debby SAILOR, MD;  Location: [facility] OR;  Service: Vascular;  Laterality: Left;  PERIPHERAL NERVE BLOCK   BASCILIC VEIN TRANSPOSITION Left 12/11/2019   Procedure: 1ST STAGE BASILIC TRANSPOSITION OF LEFT ARM;  Surgeon: Eliza Lonni RAMAN, MD;  Location: [facility] OR;  Service: Vascular;  Laterality: Left;   BIOPSY  04/06/2021   Procedure: BIOPSY;  Surgeon: Eda Iha, MD;  Location: [facility];  Service: Gastroenterology;;   COLONOSCOPY WITH PROPOFOL  N/A 04/06/2021   Procedure: COLONOSCOPY WITH PROPOFOL ;  Surgeon: Eda Iha, MD;  Location: [facility];  Service: Gastroenterology;  Laterality: N/A;   COLONOSCOPY WITH PROPOFOL  N/A 05/29/2021   Procedure: COLONOSCOPY WITH PROPOFOL ;  Surgeon: Wilhelmenia Aloha Raddle., MD;  Location: [facility];  Service: Gastroenterology;  Laterality: N/A;   COLONOSCOPY WITH PROPOFOL  N/A 06/28/2022   Procedure: COLONOSCOPY WITH PROPOFOL ;  Surgeon: Mansouraty, Aloha Raddle., MD;  Location: [facility];  Service: Gastroenterology;  Laterality: N/A;   DG AV DIALYSIS GRAFT DECLOT OR Left 05/17/2020   ENDOSCOPIC MUCOSAL RESECTION  05/29/2021   Procedure: ENDOSCOPIC MUCOSAL RESECTION;  Surgeon: Wilhelmenia Aloha Raddle., MD;  Location: [facility];  Service: Gastroenterology;;   HEMOSTASIS  CLIP PLACEMENT  05/29/2021   Procedure: HEMOSTASIS CLIP PLACEMENT;  Surgeon: Wilhelmenia Aloha Raddle., MD;  Location: [facility];  Service: Gastroenterology;;   IR AV DIALY SHUNT INTRO NEEDLE/INTRACATH INITIAL W/PTA/IMG LEFT  05/01/2022   IR FLUORO GUIDE CV LINE RIGHT  12/09/2019   IR US  GUIDE VASC ACCESS LEFT  05/01/2022   IR US  GUIDE VASC ACCESS RIGHT  12/09/2019   LEFT HEART CATH AND CORONARY ANGIOGRAPHY N/A 12/09/2019   Procedure: LEFT HEART CATH AND CORONARY ANGIOGRAPHY;  Surgeon: Ladona Heinz, MD;  Location: [facility]AS[facility] LAB;  Service: Cardiovascular;  Laterality: N/A;   PERIPHERAL VASCULAR BALLOON ANGIOPLASTY Left 03/26/2023   Procedure: PERIPHERAL VASCULAR BALLOON ANGIOPLASTY;  Surgeon: Norine Manuelita LABOR, MD;  Location: [facility];  Service: Cardiovascular;  Laterality: Left;  left AVG 60% VA   POLYPECTOMY  04/06/2021   Procedure: POLYPECTOMY;  Surgeon: Eda Iha, MD;  Location: [facility];  Service: Gastroenterology;;   POLYPECTOMY  05/29/2021   Procedure: POLYPECTOMY;  Surgeon: Wilhelmenia Aloha Raddle., MD;  Location: [facility];  Service: Gastroenterology;;   POLYPECTOMY  06/28/2022   Procedure: POLYPECTOMY;  Surgeon: Wilhelmenia Aloha Raddle., MD;  Location: [facility];  Service: Gastroenterology;;   REVISION OF ARTERIOVENOUS GORETEX GRAFT Left 08/31/2021   Procedure: REDO LEFT UPPER ARM ARTERIOVENOUS GORETEX GRAFT;  Surgeon: Lanis Fonda BRAVO, MD;  Location: [facility] OR;  Service: Vascular;  Laterality: Left;  PERIPHERAL NERVE BLOCK   SUBMUCOSAL INJECTION  05/29/2021   Procedure: SUBMUCOSAL INJECTION;  Surgeon: Wilhelmenia Aloha Raddle., MD;  Location: [facility];  Service: Gastroenterology;;  EPI   SUBMUCOSAL LIFTING INJECTION  05/29/2021   Procedure: SUBMUCOSAL LIFTING INJECTION;  Surgeon: Wilhelmenia Aloha Raddle., MD;  Location: [facility];  Service: Gastroenterology;;  Everlift   SUBMUCOSAL TATTOO INJECTION  04/06/2021   Procedure: SUBMUCOSAL TATTOO INJECTION;   Surgeon: Eda Iha, MD;  Location: [facility];  Service: Gastroenterology;;   UPPER EXTREMITY VENOGRAPHY Bilateral 08/24/2021   Procedure: UPPER EXTREMITY VENOGRAPHY;  Surgeon: Gretta Lonni PARAS, MD;  Location: [facility];  Service: Cardiovascular;  Laterality: Bilateral;   VENOUS ANGIOPLASTY  05/28/2023   Procedure: VENOUS ANGIOPLASTY;  Surgeon: Norine Manuelita LABOR, MD;  Location: [facility];  Service: Cardiovascular;;  Venous Anastomosis; Intragraft   VENOUS ANGIOPLASTY  11/13/2023   Procedure: VENOUS ANGIOPLASTY;  Surgeon: Magda Debby SAILOR, MD;  Location: [facility];  Service: Cardiovascular;;  Venous Anastomosis   Family History  Family History  Problem Relation Age of Onset   Diabetes Mother    Ovarian cancer Mother    Bipolar disorder Father    Hypertension Father    Prostate cancer Father    Prostate cancer Paternal Grandfather    Polycystic kidney disease Neg Hx    Social History  reports that he quit smoking about 10 years ago. His smoking use included cigarettes. He started smoking about 20 years ago. He has a 2 pack-year smoking history. He has never used smokeless tobacco. He reports that he does not currently use  alcohol. He reports that he does not use drugs. Allergies  Allergies  Allergen Reactions   Ivp Dye [Iodinated Contrast Media] Itching   Home medications Prior to Admission medications   Medication Sig Start Date End Date Taking? Authorizing Provider  acetaminophen  (TYLENOL ) 500 MG tablet Take 1,000 mg by mouth 2 (two) times daily as needed for headache, fever or moderate pain (pain score 4-6).   Yes [provider]  AURYXIA 1 GM 210 MG(Fe) tablet Take 210-420 mg by mouth See admin instructions. Take 2 tablets (420mg ) by mouth twice daily with meals and take 1 tablet (210mg ) with snacks 08/10/22  Yes [provider]  gabapentin (NEURONTIN) 300 MG capsule Take 300 mg by mouth daily as needed (neuropaty).   Yes [provider]  lidocaine -prilocaine  (EMLA ) cream Apply 1 Application topically every Monday, Wednesday, and Friday with hemodialysis.   Yes [provider]  Multiple Vitamins-Minerals (MULTIVITAMIN GUMMIES MENS) CHEW Chew 2 each by mouth daily.   Yes [provider]  isosorbide  dinitrate (ISORDIL ) 10 MG tablet Take 1 tablet (10 mg total) by mouth 2 (two) times daily. Patient not taking: No sig reported 02/04/20 03/24/20  Tilford Bertram HERO, FNP     Vitals:   01/31/24 1343 01/31/24 1500 01/31/24 1632 01/31/24 1643  BP: 94/66 97/75  101/75  Pulse: (!) 105 99  99  Resp: (!) 22 (!) 22  18  Temp:    (!) 97.4 F (36.3 C)  TempSrc:      SpO2: 97% 97%  100%  Weight:   109.4 kg   Height:   6' 2 (1.88 m)    Exam Gen alert, no distress Sclera anicteric, throat clear  No jvd or bruits Chest clear bilat to bases RRR no MRG Abd soft ntnd no mass or ascites +bs Ext no LE or UE edema, no other edema Neuro is alert, Ox 3 , nf    LUA AVG+bruit   Home bp meds: No BP lowering meds   OP HD: [facility] MWF 4h  B550   110.6kg   AVG  Heparni 6500 + 1000 midrun Last OP HD 11/12, post wt 111.5kg Usually gets off 0-2 kg over Usual Hb is 12-13 range Bp's a bit high pre HD, and lowest bp is usually 100-110 range    Assessment/ Plan: Acute diarrhea/ hypotension: suspected volume depletion from N/V/D the last 2-3 days. Hb 17 is unusual for him, prob hemoconcentrated. Rec'd 2L bolus in ED and is getting 100 cc/hr IVFs. Agree.  ESRD: on HD MWF. Has not missed HD. K+ up slightly. Will plan HD 1st shift in am tomorrow.  Hyperkalemia: rec'd po lokelma  this am. Will order another dose this evening. HD 1st shift in am. Renal diet.  BP: bp's soft initially, improved a bit after IVFs. Is not on BP lowering meds at home.  Anemia of esrd: Hb 17, should come down w/ IVF's.        Myer Fret  MD [facility] 01/31/2024, 5:43 PM  Recent Labs  Lab 01/31/24 0603  HGB 17.2*  ALBUMIN  4.1   CALCIUM  9.8  CREATININE 16.83*  K 5.4*   Inpatient medications:  ferric citrate  420 mg Oral TID WC   heparin   5,000 Units Subcutaneous Q8H   multivitamin with minerals  1 tablet Oral Daily   psyllium  1 packet Oral TID    sodium chloride  100 mL/hr at 01/31/24 1220   acetaminophen , ondansetron  **OR** ondansetron  (ZOFRAN ) IV

## 2024-01-31 NOTE — Hospital Course (Addendum)
#  Hypotension 2/2 Diarrhea #Dehydration   #Gastroenteritis  #Vomiting  #Leukocytosis  Patient presented to the ED with sx of gastroenteritis. Due to his diarrhea and poor oral intake, he was hypotensive in the ED. His blood pressure normalized after IVF. GI panel revealed ETEC infection. Due to the diarrhea causing hypotension and admission, patient wa started on a 3 day course of azithromycin.   #ESRD on HD MWF #PCKD #Hyperkalemia 2/2 missed HD Patient last had HD on Wednesday prior to being admitted. He had HD on Saturday due to hyperkalemia. He received Lokelma  while admitted.

## 2024-01-31 NOTE — H&P (Signed)
 Date: 01/31/2024               Patient Name:  Michael [name] MRN: [id number]  DOB: 1975/09/19 Age / Sex: 48 y.o., male   PCP: Patient, No Pcp Per         Medical Service: Internal Medicine Teaching Service         Attending Physician: Dr. Eben, Reyes [name], MD      First Contact: Intern on Call: [phone]       Second Contact: Resident on Call:[phone]                    SUBJECTIVE   Chief Complaint: Nausea, vomiting   History of Present Illness: Michael [name] is a 48 year old male with a past medical history of ESRD on HD MWF 2/2 PCKD who presents today for evaluation of diarrhea, vomiting, and dizziness.   Per patient, he started to have diarrhea on Wednesday that progressed since. He also reported vomiting as well that started then. He reports having more than 7 BM per day as well as having BM in his bed and on the floor. Additionally, he has felt dizzy since the diarrhea started, especially with standing. Fortunately, he has not LOC yet. He reports myalgias and chills at home but denies fevers. He takes a medicine that turns his stools black so doesn't know if theres blood in stool, but denies pain with passing stool. Also denies hematuria and dysuria.   He reports a sick child drinking out of his cup at a picnic.   Of note, he still produces urine and his last HD session was on Wednesday.   He does not take anti-HTN medications.   Review of Systems: A complete ROS was negative except as per HPI.   ED Course: Hypotension and afebrile in the ED.  K: 5.4 BUN: 78 Cr: 16.83 WBC: 12.9  Past Medical History: ESRD on HD MWF 2/2 PCKD   Meds:  Auryxia  Allergies: Allergies as of 01/31/2024 - Review Complete 01/31/2024  Allergen Reaction Noted   Ivp dye [iodinated contrast media] Itching 05/29/2021    Past Surgical History:  Procedure Laterality Date   A/V FISTULAGRAM N/A 03/26/2023   Procedure: A/V Fistulagram;  Surgeon: Norine Manuelita LABOR, MD;  Location: [facility];  Service: Cardiovascular;  Laterality: N/A;   A/V FISTULAGRAM Left 05/28/2023   Procedure: A/V Fistulagram;  Surgeon: Norine Manuelita LABOR, MD;  Location: [facility] INVASIVE CV LAB;  Service: Cardiovascular;  Laterality: Left;   A/V SHUNT INTERVENTION Left 11/13/2023   Procedure: A/V SHUNT INTERVENTION;  Surgeon: Magda Debby SAILOR, MD;  Location: [facility] PV LAB;  Service: Cardiovascular;  Laterality: Left;   AV FISTULA PLACEMENT Left 03/24/2020   Procedure: INSERTION OF LEFT UPPER EXTREMITY ARTERIOVENOUS (AV) GORE-TEX GRAFT;  Surgeon: Magda Debby SAILOR, MD;  Location: [facility] OR;  Service: Vascular;  Laterality: Left;  PERIPHERAL NERVE BLOCK   BASCILIC VEIN TRANSPOSITION Left 12/11/2019   Procedure: 1ST STAGE BASILIC TRANSPOSITION OF LEFT ARM;  Surgeon: Eliza Lonni RAMAN, MD;  Location: [facility] OR;  Service: Vascular;  Laterality: Left;   BIOPSY  04/06/2021   Procedure: BIOPSY;  Surgeon: Eda Iha, MD;  Location: [facility];  Service: Gastroenterology;;   COLONOSCOPY WITH PROPOFOL  N/A 04/06/2021   Procedure: COLONOSCOPY WITH PROPOFOL ;  Surgeon: Eda Iha, MD;  Location: [facility];  Service: Gastroenterology;  Laterality: N/A;   COLONOSCOPY WITH PROPOFOL  N/A 05/29/2021   Procedure: COLONOSCOPY WITH PROPOFOL ;  Surgeon: Wilhelmenia Aloha Raddle.,  MD;  Location: [facility];  Service: Gastroenterology;  Laterality: N/A;   COLONOSCOPY WITH PROPOFOL  N/A 06/28/2022   Procedure: COLONOSCOPY WITH PROPOFOL ;  Surgeon: Mansouraty, Aloha Raddle., MD;  Location: [facility];  Service: Gastroenterology;  Laterality: N/A;   DG AV DIALYSIS GRAFT DECLOT OR Left 05/17/2020   ENDOSCOPIC MUCOSAL RESECTION  05/29/2021   Procedure: ENDOSCOPIC MUCOSAL RESECTION;  Surgeon: Wilhelmenia Aloha Raddle., MD;  Location: [facility];  Service: Gastroenterology;;   HEMOSTASIS CLIP PLACEMENT  05/29/2021   Procedure: HEMOSTASIS CLIP PLACEMENT;  Surgeon: Wilhelmenia Aloha Raddle., MD;  Location: [facility];  Service:  Gastroenterology;;   IR AV DIALY SHUNT INTRO NEEDLE/INTRACATH INITIAL W/PTA/IMG LEFT  05/01/2022   IR FLUORO GUIDE CV LINE RIGHT  12/09/2019   IR US  GUIDE VASC ACCESS LEFT  05/01/2022   IR US  GUIDE VASC ACCESS RIGHT  12/09/2019   LEFT HEART CATH AND CORONARY ANGIOGRAPHY N/A 12/09/2019   Procedure: LEFT HEART CATH AND CORONARY ANGIOGRAPHY;  Surgeon: Ladona Heinz, MD;  Location: [facility];  Service: Cardiovascular;  Laterality: N/A;   PERIPHERAL VASCULAR BALLOON ANGIOPLASTY Left 03/26/2023   Procedure: PERIPHERAL VASCULAR BALLOON ANGIOPLASTY;  Surgeon: Norine Manuelita LABOR, MD;  Location: [facility];  Service: Cardiovascular;  Laterality: Left;  left AVG 60% VA   POLYPECTOMY  04/06/2021   Procedure: POLYPECTOMY;  Surgeon: Eda Iha, MD;  Location: [facility];  Service: Gastroenterology;;   POLYPECTOMY  05/29/2021   Procedure: POLYPECTOMY;  Surgeon: Wilhelmenia Aloha Raddle., MD;  Location: [facility];  Service: Gastroenterology;;   POLYPECTOMY  06/28/2022   Procedure: POLYPECTOMY;  Surgeon: Wilhelmenia Aloha Raddle., MD;  Location: [facility];  Service: Gastroenterology;;   REVISION OF ARTERIOVENOUS GORETEX GRAFT Left 08/31/2021   Procedure: REDO LEFT UPPER ARM ARTERIOVENOUS GORETEX GRAFT;  Surgeon: Lanis Fonda BRAVO, MD;  Location: [facility] OR;  Service: Vascular;  Laterality: Left;  PERIPHERAL NERVE BLOCK   SUBMUCOSAL INJECTION  05/29/2021   Procedure: SUBMUCOSAL INJECTION;  Surgeon: Wilhelmenia Aloha Raddle., MD;  Location: [facility];  Service: Gastroenterology;;  EPI   SUBMUCOSAL LIFTING INJECTION  05/29/2021   Procedure: SUBMUCOSAL LIFTING INJECTION;  Surgeon: Wilhelmenia Aloha Raddle., MD;  Location: [facility];  Service: Gastroenterology;;  Everlift   SUBMUCOSAL TATTOO INJECTION  04/06/2021   Procedure: SUBMUCOSAL TATTOO INJECTION;  Surgeon: Eda Iha, MD;  Location: [facility];  Service: Gastroenterology;;   UPPER EXTREMITY VENOGRAPHY Bilateral 08/24/2021    Procedure: UPPER EXTREMITY VENOGRAPHY;  Surgeon: Gretta Lonni PARAS, MD;  Location: [facility];  Service: Cardiovascular;  Laterality: Bilateral;   VENOUS ANGIOPLASTY  05/28/2023   Procedure: VENOUS ANGIOPLASTY;  Surgeon: Norine Manuelita LABOR, MD;  Location: [facility];  Service: Cardiovascular;;  Venous Anastomosis; Intragraft   VENOUS ANGIOPLASTY  11/13/2023   Procedure: VENOUS ANGIOPLASTY;  Surgeon: Magda Debby SAILOR, MD;  Location: [facility];  Service: Cardiovascular;;  Venous Anastomosis    Social:  Lives With:Self Occupation:Works with special needs people  Level of Function: Independent  ERE:Imjtampihz  Substances: -T:None -A:None -D:None  Family History:  Family History  Problem Relation Age of Onset   Diabetes Mother    Ovarian cancer Mother    Bipolar disorder Father    Hypertension Father    Prostate cancer Father    Prostate cancer Paternal Grandfather    Polycystic kidney disease Neg Hx       OBJECTIVE:   Physical Exam: Blood pressure 90/72, pulse 97, temperature 98.1 F (36.7 C), temperature source Oral, resp. rate 17, height 6'  2 (1.88 m), weight 115.7 kg, SpO2 95%.  Constitutional: well-appearing male sitting in hospital bed, in no acute distress HENT: normocephalic atraumatic, mucous membranes dry Cardiovascular: regular rate and rhythm, no m/r/g, no JVD, left upper arm HD graft with palpable thrill and bruit  Pulmonary/Chest: normal work of breathing on room air Abdominal: soft, non-distended. Audible gurgling with palpation over RUQ Neurological: alert & oriented x 3 MSK: no gross abnormalities. Neg pitting edema.  Skin: warm and dry, decreased skin turgor Psych: Normal mood and affect  Labs:    Latest Ref Rng & Units 01/31/2024    6:03 AM 12/16/2023   11:28 AM 10/10/2022   12:00 AM  CBC  WBC 4.0 - 10.5 K/uL 12.9  7.2  7.7       7.7      Hemoglobin 13.0 - 17.0 g/dL 82.7  86.1  87.6      Hematocrit 39.0 - 52.0 % 52.6  42.5     Platelets 150 - 400 K/uL 288  263  273         This result is from an external source.        Latest Ref Rng & Units 01/31/2024    6:03 AM 12/16/2023   11:28 AM 10/10/2022   12:00 AM  CMP  Glucose 70 - 99 mg/dL 883  88    BUN 6 - 20 mg/dL 78  25    Creatinine 9.38 - 1.24 mg/dL 83.16  2.00  84.1      Sodium 135 - 145 mmol/L 137  140  139      Potassium 3.5 - 5.1 mmol/L 5.4  3.9  5.2      Chloride 98 - 111 mmol/L 91  95    CO2 22 - 32 mmol/L 20  27    Calcium  8.9 - 10.3 mg/dL 9.8  8.9  8.4      Total Protein 6.5 - 8.1 g/dL 9.7     Total Bilirubin 0.0 - 1.2 mg/dL 0.6     Alkaline Phos 38 - 126 U/L 59     AST 15 - 41 U/L 12     ALT 0 - 44 U/L 19        This result is from an external source.      Imaging: DG Chest 1 View Result Date: 01/31/2024 CLINICAL DATA:  Chest pain. EXAM: CHEST  1 VIEW COMPARISON:  12/16/2023 FINDINGS: Low volumes. Cardiopericardial silhouette is at upper limits of normal for size. The lungs are clear without focal pneumonia, edema, pneumothorax or pleural effusion. No acute bony abnormality. Telemetry leads overlie the chest. IMPRESSION: Low volume film without acute cardiopulmonary findings. Electronically Signed   By: Camellia Candle M.D.   On: 01/31/2024 07:12      EKG: personally reviewed my interpretation is NSR.  ASSESSMENT & PLAN:   Assessment & Plan by Problem: Principal Problem:   Dehydration Active Problems:   Hyperkalemia   Polycystic kidney disease   ESRD on dialysis (HCC)   Gastroenteritis   Hypotension   Michael [name] is a 48 year old male with a past medical history of ESRD on HD MWF 2/2 PCKD who presents today for evaluation of diarrhea, vomiting, and dizziness who is being admitted for hypotension 2/2 diarrhea.   #Hypotension 2/2 Diarrhea #Dehydration   #Gastroenteritis  #Vomiting  #Leukocytosis  Patient presented to the ED with sx of gastroenteritis. Due to his diarrhea and poor oral intake, he was hypotensive in the ED.  Patient has evidence of dehydration with hemoconcentration, quite an elevated BUN (higher than baseline), dry mucous membranes, as well as decreased skin turgor. Fortunately, he continues to make urine at this time so we can continue to provide IVF hydration.  Plan: -GI Panel ordered -C diff ordered -Hepatitis panel ordered -If diarrhea is not infectious, then we may start imodium  -s/p 2L NS bolus, on NS infusion 100ml/hr -Encourage oral hydration and nutrition -Zofran  4mg  q8 prn  -Orthostatic vital signs prior to discharge   #ESRD on HD MWF #PCKD #Hyperkalemia  Patient last had HD on Wednesday. He does not have nay emergent indications for HD at this time.  Plan: -Lokelma  10g -Nephrology consulted, appreciate recommendations    Diet: Renal VTE: Heparin  Code: Full  Prior to Admission Living Arrangement: Home, living with self Anticipated Discharge Location: Home Barriers to Discharge: Resolution of hypotension   Dispo: Admit patient to Observation with expected length of stay less than 2 midnights.  Signed:   Viktoria King Internal Medicine Resident PGY-1 01/31/2024, 1:43 PM   Please contact the on call pager at [phone]

## 2024-01-31 NOTE — Plan of Care (Signed)

## 2024-02-01 ENCOUNTER — Other Ambulatory Visit (HOSPITAL_COMMUNITY): Payer: Self-pay

## 2024-02-01 DIAGNOSIS — A041 Enterotoxigenic Escherichia coli infection: Secondary | ICD-10-CM

## 2024-02-01 DIAGNOSIS — E860 Dehydration: Secondary | ICD-10-CM | POA: Diagnosis not present

## 2024-02-01 DIAGNOSIS — I959 Hypotension, unspecified: Secondary | ICD-10-CM

## 2024-02-01 DIAGNOSIS — E875 Hyperkalemia: Secondary | ICD-10-CM

## 2024-02-01 DIAGNOSIS — Z992 Dependence on renal dialysis: Secondary | ICD-10-CM | POA: Diagnosis not present

## 2024-02-01 DIAGNOSIS — N186 End stage renal disease: Secondary | ICD-10-CM | POA: Diagnosis not present

## 2024-02-01 LAB — GASTROINTESTINAL PANEL BY PCR, STOOL (REPLACES STOOL CULTURE)

## 2024-02-01 LAB — RENAL FUNCTION PANEL
Albumin: 3.5 g/dL (ref 3.5–5.0)
Anion gap: 22 — ABNORMAL HIGH (ref 5–15)
BUN: 95 mg/dL — ABNORMAL HIGH (ref 6–20)
CO2: 17 mmol/L — ABNORMAL LOW (ref 22–32)
Calcium: 8.8 mg/dL — ABNORMAL LOW (ref 8.9–10.3)
Chloride: 97 mmol/L — ABNORMAL LOW (ref 98–111)
Creatinine, Ser: 18.82 mg/dL — ABNORMAL HIGH (ref 0.61–1.24)
GFR, Estimated: 3 mL/min — ABNORMAL LOW (ref >60)
Glucose, Bld: 95 mg/dL (ref 70–99)
Phosphorus: 6.4 mg/dL — ABNORMAL HIGH (ref 2.5–4.6)
Potassium: 5.3 mmol/L — ABNORMAL HIGH (ref 3.5–5.1)
Sodium: 136 mmol/L (ref 135–145)

## 2024-02-01 LAB — CBC
HCT: 45.6 % (ref 39.0–52.0)
Hemoglobin: 14.7 g/dL (ref 13.0–17.0)
MCH: 28.9 pg (ref 26.0–34.0)
MCHC: 32.2 g/dL (ref 30.0–36.0)
MCV: 89.8 fL (ref 80.0–100.0)
Platelets: 230 K/uL (ref 150–400)
RBC: 5.08 MIL/uL (ref 4.22–5.81)
RDW: 15.4 % (ref 11.5–15.5)
WBC: 9 K/uL (ref 4.0–10.5)
nRBC: 0.2 % (ref 0.0–0.2)

## 2024-02-01 LAB — MAGNESIUM: Magnesium: 2 mg/dL (ref 1.7–2.4)

## 2024-02-01 MED ORDER — ANTICOAGULANT SODIUM CITRATE 4% (200MG/5ML) IV SOLN: 5.0000 mL | Status: DC | PRN

## 2024-02-01 MED ORDER — HEPARIN SODIUM (PORCINE) 1000 UNIT/ML DIALYSIS: 1500.0000 [IU] | INTRAMUSCULAR | Status: DC | PRN

## 2024-02-01 MED ORDER — AZITHROMYCIN 500 MG PO TABS
500.0000 mg | ORAL_TABLET | Freq: Every day | ORAL | Status: DC
  Administered 2024-02-01 – 2024-02-02 (×2): 500 mg via ORAL
  Filled 2024-02-01 (×2): qty 1

## 2024-02-01 MED ORDER — ONDANSETRON 4 MG PO TBDP
4.0000 mg | ORAL_TABLET | Freq: Three times a day (TID) | ORAL | 0 refills | Status: DC | PRN
  Filled 2024-02-01: qty 20, 7d supply, fill #0

## 2024-02-01 MED ORDER — ALTEPLASE 2 MG IJ SOLR: 2.0000 mg | Freq: Once | INTRAMUSCULAR | Status: DC | PRN

## 2024-02-01 MED ORDER — HEPARIN SODIUM (PORCINE) 1000 UNIT/ML DIALYSIS: 1000.0000 [IU] | INTRAMUSCULAR | Status: DC | PRN

## 2024-02-01 MED ORDER — AZITHROMYCIN 500 MG PO TABS
500.0000 mg | ORAL_TABLET | Freq: Every day | ORAL | 0 refills | Status: DC
  Filled 2024-02-01: qty 2, 2d supply, fill #0

## 2024-02-01 MED ORDER — PSYLLIUM 95 % PO PACK
1.0000 | PACK | Freq: Three times a day (TID) | ORAL | 0 refills | Status: AC
  Filled 2024-02-01: qty 42, 14d supply, fill #0

## 2024-02-01 MED ORDER — HEPARIN SODIUM (PORCINE) 1000 UNIT/ML DIALYSIS: 5500.0000 [IU] | Freq: Once | INTRAMUSCULAR | Status: DC

## 2024-02-01 MED ORDER — SODIUM ZIRCONIUM CYCLOSILICATE 5 G PO PACK
5.0000 g | PACK | Freq: Once | ORAL | Status: AC
  Administered 2024-02-01: 5 g via ORAL
  Filled 2024-02-01: qty 1

## 2024-02-01 MED ORDER — LIDOCAINE-PRILOCAINE 2.5-2.5 % EX CREA: 1.0000 | TOPICAL_CREAM | CUTANEOUS | Status: DC | PRN

## 2024-02-01 MED ORDER — NEPRO/CARBSTEADY PO LIQD
237.0000 mL | ORAL | Status: DC | PRN
Start: 2024-02-01 — End: 2024-02-02

## 2024-02-01 MED ORDER — HEPARIN SODIUM (PORCINE) 1000 UNIT/ML IJ SOLN
INTRAMUSCULAR | Status: AC
Start: 2024-02-01 — End: 2024-02-01
  Filled 2024-02-01: qty 8

## 2024-02-01 MED ORDER — PENTAFLUOROPROP-TETRAFLUOROETH EX AERO: 1.0000 | INHALATION_SPRAY | CUTANEOUS | Status: DC | PRN

## 2024-02-01 MED ORDER — LIDOCAINE HCL (PF) 1 % IJ SOLN
5.0000 mL | INTRAMUSCULAR | Status: DC | PRN
Start: 2024-02-01 — End: 2024-02-02

## 2024-02-01 NOTE — Progress Notes (Signed)
  Received patient in bed to unit.   Informed consent signed and in chart.    TX duration:     Transported by  Hand-off given to patient's nurse.    Access used: left graft Access issues: none   Total UF removed: 400 Medication(s) given: none Post HD VS: 114/75 Ama due too loose stools     Genest hacking LPN [facility]

## 2024-02-01 NOTE — Progress Notes (Signed)
   Subjective:   Patient is no longer feeling dizzy but endorses diarrhea. His vomiting has gotten better. He feels that his bottom is sore from having diarrhea. He denied recent travel.   Objective:  Vital signs in last 24 hours: Vitals:   01/31/24 1632 01/31/24 1643 01/31/24 1958 02/01/24 0540  BP:  101/75 107/66 116/76  Pulse:  99 95 86  Resp:  18 18   Temp:  (!) 97.4 F (36.3 C) 97.9 F (36.6 C) 98.2 F (36.8 C)  TempSrc:    Oral  SpO2:  100% 98% 100%  Weight: 109.4 kg     Height: 6' 2 (1.88 m)      Physical Exam: Constitutional:Well-appearing, in no acute distress Cardiovascular: regular rate and rhythm, no murmur Pulmonary/Chest: normal work of breathing on room air Abdominal: soft, non-tender, non-distended. Bowel sounds present  Neurological: alert & oriented x 3 MSK: no gross abnormalities. LUE Avgraft has palpable thrill Skin: warm and dry Psych: Irritated     Latest Ref Rng & Units 02/01/2024    4:53 AM 01/31/2024    6:03 AM 12/16/2023   11:28 AM  CBC  WBC 4.0 - 10.5 K/uL 9.0  12.9  7.2   Hemoglobin 13.0 - 17.0 g/dL 85.2  82.7  86.1   Hematocrit 39.0 - 52.0 % 45.6  52.6  42.5   Platelets 150 - 400 K/uL 230  288  263         Latest Ref Rng & Units 02/01/2024    4:53 AM 01/31/2024    7:06 PM 01/31/2024    6:03 AM  BMP  Glucose 70 - 99 mg/dL 95  889  883   BUN 6 - 20 mg/dL 95  89  78   Creatinine 0.61 - 1.24 mg/dL 81.17  82.03  83.16   Sodium 135 - 145 mmol/L 136  136  137   Potassium 3.5 - 5.1 mmol/L 5.3  5.3  5.4   Chloride 98 - 111 mmol/L 97  97  91   CO2 22 - 32 mmol/L 17  16  20    Calcium  8.9 - 10.3 mg/dL 8.8  9.2  9.8      Assessment/Plan:  Principal Problem:   Dehydration Active Problems:   Hyperkalemia   Polycystic kidney disease   ESRD (end stage renal disease) (HCC)   Gastroenteritis   Hypotension  #Hypotension 2/2 Diarrhea #Dehydration   #Gastroenteritis  #Vomiting  #Leukocytosis  Hypotension has resolved. Diarrhea is  due to ETEC infection. He is HDS today and stable for discharge after HD. Plan: -Azithromycin 500 mg daily for 3 days -Psyllium TID -Zofran  4mg  Q8  #ESRD on HD MWF #PCKD #Hyperkalemia  Patient last had HD on Wednesday. Home after HD today Plan: -Nephrology consulted, appreciate recommendations   Resolved Problems:  __________________________________  Diet: Renal VTE: Heparin  Code: Full   Prior to Admission Living Arrangement: Home, living with self Anticipated Discharge Location: Home Barriers to Discharge: Resolution of hypotension     Kandis Perkins, DO 02/01/2024, 12:30 PM Please contact the on call pager at: [phone]

## 2024-02-01 NOTE — Care Management Obs Status (Signed)
 MEDICARE OBSERVATION STATUS NOTIFICATION   Patient Details  Name: Michael [name] MRN: [id number] Date of Birth: 1975/12/04   Medicare Observation Status Notification Given:  Yes    Robynn Eileen Hoose, RN 02/01/2024, 3:17 PM

## 2024-02-01 NOTE — Discharge Summary (Signed)
 Name: Michael [name] MRN: [id number] DOB: 01/09/1976 48 y.o. PCP: Patient, No Pcp Per  Date of Admission: 01/31/2024  5:27 AM Date of Discharge: 02/01/2024 11:10 AM Attending Physician: Dr. Eben  Discharge Diagnosis: Principal Problem:   Dehydration Active Problems:   Hyperkalemia   Polycystic kidney disease   ESRD (end stage renal disease) (HCC)   Gastroenteritis   Hypotension    Discharge Medications: Allergies as of 02/01/2024       Reactions   Ivp Dye [iodinated Contrast Media] Itching        Medication List     STOP taking these medications    gabapentin 300 MG capsule Commonly known as: NEURONTIN       TAKE these medications    acetaminophen  500 MG tablet Commonly known as: TYLENOL  Take 1,000 mg by mouth 2 (two) times daily as needed for headache, fever or moderate pain (pain score 4-6).   Auryxia 1 GM 210 MG(Fe) tablet Generic drug: ferric citrate Take 210-420 mg by mouth See admin instructions. Take 2 tablets (420mg ) by mouth twice daily with meals and take 1 tablet (210mg ) with snacks   azithromycin 500 MG tablet Commonly known as: ZITHROMAX Take 1 tablet (500 mg total) by mouth daily. Start taking on: February 02, 2024   lidocaine -prilocaine  cream Commonly known as: EMLA  Apply 1 Application topically every Monday, Wednesday, and Friday with hemodialysis.   Multivitamin Gummies Mens Chew Chew 2 each by mouth daily.   ondansetron  4 MG disintegrating tablet Commonly known as: ZOFRAN -ODT Take 1 tablet (4 mg total) by mouth every 8 (eight) hours as needed for nausea or vomiting.   psyllium 95 % Pack Commonly known as: HYDROCIL/METAMUCIL Take 1 packet by mouth 3 (three) times daily.        Disposition and follow-up:   [name] was discharged from [facility] in Stable condition.  At the hospital follow up visit please address:  1.  Follow-up:  *Hypotension 2/2 ETEC diarrhea -Assess if patient completed  azithromycin course, 3 day course  -Assess sx -Note that he was discharged home with zofran  and psyllium   *Hyperkalemia *ESRD -Repeat RFP -Ensure patient is going to HD per nephrologies schedule    2.  Labs / imaging needed at time of follow-up: RFP  3.  Pending labs/ test needing follow-up: N/A  4.  Medication Changes  STOPPED  -Gabapentin (patient was not taking)   ADDED  -Azithromycin 500 mg    -Psyllium  -Zofran    MODIFIED  -N/A  Hospital Course by problem list: #Hypotension 2/2 Diarrhea #Dehydration   #Gastroenteritis  #Vomiting  #Leukocytosis  Patient presented to the ED with sx of gastroenteritis. Due to his diarrhea and poor oral intake, he was hypotensive in the ED. His blood pressure normalized after IVF. GI panel revealed ETEC infection. Due to the diarrhea causing hypotension and admission, patient wa started on a 3 day course of azithromycin.   #ESRD on HD MWF #PCKD #Hyperkalemia 2/2 missed HD Patient last had HD on Wednesday prior to being admitted. He had HD on Saturday due to hyperkalemia. He received Lokelma  while admitted.    Discharge Subjective: Patient is no longer feeling dizzy but endorses diarrhea. His vomiting has gotten better. He feels that his bottom is sore from having diarrhea. He denied recent travel.  Discharge Exam:   Blood pressure 116/76, pulse 86, temperature 98.2 F (36.8 C), temperature source Oral, resp. rate 18, height 6' 2 (1.88 m), weight 109.4 kg, SpO2 100%.  Constitutional:Well-appearing, in no acute distress Cardiovascular: regular rate and rhythm, no murmur Pulmonary/Chest: normal work of breathing on room air Abdominal: soft, non-tender, non-distended. Bowel sounds present  Neurological: alert & oriented x 3 MSK: no gross abnormalities. LUE Avgraft has palpable thrill Skin: warm and dry Psych: Irritated  Pertinent Labs, Studies, and Procedures:     Latest Ref Rng & Units 02/01/2024    4:53 AM 01/31/2024     6:03 AM 12/16/2023   11:28 AM  CBC  WBC 4.0 - 10.5 K/uL 9.0  12.9  7.2   Hemoglobin 13.0 - 17.0 g/dL 85.2  82.7  86.1   Hematocrit 39.0 - 52.0 % 45.6  52.6  42.5   Platelets 150 - 400 K/uL 230  288  263        Latest Ref Rng & Units 02/01/2024    4:53 AM 01/31/2024    7:06 PM 01/31/2024   11:03 AM  CMP  Glucose 70 - 99 mg/dL 95  889    BUN 6 - 20 mg/dL 95  89    Creatinine 9.38 - 1.24 mg/dL 81.17  82.03    Sodium 135 - 145 mmol/L 136  136    Potassium 3.5 - 5.1 mmol/L 5.3  5.3    Chloride 98 - 111 mmol/L 97  97    CO2 22 - 32 mmol/L 17  16    Calcium  8.9 - 10.3 mg/dL 8.8  9.2    Total Protein 6.5 - 8.1 g/dL   8.0   Total Bilirubin 0.0 - 1.2 mg/dL   1.4   Alkaline Phos 38 - 126 U/L   54   AST 15 - 41 U/L   16   ALT 0 - 44 U/L   13     DG Chest 1 View Result Date: 01/31/2024 CLINICAL DATA:  Chest pain. EXAM: CHEST  1 VIEW COMPARISON:  12/16/2023 FINDINGS: Low volumes. Cardiopericardial silhouette is at upper limits of normal for size. The lungs are clear without focal pneumonia, edema, pneumothorax or pleural effusion. No acute bony abnormality. Telemetry leads overlie the chest. IMPRESSION: Low volume film without acute cardiopulmonary findings. Electronically Signed   By: Camellia Candle M.D.   On: 01/31/2024 07:12     Discharge Instructions: You came to the hospital for diarrhea and you were diagnosed with Enterotoxic ecoli.  We treated you with IV fluid and antibiotics.   *For your Diarrhea -We have started you on these following medications:  -Azithromycin: take one tablet each day  -Zofran  4mg , take one every 8 hours as needed for nausea   -Metamucil: take three times a day to help bulk your stools  -Drink plenty of water  and try to eat clear liquids such a broth or jello as well -Please see PCP in 7 to 10 days  *For your ESRD and high potassium -Continue to go to HD as scheduled   Follow-up appointments: Please visit your family doctor in 7 to 10 days  If you  have any questions or concerns please feel free to call: Internal medicine clinic at [phone]  If you have any of these following symptoms, please call us  or seek care at an emergency department: -Chest Pain -Difficulty Breathing -lightheaded or dizziness -Syncope (passing out) -Drooping of face -Slurred speech -Sudden weakness in your leg or arm -Fever -Chills   We are glad that you are feeling better, it was a pleasure to care for you!    Signed: Damien Lease DO [facility] - PGY2 02/01/2024, 11:10 AM   Please contact the on call pager at: [phone]

## 2024-02-01 NOTE — Discharge Instructions (Addendum)
  You came to the hospital for diarrhea and you were diagnosed with Enterotoxic ecoli.  We treated you with IV fluid and antibiotics.    *For your Diarrhea -We have started you on these following medications:  -Azithromycin: take one tablet each day  -Zofran  4mg , take one every 8 hours as needed for nausea   -Metamucil: take three times a day to help bulk your stools  -Drink plenty of water  and try to eat clear liquids such a broth or jello as well -Please see PCP in 7 to 10 days  *For your ESRD and high potassium -Continue to go to HD as scheduled   Follow-up appointments: Please visit your family doctor in 7 to 10 days  If you have any questions or concerns please feel free to call: Internal medicine clinic at [phone]   If you have any of these following symptoms, please call us  or seek care at an emergency department: -Chest Pain -Difficulty Breathing -lightheaded or dizziness -Syncope (passing out) -Drooping of face -Slurred speech -Sudden weakness in your leg or arm -Fever -Chills   We are glad that you are feeling better, it was a pleasure to care for you!  Damien Lease DO

## 2024-02-01 NOTE — Progress Notes (Signed)
 [facility] called in GI panel results   Enterotoxgenic Ecoli  positive internal Med notified awaiting call

## 2024-02-01 NOTE — Progress Notes (Signed)
 [facility] Progress Note   Subjective:   Did not want to come p to the HD unit for dialysis due to frequent bowel movements. Does agree to HD in his room later today. Denies SOB, CP, dizziness.   Objective Vitals:   01/31/24 1632 01/31/24 1643 01/31/24 1958 02/01/24 0540  BP:  101/75 107/66 116/76  Pulse:  99 95 86  Resp:  18 18   Temp:  (!) 97.4 F (36.3 C) 97.9 F (36.6 C) 98.2 F (36.8 C)  TempSrc:    Oral  SpO2:  100% 98% 100%  Weight: 109.4 kg     Height: 6' 2 (1.88 m)      Physical Exam General: Alert male in NAD Heart: RRR, no murmurs Lungs: CTA anteriorly, respirations unlabored Abdomen: non-distended Extremities: No edema b/l lower extremities Dialysis Access: LUE AVG   Additional Objective Labs: Basic Metabolic Panel: Recent Labs  Lab 01/31/24 0603 01/31/24 1906 02/01/24 0453  NA 137 136 136  K 5.4* 5.3* 5.3*  CL 91* 97* 97*  CO2 20* 16* 17*  GLUCOSE 116* 110* 95  BUN 78* 89* 95*  CREATININE 16.83* 17.96* 18.82*  CALCIUM  9.8 9.2 8.8*  PHOS  --   --  6.4*   Liver Function Tests: Recent Labs  Lab 01/31/24 0603 01/31/24 1103 02/01/24 0453  AST 12* 16  --   ALT 19 13  --   ALKPHOS 59 54  --   BILITOT 0.6 1.4*  --   PROT 9.7* 8.0  --   ALBUMIN  4.1 3.9 3.5   Recent Labs  Lab 01/31/24 0603  LIPASE 26   CBC: Recent Labs  Lab 01/31/24 0603 02/01/24 0453  WBC 12.9* 9.0  NEUTROABS 7.7  --   HGB 17.2* 14.7  HCT 52.6* 45.6  MCV 89.9 89.8  PLT 288 230   Blood Culture    Component Value Date/Time   SDES  04/13/2022 1412    URINE, CLEAN CATCH Performed at [facility], [address] [place], [place] [address]    SPECREQUEST  04/13/2022 1412    NONE Reflexed from [id number] Performed at [facility], [address] [place], [place] [address]    CULT  04/13/2022 1412    NO GROWTH Performed at [facility], [address], [place], [place] [address]    REPTSTATUS 04/14/2022 FINAL  04/13/2022 1412    Cardiac Enzymes: No results for input(s): CKTOTAL, CKMB, CKMBINDEX, TROPONINI in the last 168 hours. CBG: No results for input(s): GLUCAP in the last 168 hours. Iron Studies: No results for input(s): IRON, TIBC, TRANSFERRIN, FERRITIN in the last 72 hours. @lablastinr3 @ Studies/Results: DG Chest 1 View Result Date: 01/31/2024 CLINICAL DATA:  Chest pain. EXAM: CHEST  1 VIEW COMPARISON:  12/16/2023 FINDINGS: Low volumes. Cardiopericardial silhouette is at upper limits of normal for size. The lungs are clear without focal pneumonia, edema, pneumothorax or pleural effusion. No acute bony abnormality. Telemetry leads overlie the chest. IMPRESSION: Low volume film without acute cardiopulmonary findings. Electronically Signed   By: Camellia Candle M.D.   On: 01/31/2024 07:12   Medications:   azithromycin  500 mg Oral Daily   Chlorhexidine  Gluconate Cloth  6 each Topical Q0600   ferric citrate  420 mg Oral TID WC   heparin   5,000 Units Subcutaneous Q8H   multivitamin with minerals  1 tablet Oral Daily   psyllium  1 packet Oral TID    Dialysis Orders: [facility] MWF 4h  B550   110.6kg   AVG  Heparni 6500 + 1000 midrun Last OP HD 11/12, post wt 111.5kg Usually gets off 0-2 kg over Usual Hb is 12-13 range Bp's a bit high pre HD, and lowest bp is usually 100-110 range  Assessment/Plan: Acute diarrhea/ hypotension: suspected volume depletion from N/V/D the last 2-3 days. Received IV fluids, Bps better, no evidence of volume overload ESRD: on HD MWF. Has not missed HD but missed Friday. HD today Hyperkalemia: Received PO lokelma , K+ is stable. HD today. Does not like renal diet but reminded to avoid high potassium foods  BP: bp's soft initially, improved after IVFs. Is not on BP lowering meds at home.  Anemia of esrd: Hb 17 initially, likely concentrated due to dehydration. Now improved   Lucie Collet, PA-C 02/01/2024, 11:55 AM  [facility] Pager: [phone]

## 2024-02-02 ENCOUNTER — Other Ambulatory Visit (HOSPITAL_COMMUNITY): Payer: Self-pay

## 2024-02-02 LAB — RENAL FUNCTION PANEL
Albumin: 3.2 g/dL — ABNORMAL LOW (ref 3.5–5.0)
Anion gap: 22 — ABNORMAL HIGH (ref 5–15)
BUN: 77 mg/dL — ABNORMAL HIGH (ref 6–20)
CO2: 15 mmol/L — ABNORMAL LOW (ref 22–32)
Calcium: 8.6 mg/dL — ABNORMAL LOW (ref 8.9–10.3)
Chloride: 98 mmol/L (ref 98–111)
Creatinine, Ser: 16.47 mg/dL — ABNORMAL HIGH (ref 0.61–1.24)
GFR, Estimated: 3 mL/min — ABNORMAL LOW (ref >60)
Glucose, Bld: 89 mg/dL (ref 70–99)
Phosphorus: 7 mg/dL — ABNORMAL HIGH (ref 2.5–4.6)
Potassium: 4.5 mmol/L (ref 3.5–5.1)
Sodium: 135 mmol/L (ref 135–145)

## 2024-02-02 LAB — MAGNESIUM: Magnesium: 1.9 mg/dL (ref 1.7–2.4)

## 2024-02-02 MED ORDER — AZITHROMYCIN 500 MG PO TABS
500.0000 mg | ORAL_TABLET | Freq: Every day | ORAL | 0 refills | Status: DC
  Filled 2024-02-02: qty 1, 1d supply, fill #0

## 2024-02-02 NOTE — Discharge Summary (Signed)
 Name: Michael [name] MRN: [id number] DOB: September 27, 1975 48 y.o. PCP: Patient, No Pcp Per  Date of Admission: 01/31/2024  5:27 AM Date of Discharge: 02/02/2024 8:23 AM Attending Physician: Dr. Eben  Discharge Diagnosis: Principal Problem:   Dehydration Active Problems:   Hyperkalemia   Polycystic kidney disease   ESRD (end stage renal disease) (HCC)   Gastroenteritis   Hypotension    Discharge Medications: Allergies as of 02/02/2024       Reactions   Ivp Dye [iodinated Contrast Media] Itching        Medication List     STOP taking these medications    gabapentin 300 MG capsule Commonly known as: NEURONTIN       TAKE these medications    acetaminophen  500 MG tablet Commonly known as: TYLENOL  Take 1,000 mg by mouth 2 (two) times daily as needed for headache, fever or moderate pain (pain score 4-6).   Auryxia 1 GM 210 MG(Fe) tablet Generic drug: ferric citrate Take 210-420 mg by mouth See admin instructions. Take 2 tablets (420mg ) by mouth twice daily with meals and take 1 tablet (210mg ) with snacks   azithromycin 500 MG tablet Commonly known as: ZITHROMAX Take 1 tablet (500 mg total) by mouth daily.   lidocaine -prilocaine  cream Commonly known as: EMLA  Apply 1 Application topically every Monday, Wednesday, and Friday with hemodialysis.   Multivitamin Gummies Mens Chew Chew 2 each by mouth daily.   ondansetron  4 MG disintegrating tablet Commonly known as: ZOFRAN -ODT Take 1 tablet (4 mg total) by mouth every 8 (eight) hours as needed for nausea or vomiting.   psyllium 95 % Pack Commonly known as: HYDROCIL/METAMUCIL Take 1 packet by mouth 3 (three) times daily.         Disposition and follow-up:   [name] was discharged from [facility] in Stable condition.  At the hospital follow up visit please address:  1.  Follow-up:  *Hypotension 2/2 ETEC diarrhea -Assess if patient completed azithromycin course, 3 day course   -Assess sx -Note that he was discharged home with zofran  and psyllium   *Hyperkalemia *ESRD -Repeat RFP -Ensure patient is going to HD per nephrologies schedule    2.  Labs / imaging needed at time of follow-up: RFP  3.  Pending labs/ test needing follow-up: N/A  4.  Medication Changes  STOPPED  -Gabapentin (patient was not taking)   ADDED  -Azithromycin 500 mg    -Psyllium  -Zofran    MODIFIED  -N/A  Hospital Course by problem list: #Hypotension 2/2 Diarrhea #Dehydration   #Gastroenteritis  #Vomiting  #Leukocytosis  Patient presented to the ED with sx of gastroenteritis. Due to his diarrhea and poor oral intake, he was hypotensive in the ED. His blood pressure normalized after IVF. GI panel revealed ETEC infection. Due to the diarrhea causing hypotension and admission, patient wa started on a 3 day course of azithromycin.   #ESRD on HD MWF #PCKD #Hyperkalemia 2/2 missed HD Patient last had HD on Wednesday prior to being admitted. He had HD on Saturday due to hyperkalemia. He received Lokelma  while admitted.    Discharge Subjective: Pt reports that he is not dizzy, and his stools have solidified, and he feels comfortable to go home today.   Discharge Exam:   Blood pressure 116/76, pulse 86, temperature 98.2 F (36.8 C), temperature source Oral, resp. rate 18, height 6' 2 (1.88 m), weight 109.4 kg, SpO2 100%.    Constitutional:Well-appearing, in no acute distress Cardiovascular: regular rate and  rhythm, no murmur Pulmonary/Chest: normal work of breathing on room air Abdominal: soft, non-tender, non-distended. Bowel sounds present  Neurological: alert & oriented x 3 MSK: no gross abnormalities. LUE Avgraft has palpable thrill Skin: warm and dry Psych: irritated about food options at hospital  Pertinent Labs, Studies, and Procedures:     Latest Ref Rng & Units 02/01/2024    4:53 AM 01/31/2024    6:03 AM 12/16/2023   11:28 AM  CBC  WBC 4.0 - 10.5 K/uL 9.0   12.9  7.2   Hemoglobin 13.0 - 17.0 g/dL 85.2  82.7  86.1   Hematocrit 39.0 - 52.0 % 45.6  52.6  42.5   Platelets 150 - 400 K/uL 230  288  263        Latest Ref Rng & Units 02/02/2024    6:09 AM 02/01/2024    4:53 AM 01/31/2024    7:06 PM  CMP  Glucose 70 - 99 mg/dL 89  95  889   BUN 6 - 20 mg/dL 77  95  89   Creatinine 0.61 - 1.24 mg/dL 83.52  81.17  82.03   Sodium 135 - 145 mmol/L 135  136  136   Potassium 3.5 - 5.1 mmol/L 4.5  5.3  5.3   Chloride 98 - 111 mmol/L 98  97  97   CO2 22 - 32 mmol/L 15  17  16    Calcium  8.9 - 10.3 mg/dL 8.6  8.8  9.2     DG Chest 1 View Result Date: 01/31/2024 CLINICAL DATA:  Chest pain. EXAM: CHEST  1 VIEW COMPARISON:  12/16/2023 FINDINGS: Low volumes. Cardiopericardial silhouette is at upper limits of normal for size. The lungs are clear without focal pneumonia, edema, pneumothorax or pleural effusion. No acute bony abnormality. Telemetry leads overlie the chest. IMPRESSION: Low volume film without acute cardiopulmonary findings. Electronically Signed   By: Camellia Candle M.D.   On: 01/31/2024 07:12     Discharge Instructions: Discharge Instructions     Call MD for:  difficulty breathing, headache or visual disturbances   Complete by: As directed    Call MD for:  extreme fatigue   Complete by: As directed    Call MD for:  hives   Complete by: As directed    Call MD for:  persistant dizziness or light-headedness   Complete by: As directed    Call MD for:  persistant nausea and vomiting   Complete by: As directed    Call MD for:  redness, tenderness, or signs of infection (pain, swelling, redness, odor or green/yellow discharge around incision site)   Complete by: As directed    Call MD for:  severe uncontrolled pain   Complete by: As directed    Call MD for:  temperature >100.4   Complete by: As directed    Diet - low sodium heart healthy   Complete by: As directed    Discharge instructions   Complete by: As directed    You came to the  hospital for diarrhea and you were diagnosed with Enterotoxic ecoli.  We treated you with IV fluid and antibiotics.    *For your Diarrhea -We have started you on these following medications:  -Azithromycin: take one tablet each day  -Zofran  4mg , take one every 8 hours as needed for nausea   -Metamucil: take three times a day to help bulk your stools  -Drink plenty of water  and try to eat clear liquids such a broth or jello  as well -Please see PCP in 7 to 10 days  *For your ESRD and high potassium -Continue to go to HD as scheduled   Follow-up appointments: Please visit your family doctor in 7 to 10 days  If you have any questions or concerns please feel free to call: Internal medicine clinic at [phone]   If you have any of these following symptoms, please call us  or seek care at an emergency department: -Chest Pain -Difficulty Breathing -lightheaded or dizziness -Syncope (passing out) -Drooping of face -Slurred speech -Sudden weakness in your leg or arm -Fever -Chills   We are glad that you are feeling better, it was a pleasure to care for you!  Damien Lease DO   Increase activity slowly   Complete by: As directed        Signed: Penne Mori; DO - PGY1 [facility]  02/02/2024, 8:23 AM   Please contact the on call pager at: [phone]

## 2024-02-02 NOTE — Plan of Care (Signed)
?  Problem: Fluid Volume: ?Goal: Compliance with measures to maintain balanced fluid volume will improve ?Outcome: Progressing ?  ?Problem: Clinical Measurements: ?Goal: Complications related to the disease process, condition or treatment will be avoided or minimized ?Outcome: Progressing ?  ?

## 2024-02-02 NOTE — Progress Notes (Signed)
 [facility] Progress Note   Subjective:   Had partial HD yesterday due to diarrhea. Feeling much better today, is hoping to go home. Denies SOB, CP, dizziness.   Objective Vitals:   02/01/24 2050 02/01/24 2107 02/02/24 0442 02/02/24 0757  BP: 114/75  120/73 134/82  Pulse: 89  79 79  Resp: 18 16 18 19   Temp: 98.2 F (36.8 C)  (!) 97.5 F (36.4 C) (!) 97.3 F (36.3 C)  TempSrc: Oral  Oral Oral  SpO2: 99% 98% 97% 98%  Weight:      Height:       Physical Exam General: Alert male in NAD Heart: RRR, no murmurs Lungs: CTA anteriorly, respirations unlabored Abdomen: non-distended Extremities: No edema b/l lower extremities Dialysis Access: LUE AVG   Additional Objective Labs: Basic Metabolic Panel: Recent Labs  Lab 01/31/24 1906 02/01/24 0453 02/02/24 0609  NA 136 136 135  K 5.3* 5.3* 4.5  CL 97* 97* 98  CO2 16* 17* 15*  GLUCOSE 110* 95 89  BUN 89* 95* 77*  CREATININE 17.96* 18.82* 16.47*  CALCIUM  9.2 8.8* 8.6*  PHOS  --  6.4* 7.0*   Liver Function Tests: Recent Labs  Lab 01/31/24 0603 01/31/24 1103 02/01/24 0453 02/02/24 0609  AST 12* 16  --   --   ALT 19 13  --   --   ALKPHOS 59 54  --   --   BILITOT 0.6 1.4*  --   --   PROT 9.7* 8.0  --   --   ALBUMIN  4.1 3.9 3.5 3.2*   Recent Labs  Lab 01/31/24 0603  LIPASE 26   CBC: Recent Labs  Lab 01/31/24 0603 02/01/24 0453  WBC 12.9* 9.0  NEUTROABS 7.7  --   HGB 17.2* 14.7  HCT 52.6* 45.6  MCV 89.9 89.8  PLT 288 230   Blood Culture    Component Value Date/Time   SDES  04/13/2022 1412    URINE, CLEAN CATCH Performed at [facility], [address], [place], [place] [address]    SPECREQUEST  04/13/2022 1412    NONE Reflexed from [id number] Performed at [facility], [address] [place], [place] [address]    CULT  04/13/2022 1412    NO GROWTH Performed at [facility], [address], [place], [place] [address]    REPTSTATUS 04/14/2022 FINAL  04/13/2022 1412    Cardiac Enzymes: No results for input(s): CKTOTAL, CKMB, CKMBINDEX, TROPONINI in the last 168 hours. CBG: No results for input(s): GLUCAP in the last 168 hours. Iron Studies: No results for input(s): IRON, TIBC, TRANSFERRIN, FERRITIN in the last 72 hours. @lablastinr3 @ Studies/Results: No results found. Medications:  anticoagulant sodium citrate      azithromycin  500 mg Oral Daily   Chlorhexidine  Gluconate Cloth  6 each Topical Q0600   ferric citrate  420 mg Oral TID WC   heparin   5,000 Units Subcutaneous Q8H   heparin   5,500 Units Dialysis Once in dialysis   multivitamin with minerals  1 tablet Oral Daily   psyllium  1 packet Oral TID    Dialysis Orders:  [facility] MWF 4h  B550   110.6kg   AVG  Heparni 6500 + 1000 midrun Last OP HD 11/12, post wt 111.5kg Usually gets off 0-2 kg over Usual Hb is 12-13 range Bp's a bit high pre HD, and lowest bp is usually 100-110 range  Assessment/Plan: Acute diarrhea/ hypotension: suspected volume depletion from N/V/D  the last 2-3 days. Received IV fluids, Bps better, no evidence of volume overload ESRD: on HD MWF. Has not missed HD but missed Friday. Had partial HD yesterday, next HD tomorrow per regular schedule Hyperkalemia: Received PO lokelma , K+ is stable. HD today. Does not like renal diet but reminded to avoid high potassium foods  BP: bp's soft initially, improved after IVFs. Is not on BP lowering meds at home.  Anemia of esrd: Hb 17 initially, likely concentrated due to dehydration. Now improved   Lucie Collet, PA-C 02/02/2024, 10:23 AM  [facility] Pager: [phone]

## 2024-02-02 NOTE — Discharge Planning (Signed)
 [facility] Patient Discharge Orders- [facility] CLINIC: [facility]  Patient's name: Michael [name] Admit/DC Dates: 01/31/2024 - 02/02/2024  Discharge Diagnoses: Acute diarrhea   Hypotension  Aranesp: Given: no   Date and amount of last dose: N/A  Last Hgb: 14.7 PRBC's Given: no Date/# of units: N/A ESA dose for discharge: same dose IV Iron dose at discharge: same dose  Heparin  change: no  EDW Change: yes New EDW: 111.5kg  [place] Change: no  Access intervention/Change: no Details:  [name] /Calcitriol  change: no  Discharge Labs: Calcium  8.6 Phosphorus 7.0 Albumin  3.2 K+ 4.5  IV Antibiotics: no Details:  On Coumadin?: no Last INR: Next INR: Managed By:   OTHER/APPTS/LAB ORDERS:    D/C Meds to be reconciled by nurse after every discharge.  Completed By: Lucie Collet, PA-C 02/02/2024, 4:06 PM  [facility] Pager: [phone]    Reviewed by: MD:______ RN_______

## 2024-02-05 ENCOUNTER — Encounter: Payer: Self-pay | Admitting: Infectious Diseases

## 2024-02-05 DIAGNOSIS — Z789 Other specified health status: Secondary | ICD-10-CM | POA: Insufficient documentation

## 2024-02-05 LAB — HEPATITIS B SURFACE ANTIBODY, QUANTITATIVE
Hep B S AB Quant (Post): 26.3 m[IU]/mL
Hep B S AB Quant (Post): 24.7 m[IU]/mL

## 2024-02-17 ENCOUNTER — Ambulatory Visit: Admitting: Podiatry

## 2024-02-17 ENCOUNTER — Ambulatory Visit

## 2024-02-17 DIAGNOSIS — M7752 Other enthesopathy of left foot: Secondary | ICD-10-CM

## 2024-02-17 DIAGNOSIS — E1140 Type 2 diabetes mellitus with diabetic neuropathy, unspecified: Secondary | ICD-10-CM

## 2024-02-17 DIAGNOSIS — M79674 Pain in right toe(s): Secondary | ICD-10-CM

## 2024-02-17 DIAGNOSIS — M7751 Other enthesopathy of right foot: Secondary | ICD-10-CM

## 2024-02-17 DIAGNOSIS — B351 Tinea unguium: Secondary | ICD-10-CM | POA: Diagnosis not present

## 2024-02-17 DIAGNOSIS — M79675 Pain in left toe(s): Secondary | ICD-10-CM | POA: Diagnosis not present

## 2024-02-17 DIAGNOSIS — E1149 Type 2 diabetes mellitus with other diabetic neurological complication: Secondary | ICD-10-CM | POA: Diagnosis not present

## 2024-02-17 MED ORDER — GABAPENTIN 400 MG PO CAPS: 400.0000 mg | ORAL_CAPSULE | Freq: Three times a day (TID) | ORAL | 4 refills | Status: AC

## 2024-02-19 NOTE — Progress Notes (Signed)
 Subjective:   Patient ID: Michael [name], male   DOB: 48 y.o.   MRN: [id number]   HPI Patient presents with relatively poor health with elongated nailbeds 1-5 both feet cracking at times between his toes on dialysis and history of burning shooting pain in both feet.  Patient does have diabetes no longer smokes and tries to be active   Review of Systems  All other systems reviewed and are negative.       Objective:  Physical Exam Vitals and nursing note reviewed.  Constitutional:      Appearance: He is well-developed.  Pulmonary:     Effort: Pulmonary effort is normal.  Musculoskeletal:        General: Normal range of motion.  Skin:    General: Skin is warm.  Neurological:     Mental Status: He is alert.     Vascular mildly diminished but intact with patient noted to have diminishment sharp dull vibratory bilateral range of motion subtalar midtarsal joint adequate mild reduction muscle strength with patient found to have significant dryness of both feet with irritation and severely thickened nailbeds 1-5 both feet with moderate forefoot discomfort secondary to chronic inflammation.  Good digital perfusion well-oriented x 3     Assessment:  Inflammatory processes secondary to foot structure with mycotic nail infection 1-5 both feet that is somewhat tender with irritation of tissue in between the toes with patient on dialysis which is a big part of the drying pathology.     Plan:  H&P reviewed all conditions the importance of daily inspections of his feet and today I did discuss trying Vaseline with Saran wrap white socks before bed periodically and I debrided nailbeds 1-5 both feet no iatrogenic bleeding can be done on a routine basis and encouraged daily foot inspections  X-rays do indicate moderate arthritis no indication stress fracture or other bony pathology

## 2024-03-31 ENCOUNTER — Encounter (HOSPITAL_BASED_OUTPATIENT_CLINIC_OR_DEPARTMENT_OTHER): Payer: Self-pay

## 2024-04-22 ENCOUNTER — Encounter (HOSPITAL_BASED_OUTPATIENT_CLINIC_OR_DEPARTMENT_OTHER): Payer: Self-pay

## 2024-04-22 ENCOUNTER — Ambulatory Visit (INDEPENDENT_AMBULATORY_CARE_PROVIDER_SITE_OTHER)

## 2024-04-22 VITALS — BP 105/76 | HR 99 | Ht 74.0 in | Wt 259.7 lb

## 2024-04-22 DIAGNOSIS — Z7689 Persons encountering health services in other specified circumstances: Secondary | ICD-10-CM

## 2024-04-22 DIAGNOSIS — G4730 Sleep apnea, unspecified: Secondary | ICD-10-CM

## 2024-04-22 NOTE — Progress Notes (Signed)
 "   New Patient Office Visit  Subjective:   Michael [name] 05-15-1975 04/22/2024  Chief Complaint  Patient presents with   New Patient (Initial Visit)    Patient is here to establish care with PCP. States he needs a new CPAP machine.     HPI: Michael [name] presents today to establish care at [facility] at [facility]. Introduced to publishing rights manager role and practice setting with verbalized understanding by patient.  All questions answered.   Last PCP: 2025 Last annual physical: 2025 Concerns: See below   Discussed the use of AI scribe software for clinical note transcription with the patient, who gave verbal consent to proceed.  History of Present Illness  Recent Hospitalization: He was hospitalized for E. coli poisoning approximately five weeks ago, from November 14th to November 16th, 2025, at [facility]. He is seeking documentation of this hospitalization to assist with obtaining meal services.  Sleep Apnea: He uses a CPAP machine nightly. The machine is not functioning as effectively as it used to and is requesting a new machine.     The following portions of the patient's history were reviewed and updated as appropriate: past medical history, past surgical history, family history, social history, allergies, medications, and problem list.   Patient Active Problem List   Diagnosis Date Noted   Hepatitis B immune 02/05/2024   Dehydration 01/31/2024   Gastroenteritis 01/31/2024   Hypotension 01/31/2024   Mixed hyperlipidemia 07/10/2022   Wellness examination 07/10/2022   Insomnia 04/05/2022   Cerebral aneurysm 09/08/2020   Elevated blood-pressure reading, without diagnosis of hypertension 08/01/2020   SDH (subdural hematoma) (HCC) 07/19/2020   AKI (acute kidney injury) 12/07/2019   Uremia 12/02/2019   Chest pain 12/02/2019   Elevated troponin 12/02/2019   Anemia in CKD (chronic kidney disease) 12/02/2019   Adjustment  disorder with mixed anxiety and depressed mood 11/28/2019   Renal failure 11/25/2019   Hyperkalemia 11/25/2019   Normocytic anemia 11/25/2019   Metabolic acidosis, increased anion gap    Hematochezia    Chronic gout due to renal impairment without tophus 12/14/2018   Polycystic kidney disease 11/09/2016   ESRD (end stage renal disease) on dialysis (HCC) 11/09/2016   Observed sleep apnea 09/01/2016   Excessive daytime sleepiness 09/01/2016   Primary snoring 09/01/2016   Acute pyelonephritis 05/25/2016   Acute gouty arthritis 05/25/2016   Hematuria 05/25/2016   Essential hypertension, benign 05/25/2016   Body mass index (BMI) 34.0-34.9, adult 05/25/2016   Hypertensive crisis 05/23/2016   Adult polycystic kidney disease 05/23/2016   SIRS (systemic inflammatory response syndrome) (HCC) 05/23/2016   Past Medical History:  Diagnosis Date   Anemia in neoplastic disease    Compression of vein    Disorder of phosphorus metabolism    End stage renal disease (HCC) 12/11/2019   Essential hypertension, malignant    Familial lipoprotein deficiency    GERD (gastroesophageal reflux disease)    Gout    History of renal dialysis    M-W-F   HLD (hyperlipidemia)    Hypertension    Intracranial aneurysm    s/p coil embolization: right middle meningeal artery 09/08/20, pericallosal artery 09/22/20, right MCA 12/22/20   Iron  deficiency anemia, unspecified    Melanoma in situ of eyelid (HCC)    Nontraumatic acute subdural hemorrhage (HCC)    Obesity, unspecified    Pneumonia    Polycystic kidney disease    Pruritus of skin    SDH (subdural hematoma) (HCC)  subacute right SDH 08/23/20 CTA head   Secondary hyperparathyroidism of renal origin    Sleep apnea 07/29/2018   uses cpap nightly   Vitamin D  deficiency 11/2018   Past Surgical History:  Procedure Laterality Date   A/V FISTULAGRAM N/A 03/26/2023   Procedure: A/V Fistulagram;  Surgeon: Norine Manuelita LABOR, MD;  Location: [facility] INVASIVE CV LAB;   Service: Cardiovascular;  Laterality: N/A;   A/V FISTULAGRAM Left 05/28/2023   Procedure: A/V Fistulagram;  Surgeon: Norine Manuelita LABOR, MD;  Location: [facility] INVASIVE CV LAB;  Service: Cardiovascular;  Laterality: Left;   A/V SHUNT INTERVENTION Left 11/13/2023   Procedure: A/V SHUNT INTERVENTION;  Surgeon: Magda Debby SAILOR, MD;  Location: [facility] PV LAB;  Service: Cardiovascular;  Laterality: Left;   AV FISTULA PLACEMENT Left 03/24/2020   Procedure: INSERTION OF LEFT UPPER EXTREMITY ARTERIOVENOUS (AV) GORE-TEX GRAFT;  Surgeon: Magda Debby SAILOR, MD;  Location: [facility] OR;  Service: Vascular;  Laterality: Left;  PERIPHERAL NERVE BLOCK   BASCILIC VEIN TRANSPOSITION Left 12/11/2019   Procedure: 1ST STAGE BASILIC TRANSPOSITION OF LEFT ARM;  Surgeon: Eliza Lonni RAMAN, MD;  Location: [facility] OR;  Service: Vascular;  Laterality: Left;   BIOPSY  04/06/2021   Procedure: BIOPSY;  Surgeon: Eda Iha, MD;  Location: [facility];  Service: Gastroenterology;;   COLONOSCOPY WITH PROPOFOL  N/A 04/06/2021   Procedure: COLONOSCOPY WITH PROPOFOL ;  Surgeon: Eda Iha, MD;  Location: [facility];  Service: Gastroenterology;  Laterality: N/A;   COLONOSCOPY WITH PROPOFOL  N/A 05/29/2021   Procedure: COLONOSCOPY WITH PROPOFOL ;  Surgeon: Mansouraty, Aloha Raddle., MD;  Location: [facility];  Service: Gastroenterology;  Laterality: N/A;   COLONOSCOPY WITH PROPOFOL  N/A 06/28/2022   Procedure: COLONOSCOPY WITH PROPOFOL ;  Surgeon: Mansouraty, Aloha Raddle., MD;  Location: [facility];  Service: Gastroenterology;  Laterality: N/A;   DG AV DIALYSIS GRAFT DECLOT OR Left 05/17/2020   ENDOSCOPIC MUCOSAL RESECTION  05/29/2021   Procedure: ENDOSCOPIC MUCOSAL RESECTION;  Surgeon: Wilhelmenia Aloha Raddle., MD;  Location: [facility];  Service: Gastroenterology;;   HEMOSTASIS CLIP PLACEMENT  05/29/2021   Procedure: HEMOSTASIS CLIP PLACEMENT;  Surgeon: Wilhelmenia Aloha Raddle., MD;  Location: [facility];  Service: Gastroenterology;;    IR AV DIALY SHUNT INTRO NEEDLE/INTRACATH INITIAL W/PTA/IMG LEFT  05/01/2022   IR FLUORO GUIDE CV LINE RIGHT  12/09/2019   IR US  GUIDE VASC ACCESS LEFT  05/01/2022   IR US  GUIDE VASC ACCESS RIGHT  12/09/2019   LEFT HEART CATH AND CORONARY ANGIOGRAPHY N/A 12/09/2019   Procedure: LEFT HEART CATH AND CORONARY ANGIOGRAPHY;  Surgeon: Ladona Heinz, MD;  Location: [facility];  Service: Cardiovascular;  Laterality: N/A;   PERIPHERAL VASCULAR BALLOON ANGIOPLASTY Left 03/26/2023   Procedure: PERIPHERAL VASCULAR BALLOON ANGIOPLASTY;  Surgeon: Norine Manuelita LABOR, MD;  Location: [facility];  Service: Cardiovascular;  Laterality: Left;  left AVG 60% VA   POLYPECTOMY  04/06/2021   Procedure: POLYPECTOMY;  Surgeon: Eda Iha, MD;  Location: [facility];  Service: Gastroenterology;;   POLYPECTOMY  05/29/2021   Procedure: POLYPECTOMY;  Surgeon: Wilhelmenia Aloha Raddle., MD;  Location: [facility];  Service: Gastroenterology;;   POLYPECTOMY  06/28/2022   Procedure: POLYPECTOMY;  Surgeon: Wilhelmenia Aloha Raddle., MD;  Location: [facility];  Service: Gastroenterology;;   REVISION OF ARTERIOVENOUS GORETEX GRAFT Left 08/31/2021   Procedure: REDO LEFT UPPER ARM ARTERIOVENOUS GORETEX GRAFT;  Surgeon: Lanis Fonda BRAVO, MD;  Location: [facility] OR;  Service: Vascular;  Laterality: Left;  PERIPHERAL NERVE BLOCK   SUBMUCOSAL INJECTION  05/29/2021   Procedure:  SUBMUCOSAL INJECTION;  Surgeon: Wilhelmenia Aloha Raddle., MD;  Location: [facility];  Service: Gastroenterology;;  EPI   SUBMUCOSAL LIFTING INJECTION  05/29/2021   Procedure: SUBMUCOSAL LIFTING INJECTION;  Surgeon: Wilhelmenia Aloha Raddle., MD;  Location: [facility];  Service: Gastroenterology;;  Everlift   SUBMUCOSAL TATTOO INJECTION  04/06/2021   Procedure: SUBMUCOSAL TATTOO INJECTION;  Surgeon: Eda Iha, MD;  Location: [facility];  Service: Gastroenterology;;   UPPER EXTREMITY VENOGRAPHY Bilateral 08/24/2021   Procedure: UPPER  EXTREMITY VENOGRAPHY;  Surgeon: Gretta Lonni PARAS, MD;  Location: [facility];  Service: Cardiovascular;  Laterality: Bilateral;   VENOUS ANGIOPLASTY  05/28/2023   Procedure: VENOUS ANGIOPLASTY;  Surgeon: Norine Manuelita LABOR, MD;  Location: [facility];  Service: Cardiovascular;;  Venous Anastomosis; Intragraft   VENOUS ANGIOPLASTY  11/13/2023   Procedure: VENOUS ANGIOPLASTY;  Surgeon: Magda Debby SAILOR, MD;  Location: [facility];  Service: Cardiovascular;;  Venous Anastomosis   Family History  Problem Relation Age of Onset   Diabetes Mother    Ovarian cancer Mother    Bipolar disorder Father    Hypertension Father    Prostate cancer Father    Prostate cancer Paternal Grandfather    Polycystic kidney disease Neg Hx    Social History   Socioeconomic History   Marital status: Single    Spouse name: Not on file   Number of children: 1   Years of education: Not on file   Highest education level: Not on file  Occupational History   Occupation: [employer]   Occupation: disabled  Tobacco Use   Smoking status: Former    Current packs/day: 0.00    Average packs/day: 0.2 packs/day for 10.0 years (2.0 ttl pk-yrs)    Types: Cigarettes    Start date: 2005    Quit date: 2015    Years since quitting: 11.1   Smokeless tobacco: Never  Vaping Use   Vaping status: Never Used  Substance and Sexual Activity   Alcohol use: Not Currently    Comment: occasional wine   Drug use: No   Sexual activity: Yes  Other Topics Concern   Not on file  Social History Narrative   Not on file   Social Drivers of Health   Tobacco Use: Medium Risk (04/22/2024)   Patient History    Smoking Tobacco Use: Former    Smokeless Tobacco Use: Never    Passive Exposure: Not on file  Financial Resource Strain: Medium Risk (06/11/2023)   Overall Financial Resource Strain (CARDIA)    Difficulty of Paying Living Expenses: Somewhat hard  Food Insecurity: Food Insecurity Present (01/31/2024)   Epic     Worried About Programme Researcher, Broadcasting/film/video in the Last Year: Sometimes true    Ran Out of Food in the Last Year: Sometimes true  Transportation Needs: No Transportation Needs (01/31/2024)   Epic    Lack of Transportation (Medical): No    Lack of Transportation (Non-Medical): No  Physical Activity: Inactive (06/11/2023)   Exercise Vital Sign    Days of Exercise per Week: 0 days    Minutes of Exercise per Session: 0 min  Stress: Stress Concern Present (06/11/2023)   [employer] of Occupational Health - Occupational Stress Questionnaire    Feeling of Stress : To some extent  Social Connections: Moderately Isolated (01/31/2024)   Social Connection and Isolation Panel    Frequency of Communication with Friends and Family: Three times a week    Frequency of Social Gatherings with Friends and  Family: Once a week    Attends Religious Services: More than 4 times per year    Active Member of Clubs or Organizations: No    Attends Banker Meetings: Never    Marital Status: Never married  Intimate Partner Violence: Not At Risk (01/31/2024)   Epic    Fear of Current or Ex-Partner: No    Emotionally Abused: No    Physically Abused: No    Sexually Abused: No  Depression (PHQ2-9): Medium Risk (04/22/2024)   Depression (PHQ2-9)    PHQ-2 Score: 7  Alcohol Screen: Low Risk (05/14/2023)   Alcohol Screen    Last Alcohol Screening Score (AUDIT): 0  Housing: Low Risk (01/31/2024)   Epic    Unable to Pay for Housing in the Last Year: No    Number of Times Moved in the Last Year: 0    Homeless in the Last Year: No  Utilities: Not At Risk (01/31/2024)   Epic    Threatened with loss of utilities: No  Health Literacy: Adequate Health Literacy (05/14/2023)   B1300 Health Literacy    Frequency of need for help with medical instructions: Never   Outpatient Medications Prior to Visit  Medication Sig Dispense Refill   acetaminophen  (TYLENOL ) 500 MG tablet Take 1,000 mg by mouth 2 (two) times daily  as needed for headache, fever or moderate pain (pain score 4-6).     AURYXIA  1 GM 210 MG(Fe) tablet Take 210-420 mg by mouth See admin instructions. Take 2 tablets (420mg ) by mouth twice daily with meals and take 1 tablet (210mg ) with snacks     gabapentin  (NEURONTIN ) 400 MG capsule Take 1 capsule (400 mg total) by mouth 3 (three) times daily. 90 capsule 4   lidocaine -prilocaine  (EMLA ) cream Apply 1 Application topically every Monday, Wednesday, and Friday with hemodialysis.     Multiple Vitamins-Minerals (MULTIVITAMIN GUMMIES MENS) CHEW Chew 2 each by mouth daily.     psyllium (HYDROCIL/METAMUCIL) 95 % PACK Take 1 packet by mouth 3 (three) times daily. 42 each 0   azithromycin  (ZITHROMAX ) 500 MG tablet Take 1 tablet (500 mg total) by mouth daily. 1 tablet 0   ondansetron  (ZOFRAN -ODT) 4 MG disintegrating tablet Take 1 tablet (4 mg total) by mouth every 8 (eight) hours as needed for nausea or vomiting. (Patient not taking: Reported on 04/22/2024) 20 tablet 0   No facility-administered medications prior to visit.   Allergies[1]  ROS: A complete ROS was performed with pertinent positives/negatives noted in the HPI. The remainder of the ROS are negative.   Objective:   Today's Vitals   04/22/24 1457  BP: 105/76  Pulse: 99  SpO2: 96%  Weight: 259 lb 11.2 oz (117.8 kg)  Height: 6' 2 (1.88 m)    GENERAL: Well-appearing, in NAD. Well nourished. SKIN: Pink, warm and dry. No rash, lesion, ulceration, or ecchymoses. Dialysis fistula noted to left upper arm RESPIRATORY: Chest wall symmetrical. Respirations even and non-labored. Breath sounds clear to auscultation bilaterally.  CARDIAC: S1, S2 present, regular rate and rhythm without murmur or gallops. Peripheral pulses 2+ bilaterally.  MSK: Muscle tone and strength appropriate for age. Joints w/o tenderness, redness, or swelling.  EXTREMITIES: Without clubbing, cyanosis, or edema.  NEUROLOGIC: No motor or sensory deficits. Steady, even gait.  C2-C12 intact.  PSYCH/MENTAL STATUS: Alert, oriented x 3. Cooperative, appropriate mood and affect.    Health Maintenance Due  Topic Date Due   DTaP/Tdap/Td (1 - Tdap) Never done   COVID-19 Vaccine (3 -  Moderna risk series) 10/14/2019   Medicare Annual Wellness (AWV)  05/13/2024    No results found for any visits on 04/22/24.     Assessment & Plan:  1. Encounter to establish care with new doctor (Primary) Discussed role of NP and expectations of the Primary Care Clinic. Discussed medical, surgical, and family history.   2. Sleep apnea, unspecified type Discussed need for another referral as I do not know his CPAP settings and specifications for his needs. Discussed reaching out to PCP if any questions arise during this process. - Ambulatory referral to Sleep Studies   Patient to reach out to office if new, worrisome, or unresolved symptoms arise or if no improvement in patient's condition. Patient verbalized understanding and is agreeable to treatment plan. All questions answered to patient's satisfaction.    Return in about 4 weeks (around 05/20/2024) for annual physical (fasting labs same day).    Lauraine Almarie Angus DNP, FNP-C      [1]  Allergies Allergen Reactions   Ivp Dye [Iodinated Contrast Media] Itching   "

## 2024-04-22 NOTE — Patient Instructions (Addendum)
 I have placed a referral to sleep studies for them to order your CPAP so they are able to make sure your settings are specialized for your needs. You're welcome to go ahead and call them to try and get it set up sooner! If you have any issues please let me know.

## 2024-05-25 ENCOUNTER — Encounter (HOSPITAL_BASED_OUTPATIENT_CLINIC_OR_DEPARTMENT_OTHER)

## 2024-05-26 ENCOUNTER — Encounter (HOSPITAL_BASED_OUTPATIENT_CLINIC_OR_DEPARTMENT_OTHER): Payer: 59

## 2024-06-09 ENCOUNTER — Ambulatory Visit: Admitting: Pulmonary Disease
